# Patient Record
Sex: Female | Born: 1960
Health system: Southern US, Community
[De-identification: ages and names within clinical notes are randomized; demographics above are authoritative.]

## PROBLEM LIST (undated history)

## (undated) DIAGNOSIS — E785 Hyperlipidemia, unspecified: Secondary | ICD-10-CM

## (undated) DIAGNOSIS — Z87440 Personal history of urinary (tract) infections: Secondary | ICD-10-CM

## (undated) DIAGNOSIS — K589 Irritable bowel syndrome without diarrhea: Secondary | ICD-10-CM

## (undated) DIAGNOSIS — T7840XA Allergy, unspecified, initial encounter: Secondary | ICD-10-CM

## (undated) DIAGNOSIS — K2100 Gastro-esophageal reflux disease with esophagitis, without bleeding: Secondary | ICD-10-CM

## (undated) DIAGNOSIS — M8580 Other specified disorders of bone density and structure, unspecified site: Secondary | ICD-10-CM

## (undated) DIAGNOSIS — I10 Essential (primary) hypertension: Secondary | ICD-10-CM

## (undated) DIAGNOSIS — Z9289 Personal history of other medical treatment: Secondary | ICD-10-CM

## (undated) DIAGNOSIS — Z8639 Personal history of other endocrine, nutritional and metabolic disease: Secondary | ICD-10-CM

## (undated) DIAGNOSIS — I2510 Atherosclerotic heart disease of native coronary artery without angina pectoris: Secondary | ICD-10-CM

## (undated) DIAGNOSIS — R011 Cardiac murmur, unspecified: Secondary | ICD-10-CM

## (undated) DIAGNOSIS — K219 Gastro-esophageal reflux disease without esophagitis: Secondary | ICD-10-CM

## (undated) DIAGNOSIS — E10319 Type 1 diabetes mellitus with unspecified diabetic retinopathy without macular edema: Secondary | ICD-10-CM

## (undated) DIAGNOSIS — D649 Anemia, unspecified: Secondary | ICD-10-CM

## (undated) DIAGNOSIS — E119 Type 2 diabetes mellitus without complications: Secondary | ICD-10-CM

## (undated) DIAGNOSIS — N289 Disorder of kidney and ureter, unspecified: Secondary | ICD-10-CM

## (undated) DIAGNOSIS — N200 Calculus of kidney: Secondary | ICD-10-CM

## (undated) HISTORY — DX: Other specified disorders of bone density and structure, unspecified site: M85.80

## (undated) HISTORY — DX: Essential (primary) hypertension: I10

## (undated) HISTORY — DX: Gastro-esophageal reflux disease without esophagitis: K21.9

## (undated) HISTORY — DX: Disorder of kidney and ureter, unspecified: N28.9

## (undated) HISTORY — DX: Personal history of urinary (tract) infections: Z87.440

## (undated) HISTORY — DX: Gastro-esophageal reflux disease with esophagitis, without bleeding: K21.00

## (undated) HISTORY — DX: Irritable bowel syndrome, unspecified: K58.9

## (undated) HISTORY — DX: Allergy, unspecified, initial encounter: T78.40XA

## (undated) HISTORY — DX: Anemia, unspecified: D64.9

## (undated) HISTORY — DX: Hyperlipidemia, unspecified: E78.5

## (undated) HISTORY — PX: APPENDECTOMY: SHX54

## (undated) HISTORY — DX: Calculus of kidney: N20.0

## (undated) HISTORY — DX: Type 1 diabetes mellitus with unspecified diabetic retinopathy without macular edema: E10.319

## (undated) HISTORY — DX: Cardiac murmur, unspecified: R01.1

## (undated) HISTORY — DX: Type 2 diabetes mellitus without complications: E11.9

## (undated) HISTORY — DX: Personal history of other endocrine, nutritional and metabolic disease: Z86.39

---
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
## 1994-11-04 HISTORY — PX: ORIF FEMUR FRACTURE: SHX2119

## 1998-11-04 HISTORY — PX: PARTIAL HYSTERECTOMY: SHX80

## 2003-11-05 DIAGNOSIS — N200 Calculus of kidney: Secondary | ICD-10-CM

## 2003-11-05 HISTORY — DX: Calculus of kidney: N20.0

## 2004-08-13 ENCOUNTER — Emergency Department: Payer: Self-pay | Admitting: Emergency Medicine

## 2004-10-30 ENCOUNTER — Inpatient Hospital Stay: Payer: Self-pay

## 2005-03-17 ENCOUNTER — Inpatient Hospital Stay: Payer: Self-pay | Admitting: Internal Medicine

## 2005-03-17 ENCOUNTER — Other Ambulatory Visit: Payer: Self-pay

## 2008-12-06 ENCOUNTER — Ambulatory Visit: Payer: Self-pay | Admitting: Obstetrics and Gynecology

## 2009-08-18 LAB — HM DIABETES EYE EXAM: HM Diabetic Eye Exam: NORMAL

## 2009-10-18 LAB — CONVERTED CEMR LAB: Pap Smear: NORMAL

## 2009-12-07 ENCOUNTER — Ambulatory Visit: Payer: Self-pay | Admitting: Obstetrics and Gynecology

## 2009-12-19 LAB — HM MAMMOGRAPHY

## 2010-06-25 ENCOUNTER — Ambulatory Visit: Payer: Self-pay | Admitting: Internal Medicine

## 2010-06-25 DIAGNOSIS — E1059 Type 1 diabetes mellitus with other circulatory complications: Secondary | ICD-10-CM | POA: Insufficient documentation

## 2010-06-25 DIAGNOSIS — I10 Essential (primary) hypertension: Secondary | ICD-10-CM | POA: Insufficient documentation

## 2010-06-25 DIAGNOSIS — E103299 Type 1 diabetes mellitus with mild nonproliferative diabetic retinopathy without macular edema, unspecified eye: Secondary | ICD-10-CM

## 2010-06-25 DIAGNOSIS — E1043 Type 1 diabetes mellitus with diabetic autonomic (poly)neuropathy: Secondary | ICD-10-CM | POA: Insufficient documentation

## 2010-06-25 DIAGNOSIS — E1069 Type 1 diabetes mellitus with other specified complication: Secondary | ICD-10-CM | POA: Insufficient documentation

## 2010-06-25 DIAGNOSIS — E785 Hyperlipidemia, unspecified: Secondary | ICD-10-CM

## 2010-06-25 DIAGNOSIS — Z87442 Personal history of urinary calculi: Secondary | ICD-10-CM | POA: Insufficient documentation

## 2010-06-25 DIAGNOSIS — E1065 Type 1 diabetes mellitus with hyperglycemia: Secondary | ICD-10-CM | POA: Insufficient documentation

## 2010-06-25 DIAGNOSIS — K3184 Gastroparesis: Secondary | ICD-10-CM

## 2010-11-04 DIAGNOSIS — M8580 Other specified disorders of bone density and structure, unspecified site: Secondary | ICD-10-CM

## 2010-11-04 HISTORY — DX: Other specified disorders of bone density and structure, unspecified site: M85.80

## 2010-12-04 NOTE — Assessment & Plan Note (Signed)
Summary: NEW PT TO ESTABH/DLO   Vital Signs:  Patient profile:   50 year old female Height:      64 inches Weight:      126 pounds BMI:     21.71 Temp:     98.3 degrees F oral Pulse rate:   74 / minute Pulse rhythm:   regular BP sitting:   112 / 62  (left arm) Cuff size:   regular  Vitals Entered By: Selena Batten Dance CMA Duncan Dull) (June 25, 2010 9:35 AM) CC: New patient to establish care   History of Present Illness: CC: establish  Endocrinologist - Dr. Zenaida Niece in [facility] OB/GYN - Dr. Sharmon Revere OBGYN Podiatrist  T1DM - Lantus 8AM/6PM two times a day, Novolog carb counting scale 30gm carb: 1 unit insulin (takes after meals).  Last A1c 7.1%.  Did have low 42 (2 wks ago), b/c got busy and forgot to eat.  Does have plan for hypoglycemia.    preventative updated and reviewed.  -  Date:  12/19/2009    Mammogram ok per patient  Date:  10/18/2009    PAP normal per patient  Date:  08/18/2009    Diabetes Eye Exam normal per patient  Date:  04/19/2003    Pneumovax done  Current Medications (verified): 1)  Aspirin 81 Mg Tbec (Aspirin) .Marland Kitchen.. 1 By Mouth Once Daily 2)  Crestor 5 Mg Tabs (Rosuvastatin Calcium) .Marland Kitchen.. 1 By Mouth Once Daily 3)  Quinapril Hcl 10 Mg Tabs (Quinapril Hcl) .Marland Kitchen.. 1 By Mouth Once Daily 4)  Novolog 100 Unit/ml Soln (Insulin Aspart) .... Sliding Scale 5)  Lantus Solostar 100 Unit/ml Soln (Insulin Glargine) .... 8 U Am, 6 U Pm 6)  Biotin 1000 Mcg Tabs (Biotin) .Marland Kitchen.. 1 By Mouth Once Daily 7)  Multivitamins  Tabs (Multiple Vitamin) .Marland Kitchen.. 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  Review of Systems       The patient complains of headaches.  The patient denies anorexia, fever, weight loss, weight gain, vision loss, decreased hearing, hoarseness, chest pain, syncope, dyspnea on exertion, peripheral edema, prolonged cough, hemoptysis, abdominal pain, melena, hematochezia, severe indigestion/heartburn, hematuria, incontinence, muscle weakness, suspicious skin lesions, difficulty walking, depression, and breast masses.         + sore legs, only at night when on couch, vit D helped  Physical Exam  General:  Well-developed,well-nourished,in no acute distress; alert,appropriate and cooperative throughout examination.  vitals reviewed and normal. Head:  Normocephalic and atraumatic without obvious abnormalities. No apparent alopecia or balding. Eyes:  No corneal or conjunctival inflammation noted. EOMI. Perrla.  Mouth:  Oral mucosa and oropharynx without lesions or exudates.  Teeth in good repair. Neck:  No deformities, masses, or tenderness noted.  no thyromegaly, no LAD Lungs:  Normal respiratory effort, chest expands symmetrically. Lungs are clear to auscultation, no crackles or wheezes. Heart:  Normal rate and regular rhythm. S1 and S2 normal  without gallop, murmur, click, rub or other extra sounds. Abdomen:  Bowel sounds positive,abdomen soft and non-tender without masses, organomegaly or hernias noted. Msk:  No deformity or scoliosis noted of thoracic or lumbar spine.   Pulses:  2+ periph pulses (rad, DP, PT) Extremities:  No clubbing, cyanosis, edema, or deformity noted with normal full range of motion of all joints.   Neurologic:  No cranial nerve deficits noted. Station and gait are normal. Sensory, motor and coordinative functions appear intact. Skin:   Intact without suspicious lesions or rashes   Impression & Recommendations:  Problem # 1:  DIABETES MELLITUS, TYPE I (ICD-250.01) controlled as evidenced by last A1c 7.1%.  UTD on preventatives.  off pump.  followed by Dr. Vedia Coffer.  due for foot exam next visit.  Her updated medication list for this problem includes:    Aspirin 81 Mg Tbec (Aspirin) .Marland Kitchen... 1 by mouth once daily    Quinapril Hcl 10 Mg Tabs (Quinapril hcl) .Marland Kitchen... 1 by mouth once daily    Novolog 100 Unit/ml Soln (Insulin aspart) ..... Sliding scale    Lantus Solostar 100 Unit/ml Soln (Insulin glargine) .Marland Kitchen... 8 u am, 6 u pm  Last Eye Exam: normal per patient (08/18/2009)  Problem # 2:  GASTROPARESIS (ICD-536.3) stable per patient.  Problem # 3:  HYPERLIPIDEMIA (ICD-272.4) controlled per patient.  to obtain latest blood work. Her updated medication list for this problem includes:    Crestor 5 Mg Tabs (Rosuvastatin calcium) .Marland Kitchen... 1 by mouth once daily  Problem # 4:  ESSENTIAL HYPERTENSION (ICD-401.9) controlled.   Her updated medication list for this problem includes:    Quinapril Hcl 10 Mg Tabs (Quinapril hcl) .Marland Kitchen... 1 by mouth once daily  BP today: 112/62  Problem # 5:  Preventive Health Care (ICD-V70.0) preventative updated.  UTD on pap, mammo.  due for tetanus, says will obtain next visit.  Complete Medication List: 1)  Aspirin 81 Mg Tbec (Aspirin) .Marland Kitchen.. 1 by mouth once daily 2)  Crestor 5 Mg Tabs (Rosuvastatin calcium) .Marland Kitchen.. 1 by mouth once daily 3)  Quinapril Hcl 10 Mg Tabs (Quinapril hcl) .Marland Kitchen.. 1 by mouth once daily 4)  Novolog 100 Unit/ml Soln (Insulin aspart) .... Sliding scale 5)  Lantus Solostar 100 Unit/ml Soln (Insulin glargine) .... 8 u am, 6 u pm 6)  Biotin 1000 Mcg Tabs (Biotin) .Marland Kitchen.. 1 by mouth once daily 7)  Multivitamins Tabs (Multiple vitamin) .Marland Kitchen.. 1 by mouth once daily  Patient Instructions: 1)  Return as needed or in a year. 2)  Tetanus shot next visit. 3)  We will obtain records from Dr. Maia Plan  office for your latest blood work. 4)  Pleasure to meet you today.  Call clinic with quesitons  Current Allergies (reviewed today): No known allergies    Prevention & Chronic Care Immunizations   Influenza vaccine: Not documented    Tetanus booster: Not documented   Td booster deferral: Refused  (06/25/2010)    Pneumococcal vaccine: done  (04/19/2003)   Pneumococcal vaccine due: 04/18/2008  Other Screening   Pap smear: normal per patient  (10/18/2009)   Pap smear due: 10/18/2012    Mammogram: ok per patient  (12/19/2009)   Mammogram due: 12/19/2010   Smoking status: Not documented  Diabetes Mellitus   HgbA1C: Not documented    Eye exam: normal per patient  (08/18/2009)    Foot exam: Not documented   High risk foot: Not documented   Foot care education: Not documented    Urine microalbumin/creatinine  ratio: Not documented    Diabetes flowsheet reviewed?: Yes   Progress toward A1C goal: At goal  Lipids   Total Cholesterol: Not documented   LDL: Not documented   LDL Direct: Not documented   HDL: Not documented   Triglycerides: Not documented    SGOT (AST): Not documented   SGPT (ALT): Not documented   Alkaline phosphatase: Not documented   Total bilirubin: Not documented    Lipid flowsheet reviewed?: Yes   Progress toward LDL goal: Unchanged  Hypertension   Last Blood Pressure: 112 / 62  (06/25/2010)   Serum creatinine: Not documented   Serum potassium Not documented    Hypertension flowsheet reviewed?: Yes   Progress toward BP goal: At goal  Self-Management Support :   Personal Goals (by the next clinic visit) :     Personal A1C goal: 7  (06/25/2010)     Personal blood pressure goal: 130/80  (06/25/2010)     Personal LDL goal: 100  (06/25/2010)    Diabetes self-management support: Not documented    Hypertension self-management support: Not documented    Lipid self-management support: Not documented

## 2010-12-05 HISTORY — PX: TRIGGER FINGER RELEASE: SHX641

## 2010-12-12 ENCOUNTER — Ambulatory Visit: Payer: Self-pay | Admitting: Obstetrics and Gynecology

## 2010-12-24 ENCOUNTER — Ambulatory Visit (HOSPITAL_BASED_OUTPATIENT_CLINIC_OR_DEPARTMENT_OTHER)
Admission: RE | Admit: 2010-12-24 | Discharge: 2010-12-24 | Disposition: A | Discharge status: Home / Self Care | Payer: PRIVATE HEALTH INSURANCE | Attending: Orthopedic Surgery | Admitting: Orthopedic Surgery

## 2010-12-24 DIAGNOSIS — M6530 Trigger finger, unspecified finger: Secondary | ICD-10-CM | POA: Insufficient documentation

## 2010-12-24 DIAGNOSIS — E119 Type 2 diabetes mellitus without complications: Secondary | ICD-10-CM | POA: Insufficient documentation

## 2010-12-24 LAB — POCT I-STAT 4, (NA,K, GLUC, HGB,HCT)
Glucose, Bld: 197 mg/dL — ABNORMAL HIGH (ref 70–99)
HCT: 40 % (ref 36.0–46.0)
Hemoglobin: 13.6 g/dL (ref 12.0–15.0)
Potassium: 4.4 mEq/L (ref 3.5–5.1)
Sodium: 140 mEq/L (ref 135–145)

## 2010-12-24 LAB — GLUCOSE, CAPILLARY: Glucose-Capillary: 143 mg/dL — ABNORMAL HIGH (ref 70–99)

## 2010-12-28 NOTE — Op Note (Signed)
  [name], [name]                ACCOUNT NO.:  [id number]  MEDICAL RECORD NO.:  [id number]          PATIENT TYPE:  AMB  LOCATION:  [facility]                         FACILITY:  [facility]  PHYSICIAN:  Marlowe Kays, M.D.  DATE OF BIRTH:  May 18, 1961  DATE OF PROCEDURE: DATE OF DISCHARGE:                              OPERATIVE REPORT   PREOPERATIVE DIAGNOSIS:  Left trigger thumb.  POSTOPERATIVE DIAGNOSIS:  Left trigger thumb.  OPERATION:  Release A1 pulley, left thumb.  SURGEON:  Marlowe Kays, M.D.  ASSISTANT:  Nurse.  ANESTHESIA:  IV regional.  PATHOLOGICAL INDICATION FOR PROCEDURE:  She is a diabetic, has not responded to steroid injection at the A1 pulley and continues to have pain and catching.  PROCEDURE:  Satisfactory IV regional anesthesia with DuraPrep from midforearm to fingertips was draped in sterile field.  Time-out performed.  I made a horizontal triangle-type incision with the apex on the ulnar side.  The digital nerve was identified on the ulnar side along with the digital vessel and protected.  With a Glorious Peach, I removed soft tissue from over the A1 pulley which I opened slightly with a 15- knife blade and then released the tendon sheath proximal ward.  I then worked distally under direct visualization, incising the A1 pulley and actually removing a small section of it working distally until there was no longer any adhesion to the tendon.  There was a spot on the tendon where it had become stuck down.  At the conclusion of the case, I could place a hemostat, both proximally and distally along the tendon with no visible compression and passively, the tendons glided easily on flexion and extension of the thumb.  The wound was irrigated with sterile saline.  Skin, subcutaneous tissue only closed with interrupted 4-0 nylon mattress sutures. Betadine, Adaptic, dry sterile dressing were applied.  At the time of this dictation, she was on her way to recovery room in  satisfactory condition with no known complications.          ______________________________ Marlowe Kays, M.D.     JA/MEDQ  D:  12/24/2010  T:  12/24/2010  Job:  [id number]  Electronically Signed by Marlowe Kays M.D. on 12/28/2010 04:22:40 PM

## 2011-01-21 ENCOUNTER — Encounter: Payer: Self-pay | Admitting: Family Medicine

## 2011-01-21 ENCOUNTER — Ambulatory Visit (INDEPENDENT_AMBULATORY_CARE_PROVIDER_SITE_OTHER): Payer: PRIVATE HEALTH INSURANCE | Admitting: Family Medicine

## 2011-01-21 DIAGNOSIS — J019 Acute sinusitis, unspecified: Secondary | ICD-10-CM

## 2011-01-31 NOTE — Assessment & Plan Note (Signed)
Summary: ?SINUS INFECTION/CLE  WELLPATH   Vital Signs:  Patient profile:   50 year old female Weight:      121.25 pounds Temp:     98.1 degrees F oral Pulse rate:   84 / minute Pulse rhythm:   regular BP sitting:   132 / 78  (left arm) Cuff size:   regular  Vitals Entered By: Selena Batten Dance CMA Duncan Dull) (January 21, 2011 11:17 AM) CC: ? Sinus infection   History of Present Illness: CC: Sinus infx?  5d h/o pressure in throat, ears, ST, 3d later feeling much worse.  Lots of drainage down throat.  Lots of coughing worse at night.  As long as taking advil, throat tolerable.  When blowing nose, mostly clear, some yellow mucous.  Has tried advil cold/sinus and chloraseptic spray.  Unable to breath out of right side.  No sinus pressure.  No HA.  more congestion with bending head forward.  doesn't tend to get sinus infections.  This has been going around office.    No smokers at home.    h/o T1DM, sugars have been running elevated in high 100s since ill.  Current Medications (verified): 1)  Aspirin 81 Mg Tbec (Aspirin) .Marland Kitchen.. 1 By Mouth Once Daily 2)  Crestor 5 Mg Tabs (Rosuvastatin Calcium) .Marland Kitchen.. 1 By Mouth Once Daily 3)  Quinapril Hcl 10 Mg Tabs (Quinapril Hcl) .Marland Kitchen.. 1 By Mouth Once Daily 4)  Novolog 100 Unit/ml Soln (Insulin Aspart) .... Sliding Scale 5)  Lantus Solostar 100 Unit/ml Soln (Insulin Glargine) .... 8 U Am, 6 U Pm 6)  Biotin 1000 Mcg Tabs (Biotin) .Marland Kitchen.. 1 By Mouth Once Daily 7)  Multivitamins  Tabs (Multiple Vitamin) .Marland Kitchen.. 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  Past  Surgical History: T&A (1975) C/S (1985) L leg fx repaired (1996) total hysterectomy (2000) (paps by [facility]) L thumb trigger finger release (12/2010)  Review of Systems       per HPI  Physical Exam  General:  Well-developed,well-nourished,in no acute distress; alert,appropriate and cooperative throughout examination.  vitals reviewed and normal. Head:  Normocephalic and atraumatic without obvious abnormalities. No apparent alopecia or balding.  mild sinus tenderness maxillary >frontal Eyes:  No corneal or conjunctival inflammation noted. EOMI. Perrla.  Ears:  TMs clear bilaterally Nose:  nares congested bilaterally Mouth:  MMM, no pharyngeal erythema/exudates Neck:  No deformities, masses, or tenderness noted.  no thyromegaly, no LAD Lungs:  Normal respiratory effort, chest expands symmetrically. Lungs are clear to auscultation, no crackles or wheezes. Heart:  Normal rate and regular rhythm. S1 and S2 normal without gallop, murmur, click, rub or other extra sounds. Pulses:  2+ rad pulses    Impression & Recommendations:  Problem # 1:  SINUSITIS, ACUTE (ICD-461.9) likely viral.  supportive care as instructions, flonase for inflammation.  zpack to hold on to in case worsening or not improving as expected given comorbidity of diabetes.  Her updated medication list for this problem includes:    Zithromax Z-pak 250 Mg Tabs (Azithromycin) ..... Use as directed    Flonase 50 Mcg/act  Susp (Fluticasone propionate) .Marland Kitchen... 2 squirts in each nostril daily  Complete Medication List: 1)  Aspirin 81 Mg Tbec (Aspirin) .Marland Kitchen.. 1 by mouth once daily 2)  Crestor 5 Mg Tabs (Rosuvastatin calcium) .Marland Kitchen.. 1 by mouth once daily 3)  Quinapril Hcl 10 Mg Tabs (Quinapril hcl) .Marland Kitchen.. 1 by mouth once daily 4)  Novolog 100 Unit/ml Soln (Insulin aspart) .... Sliding scale 5)  Lantus Solostar 100 Unit/ml Soln (Insulin glargine) .... 8 u am, 6 u pm 6)  Biotin 1000 Mcg Tabs (Biotin) .Marland Kitchen.. 1 by mouth once  daily 7)  Multivitamins Tabs (Multiple vitamin) .Marland Kitchen.. 1 by mouth once daily 8)  Zithromax Z-pak 250 Mg Tabs (Azithromycin) .... Use as directed 9)  Flonase 50 Mcg/act Susp (Fluticasone propionate) .... 2 squirts in each nostril daily  Patient Instructions: 1)  You have a sinus infection, likely viral. 2)  Take medicines as prescribed: azihtormycin to hold on to in case not improving. 3)  Take guaifenesin 400mg  IR 1 1/2 pills in am and at noon with plenty of fluid to help mobilize mucous. 4)  Use nasal saline spray or neti pot to help drainage of sinuses. 5)  nasal steroid for next 1-2 wks.  flonase 2 squrits into each nostril daily.  usually takes about 1 wk to kick in. 6)  If you start having fevers >101.5, trouble swallowing or breathing, or are worsening instead of improving as expected, you may need to be seen again. 7)  Good to see you today, call clinic with questions.  Prescriptions: FLONASE 50 MCG/ACT SUSP (FLUTICASONE PROPIONATE) 2 squirts in each nostril daily  #1 x 3   Entered and Authorized by:   [name]  MD   Signed by:   [name]  MD on 01/21/2011   Method used:   Electronically to        [id number] (retail)       [address] [place], [place]  [address]       Ph: [phone] or [phone]       Fax: [phone]   RxID:   [id number] ZITHROMAX Z-PAK 250 MG TABS (AZITHROMYCIN) use as directed  #1 x 0   Entered and Authorized by:   [name]  MD   Signed by:   [name]  MD on 01/21/2011   Method used:   Print then Give to Patient   RxID:   [id number]    Orders Added: 1)  Est. Patient Level III [87564]    Current Allergies (reviewed today): No known allergies

## 2011-04-18 ENCOUNTER — Ambulatory Visit (INDEPENDENT_AMBULATORY_CARE_PROVIDER_SITE_OTHER): Payer: PRIVATE HEALTH INSURANCE | Admitting: Family Medicine

## 2011-04-18 ENCOUNTER — Encounter: Payer: Self-pay | Admitting: Family Medicine

## 2011-04-18 VITALS — BP 110/70 | HR 76 | Temp 97.7°F | Wt 122.1 lb

## 2011-04-18 DIAGNOSIS — H9201 Otalgia, right ear: Secondary | ICD-10-CM

## 2011-04-18 DIAGNOSIS — H9209 Otalgia, unspecified ear: Secondary | ICD-10-CM

## 2011-04-18 MED ORDER — AMOXICILLIN 500 MG PO TABS: 500.0000 mg | ORAL_TABLET | Freq: Two times a day (BID) | ORAL | Status: AC

## 2011-04-18 NOTE — Progress Notes (Signed)
  Subjective:    Patient ID: Felicia [name], female    DOB: 11/12/1960, 50 y.o.   MRN: [id number]  HPI CC: R ear pain  Pleasant T1DM with 1d h/o R ear pain, worse with touching (inside and out).  Feels swollen.  Tried tylenol which helps dull pain.  Tried warm compresses which helps.  Feels like "cotton stuffed inside ear".  No draining from hear.  No fevers/chills, no hearing loss, no tinnitus.  No nausea, dizziness.    No recent swimming.no recent travel on airplane.  States sugars controlled.  Review of Systems Per HPI    Objective:   Physical Exam  Nursing note and vitals reviewed. Constitutional: She appears well-developed and well-nourished. No distress.  HENT:  Head: Normocephalic and atraumatic.  Right Ear: Hearing and external ear normal. Tympanic membrane is not perforated.  Left Ear: Hearing, tympanic membrane, external ear and ear canal normal.  Nose: Nose normal.  Mouth/Throat: Oropharynx is clear and moist. No oropharyngeal exudate.       R TM - some erythema of vessels, dull reflex, no insufflation, retracted, cloudy yellow fluid behind tm.  External canal with mild edema, no irritation/maceration  Eyes: Conjunctivae and EOM are normal. Pupils are equal, round, and reactive to light. No scleral icterus.  Neck: Normal range of motion. Neck supple. No thyromegaly present.  Lymphadenopathy:       Head (right side): No submental, no submandibular, no tonsillar, no preauricular, no posterior auricular and no occipital adenopathy present.       Head (left side): No submental, no submandibular, no tonsillar, no preauricular, no posterior auricular and no occipital adenopathy present.    She has no cervical adenopathy.          Assessment & Plan:

## 2011-04-18 NOTE — Patient Instructions (Signed)
Your right ear drum looks irritated, possible early infection. Treat with antibiotic twice daily for 7 days Tylenol for discomfort. Push water and rest. Update [place] if not improving as expected. Good to see you today.

## 2011-04-18 NOTE — Assessment & Plan Note (Signed)
Possible early R AOM with effusion. Treat with amox bid x 7 days. Tylenol for pain Update [place] if not improved.

## 2011-07-03 ENCOUNTER — Encounter: Payer: Self-pay | Admitting: Family Medicine

## 2011-07-03 ENCOUNTER — Ambulatory Visit (INDEPENDENT_AMBULATORY_CARE_PROVIDER_SITE_OTHER): Payer: PRIVATE HEALTH INSURANCE | Admitting: Family Medicine

## 2011-07-03 VITALS — BP 124/74 | HR 76 | Temp 97.9°F | Wt 124.5 lb

## 2011-07-03 DIAGNOSIS — M79604 Pain in right leg: Secondary | ICD-10-CM | POA: Insufficient documentation

## 2011-07-03 DIAGNOSIS — R252 Cramp and spasm: Secondary | ICD-10-CM

## 2011-07-03 LAB — BASIC METABOLIC PANEL
BUN: 17 mg/dL (ref 6–23)
CO2: 25 mEq/L (ref 19–32)
Calcium: 9.1 mg/dL (ref 8.4–10.5)
Chloride: 102 mEq/L (ref 96–112)
Creat: 0.9 mg/dL (ref 0.50–1.10)
Glucose, Bld: 165 mg/dL — ABNORMAL HIGH (ref 70–99)
Potassium: 5.1 mEq/L (ref 3.5–5.3)
Sodium: 138 mEq/L (ref 135–145)

## 2011-07-03 LAB — MAGNESIUM: Magnesium: 2 mg/dL (ref 1.5–2.5)

## 2011-07-03 LAB — VITAMIN B12: Vitamin B-12: 633 pg/mL (ref 211–911)

## 2011-07-03 LAB — CK: Total CK: 67 U/L (ref 7–177)

## 2011-07-03 NOTE — Assessment & Plan Note (Signed)
No evidence of DVT on exam today. Does have calf tenderness to palpation. Check K, Mg and CK (on statin).  If normal, check dopplers to help r/o DVT. If normal, rec start tonic water or jogging in a jug. No sxs claudication or skin changes suggestive of CVI.

## 2011-07-03 NOTE — Progress Notes (Signed)
Addended by: Liane Comber C on: 07/03/2011 11:07 AM   Modules accepted: Orders

## 2011-07-03 NOTE — Patient Instructions (Signed)
We will check blood work to day - potassium and muscle function. If normal, we will set you up with ultrasound of the legs. If normal, recommend trial of tonic water at night or "jogging in a jug".

## 2011-07-03 NOTE — Progress Notes (Signed)
  Subjective:    Patient ID: Felicia [name], female    DOB: [birth year], 50 y.o.   MRN: [id number]  HPI CC: leg pain  Going on for years.  Started more when sitting in car for prolonged period.  Feels charlie horse sensation/cramp in calves.  R>L.  Worse in evenings when sitting down to eat dinner.  Legs start cramping, feels like has to keep moving legs.  Very achey first thing in am.  Asper cream improves.  Walking on treadmill also helped some.  Has tried different shoes, better arch support, hasn't helped.  Becoming more constant pain.  No fevers/chills, no leg swelling or redness.  No claudication sxs.  Not on hormonal history.  Mother with h/o blood clot, but no personal hx.  No prolonged period of immobility.  Review of Systems Per HPI    Objective:   Physical Exam  Nursing note and vitals reviewed. Constitutional: She appears well-developed and well-nourished. No distress.  HENT:  Head: Normocephalic and atraumatic.  Cardiovascular:  Pulses:      Dorsalis pedis pulses are 2+ on the right side, and 2+ on the left side.       Posterior tibial pulses are 2+ on the right side, and 2+ on the left side.  Musculoskeletal: Normal range of motion. She exhibits no edema.  Skin: Skin is warm and dry. No rash noted.       R calf tender to palpation but no palp cords. R calf measures 31.5cm circ, L calf measures 31cm circ. Neg homan sign. No erythema, warmth, redness or abnl skin changes of BLE.  Psychiatric: She has a normal mood and affect.         Assessment & Plan:

## 2011-07-04 ENCOUNTER — Telehealth: Payer: Self-pay | Admitting: Family Medicine

## 2011-07-04 DIAGNOSIS — R252 Cramp and spasm: Secondary | ICD-10-CM

## 2011-07-04 LAB — VITAMIN D 25 HYDROXY (VIT D DEFICIENCY, FRACTURES): Vit D, 25-Hydroxy: 24 ng/mL — ABNORMAL LOW (ref 30–89)

## 2011-07-04 MED ORDER — CHOLECALCIFEROL 25 MCG (1000 UT) PO CAPS: 1000.0000 [IU] | ORAL_CAPSULE | Freq: Every day | ORAL | Status: AC

## 2011-07-04 NOTE — Telephone Encounter (Signed)
Patient notified and will begin OTC vitamin D. She will await call from [facility] for doppler appt.

## 2011-07-04 NOTE — Telephone Encounter (Signed)
Please notify vitamin D level somewhat low - recommend 1000 IU vit D daily to improve levels. Muscle function, potassium, vit B12 all normal. Will set patient up with lower extremity dopplers to rule out blood clots.  Will route to [place] as well.

## 2011-07-05 NOTE — Telephone Encounter (Signed)
Spoke to patient to schedule the dopplers. Patient calling her insurance co to find out where she can go.

## 2011-07-15 ENCOUNTER — Other Ambulatory Visit: Payer: Self-pay | Admitting: Cardiology

## 2011-07-15 DIAGNOSIS — M79609 Pain in unspecified limb: Secondary | ICD-10-CM

## 2011-07-16 ENCOUNTER — Encounter (INDEPENDENT_AMBULATORY_CARE_PROVIDER_SITE_OTHER): Payer: PRIVATE HEALTH INSURANCE | Admitting: *Deleted

## 2011-07-16 DIAGNOSIS — M79609 Pain in unspecified limb: Secondary | ICD-10-CM

## 2011-07-16 DIAGNOSIS — M7989 Other specified soft tissue disorders: Secondary | ICD-10-CM

## 2011-07-25 ENCOUNTER — Encounter: Payer: Self-pay | Admitting: Family Medicine

## 2011-07-25 ENCOUNTER — Ambulatory Visit (INDEPENDENT_AMBULATORY_CARE_PROVIDER_SITE_OTHER): Payer: PRIVATE HEALTH INSURANCE | Admitting: Family Medicine

## 2011-07-25 VITALS — BP 118/74 | HR 80 | Temp 97.9°F | Wt 126.5 lb

## 2011-07-25 DIAGNOSIS — R109 Unspecified abdominal pain: Secondary | ICD-10-CM

## 2011-07-25 LAB — COMPREHENSIVE METABOLIC PANEL
ALT: 18 U/L (ref 0–35)
AST: 22 U/L (ref 0–37)
Albumin: 3.8 g/dL (ref 3.5–5.2)
Alkaline Phosphatase: 64 U/L (ref 39–117)
BUN: 13 mg/dL (ref 6–23)
CO2: 27 mEq/L (ref 19–32)
Calcium: 8.8 mg/dL (ref 8.4–10.5)
Chloride: 106 mEq/L (ref 96–112)
Creatinine, Ser: 0.8 mg/dL (ref 0.4–1.2)
GFR: 87.5 mL/min (ref >60.00)
Glucose, Bld: 107 mg/dL — ABNORMAL HIGH (ref 70–99)
Potassium: 4.8 mEq/L (ref 3.5–5.1)
Sodium: 139 mEq/L (ref 135–145)
Total Bilirubin: 0.6 mg/dL (ref 0.3–1.2)
Total Protein: 6.6 g/dL (ref 6.0–8.3)

## 2011-07-25 LAB — CBC WITH DIFFERENTIAL/PLATELET
Basophils Absolute: 0 10*3/uL (ref 0.0–0.1)
Basophils Relative: 0.9 % (ref 0.0–3.0)
Eosinophils Absolute: 0.2 10*3/uL (ref 0.0–0.7)
Eosinophils Relative: 5.4 % — ABNORMAL HIGH (ref 0.0–5.0)
HCT: 36.9 % (ref 36.0–46.0)
Hemoglobin: 12.3 g/dL (ref 12.0–15.0)
Lymphocytes Relative: 33.2 % (ref 12.0–46.0)
Lymphs Abs: 1.4 10*3/uL (ref 0.7–4.0)
MCHC: 33.4 g/dL (ref 30.0–36.0)
MCV: 90.4 fl (ref 78.0–100.0)
Monocytes Absolute: 0.4 10*3/uL (ref 0.1–1.0)
Monocytes Relative: 8.3 % (ref 3.0–12.0)
Neutro Abs: 2.3 10*3/uL (ref 1.4–7.7)
Neutrophils Relative %: 52.2 % (ref 43.0–77.0)
Platelets: 207 10*3/uL (ref 150.0–400.0)
RBC: 4.08 Mil/uL (ref 3.87–5.11)
RDW: 13.4 % (ref 11.5–14.6)
WBC: 4.4 10*3/uL — ABNORMAL LOW (ref 4.5–10.5)

## 2011-07-25 LAB — LIPASE: Lipase: 25 U/L (ref 11.0–59.0)

## 2011-07-25 LAB — POCT URINALYSIS DIPSTICK
Bilirubin, UA: NEGATIVE
Blood, UA: NEGATIVE
Glucose, UA: NEGATIVE
Ketones, UA: NEGATIVE
Leukocytes, UA: NEGATIVE
Nitrite, UA: NEGATIVE
Protein, UA: NEGATIVE
Spec Grav, UA: 1.01
Urobilinogen, UA: 0.2
pH, UA: 7.5

## 2011-07-25 MED ORDER — CYCLOBENZAPRINE HCL 5 MG PO TABS: 5.0000 mg | ORAL_TABLET | Freq: Three times a day (TID) | ORAL | Status: AC | PRN

## 2011-07-25 NOTE — Progress Notes (Signed)
  Subjective:    Patient ID: Felicia [name], female    DOB: 1961-05-26, 50 y.o.   MRN: [id number]  HPI CC: R flank pain  3 d h/o R flank pain.  No inciting factor.  ibuprofen helps some.  May have had fever yesterday at work.  + constipated as well as diarrhea initially.  Pain characterized as "someone punching me in the back".  Has had kidney infections and kidney stones in past.  No radiation of pain.  Has been told has R abdominal kidney.  Sugars have been 300, not coming down with increase of insulin.  T1DM, followed by endo.  Feeling more cranky.  No chills, nausea/vomiting, dysuria, urgency, frequency, hematuria.  Works at [employer].  Last A1c 7.6% 25mo ago.  Has endo appt Tuesday.  Review of Systems Per HPI    Objective:   Physical Exam  Nursing note and vitals reviewed. Constitutional: She appears well-developed and well-nourished. No distress.  HENT:  Head: Normocephalic and atraumatic.  Mouth/Throat: Oropharynx is clear and moist. No oropharyngeal exudate.  Eyes: Conjunctivae and EOM are normal. Pupils are equal, round, and reactive to light. No scleral icterus.  Neck: Normal range of motion. Neck supple.  Cardiovascular: Normal rate, regular rhythm and intact distal pulses.   Murmur (mild SEM) heard. Pulmonary/Chest: Effort normal and breath sounds normal. No respiratory distress. She has no wheezes. She has no rales.  Abdominal: Soft. Bowel sounds are normal. She exhibits no distension and no mass. There is no hepatosplenomegaly. There is no tenderness. There is CVA tenderness. There is no rigidity, no rebound, no guarding and negative Murphy's sign.       R flank pain to palpation. No rash  Musculoskeletal: She exhibits no edema.       Lumbar back: Normal.       No midline spinal pain. Pain right flank region.  Skin: Skin is warm and dry. No rash noted.  Psychiatric: She has a normal mood and affect.          Assessment & Plan:

## 2011-07-25 NOTE — Patient Instructions (Addendum)
Urine looking clear today. Treat as muscle strain with ibuprofen (up to 600mg  at a time, 3 times a day), flexeril for muscle relaxant (can make you sleepy), and alternate ice/heat to area. Blood work today. If not improving with above treatment, please let us know. If worsening pain, please seek urgent care. Call us Monday with update, sooner if worsening.

## 2011-07-25 NOTE — Assessment & Plan Note (Signed)
In h/o kidney infection and stone.  UA normal today, nontoxic, no fever points against both of these etiologies. Check blood work to r/o infection, other abdominal process. Sensitive R flank, seems positional.   Treat as msk strain with NSAIDs and flexeril, ice/heat. Update if any worsening.  If we're closed, to seek urgent care. Advised to call us Monday with update.  If continued, obtain CT to r/o intraabdominal pathology (ie renal abscess, retroperitoneal hematoma, etc)

## 2011-08-01 ENCOUNTER — Telehealth: Payer: Self-pay | Admitting: *Deleted

## 2011-08-01 DIAGNOSIS — R109 Unspecified abdominal pain: Secondary | ICD-10-CM

## 2011-08-01 NOTE — Telephone Encounter (Signed)
Has ibuprofen not helped either?  Any more fever, or any nausea/vomiting or other sxs? I wanted to get CT scan to evaluate right kidney - xray will not evaluate kidney.  Will place order in chart.

## 2011-08-01 NOTE — Telephone Encounter (Signed)
Spoke with patient. She said ibuprofen has been no help either. She was asking about an xray because she said whenever she would apply pressure to her hip/pelvic bone (at waist line), the pain would be reproduced, so she thought it was more of a bone issue. She said she would come back in to talk to you if you wanted her to.

## 2011-08-01 NOTE — Telephone Encounter (Signed)
Have her come back in.  Thanks.

## 2011-08-01 NOTE — Telephone Encounter (Signed)
Patient called and said she isn't feeling any better. She said the flexeril helped a little at first, but now the pain is back and it's not helping anymore. She said it is like a "pounding" above her hip area that goes up the middle of her back and down into her tailbone. She said you had mentioned an MRI, but she was asking if an xray would be a better place to start. She denied any bowel or bladder issues, no numbness/weakness and no swelling. She can be reached at [phone]

## 2011-08-02 NOTE — Telephone Encounter (Signed)
Patient notified and appt scheduled for Monday. 

## 2011-08-05 ENCOUNTER — Ambulatory Visit: Payer: PRIVATE HEALTH INSURANCE | Admitting: Family Medicine

## 2011-08-06 ENCOUNTER — Telehealth: Payer: Self-pay | Admitting: Family Medicine

## 2011-08-06 NOTE — Telephone Encounter (Signed)
Spoke with patient RE; CT Scan. Patient wants you to cancel this at this time does not want to have Cat Scan. Please cancel order in Epic. Thanks, Shirlee Limerick

## 2011-08-07 NOTE — Telephone Encounter (Signed)
Have cancelled. Can we call pt for update on flank pain?  Any more fevers?

## 2011-08-07 NOTE — Telephone Encounter (Signed)
Message left for patient to return my call.  

## 2011-08-09 NOTE — Telephone Encounter (Signed)
Message left for patient to return my call.  

## 2011-08-15 NOTE — Telephone Encounter (Signed)
Message left for patient to return my call and advise on her status. Will await return call.

## 2011-12-18 ENCOUNTER — Ambulatory Visit: Payer: Self-pay | Admitting: Obstetrics and Gynecology

## 2012-09-15 ENCOUNTER — Encounter: Payer: Self-pay | Admitting: Family Medicine

## 2012-09-15 ENCOUNTER — Ambulatory Visit (INDEPENDENT_AMBULATORY_CARE_PROVIDER_SITE_OTHER): Payer: 59 | Admitting: Family Medicine

## 2012-09-15 VITALS — BP 138/78 | HR 84 | Temp 97.6°F | Wt 124.2 lb

## 2012-09-15 DIAGNOSIS — J069 Acute upper respiratory infection, unspecified: Secondary | ICD-10-CM

## 2012-09-15 MED ORDER — LORATADINE-PSEUDOEPHEDRINE ER 10-240 MG PO TB24: 1.0000 | ORAL_TABLET | Freq: Every day | ORAL | Status: DC

## 2012-09-15 NOTE — Assessment & Plan Note (Signed)
T1 diabetic, latest A1c 7.2%. Anticipate viral uri with cough.  Discussed this. Supportive care as per instructions. rec start with claritin or zyrtec, NSAID. Update if worsening or not improving as expected. Discussed red flags for concern for bacterial infection after viral infection. Declines cough syrup.

## 2012-09-15 NOTE — Patient Instructions (Addendum)
Sounds like you have a viral upper respiratory infection. Antibiotics are not needed for this.  Viral infections usually take 7-10 days to resolve.  The cough can last several weeks to go away. May try claritin D over the counter - sent in prescription.  Try regular claritin first given high blood pressure Use nasal saline for congestion Push fluids and plenty of rest. Please return if you are not improving as expected, or if you have high fevers (>101.5) or difficulty swallowing or worsening productive cough. Call clinic with questions.  Good to see you today.

## 2012-09-15 NOTE — Progress Notes (Signed)
  Subjective:    Patient ID: JUNIOR KENEDY, female    DOB: 06-25-1961, 51 y.o.   MRN: [id number]  HPI CC: uri?  1d h/o ST, progressing to ear pressure, PNDrainage and chills.  Lots of sinus congestion.    Has tried home remedy.  Taking chloraseptic spray and tylenol.    No fevers, abd pain, HA, coughing, chest congestion.  No bowel changes.  Granddaughter sick recently.  14 mo old, told viral URI. No smokers at home. No h/o asthma.  Did receive flu shot this year.  Last A1 7.2%.  Past Medical History  Diagnosis Date  . Diabetes mellitus type I     dx at 51 y/o  . History of diabetic gastroparesis   . HLD (hyperlipidemia)     borderline  . HTN (hypertension)     borderline  . Nephrolithiasis 2005  . Hx: UTI (urinary tract infection)   . Disorder of kidney     has abd kidney (right)    Review of Systems Per HPI    Objective:   Physical Exam  Nursing note and vitals reviewed. Constitutional: She appears well-developed and well-nourished. No distress.  HENT:  Head: Normocephalic and atraumatic.  Right Ear: Hearing, tympanic membrane, external ear and ear canal normal.  Left Ear: Hearing, tympanic membrane, external ear and ear canal normal.  Nose: Mucosal edema present. No rhinorrhea. Right sinus exhibits no maxillary sinus tenderness and no frontal sinus tenderness. Left sinus exhibits no maxillary sinus tenderness and no frontal sinus tenderness.  Mouth/Throat: Uvula is midline and mucous membranes are normal. Posterior oropharyngeal edema and posterior oropharyngeal erythema present. No oropharyngeal exudate or tonsillar abscesses.       Mild TM congestion Nasal congestion, mucosal irritation  Eyes: Conjunctivae normal and EOM are normal. Pupils are equal, round, and reactive to light. No scleral icterus.  Neck: Normal range of motion. Neck supple.  Cardiovascular: Normal rate, regular rhythm, normal heart sounds and intact distal pulses.   No murmur  heard. Pulmonary/Chest: Effort normal and breath sounds normal. No respiratory distress. She has no wheezes. She has no rales.  Musculoskeletal: She exhibits no edema.  Lymphadenopathy:    She has no cervical adenopathy.  Skin: Skin is warm and dry. No rash noted.       Assessment & Plan:

## 2012-09-18 ENCOUNTER — Ambulatory Visit: Payer: 59 | Admitting: Family Medicine

## 2012-10-19 ENCOUNTER — Encounter: Payer: Self-pay | Admitting: Family Medicine

## 2012-10-19 ENCOUNTER — Ambulatory Visit (INDEPENDENT_AMBULATORY_CARE_PROVIDER_SITE_OTHER): Payer: 59 | Admitting: Family Medicine

## 2012-10-19 VITALS — BP 110/70 | HR 80 | Temp 97.5°F | Wt 119.5 lb

## 2012-10-19 DIAGNOSIS — R197 Diarrhea, unspecified: Secondary | ICD-10-CM | POA: Insufficient documentation

## 2012-10-19 MED ORDER — PROMETHAZINE HCL 25 MG PO TABS: 25.0000 mg | ORAL_TABLET | Freq: Three times a day (TID) | ORAL | Status: DC | PRN

## 2012-10-19 MED ORDER — DIPHENOXYLATE-ATROPINE 2.5-0.025 MG PO TABS: 1.0000 | ORAL_TABLET | Freq: Four times a day (QID) | ORAL | Status: DC | PRN

## 2012-10-19 MED ORDER — CIPROFLOXACIN HCL 500 MG PO TABS: 500.0000 mg | ORAL_TABLET | Freq: Two times a day (BID) | ORAL | Status: DC

## 2012-10-19 NOTE — Progress Notes (Signed)
  Subjective:    Patient ID: Felicia [name], female    DOB: 1961-06-24, 51 y.o.   MRN: [id number]  HPI CC: diarrhea  51 yo with h/o T1DM and gastroparesis presents with 1 wk h/o diarrhea with cramping, along with gassiness.  Progressively worsening.  Yesterday with chills and nausea.  Diarrhea after every meal.  Appetite down.  Diarrhea started watery/green, progressing to brown loose.  No blood in stool.  Voiding well.  Immodium has not helped.  1yo granddaughter ill recently.  No recent traveling.  Not eating out recently.  Diarrhea 10-11 times daily.  Having night time accidents.   Past Medical History  Diagnosis Date  . Diabetes mellitus type I     dx at 51 y/o  . History of diabetic gastroparesis   . HLD (hyperlipidemia)     borderline  . HTN (hypertension)     borderline  . Nephrolithiasis 2005  . Hx: UTI (urinary tract infection)   . Disorder of kidney     has abd kidney (right)     Review of Systems Per HPI    Objective:   Physical Exam  Nursing note and vitals reviewed. Constitutional: She appears well-developed and well-nourished. No distress.  HENT:  Mouth/Throat: Oropharynx is clear and moist. No oropharyngeal exudate.       Slightly dry MM  Cardiovascular: Normal rate, regular rhythm, normal heart sounds and intact distal pulses.   Pulmonary/Chest: Effort normal and breath sounds normal. No respiratory distress. She has no wheezes. She has no rales.  Abdominal: Soft. She exhibits no distension and no mass. Bowel sounds are increased. There is no hepatosplenomegaly. There is tenderness in the suprapubic area. There is CVA tenderness. There is no rigidity, no rebound, no guarding and negative Murphy's sign.  Skin: Skin is warm and dry. No rash noted.      Assessment & Plan:

## 2012-10-19 NOTE — Patient Instructions (Addendum)
Take phenergan for nausea as needed - may make you sleepy. Use lomotil for diarrhea. If not improving in 1-2 days, may fill cipro antibiotic twice daily for 3 days. Push fluids, plenty of rest. Update [place] if fever >101, worsening diarrhea or worsening abdominal pain.

## 2012-10-19 NOTE — Assessment & Plan Note (Signed)
Initially thought viral gastroenteritis, however prolonged for just viral.  ?bacterial colitis/enteritis. Will treat sxs with phenergan and lomotil. If prolonged sxs, recommend start taking cipro for 3 d course. Red flags to return discussed.

## 2012-12-04 ENCOUNTER — Ambulatory Visit: Payer: 59 | Admitting: Family Medicine

## 2012-12-07 ENCOUNTER — Encounter: Payer: Self-pay | Admitting: Family Medicine

## 2012-12-07 ENCOUNTER — Ambulatory Visit (INDEPENDENT_AMBULATORY_CARE_PROVIDER_SITE_OTHER): Payer: Medicare Other | Admitting: Family Medicine

## 2012-12-07 VITALS — BP 142/84 | HR 84 | Temp 98.3°F

## 2012-12-07 DIAGNOSIS — I10 Essential (primary) hypertension: Secondary | ICD-10-CM

## 2012-12-07 DIAGNOSIS — R011 Cardiac murmur, unspecified: Secondary | ICD-10-CM

## 2012-12-07 NOTE — Patient Instructions (Addendum)
Good to see you today, call us with questions. We will call you for referral for heart ultrasound to further evaluate this murmur. Keep an eye on blood pressure - let me know if consistently >135/85.

## 2012-12-07 NOTE — Progress Notes (Signed)
  Subjective:    Patient ID: Felicia [name], female    DOB: 06-Apr-1961, 52 y.o.   MRN: [id number]  HPI CC: heart murmur  Pleasant 51yo T1DM.  Saw endo for yearly checkup, told had heart murmur so presents today for further evaluation.  States DM running well.  Has been changing around insulin per endo.  BP Readings from Last 3 Encounters:  12/07/12 142/84  10/19/12 110/70  09/15/12 138/78  Elevated BP attributed to stress today (presenting with granddaughter who she has to take to doctor next).  Endorses occasional dyspnea, attributed in past to allergic rhinitis.  Endorses multiple episodes of strep pharyngitis growing up, not rheumatic fever that she knows of.  Denies chest pain, palpitations, dizziness, HA, fevers/chills, weight changes. Wt Readings from Last 3 Encounters:  10/19/12 119 lb 8 oz (54.205 kg)  09/15/12 124 lb 4 oz (56.359 kg)  07/25/11 126 lb 8 oz (57.38 kg)   Past Medical History  Diagnosis Date  . Diabetes mellitus type I     dx at 52 y/o  . History of diabetic gastroparesis   . HLD (hyperlipidemia)     borderline  . HTN (hypertension)     borderline  . Nephrolithiasis 2005  . Hx: UTI (urinary tract infection)   . Disorder of kidney     has abd kidney (right)   Review of Systems Per HPI    Objective:   Physical Exam  Nursing note and vitals reviewed. Constitutional: She appears well-developed and well-nourished. No distress.  HENT:  Head: Normocephalic and atraumatic.  Mouth/Throat: Oropharynx is clear and moist. No oropharyngeal exudate.  Neck: Carotid bruit is not present.  Cardiovascular: Normal rate, regular rhythm and intact distal pulses.   Murmur (3/6 mid systolic murmur best at LUSB) heard. Pulmonary/Chest: Effort normal and breath sounds normal. No respiratory distress. She has no wheezes. She has no rales. She exhibits no tenderness.  Musculoskeletal: She exhibits no edema.       2+ rad pulses.  No splinter hemorrhages        Assessment & Plan:

## 2012-12-07 NOTE — Assessment & Plan Note (Signed)
Advised to monitor bp at work and notify me if consistently staying elevated.

## 2012-12-07 NOTE — Assessment & Plan Note (Signed)
Louder than when last heard 07/2011. Will obtain echo to further evaluate. Pt agrees with plan.

## 2013-01-02 DIAGNOSIS — R011 Cardiac murmur, unspecified: Secondary | ICD-10-CM

## 2013-01-02 HISTORY — DX: Cardiac murmur, unspecified: R01.1

## 2013-01-05 ENCOUNTER — Other Ambulatory Visit: Payer: Medicare Other

## 2013-01-19 ENCOUNTER — Other Ambulatory Visit: Payer: Self-pay

## 2013-01-19 ENCOUNTER — Other Ambulatory Visit (INDEPENDENT_AMBULATORY_CARE_PROVIDER_SITE_OTHER): Payer: Medicare Other

## 2013-01-19 DIAGNOSIS — R011 Cardiac murmur, unspecified: Secondary | ICD-10-CM

## 2013-01-21 ENCOUNTER — Ambulatory Visit: Payer: Self-pay | Admitting: Obstetrics and Gynecology

## 2013-01-28 ENCOUNTER — Other Ambulatory Visit: Payer: Medicare Other

## 2013-04-22 ENCOUNTER — Encounter: Payer: Self-pay | Admitting: Family Medicine

## 2013-04-22 ENCOUNTER — Ambulatory Visit (INDEPENDENT_AMBULATORY_CARE_PROVIDER_SITE_OTHER): Payer: Medicare Other | Admitting: Family Medicine

## 2013-04-22 ENCOUNTER — Ambulatory Visit: Payer: Medicare Other | Admitting: Family Medicine

## 2013-04-22 VITALS — BP 124/70 | HR 74 | Temp 98.1°F | Wt 120.0 lb

## 2013-04-22 DIAGNOSIS — J22 Unspecified acute lower respiratory infection: Secondary | ICD-10-CM | POA: Insufficient documentation

## 2013-04-22 DIAGNOSIS — J069 Acute upper respiratory infection, unspecified: Secondary | ICD-10-CM

## 2013-04-22 MED ORDER — GUAIFENESIN-CODEINE 100-10 MG/5ML PO SYRP: 5.0000 mL | ORAL_SOLUTION | Freq: Two times a day (BID) | ORAL | Status: DC | PRN

## 2013-04-22 MED ORDER — AMOXICILLIN-POT CLAVULANATE 875-125 MG PO TABS: 1.0000 | ORAL_TABLET | Freq: Two times a day (BID) | ORAL | Status: AC

## 2013-04-22 NOTE — Progress Notes (Signed)
Pt seen and examined with PA student Anna Genre  CC: URI  1 wk ago started noticing pressure in ears improved with advil cold/sinus.  5d ago developed RN, ST.  Treated with OTC med recommended by pharmacist.  Cough/chill started 2d ago.  Cough productive of clear phlegm.   Continued nausea intermittently, congestion.  Cough keeping her up at night.  Sinus pressure present as well.  + chills.  + PNDrainage.  Husband, daughter, and grandchild sick recently as well.  Has been using chloraseptic spray and 1/2 tablet of husband's amoxicillin.  Husband smokes outside. No h/o asthma.  Sugars have been running elevated - highest of 270s  Past Medical History  Diagnosis Date  . Diabetes mellitus type I     dx at 52 y/o  . History of diabetic gastroparesis   . HLD (hyperlipidemia)     borderline  . HTN (hypertension)     borderline  . Nephrolithiasis 2005  . Hx: UTI (urinary tract infection)   . Disorder of kidney     has abd kidney (right)    PE:  GEN: WDWN CF, NAD HEENT: canals clear, TMs intact, MMM, no oropharyngeal erythema or exudate, nares clear, no neck LAD CVS: nl S1, S2, no m/r/g Pulm: CTAB, no crackles/wheezing, harsh cough present

## 2013-04-22 NOTE — Assessment & Plan Note (Signed)
Anticipate viral sinusitis - discussed anticipated viral process. Given comorbidities, did provide with WASP for augmentin but advised to hold for next few days to see if improvement on its own. Cheratussin, mucinex for cough.

## 2013-04-22 NOTE — Progress Notes (Signed)
  Subjective:    Patient ID: Felicia [name], female    DOB: 04-15-1961, 52 y.o.   MRN: [id number] CC: congestion, nausea, chills  URI  Associated symptoms include congestion, coughing, nausea, rhinorrhea and a sore throat. Pertinent negatives include no diarrhea, ear pain, vomiting or wheezing.   Patient presents with multiple symptoms that started over the weekend. Reports that she noticed pressure in her ears last weekend.  Took Advil cold and sinus, which provided some relief.  Monday she developed a runny nose and sore throat.  Called the pharmacy and bought a recommended medication that helped with the runny nose but made her dizzy and nauseous.  Cough and chills started on Wednesday.  Cough is productive with a clear phlegm.  The patient still feels nauseous intermittently.  Her runny nose has subsided but she is still congested.  Throat feels irritated from frequent coughing.  Husband, daughter, and granddaughter have all been sick recently.  In addition to the medication recommended by the pharmacy, she has taken 1/2 Amoxicillin tablet (875 mg) the past 2 days, chloraseptic spray, and cough drops.  She has noticed decreased appetite and difficulty sleeping as a result of her cough.  History of seasonal allergies, but denies medication allergies.   Review of Systems  Constitutional: Positive for chills and appetite change. Negative for fever and diaphoresis.  HENT: Positive for congestion, sore throat and rhinorrhea. Negative for hearing loss, ear pain and sinus pressure.   Respiratory: Positive for cough. Negative for choking, shortness of breath and wheezing.   Gastrointestinal: Positive for nausea. Negative for vomiting and diarrhea.       Objective:   Physical Exam  Constitutional: She is oriented to person, place, and time. She appears well-developed and well-nourished. No distress.  HENT:  Right Ear: Tympanic membrane normal. No tenderness.  Left Ear: Tympanic membrane normal. No  tenderness.  Nose: Nose normal.  Mouth/Throat: Uvula is midline, oropharynx is clear and moist and mucous membranes are normal. No oropharyngeal exudate.  Eyes: Pupils are equal, round, and reactive to light.  Cardiovascular: Normal rate.   Murmur heard. Pulmonary/Chest: Effort normal and breath sounds normal. No accessory muscle usage or stridor. No respiratory distress.  Lymphadenopathy:    She has no cervical adenopathy.  Neurological: She is alert and oriented to person, place, and time.  Skin: Skin is warm. No rash noted.          Assessment & Plan:  Viral URI-likely due to symptoms and duration of illness -Mucinex or immediate release guafinesin to relieve congestion -Cheratussin at bedtime for symptom relief -Augmentin for bacterial coverage if not improving in next few days -Instructed to return if condition worsens, does not improve over the weekend, or develops a high fever

## 2013-04-22 NOTE — Patient Instructions (Signed)
Sounds like you have a viral upper respiratory infection. Viral infections usually take 7-10 days to resolve.  The cough can last several weeks to go away. Use medication as prescribed: augmentin to hold on to in case not improving as expected or any worsening. Push fluids and plenty of rest. Plain mucinex or immediate release guaifenesin with plenty of water to mobilize mucous. cheratussin for cough at night as needed. Please return if you are not improving as expected, or if you have high fevers (>101.5) or difficulty swallowing or worsening productive cough. Call clinic with questions.  Good to see you today.

## 2013-05-20 ENCOUNTER — Encounter: Payer: Self-pay | Admitting: Internal Medicine

## 2013-06-11 ENCOUNTER — Encounter: Payer: Self-pay | Admitting: Internal Medicine

## 2013-06-15 ENCOUNTER — Encounter: Payer: Self-pay | Admitting: Internal Medicine

## 2013-06-15 ENCOUNTER — Ambulatory Visit (INDEPENDENT_AMBULATORY_CARE_PROVIDER_SITE_OTHER): Payer: Medicare Other | Admitting: Internal Medicine

## 2013-06-15 ENCOUNTER — Other Ambulatory Visit (INDEPENDENT_AMBULATORY_CARE_PROVIDER_SITE_OTHER): Payer: Medicare Other

## 2013-06-15 ENCOUNTER — Telehealth: Payer: Self-pay | Admitting: Gastroenterology

## 2013-06-15 VITALS — BP 116/60 | HR 84 | Ht 63.5 in | Wt 126.4 lb

## 2013-06-15 DIAGNOSIS — Z1211 Encounter for screening for malignant neoplasm of colon: Secondary | ICD-10-CM

## 2013-06-15 DIAGNOSIS — R112 Nausea with vomiting, unspecified: Secondary | ICD-10-CM

## 2013-06-15 DIAGNOSIS — K3184 Gastroparesis: Secondary | ICD-10-CM

## 2013-06-15 DIAGNOSIS — R197 Diarrhea, unspecified: Secondary | ICD-10-CM

## 2013-06-15 LAB — CBC
HCT: 37.6 % (ref 36.0–46.0)
Hemoglobin: 12.6 g/dL (ref 12.0–15.0)
MCHC: 33.4 g/dL (ref 30.0–36.0)
MCV: 89.5 fl (ref 78.0–100.0)
Platelets: 306 10*3/uL (ref 150.0–400.0)
RBC: 4.21 Mil/uL (ref 3.87–5.11)
RDW: 13.5 % (ref 11.5–14.6)
WBC: 5.8 10*3/uL (ref 4.5–10.5)

## 2013-06-15 LAB — COMPREHENSIVE METABOLIC PANEL
ALT: 21 U/L (ref 0–35)
AST: 24 U/L (ref 0–37)
Albumin: 3.9 g/dL (ref 3.5–5.2)
Alkaline Phosphatase: 51 U/L (ref 39–117)
BUN: 18 mg/dL (ref 6–23)
CO2: 28 mEq/L (ref 19–32)
Calcium: 9.1 mg/dL (ref 8.4–10.5)
Chloride: 101 mEq/L (ref 96–112)
Creatinine, Ser: 0.8 mg/dL (ref 0.4–1.2)
GFR: 78.33 mL/min (ref >60.00)
Glucose, Bld: 40 mg/dL — CL (ref 70–99)
Potassium: 4.2 mEq/L (ref 3.5–5.1)
Sodium: 138 mEq/L (ref 135–145)
Total Bilirubin: 0.5 mg/dL (ref 0.3–1.2)
Total Protein: 7.2 g/dL (ref 6.0–8.3)

## 2013-06-15 LAB — TSH: TSH: 1.26 u[IU]/mL (ref 0.35–5.50)

## 2013-06-15 LAB — IGA: IgA: 189 mg/dL (ref 68–378)

## 2013-06-15 NOTE — Progress Notes (Signed)
Patient ID: Felicia [name], female   DOB: Aug 22, 1961, 52 y.o.   MRN: [id number] HPI: Felicia [name] is a 52 yo female with PMH of DM1 (since age 61), gastroparesis, HLD, HTN, and IBS who is seen in consultation at the request of Dr. [name] for evaluation of diarrhea, early satiety and occasional nausea vomiting.  The patient is alone today. She reports that her bowel regularity dates back 15-20 years ago. She reports initially her intermittent loose stools were felt to be secondary to lactose intolerance. She has totally avoid lactose for a long time. She also reports being diagnosed with gastroparesis after an upper GI series. However over the last few years, even more so the last 3 months she's developed worsening diarrhea. She reports as many as 15 stools per day, including nocturnal stools. This is associated with borborygmi, gas and bloating. He does not seem to relate to stress or specific dietary intake. She seen no blood in her stool or melena. She has used Imodium, but needed to escalate the doses in order to curtail the diarrhea. When she is taking Imodium she will often have no stool for 2-3 days and then start the cycle of diarrhea again. She does have some lower abdominal cramping with this diarrhea. No tenesmus. She also endorses intermittent nausea and early satiety. Occasionally she does vomit and she can vomit food eaten greater than 12 hours before.  No hematemesis. She does have infrequent heartburn and she estimates once per month. No dysphagia or odynophagia. She does not specifically follow a gastroparesis diet but does avoid cruciferous vegetables and other bloating foods including carbonated beverages. No family history of IBD or celiac disease. Her grandfather had colon cancer in his 39s. She has never had endoscopy  Past Medical History  Diagnosis Date  . Diabetes mellitus type I     dx at 52 y/o  . History of diabetic gastroparesis   . HLD (hyperlipidemia)     borderline  . HTN  (hypertension)     borderline  . Nephrolithiasis 2005  . Hx: UTI (urinary tract infection)   . Disorder of kidney     has abd kidney (right)  . Systolic murmur 01/2013    mild mitral insuff  . IBS (irritable bowel syndrome)     Past Surgical History  Procedure Laterality Date  . Tonsillectomy and adenoidectomy  1975  . Cesarean section  1985  . Tibia fracture surgery Left 1996  . Partial hysterectomy  2000    paps by [facility] [place]  . Trigger finger release Left 12/2010    thumb    Current Outpatient Prescriptions  Medication Sig Dispense Refill  . aspirin 81 MG tablet Take 81 mg by mouth daily.        . insulin aspart (NOVOLOG) 100 UNIT/ML injection Inject into the skin as directed. Sliding scale       . insulin glargine (LANTUS) 100 UNIT/ML injection Inject into the skin as directed. 12 units at dinner      . Multiple Vitamin (MULTIVITAMIN) tablet Take 1 tablet by mouth daily.        . quinapril (ACCUPRIL) 10 MG tablet Take 10 mg by mouth at bedtime.        . rosuvastatin (CRESTOR) 5 MG tablet Take 5 mg by mouth daily.         No current facility-administered medications for this visit.    Allergies  Allergen Reactions  . Lactose Intolerance (Gi)     Family  History  Problem Relation Age of Onset  . Diabetes Maternal Grandmother   . Heart failure Maternal Grandmother   . COPD Maternal Grandmother     smoker  . Thyroid disease Maternal Grandmother   . Colon cancer Maternal Grandfather   . Vascular Disease Maternal Grandmother     History  Substance Use Topics  . Smoking status: Never Smoker   . Smokeless tobacco: Never Used  . Alcohol Use: No    ROS: As per history of present illness, otherwise negative  BP 116/60  Pulse 84  Ht 5' 3.5" (1.613 m)  Wt 126 lb 6 oz (57.323 kg)  BMI 22.03 kg/m2 Constitutional: Well-developed and well-nourished. No distress. HEENT: Normocephalic and atraumatic. Oropharynx is clear and moist. No oropharyngeal exudate.  Conjunctivae are normal.  No scleral icterus. Neck: Neck supple. Trachea midline. Cardiovascular: Normal rate, regular rhythm and intact distal pulses. No M/R/G Pulmonary/chest: Effort normal and breath sounds normal. No wheezing, rales or rhonchi. Abdominal: Soft, nontender, nondistended. Bowel sounds active throughout.  Extremities: no clubbing, cyanosis, or edema Lymphadenopathy: No cervical adenopathy noted. Neurological: Alert and oriented to person place and time. Skin: Skin is warm and dry. No rashes noted. Psychiatric: Normal mood and affect. Behavior is normal.  RELEVANT LABS Labs ordered today CMP, CBC, TSH and celiac panel  ASSESSMENT/PLAN: 52 yo female with PMH of DM1 (since age 41), gastroparesis, HLD, HTN, and IBS who is seen in consultation at the request of Dr. [name] for evaluation of diarrhea, early satiety and occasional nausea vomiting.  1.  Diarrhea -- her diarrhea is long-standing, though worsening over the last 3-4 months. This certainly qualifies for chronic diarrhea making an infectious etiology much less likely. I have recommended colonoscopy both for screening and to evaluate diarrhea, specifically to rule out microscopic colitis. I will check a celiac panel and thyroid stimulating hormone today.  Other possibilities include diabetic colopathy, bacterial overgrowth.  Further recommendations after colonoscopy, she can continue Imodium on an as-needed basis for now.  2.  Colorectal cancer screening -- average risk, proceeding to colonoscopy as discussed #1.  Procedure discussed including the risks and benefits and she is agreeable to proceed  3.  N/V/early satiety -- her upper GI symptoms could be consistent with gastroparesis. I recommended a gastroparesis diet and she was given a handout explaining his diet today. I have recommended upper endoscopy at the same time as her colonoscopy to exclude other upper GI pathology. We'll plan biopsies to definitively rule out  celiac disease.

## 2013-06-15 NOTE — Patient Instructions (Addendum)
Dr. Rhea Belton recommends you have a colonoscopy and endoscopy.  Please call our office at your convenience to schedule these appointments.  Your physician has requested that you go to the basement for  lab work before leaving today Gastroparesis  Gastroparesis is also called slowed stomach emptying (delayed gastric emptying). It is a condition in which the stomach takes too long to empty its contents. It often happens in people with diabetes.  CAUSES  Gastroparesis happens when nerves to the stomach are damaged or stop working. When the nerves are damaged, the muscles of the stomach and intestines do not work normally. The movement of food is slowed or stopped. High blood glucose (sugar) causes changes in nerves and can damage the blood vessels that carry oxygen and nutrients to the nerves. RISK FACTORS  Diabetes.  Post-viral syndromes.  Eating disorders (anorexia, bulimia).  Surgery on the stomach or vagus nerve.  Gastroesophageal reflux disease (rarely).  Smooth muscle disorders (amyloidosis, scleroderma).  Metabolic disorders, including hypothyroidism.  Parkinson's disease. SYMPTOMS   Heartburn.  Feeling sick to your stomach (nausea).  Vomiting of undigested food.  An early feeling of fullness when eating.  Weight loss.  Abdominal bloating.  Erratic blood glucose levels.  Lack of appetite.  Gastroesophageal reflux.  Spasms of the stomach wall. Complications can include:  Bacterial overgrowth in stomach. Food stays in the stomach and can ferment and cause bacteria to grow.  Weight loss due to difficulty digesting and absorbing nutrients.  Vomiting.  Obstruction in the stomach. Undigested food can harden and cause nausea and vomiting.  Blood glucose fluctuations caused by inconsistent food absorption. DIAGNOSIS  The diagnosis of gastroparesis is confirmed through one or more of the following tests:  Barium X-rays and scans. These tests look at how long it  takes for food to move through the stomach.  Gastric manometry. This test measures electrical and muscular activity in the stomach. A thin tube is passed down the throat into the stomach. The tube contains a wire that takes measurements of the stomach's electrical and muscular activity as it digests liquids and solid food.  Endoscopy. This procedure is done with a long, thin tube called an endoscope. It is passed through the mouth and gently guides down the esophagus into the stomach. This tube helps the caregiver look at the lining of the stomach to check for any abnormalities.  Ultrasound. This can rule out gallbladder disease or pancreatitis. This test will outline and define the shape of the gallbladder and pancreas. TREATMENT   The primary treatment is to identify the problem and help control blood glucose levels. Treatments include:  Exercise.  Medicines to control nausea and vomiting.  Medicines to stimulate stomach muscles.  Changes in what and when you eat.  Having smaller meals more often.  Eating low-fiber forms of high-fiber foods, such aseating cooked vegetables instead of raw vegetables.  Eating low-fat foods.  Consuming liquids, which are easier to digest.  In severe cases, feeding tubes and intravenous (IV) feeding may be needed. It is important to note that in most cases, treatment does not cure gastroparesis. It is usually a lasting (chronic) condition. Treatment helps you manage the condition so that you can be as healthy and comfortable as possible. NEW TREATMENTS  A gastric neurostimulator has been developed to assist people with gastroparesis. The battery-operated device is surgically implanted. It emits mild electrical pulses to help improve stomach emptying and to control nausea and vomiting.  The use of botulinum toxin has been shown  to improve stomach emptying by decreasing the prolonged contractions of the muscle between the stomach and the small intestine  (pyloric sphincter). The benefits are temporary. SEEK MEDICAL CARE IF:   You are having problems keeping your blood glucose in goal range.  You are having nausea, vomiting, bloating, or early feelings of fullness with eating.  Your symptoms do not change with a change in diet. Document Released: 10/21/2005 Document Revised: 01/13/2012 Document Reviewed: 03/30/2009 [facility] Patient Information 2014 [place], [place].

## 2013-06-15 NOTE — Telephone Encounter (Signed)
Lab called to give critical lab result glucose came back at 40; called pt to let her know and make sure she has had something to eat or drink. Pt stated she felt it on her way to the lab and started drinking juice and has since eaten and is feeling fine.

## 2013-06-16 LAB — TISSUE TRANSGLUTAMINASE, IGA: Tissue Transglutaminase Ab, IgA: 3.3 U/mL (ref <20)

## 2013-10-20 ENCOUNTER — Telehealth: Payer: Self-pay | Admitting: Internal Medicine

## 2013-10-20 NOTE — Telephone Encounter (Signed)
lvm for pt to call me to discuss scheduling an ECL with Dr. Rhea Belton.

## 2013-11-08 ENCOUNTER — Encounter: Payer: Self-pay | Admitting: Internal Medicine

## 2013-11-08 ENCOUNTER — Ambulatory Visit (INDEPENDENT_AMBULATORY_CARE_PROVIDER_SITE_OTHER): Payer: No Typology Code available for payment source | Admitting: Internal Medicine

## 2013-11-08 VITALS — BP 104/64 | HR 74 | Temp 97.7°F | Wt 124.2 lb

## 2013-11-08 DIAGNOSIS — J069 Acute upper respiratory infection, unspecified: Secondary | ICD-10-CM

## 2013-11-08 MED ORDER — AZITHROMYCIN 250 MG PO TABS: ORAL_TABLET | ORAL | Status: DC

## 2013-11-08 NOTE — Patient Instructions (Signed)

## 2013-11-08 NOTE — Progress Notes (Signed)
HPI  Pt presents to the clinic today with c/o cold symptoms x 1 week. The cough is productive of clear thick sputum. She also c/o nasal congestion, ear pressure and headache. She did take some Claritin which made her feel even worse. She then took Tylenol cold and sinus, which has helped. She did get her flu shot. She has no history of allergies or asthma. She has not had sick contacts.  Review of Systems      Past Medical History  Diagnosis Date  . Diabetes mellitus type I     dx at 53 y/o  . History of diabetic gastroparesis   . HLD (hyperlipidemia)     borderline  . HTN (hypertension)     borderline  . Nephrolithiasis 2005  . Hx: UTI (urinary tract infection)   . Disorder of kidney     has abd kidney (right)  . Systolic murmur 01/2013    mild mitral insuff  . IBS (irritable bowel syndrome)     Family History  Problem Relation Age of Onset  . Diabetes Maternal Grandmother   . Heart failure Maternal Grandmother   . COPD Maternal Grandmother     smoker  . Thyroid disease Maternal Grandmother   . Colon cancer Maternal Grandfather   . Vascular Disease Maternal Grandmother     History   Social History  . Marital Status: Married    Spouse Name: N/A    Number of Children: 1  . Years of Education: N/A   Occupational History  . HR director Other    [employer]   Social History Main Topics  . Smoking status: Never Smoker   . Smokeless tobacco: Never Used  . Alcohol Use: No  . Drug Use: No  . Sexual Activity: Not on file   Other Topics Concern  . 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 Lactose Intolerance (Gi)      Constitutional: Positive headache, fatigue and fever. Denies abrupt weight changes.  HEENT:  Positive sore throat. Denies eye redness, eye pain, pressure behind the eyes, facial pain, nasal  congestion, ear pain, ringing in the ears, wax buildup, runny nose or bloody nose. Respiratory: Positive cough. Denies difficulty breathing or shortness of breath.  Cardiovascular: Denies chest pain, chest tightness, palpitations or swelling in the hands or feet.   No other specific complaints in a complete review of systems (except as listed in HPI above).  Objective:   BP 104/64  Pulse 74  Temp(Src) 97.7 F (36.5 C) (Oral)  Wt 124 lb 4 oz (56.359 kg)  SpO2 97% Wt Readings from Last 3 Encounters:  11/08/13 124 lb 4 oz (56.359 kg)  06/15/13 126 lb 6 oz (57.323 kg)  04/22/13 120 lb (54.432 kg)     General: Appears her stated age, well developed, well nourished in NAD. HEENT: Head: normal shape and size; Eyes: sclera white, no icterus, conjunctiva pink, PERRLA and EOMs intact; Ears: Tm's gray and intact, normal light reflex; Nose: mucosa pink and moist, septum midline; Throat/Mouth: + PND. Teeth present, mucosa erythematous and moist, no exudate noted, no lesions or ulcerations noted.  Neck: Mild cervical lymphadenopathy. Neck supple, trachea midline. No massses, lumps or thyromegaly present.  Cardiovascular: Normal rate and rhythm. S1,S2 noted. Murmur noted. No  rubs  or gallops noted. No JVD or BLE edema. No carotid bruits noted. Pulmonary/Chest: Normal effort and positive vesicular breath sounds. No respiratory distress. No wheezes, rales or ronchi noted.      Assessment & Plan:   Upper Respiratory Infection with worsening symptoms  Get some rest and drink plenty of water Do salt water gargles for the sore throat eRx for Azithromax x 5 days   RTC as needed or if symptoms persist.

## 2013-11-08 NOTE — Progress Notes (Signed)
Pre-visit discussion using our clinic review tool. No additional management support is needed unless otherwise documented below in the visit note.  

## 2013-12-01 ENCOUNTER — Encounter: Payer: Self-pay | Admitting: Internal Medicine

## 2013-12-01 ENCOUNTER — Encounter: Payer: Self-pay | Admitting: *Deleted

## 2013-12-01 ENCOUNTER — Ambulatory Visit (INDEPENDENT_AMBULATORY_CARE_PROVIDER_SITE_OTHER): Payer: No Typology Code available for payment source | Admitting: Internal Medicine

## 2013-12-01 VITALS — BP 110/70 | HR 82 | Temp 97.9°F | Wt 123.0 lb

## 2013-12-01 DIAGNOSIS — K529 Noninfective gastroenteritis and colitis, unspecified: Secondary | ICD-10-CM

## 2013-12-01 DIAGNOSIS — K5289 Other specified noninfective gastroenteritis and colitis: Secondary | ICD-10-CM

## 2013-12-01 MED ORDER — PROMETHAZINE HCL 12.5 MG PO TABS: 12.5000 mg | ORAL_TABLET | Freq: Four times a day (QID) | ORAL | Status: DC | PRN

## 2013-12-01 NOTE — Progress Notes (Signed)
Pre-visit discussion using our clinic review tool. No additional management support is needed unless otherwise documented below in the visit note.  

## 2013-12-01 NOTE — Assessment & Plan Note (Signed)
Seems like self limited viral illness May have prolonged it due to imodium use Not dehydrated Discussed limited food and keep up liquids Phenergan prn

## 2013-12-01 NOTE — Progress Notes (Signed)
Subjective:    Patient ID: Felicia [name], female    DOB: 04-Jun-1961, 53 y.o.   MRN: [id number]  HPI Had mild nausea and diarrhea about a week ago--- went away within a day  Then 3 days ago, had recurrence Diarrhea got better yesterday  Nausea really worse ~2AM this AM Appetite is off  Feels rectal urgency and cramping--but no stool today No blood in stool No hematemesis  Entire family has had GI illness---her's is lasting longer Did try some imodium  No major abdominal pain No recent travel No cough or SOB  Current Outpatient Prescriptions on File Prior to Visit  Medication Sig Dispense Refill  . aspirin 81 MG tablet Take 81 mg by mouth daily.        . insulin aspart (NOVOLOG) 100 UNIT/ML injection Inject into the skin as directed. Sliding scale       . insulin glargine (LANTUS) 100 UNIT/ML injection Inject into the skin as directed. 12 units at dinner      . Multiple Vitamin (MULTIVITAMIN) tablet Take 1 tablet by mouth daily.        . quinapril (ACCUPRIL) 10 MG tablet Take 10 mg by mouth at bedtime.        . rosuvastatin (CRESTOR) 5 MG tablet Take 5 mg by mouth daily.         No current facility-administered medications on file prior to visit.    Allergies  Allergen Reactions  . Lactose Intolerance (Gi)     Past Medical History  Diagnosis Date  . Diabetes mellitus type I     dx at 53 y/o  . History of diabetic gastroparesis   . HLD (hyperlipidemia)     borderline  . HTN (hypertension)     borderline  . Nephrolithiasis 2005  . Hx: UTI (urinary tract infection)   . Disorder of kidney     has abd kidney (right)  . Systolic murmur 01/2013    mild mitral insuff  . IBS (irritable bowel syndrome)     Past Surgical History  Procedure Laterality Date  . Tonsillectomy and adenoidectomy  1975  . Cesarean section  1985  . Tibia fracture surgery Left 1996  . Partial hysterectomy  2000    paps by [facility] [place]  . Trigger finger release Left 12/2010   thumb    Family History  Problem Relation Age of Onset  . Diabetes Maternal Grandmother   . Heart failure Maternal Grandmother   . COPD Maternal Grandmother     smoker  . Thyroid disease Maternal Grandmother   . Colon cancer Maternal Grandfather   . Vascular Disease Maternal Grandmother     History   Social History  . Marital Status: Married    Spouse Name: N/A    Number of Children: 1  . Years of Education: N/A   Occupational History  . HR director Other    [employer]   Social History Main Topics  . Smoking status: Never Smoker   . Smokeless tobacco: Never Used  . Alcohol Use: No  . Drug Use: No  . Sexual Activity: Not on file   Other Topics Concern  . 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 No distress.  Washed out but no distress  Neck: Normal range of motion. Neck supple.  Cardiovascular: Normal rate and regular rhythm.   Pulmonary/Chest: Effort normal and breath sounds normal. No respiratory distress. She has no wheezes. She has no rales.  Abdominal: Soft. Bowel sounds are normal. She exhibits no distension and no mass. There is no rebound and no guarding.  Initially some RLQ tenderness--then better. I was able to palpate fairly deeply without pain  Lymphadenopathy:    She has no cervical adenopathy.          Assessment & Plan:

## 2013-12-01 NOTE — Patient Instructions (Signed)
Viral Gastroenteritis Viral gastroenteritis is also known as stomach flu. This condition affects the stomach and intestinal tract. It can cause sudden diarrhea and vomiting. The illness typically lasts 3 to 8 days. Most people develop an immune response that eventually gets rid of the virus. While this natural response develops, the virus can make you quite ill. CAUSES  Many different viruses can cause gastroenteritis, such as rotavirus or noroviruses. You can catch one of these viruses by consuming contaminated food or water. You may also catch a virus by sharing utensils or other personal items with an infected person or by touching a contaminated surface. SYMPTOMS  The most common symptoms are diarrhea and vomiting. These problems can cause a severe loss of body fluids (dehydration) and a body salt (electrolyte) imbalance. Other symptoms may include:  Fever.  Headache.  Fatigue.  Abdominal pain. DIAGNOSIS  Your caregiver can usually diagnose viral gastroenteritis based on your symptoms and a physical exam. A stool sample may also be taken to test for the presence of viruses or other infections. TREATMENT  This illness typically goes away on its own. Treatments are aimed at rehydration. The most serious cases of viral gastroenteritis involve vomiting so severely that you are not able to keep fluids down. In these cases, fluids must be given through an intravenous line (IV). HOME CARE INSTRUCTIONS   Drink enough fluids to keep your urine clear or pale yellow. Drink small amounts of fluids frequently and increase the amounts as tolerated.  Ask your caregiver for specific rehydration instructions.  Avoid:  Foods high in sugar.  Alcohol.  Carbonated drinks.  Tobacco.  Juice.  Caffeine drinks.  Extremely hot or cold fluids.  Fatty, greasy foods.  Too much intake of anything at one time.  Dairy products until 24 to 48 hours after diarrhea stops.  You may consume probiotics.  Probiotics are active cultures of beneficial bacteria. They may lessen the amount and number of diarrheal stools in adults. Probiotics can be found in yogurt with active cultures and in supplements.  Wash your hands well to avoid spreading the virus.  Only take over-the-counter or prescription medicines for pain, discomfort, or fever as directed by your caregiver. Do not give aspirin to children. Antidiarrheal medicines are not recommended.  Ask your caregiver if you should continue to take your regular prescribed and over-the-counter medicines.  Keep all follow-up appointments as directed by your caregiver. SEEK IMMEDIATE MEDICAL CARE IF:   You are unable to keep fluids down.  You do not urinate at least once every 6 to 8 hours.  You develop shortness of breath.  You notice blood in your stool or vomit. This may look like coffee grounds.  You have abdominal pain that increases or is concentrated in one small area (localized).  You have persistent vomiting or diarrhea.  You have a fever.  The patient is a child younger than 3 months, and he or she has a fever.  The patient is a child older than 3 months, and he or she has a fever and persistent symptoms.  The patient is a child older than 3 months, and he or she has a fever and symptoms suddenly get worse.  The patient is a baby, and he or she has no tears when crying. MAKE SURE YOU:   Understand these instructions.  Will watch your condition.  Will get help right away if you are not doing well or get worse. Document Released: 10/21/2005 Document Revised: 01/13/2012 Document Reviewed: 08/07/2011   [employer] Patient Information 2014 [employer]  

## 2013-12-24 ENCOUNTER — Other Ambulatory Visit: Payer: Self-pay | Admitting: Internal Medicine

## 2013-12-28 ENCOUNTER — Encounter: Payer: Medicare Other | Admitting: Internal Medicine

## 2013-12-29 ENCOUNTER — Ambulatory Visit (INDEPENDENT_AMBULATORY_CARE_PROVIDER_SITE_OTHER): Payer: No Typology Code available for payment source | Admitting: Family Medicine

## 2013-12-29 ENCOUNTER — Encounter: Payer: Self-pay | Admitting: Family Medicine

## 2013-12-29 VITALS — BP 134/82 | HR 76 | Temp 98.0°F | Wt 124.8 lb

## 2013-12-29 DIAGNOSIS — M79604 Pain in right leg: Secondary | ICD-10-CM

## 2013-12-29 DIAGNOSIS — M79605 Pain in left leg: Principal | ICD-10-CM

## 2013-12-29 DIAGNOSIS — M79609 Pain in unspecified limb: Secondary | ICD-10-CM

## 2013-12-29 LAB — COMPREHENSIVE METABOLIC PANEL
ALT: 17 U/L (ref 0–35)
AST: 16 U/L (ref 0–37)
Albumin: 3.7 g/dL (ref 3.5–5.2)
Alkaline Phosphatase: 58 U/L (ref 39–117)
BUN: 21 mg/dL (ref 6–23)
CO2: 30 mEq/L (ref 19–32)
Calcium: 9.1 mg/dL (ref 8.4–10.5)
Chloride: 95 mEq/L — ABNORMAL LOW (ref 96–112)
Creatinine, Ser: 1 mg/dL (ref 0.4–1.2)
GFR: 62.16 mL/min (ref >60.00)
Glucose, Bld: 192 mg/dL — ABNORMAL HIGH (ref 70–99)
Potassium: 4.1 mEq/L (ref 3.5–5.1)
Sodium: 135 mEq/L (ref 135–145)
Total Bilirubin: 0.5 mg/dL (ref 0.3–1.2)
Total Protein: 6.8 g/dL (ref 6.0–8.3)

## 2013-12-29 LAB — CK: Total CK: 28 U/L (ref 7–177)

## 2013-12-29 LAB — TSH: TSH: 1.2 u[IU]/mL (ref 0.35–5.50)

## 2013-12-29 LAB — MAGNESIUM: Magnesium: 1.8 mg/dL (ref 1.5–2.5)

## 2013-12-29 NOTE — Progress Notes (Signed)
BP 134/82  Pulse 76  Temp(Src) 98 F (36.7 C) (Oral)  Wt 124 lb 12 oz (56.586 kg)   CC: leg pain  Subjective:    Patient ID: Felicia [name], female    DOB: 1961/07/25, 53 y.o.   MRN: [id number]  HPI: Felicia [name] is a 53 y.o. female presenting on 12/29/2013 with Follow-up   Ongoing intense muscle pains described as charlie horse sensation R>L legs ongoing for the last several years - getting worse.  Calves to upper legs.  When sitting on couch, when going to sleep.  Seems to start at 4pm.  Had evaluation 2012 with normal CK, Mg, K. No back pain.  No restless leg syndrome.  Treated with ibuprofen, rubbing muscles.  To start prescription anti inflammatory (etodolac) as well. Has been on crestor 5mg  for last 5-6 years.  Has been off statin for the last month. To start zetia per endo (Dr. Tedd Sias).  Also having hip pain, has seen ortho for this.  Unclear etiology.  Currently on prednisone which has caused T1DM to get out of control. To start with PT next week.  Notes she's shrunk an inch.  States has h/o osteopenia on latest DEXA ~2013.  Past Medical History  Diagnosis Date  . Diabetes mellitus type I     dx at 53 y/o  . History of diabetic gastroparesis   . HLD (hyperlipidemia)     borderline  . HTN (hypertension)     borderline  . Nephrolithiasis 2005  . Hx: UTI (urinary tract infection)   . Disorder of kidney     has abd kidney (right)  . Systolic murmur 01/2013    mild mitral insuff  . IBS (irritable bowel syndrome)   . Osteopenia 2012    per pt     Relevant past medical, surgical, family and social history reviewed and updated as indicated.  Allergies and medications reviewed and updated. Current Outpatient Prescriptions on File Prior to Visit  Medication Sig  . aspirin 81 MG tablet Take 81 mg by mouth daily.    . insulin aspart (NOVOLOG) 100 UNIT/ML injection Inject into the skin as directed. Sliding scale   . insulin glargine (LANTUS) 100 UNIT/ML injection  Inject 8 Units into the skin daily with breakfast. 12 units at dinner  . Insulin Pen Needle (NOVOFINE) 32G X 6 MM MISC To be used with insulin injections up to 6 times daily  . Multiple Vitamin (MULTIVITAMIN) tablet Take 1 tablet by mouth daily.    . quinapril (ACCUPRIL) 10 MG tablet Take 10 mg by mouth at bedtime.    . promethazine (PHENERGAN) 12.5 MG tablet Take 1-2 tablets (12.5-25 mg total) by mouth every 6 (six) hours as needed for nausea or vomiting.  . rosuvastatin (CRESTOR) 5 MG tablet Take 5 mg by mouth daily.     No current facility-administered medications on file prior to visit.    Review of Systems Per HPI unless specifically indicated above    Objective:    BP 134/82  Pulse 76  Temp(Src) 98 F (36.7 C) (Oral)  Wt 124 lb 12 oz (56.586 kg)  Physical Exam  Nursing note and vitals reviewed. Constitutional: She appears well-developed and well-nourished. No distress.  Cardiovascular: Normal rate, regular rhythm, normal heart sounds and intact distal pulses.   No murmur heard. Pulmonary/Chest: Effort normal and breath sounds normal. No respiratory distress. She has no wheezes. She has no rales.  Musculoskeletal: She exhibits no edema.  2+ DP bilaterally  tender to palpation of R>L calves No pain with palpation of tibia/fibula No deformity or breaks in skin or rash. 5/5 strength BLE       Assessment & Plan:   Problem List Items Addressed This Visit   Leg pain, bilateral - Primary     ?idiopathic leg cramps. Will again check blood work to r/o other cause of myalgias/myositis (CMP, TSH, CK, aldolase). Will also do trial of low pressure compression Pt agrees with plan    Relevant Orders      Comprehensive metabolic panel      TSH      CK      Magnesium      Aldolase       Follow up plan: Return if symptoms worsen or fail to improve.

## 2013-12-29 NOTE — Assessment & Plan Note (Signed)
?  idiopathic leg cramps. Will again check blood work to r/o other cause of myalgias/myositis (CMP, TSH, CK, aldolase). Will also do trial of low pressure compression Pt agrees with plan

## 2013-12-29 NOTE — Patient Instructions (Signed)
Blood work today. Try compression stockings for possible vein insufficiency (prescription provided today) If all unrevealing, may be idiopathic leg cramps.

## 2013-12-29 NOTE — Progress Notes (Signed)
Pre visit review using our clinic review tool, if applicable. No additional management support is needed unless otherwise documented below in the visit note. 

## 2013-12-31 LAB — ALDOLASE: Aldolase: 4.8 U/L (ref <=8.1)

## 2014-01-05 ENCOUNTER — Encounter: Payer: Self-pay | Admitting: *Deleted

## 2014-01-07 ENCOUNTER — Telehealth: Payer: Self-pay

## 2014-01-07 NOTE — Telephone Encounter (Signed)
Pt requesting recent lab results; pt notified as instructed from result note. Pt voiced understanding.

## 2014-02-04 ENCOUNTER — Ambulatory Visit: Payer: Self-pay | Admitting: Obstetrics and Gynecology

## 2014-06-27 ENCOUNTER — Ambulatory Visit: Payer: No Typology Code available for payment source | Admitting: Family Medicine

## 2014-07-04 ENCOUNTER — Ambulatory Visit: Payer: No Typology Code available for payment source | Admitting: Family Medicine

## 2014-07-19 ENCOUNTER — Ambulatory Visit (INDEPENDENT_AMBULATORY_CARE_PROVIDER_SITE_OTHER): Payer: No Typology Code available for payment source | Admitting: Family Medicine

## 2014-07-19 ENCOUNTER — Encounter: Payer: Self-pay | Admitting: Family Medicine

## 2014-07-19 VITALS — BP 118/62 | HR 83 | Temp 97.5°F | Resp 16 | Ht 64.0 in | Wt 128.8 lb

## 2014-07-19 DIAGNOSIS — R21 Rash and other nonspecific skin eruption: Secondary | ICD-10-CM

## 2014-07-19 DIAGNOSIS — R252 Cramp and spasm: Secondary | ICD-10-CM

## 2014-07-19 MED ORDER — MAGNESIUM 400 MG PO CAPS: 1.0000 | ORAL_CAPSULE | Freq: Every day | ORAL | Status: DC

## 2014-07-19 MED ORDER — MAGNESIUM OXIDE -MG SUPPLEMENT 400 (240 MG) MG PO TABS: 1.0000 | ORAL_TABLET | Freq: Every day | ORAL | Status: DC

## 2014-07-19 NOTE — Assessment & Plan Note (Signed)
Anticipate idiopathic leg cramps. Suggested trial of magnesium as well as low dose compression stockings at home. Suggested soap bar under sheets, suggested very small amt tonic water. S/p normal workup including CMP, TSH, CK and aldolase. If persistent despite above, return for further labwork.

## 2014-07-19 NOTE — Progress Notes (Signed)
   BP 118/62  Pulse 83  Temp(Src) 97.5 F (36.4 C) (Oral)  Resp 16  Ht  (1.626 m)  Wt 128 lb 12.8 oz (58.423 kg)  BMI 22.10 kg/m2  SpO2 95%   CC: rash?  Subjective:    Patient ID: Felicia [name], female    DOB: [birth year], 53 y.o.   MRN: [id number]  HPI: Felicia [name] is a 53 y.o. female presenting on 07/19/2014 for Rash   Worsening leg pain recently mainly at night time, now getting leg pain during day, but only happens when laying down or resting, improve if standing up and walking - wonders if med related. Having leg cramping posterior legs that radiates up legs. Saw orthopedist who thought this may be med related. Has done trial off crestor for 1 mo which didn't help. Has been on statin.   Lab Results  Component Value Date   CREATININE 1.0 12/29/2013    Spots on arms - several months ago scratched left posterior forearm on bush, did fully heal but with some hypertrophy of skin.  Relevant past medical, surgical, family and social history reviewed and updated as indicated.  Allergies and medications reviewed and updated. Current Outpatient Prescriptions on File Prior to Visit  Medication Sig  . aspirin 81 MG tablet Take 81 mg by mouth daily.    . insulin aspart (NOVOLOG) 100 UNIT/ML injection Inject into the skin as directed. Sliding scale   . insulin NPH Human (HUMULIN N,NOVOLIN N) 100 UNIT/ML injection Inject 4 Units into the skin at bedtime. 10 u breakfast, 8 u bedtime  . Insulin Pen Needle (NOVOFINE) 32G X 6 MM MISC To be used with insulin injections up to 6 times daily  . Multiple Vitamin (MULTIVITAMIN) tablet Take 1 tablet by mouth daily.    . quinapril (ACCUPRIL) 10 MG tablet Take 10 mg by mouth at bedtime.    Marland Kitchen etodolac (LODINE) 500 MG tablet Take 500 mg by mouth 2 (two) times daily.   No current facility-administered medications on file prior to visit.    Review of Systems Per HPI unless specifically indicated above    Objective:    BP 118/62  Pulse 83   Temp(Src) 97.5 F (36.4 C) (Oral)  Resp 16  Ht  (1.626 m)  Wt 128 lb 12.8 oz (58.423 kg)  BMI 22.10 kg/m2  SpO2 95%  Physical Exam  Nursing note and vitals reviewed. Constitutional: She appears well-developed and well-nourished. No distress.  Musculoskeletal: She exhibits no edema.  Skin: Skin is warm and dry. Rash noted.  Small flesh colored nodules left posterior forearm       Assessment & Plan:   Problem List Items Addressed This Visit   Skin rash - Primary     ?small keloid after abrasion vs granuloma annulare type rash. Suggested try mederma she has at home and monitor for improvement. Update if enlarging rash.    Leg cramping     Anticipate idiopathic leg cramps. Suggested trial of magnesium as well as low dose compression stockings at home. Suggested soap bar under sheets, suggested very small amt tonic water. S/p normal workup including CMP, TSH, CK and aldolase. If persistent despite above, return for further labwork.        Follow up plan: Return if symptoms worsen or fail to improve.

## 2014-07-19 NOTE — Progress Notes (Signed)
Pre visit review using our clinic review tool, if applicable. No additional management support is needed unless otherwise documented below in the visit note. 

## 2014-07-19 NOTE — Patient Instructions (Addendum)
For bumps on skin - try mederma. May be small keloid that formed while healing or may be skin reaction to irritation. For leg cramps - do a trial off zetia for 2 months. Start magnesium  once daily for 1 month. Try small amount of tonic water at bedtime. If none of this helps, let me know and I'd like to check some blood work. Keep me updated with how you're doing.

## 2014-07-19 NOTE — Assessment & Plan Note (Signed)
?  small keloid after abrasion vs granuloma annulare type rash. Suggested try mederma she has at home and monitor for improvement. Update if enlarging rash.

## 2014-10-12 LAB — HM DIABETES EYE EXAM

## 2014-10-22 ENCOUNTER — Encounter: Payer: Self-pay | Admitting: Family Medicine

## 2014-10-22 DIAGNOSIS — E103299 Type 1 diabetes mellitus with mild nonproliferative diabetic retinopathy without macular edema, unspecified eye: Secondary | ICD-10-CM

## 2014-11-08 ENCOUNTER — Ambulatory Visit: Payer: No Typology Code available for payment source | Admitting: Endocrinology

## 2014-11-15 ENCOUNTER — Ambulatory Visit (INDEPENDENT_AMBULATORY_CARE_PROVIDER_SITE_OTHER): Payer: No Typology Code available for payment source | Admitting: Endocrinology

## 2014-11-15 ENCOUNTER — Encounter: Payer: Self-pay | Admitting: Endocrinology

## 2014-11-15 VITALS — BP 128/76 | HR 77 | Ht 64.0 in | Wt 129.5 lb

## 2014-11-15 DIAGNOSIS — E103299 Type 1 diabetes mellitus with mild nonproliferative diabetic retinopathy without macular edema, unspecified eye: Secondary | ICD-10-CM

## 2014-11-15 DIAGNOSIS — I10 Essential (primary) hypertension: Secondary | ICD-10-CM

## 2014-11-15 DIAGNOSIS — E785 Hyperlipidemia, unspecified: Secondary | ICD-10-CM

## 2014-11-15 DIAGNOSIS — E10329 Type 1 diabetes mellitus with mild nonproliferative diabetic retinopathy without macular edema: Secondary | ICD-10-CM

## 2014-11-15 LAB — COMPREHENSIVE METABOLIC PANEL
ALT: 15 U/L (ref 0–35)
AST: 21 U/L (ref 0–37)
Albumin: 3.9 g/dL (ref 3.5–5.2)
Alkaline Phosphatase: 58 U/L (ref 39–117)
BUN: 14 mg/dL (ref 6–23)
CO2: 27 mEq/L (ref 19–32)
Calcium: 9.1 mg/dL (ref 8.4–10.5)
Chloride: 102 mEq/L (ref 96–112)
Creatinine, Ser: 1 mg/dL (ref 0.4–1.2)
GFR: 65.72 mL/min (ref >60.00)
Glucose, Bld: 219 mg/dL — ABNORMAL HIGH (ref 70–99)
Potassium: 4.9 mEq/L (ref 3.5–5.1)
Sodium: 135 mEq/L (ref 135–145)
Total Bilirubin: 0.6 mg/dL (ref 0.2–1.2)
Total Protein: 6.8 g/dL (ref 6.0–8.3)

## 2014-11-15 LAB — VITAMIN D 25 HYDROXY (VIT D DEFICIENCY, FRACTURES): VITD: 27.24 ng/mL — ABNORMAL LOW (ref 30.00–100.00)

## 2014-11-15 LAB — HM DIABETES FOOT EXAM: HM Diabetic Foot Exam: NORMAL

## 2014-11-15 LAB — T4, FREE: Free T4: 0.91 ng/dL (ref 0.60–1.60)

## 2014-11-15 LAB — TSH: TSH: 1.94 u[IU]/mL (ref 0.35–4.50)

## 2014-11-15 LAB — HEMOGLOBIN A1C: Hgb A1c MFr Bld: 8.2 % — ABNORMAL HIGH (ref 4.6–6.5)

## 2014-11-15 MED ORDER — GLUCAGON (RDNA) 1 MG IJ KIT: 1.0000 mg | PACK | Freq: Once | INTRAMUSCULAR | Status: DC | PRN

## 2014-11-15 NOTE — Assessment & Plan Note (Signed)
Has been recently off Zetia at this time. Going to see PCP for evaluation of leg cramping. At next visits, consider restarting statin. Check Vitamin D to rule out deficiency causing myalgias.

## 2014-11-15 NOTE — Progress Notes (Signed)
Patient ID: Felicia [name], female   DOB: [birth year], 54 y.o.   MRN: [id number]   REASON FOR VISIT- Felicia [name] is a 54 y.o.-year-old female, self referral, PCP is Ria Bush, MD, for management of Type 1 Diabetes Mellitus, uncontrolled, with complications ( retinopathy, gastroparesis).  HPI- Patient recalls being diagnosed with diabetes around age 58. Following this, she started on insulin therapy at diagnosis. Over the years, tried various insulin injections including NPH. Was on pump (  Medtronic) for about 5 years till about 2010 but didn't like it at all. Back on basal/bolus since then, but DM and sugars have been difficult to control per her report. She was seeing Dr Ernesta Amble at [facility] for her DM care, but unhappy with her service and hence changing. Used to see her every 3 months, last seen Sept 2015.  Patient is currently on basal/bolus regimen with  - Lantus 7 units BF, 8 units supper  - Humalog units with each meal based on carb calculations I: C BF 30, lunch 20, supper 30 Additional sliding scale  180-200= 1 unit 200-250=2 units 251-300 =3 units and so on Taking 2 units BF for ~48 carbs, 2-3 units L, 3 units supper, 1 units bedtime snack on an average   her most recent  A1cs were recorded at 7.5% Sept 2015  No results found for: HGBA1C     Patient checks her sugars 4-5  times daily with a relion glucometer. Brand meter expensive. By sugar log  her sugars are-  PREMEAL Breakfast Lunch Dinner Bedtime Overall  Glucose range: 78-327 47-391 135-353 52-239   Mean/median:        POST-MEAL PC Breakfast PC Lunch PC Dinner  Glucose range: 72 62   Mean/median:       Hypoglycemia-  Multiple recent  Lows spread throughout the day and 3/5 times post BF, about 25 minutes out. Lowest sugar was 47; she has hypoglycemia awareness at 70, but feels symptoms less these days. No previous hypoglycemia admission. Doesn't have  a glucagon kit at home. Recently called EMS  about 4 days ago for low sugar and passing out. Hyperglycemia- Highest sugar was 473 after missing insulin. No previous DKA admissions.    Dietary Habits- Eats three times daily. Does Carbohydrate counting. Limits Sodas/sweetened beverages/carbs. Eats out 2 x weekly, more difficult for her to estimate carbs then. Lactose intolerant. Has sweet and low.  Exercise- Walks 4-5 times weekly. TD and weight exercises. Now going to get a puppy and plans to add a walk.  Thursday evening does house cleaning and inspite of taking less bolus at supper time, finds herself going low when starts to clean.   Weight-  Wt Readings from Last 3 Encounters:  11/15/14 129 lb 8 oz (58.741 kg)  07/19/14 128 lb 12.8 oz (58.423 kg)  12/29/13 124 lb 12 oz (56.586 kg)    Diabetes Complications-  Nephropathy-- No  CKD, last BUN/creatinine- Lab Results  Component Value Date   BUN 21 12/29/2013   CREATININE 1.0 12/29/2013   Lab Results  Component Value Date   GFR 62.16 12/29/2013    Retinopathy- Yes, Last DEE was recently, seen every year Neuropathy- no numbness and tingling in her feet. Associated history - No history of CAD or prior stroke. Has Hypertension. No hypothyroidism.  her last TSH was  Lab Results  Component Value Date   TSH 1.20 12/29/2013   Hyperlipidemia- her last set of lipids were- No results found for: CHOL,  HDL, LDLCALC, LDLDIRECT, TRIG, CHOLHDL  Was on crestor till Sept 2015, but started to have muscle cramping- hence it was stopped. No difference noted by her in her myalgias- was taking Zetia till about 2 weeks ago- but stopped that as well to see if muscle symptoms improve. Still with persistent symptoms and going to see PCP. No hx Vit D deficiency, takes MVI daily.   I have reviewed the patient's past medical history, family and social history, surgical history, medications and allergies.    Past Medical History  Diagnosis Date  . Type 1 diabetes mellitus with diabetic retinopathy  without macular edema     dx at 54 y/o  . History of diabetic gastroparesis   . HLD (hyperlipidemia)     borderline  . HTN (hypertension)     borderline  . Nephrolithiasis 2005  . Hx: UTI (urinary tract infection)   . Disorder of kidney     has abd kidney (right)  . Systolic murmur 12/7780    mild mitral insuff  . IBS (irritable bowel syndrome)   . Osteopenia 2012    per pt   Past Surgical History  Procedure Laterality Date  . Tonsillectomy and adenoidectomy  1975  . Cesarean section  1985  . Tibia fracture surgery Left 1996  . Partial hysterectomy  2000    paps by [place]  . Trigger finger release Left 12/2010    thumb   Family History  Problem Relation Age of Onset  . Diabetes Maternal Grandmother   . Heart failure Maternal Grandmother   . COPD Maternal Grandmother     smoker  . Thyroid disease Maternal Grandmother   . Colon cancer Maternal Grandfather   . Vascular Disease Maternal Grandmother    History   Social History  . Marital Status: Married    Spouse Name: N/A    Number of Children: 1  . Years of Education: N/A   Occupational History  . HR director Other    [place] History Main Topics  . Smoking status: Never Smoker   . Smokeless tobacco: Never Used  . Alcohol Use: No  . Drug Use: No  . Sexual Activity: Not on file   Other Topics Concern  . 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 aspirin 81 MG tablet Take 81 mg by mouth daily.      . cholecalciferol (VITAMIN D) 1000 UNITS tablet Take 1,000 Units by mouth daily.    Marland Kitchen ezetimibe (ZETIA) 10 MG tablet Take 10 mg by mouth daily.    . insulin aspart (NOVOLOG) 100 UNIT/ML injection Inject into the skin as directed. Sliding scale     . Insulin Pen Needle (NOVOFINE)  32G X 6 MM MISC To be used with insulin injections up to 6 times daily 200 each 3  . Multiple Vitamin (MULTIVITAMIN) tablet Take 1 tablet by mouth daily.      . quinapril (ACCUPRIL) 10 MG tablet Take 10 mg by mouth at bedtime.       No current facility-administered medications on file prior to visit.   Allergies  Allergen Reactions  . Lactose Intolerance (Gi)       REVIEW OF SYSTEMS- Review of Systems: [x]   complains of  [  ] denies General:   [  ] Recent weight change [  ] Fatigue  [  ] Loss of appetite Eyes: [  ]  Vision Difficulty [  ]  Eye pain ENT: [  ]  Hearing difficulty [  ]  Difficulty Swallowing CVS: [  ] Chest pain [  ]  Palpitations/Irregular Heart beat [  ]  Shortness of breath lying flat [  ] Swelling of legs Resp: [  ] Frequent Cough [  ] Shortness of Breath  [  ]  Wheezing GI: [  ] Heartburn  [  ] Nausea or Vomiting  [  ] Diarrhea [  ] Constipation  [  ] Abdominal Pain GU: [  ]  Polyuria  [  ]  nocturia Bones/joints:  [x  ]  Muscle aches  [  ] Joint Pain  [  ] Bone pain Skin/Hair/Nails: [  ]  Rash  [  ] New stretch marks [  ]  Itching [  ] Hair loss [  ]  Excessive hair growth Reproduction: [  ] Low sexual desire , [  ]  Women: Menstrual cycle problems [  ]  Women: Breast Discharge [  ] Men: Difficulty with erections [  ]  Men: Enlarged Breasts CNS: [  ] Frequent Headaches [  ] Blurry vision [  ] Tremors [  ] Seizures [  ] Loss of consciousness [  ] Localized weakness Endocrine: [  ]  Excess thirst [  ]  Feeling excessively hot [  ]  Feeling excessively cold Heme: [  ]  Easy bruising [  ]  Enlarged glands or lumps in neck Allergy: [  ]  Food allergies [  ] Environmental allergies  PHYSICAL EXAM- BP 128/76 mmHg  Pulse 77  Ht 5' 4"  (1.626 m)  Wt 129 lb 8 oz (58.741 kg)  BMI 22.22 kg/m2  SpO2 98% Wt Readings from Last 3 Encounters:  11/15/14 129 lb 8 oz (58.741 kg)  07/19/14 128 lb 12.8 oz (58.423 kg)  12/29/13 124 lb 12 oz (56.586 kg)   GENERAL: No acute  distress HEENT:  Eye exam shows normal external appearance. Oral exam shows normal mucosa .  NECK:   Neck exam shows no lymphadenopathy. No Carotids bruits. Thyroid is not enlarged and no nodules felt.   LUNGS:         Chest is symmetrical. Lungs are clear to auscultation.Marland Kitchen   HEART:         Heart sounds:  S1 and S2 are normal. Systolic murmur+. ABDOMEN:  No Distention present. Liver and spleen are not palpable. No other mass or tenderness present.  EXTREMITIES:     There is no edema. No skin lesions present. 2+ DP.Marland Kitchen  NEUROLOGICAL:     Grossly intact. 2+ reflexes at biceps bilaterally.             Diabetic foot exam done with shoes and socks removed: Normal Monofilament testing bilaterally. No deformity of Toes.  Skin color normal.  MUSCULOSKELETAL:       There is no enlargement or deformity of the joints.  SKIN:       No rash, lesions        ASSESSMENT/PLAN-  Problem List Items Addressed This Visit      Cardiovascular and Mediastinum   Essential hypertension    BP at target today on Quinapril. Update urine MA at this visit.     Relevant  Orders      TSH      T4, free      Comprehensive metabolic panel      Hemoglobin A1c      Microalbumin / creatinine urine ratio      Vit D  25 hydroxy (rtn osteoporosis monitoring)     Endocrine   Type 1 diabetes mellitus with mild nonproliferative diabetic retinopathy and without macular edema - Primary    Update A1c today. Recent sugars with mixed pattern of lows/high through the day. First, it would be important to reduce lows over the next few weeks.  Has been taking slightly more basal insulin wrt bolus, will cut back on Lantus dosing to 12 units at lunch time.  Discussed hypoglycemia recognition and treatment protocol- Glucagon emergency script issued   Discussed eating out and cutting back on foods with tomato sauce and portion sizes.   Discussed taking Humalog at end of meal since Gastroparesis.  Changed ISF to 60 on sliding scale as  follows-  Additional sliding scale based on pre meal sugars- 150-210= 1 unit 210-270=2units 270-330=3 units 330-390=4 units 390-450=5 units  I:C= 30 grams for BF, 20 grams for lunch , 30 grams supper   Check sugars 4 x daily.  Also discussed Toujeo at her request, and future use of insulin pump/CGM if continues to have brittle control of her DM. RTC 1 month with meter/log    Relevant Medications      insulin glargine (LANTUS) 100 UNIT/ML injection      glucagon (GLUCAGON EMERGENCY) 1 MG injection   Other Relevant Orders      TSH      T4, free      Comprehensive metabolic panel      Hemoglobin A1c      Microalbumin / creatinine urine ratio      Vit D  25 hydroxy (rtn osteoporosis monitoring)     Other   Hyperlipidemia    Has been recently off Zetia at this time. Going to see PCP for evaluation of leg cramping. At next visits, consider restarting statin. Check Vitamin D to rule out deficiency causing myalgias.     Relevant Orders      TSH      T4, free      Comprehensive metabolic panel      Hemoglobin A1c      Microalbumin / creatinine urine ratio      Vit D  25 hydroxy (rtn osteoporosis monitoring)        - Return to clinic in 1 month with meter   Ashok Sawaya [facility] 11/15/2014, 12:16 PM

## 2014-11-15 NOTE — Progress Notes (Signed)
Pre visit review using our clinic review tool, if applicable. No additional management support is needed unless otherwise documented below in the visit note. 

## 2014-11-15 NOTE — Assessment & Plan Note (Signed)
BP at target today on Quinapril. Update urine MA at this visit.

## 2014-11-15 NOTE — Assessment & Plan Note (Addendum)
Update A1c today. Recent sugars with mixed pattern of lows/high through the day. First, it would be important to reduce lows over the next few weeks.  Has been taking slightly more basal insulin wrt bolus, will cut back on Lantus dosing to 12 units at lunch time.  Discussed hypoglycemia recognition and treatment protocol- Glucagon emergency script issued   Discussed eating out and cutting back on foods with tomato sauce and portion sizes.   Discussed taking Humalog at end of meal since Gastroparesis.  Changed ISF to 60 on sliding scale as follows-  Additional sliding scale based on pre meal sugars- 150-210= 1 unit 210-270=2units 270-330=3 units 330-390=4 units 390-450=5 units  I:C= 30 grams for BF, 20 grams for lunch , 30 grams supper   Check sugars 4 x daily.  Also discussed Toujeo at her request, and future use of insulin pump/CGM if continues to have brittle control of her DM. RTC 1 month with meter/log

## 2014-11-15 NOTE — Patient Instructions (Signed)
Check sugars 4 x daily ( before each meal and at bedtime).  Record them in a log book and bring that/meter to next appointment.   Cut back to Lantus 12 units [place] at lunch time.  Take 5 units tonight and start at 12 units from tomorrow.    Adjust the Humalog scale as follows- 30 grams for BF, 20 grams for lunch , 30 grams supper for 1 units insulin Additional sliding scale based on pre meal sugars- 150-210= 1 unit 210-270=2units 270-330=3 units 330-390=4 units 390-450=5 units  Also try taking Humalog at end of meal based on premeal sugars to see whether fewer lows.  Please come back for a follow-up appointment in 1 month.

## 2014-11-16 LAB — MICROALBUMIN / CREATININE URINE RATIO
Creatinine,U: 46.2 mg/dL
Microalb Creat Ratio: 0.2 mg/g (ref 0.0–30.0)
Microalb, Ur: 0.1 mg/dL (ref 0.0–1.9)

## 2014-12-02 ENCOUNTER — Ambulatory Visit: Payer: No Typology Code available for payment source | Admitting: Family Medicine

## 2014-12-13 ENCOUNTER — Encounter: Payer: Self-pay | Admitting: Endocrinology

## 2014-12-13 ENCOUNTER — Ambulatory Visit (INDEPENDENT_AMBULATORY_CARE_PROVIDER_SITE_OTHER): Payer: No Typology Code available for payment source | Admitting: Endocrinology

## 2014-12-13 VITALS — BP 122/76 | HR 78 | Resp 14 | Ht 64.0 in | Wt 125.0 lb

## 2014-12-13 DIAGNOSIS — E10329 Type 1 diabetes mellitus with mild nonproliferative diabetic retinopathy without macular edema: Secondary | ICD-10-CM

## 2014-12-13 DIAGNOSIS — E785 Hyperlipidemia, unspecified: Secondary | ICD-10-CM

## 2014-12-13 DIAGNOSIS — E103299 Type 1 diabetes mellitus with mild nonproliferative diabetic retinopathy without macular edema, unspecified eye: Secondary | ICD-10-CM

## 2014-12-13 DIAGNOSIS — I10 Essential (primary) hypertension: Secondary | ICD-10-CM

## 2014-12-13 MED ORDER — EZETIMIBE 10 MG PO TABS: 10.0000 mg | ORAL_TABLET | Freq: Every day | ORAL | Status: DC

## 2014-12-13 NOTE — Patient Instructions (Signed)
Check sugars 4 x daily ( before each meal and at bedtime).  Record them in a log book and bring that/meter to next appointment.   Change Lantus to 6 units morning and 5 units at night time ( bedtime). If morning numbers are still high then increase 6 units at night time.   Change Humalog scale to  Additional sliding scale based on pre meal sugars- 120-150= 1 unit 150-210= 2 unit 210-270=3units 270-330=4 units 330-390=5 units 390-450=6 units  I:C= 15 grams for BF, 20 grams for lunch , 20 grams supper  If start to have lows at lunch and dinner then change I:C back to 25 for the 2 meals. If still low then please let me know.   Please come back for a follow-up appointment in 1 month.  Refill zetia.

## 2014-12-13 NOTE — Progress Notes (Signed)
Pre visit review using our clinic review tool, if applicable. No additional management support is needed unless otherwise documented below in the visit note. 

## 2014-12-13 NOTE — Assessment & Plan Note (Signed)
BP at target today on Quinapril. Urine MA normal recently.      

## 2014-12-13 NOTE — Progress Notes (Signed)
REASON FOR VISIT- Felicia [name] is a 54 y.o.-year-old female, for follow up management of Type 1 Diabetes Mellitus, uncontrolled, with complications ( retinopathy, gastroparesis, ? Developing Stage 2 CKD). Last visit Jan 2016.   HPI- Patient recalls being diagnosed with diabetes around age 51. Following this, she started on insulin therapy at diagnosis. Over the years, tried various insulin injections including NPH. Was on pump (  Medtronic) for about 5 years till about 2010 but didn't like it at all. Back on basal/bolus since then, but DM and sugars have been difficult to control per her report. She was seeing Dr Zenaida Niece at [facility] for her DM care, but unhappy with her service and hence changing.   Patient is currently on basal/bolus regimen with  - Lantus 7 units BF, 8 units supper >12 units lunch  ( that caused very high sugars in the 400-500 range) >> now back to BID regimen at 6 units BF and 4 un its with supper.  - Humalog units with each meal based on carb calculations( end meal) Additional sliding scale based on pre meal sugars- 150-210= 1 unit 210-270=2units 270-330=3 units 330-390=4 units 390-450=5 units  I:C= 30 grams for BF, 20 grams for lunch , 30 grams supper >> now taking I:C of 15 at all meals since last week and hasn't noticed too much difference in her sugars  Generally needing around 5 units with each meal now.    her most recent  A1cs were recorded at 7.5% Sept 2015   Lab Results  Component Value Date   HGBA1C 8.2* 11/15/2014       Patient checks her sugars 4-5  times daily with a relion glucometer. Brand meter expensive. By sugar log  her sugars are-  PREMEAL Breakfast Lunch Dinner Bedtime Overall  Glucose range: 93-287 96-381 75-281 96-199   Mean/median:        POST-MEAL PC Breakfast PC Lunch PC Dinner  Glucose range:     Mean/median:      * numbers are better towards end of day. * lesser hypoglycemia   Hypoglycemia-  decreased  recent  Mild hypoglycemia at dinner and bedtime. Lowest sugar was 59; she has hypoglycemia awareness at 70, but feels symptoms less these days. No previous hypoglycemia admission. hasn't called EMS since last visit. Hyperglycemia-  No previous DKA admissions.    Dietary Habits- Eats three times daily. Does Carbohydrate counting. Limits Sodas/sweetened beverages/carbs. Eats out 2 x weekly, more difficult for her to estimate carbs then. Lactose intolerant. Has sweet and low. Now even better with carb counting.  Exercise- Walks 4-5 times weekly. TD and weight exercises. Now has a puppy and added a walk.  Thursday evening does house cleaning and inspite of taking less bolus at supper time, finds herself going low when starts to clean.   Weight-  Wt Readings from Last 3 Encounters:  12/13/14 125 lb (56.7 kg)  11/15/14 129 lb 8 oz (58.741 kg)  07/19/14 128 lb 12.8 oz (58.423 kg)    Diabetes Complications-  Nephropathy-- ? Developing Stage2  CKD, last BUN/creatinine- Lab Results  Component Value Date   BUN 14 11/15/2014   CREATININE 1.0 11/15/2014   Lab Results  Component Value Date   GFR 65.72 11/15/2014    Retinopathy- Yes, Last DEE was recently Dec 2015, seen every year Neuropathy- no numbness and tingling in her feet. Associated history - No history of CAD or prior stroke. Has Hypertension. No hypothyroidism.  her last TSH was  Lab Results  Component Value Date   TSH 1.94 11/15/2014   Hyperlipidemia- her last set of lipids were- No results found for: CHOL, HDL, LDLCALC, LDLDIRECT, TRIG, CHOLHDL  Was on crestor till Sept 2015, but started to have muscle cramping- hence it was stopped. No difference noted by her in her myalgias- was taking Zetia till prior to last visit in Jan- but stopped that as well to see if muscle symptoms improve. Restarted zetia last week. Now muscle aches better after starting Vitamin D 1200 units daily. RF on Zetia today.  I have reviewed the patient's past  medical history, medications and allergies.     Current Outpatient Prescriptions on File Prior to Visit  Medication Sig Dispense Refill  . aspirin 81 MG tablet Take 81 mg by mouth daily.      . cholecalciferol (VITAMIN D) 1000 UNITS tablet Take 1,000 Units by mouth daily.    Marland Kitchen. glucagon (GLUCAGON EMERGENCY) 1 MG injection Inject 1 mg into the muscle once as needed (in case of severe hypoglycemia). 1 each 12  . insulin glargine (LANTUS) 100 UNIT/ML injection Inject 8 Units into the skin at bedtime. 7 units with breakfast and 8 units at bedtime.    . Insulin Pen Needle (NOVOFINE) 32G X 6 MM MISC To be used with insulin injections up to 6 times daily 200 each 3  . Multiple Vitamin (MULTIVITAMIN) tablet Take 1 tablet by mouth daily.      . quinapril (ACCUPRIL) 10 MG tablet Take 10 mg by mouth at bedtime.       No current facility-administered medications on file prior to visit.   Allergies  Allergen Reactions  . Lactose Intolerance (Gi)     Review of Systems- [ x ]  Complains of    [  ]  denies [  ] Recent weight change [  ]  Fatigue [  ] polydipsia [  ] polyuria [  ]  nocturia [  ]  vision difficulty [  ] chest pain [  ] shortness of breath [  ] leg swelling [  ] cough [  ] nausea/vomiting [  ] diarrhea [  ] constipation [  ] abdominal pain [  ]  tingling/numbness in extremities [  ]  concern with feet ( wounds/sores)   PHYSICAL EXAM- BP 122/76 mmHg  Pulse 78  Resp 14  Ht 5\' 4"  (1.626 m)  Wt 125 lb (56.7 kg)  BMI 21.45 kg/m2  SpO2 99% Wt Readings from Last 3 Encounters:  12/13/14 125 lb (56.7 kg)  11/15/14 129 lb 8 oz (58.741 kg)  07/19/14 128 lb 12.8 oz (58.423 kg)   Exam: deferred     ASSESSMENT/PLAN-  Problem List Items Addressed This Visit      Cardiovascular and Mediastinum   Essential hypertension    BP at target today on Quinapril. Urine MA normal recently.         Relevant Medications   ezetimibe (ZETIA) 10 MG tablet     Endocrine   Type 1 diabetes  mellitus with mild nonproliferative diabetic retinopathy and without macular edema - Primary    Recent A1c elevated, higher sugars during start of day probably due to increased insulin resistance versus higher carb meals.  Check sugars 4 x daily ( before each meal and at bedtime).  Record them in a log book and bring that/meter to next appointment.   Change Lantus to 6 units morning and 5 units at night time ( bedtime). If  morning numbers are still high then increase 6 units at night time.   Change Humalog scale to  Additional sliding scale based on pre meal sugars- 120-150= 1 unit 150-210= 2 unit 210-270=3units 270-330=4 units 330-390=5 units 390-450=6 units  I:C= 15 grams for BF, 20 grams for lunch , 20 grams supper  If start to have lows at lunch and dinner then change I:C back to 25 for the 2 meals. If still low then please let me know.   RTC 1 month with meter/log        Relevant Medications   insulin lispro (HUMALOG) 100 UNIT/ML KiwkPen   ezetimibe (ZETIA) 10 MG tablet     Other   Hyperlipidemia    Restarted Zetia at this time. Improved myalgias with Vitamin D. Refill zetia.  At next visits, consider restarting statin. Recheck lipids 3 months.      Relevant Medications   ezetimibe (ZETIA) 10 MG tablet        - Return to clinic in 1 month with meter   Rickelle Sylvestre [facility] 12/13/2014, 9:06 AM

## 2014-12-13 NOTE — Assessment & Plan Note (Signed)
Recent A1c elevated, higher sugars during start of day probably due to increased insulin resistance versus higher carb meals.  Check sugars 4 x daily ( before each meal and at bedtime).  Record them in a log book and bring that/meter to next appointment.   Change Lantus to 6 units morning and 5 units at night time ( bedtime). If morning numbers are still high then increase 6 units at night time.   Change Humalog scale to  Additional sliding scale based on pre meal sugars- 120-150= 1 unit 150-210= 2 unit 210-270=3units 270-330=4 units 330-390=5 units 390-450=6 units  I:C= 15 grams for BF, 20 grams for lunch , 20 grams supper  If start to have lows at lunch and dinner then change I:C back to 25 for the 2 meals. If still low then please let me know.   RTC 1 month with meter/log

## 2014-12-13 NOTE — Assessment & Plan Note (Addendum)
Restarted Zetia at this time. Improved myalgias with Vitamin D. Refill zetia.  At next visits, consider restarting statin. Recheck lipids 3 months.

## 2015-01-10 ENCOUNTER — Ambulatory Visit: Payer: No Typology Code available for payment source | Admitting: Endocrinology

## 2015-01-18 LAB — HM MAMMOGRAPHY: HM Mammogram: NORMAL

## 2015-01-25 ENCOUNTER — Other Ambulatory Visit: Payer: Self-pay

## 2015-01-25 MED ORDER — QUINAPRIL HCL 10 MG PO TABS: 10.0000 mg | ORAL_TABLET | Freq: Every day | ORAL | Status: DC

## 2015-02-07 ENCOUNTER — Ambulatory Visit
Admit: 2015-02-07 | Disposition: A | Discharge status: Home / Self Care | Payer: Self-pay | Attending: Obstetrics and Gynecology | Admitting: Obstetrics and Gynecology

## 2015-02-16 ENCOUNTER — Ambulatory Visit: Payer: No Typology Code available for payment source | Admitting: Endocrinology

## 2015-03-02 ENCOUNTER — Ambulatory Visit: Payer: No Typology Code available for payment source | Admitting: Endocrinology

## 2015-03-08 ENCOUNTER — Encounter: Payer: Self-pay | Admitting: Endocrinology

## 2015-03-08 ENCOUNTER — Ambulatory Visit (INDEPENDENT_AMBULATORY_CARE_PROVIDER_SITE_OTHER): Payer: No Typology Code available for payment source | Admitting: Endocrinology

## 2015-03-08 VITALS — BP 118/60 | HR 75 | Resp 12 | Ht 64.0 in | Wt 127.4 lb

## 2015-03-08 DIAGNOSIS — E10329 Type 1 diabetes mellitus with mild nonproliferative diabetic retinopathy without macular edema: Secondary | ICD-10-CM | POA: Diagnosis not present

## 2015-03-08 DIAGNOSIS — I10 Essential (primary) hypertension: Secondary | ICD-10-CM

## 2015-03-08 DIAGNOSIS — E785 Hyperlipidemia, unspecified: Secondary | ICD-10-CM

## 2015-03-08 DIAGNOSIS — E103299 Type 1 diabetes mellitus with mild nonproliferative diabetic retinopathy without macular edema, unspecified eye: Secondary | ICD-10-CM

## 2015-03-08 MED ORDER — "INSULIN SYRINGE-NEEDLE U-100 31G X 15/64"" 0.3 ML MISC": Status: DC

## 2015-03-08 MED ORDER — INSULIN REGULAR HUMAN 100 UNIT/ML IJ SOLN: INTRAMUSCULAR | Status: DC

## 2015-03-08 MED ORDER — INSULIN GLARGINE 100 UNIT/ML ~~LOC~~ SOLN: SUBCUTANEOUS | Status: DC

## 2015-03-08 NOTE — Progress Notes (Signed)
Pre visit review using our clinic review tool, if applicable. No additional management support is needed unless otherwise documented below in the visit note. 

## 2015-03-08 NOTE — Patient Instructions (Signed)
Change to lantus 6 units morning and 7 units at night time.  Change to regular insulin on same scale as Humalog.  Switch to vial forms.  Labs fasting next week.  Please come back for a follow-up appointment in 6 weeks

## 2015-03-08 NOTE — Progress Notes (Signed)
REASON FOR VISIT- Felicia DallyDebra L [name] is a 54 y.o.-year-old female, for follow up management of Type 1 Diabetes Mellitus, uncontrolled, with complications ( retinopathy, gastroparesis, ? Developing Stage 2 CKD). Last visit Feb 2016.   HPI- Patient recalls being diagnosed with diabetes around age 54. Following this, she started on insulin therapy at diagnosis. Over the years, tried various insulin injections including NPH. Was on pump (  Medtronic) for about 5 years till about 2010 but didn't like it at all. Back on basal/bolus since then, but DM and sugars have been difficult to control per her report. She was seeing [facility]ena[facility] Nieceracy Black [facility] for her DM care, but unhappy with her service and hence changing.   Patient is currently on basal/bolus regimen with  - Lantus 7 units BF, 8 units supper >12 units lunch  ( that caused very high sugars in the 400-500 range) >> now back to BID regimen at 6 units BF and 6 un its with supper.  - Humalog units with each meal based on carb calculations Additional sliding scale based on pre meal sugars- 120-150= 1 unit 150-210= 2 unit 210-270=3units 270-330=4 units 330-390=5 units 390-450=6 units  I:C= 15 grams for BF, 20 grams for lunch , 20 grams supper      her most recent  A1cs were recorded at 7.5% Sept 2015   Lab Results  Component Value Date   HGBA1C 8.2* 11/15/2014       Patient checks her sugars 4-5  times daily with a relion glucometer. Brand meter expensive. By sugar log  her sugars are-  PREMEAL Breakfast Lunch Dinner Bedtime Overall  Glucose range: 66-365 71-431 70-430 79-495   Mean/median:        POST-MEAL PC Breakfast PC Lunch PC Dinner  Glucose range:     Mean/median:      * numbers are quite variable even after the same meals/carbs * generally Humalog given after having relatively lower sugars prior to meal is not enough  Hypoglycemia-  decreased recent  Mild hypoglycemia . Lowest sugar was 50s; she has  hypoglycemia awareness at 70, but feels symptoms less these days. No previous hypoglycemia admission. hasn't called EMS since last visit. Hyperglycemia-  No previous DKA admissions.    Dietary Habits- Eats three times daily. Does Carbohydrate counting. Limits Sodas/sweetened beverages/carbs. Eats out 2 x weekly, more difficult for her to estimate carbs then. Lactose intolerant. Has sweet and low. Now even better with carb counting.  Exercise- Walks 4-5 times weekly. TD and weight exercises. Now has a puppy and added a walk.  Thursday evening does house cleaning and inspite of taking less bolus at supper time, finds herself going low when starts to clean.   Weight-  Wt Readings from Last 3 Encounters:  03/08/15 127 lb 6.4 oz (57.788 kg)  12/13/14 125 lb (56.7 kg)  11/15/14 129 lb 8 oz (58.741 kg)    Diabetes Complications-  Nephropathy-- ? Developing Stage2  CKD, last BUN/creatinine- Lab Results  Component Value Date   BUN 14 11/15/2014   CREATININE 1.0 11/15/2014   Lab Results  Component Value Date   GFR 65.72 11/15/2014    Retinopathy- Yes, Last DEE was recently Dec 2015, seen every year Neuropathy- no numbness and tingling in her feet. Associated history - No history of CAD or prior stroke. Has Hypertension. No hypothyroidism.  her last TSH was  Lab Results  Component Value Date   TSH 1.94 11/15/2014   Hyperlipidemia- her last set  of lipids were- No results found for: CHOL, HDL, LDLCALC, LDLDIRECT, TRIG, CHOLHDL  Was on crestor till Sept 2015, but started to have muscle cramping- hence it was stopped. No difference noted by her in her myalgias- was taking Zetia till prior to last visit in Jan- but stopped that as well to see if muscle symptoms improve. Restarted zetia feb 2016. Now muscle aches better after starting Vitamin D 1200 units daily.   I have reviewed the patient's past medical history, medications and allergies.     Current Outpatient Prescriptions on File Prior  to Visit  Medication Sig Dispense Refill  . aspirin 81 MG tablet Take 81 mg by mouth daily.      . cholecalciferol (VITAMIN D) 1000 UNITS tablet Take 1,000 Units by mouth daily.    Marland Kitchen ezetimibe (ZETIA) 10 MG tablet Take 1 tablet (10 mg total) by mouth daily. 30 tablet 6  . glucagon (GLUCAGON EMERGENCY) 1 MG injection Inject 1 mg into the muscle once as needed (in case of severe hypoglycemia). 1 each 12  . Insulin Pen Needle (NOVOFINE) 32G X 6 MM MISC To be used with insulin injections up to 6 times daily 200 each 3  . Multiple Vitamin (MULTIVITAMIN) tablet Take 1 tablet by mouth daily.      . quinapril (ACCUPRIL) 10 MG tablet Take 1 tablet (10 mg total) by mouth at bedtime. 90 tablet 1   No current facility-administered medications on file prior to visit.   Allergies  Allergen Reactions  . Lactose Intolerance (Gi)     Review of Systems- [ x ]  Complains of    [  ]  denies [  ] Recent weight change [  ]  Fatigue [  ] polydipsia [  ] polyuria [  ]  nocturia [  ]  vision difficulty [  ] chest pain [  ] shortness of breath [  ] leg swelling [  ] cough [  ] nausea/vomiting [  ] diarrhea [  ] constipation [  ] abdominal pain [  ]  tingling/numbness in extremities [  ]  concern with feet ( wounds/sores)   PHYSICAL EXAM- BP 118/60 mmHg  Pulse 75  Resp 12  Ht  (1.626 m)  Wt 127 lb 6.4 oz (57.788 kg)  BMI 21.86 kg/m2  SpO2 95% Wt Readings from Last 3 Encounters:  03/08/15 127 lb 6.4 oz (57.788 kg)  12/13/14 125 lb (56.7 kg)  11/15/14 129 lb 8 oz (58.741 kg)   Exam: deferred     ASSESSMENT/PLAN-  Problem List Items Addressed This Visit      Cardiovascular and Mediastinum   Essential hypertension    BP at target today on Quinapril. Urine MA normal recently.           Relevant Orders   Comprehensive metabolic panel   Hemoglobin A1c   Lipid panel   Microalbumin / creatinine urine ratio     Endocrine   Type 1 diabetes mellitus with mild nonproliferative diabetic  retinopathy and without macular edema - Primary    Recent A1c elevated, variable sugars inspite of her getting better with carb counting.  Suspect that gastroparesis seems to be influencing her sugar control on certain days versus others.   Check sugars 4 x daily ( before each meal and at bedtime).  Record them in a log book and bring that/meter to next appointment.   Change Lantus to 6 units morning and 7 units at night time (  bedtime).   Change Humalog scale to  Regular scale to see if that works better with her gastroparesis Additional sliding scale based on pre meal sugars- 120-150= 1 unit 150-210= 2 unit 210-270=3units 270-330=4 units 330-390=5 units 390-450=6 units  I:C= 15 grams for BF, 20 grams for lunch , 20 grams supper  If sugars are still staying high- then will either consider a switch to once daily Toujeo. Also, will consider switching scale to start at  Fs100 ( she doesn't wish to change this now)   RTC 1 month with meter/log          Relevant Medications   insulin glargine (LANTUS) 100 UNIT/ML injection   insulin regular (HUMULIN R) 100 units/mL injection   Other Relevant Orders   Comprehensive metabolic panel   Hemoglobin A1c   Lipid panel   Microalbumin / creatinine urine ratio     Other   Hyperlipidemia    Restarted Zetia  Feb 2016-tolerating better if takes it [place]. Improved myalgias with Vitamin D. Recheck labs when fasting next week        Relevant Orders   Comprehensive metabolic panel   Hemoglobin A1c   Lipid panel   Microalbumin / creatinine urine ratio        - Return to clinic in 6 weeks with meter   Kimila Papaleo [facility] 03/09/2015, 4:45 PM

## 2015-03-09 NOTE — Assessment & Plan Note (Signed)
Restarted Zetia  Feb 2016-tolerating better if takes it around [place]. Improved myalgias with Vitamin D. Recheck labs when fasting next week

## 2015-03-09 NOTE — Assessment & Plan Note (Signed)
Recent A1c elevated, variable sugars inspite of her getting better with carb counting.  Suspect that gastroparesis seems to be influencing her sugar control on certain days versus others.   Check sugars 4 x daily ( before each meal and at bedtime).  Record them in a log book and bring that/meter to next appointment.   Change Lantus to 6 units morning and 7 units at night time ( bedtime).   Change Humalog scale to  Regular scale to see if that works better with her gastroparesis Additional sliding scale based on pre meal sugars- 120-150= 1 unit 150-210= 2 unit 210-270=3units 270-330=4 units 330-390=5 units 390-450=6 units  I:C= 15 grams for BF, 20 grams for lunch , 20 grams supper  If sugars are still staying high- then will either consider a switch to once daily Toujeo. Also, will consider switching scale to start at  Fs100 ( she doesn't wish to change this now)   RTC 1 month with meter/log

## 2015-03-09 NOTE — Assessment & Plan Note (Signed)
BP at target today on Quinapril. Urine MA normal recently.

## 2015-03-17 ENCOUNTER — Other Ambulatory Visit: Payer: No Typology Code available for payment source

## 2015-03-23 ENCOUNTER — Other Ambulatory Visit (INDEPENDENT_AMBULATORY_CARE_PROVIDER_SITE_OTHER): Payer: No Typology Code available for payment source

## 2015-03-23 DIAGNOSIS — E785 Hyperlipidemia, unspecified: Secondary | ICD-10-CM

## 2015-03-23 DIAGNOSIS — E10329 Type 1 diabetes mellitus with mild nonproliferative diabetic retinopathy without macular edema: Secondary | ICD-10-CM | POA: Diagnosis not present

## 2015-03-23 DIAGNOSIS — I10 Essential (primary) hypertension: Secondary | ICD-10-CM

## 2015-03-23 DIAGNOSIS — E103299 Type 1 diabetes mellitus with mild nonproliferative diabetic retinopathy without macular edema, unspecified eye: Secondary | ICD-10-CM

## 2015-03-23 LAB — COMPREHENSIVE METABOLIC PANEL
ALT: 12 U/L (ref 0–35)
AST: 20 U/L (ref 0–37)
Albumin: 3.7 g/dL (ref 3.5–5.2)
Alkaline Phosphatase: 49 U/L (ref 39–117)
BUN: 16 mg/dL (ref 6–23)
CO2: 31 mEq/L (ref 19–32)
Calcium: 8.7 mg/dL (ref 8.4–10.5)
Chloride: 102 mEq/L (ref 96–112)
Creatinine, Ser: 0.86 mg/dL (ref 0.40–1.20)
GFR: 73.62 mL/min (ref >60.00)
Glucose, Bld: 106 mg/dL — ABNORMAL HIGH (ref 70–99)
Potassium: 4 mEq/L (ref 3.5–5.1)
Sodium: 136 mEq/L (ref 135–145)
Total Bilirubin: 0.4 mg/dL (ref 0.2–1.2)
Total Protein: 6.4 g/dL (ref 6.0–8.3)

## 2015-03-23 LAB — LIPID PANEL
Cholesterol: 191 mg/dL (ref 0–200)
HDL: 73.1 mg/dL (ref >39.00)
LDL Cholesterol: 105 mg/dL — ABNORMAL HIGH (ref 0–99)
NonHDL: 117.9
Total CHOL/HDL Ratio: 3
Triglycerides: 66 mg/dL (ref 0.0–149.0)
VLDL: 13.2 mg/dL (ref 0.0–40.0)

## 2015-03-23 LAB — MICROALBUMIN / CREATININE URINE RATIO
Creatinine,U: 40.2 mg/dL
Microalb Creat Ratio: 1.7 mg/g (ref 0.0–30.0)
Microalb, Ur: <0.7 mg/dL (ref 0.0–1.9)

## 2015-03-23 LAB — HEMOGLOBIN A1C: Hgb A1c MFr Bld: 7.5 % — ABNORMAL HIGH (ref 4.6–6.5)

## 2015-04-06 ENCOUNTER — Other Ambulatory Visit: Payer: Self-pay | Admitting: *Deleted

## 2015-04-06 MED ORDER — INSULIN GLARGINE 100 UNIT/ML ~~LOC~~ SOLN: SUBCUTANEOUS | Status: DC

## 2015-04-20 ENCOUNTER — Encounter: Payer: Self-pay | Admitting: Endocrinology

## 2015-04-20 ENCOUNTER — Ambulatory Visit (INDEPENDENT_AMBULATORY_CARE_PROVIDER_SITE_OTHER): Payer: No Typology Code available for payment source | Admitting: Endocrinology

## 2015-04-20 VITALS — BP 106/58 | HR 72 | Resp 14 | Ht 64.0 in | Wt 125.5 lb

## 2015-04-20 DIAGNOSIS — I10 Essential (primary) hypertension: Secondary | ICD-10-CM | POA: Diagnosis not present

## 2015-04-20 DIAGNOSIS — E785 Hyperlipidemia, unspecified: Secondary | ICD-10-CM | POA: Diagnosis not present

## 2015-04-20 DIAGNOSIS — E10329 Type 1 diabetes mellitus with mild nonproliferative diabetic retinopathy without macular edema: Secondary | ICD-10-CM

## 2015-04-20 DIAGNOSIS — E103299 Type 1 diabetes mellitus with mild nonproliferative diabetic retinopathy without macular edema, unspecified eye: Secondary | ICD-10-CM

## 2015-04-20 MED ORDER — QUINAPRIL HCL 10 MG PO TABS: 10.0000 mg | ORAL_TABLET | Freq: Every day | ORAL | Status: DC

## 2015-04-20 MED ORDER — INSULIN GLARGINE 300 UNIT/ML ~~LOC~~ SOPN: 10.0000 [IU] | PEN_INJECTOR | Freq: Every morning | SUBCUTANEOUS | Status: DC

## 2015-04-20 NOTE — Progress Notes (Signed)
Pre visit review using our clinic review tool, if applicable. No additional management support is needed unless otherwise documented below in the visit note. 

## 2015-04-20 NOTE — Patient Instructions (Addendum)
Check sugars 4 x daily ( before breakfast and before supper).  Record them in a log book and bring that/meter to next appointment.   Change to Toujeo to 10 units each morning.  For tonight, take lantus 2 units instead of 7 units that you usually take.  From tomorrow, start Toujeo at 9 units and then increase to 10 units daily.   Stay on same regular insulin scale with meals.  For night time, don't correct sugars unless they are above 300 300-360: 1 units 360-420: 2 units and so on  If you get lows after changing to Toujeo, decrease dose to 9 units daily.   Please let me know how you are doing in 1-2 weeks.   Please come back for a follow-up appointment in 6 weeks

## 2015-04-20 NOTE — Progress Notes (Signed)
REASON FOR VISIT- Felicia [name] is a 54 y.o.-year-old female, for follow up management of Type 1 Diabetes Mellitus, uncontrolled, with complications ( retinopathy, gastroparesis, ? Developing Stage 2 CKD). Last visit May 2016.   HPI- Patient recalls being diagnosed with diabetes around age 21. Following this, she started on insulin therapy at diagnosis. Over the years, tried various insulin injections including NPH. Was on pump (  Medtronic) for about 5 years till about 2010 but didn't like it at all. Back on basal/bolus since then, but DM and sugars have been difficult to control per her report. She was seeing Dr Zenaida Niece at [facility] for her DM care, but unhappy with her service and hence changing.   *changed from Humalog to regular insulin May 2016 to see if that works better with gastroparesis>>feels that her numbers are btter  Patient is currently on basal/bolus regimen with  - Lantus 7 units BF, 8 units supper >12 units lunch  ( that caused very high sugars in the 400-500 range) >> now back to BID regimen at 6 units BF and 7 units with supper.  - regualar units with each meal based on carb calculations Additional sliding scale based on pre meal sugars- 120-150= 1 unit 150-210= 2 unit 210-270=3units 270-330=4 units 330-390=5 units 390-450=6 units  I:C= 15 grams for BF>>30 as finding needing less insulin the morning, 20 grams for lunch , 20 grams supper  Also having lower sugars in the qhs and am. Also giving some corrective insulin doses for higher sugars at bedtime Just needing about 6 units of regular during the day    her most recent  A1cs were recorded at    Lab Results  Component Value Date   HGBA1C 7.5* 03/23/2015   HGBA1C 8.2* 11/15/2014   7.5% Sept 2015    Patient checks her sugars 4-5  times daily with a relion glucometer. Brand meter expensive. By sugar log  her sugars are-  PREMEAL Breakfast Lunch Dinner Bedtime Overall  Glucose range: 46-276  50-346 105-270 64-442   Mean/median:        POST-MEAL PC Breakfast PC Lunch PC Dinner  Glucose range:     Mean/median:        Hypoglycemia-  decreased recent  Mild-mod hypoglycemia . Lowest sugar was 40s; she has hypoglycemia awareness at 70, but feels symptoms less these days. No previous hypoglycemia admission. hasn't called EMS since last visit. Hyperglycemia-  No previous DKA admissions.    Dietary Habits- Eats three times daily. Does Carbohydrate counting. Limits Sodas/sweetened beverages/carbs. Eats out 2 x weekly, more difficult for her to estimate carbs then. Lactose intolerant. Has sweet and low. Now even better with carb counting.  Exercise- Walks 4-5 times weekly. TD and weight exercises. Now has a puppy and added a walk.  Thursday evening does house cleaning and inspite of taking less bolus at supper time, finds herself going low when starts to clean.   Weight-  Wt Readings from Last 3 Encounters:  04/20/15 125 lb 8 oz (56.926 kg)  03/08/15 127 lb 6.4 oz (57.788 kg)  12/13/14 125 lb (56.7 kg)    Diabetes Complications-  Nephropathy-- ? Developing Stage2  CKD, last BUN/creatinine- Lab Results  Component Value Date   BUN 19 04/21/2015   CREATININE 1.13* 04/21/2015   Lab Results  Component Value Date   GFR 73.62 03/23/2015    Retinopathy- Yes, Last DEE was recently Dec 2015, seen every year Neuropathy- no numbness and tingling in her  feet. Associated history - No history of CAD or prior stroke. Has Hypertension. No hypothyroidism.  her last TSH was  Lab Results  Component Value Date   TSH 1.94 11/15/2014   Hyperlipidemia- her last set of lipids were-  Lab Results  Component Value Date   CHOL 191 03/23/2015   HDL 73.10 03/23/2015   LDLCALC 105* 03/23/2015   TRIG 66.0 03/23/2015   CHOLHDL 3 03/23/2015    Was on crestor till Sept 2015, but started to have muscle cramping- hence it was stopped. No difference noted by her in her myalgias- was taking Zetia till  prior to last visit in Jan- but stopped that as well to see if muscle symptoms improve. Restarted zetia feb 2016. Now muscle aches better after starting Vitamin D 1200 units daily.   I have reviewed the patient's past medical history, medications and allergies.     Current Outpatient Prescriptions on File Prior to Visit  Medication Sig Dispense Refill  . aspirin 81 MG tablet Take 81 mg by mouth daily.      . cholecalciferol (VITAMIN D) 1000 UNITS tablet Take 1,000 Units by mouth daily.    Marland Kitchen ezetimibe (ZETIA) 10 MG tablet Take 1 tablet (10 mg total) by mouth daily. 30 tablet 6  . glucagon (GLUCAGON EMERGENCY) 1 MG injection Inject 1 mg into the muscle once as needed (in case of severe hypoglycemia). 1 each 12  . Insulin Pen Needle (NOVOFINE) 32G X 6 MM MISC To be used with insulin injections up to 6 times daily 200 each 3  . insulin regular (HUMULIN R) 100 units/mL injection Based on sliding scale and carbs (Patient taking differently: Inject 8 Units into the skin 3 (three) times daily before meals. Based on sliding scale and carbs) 10 mL 3  . Insulin Syringe-Needle U-100 (BD INSULIN SYRINGE ULTRAFINE) 31G X 15/64" 0.3 ML MISC Use for insulin injections 6 times daily 200 each 6  . Multiple Vitamin (MULTIVITAMIN) tablet Take 1 tablet by mouth daily.       No current facility-administered medications on file prior to visit.   Allergies  Allergen Reactions  . Toujeo Solostar [Insulin Glargine] Shortness Of Breath  . Lactose Intolerance (Gi)     Review of Systems- [ x ]  Complains of    [  ]  denies [  ] Recent weight change [  ]  Fatigue [  ] polydipsia [  ] polyuria [  ]  nocturia [  ]  vision difficulty [  ] chest pain [  ] shortness of breath [  ] leg swelling [  ] cough [  ] nausea/vomiting [  ] diarrhea [  ] constipation [  ] abdominal pain [  ]  tingling/numbness in extremities [  ]  concern with feet ( wounds/sores)   PHYSICAL EXAM- BP 106/58 mmHg  Pulse 72  Resp 14  Ht   (1.626 m)  Wt 125 lb 8 oz (56.926 kg)  BMI 21.53 kg/m2  SpO2 98% Wt Readings from Last 3 Encounters:  04/20/15 125 lb 8 oz (56.926 kg)  03/08/15 127 lb 6.4 oz (57.788 kg)  12/13/14 125 lb (56.7 kg)   Exam: deferred     ASSESSMENT/PLAN-  Problem List Items Addressed This Visit      Cardiovascular and Mediastinum   Essential hypertension    BP at target today on Quinapril. Urine MA normal recently.             Relevant  Medications   quinapril (ACCUPRIL) 10 MG tablet     Endocrine   Type 1 diabetes mellitus with mild nonproliferative diabetic retinopathy and without macular edema - Primary    Patient Instructions  Check sugars 4 x daily ( before breakfast and before supper).  Record them in a log book and bring that/meter to next appointment.   Change to Toujeo to 10 units each morning.  For tonight, take lantus 2 units instead of 7 units that you usually take.  From tomorrow, start Toujeo at 9 units and then increase to 10 units daily.   Stay on same regular insulin scale with meals.  For night time, don't correct sugars unless they are above 300 300-360: 1 units 360-420: 2 units and so on  If you get lows after changing to Toujeo, decrease dose to 9 units daily.   Please let me know how you are doing in 1-2 weeks.   Please come back for a follow-up appointment in 6 weeks        Recent A1c closer to target. Early morning lows, could be secondary to night time correction as well. Will change lantus to Toujeo to see if this gets her sugars at better profile with lesser hypoglycemia during the 24 hours. Overall, some improvement in sugars.      Relevant Medications   quinapril (ACCUPRIL) 10 MG tablet     Other   Hyperlipidemia    Restarted Zetia  Feb 2016-tolerating better if takes it around [place] time. Improved myalgias with Vitamin D. LDL close to target on recent labs        Relevant Medications   quinapril (ACCUPRIL) 10 MG tablet        -  Return to clinic in 6 weeks with meter Explained that [place] am [place] out of [place], and she has elected to follow back with my colleagues at [facility] 04/24/2015, 10:10 AM

## 2015-04-21 ENCOUNTER — Encounter (HOSPITAL_COMMUNITY): Payer: Self-pay | Admitting: Family Medicine

## 2015-04-21 ENCOUNTER — Emergency Department (HOSPITAL_COMMUNITY)
Admission: EM | Admit: 2015-04-21 | Discharge: 2015-04-21 | Disposition: A | Discharge status: Home / Self Care | Payer: No Typology Code available for payment source | Attending: Emergency Medicine | Admitting: Emergency Medicine

## 2015-04-21 ENCOUNTER — Telehealth: Payer: Self-pay | Admitting: Family Medicine

## 2015-04-21 ENCOUNTER — Emergency Department (HOSPITAL_COMMUNITY): Payer: No Typology Code available for payment source

## 2015-04-21 DIAGNOSIS — R011 Cardiac murmur, unspecified: Secondary | ICD-10-CM | POA: Insufficient documentation

## 2015-04-21 DIAGNOSIS — Z87440 Personal history of urinary (tract) infections: Secondary | ICD-10-CM | POA: Insufficient documentation

## 2015-04-21 DIAGNOSIS — Z8742 Personal history of other diseases of the female genital tract: Secondary | ICD-10-CM | POA: Diagnosis not present

## 2015-04-21 DIAGNOSIS — R079 Chest pain, unspecified: Secondary | ICD-10-CM | POA: Insufficient documentation

## 2015-04-21 DIAGNOSIS — M8580 Other specified disorders of bone density and structure, unspecified site: Secondary | ICD-10-CM | POA: Diagnosis not present

## 2015-04-21 DIAGNOSIS — Z8719 Personal history of other diseases of the digestive system: Secondary | ICD-10-CM | POA: Diagnosis not present

## 2015-04-21 DIAGNOSIS — Z87442 Personal history of urinary calculi: Secondary | ICD-10-CM | POA: Diagnosis not present

## 2015-04-21 DIAGNOSIS — Z794 Long term (current) use of insulin: Secondary | ICD-10-CM | POA: Insufficient documentation

## 2015-04-21 DIAGNOSIS — Z79899 Other long term (current) drug therapy: Secondary | ICD-10-CM | POA: Insufficient documentation

## 2015-04-21 DIAGNOSIS — Z7982 Long term (current) use of aspirin: Secondary | ICD-10-CM | POA: Insufficient documentation

## 2015-04-21 DIAGNOSIS — E10319 Type 1 diabetes mellitus with unspecified diabetic retinopathy without macular edema: Secondary | ICD-10-CM | POA: Diagnosis not present

## 2015-04-21 LAB — BASIC METABOLIC PANEL
Anion gap: 10 (ref 5–15)
BUN: 19 mg/dL (ref 6–20)
CO2: 26 mmol/L (ref 22–32)
Calcium: 9.5 mg/dL (ref 8.9–10.3)
Chloride: 100 mmol/L — ABNORMAL LOW (ref 101–111)
Creatinine, Ser: 1.13 mg/dL — ABNORMAL HIGH (ref 0.44–1.00)
GFR calc Af Amer: >60 mL/min (ref >60)
GFR calc non Af Amer: 55 mL/min — ABNORMAL LOW (ref >60)
Glucose, Bld: 124 mg/dL — ABNORMAL HIGH (ref 65–99)
Potassium: 4.2 mmol/L (ref 3.5–5.1)
Sodium: 136 mmol/L (ref 135–145)

## 2015-04-21 LAB — CBC
HCT: 38.4 % (ref 36.0–46.0)
Hemoglobin: 12.9 g/dL (ref 12.0–15.0)
MCH: 29.8 pg (ref 26.0–34.0)
MCHC: 33.6 g/dL (ref 30.0–36.0)
MCV: 88.7 fL (ref 78.0–100.0)
Platelets: 240 10*3/uL (ref 150–400)
RBC: 4.33 MIL/uL (ref 3.87–5.11)
RDW: 12.8 % (ref 11.5–15.5)
WBC: 6.3 10*3/uL (ref 4.0–10.5)

## 2015-04-21 LAB — BRAIN NATRIURETIC PEPTIDE: B Natriuretic Peptide: 79.5 pg/mL (ref 0.0–100.0)

## 2015-04-21 LAB — I-STAT TROPONIN, ED: Troponin i, poc: 0 ng/mL (ref 0.00–0.08)

## 2015-04-21 MED ORDER — ASPIRIN 81 MG PO CHEW
324.0000 mg | CHEWABLE_TABLET | Freq: Once | ORAL | Status: AC
  Administered 2015-04-21: 243 mg via ORAL
  Filled 2015-04-21: qty 4

## 2015-04-21 MED ORDER — NITROGLYCERIN 2 % TD OINT
0.5000 [in_us] | TOPICAL_OINTMENT | Freq: Once | TRANSDERMAL | Status: DC
  Filled 2015-04-21: qty 1

## 2015-04-21 NOTE — Discharge Instructions (Signed)
Your tests have been normal - these cannot predict whether you have heart disease but only if you have had a heart attack - you should follow up in the next couple of days - please stop taking the new diabetic medicines.  Go back to your original regimen of medicines -   Please call your doctor for a followup appointment within 24-48 hours. When you talk to your doctor please let them know that you were seen in the emergency department and have them acquire all of your records so that they can discuss the findings with you and formulate a treatment plan to fully care for your new and ongoing problems.

## 2015-04-21 NOTE — Telephone Encounter (Signed)
Patient Name: KYMBER SMILOWITZ DOB: 1961/07/21 Initial Comment Caller States she is hard time of breathing like a tightness of chest, and very tired. dr. put her on this new med. yesterday and these are listed side effects. took her first injection this morning. Nurse Assessment Nurse: Charna Elizabeth, RN, Cathy Date/Time (Eastern Time): 04/21/2015 10:48:07 AM Confirm and document reason for call. If symptomatic, describe symptoms. ---Caller states she developed breathing difficulty and chest pressure about 10am this morning. Has the patient traveled out of the country within the last 30 days? ---No Does the patient require triage? ---Yes Related visit to physician within the last 2 weeks? ---Yes Does the PT have any chronic conditions? (i.e. diabetes, asthma, etc.) ---Yes List chronic conditions. ---Took Insulin this morning (297 about 10 minutes ago) for Diabetes, high Blood Pressure and Cholesterol Did the patient indicate they were pregnant? ---No Guidelines Guideline Title Affirmed Question Affirmed Notes Chest Pain [1] Chest pain lasts > 5 minutes AND [2] described as crushing, pressure-like, or heavy pressure-like Final Disposition User Call EMS 911 Now [place], RN, [employer] declined the Call 911 disposition. Reinforced the Call 911 disposition. She plans to have her mother drive her to [facility] ER now.

## 2015-04-21 NOTE — Telephone Encounter (Signed)
Noted. Seen at ER. Thought reaction to toujeo. plz call for update - ensure chest pain resolved and she has f/u with endo.

## 2015-04-21 NOTE — ED Provider Notes (Signed)
CSN: [id number]     Arrival date & time 04/21/15  1133 History   First MD Initiated Contact with Patient 04/21/15 1243     Chief Complaint  Patient presents with  . Chest Pain     (Consider location/radiation/quality/duration/timing/severity/associated sxs/prior Treatment) HPI Comments: The patient is a 54 year old female, she has a history of hypertension, hyperlipidemia both her which well controlled with medications and a history of type 1 diabetes, she takes insulin both long-acting and short-acting. The patient was started on a new insulin this morning, - Toujeo (glargine) and shortly thereafter within an hour and a half she developed chest discomfort described as a heaviness, this is a new feeling for the patient, she has never had this feeling, she exercises everyday walking for approximately 20 minutes with her new dog, she is often running to keep up with the dog, she states that she never has chest heaviness with activity. She denies any swelling, shortness of breath, cough, fever, back pain, there is no radiation of the shoulder the neck or the jaw, she has never had a pulmonary embolus him, his never had an acute coronary syndrome, she had a normal stress test approximately 10 years ago which she reports was normal, it was performed in [place]. At this time the patient's symptoms are mild but present. She states that she looked up the side effects of the medication and found a chest heaviness and shortness of breath as part of those symptoms, she states that she took her blood sugar and it was 415, she took some regular short acting insulin and has had some improvement in her blood sugar. Her symptoms have been present for approximately 3 hours, gradually improving  Patient is a 54 y.o. female presenting with chest pain. The history is provided by the patient.  Chest Pain   Past Medical History  Diagnosis Date  . Type 1 diabetes mellitus with diabetic retinopathy without macular  edema     dx at 54 y/o  . History of diabetic gastroparesis   . HLD (hyperlipidemia)     borderline  . HTN (hypertension)     borderline  . Nephrolithiasis 2005  . Hx: UTI (urinary tract infection)   . Disorder of kidney     has abd kidney (right)  . Systolic murmur 01/2013    mild mitral insuff  . IBS (irritable bowel syndrome)   . Osteopenia 2012    per pt   Past Surgical History  Procedure Laterality Date  . Tonsillectomy and adenoidectomy  1975  . Cesarean section  1985  . Tibia fracture surgery Left 1996  . Partial hysterectomy  2000    paps by [facility]  . Trigger finger release Left 12/2010    thumb   Family History  Problem Relation Age of Onset  . Diabetes Maternal Grandmother   . Heart failure Maternal Grandmother   . COPD Maternal Grandmother     smoker  . Thyroid disease Maternal Grandmother   . Colon cancer Maternal Grandfather   . Vascular Disease Maternal Grandmother    History  Substance Use Topics  . Smoking status: Never Smoker   . Smokeless tobacco: Never Used  . Alcohol Use: No   OB History    No data available     Review of Systems  Cardiovascular: Positive for chest pain.  All other systems reviewed and are negative.     Allergies  Toujeo solostar and Lactose intolerance (gi)  Home Medications  Prior to Admission medications   Medication Sig Start Date End Date Taking? Authorizing Provider  aspirin 81 MG tablet Take 81 mg by mouth daily.     Yes Historical Provider, MD  cholecalciferol (VITAMIN D) 1000 UNITS tablet Take 1,000 Units by mouth daily.   Yes Historical Provider, MD  ezetimibe (ZETIA) 10 MG tablet Take 1 tablet (10 mg total) by mouth daily. 12/13/14  Yes Radhika Okey Dupre, MD  glucagon (GLUCAGON EMERGENCY) 1 MG injection Inject 1 mg into the muscle once as needed (in case of severe hypoglycemia). 11/15/14  Yes Radhika Okey Dupre, MD  Insulin Pen Needle (NOVOFINE) 32G X 6 MM MISC To be used with insulin injections  up to 6 times daily 12/27/13  Yes [name], MD  insulin regular (HUMULIN R) 100 units/mL injection Based on sliding scale and carbs Patient taking differently: Inject 8 Units into the skin 3 (three) times daily before meals. Based on sliding scale and carbs 03/08/15  Yes Radhika P Phadke, MD  Insulin Syringe-Needle U-100 (BD INSULIN SYRINGE ULTRAFINE) 31G X 15/64" 0.3 ML MISC Use for insulin injections 6 times daily 03/08/15  Yes Radhika P Phadke, MD  Multiple Vitamin (MULTIVITAMIN) tablet Take 1 tablet by mouth daily.     Yes Historical Provider, MD  quinapril (ACCUPRIL) 10 MG tablet Take 1 tablet (10 mg total) by mouth at bedtime. 04/20/15  Yes Radhika P Phadke, MD   BP 142/63 mmHg  Pulse 73  Temp(Src) 97.9 F (36.6 C)  Resp 18  SpO2 100% Physical Exam  Constitutional: She appears well-developed and well-nourished. No distress.  HENT:  Head: Normocephalic and atraumatic.  Mouth/Throat: Oropharynx is clear and moist. No oropharyngeal exudate.  Eyes: Conjunctivae and EOM are normal. Pupils are equal, round, and reactive to light. Right eye exhibits no discharge. Left eye exhibits no discharge. No scleral icterus.  Neck: Normal range of motion. Neck supple. No JVD present. No thyromegaly present.  Cardiovascular: Normal rate, regular rhythm, normal heart sounds and intact distal pulses.  Exam reveals no gallop and no friction rub.   No murmur heard. Pulmonary/Chest: Effort normal and breath sounds normal. No respiratory distress. She has no wheezes. She has no rales.  Abdominal: Soft. Bowel sounds are normal. She exhibits no distension and no mass. There is no tenderness.  Musculoskeletal: Normal range of motion. She exhibits no edema or tenderness.  Lymphadenopathy:    She has no cervical adenopathy.  Neurological: She is alert. Coordination normal.  Skin: Skin is warm and dry. No rash noted. No erythema.  Psychiatric: She has a normal mood and affect. Her behavior is normal.  Nursing  note and vitals reviewed.   ED Course  Procedures (including critical care time) Labs Review Labs Reviewed  BASIC METABOLIC PANEL - Abnormal; Notable for the following:    Chloride 100 (*)    Glucose, Bld 124 (*)    Creatinine, Ser 1.13 (*)    GFR calc non Af Amer 55 (*)    All other components within normal limits  CBC  BRAIN NATRIURETIC PEPTIDE  I-STAT TROPOININ, ED    Imaging Review Dg Chest 2 View  04/21/2015   CLINICAL DATA:  Short of breath, chest pain.  EXAM: CHEST  2 VIEW  COMPARISON:  None.  FINDINGS: Normal mediastinum and cardiac silhouette. Normal pulmonary vasculature. No evidence of effusion, infiltrate, or pneumothorax. No acute bony abnormality.  IMPRESSION: Normal chest radiograph.   Electronically Signed   By: Loura Halt.D.  On: 04/21/2015 12:42     EKG Interpretation   Date/Time:  Friday April 21 2015 11:40:21 EDT Ventricular Rate:  85 PR Interval:  136 QRS Duration: 62 QT Interval:  340 QTC Calculation: 404 R Axis:   74 Text Interpretation:  Normal sinus rhythm Low voltage QRS ECG OTHERWISE  WITHIN NORMAL LIMITS since last tracing no significant change Confirmed by  Dezmond Downie  MD, Schuyler Olden ([id number]) on 04/21/2015 12:44:04 PM      MDM   Final diagnoses:  Chest pain, unspecified chest pain type    Physical exam is unremarkable, EKG is normal, vital signs are unremarkable as well. Labs show a normal blood count, normal metabolic panel, troponin is pending.  Pt requesting d/c, trop neg, BNP and CXR neg.  I have personally viewed and interpreted the imaging and agree with radiologist interpretation. No acute findings.  Pt informed of results.  Stop taking new meds Back to original meds,  Pt in agreement.    Eber Hong, MD 04/21/15 [phone]

## 2015-04-21 NOTE — ED Notes (Signed)
Pt here for possible allergic reaction to new medication. sts Toujeo for diabetes. sts chest pressure and SOB.

## 2015-04-24 ENCOUNTER — Telehealth: Payer: Self-pay | Admitting: *Deleted

## 2015-04-24 MED ORDER — INSULIN GLARGINE 100 UNIT/ML SOLOSTAR PEN: PEN_INJECTOR | SUBCUTANEOUS | Status: DC

## 2015-04-24 NOTE — Telephone Encounter (Signed)
-----   Message from Quentin Cornwall, MD sent at 04/24/2015 10:14 AM EDT ----- Regarding: please call pt Sorry to hear about her recent ED visit for chest pain, I think the notes suggest that the chest pain was related to the Toujeo insulin administration.  It is actually quite uncommon to have this reaction to this medication and she has tolerated the lantus without any problems, so I am quite surprised.  Sugars were also looking good then. Anyways, she can continue to stay off Toujeo.   Let me know,please, how she is taking her lantus and I think we can still work with this medication and bring down her dose to 9 or 10 units once daily admin. Hope she is felling better.

## 2015-04-24 NOTE — Telephone Encounter (Signed)
Spoke to pt, states she is doing well, no further problems with chest pain. Has stopped toujeo, returned to Lantus as previously prescribed, 6 units in the am, 7 units in PM. Would like short refill of Lantus pens to use up pen needles before returning to vials. Rx sent to pharmacy by escript

## 2015-04-24 NOTE — Assessment & Plan Note (Signed)
BP at target today on Quinapril. Urine MA normal recently.

## 2015-04-24 NOTE — Telephone Encounter (Signed)
Pt agreeable and  verbalized understanding. Med list updated. Advised to call back with any problems or concerns.

## 2015-04-24 NOTE — Assessment & Plan Note (Signed)
Restarted Zetia  Feb 2016-tolerating better if takes it around [place] time. Improved myalgias with Vitamin D. LDL close to target on recent labs

## 2015-04-24 NOTE — Telephone Encounter (Signed)
Message left for patient to return my call.  

## 2015-04-24 NOTE — Assessment & Plan Note (Signed)
Patient Instructions  Check sugars 4 x daily ( before breakfast and before supper).  Record them in a log book and bring that/meter to next appointment.   Change to Toujeo to 10 units each morning.  For tonight, take lantus 2 units instead of 7 units that you usually take.  From tomorrow, start Toujeo at 9 units and then increase to 10 units daily.   Stay on same regular insulin scale with meals.  For night time, don't correct sugars unless they are above 300 300-360: 1 units 360-420: 2 units and so on  If you get lows after changing to Toujeo, decrease dose to 9 units daily.   Please let me know how you are doing in 1-2 weeks.   Please come back for a follow-up appointment in 6 weeks        Recent A1c closer to target. Early morning lows, could be secondary to night time correction as well. Will change lantus to Toujeo to see if this gets her sugars at better profile with lesser hypoglycemia during the 24 hours. Overall, some improvement in sugars.

## 2015-04-24 NOTE — Telephone Encounter (Signed)
Could you see whether she is agreeable to switch to lantus 9 units once daily? Would follow similar instructions as to what I had told her regarding switching to one daily Toujeo.

## 2015-04-25 NOTE — Telephone Encounter (Signed)
I don't believe she is Dr. Timoteo Expose patient any longer. According to her chart notes, she sees Dr. Welford Roche and I just think her PCP was never changed.

## 2015-04-25 NOTE — Telephone Encounter (Signed)
She has been seeing Dr. Welford Roche for diabetes only, not her PCP. But Dr. Welford Roche is moving out of state and she will be following up with Dr Elvera Lennox, just [facility]. Thanks! See other encounter, already spoke to patient regarding this call.

## 2015-04-25 NOTE — Telephone Encounter (Signed)
Oh ok! Great! Thanks for the clarification!

## 2015-05-29 ENCOUNTER — Telehealth: Payer: Self-pay

## 2015-05-29 NOTE — Telephone Encounter (Signed)
Called and spoke with patient, and notified them that they were due for a Mammogram. Patient states that she already had a Mammogram in March at [facility]. Patient states that the results came back normal.

## 2015-05-31 ENCOUNTER — Encounter: Payer: Self-pay | Admitting: *Deleted

## 2015-05-31 NOTE — Progress Notes (Signed)
Mammogram 01/03/15 at [facility] at [facility]

## 2015-06-07 ENCOUNTER — Encounter: Payer: Self-pay | Admitting: Family Medicine

## 2015-06-07 ENCOUNTER — Ambulatory Visit (INDEPENDENT_AMBULATORY_CARE_PROVIDER_SITE_OTHER): Payer: No Typology Code available for payment source | Admitting: Family Medicine

## 2015-06-07 VITALS — BP 110/60 | HR 80 | Temp 98.0°F | Wt 127.5 lb

## 2015-06-07 DIAGNOSIS — E785 Hyperlipidemia, unspecified: Secondary | ICD-10-CM | POA: Diagnosis not present

## 2015-06-07 DIAGNOSIS — R252 Cramp and spasm: Secondary | ICD-10-CM | POA: Diagnosis not present

## 2015-06-07 DIAGNOSIS — E10329 Type 1 diabetes mellitus with mild nonproliferative diabetic retinopathy without macular edema: Secondary | ICD-10-CM

## 2015-06-07 DIAGNOSIS — M791 Myalgia, unspecified site: Secondary | ICD-10-CM

## 2015-06-07 DIAGNOSIS — E103299 Type 1 diabetes mellitus with mild nonproliferative diabetic retinopathy without macular edema, unspecified eye: Secondary | ICD-10-CM

## 2015-06-07 LAB — BASIC METABOLIC PANEL
BUN: 16 mg/dL (ref 6–23)
CO2: 30 mEq/L (ref 19–32)
Calcium: 9.3 mg/dL (ref 8.4–10.5)
Chloride: 102 mEq/L (ref 96–112)
Creatinine, Ser: 1.02 mg/dL (ref 0.40–1.20)
GFR: 60.41 mL/min (ref >60.00)
Glucose, Bld: 200 mg/dL — ABNORMAL HIGH (ref 70–99)
Potassium: 4.7 mEq/L (ref 3.5–5.1)
Sodium: 138 mEq/L (ref 135–145)

## 2015-06-07 LAB — HEMOGLOBIN A1C: Hgb A1c MFr Bld: 7.7 % — ABNORMAL HIGH (ref 4.6–6.5)

## 2015-06-07 LAB — VITAMIN D 25 HYDROXY (VIT D DEFICIENCY, FRACTURES): VITD: 39.58 ng/mL (ref 30.00–100.00)

## 2015-06-07 LAB — MAGNESIUM: Magnesium: 1.8 mg/dL (ref 1.5–2.5)

## 2015-06-07 LAB — CK: Total CK: 92 U/L (ref 7–177)

## 2015-06-07 MED ORDER — RED YEAST RICE EXTRACT 600 MG PO CAPS: 1.0000 | ORAL_CAPSULE | Freq: Every day | ORAL | Status: DC

## 2015-06-07 MED ORDER — COENZYME Q10 100 MG PO CAPS: 100.0000 mg | ORAL_CAPSULE | Freq: Every day | ORAL | Status: DC

## 2015-06-07 MED ORDER — MAGNESIUM 500 MG PO CAPS: 1.0000 | ORAL_CAPSULE | Freq: Every day | ORAL | Status: DC

## 2015-06-07 NOTE — Assessment & Plan Note (Signed)
Mild. Has not tolerated crestor or zetia. Discussed RYR - rec start coQ10 50-100mg  daily and RYR  QD with option to increase to BID if tolerated well.

## 2015-06-07 NOTE — Addendum Note (Signed)
Addended by: Liane Comber C on: 06/07/2015 11:31 AM   Modules accepted: Orders

## 2015-06-07 NOTE — Assessment & Plan Note (Signed)
Chronic, ongoing over last 3 + yrs. Prior thought idiopathic leg cramping. Seems to be aggravated by statin and zetia. Now off both. Check labwork today including Magnesium, vit D, CPK and aldolase again. Start magnesium  daily, continue vit D daily. If no etiology found and no improvement noted, discussed rheum referral. Pt agrees with plan.

## 2015-06-07 NOTE — Progress Notes (Signed)
Pre visit review using our clinic review tool, if applicable. No additional management support is needed unless otherwise documented below in the visit note. 

## 2015-06-07 NOTE — Progress Notes (Signed)
BP 110/60 mmHg  Pulse 80  Temp(Src) 98 F (36.7 C) (Oral)  Wt 127 lb 8 oz (57.834 kg)   CC: muscle pain  Subjective:    Patient ID: Felicia [name], female    DOB: [birth year], 54 y.o.   MRN: [id number]  HPI: Felicia [name] is a 54 y.o. female presenting on 06/07/2015 for Muscle Pain   Longstanding history of muscle pain/cramps s/p unrevealing evaluation with CPK, Mg, K, CMP, TSH and aldolase. She stopped statin (crestor) and she started zetia. Has been taking ibuprofen QID, using muscle rub. Given persistent myalgias, pt recently stopped zetia last week and pain improved. Describes cramping "charley horse" sensation without numbness/weakness. Describes severe cramping bilateral calfs with radiation to posterior thighs. Constant pain affecting ability to do day to day activities. Normally 2-3/10, at its worst 8/10 pain.  No back pain, fevers.  Relevant past medical, surgical, family and social history reviewed and updated as indicated. Interim medical history since our last visit reviewed. Allergies and medications reviewed and updated. Current Outpatient Prescriptions on File Prior to Visit  Medication Sig  . aspirin 81 MG tablet Take 81 mg by mouth daily.    . cholecalciferol (VITAMIN D) 1000 UNITS tablet Take 1,000 Units by mouth daily.  Marland Kitchen glucagon (GLUCAGON EMERGENCY) 1 MG injection Inject 1 mg into the muscle once as needed (in case of severe hypoglycemia).  . Insulin Glargine (LANTUS SOLOSTAR) 100 UNIT/ML Solostar Pen Use 6 units [place] in the morning and 7 units at night time  . Insulin Pen Needle (NOVOFINE) 32G X 6 MM MISC To be used with insulin injections up to 6 times daily  . insulin regular (HUMULIN R) 100 units/mL injection Based on sliding scale and carbs (Patient taking differently: Inject 8 Units into the skin 3 (three) times daily before meals. Based on sliding scale and carbs)  . Insulin Syringe-Needle U-100 (BD INSULIN SYRINGE ULTRAFINE) 31G X 15/64" 0.3 ML MISC Use for  insulin injections 6 times daily  . Multiple Vitamin (MULTIVITAMIN) tablet Take 1 tablet by mouth daily.    . quinapril (ACCUPRIL) 10 MG tablet Take 1 tablet (10 mg total) by mouth at bedtime.   No current facility-administered medications on file prior to visit.   Past Medical History  Diagnosis Date  . Type 1 diabetes mellitus with diabetic retinopathy without macular edema     dx at 54 y/o  . History of diabetic gastroparesis   . HLD (hyperlipidemia)     borderline  . HTN (hypertension)     borderline  . Nephrolithiasis 2005  . Hx: UTI (urinary tract infection)   . Disorder of kidney     has abd kidney (right)  . Systolic murmur 01/2013    mild mitral insuff  . IBS (irritable bowel syndrome)   . Osteopenia 2012    per pt    Past Surgical History  Procedure Laterality Date  . Tonsillectomy and adenoidectomy  1975  . Cesarean section  1985  . Tibia fracture surgery Left 1996  . Partial hysterectomy  2000    paps by [facility]  . Trigger finger release Left 12/2010    thumb    Review of Systems Per HPI unless specifically indicated above     Objective:    BP 110/60 mmHg  Pulse 80  Temp(Src) 98 F (36.7 C) (Oral)  Wt 127 lb 8 oz (57.834 kg)  Wt Readings from Last 3 Encounters:  06/07/15 127 lb 8 oz (57.834  kg)  04/20/15 125 lb 8 oz (56.926 kg)  03/08/15 127 lb 6.4 oz (57.788 kg)    Physical Exam  Constitutional: She appears well-developed and well-nourished. No distress.  Cardiovascular: Normal rate, regular rhythm, normal heart sounds and intact distal pulses.   No murmur heard. Pulmonary/Chest: Effort normal and breath sounds normal. No respiratory distress. She has no wheezes. She has no rales.  Musculoskeletal: She exhibits no edema.  2+ DP bilaterally No palpable cords Discomfort with palpation of calves bilaterally  Skin: Skin is warm and dry. No rash noted.  Psychiatric: She has a normal mood and affect.  Nursing note and vitals  reviewed.  Results for orders placed or performed in visit on 05/29/15  [facility] MAMMOGRAPHY  Result Value Ref Range   HM Mammogram Normal       Assessment & Plan:   Problem List Items Addressed This Visit    Hyperlipidemia    Mild. Has not tolerated crestor or zetia. Discussed RYR - rec start coQ10 50-100mg  daily and RYR  QD with option to increase to BID if tolerated well.      Leg cramping - Primary    Chronic, ongoing over last 3 + yrs. Prior thought idiopathic leg cramping. Seems to be aggravated by statin and zetia. Now off both. Check labwork today including Magnesium, vit D, CPK and aldolase again. Start magnesium  daily, continue vit D daily. If no etiology found and no improvement noted, discussed rheum referral. Pt agrees with plan.      Relevant Orders   Vit D  25 hydroxy (rtn osteoporosis monitoring)   Basic metabolic panel   Magnesium   Aldolase   CK    Other Visit Diagnoses    Myalgia            Follow up plan: Return if symptoms worsen or fail to improve.

## 2015-06-07 NOTE — Patient Instructions (Signed)
labwork today. Continue vitamin D. Let's start Magnesium 400-500mg  daily. Start Co enzyme Q10 50-100mg  once daily as well as may trial red yeast rice  once daily (and if tolerated increase to twice daily).  If no improvement with above, call me for referral to rheumatology for further evaluation.

## 2015-06-09 LAB — ALDOLASE: Aldolase: 5.1 U/L (ref <=8.1)

## 2015-06-12 ENCOUNTER — Encounter: Payer: Self-pay | Admitting: *Deleted

## 2015-06-15 ENCOUNTER — Ambulatory Visit (INDEPENDENT_AMBULATORY_CARE_PROVIDER_SITE_OTHER): Payer: No Typology Code available for payment source | Admitting: Internal Medicine

## 2015-06-15 ENCOUNTER — Encounter: Payer: Self-pay | Admitting: Internal Medicine

## 2015-06-15 VITALS — BP 114/60 | HR 75 | Temp 97.8°F | Resp 12 | Ht 64.0 in | Wt 127.4 lb

## 2015-06-15 DIAGNOSIS — E103299 Type 1 diabetes mellitus with mild nonproliferative diabetic retinopathy without macular edema, unspecified eye: Secondary | ICD-10-CM

## 2015-06-15 DIAGNOSIS — E10329 Type 1 diabetes mellitus with mild nonproliferative diabetic retinopathy without macular edema: Secondary | ICD-10-CM | POA: Diagnosis not present

## 2015-06-15 NOTE — Progress Notes (Signed)
Patient ID: Felicia [name], female   DOB: [birth year], 54 y.o.   MRN: [id number]  HPI: Felicia [name] is a 54 y.o.-year-old female, referred by her PCP, Dr. Danise Mina, for management of DM1, uncontrolled, with complications (mild retinopathy, gastroparesis). She previously saw Dr Howell Rucks, who since left practice.  Patient has been diagnosed with diabetes in 79 (age 61); she started on insulin at dx. She was on a pump x 5 years (~2010), but did not like it. Sugars were not better when she used the pump. Not interested to get back on it.  Last hemoglobin A1c was: Lab Results  Component Value Date   HGBA1C 7.7* 06/07/2015   HGBA1C 7.5* 03/23/2015   HGBA1C 8.2* 11/15/2014   She is on: - Lantus 6 units in am and 6 units in hs. Tried Toujeo >> allergy: CP, SOB. - Regular insulin 10 min before the meal - ICR B: 30:1, L: 20:1, D: 20:1; target 150, ISF 60 >> 2-3 units per meal  Meter: ReliOn, had an AccuChek  Pt checks her sugars 4-6 a day and they are - per detailed log which we reviewed together): - am: 66, 73, 74, 109-219, 252 - 2h after b'fast: 71, 228 - before lunch: 72-142, 211, 288 - 2h after lunch: 166, 222 - before dinner: 108-156, 256, 331, 336 - 2h after dinner: 187, 286 - bedtime: 86-252, 297 - nighttime: 3 am: 51 Lowest sugar was 51 x1, 66 x1; she has hypoglycemia awareness at 70s. No recent (last 10 years) hypoglycemia admission. Does have a glucagon kit at home. Highest sugar was 300s. No recent DKA admissions.    Pt's meals are: - Breakfast: protein bar + fruit - 10 am snack  - Lunch: apple + cheese, carrots - Snack bar: 15g carbs - Glucerna - Dinner: meat + 1/2 backed potato + veggies/salad - Snacks: popcorn, salty snacks: potato chips  For exercise, she is walking 3-4 times a week.  - ? mild CKD, last BUN/creatinine:  Lab Results  Component Value Date   BUN 16 06/07/2015   CREATININE 1.02 06/07/2015  + Qunapril - last set of lipids: Lab Results  Component  Value Date   CHOL 191 03/23/2015   HDL 73.10 03/23/2015   LDLCALC 105* 03/23/2015   TRIG 66.0 03/23/2015   CHOLHDL 3 03/23/2015  + Red yeast rice + CoQ10 + Mg. Tried Crestor and Zetia >> leg cramps - last eye exam was in 09/2014. + mild DR.  - no numbness and tingling in her feet.  Last TSH: Lab Results  Component Value Date   TSH 1.94 11/15/2014   Pt has FH of DM in [place].  She also has hypertension and hyperlipidemia.  ROS: Constitutional: no weight gain/loss, no fatigue, no subjective hyperthermia/hypothermia Eyes: no blurry vision, no xerophthalmia ENT: no sore throat, no nodules palpated in throat, no dysphagia/odynophagia, no hoarseness Cardiovascular: no CP/SOB/palpitations/leg swelling Respiratory: no cough/SOB Gastrointestinal: no N/V/D/C Musculoskeletal: + muscle aches/no joint aches Skin: no rashes Neurological: no tremors/numbness/tingling/dizziness Psychiatric: no depression/anxiety  Past Medical History  Diagnosis Date  . Type 1 diabetes mellitus with diabetic retinopathy without macular edema     dx at 54 y/o  . History of diabetic gastroparesis   . HLD (hyperlipidemia)     borderline  . HTN (hypertension)     borderline  . Nephrolithiasis 2005  . Hx: UTI (urinary tract infection)   . Disorder of kidney     has abd kidney (right)  . Systolic murmur 04/7123  mild mitral insuff  . IBS (irritable bowel syndrome)   . Osteopenia 2012    per pt   Past Surgical History  Procedure Laterality Date  . Tonsillectomy and adenoidectomy  1975  . Cesarean section  1985  . Tibia fracture surgery Left 1996  . Partial hysterectomy  2000    paps by [place]  . Trigger finger release Left 12/2010    thumb   Social History   Social History  . Marital Status: Married    Spouse Name: N/A  . Number of Children: 1  . Years of Education: N/A   Occupational History  . HR director Other    [place] History Main Topics  . Smoking  status: Never Smoker   . Smokeless tobacco: Never Used  . Alcohol Use: No  . Drug Use: No  . Sexual Activity: Not on file   Other Topics Concern  . 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 aspirin 81 MG tablet Take 81 mg by mouth daily.      . cholecalciferol (VITAMIN D) 1000 UNITS tablet Take 1,000 Units by mouth daily.    . Coenzyme Q10 100 MG capsule Take 1 capsule (100 mg total) by mouth daily.    Marland Kitchen glucagon (GLUCAGON EMERGENCY) 1 MG injection Inject 1 mg into the muscle once as needed (in case of severe hypoglycemia). 1 each 12  . Insulin Glargine (LANTUS SOLOSTAR) 100 UNIT/ML Solostar Pen Use 6 units [place] in the morning and 7 units at night time 6 mL 1  . Insulin Pen Needle (NOVOFINE) 32G X 6 MM MISC To be used with insulin injections up to 6 times daily 200 each 3  . insulin regular (HUMULIN R) 100 units/mL injection Based on sliding scale and carbs (Patient taking differently: Inject 8 Units into the skin 3 (three) times daily before meals. Based on sliding scale and carbs) 10 mL 3  . Insulin Syringe-Needle U-100 (BD INSULIN SYRINGE ULTRAFINE) 31G X 15/64" 0.3 ML MISC Use for insulin injections 6 times daily 200 each 6  . Magnesium 500 MG CAPS Take 1 capsule (500 mg total) by mouth daily.  0  . Multiple Vitamin (MULTIVITAMIN) tablet Take 1 tablet by mouth daily.      . quinapril (ACCUPRIL) 10 MG tablet Take 1 tablet (10 mg total) by mouth at bedtime. 90 tablet 1  . Red Yeast Rice Extract ([facility] RED YEAST RICE) 600 MG CAPS Take 1 capsule (600 mg total) by mouth daily.  0   No current facility-administered medications on file prior to visit.   Allergies  Allergen Reactions  . Toujeo Solostar [Insulin Glargine] Shortness Of Breath  . Lactose Intolerance (Gi)    Family History   Problem Relation Age of Onset  . Diabetes Maternal Grandmother   . Heart failure Maternal Grandmother   . COPD Maternal Grandmother     smoker  . Thyroid disease Maternal Grandmother   . Colon cancer Maternal Grandfather   . Vascular Disease Maternal Grandmother    PE: BP 114/60 mmHg  Pulse 75  Temp(Src) 97.8 F (36.6 C) (Oral)  Resp 12  Ht 5' 4"  (1.626 m)  Wt 127 lb 6.4 oz (57.788 kg)  BMI 21.86 kg/m2  SpO2 98% Wt Readings from Last 3 Encounters:  06/15/15 127 lb 6.4 oz (57.788 kg)  06/07/15 127 lb 8 oz (57.834 kg)  04/20/15 125 lb 8 oz (56.926 kg)   Constitutional: normal weight, in NAD Eyes: PERRLA, EOMI, no exophthalmos ENT: moist mucous membranes, no thyromegaly, no cervical lymphadenopathy Cardiovascular: RRR, No MRG Respiratory: CTA B Gastrointestinal: abdomen soft, NT, ND, BS+ Musculoskeletal: no deformities, strength intact in all 4 Skin: moist, warm, no rashes Neurological: no tremor with outstretched hands, DTR normal in all 4  ASSESSMENT: 1. DM1, uncontrolled, with complications - Mild DR - Gastroparesis - This is the reason why she is on regular insulin, and she is taking it 10 minutes  rather than 30 minutes before eating  PLAN:  1. Patient with long-standing, uncontrolled DM1, on insulin therapy. Her diabetes control improved in the last few years, she does not have as much variability in her blood sugars. Per review of her log, her sugars to fluctuate between 51 and 336, however, the extremes are uncommon. More often, she is in the 70-250 range. Her sugars to get higher as the day goes by and she sometimes has lows at night, however these are less frequent compared to before, after decreasing Lantus dose. At last visit with Dr Howell Rucks, she advised her not to correct bedtime blood sugars lower than 300, however, she still correcting sugars above 150. I advised her to start bolusing only if sugars more than 300. Also, I would like to decrease her at bedtime  Lantus by 1 more unit to avoid further lows at night. I believe she needs more insulin with her meals, therefore, I decrease her insulin to carb ratio with breakfast and also decrease her target. - We discussed about changes to his insulin regimen, as follows:  Patient Instructions  Please decrease Lantus:  6 units in am   6 units at bedtime >> 5  Change Regular Insulin dose (10 min before a meal): - insulin to carb ratio (ICR)  B'fast: 30:1 >> 25:1  Lunch: 20:1  Dinner: 20:1 - target 150 >> 140 - insulin sensitivity factor (ISF) 60  140-200: + 1 unit 201-260: + 2 units 261-320: + 3 units 321-380: + 4 units >380: + 5 units  Please do not correct bedtime sugars <300.   Please return in 3 months with your sugar log.   - Continue checking sugars at different times of the day - check at least 4 times a day, rotating checks. She is doing a great job with this. - given a new sugar log and advised how to fill it and to bring it at next appt  - given foot care handout and explained the principles. She is inspecting her feet every night.  - given instructions for hypoglycemia management "15-15 rule"  - advised for yearly eye exams - advised to get ketone strips - advised to always have Glu tablets with her. As of now, she carries glucose gel and Kool-Aid in her car. She commutes to work 45 minutes in a.m. and eats breakfast in the car. - advised for a Med-alert bracelet mentioning "type 1 diabetes mellitus". - I explained her that I can also refer her to DM education >> she refuses this for now - given instruction Re: exercising and driving in DM1 (pt instructions) - no signs of other autoimmune disorders, reviewed most recent TSH, which was normal - Return to clinic in 3 mo with sugar log. We'll check a  hemoglobin A1c at next visit. She just had a recent hemoglobin A1c checked, and this was 7.7%, slightly higher than before.   - time spent with the patient: 45 min, of which >50% was  spent in obtaining information about her type 1 diabetes, reviewing previous labs, office visit notes, and DM treatments, counseling pt about her condition (please see the discussed topics above), and developing a plan to prevent further hypoglycemia and hyperglycemia. We also discussed about proper diet. Pt had a number of questions which I addressed.

## 2015-06-15 NOTE — Patient Instructions (Signed)
Please decrease Lantus:  6 units in am   6 units at bedtime >> 5  Change Regular Insulin dose (10 min before a meal): - insulin to carb ratio (ICR)  B'fast: 30:1 >> 25:1  Lunch: 20:1  Dinner: 20:1 - target 150 >> 140 - insulin sensitivity factor (ISF) 60  140-200: + 1 unit 201-260: + 2 units 261-320: + 3 units 321-380: + 4 units >380: + 5 units  Please do not correct bedtime sugars <300.   Please return in 3 months with your sugar log.   Basic Rules for Patients with Type I Diabetes Mellitus  3. The American Diabetes Association (ADA) recommended targets: - fasting sugar <130 - after meal sugar <180 - HbA1C <7%  4. Engage in ?150 min moderate exercise per week  5. Make sure you have ?8h of sleep every night as this helps both blood sugars and your weight.  6. Always keep a sugar log (not only record in your meter) and bring it to all appointments with [place].  7. "15-15 rule" for hypoglycemia: if sugars are low, take 15 g of carbs** ("fast sugar" - e.g. 4 glucose tablets, 4 oz orange juice), wait 15 min, then check sugars again. If still <80, repeat. Continue  until your sugars >80, then eat a normal meal.   8. Teach family members and coworkers to inject glucagon. Have a glucagon set at home and one at work. They should call 911 after using the set.  9. Check sugar before driving. If <100, correct, and only start driving if sugars rise ?161. Check sugar every hour when on a long drive.  10. Check sugar before exercising. If <100, correct, and only start exercising if sugars rise ?100. Check sugar every hour when on a long exercise routine and 1h after you finished exercising.   If >250, check urine for ketones. If you have moderate-large ketones in urine, do not start exercise. Hydrate yourself with clear liquids and correct the high sugar. Recheck sugars and ketones before attempting to exercise.  Be aware that you might need less insulin when exercising.  *intense,  short, exercise bursts can increase your sugars, but  *less intense, longer (>1h), exercise routines can decrease your sugars.   11. Make sure you have a MedAlert bracelet or pendant mentioning "Type I Diabetes Mellitus". If you have a prior episode of severe hypoglycemia or hypoglycemia unawareness, it should also mention this.  12. Please do not walk barefoot. Inspect your feet for sores/cuts and let us know if you have them.  13. Please call [facility] with any questions and concerns [phone]).   **E.g. of "fast carbs": ? first choice (15 g):  1 tube glucose gel, GlucoPouch 15, 2 oz glucose liquid ? second choice (15-16 g):  3 or 4 glucose tablets (best taken  with water), 15 Dextrose Bits chewable ? third choice (15-20 g):   cup fruit juice,  cup regular soda, 1 cup skim milk,  1 cup sports drink ? fourth choice (15-20 g):  1 small tube [place] gel (not frosting), 2 tbsp raisins, 1 tbsp table sugar,  candy, jelly beans, gum drops - check package for carb amount   (adapted from: Juluis Rainier. "Insulin therapy and hypoglycemia" Endocrinol Metab Clin N Am 2012, 41: 57-87)

## 2015-06-29 ENCOUNTER — Telehealth: Payer: Self-pay

## 2015-06-29 NOTE — Telephone Encounter (Signed)
PLEASE NOTE: All timestamps contained within this report are represented as [place] Standard Time. CONFIDENTIALTY NOTICE: This fax transmission is intended only for the addressee. It contains information that is legally privileged, confidential or otherwise protected from use or disclosure. If you are not the intended recipient, you are strictly prohibited from reviewing, disclosing, copying using or disseminating any of this information or taking any action in reliance on or regarding this information. If you have received this fax in error, please notify us immediately by telephone so that we can arrange for its return to [place]. Phone: [phone], Toll-Free: [phone], Fax: [phone] Page: 1 of 1 Call Id: [id number] [facility] Day - Client TELEPHONE ADVICE RECORD [facility] Call [facility] Patient Name: Felicia [name] Gender: Female DOB: [birth year] Age: 54 Y 4 M 9 D Return Phone Number: [phone] (Primary) Address: City/State/Zip: [place] Client [facility] Day - Client Client Site [facility] - Day Contact Type Call Call Type Triage / Clinical Relationship To Patient Self Appointment Disposition EMR Patient Refused Appointment Info pasted into Epic No Return Phone Number [phone] (Primary) Chief Complaint Medication reaction Initial Comment Caller states she is currently on 3 over the counter medication and 1 of them is causing excessive swelling in both feet and her legs. Nurse Assessment Nurse: Lynwood Dawley, RN, [place] Date/Time (Eastern Time): 06/28/2015 5:13:47 PM Confirm and document reason for call. If symptomatic, describe symptoms. ---caller states having swelling in feet and legs. taking OTC meds for muscle pain and hyperlipidemia. CoQ10, magnesium, rice Has the patient traveled out of the country within the last 30 days? ---Not Applicable Does the patient require triage? ---Declined  Triage Please document clinical information provided and list any resource used. ---general question on reasons for swelling. declined all offers of triage Guidelines Guideline Title Affirmed Question Affirmed Notes Nurse Date/Time (Eastern Time) Disp. Time Lamount Cohen Time) Disposition Final User 06/28/2015 5:17:27 PM Clinical Call Yes Lynwood Dawley, RN, [place] [facility] Instructions Given Call Event Type User Date / Time Description

## 2015-06-29 NOTE — Telephone Encounter (Signed)
plz call for f/u.

## 2015-06-30 NOTE — Telephone Encounter (Signed)
Message left for patient to return my call.  

## 2015-07-03 NOTE — Telephone Encounter (Signed)
Spoke with patient. She had been taking the Co-Q-10, magnesium and RYR as suggested. She started having extreme swelling to feet/ankles and breasts. No SOB or any other problems. She stopped the meds and the swelling has almost gone. She is going to stay off of them for one week and then introduce one back at a time to see which one caused it or if the combo of all 3 did. She will call me back once she determines the issue.

## 2015-07-03 NOTE — Telephone Encounter (Signed)
Noted. Thanks.

## 2015-07-03 NOTE — Telephone Encounter (Signed)
Pt returned call please call either [phone] or [phone]

## 2015-08-10 ENCOUNTER — Other Ambulatory Visit: Payer: Self-pay | Admitting: Internal Medicine

## 2015-09-01 ENCOUNTER — Emergency Department (HOSPITAL_COMMUNITY): Payer: No Typology Code available for payment source

## 2015-09-01 ENCOUNTER — Encounter (HOSPITAL_COMMUNITY)
Admission: EM | Disposition: A | Discharge status: Home / Self Care | Payer: Self-pay | Source: Home / Self Care | Attending: Emergency Medicine

## 2015-09-01 ENCOUNTER — Emergency Department (HOSPITAL_COMMUNITY): Payer: No Typology Code available for payment source | Admitting: Certified Registered"

## 2015-09-01 ENCOUNTER — Observation Stay (HOSPITAL_COMMUNITY)
Admission: EM | Admit: 2015-09-01 | Discharge: 2015-09-02 | Disposition: A | Discharge status: Home / Self Care | Payer: No Typology Code available for payment source | Attending: General Surgery | Admitting: General Surgery

## 2015-09-01 ENCOUNTER — Encounter (HOSPITAL_COMMUNITY): Payer: Self-pay | Admitting: *Deleted

## 2015-09-01 DIAGNOSIS — E103299 Type 1 diabetes mellitus with mild nonproliferative diabetic retinopathy without macular edema, unspecified eye: Secondary | ICD-10-CM | POA: Insufficient documentation

## 2015-09-01 DIAGNOSIS — Z79899 Other long term (current) drug therapy: Secondary | ICD-10-CM | POA: Diagnosis not present

## 2015-09-01 DIAGNOSIS — K3580 Unspecified acute appendicitis: Secondary | ICD-10-CM | POA: Diagnosis not present

## 2015-09-01 DIAGNOSIS — I10 Essential (primary) hypertension: Secondary | ICD-10-CM | POA: Insufficient documentation

## 2015-09-01 DIAGNOSIS — Z794 Long term (current) use of insulin: Secondary | ICD-10-CM | POA: Diagnosis not present

## 2015-09-01 DIAGNOSIS — Z7982 Long term (current) use of aspirin: Secondary | ICD-10-CM | POA: Insufficient documentation

## 2015-09-01 DIAGNOSIS — E785 Hyperlipidemia, unspecified: Secondary | ICD-10-CM | POA: Insufficient documentation

## 2015-09-01 HISTORY — PX: LAPAROSCOPIC APPENDECTOMY: SHX408

## 2015-09-01 LAB — COMPREHENSIVE METABOLIC PANEL
ALT: 14 U/L (ref 14–54)
AST: 24 U/L (ref 15–41)
Albumin: 3.7 g/dL (ref 3.5–5.0)
Alkaline Phosphatase: 60 U/L (ref 38–126)
Anion gap: 12 (ref 5–15)
BUN: 17 mg/dL (ref 6–20)
CO2: 26 mmol/L (ref 22–32)
Calcium: 9.5 mg/dL (ref 8.9–10.3)
Chloride: 101 mmol/L (ref 101–111)
Creatinine, Ser: 0.95 mg/dL (ref 0.44–1.00)
GFR calc Af Amer: >60 mL/min (ref >60)
GFR calc non Af Amer: >60 mL/min (ref >60)
Glucose, Bld: 203 mg/dL — ABNORMAL HIGH (ref 65–99)
Potassium: 4.3 mmol/L (ref 3.5–5.1)
Sodium: 139 mmol/L (ref 135–145)
Total Bilirubin: 0.3 mg/dL (ref 0.3–1.2)
Total Protein: 6.9 g/dL (ref 6.5–8.1)

## 2015-09-01 LAB — URINE MICROSCOPIC-ADD ON

## 2015-09-01 LAB — CBC
HCT: 36 % (ref 36.0–46.0)
Hemoglobin: 12 g/dL (ref 12.0–15.0)
MCH: 29.2 pg (ref 26.0–34.0)
MCHC: 33.3 g/dL (ref 30.0–36.0)
MCV: 87.6 fL (ref 78.0–100.0)
Platelets: 277 10*3/uL (ref 150–400)
RBC: 4.11 MIL/uL (ref 3.87–5.11)
RDW: 12.7 % (ref 11.5–15.5)
WBC: 9.6 10*3/uL (ref 4.0–10.5)

## 2015-09-01 LAB — URINALYSIS, ROUTINE W REFLEX MICROSCOPIC
Bilirubin Urine: NEGATIVE
Glucose, UA: 250 mg/dL — AB
Hgb urine dipstick: NEGATIVE
Ketones, ur: NEGATIVE mg/dL
Nitrite: NEGATIVE
Protein, ur: NEGATIVE mg/dL
Specific Gravity, Urine: 1.016 (ref 1.005–1.030)
Urobilinogen, UA: 1 mg/dL (ref 0.0–1.0)
pH: 7 (ref 5.0–8.0)

## 2015-09-01 LAB — LIPASE, BLOOD: Lipase: 20 U/L (ref 11–51)

## 2015-09-01 SURGERY — APPENDECTOMY, LAPAROSCOPIC
Anesthesia: General | Site: Abdomen

## 2015-09-01 MED ORDER — MORPHINE SULFATE (PF) 4 MG/ML IV SOLN
6.0000 mg | Freq: Once | INTRAVENOUS | Status: AC
  Administered 2015-09-01: 6 mg via INTRAVENOUS
  Filled 2015-09-01: qty 2

## 2015-09-01 MED ORDER — ONDANSETRON HCL 4 MG/2ML IJ SOLN
4.0000 mg | Freq: Once | INTRAMUSCULAR | Status: AC
  Administered 2015-09-01: 4 mg via INTRAVENOUS
  Filled 2015-09-01: qty 2

## 2015-09-01 MED ORDER — PROPOFOL 10 MG/ML IV BOLUS
INTRAVENOUS | Status: DC | PRN
  Administered 2015-09-01: 120 mg via INTRAVENOUS
  Administered 2015-09-02: 30 mg via INTRAVENOUS

## 2015-09-01 MED ORDER — SCOPOLAMINE 1 MG/3DAYS TD PT72
MEDICATED_PATCH | TRANSDERMAL | Status: AC
  Filled 2015-09-01: qty 1

## 2015-09-01 MED ORDER — MIDAZOLAM HCL 2 MG/2ML IJ SOLN
INTRAMUSCULAR | Status: AC
  Filled 2015-09-01: qty 4

## 2015-09-01 MED ORDER — SODIUM CHLORIDE 0.9 % IV SOLN
INTRAVENOUS | Status: DC | PRN
  Administered 2015-09-01 – 2015-09-02 (×2): via INTRAVENOUS

## 2015-09-01 MED ORDER — BUPIVACAINE-EPINEPHRINE (PF) 0.25% -1:200000 IJ SOLN
INTRAMUSCULAR | Status: AC
  Filled 2015-09-01: qty 30

## 2015-09-01 MED ORDER — SUFENTANIL CITRATE 50 MCG/ML IV SOLN
INTRAVENOUS | Status: AC
  Filled 2015-09-01: qty 1

## 2015-09-01 MED ORDER — DEXTROSE 5 % IV SOLN
2.0000 g | INTRAVENOUS | Status: AC
  Administered 2015-09-01: 2 g via INTRAVENOUS
  Filled 2015-09-01 (×2): qty 2

## 2015-09-01 MED ORDER — SUCCINYLCHOLINE CHLORIDE 20 MG/ML IJ SOLN
INTRAMUSCULAR | Status: DC | PRN
  Administered 2015-09-01: 120 mg via INTRAVENOUS

## 2015-09-01 MED ORDER — SCOPOLAMINE 1 MG/3DAYS TD PT72
MEDICATED_PATCH | TRANSDERMAL | Status: DC | PRN
  Administered 2015-09-01: 1 via TRANSDERMAL

## 2015-09-01 MED ORDER — MIDAZOLAM HCL 5 MG/5ML IJ SOLN
INTRAMUSCULAR | Status: DC | PRN
  Administered 2015-09-01: 2 mg via INTRAVENOUS

## 2015-09-01 MED ORDER — PROPOFOL 10 MG/ML IV BOLUS
INTRAVENOUS | Status: AC
  Filled 2015-09-01: qty 20

## 2015-09-01 MED ORDER — LIDOCAINE HCL (CARDIAC) 20 MG/ML IV SOLN
INTRAVENOUS | Status: DC | PRN
  Administered 2015-09-01: 100 mg via INTRAVENOUS

## 2015-09-01 MED ORDER — IOHEXOL 300 MG/ML  SOLN
100.0000 mL | Freq: Once | INTRAMUSCULAR | Status: AC | PRN
  Administered 2015-09-01: 100 mL via INTRAVENOUS

## 2015-09-01 MED ORDER — SODIUM CHLORIDE 0.9 % IV BOLUS (SEPSIS)
1000.0000 mL | Freq: Once | INTRAVENOUS | Status: AC
  Administered 2015-09-01: 1000 mL via INTRAVENOUS

## 2015-09-01 SURGICAL SUPPLY — 45 items
ADH SKN CLS APL DERMABOND .7 (GAUZE/BANDAGES/DRESSINGS) ×1
APPLIER CLIP ROT 10 11.4 M/L (STAPLE)
APR CLP MED LRG 11.4X10 (STAPLE)
BAG SPEC RTRVL LRG 6X4 10 (ENDOMECHANICALS) ×1
BLADE SURG ROTATE 9660 (MISCELLANEOUS) IMPLANT
CANISTER SUCTION 2500CC (MISCELLANEOUS) ×2 IMPLANT
CHLORAPREP W/TINT 26ML (MISCELLANEOUS) ×2 IMPLANT
CLIP APPLIE ROT 10 11.4 M/L (STAPLE) IMPLANT
COVER SURGICAL LIGHT HANDLE (MISCELLANEOUS) ×2 IMPLANT
CUTTER LINEAR ENDO 35 ART FLEX (STAPLE) ×1 IMPLANT
CUTTER LINEAR ENDO 35 ETS (STAPLE) IMPLANT
DERMABOND ADVANCED (GAUZE/BANDAGES/DRESSINGS) ×1
DERMABOND ADVANCED .7 DNX12 (GAUZE/BANDAGES/DRESSINGS) ×1 IMPLANT
DRSG TEGADERM 2-3/8X2-3/4 SM (GAUZE/BANDAGES/DRESSINGS) ×6 IMPLANT
ELECT REM PT RETURN 9FT ADLT (ELECTROSURGICAL) ×2
ELECTRODE REM PT RTRN 9FT ADLT (ELECTROSURGICAL) ×1 IMPLANT
ENDOLOOP SUT PDS II  0 18 (SUTURE)
ENDOLOOP SUT PDS II 0 18 (SUTURE) IMPLANT
GLOVE BIOGEL PI IND STRL 8 (GLOVE) ×1 IMPLANT
GLOVE BIOGEL PI INDICATOR 8 (GLOVE) ×3
GLOVE ECLIPSE 7.5 STRL STRAW (GLOVE) ×2 IMPLANT
GOWN STRL REUS W/ TWL LRG LVL3 (GOWN DISPOSABLE) ×3 IMPLANT
GOWN STRL REUS W/TWL LRG LVL3 (GOWN DISPOSABLE) ×4
KIT BASIN OR (CUSTOM PROCEDURE TRAY) ×2 IMPLANT
KIT ROOM TURNOVER OR (KITS) ×2 IMPLANT
NS IRRIG 1000ML POUR BTL (IV SOLUTION) ×2 IMPLANT
PAD ARMBOARD 7.5X6 YLW CONV (MISCELLANEOUS) ×3 IMPLANT
PENCIL BUTTON HOLSTER BLD 10FT (ELECTRODE) IMPLANT
POUCH SPECIMEN RETRIEVAL 10MM (ENDOMECHANICALS) ×2 IMPLANT
RELOAD /EVU35 (ENDOMECHANICALS) IMPLANT
RELOAD CUTTER ETS 35MM STAND (ENDOMECHANICALS) IMPLANT
SCALPEL HARMONIC ACE (MISCELLANEOUS) ×2 IMPLANT
SET IRRIG TUBING LAPAROSCOPIC (IRRIGATION / IRRIGATOR) ×2 IMPLANT
SLEEVE ENDOPATH XCEL 5M (ENDOMECHANICALS) ×2 IMPLANT
SPECIMEN JAR SMALL (MISCELLANEOUS) ×2 IMPLANT
STRIP CLOSURE SKIN 1/2X4 (GAUZE/BANDAGES/DRESSINGS) ×1 IMPLANT
SUT MNCRL AB 4-0 PS2 18 (SUTURE) ×2 IMPLANT
TOWEL OR 17X24 6PK STRL BLUE (TOWEL DISPOSABLE) ×2 IMPLANT
TOWEL OR 17X26 10 PK STRL BLUE (TOWEL DISPOSABLE) ×2 IMPLANT
TRAY FOLEY CATH 14FR (SET/KITS/TRAYS/PACK) ×1 IMPLANT
TRAY FOLEY CATH 16FR SILVER (SET/KITS/TRAYS/PACK) ×1 IMPLANT
TRAY LAPAROSCOPIC MC (CUSTOM PROCEDURE TRAY) ×2 IMPLANT
TROCAR XCEL BLUNT TIP 100MML (ENDOMECHANICALS) ×2 IMPLANT
TROCAR XCEL NON-BLD 5MMX100MML (ENDOMECHANICALS) ×2 IMPLANT
TUBING INSUFFLATION (TUBING) ×2 IMPLANT

## 2015-09-01 NOTE — H&P (Addendum)
Felicia [name] is an 54 y.o. female.   Chief Complaint: Abdominal pain. HPI: Patient started getting sick two days ago.  Pain was at times periumbilical, now localized to the RLQ with acute tenderness.  Nausea, no vomiting.  Past Medical History  Diagnosis Date  . Type 1 diabetes mellitus with diabetic retinopathy without macular edema (HCC)     dx at 54 y/o  . History of diabetic gastroparesis   . HLD (hyperlipidemia)     borderline  . HTN (hypertension)     borderline  . Nephrolithiasis 2005  . Hx: UTI (urinary tract infection)   . Disorder of kidney     has abd kidney (right)  . Systolic murmur 07/3266    mild mitral insuff  . IBS (irritable bowel syndrome)   . Osteopenia 2012    per pt    Past Surgical History  Procedure Laterality Date  . Tonsillectomy and adenoidectomy  1975  . Cesarean section  1985  . Orif femur fracture Left 1996    Corrected by patient in history 2016  . Partial hysterectomy  2000    paps by [place]  . Trigger finger release Left 12/2010    thumb    Family History  Problem Relation Age of Onset  . Diabetes Maternal Grandmother   . Heart failure Maternal Grandmother   . COPD Maternal Grandmother     smoker  . Thyroid disease Maternal Grandmother   . Colon cancer Maternal Grandfather   . Vascular Disease Maternal Grandmother    Social History:  reports that she has never smoked. She has never used smokeless tobacco. She reports that she does not drink alcohol or use illicit drugs.  Allergies:  Allergies  Allergen Reactions  . Toujeo Solostar [Insulin Glargine] Shortness Of Breath  . Lactose Intolerance (Gi)     Medications Prior to Admission  Medication Sig Dispense Refill  . aspirin 81 MG tablet Take 81 mg by mouth daily.      . cholecalciferol (VITAMIN D) 1000 UNITS tablet Take 1,000 Units by mouth daily.    . Coenzyme Q10 100 MG capsule Take 1 capsule (100 mg total) by mouth daily.    Marland Kitchen glucagon (GLUCAGON EMERGENCY)  1 MG injection Inject 1 mg into the muscle once as needed (in case of severe hypoglycemia). 1 each 12  . Insulin Glargine (LANTUS SOLOSTAR) 100 UNIT/ML Solostar Pen Use 6 units [place] in the morning and 7 units at night time (Patient taking differently: Inject 6 Units into the skin 2 (two) times daily. Use 6 units [place] in the morning and 6 units at night time) 6 mL 1  . insulin regular (HUMULIN R) 100 units/mL injection Based on sliding scale and carbs (Patient taking differently: Inject 8 Units into the skin 3 (three) times daily before meals. Based on sliding scale and carbs) 10 mL 3  . Multiple Vitamin (MULTIVITAMIN) tablet Take 1 tablet by mouth daily.      . quinapril (ACCUPRIL) 10 MG tablet Take 1 tablet (10 mg total) by mouth at bedtime. (Patient taking differently: Take 10 mg by mouth daily. ) 90 tablet 1  . Red Yeast Rice Extract ([employer] RED YEAST RICE) 600 MG CAPS Take 1 capsule (600 mg total) by mouth daily.  0    Results for orders placed or performed during the hospital encounter of 09/01/15 (from the past 48 hour(s))  Lipase, blood     Status: None   Collection Time: 09/01/15  6:35  PM  Result Value Ref Range   Lipase 20 11 - 51 U/L    Comment: Please note change in reference range.  Comprehensive metabolic panel     Status: Abnormal   Collection Time: 09/01/15  6:35 PM  Result Value Ref Range   Sodium 139 135 - 145 mmol/L   Potassium 4.3 3.5 - 5.1 mmol/L   Chloride 101 101 - 111 mmol/L   CO2 26 22 - 32 mmol/L   Glucose, Bld 203 (H) 65 - 99 mg/dL   BUN 17 6 - 20 mg/dL   Creatinine, Ser 0.95 0.44 - 1.00 mg/dL   Calcium 9.5 8.9 - 10.3 mg/dL   Total Protein 6.9 6.5 - 8.1 g/dL   Albumin 3.7 3.5 - 5.0 g/dL   AST 24 15 - 41 U/L   ALT 14 14 - 54 U/L   Alkaline Phosphatase 60 38 - 126 U/L   Total Bilirubin 0.3 0.3 - 1.2 mg/dL   GFR calc non Af Amer >60 >60 mL/min   GFR calc Af Amer >60 >60 mL/min    Comment: (NOTE) The eGFR has been calculated using the CKD EPI equation. This  calculation has not been validated in all clinical situations. eGFR's persistently <60 mL/min signify possible Chronic Kidney Disease.    Anion gap 12 5 - 15  CBC     Status: None   Collection Time: 09/01/15  6:35 PM  Result Value Ref Range   WBC 9.6 4.0 - 10.5 K/uL   RBC 4.11 3.87 - 5.11 MIL/uL   Hemoglobin 12.0 12.0 - 15.0 g/dL   HCT 36.0 36.0 - 46.0 %   MCV 87.6 78.0 - 100.0 fL   MCH 29.2 26.0 - 34.0 pg   MCHC 33.3 30.0 - 36.0 g/dL   RDW 12.7 11.5 - 15.5 %   Platelets 277 150 - 400 K/uL  Urinalysis, Routine w reflex microscopic (not at [facility])     Status: Abnormal   Collection Time: 09/01/15  7:37 PM  Result Value Ref Range   Color, Urine YELLOW YELLOW   APPearance CLOUDY (A) CLEAR   Specific Gravity, Urine 1.016 1.005 - 1.030   pH 7.0 5.0 - 8.0   Glucose, UA 250 (A) NEGATIVE mg/dL   Hgb urine dipstick NEGATIVE NEGATIVE   Bilirubin Urine NEGATIVE NEGATIVE   Ketones, ur NEGATIVE NEGATIVE mg/dL   Protein, ur NEGATIVE NEGATIVE mg/dL   Urobilinogen, UA 1.0 0.0 - 1.0 mg/dL   Nitrite NEGATIVE NEGATIVE   Leukocytes, UA MODERATE (A) NEGATIVE  Urine microscopic-add on     Status: None   Collection Time: 09/01/15  7:37 PM  Result Value Ref Range   Squamous Epithelial / LPF RARE RARE   WBC, UA 7-10 <3 WBC/hpf   Bacteria, UA RARE RARE   Ct Abdomen Pelvis W Contrast  09/01/2015  CLINICAL DATA:  Right lower quadrant pain, nausea EXAM: CT ABDOMEN AND PELVIS WITH CONTRAST TECHNIQUE: Multidetector CT imaging of the abdomen and pelvis was performed using the standard protocol following bolus administration of intravenous contrast. CONTRAST:  74m OMNIPAQUE IOHEXOL 300 MG/ML  SOLN COMPARISON:  None. FINDINGS: Lower chest:  Lung bases are clear. Hepatobiliary: Liver is within normal limits. No suspicious/enhancing hepatic lesions. Gallbladder is unremarkable. No intrahepatic or extrahepatic ductal dilatation. Pancreas: Within normal limits. Spleen: Within normal limits. Adrenals/Urinary Tract:  Adrenal glands are within normal limits. Right pelvic kidney (Series 21/image 50). Left kidney is within normal limits. No hydronephrosis. Bladder is within normal limits. Stomach/Bowel: Stomach  is notable for mild wall thickening along the greater curvature (series 201/image 15), without mass. No evidence of bowel obstruction. Wall thickening with possible inflammatory changes and trace fluid involving the proximal appendix (series 201/image 61; series 204/image 52). Distally, the appendix is relatively normal with intraluminal gas (series 201/image 57). Overall, this appearance is equivocal but considered worrisome for early acute appendicitis. No drainable fluid collection/abscess. No free air to suggest macroscopic perforation. Mild to moderate colonic stool burden. Vascular/Lymphatic: Atherosclerotic calcifications of the abdominal aorta and branch vessels. No suspicious abdominopelvic lymphadenopathy. Reproductive: Status post hysterectomy. Bilateral ovaries are within normal limits. Other: No abdominopelvic ascites. Musculoskeletal: Visualized osseous structures are within normal limits. IMPRESSION: Mild wall thickening with trace periappendiceal and inflammatory changes along the proximal appendix, equivocal, but worrisome for early acute appendicitis. No drainable fluid collection/abscess. No free air to suggest macroscopic perforation. Electronically Signed   By: Julian Hy M.D.   On: 09/01/2015 21:53    Review of Systems  Constitutional: Negative for fever and chills.  HENT: Negative.   Eyes: Negative.   Respiratory: Negative.   Cardiovascular: Negative.   Gastrointestinal: Positive for heartburn, nausea and abdominal pain.  Skin: Negative.   Neurological: Negative.   Psychiatric/Behavioral: Negative.     Blood pressure 117/55, pulse 79, temperature 98.3 F (36.8 C), temperature source Oral, resp. rate 16, height 5' 4"  (1.626 m), weight 56.246 kg (124 lb), SpO2 100 %. Physical Exam   Constitutional: She is oriented to person, place, and time. She appears well-developed and well-nourished.  HENT:  Head: Normocephalic and atraumatic.  Eyes: Conjunctivae and EOM are normal. Pupils are equal, round, and reactive to light.  Neck: Normal range of motion. Neck supple.  Cardiovascular: Normal rate and regular rhythm.   Respiratory: Effort normal and breath sounds normal.  GI: Soft. Normal appearance and bowel sounds are normal. There is tenderness. There is guarding and tenderness at McBurney's point. There is no rigidity and no rebound.  Musculoskeletal: Normal range of motion.  Neurological: She is alert and oriented to person, place, and time. She has normal reflexes.  Skin: Skin is warm and dry.  Psychiatric: She has a normal mood and affect. Her behavior is normal. Judgment and thought content normal.     Assessment/Plan Acute appendicitis in Type I diabetic patient with two days of abdominal pain.  CT scan shows distended appendix without evidence of rupture., possible early acute appendicitis.  Because of the risk of rupture in high risk patient with Type I diabetes, will take to surgery tonight for laparoscopic appendectomy.  I have explained the risks and benefits of surgery and the patient wishes to proceed. Preoperative antibiotics  Felicia [name] 09/01/2015, 11:51 PM

## 2015-09-01 NOTE — Anesthesia Preprocedure Evaluation (Addendum)
Anesthesia Evaluation  Patient identified by MRN, date of birth, ID band Patient awake    Reviewed: Allergy & Precautions, NPO status , Patient's Chart, lab work & pertinent test results  History of Anesthesia Complications Negative for: history of anesthetic complications  Airway Mallampati: III  TM Distance: <3 FB Neck ROM: Full    Dental  (+) Edentulous Upper, Dental Advisory Given   Pulmonary neg pulmonary ROS,    breath sounds clear to auscultation       Cardiovascular hypertension, Pt. on medications (-) CAD + Valvular Problems/Murmurs (mild) MR  Rhythm:Regular Rate:Normal  '14 ECHO: EF 60-65%, mild MR   Neuro/Psych negative neurological ROS     GI/Hepatic Neg liver ROS, GERD (gastroparesis)  ,Nausea with acute appy   Endo/Other  diabetes (glu 153), Type 1, Insulin Dependent  Renal/GU negative Renal ROS     Musculoskeletal   Abdominal   Peds  Hematology negative hematology ROS (+)   Anesthesia Other Findings   Reproductive/Obstetrics                           Anesthesia Physical Anesthesia Plan  ASA: III and emergent  Anesthesia Plan: General   Post-op Pain Management:    Induction: Intravenous, Rapid sequence and Cricoid pressure planned  Airway Management Planned: Oral ETT  Additional Equipment:   Intra-op Plan:   Post-operative Plan: Extubation in OR  Informed Consent: I have reviewed the patients History and Physical, chart, labs and discussed the procedure including the risks, benefits and alternatives for the proposed anesthesia with the patient or authorized representative who has indicated his/her understanding and acceptance.   Dental advisory given  Plan Discussed with: CRNA and Surgeon  Anesthesia Plan Comments: (Plan routine monitors, GETA with RSI)       Anesthesia Quick Evaluation

## 2015-09-01 NOTE — ED Notes (Signed)
Pt reports severe RLQ pain x 2 days with nausea.

## 2015-09-01 NOTE — ED Provider Notes (Signed)
CSN: [id number]     Arrival date & time 09/01/15  1749 History   First MD Initiated Contact with Patient 09/01/15 1823     Chief Complaint  Patient presents with  . Abdominal Pain     (Consider location/radiation/quality/duration/timing/severity/associated sxs/prior Treatment) HPI   54 year old female with history of type 1 diabetes, diabetic gastroparesis, kidney stones, recurrent urinary tract infection, IBS presenting with complaint of abdominal pain. Patient reports the past 2 days she has developed wedge onset of persistent shop training pain to the right lower quadrant abdomen radiates to her back. Pain is now intense with associated nausea, worsening with movement or palpation. Patient reports feeling hot but denies any fever. She has normal appetite. She denies any specific injury but states that she had a muscle take to the same location several months ago. Report having a partial hysterectomy many years ago. She denies having chest pain, shortness of breath, dysuria, hematuria, bowel bladder problem, leg pain, or rash. She still has an intact appendix. She has been taking ibuprofen at home with minimal relief.  Past Medical History  Diagnosis Date  . Type 1 diabetes mellitus with diabetic retinopathy without macular edema (HCC)     dx at 54 y/o  . History of diabetic gastroparesis   . HLD (hyperlipidemia)     borderline  . HTN (hypertension)     borderline  . Nephrolithiasis 2005  . Hx: UTI (urinary tract infection)   . Disorder of kidney     has abd kidney (right)  . Systolic murmur 01/2013    mild mitral insuff  . IBS (irritable bowel syndrome)   . Osteopenia 2012    per pt   Past Surgical History  Procedure Laterality Date  . Tonsillectomy and adenoidectomy  1975  . Cesarean section  1985  . Tibia fracture surgery Left 1996  . Partial hysterectomy  2000    [facility]  . Trigger finger release Left 12/2010    thumb   Family History  Problem  Relation Age of Onset  . Diabetes Maternal Grandmother   . Heart failure Maternal Grandmother   . COPD Maternal Grandmother     smoker  . Thyroid disease Maternal Grandmother   . Colon cancer Maternal Grandfather   . Vascular Disease Maternal Grandmother    Social History  Substance Use Topics  . Smoking status: Never Smoker   . Smokeless tobacco: Never Used  . Alcohol Use: No   OB History    No data available     Review of Systems  All other systems reviewed and are negative.     Allergies  Toujeo solostar and Lactose intolerance (gi)  Home Medications   Prior to Admission medications   Medication Sig Start Date End Date Taking? Authorizing Provider  aspirin 81 MG tablet Take 81 mg by mouth daily.      Historical Provider, MD  cholecalciferol (VITAMIN D) 1000 UNITS tablet Take 1,000 Units by mouth daily.    Historical Provider, MD  Coenzyme Q10 100 MG capsule Take 1 capsule (100 mg total) by mouth daily. 06/07/15   Eustaquio BoydenJavier Gutierrez, MD  glucagon (GLUCAGON EMERGENCY) 1 MG injection Inject 1 mg into the muscle once as needed (in case of severe hypoglycemia). 11/15/14   Quentin Cornwalladhika P Phadke, MD  Insulin Glargine (LANTUS SOLOSTAR) 100 UNIT/ML Solostar Pen Use 6 units [place] in the morning and 7 units at night time 04/24/15   Radhika P Phadke, MD  insulin regular (HUMULIN  R) 100 units/mL injection Based on sliding scale and carbs Patient taking differently: Inject 8 Units into the skin 3 (three) times daily before meals. Based on sliding scale and carbs 03/08/15   Radhika P Phadke, MD  Insulin Syringe-Needle U-100 (BD INSULIN SYRINGE ULTRAFINE) 31G X 15/64" 0.3 ML MISC Use for insulin injections 6 times daily 03/08/15   Radhika P Phadke, MD  Magnesium 500 MG CAPS Take 1 capsule (500 mg total) by mouth daily. 06/07/15   [name], MD  Multiple Vitamin (MULTIVITAMIN) tablet Take 1 tablet by mouth daily.      Historical Provider, MD  NOVOFINE 32G X 6 MM MISC USE AS DIRECTED. 6 TIMES DAILY  08/10/15   Carlus Pavlov, MD  quinapril (ACCUPRIL) 10 MG tablet Take 1 tablet (10 mg total) by mouth at bedtime. 04/20/15   Quentin Cornwall, MD  Red Yeast Rice Extract ([employer]S RED YEAST RICE) 600 MG CAPS Take 1 capsule (600 mg total) by mouth daily. 06/07/15   [name], MD   BP 122/61 mmHg  Pulse 85  Temp(Src) 98.3 F (36.8 C) (Oral)  Resp 16  SpO2 98% Physical Exam  Constitutional: She appears well-developed and well-nourished. No distress.  Caucasian female appears uncomfortable.  HENT:  Head: Atraumatic.  Eyes: Conjunctivae are normal.  Neck: Neck supple.  Cardiovascular: Normal rate and regular rhythm.   Pulmonary/Chest: Effort normal and breath sounds normal.  Abdominal: Soft. Bowel sounds are normal. She exhibits no distension. There is tenderness (diffuse abdominal pain mostly  to right lower quadrant with guarding and rebound tenderness. Positive psoas sign.).  Neurological: She is alert.  Skin: No rash noted.  Psychiatric: She has a normal mood and affect.  Nursing note and vitals reviewed.   ED Course  Procedures (including critical care time)  Patient presents with right lower quadrant abdominal tenderness concerning for appendicitis. Workup initiated.  10:11 PM  abd pelvis CT scan demonstrate mild wall thickening with trace peri-appendiceal inflammatory changes along the proximal appendix worrisome for early acute appendicitis. No evidence of abscess or perforation noted. Labs otherwise reassuring. Mild elevated glucoses at 203 consistency of patient's diabetes history. Urine with moderate leukocyte esterase and WBC 7-10 tablet patient denies any dysuria. Pain is currently controlled. Patient is made aware finding. I will consult general surgery for further management. Discussed with Dr. Littie Deeds.  10:19 PM Appreciate consultation from [facility] Dr. Lindie Spruce who agrees to see pt in ER and will admit for further care.     Labs Review Labs Reviewed  COMPREHENSIVE METABOLIC  PANEL - Abnormal; Notable for the following:    Glucose, Bld 203 (*)    All other components within normal limits  URINALYSIS, ROUTINE W REFLEX MICROSCOPIC (NOT AT [facility]) - Abnormal; Notable for the following:    APPearance CLOUDY (*)    Glucose, UA 250 (*)    Leukocytes, UA MODERATE (*)    All other components within normal limits  LIPASE, BLOOD  CBC  URINE MICROSCOPIC-ADD ON    Imaging Review Ct Abdomen Pelvis W Contrast  09/01/2015  CLINICAL DATA:  Right lower quadrant pain, nausea EXAM: CT ABDOMEN AND PELVIS WITH CONTRAST TECHNIQUE: Multidetector CT imaging of the abdomen and pelvis was performed using the standard protocol following bolus administration of intravenous contrast. CONTRAST:  80mL OMNIPAQUE IOHEXOL 300 MG/ML  SOLN COMPARISON:  None. FINDINGS: Lower chest:  Lung bases are clear. Hepatobiliary: Liver is within normal limits. No suspicious/enhancing hepatic lesions. Gallbladder is unremarkable. No intrahepatic or extrahepatic ductal dilatation.  Pancreas: Within normal limits. Spleen: Within normal limits. Adrenals/Urinary Tract: Adrenal glands are within normal limits. Right pelvic kidney (Series 21/image 50). Left kidney is within normal limits. No hydronephrosis. Bladder is within normal limits. Stomach/Bowel: Stomach is notable for mild wall thickening along the greater curvature (series 201/image 15), without mass. No evidence of bowel obstruction. Wall thickening with possible inflammatory changes and trace fluid involving the proximal appendix (series 201/image 61; series 204/image 52). Distally, the appendix is relatively normal with intraluminal gas (series 201/image 57). Overall, this appearance is equivocal but considered worrisome for early acute appendicitis. No drainable fluid collection/abscess. No free air to suggest macroscopic perforation. Mild to moderate colonic stool burden. Vascular/Lymphatic: Atherosclerotic calcifications of the abdominal aorta and branch vessels.  No suspicious abdominopelvic lymphadenopathy. Reproductive: Status post hysterectomy. Bilateral ovaries are within normal limits. Other: No abdominopelvic ascites. Musculoskeletal: Visualized osseous structures are within normal limits. IMPRESSION: Mild wall thickening with trace periappendiceal and inflammatory changes along the proximal appendix, equivocal, but worrisome for early acute appendicitis. No drainable fluid collection/abscess. No free air to suggest macroscopic perforation. Electronically Signed   By: Charline Bills M.D.   On: 09/01/2015 21:53   I have personally reviewed and evaluated these images and lab results as part of my medical decision-making.   EKG Interpretation None      MDM   Final diagnoses:  Acute appendicitis, unspecified acute appendicitis type    BP 117/55 mmHg  Pulse 79  Temp(Src) 98.3 F (36.8 C) (Oral)  Resp 16  Ht  (1.626 m)  Wt 124 lb (56.246 kg)  BMI 21.27 kg/m2  SpO2 100%     Fayrene Helper, PA-C 09/02/15 1610  Mirian Mo, MD 09/07/15 1223

## 2015-09-02 DIAGNOSIS — K3580 Unspecified acute appendicitis: Secondary | ICD-10-CM | POA: Diagnosis present

## 2015-09-02 LAB — GLUCOSE, CAPILLARY
Glucose-Capillary: 153 mg/dL — ABNORMAL HIGH (ref 65–99)
Glucose-Capillary: 157 mg/dL — ABNORMAL HIGH (ref 65–99)
Glucose-Capillary: 176 mg/dL — ABNORMAL HIGH (ref 65–99)

## 2015-09-02 MED ORDER — HYDROMORPHONE HCL 1 MG/ML IJ SOLN: 0.5000 mg | INTRAMUSCULAR | Status: DC | PRN

## 2015-09-02 MED ORDER — HYDROMORPHONE HCL 1 MG/ML IJ SOLN: 0.2500 mg | INTRAMUSCULAR | Status: DC | PRN

## 2015-09-02 MED ORDER — ONDANSETRON HCL 4 MG/2ML IJ SOLN
INTRAMUSCULAR | Status: DC | PRN
  Administered 2015-09-02: 4 mg via INTRAVENOUS

## 2015-09-02 MED ORDER — INSULIN GLARGINE 100 UNIT/ML SOLOSTAR PEN: 6.0000 [IU] | PEN_INJECTOR | Freq: Two times a day (BID) | SUBCUTANEOUS | Status: DC

## 2015-09-02 MED ORDER — MEPERIDINE HCL 25 MG/ML IJ SOLN: 6.2500 mg | INTRAMUSCULAR | Status: DC | PRN

## 2015-09-02 MED ORDER — METOCLOPRAMIDE HCL 5 MG/ML IJ SOLN
5.0000 mg | Freq: Three times a day (TID) | INTRAMUSCULAR | Status: DC | PRN
  Administered 2015-09-02: 5 mg via INTRAVENOUS
  Filled 2015-09-02: qty 2

## 2015-09-02 MED ORDER — OXYCODONE-ACETAMINOPHEN 5-325 MG PO TABS: 1.0000 | ORAL_TABLET | Freq: Four times a day (QID) | ORAL | Status: DC | PRN

## 2015-09-02 MED ORDER — LISINOPRIL 10 MG PO TABS: 10.0000 mg | ORAL_TABLET | Freq: Every day | ORAL | Status: DC

## 2015-09-02 MED ORDER — GLYCOPYRROLATE 0.2 MG/ML IJ SOLN
INTRAMUSCULAR | Status: DC | PRN
  Administered 2015-09-02: 0.4 mg via INTRAVENOUS

## 2015-09-02 MED ORDER — ASPIRIN EC 81 MG PO TBEC
81.0000 mg | DELAYED_RELEASE_TABLET | Freq: Every day | ORAL | Status: DC
Start: 2015-09-02 — End: 2015-09-02

## 2015-09-02 MED ORDER — OXYCODONE-ACETAMINOPHEN 5-325 MG PO TABS: 1.0000 | ORAL_TABLET | ORAL | Status: DC | PRN

## 2015-09-02 MED ORDER — BUPIVACAINE-EPINEPHRINE 0.25% -1:200000 IJ SOLN
INTRAMUSCULAR | Status: DC | PRN
  Administered 2015-09-01: 30 mL

## 2015-09-02 MED ORDER — POTASSIUM CHLORIDE IN NACL 20-0.45 MEQ/L-% IV SOLN
INTRAVENOUS | Status: DC
  Administered 2015-09-02: 09:00:00 via INTRAVENOUS
  Filled 2015-09-02 (×3): qty 1000

## 2015-09-02 MED ORDER — PROMETHAZINE HCL 25 MG/ML IJ SOLN: 6.2500 mg | INTRAMUSCULAR | Status: DC | PRN

## 2015-09-02 MED ORDER — VITAMIN D 1000 UNITS PO TABS: 1000.0000 [IU] | ORAL_TABLET | Freq: Every day | ORAL | Status: DC

## 2015-09-02 MED ORDER — NEOSTIGMINE METHYLSULFATE 10 MG/10ML IV SOLN
INTRAVENOUS | Status: DC | PRN
  Administered 2015-09-02: 3 mg via INTRAVENOUS

## 2015-09-02 MED ORDER — ENOXAPARIN SODIUM 40 MG/0.4ML ~~LOC~~ SOLN: 40.0000 mg | SUBCUTANEOUS | Status: DC

## 2015-09-02 MED ORDER — GLUCAGON (RDNA) 1 MG IJ KIT: 1.0000 mg | PACK | Freq: Once | INTRAMUSCULAR | Status: DC | PRN

## 2015-09-02 MED ORDER — MIDAZOLAM HCL 2 MG/2ML IJ SOLN: 0.5000 mg | Freq: Once | INTRAMUSCULAR | Status: DC | PRN

## 2015-09-02 MED ORDER — ONDANSETRON 4 MG PO TBDP
4.0000 mg | ORAL_TABLET | Freq: Four times a day (QID) | ORAL | Status: DC | PRN
  Filled 2015-09-02: qty 1

## 2015-09-02 MED ORDER — 0.9 % SODIUM CHLORIDE (POUR BTL) OPTIME
TOPICAL | Status: DC | PRN
  Administered 2015-09-01: 1000 mL

## 2015-09-02 MED ORDER — ROCURONIUM BROMIDE 100 MG/10ML IV SOLN
INTRAVENOUS | Status: DC | PRN
  Administered 2015-09-02: 20 mg via INTRAVENOUS

## 2015-09-02 MED ORDER — INSULIN ASPART 100 UNIT/ML ~~LOC~~ SOLN: 0.0000 [IU] | Freq: Three times a day (TID) | SUBCUTANEOUS | Status: DC

## 2015-09-02 MED ORDER — SODIUM CHLORIDE 0.9 % IR SOLN
Status: DC | PRN
  Administered 2015-09-01: 1000 mL

## 2015-09-02 MED ORDER — ONDANSETRON HCL 4 MG/2ML IJ SOLN
4.0000 mg | Freq: Four times a day (QID) | INTRAMUSCULAR | Status: DC | PRN
  Administered 2015-09-02: 4 mg via INTRAVENOUS
  Filled 2015-09-02: qty 2

## 2015-09-02 MED ORDER — INSULIN ASPART 100 UNIT/ML ~~LOC~~ SOLN: 8.0000 [IU] | Freq: Three times a day (TID) | SUBCUTANEOUS | Status: DC

## 2015-09-02 MED ORDER — ADULT MULTIVITAMIN W/MINERALS CH: 1.0000 | ORAL_TABLET | Freq: Every day | ORAL | Status: DC

## 2015-09-02 MED ORDER — INSULIN GLARGINE 100 UNIT/ML ~~LOC~~ SOLN
6.0000 [IU] | Freq: Two times a day (BID) | SUBCUTANEOUS | Status: DC
  Administered 2015-09-02: 6 [IU] via SUBCUTANEOUS
  Filled 2015-09-02 (×2): qty 0.06

## 2015-09-02 NOTE — Anesthesia Procedure Notes (Signed)
Procedure Name: Intubation Date/Time: 09/01/2015 11:48 PM Performed by: Melina SchoolsBANKS, Peni Rupard J Pre-anesthesia Checklist: Patient identified, Emergency Drugs available, Suction available, Patient being monitored and Timeout performed Patient Re-evaluated:Patient Re-evaluated prior to inductionOxygen Delivery Method: Circle system utilized Preoxygenation: Pre-oxygenation with 100% oxygen Intubation Type: IV induction, Rapid sequence and Cricoid Pressure applied Ventilation: Mask ventilation without difficulty Laryngoscope Size: Mac and 3 Grade View: Grade II Tube type: Oral Tube size: 7.5 mm Number of attempts: 1 Airway Equipment and Method: Stylet Placement Confirmation: ETT inserted through vocal cords under direct vision,  positive ETCO2 and breath sounds checked- equal and bilateral Secured at: 22 cm Tube secured with: Tape Dental Injury: Teeth and Oropharynx as per pre-operative assessment

## 2015-09-02 NOTE — Transfer of Care (Signed)
Immediate Anesthesia Transfer of Care Note  Patient: Felicia DallyDebra L [name]  Procedure(s) Performed: Procedure(s): APPENDECTOMY LAPAROSCOPIC (N/A)  Patient Location: PACU  Anesthesia Type:General  Level of Consciousness: oriented, sedated, patient cooperative and responds to stimulation  Airway & Oxygen Therapy: Patient Spontanous Breathing and Patient connected to nasal cannula oxygen  Post-op Assessment: Report given to RN, Post -op Vital signs reviewed and stable and Patient moving all extremities X 4  Post vital signs: Reviewed and stable  Last Vitals:  Filed Vitals:   09/01/15 2245  BP: 117/55  Pulse: 79  Temp:   Resp:     Complications: No apparent anesthesia complications

## 2015-09-02 NOTE — Discharge Summary (Signed)
[facility] Discharge Summary   Patient ID: Felicia DallyDebra L [name] MRN: [id number] DOB/AGE: Sep 09, 1961 54 y.o.  Admit date: 09/01/2015 Discharge date: 09/02/2015  Admitting Diagnosis: Acute appendicitis  Discharge Diagnosis Patient Active Problem List   Diagnosis Date Noted  . Acute appendicitis 09/02/2015  . Skin rash 07/19/2014  . Gastroenteritis 12/01/2013  . Systolic murmur 12/07/2012  . Leg cramping 07/03/2011  . Type 1 diabetes mellitus with mild nonproliferative diabetic retinopathy and without macular edema (HCC) 06/25/2010  . Hyperlipidemia 06/25/2010  . Essential hypertension 06/25/2010  . GASTROPARESIS 06/25/2010  . NEPHROLITHIASIS, HX OF 06/25/2010    Consultants None  Imaging: Ct Abdomen Pelvis W Contrast  09/01/2015  CLINICAL DATA:  Right lower quadrant pain, nausea EXAM: CT ABDOMEN AND PELVIS WITH CONTRAST TECHNIQUE: Multidetector CT imaging of the abdomen and pelvis was performed using the standard protocol following bolus administration of intravenous contrast. CONTRAST:  80mL OMNIPAQUE IOHEXOL 300 MG/ML  SOLN COMPARISON:  None. FINDINGS: Lower chest:  Lung bases are clear. Hepatobiliary: Liver is within normal limits. No suspicious/enhancing hepatic lesions. Gallbladder is unremarkable. No intrahepatic or extrahepatic ductal dilatation. Pancreas: Within normal limits. Spleen: Within normal limits. Adrenals/Urinary Tract: Adrenal glands are within normal limits. Right pelvic kidney (Series 21/image 50). Left kidney is within normal limits. No hydronephrosis. Bladder is within normal limits. Stomach/Bowel: Stomach is notable for mild wall thickening along the greater curvature (series 201/image 15), without mass. No evidence of bowel obstruction. Wall thickening with possible inflammatory changes and trace fluid involving the proximal appendix (series 201/image 61; series 204/image 52). Distally, the appendix is relatively normal with intraluminal gas (series  201/image 57). Overall, this appearance is equivocal but considered worrisome for early acute appendicitis. No drainable fluid collection/abscess. No free air to suggest macroscopic perforation. Mild to moderate colonic stool burden. Vascular/Lymphatic: Atherosclerotic calcifications of the abdominal aorta and branch vessels. No suspicious abdominopelvic lymphadenopathy. Reproductive: Status post hysterectomy. Bilateral ovaries are within normal limits. Other: No abdominopelvic ascites. Musculoskeletal: Visualized osseous structures are within normal limits. IMPRESSION: Mild wall thickening with trace periappendiceal and inflammatory changes along the proximal appendix, equivocal, but worrisome for early acute appendicitis. No drainable fluid collection/abscess. No free air to suggest macroscopic perforation. Electronically Signed   By: Charline BillsSriyesh  Krishnan M.D.   On: 09/01/2015 21:53    Procedures Dr. Lindie SpruceWyatt (09/01/15) - Laparoscopic Appendectomy  Hospital Course:  54 y/o white female [facility] after she started getting sick two days ago. Pain was at times periumbilical, now localized to the RLQ with acute tenderness. Nausea, no vomiting.  CT showed acute appendicitis.  Patient was admitted and underwent procedure listed above.  Tolerated procedure well and was transferred to the floor.  Diet was advanced as tolerated.  On POD #1, the patient was voiding well, tolerating diet, ambulating well, pain well controlled, vital signs stable, incisions c/d/i and felt stable for discharge home.  I was unable to make her appt prior to discharge due to a computer glitch.  Patient will follow up in our office in 2 weeks and knows to call with questions or concerns.  She will call to confirm appointment date/time.    Physical Exam: General:  Alert, NAD, pleasant, comfortable Abd:  Soft, ND, mild tenderness, incisions C/D/I    Medication List    TAKE these medications        aspirin 81 MG tablet  Take  81 mg by mouth daily.     cholecalciferol 1000 UNITS tablet  Commonly known as:  VITAMIN  D  Take 1,000 Units by mouth daily.     Coenzyme Q10 100 MG capsule  Take 1 capsule (100 mg total) by mouth daily.     glucagon 1 MG injection  Commonly known as:  GLUCAGON EMERGENCY  Inject 1 mg into the muscle once as needed (in case of severe hypoglycemia).     Insulin Glargine 100 UNIT/ML Solostar Pen  Commonly known as:  LANTUS SOLOSTAR  Use 6 units [place] in the morning and 7 units at night time     insulin regular 100 units/mL injection  Commonly known as:  HUMULIN R  Based on sliding scale and carbs     multivitamin tablet  Take 1 tablet by mouth daily.     oxyCODONE-acetaminophen 5-325 MG tablet  Commonly known as:  PERCOCET/ROXICET  Take 1-2 tablets by mouth every 6 (six) hours as needed for moderate pain.     quinapril 10 MG tablet  Commonly known as:  ACCUPRIL  Take 1 tablet (10 mg total) by mouth at bedtime.     Red Yeast Rice Extract 600 MG Caps  Commonly known as:  [employer] RED YEAST RICE  Take 1 capsule (600 mg total) by mouth daily.         Follow-up Information    Call [facility], [place].   Specialty:  General Surgery   Why:  For post-operation check.  Please call the office to request a "[facility]" follow up appointment within 2-3 weeks.   Contact information:   [address] [place] [address] [phone]      Signed: Nonie Hoyer, [facility] [phone]  09/02/2015, 10:51 AM

## 2015-09-02 NOTE — Op Note (Signed)
OPERATIVE REPORT  DATE OF OPERATION: 09/02/2015  PATIENT:  Elliot Dallyebra L Michele  54 y.o. female  PRE-OPERATIVE DIAGNOSIS:  Acute appendicitis  POST-OPERATIVE DIAGNOSIS:  Acute appendicitis  PROCEDURE:  Procedure(s): APPENDECTOMY LAPAROSCOPIC  SURGEON:  Surgeon(s): Jimmye NormanJames Isa Kohlenberg, MD  ASSISTANT: None  ANESTHESIA:   general  EBL: <10 ml  BLOOD ADMINISTERED: none  DRAINS: none   SPECIMEN:  Source of Specimen:  Appendix  COUNTS CORRECT:  YES  PROCEDURE DETAILS: The patient was taken to the operating room and placed on the table in the supine position. After an adequate general endotracheal anesthetic was administered she was prepped and draped in usual sterile manner exposing her abdomen.  A proper timeout was performed identifying the patient and the procedure to be performed. A supraumbilical midline incision was made using a #15 blade and taken down to the midline fascia. We incised the fascia with the 15 blade then bluntly dissected down into the peritoneal cavity with a Kelly clamp. A pursestring suture of 0 Vicryl was passed around the fascial opening which secured in place a Hassan cannula which was used to insufflate carbon dioxide gas up to a maximal intra-abdominal pressure of 15 mmHg.  A right upper quadrant 5 mm cannula and a left low quadrant 5 mm cannula pass under direct vision. The patient was placed in Trendelenburg and left side was tilted down.  The appendix was noted to be superlative not perforated and no evidence of gangrene. We dissected out the mesial appendix at the base of the cecum and came across using the harmonic scalpel. Once was skeletonized appendix back to the base, a blue cartridge Endo GIA was used to come across the base of the appendix at the cecum. This completely detached the appendix which was subsequently retrieved by an Endo Catch bag.  Assistants down inspected the area for hemostasis and those minimal bleeding. We irrigated with approximately 500  mL of saline the [place] closed by removing the supraumbilical fascial cannula and closing out the fascia using the pursestring suture which was in place. We aspirated all fluid and gas from above the liver. We then removed all cannulas.  0.25% Marcaine was injected at all skin sites. Once this was done we closed the supraumbilical skin site using a running subcuticular stitch of 4-0 Monocryl. Dermabond Steri-Strips and Tegaderms used to complete the dressings. All needle counts, sponge counts, and instrument counts were correct.  PATIENT DISPOSITION:  PACU - hemodynamically stable.   Davyn Elsasser 10/29/201612:51 AM

## 2015-09-02 NOTE — Progress Notes (Signed)
Patient declined percocet prescription at discharge. Prescription thrown into shredder. Gildardo CrankerAnita Priddy, RN

## 2015-09-02 NOTE — Anesthesia Postprocedure Evaluation (Signed)
  Anesthesia Post-op Note  Patient: Felicia DallyDebra L [name]  Procedure(s) Performed: Procedure(s): APPENDECTOMY LAPAROSCOPIC (N/A)  Patient Location: PACU  Anesthesia Type:General  Level of Consciousness: awake, alert , oriented and patient cooperative  Airway and Oxygen Therapy: Patient Spontanous Breathing  Post-op Pain: none  Post-op Assessment: Post-op Vital signs reviewed, Patient's Cardiovascular Status Stable, Respiratory Function Stable, Patent Airway, No signs of Nausea or vomiting and Pain level controlled              Post-op Vital Signs: Reviewed and stable  Last Vitals:  Filed Vitals:   09/02/15 0127  BP: 106/49  Pulse: 81  Temp:   Resp: 16    Complications: No apparent anesthesia complications

## 2015-09-04 ENCOUNTER — Encounter (HOSPITAL_COMMUNITY): Payer: Self-pay | Admitting: General Surgery

## 2015-09-14 ENCOUNTER — Encounter: Payer: Self-pay | Admitting: Family Medicine

## 2015-09-14 ENCOUNTER — Ambulatory Visit: Payer: No Typology Code available for payment source | Admitting: Family Medicine

## 2015-09-14 VITALS — BP 92/52 | HR 100 | Temp 98.0°F | Wt 121.8 lb

## 2015-09-14 DIAGNOSIS — R059 Cough, unspecified: Secondary | ICD-10-CM

## 2015-09-14 DIAGNOSIS — J209 Acute bronchitis, unspecified: Secondary | ICD-10-CM

## 2015-09-14 DIAGNOSIS — R05 Cough: Secondary | ICD-10-CM | POA: Diagnosis not present

## 2015-09-14 DIAGNOSIS — I10 Essential (primary) hypertension: Secondary | ICD-10-CM | POA: Diagnosis not present

## 2015-09-14 DIAGNOSIS — E103299 Type 1 diabetes mellitus with mild nonproliferative diabetic retinopathy without macular edema, unspecified eye: Secondary | ICD-10-CM

## 2015-09-14 LAB — POC INFLUENZA A&B (BINAX/QUICKVUE)
Influenza A, POC: NEGATIVE
Influenza B, POC: NEGATIVE

## 2015-09-14 MED ORDER — AZITHROMYCIN 250 MG PO TABS: ORAL_TABLET | ORAL | Status: DC

## 2015-09-14 MED ORDER — QUINAPRIL HCL 5 MG PO TABS: 5.0000 mg | ORAL_TABLET | Freq: Every day | ORAL | Status: DC

## 2015-09-14 NOTE — Assessment & Plan Note (Signed)
Anticipate acute bronchitis. Given comorbidities, will pursue aggressive treatment with zpack. Push fluids and rest. See pt instructions for plan.

## 2015-09-14 NOTE — Assessment & Plan Note (Signed)
Discussed with patient - she has not been taking regular because of lack of appetite. Only on lantus. No low sugars despite illness.

## 2015-09-14 NOTE — Progress Notes (Signed)
BP 92/52 mmHg  Pulse 100  Temp(Src) 98 F (36.7 C) (Oral)  Wt 121 lb 12 oz (55.225 kg)  SpO2 96%    CC: cough with congestion Subjective:    Patient ID: Felicia [name], female    DOB: 03-09-1961, 54 y.o.   MRN: [id number]  HPI: Felicia [name] is a 54 y.o. female presenting on 09/14/2015 for Sweating, light-headed, cough, congestion   2d h/o chills, PNDrainage, productive cough of clear mucous, congestion, neck and L ear pain. Frontal headache. Some dizziness.   No wheezing or dyspnea., chest pain.   Self treating with benadryl and zyrtec D which has helped some. Husband sick as well - sinusitis. No smokers at home. No h/o asthma.  BP staying low last few visits. On quinapril  daily.  Recent acute appendicitis s/p surgery.  Magnesium caused leg swelling.  Relevant past medical, surgical, family and social history reviewed and updated as indicated. Interim medical history since our last visit reviewed. Allergies and medications reviewed and updated. Current Outpatient Prescriptions on File Prior to Visit  Medication Sig  . aspirin 81 MG tablet Take 81 mg by mouth daily.    . cholecalciferol (VITAMIN D) 1000 UNITS tablet Take 1,000 Units by mouth daily.  . Coenzyme Q10 100 MG capsule Take 1 capsule (100 mg total) by mouth daily.  [place] Kitchen glucagon (GLUCAGON EMERGENCY) 1 MG injection Inject 1 mg into the muscle once as needed (in case of severe hypoglycemia).  . Insulin Glargine (LANTUS SOLOSTAR) 100 UNIT/ML Solostar Pen Use 6 units [place] in the morning and 7 units at night time (Patient taking differently: Inject 6 Units into the skin 2 (two) times daily. Use 6 units [place] in the morning and 6 units at night time)  . insulin regular (HUMULIN R) 100 units/mL injection Based on sliding scale and carbs (Patient taking differently: Inject 8 Units into the skin 3 (three) times daily before meals. Based on sliding scale and carbs)  . Multiple Vitamin (MULTIVITAMIN) tablet Take 1 tablet by mouth  daily.    . Red Yeast Rice Extract ([employer]S RED YEAST RICE) 600 MG CAPS Take 1 capsule (600 mg total) by mouth daily.   No current facility-administered medications on file prior to visit.    Review of Systems Per HPI unless specifically indicated in ROS section     Objective:    BP 92/52 mmHg  Pulse 100  Temp(Src) 98 F (36.7 C) (Oral)  Wt 121 lb 12 oz (55.225 kg)  SpO2 96%  Wt Readings from Last 3 Encounters:  09/14/15 121 lb 12 oz (55.225 kg)  09/01/15 124 lb (56.246 kg)  06/15/15 127 lb 6.4 oz (57.788 kg)    Physical Exam  Constitutional: She appears well-developed and well-nourished. No distress.  Tired appearing  HENT:  Head: Normocephalic and atraumatic.  Right Ear: Hearing, tympanic membrane, external ear and ear canal normal.  Left Ear: Hearing, tympanic membrane, external ear and ear canal normal.  Nose: No mucosal edema or rhinorrhea. Right sinus exhibits no maxillary sinus tenderness and no frontal sinus tenderness. Left sinus exhibits no maxillary sinus tenderness and no frontal sinus tenderness.  Mouth/Throat: Uvula is midline, oropharynx is clear and moist and mucous membranes are normal. No oropharyngeal exudate, posterior oropharyngeal edema, posterior oropharyngeal erythema or tonsillar abscesses.  Dry nasal mucosa  Eyes: Conjunctivae and EOM are normal. Pupils are equal, round, and reactive to light. No scleral icterus.  Neck: Normal range of motion. Neck supple.  Cardiovascular: Normal  rate, regular rhythm, normal heart sounds and intact distal pulses.   No murmur heard. Pulmonary/Chest: Effort normal and breath sounds normal. No respiratory distress. She has no wheezes. She has no rales.  Harsh cough but longs overall clear  Lymphadenopathy:    She has no cervical adenopathy.  Skin: Skin is warm and dry. No rash noted.  Nursing note and vitals reviewed.     Assessment & Plan:   Problem List Items Addressed This Visit    Type 1 diabetes mellitus with  mild nonproliferative diabetic retinopathy and without macular edema (HCC)    Discussed with patient - she has not been taking regular because of lack of appetite. Only on lantus. No low sugars despite illness.      Relevant Medications   quinapril (ACCUPRIL) 5 MG tablet   Essential hypertension    bp low over last several weeks. Will stop for now, once feeling better restart at 5mg  dose. Pt agrees.      Relevant Medications   quinapril (ACCUPRIL) 5 MG tablet   Acute bronchitis - Primary    Anticipate acute bronchitis. Given comorbidities, will pursue aggressive treatment with zpack. Push fluids and rest. See pt instructions for plan.          Follow up plan: Return if symptoms worsen or fail to improve.

## 2015-09-14 NOTE — Addendum Note (Signed)
Addended by: Annamarie MajorFUQUAY, LUGENE S on: 09/14/2015 06:04 PM   Modules accepted: Orders

## 2015-09-14 NOTE — Assessment & Plan Note (Signed)
bp low over last several weeks. Will stop for now, once feeling better restart at 5mg  dose. Pt agrees.

## 2015-09-14 NOTE — Patient Instructions (Addendum)
Hold quinapril for now. May restart 5mg  once feeling better. Monitor for persistent low blood pressures.  I'm suspicious for bronchitis - treat with plenty of fluids because I think you are somewhat dehydrated. Zpack antibiotic to cover bacterial infection. Ibuprofen 400mg  twice daily with food for inflammation in sinuses and lungs. Ok to continue benadryl and zyrtec.  Let us know if fever >101 or worsening productive cough or not improving as directed.

## 2015-09-27 ENCOUNTER — Ambulatory Visit (INDEPENDENT_AMBULATORY_CARE_PROVIDER_SITE_OTHER): Payer: No Typology Code available for payment source | Admitting: Internal Medicine

## 2015-09-27 ENCOUNTER — Other Ambulatory Visit (INDEPENDENT_AMBULATORY_CARE_PROVIDER_SITE_OTHER): Payer: No Typology Code available for payment source | Admitting: *Deleted

## 2015-09-27 ENCOUNTER — Telehealth: Payer: Self-pay | Admitting: Internal Medicine

## 2015-09-27 ENCOUNTER — Encounter: Payer: Self-pay | Admitting: Internal Medicine

## 2015-09-27 VITALS — BP 108/62 | HR 74 | Temp 97.7°F | Resp 12 | Wt 124.8 lb

## 2015-09-27 DIAGNOSIS — E103299 Type 1 diabetes mellitus with mild nonproliferative diabetic retinopathy without macular edema, unspecified eye: Secondary | ICD-10-CM | POA: Diagnosis not present

## 2015-09-27 LAB — POCT GLYCOSYLATED HEMOGLOBIN (HGB A1C): Hemoglobin A1C: 7.6

## 2015-09-27 MED ORDER — INSULIN LISPRO 100 UNIT/ML CARTRIDGE: 10.0000 [IU] | Freq: Every day | SUBCUTANEOUS | Status: DC

## 2015-09-27 MED ORDER — INSULIN PEN NEEDLE 32G X 4 MM MISC: Status: DC

## 2015-09-27 NOTE — Telephone Encounter (Signed)
Felicia ComptonKarla from [facility] called need clarification on medication, Humalog cartilage was sent , but they need Humalog quick pen, please advise [phone]

## 2015-09-27 NOTE — Progress Notes (Signed)
Patient ID: Felicia [name], female   DOB: 1961/02/02, 54 y.o.   MRN: [id number]  HPI: Felicia [name] is a 54 y.o.-year-old female, returning for follow-up for DM1, uncontrolled, with complications (mild retinopathy, gastroparesis). Last visit 3 months.  Since last visit, she was admitted on 09/01/2015 with acute appendicitis. Since the surgery, her sugars are higher.   She had sinus infection and bronchitis. She still has cough and congestion.  Patient has been diagnosed with diabetes in 54 (age 60); she started on insulin at dx. She was on a pump x 5 years (~2010), but did not like it. Sugars were not better when she used the pump. Not interested to get back on it.  Last hemoglobin A1c was: Lab Results  Component Value Date   HGBA1C 7.7* 06/07/2015   HGBA1C 7.5* 03/23/2015   HGBA1C 8.2* 11/15/2014   She was on: - Lantus 6 units in am and 6 units in hs.  - Regular insulin 10 min before the meal - ICR B: 30:1, L: 20:1, D: 20:1; target 150, ISF 60 >> 2-3 units per meal Tried Toujeo >> allergy: CP, SOB.  At last visit, we changed to: - Lantus:  6 units in am   6 units at bedtime >> 5 >> back to 6 now  - Regular Insulin dose (10 min before a meal): - insulin to carb ratio (ICR)  B'fast: 30:1 >> 25:1 (2 units)  Lunch: 20:1 (2 units)  Dinner: 20:1 (2-3 units) - target 150 >> 140 - insulin sensitivity factor (ISF) 60  140-200: + 1 unit 201-260: + 2 units 261-320: + 3 units 321-380: + 4 units >380: + 5 units Please do not correct bedtime sugars <300.   Meter: ReliOn, had an AccuChek  Pt checks her sugars 4-6 a day and they are - per detailed log which we reviewed together): - am: 66, 73, 74, 109-219, 252 >> 56 (overcorrection at night), 77-213, 270 - 2h after b'fast: 71, 228 >> n/c - before lunch: 72-142, 211, 288 >> 64-227, 248 - 2h after lunch: 166, 222 >> 226 - before dinner: 108-156, 256, 331, 336 >> 74, 76-235, 306 - 2h after dinner: 187, 286 >> 243 - bedtime:  86-252, 297 >> 82, 92-339 - nighttime: 3 am: 51 >> no checks Lowest sugar was 51 x1, 66 x1; she has hypoglycemia awareness at 70s. No recent (last 10 years) hypoglycemia admission. Does have a glucagon kit at home. Highest sugar was 300s. No recent DKA admissions.    Pt's meals are: - Breakfast: protein bar + fruit - 10 am snack  - Lunch: apple + cheese, carrots - Snack bar: 15g carbs - Glucerna - Dinner: meat + 1/2 backed potato + veggies/salad - Snacks: popcorn, salty snacks: potato chips  For exercise, she is walking 3-4 times a week.  - ? mild CKD, last BUN/creatinine:  Lab Results  Component Value Date   BUN 17 09/01/2015   CREATININE 0.95 09/01/2015  + Qunapril 10 >> 5 mg daily. - last set of lipids: Lab Results  Component Value Date   CHOL 191 03/23/2015   HDL 73.10 03/23/2015   LDLCALC 105* 03/23/2015   TRIG 66.0 03/23/2015   CHOLHDL 3 03/23/2015  + Red yeast rice + CoQ10 + Mg. Tried Crestor and Zetia >> leg cramps - last eye exam was in 09/2014. + mild DR.  - no numbness and tingling in her feet.  Last TSH: Lab Results  Component Value Date   TSH  1.94 11/15/2014   She also has hypertension and hyperlipidemia.  ROS: Constitutional: no weight gain/loss, no fatigue, no subjective hyperthermia/hypothermia Eyes: no blurry vision, no xerophthalmia ENT: no sore throat, no nodules palpated in throat, no dysphagia/odynophagia, no hoarseness Cardiovascular: no CP/SOB/palpitations/leg swelling Respiratory: no cough/SOB Gastrointestinal: no N/V/D/C Musculoskeletal: no muscle aches/no joint aches Skin: no rashes Neurological: no tremors/numbness/tingling/dizziness  I reviewed pt's medications, allergies, PMH, social hx, family hx, and changes were documented in the history of present illness. Otherwise, unchanged from my initial visit note.  Past Medical History  Diagnosis Date  . Type 1 diabetes mellitus with diabetic retinopathy without macular edema (HCC)     dx  at 54 y/o  . History of diabetic gastroparesis   . HLD (hyperlipidemia)     borderline  . HTN (hypertension)     borderline  . Nephrolithiasis 2005  . Hx: UTI (urinary tract infection)   . Disorder of kidney     has abd kidney (right)  . Systolic murmur 04/736    mild mitral insuff  . IBS (irritable bowel syndrome)   . Osteopenia 2012    per pt   Past Surgical History  Procedure Laterality Date  . Tonsillectomy and adenoidectomy  1975  . Cesarean section  1985  . Orif femur fracture Left 1996    Corrected by patient in history 2016  . Partial hysterectomy  2000    paps by [facility]  . Trigger finger release Left 12/2010    thumb  . Laparoscopic appendectomy N/A 09/01/2015    Procedure: APPENDECTOMY LAPAROSCOPIC;  Surgeon: Judeth Horn, MD;  Location: [facility];  Service: General;  Laterality: N/A;   Social History   Social History  . Marital Status: Married    Spouse Name: N/A  . Number of Children: 1  . Years of Education: N/A   Occupational History  . HR director Other    [place] History Main Topics  . Smoking status: Never Smoker   . Smokeless tobacco: Never Used  . Alcohol Use: No  . Drug Use: No  . Sexual Activity: Not on file   Other Topics Concern  . 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 aspirin 81 MG tablet Take 81 mg by mouth daily.      Marland Kitchen azithromycin (ZITHROMAX) 250 MG tablet Take two tablets on day one followed by one tablet on days 2-5 6 each 0  . cholecalciferol (VITAMIN D) 1000 UNITS tablet Take 1,000 Units by mouth daily.    . Coenzyme Q10 100 MG capsule Take 1 capsule (100 mg total) by mouth daily.    Marland Kitchen glucagon (GLUCAGON EMERGENCY) 1 MG injection Inject 1 mg into the muscle once as needed (in case of severe  hypoglycemia). 1 each 12  . Insulin Glargine (LANTUS SOLOSTAR) 100 UNIT/ML Solostar Pen Use 6 units Lolita in the morning and 7 units at night time (Patient taking differently: Inject 6 Units into the skin 2 (two) times daily. Use 6 units  in the morning and 6 units at night time) 6 mL 1  . insulin regular (HUMULIN R) 100 units/mL injection Based on sliding scale and carbs (Patient taking differently: Inject 8 Units into the skin  3 (three) times daily before meals. Based on sliding scale and carbs) 10 mL 3  . Multiple Vitamin (MULTIVITAMIN) tablet Take 1 tablet by mouth daily.      . quinapril (ACCUPRIL) 5 MG tablet Take 1 tablet (5 mg total) by mouth daily. 90 tablet 1  . Red Yeast Rice Extract ([facility] RED YEAST RICE) 600 MG CAPS Take 1 capsule (600 mg total) by mouth daily.  0   No current facility-administered medications on file prior to visit.   Allergies  Allergen Reactions  . Toujeo Solostar [Insulin Glargine] Shortness Of Breath  . Lactose Intolerance (Gi)   . Magnesium-Containing Compounds Swelling   Family History  Problem Relation Age of Onset  . Diabetes Maternal Grandmother   . Heart failure Maternal Grandmother   . COPD Maternal Grandmother     smoker  . Thyroid disease Maternal Grandmother   . Colon cancer Maternal Grandfather   . Vascular Disease Maternal Grandmother    PE: BP 108/62 mmHg  Pulse 74  Temp(Src) 97.7 F (36.5 C) (Oral)  Resp 12  Wt 124 lb 12.8 oz (56.609 kg)  SpO2 99% Body mass index is 21.41 kg/(m^2).  Wt Readings from Last 3 Encounters:  09/27/15 124 lb 12.8 oz (56.609 kg)  09/14/15 121 lb 12 oz (55.225 kg)  09/01/15 124 lb (56.246 kg)   Constitutional: normal weight, in NAD Eyes: PERRLA, EOMI, no exophthalmos ENT: moist mucous membranes, no thyromegaly, no cervical lymphadenopathy Cardiovascular: RRR, No MRG Respiratory: CTA B Gastrointestinal: abdomen soft, NT, ND, BS+ Musculoskeletal: no deformities, strength intact in all 4 Skin: moist,  warm, no rashes Neurological: no tremor with outstretched hands, DTR normal in all 4  ASSESSMENT: 1. DM1, uncontrolled, with complications - Mild DR - Gastroparesis - This is the reason why she is on regular insulin, and she is taking it 10 minutes  rather than 30 minutes before eating  PLAN:  1. Patient with long-standing, uncontrolled DM1, on insulin therapy. Her diabetes control improved in the last few years, but has higher sugars after her appendectomy (also snusitis + bronchitis since then). I think she would benefit from higher mealtime insulin doses but we have to be careful b/c: - she may have low sugars before meals - she is very insulin sensitive For these reasons, I suggested that she switches from R insulin (uses vials now) to an insulin analogue and also start using a Luxura pen (as this can give her 0.5 units increments) - Humalog rather than Novolog appears to be covered by her insurance.  - time spent with the patient: 40 min, of which >50% was spent in reviewing her pump and CGM downloads, discussing her hypo- and hyper-glycemic episodes, reviewing previous labs and pump settings and developing a plan to avoid hypo- and hyper-glycemia.  Next year, she switches to [facility] and may need to switch to NovoLog [employer] pens will give 0.5 units increments). - At last visit,we decreased her insulin to carb ratio with breakfast to prevent postprandial hyperglycemia. Her target was also decreased from 150 to140 to reduce hyperglycemia. We will continue these settings for now. Will increase mealtime insulin by 0.5 units. - I suggested to: Patient Instructions  Please continue - Lantus:  6 units in am   6 units at bedtime - Stop Regular Insulin and start Novolog in the Luxura pens: - insulin to carb ratio (ICR) - please add 0.5 units to the calculated dose  B'fast: 25:1  Lunch: 20:1   Dinner: 20:1  -  target 140 - insulin sensitivity factor (ISF) 60  140-200: + 1 unit 201-260:  + 2 units 261-320: + 3 units 321-380: + 4 units >380: + 5 units Please do not correct bedtime sugars <300, and only inject 1 unit then.  Please return in 3 months with your sugar log.   - Continue checking sugars at different times of the day - check at least 4 times a day, rotating checks. She is doing a great job with this. - advised for yearly eye exams >> she needs a new one - scheduled in 10/2015 - advised to always have Glu tablets with her. As of now, she carries glucose gel and Kool-Aid in her car. She commutes to work 45 minutes in a.m. and eats breakfast in the car. - I explained her that I can also refer her to DM education >> she refused this at last visit - Checked hemoglobin A1c today >> 7.6% (slightly better) - no signs of other autoimmune disorders, reviewed most recent TSH, which was normal - Return to clinic in 3 mo with sugar log.   - time spent with the patient: 40 min, of which >50% was spent in reviewing her insulin regimen, discussing her hypo- and hyper-glycemic episodes, reviewing previous labs and events since last visit and developing a plan to avoid hypo- and hyper-glycemia.

## 2015-09-27 NOTE — Telephone Encounter (Signed)
Called pharmacy and advised them per Dr Elvera LennoxGherghe that she did mean to send the rx for cartilages. They voiced understanding and will have to order for pt.

## 2015-09-27 NOTE — Patient Instructions (Addendum)
Please continue - Lantus:  6 units in am   6 units at bedtime - Stop Regular Insulin and start Novolog in the Luxura pens: - insulin to carb ratio (ICR) - please add 0.5 units to the calculated dose  B'fast: 25:1  Lunch: 20:1   Dinner: 20:1  - target 140 - insulin sensitivity factor (ISF) 60  140-200: + 1 unit 201-260: + 2 units 261-320: + 3 units 321-380: + 4 units >380: + 5 units Please do not correct bedtime sugars <300, and only inject 1 unit then.  Please return in 3 months with your sugar log.

## 2015-10-13 ENCOUNTER — Other Ambulatory Visit: Payer: Self-pay | Admitting: Endocrinology

## 2015-10-17 LAB — HM DIABETES EYE EXAM

## 2015-10-20 ENCOUNTER — Other Ambulatory Visit: Payer: Self-pay | Admitting: *Deleted

## 2015-10-20 MED ORDER — INSULIN GLARGINE 100 UNIT/ML SOLOSTAR PEN: 6.0000 [IU] | PEN_INJECTOR | Freq: Two times a day (BID) | SUBCUTANEOUS | Status: DC

## 2015-11-14 ENCOUNTER — Encounter: Payer: Self-pay | Admitting: Family Medicine

## 2015-11-20 ENCOUNTER — Other Ambulatory Visit: Payer: Self-pay | Admitting: Internal Medicine

## 2015-11-30 ENCOUNTER — Encounter: Payer: Self-pay | Admitting: Family Medicine

## 2015-11-30 ENCOUNTER — Ambulatory Visit (INDEPENDENT_AMBULATORY_CARE_PROVIDER_SITE_OTHER): Payer: BLUE CROSS/BLUE SHIELD | Admitting: Family Medicine

## 2015-11-30 VITALS — BP 130/70 | HR 92 | Temp 97.5°F | Wt 128.2 lb

## 2015-11-30 DIAGNOSIS — Z1211 Encounter for screening for malignant neoplasm of colon: Secondary | ICD-10-CM | POA: Diagnosis not present

## 2015-11-30 DIAGNOSIS — E103299 Type 1 diabetes mellitus with mild nonproliferative diabetic retinopathy without macular edema, unspecified eye: Secondary | ICD-10-CM | POA: Diagnosis not present

## 2015-11-30 DIAGNOSIS — R1310 Dysphagia, unspecified: Secondary | ICD-10-CM

## 2015-11-30 DIAGNOSIS — K3184 Gastroparesis: Secondary | ICD-10-CM

## 2015-11-30 DIAGNOSIS — R252 Cramp and spasm: Secondary | ICD-10-CM

## 2015-11-30 DIAGNOSIS — E1043 Type 1 diabetes mellitus with diabetic autonomic (poly)neuropathy: Secondary | ICD-10-CM

## 2015-11-30 NOTE — Progress Notes (Signed)
Pre visit review using our clinic review tool, if applicable. No additional management support is needed unless otherwise documented below in the visit note. 

## 2015-11-30 NOTE — Patient Instructions (Addendum)
Trial [facility] for irritable bowel type symptoms We will refer you to rheumatologist and GI doctors.  Return for CPE at next visit.

## 2015-11-30 NOTE — Progress Notes (Signed)
BP 130/70 mmHg  Pulse 92  Temp(Src) 97.5 F (36.4 C) (Oral)  Wt 128 lb 4 oz (58.174 kg)   CC: discuss referrals  Subjective:    Patient ID: Felicia [name], female    DOB: Dec 28, 1960, 55 y.o.   MRN: [id number]  HPI: CARLIA BOMKAMP is a 55 y.o. female presenting on 11/30/2015 for Referral   See prior note 06/2015 for details. Ongoing pain, not improved with d/c statin/zetia even while off RYR, no improvement on magnesium and vit D replacement. Worsening - during the day as well as at night, R>L leg pain. Requests rheum referral.   Ongoing stomach problems for 15+ years. Endorses worsening early satiety. Grazes throughout the day. Feels food>liquid getting stuck in throat - worse with pasta or rice. Ongoing nausea with intermittent vomiting, NBNB. Has h/o diabetic gastroparesis and irritable bowel syndrome. Already avoids gas producing foods. Has tried culturelle without improvement. She has taken reglan around operations but not regularly. Not interested in regular reglan due to possible neurological side effects.   Due for colonoscopy as well.   Relevant past medical, surgical, family and social history reviewed and updated as indicated. Interim medical history since our last visit reviewed. Allergies and medications reviewed and updated. Current Outpatient Prescriptions on File Prior to Visit  Medication Sig  . aspirin 81 MG tablet Take 81 mg by mouth daily.    . cholecalciferol (VITAMIN D) 1000 UNITS tablet Take 1,000 Units by mouth daily.  . Coenzyme Q10 100 MG capsule Take 1 capsule (100 mg total) by mouth daily.  Marland Kitchen glucagon (GLUCAGON EMERGENCY) 1 MG injection Inject 1 mg into the muscle once as needed (in case of severe hypoglycemia).  . Insulin Pen Needle (FIFTY50 PEN NEEDLES) 32G X 4 MM MISC Use 4x a day  . insulin regular (HUMULIN R) 100 units/mL injection Based on sliding scale and carbs (Patient taking differently: Inject 8 Units into the skin 3 (three) times daily before  meals. Based on sliding scale and carbs)  . LANTUS SOLOSTAR 100 UNIT/ML Solostar Pen INJECT 6 UNITS INTO THE SKIN 2 (TWO) TIMES DAILY.  . Multiple Vitamin (MULTIVITAMIN) tablet Take 1 tablet by mouth daily.    . quinapril (ACCUPRIL) 5 MG tablet Take 1 tablet (5 mg total) by mouth daily.  . Red Yeast Rice Extract ([employer]S RED YEAST RICE) 600 MG CAPS Take 1 capsule (600 mg total) by mouth daily.   No current facility-administered medications on file prior to visit.   Past Medical History  Diagnosis Date  . Type 1 diabetes mellitus with diabetic retinopathy without macular edema (HCC)     dx at 55 y/o, background retinopathy  . History of diabetic gastroparesis   . HLD (hyperlipidemia)     borderline  . HTN (hypertension)     borderline  . Nephrolithiasis 2005  . Hx: UTI (urinary tract infection)   . Disorder of kidney     has abd kidney (right)  . Systolic murmur 01/2013    mild mitral insuff  . IBS (irritable bowel syndrome)   . Osteopenia 2012    per pt    Past Surgical History  Procedure Laterality Date  . Tonsillectomy and adenoidectomy  1975  . Cesarean section  1985  . Orif femur fracture Left 1996    Corrected by patient in history 2016  . Partial hysterectomy  2000    paps by [facility]  . Trigger finger release Left 12/2010    thumb  .  Laparoscopic appendectomy N/A 09/01/2015    Procedure: APPENDECTOMY LAPAROSCOPIC;  Surgeon: Jimmye Norman, MD;  Location: [facility] [place] [facility];  Service: General;  Laterality: N/A;    Family History  Problem Relation Age of Onset  . Diabetes Maternal Grandmother   . Heart failure Maternal Grandmother   . COPD Maternal Grandmother     smoker  . Thyroid disease Maternal Grandmother   . Colon cancer Maternal Grandfather   . Vascular Disease Maternal Grandmother     Social History  Substance Use Topics  . Smoking status: Never Smoker   . Smokeless tobacco: Never Used  . Alcohol Use: No    Review of Systems Per HPI unless specifically  indicated in ROS section     Objective:    BP 130/70 mmHg  Pulse 92  Temp(Src) 97.5 F (36.4 C) (Oral)  Wt 128 lb 4 oz (58.174 kg)  Wt Readings from Last 3 Encounters:  11/30/15 128 lb 4 oz (58.174 kg)  09/27/15 124 lb 12.8 oz (56.609 kg)  09/14/15 121 lb 12 oz (55.225 kg)   Body mass index is 22 kg/(m^2).  Physical Exam  Constitutional: She appears well-developed and well-nourished. No distress.  Musculoskeletal: She exhibits no edema.  Nursing note and vitals reviewed.  Results for orders placed or performed in visit on 11/14/15  HM DIABETES EYE EXAM  Result Value Ref Range   HM Diabetic Eye Exam Retinopathy (A) No Retinopathy   Lab Results  Component Value Date   HGBA1C 7.6 09/27/2015    Lab Results  Component Value Date   TSH 1.94 11/15/2014       Assessment & Plan:   Problem List Items Addressed This Visit    Type 1 diabetes mellitus with background diabetic retinopathy (HCC)   Leg cramping    See prior note for details. Will refer to rheum to help r/o rheumatological cause, further eval for FM, CFS, MFPS. Pt requests.      Relevant Orders   Ambulatory referral to Rheumatology   Dysphagia - Primary    Chronic issue. Will refer to GI for further evaluation. Also due for colonoscopy.       Relevant Orders   Ambulatory referral to Gastroenterology   Diabetic gastroparesis associated with type 1 diabetes mellitus (HCC)    H/o this per patient but worsening. Discussed medication therapy for this, pt hesitant for side effects associated with these. Will start with GI referral for further evaluation.       Relevant Orders   Ambulatory referral to Gastroenterology    Other Visit Diagnoses    Special screening for malignant neoplasms, colon        Relevant Orders    Ambulatory referral to Gastroenterology        Follow up plan: Return if symptoms worsen or fail to improve, for annual exam, prior fasting for blood work.

## 2015-12-01 DIAGNOSIS — R1310 Dysphagia, unspecified: Secondary | ICD-10-CM | POA: Insufficient documentation

## 2015-12-01 NOTE — Assessment & Plan Note (Signed)
H/o this per patient but worsening. Discussed medication therapy for this, pt hesitant for side effects associated with these. Will start with GI referral for further evaluation.

## 2015-12-01 NOTE — Assessment & Plan Note (Signed)
Chronic issue. Will refer to GI for further evaluation. Also due for colonoscopy.

## 2015-12-01 NOTE — Assessment & Plan Note (Addendum)
See prior note for details. Will refer to rheum to help r/o rheumatological cause, further eval for FM, CFS, MFPS. Pt requests.

## 2015-12-14 ENCOUNTER — Telehealth: Payer: Self-pay | Admitting: Family Medicine

## 2015-12-14 DIAGNOSIS — R252 Cramp and spasm: Secondary | ICD-10-CM

## 2015-12-14 NOTE — Telephone Encounter (Signed)
plz notify I received office visit from rheum - no autoimmune cause found for cramping. They suggested OTC quinine (small amt tonic water nightly), vit E, or magnesium. Would have her try this and if no better we could try mild muscle relaxant - update me in next few weeks.

## 2015-12-15 NOTE — Telephone Encounter (Signed)
.  left message to have patient return my call.  

## 2015-12-16 ENCOUNTER — Other Ambulatory Visit: Payer: Self-pay | Admitting: Internal Medicine

## 2015-12-18 NOTE — Telephone Encounter (Signed)
Patient returned Dee's call.  I let patient know Dr.Gutierrez' comments and she voiced understanding.

## 2015-12-28 ENCOUNTER — Other Ambulatory Visit (INDEPENDENT_AMBULATORY_CARE_PROVIDER_SITE_OTHER): Payer: BLUE CROSS/BLUE SHIELD | Admitting: *Deleted

## 2015-12-28 ENCOUNTER — Encounter: Payer: Self-pay | Admitting: Internal Medicine

## 2015-12-28 ENCOUNTER — Ambulatory Visit (INDEPENDENT_AMBULATORY_CARE_PROVIDER_SITE_OTHER): Payer: BLUE CROSS/BLUE SHIELD | Admitting: Internal Medicine

## 2015-12-28 VITALS — BP 102/74 | HR 81 | Temp 97.9°F | Resp 12 | Wt 128.8 lb

## 2015-12-28 DIAGNOSIS — E103299 Type 1 diabetes mellitus with mild nonproliferative diabetic retinopathy without macular edema, unspecified eye: Secondary | ICD-10-CM | POA: Diagnosis not present

## 2015-12-28 LAB — POCT GLYCOSYLATED HEMOGLOBIN (HGB A1C): Hemoglobin A1C: 7.3

## 2015-12-28 MED ORDER — INSULIN REGULAR HUMAN 100 UNIT/ML IJ SOLN: 6.0000 [IU] | Freq: Three times a day (TID) | INTRAMUSCULAR | Status: DC

## 2015-12-28 MED ORDER — INSULIN GLARGINE 100 UNIT/ML SOLOSTAR PEN: PEN_INJECTOR | SUBCUTANEOUS | Status: DC

## 2015-12-28 NOTE — Progress Notes (Signed)
Patient ID: CORETHA CRESWELL, female   DOB: 19-Sep-1961, 55 y.o.   MRN: [id number]  HPI: NEEKA URISTA is a 55 y.o.-year-old female, returning for follow-up for DM1, uncontrolled, with complications (mild retinopathy, gastroparesis). Last visit 3 months ago.  Patient has been diagnosed with diabetes in 61 (age 63); she started on insulin at dx. She was on a pump x 5 years (~2010), but did not like it. Sugars were not better when she used the pump. Not interested to get back on it.  Last hemoglobin A1c was: Lab Results  Component Value Date   HGBA1C 7.6 09/27/2015   HGBA1C 7.7* 06/07/2015   HGBA1C 7.5* 03/23/2015   She was on: - Lantus 6 units in am and 6 units in hs.  - Regular insulin 10 min before the meal - ICR B: 30:1, L: 20:1, D: 20:1; target 150, ISF 60 >> 2-3 units per meal Tried Toujeo >> allergy: CP, SOB.  At last visit, we changed to: - Lantus:  6 units in am   6 units at bedtime - R insulin - at the start of the meal (b/c gastroparesis) - insulin to carb ratio (ICR)   B'fast: 25:1  Lunch: 20:1   Dinner: 20:1  - target 140 - insulin sensitivity factor (ISF) 60  140-200: + 1 unit 201-260: + 2 units 261-320: + 3 units 321-380: + 4 units >380: + 5 units Do not correct bedtime sugars <300, and only inject 1 unit then.   Meter: ReliOn, had an AccuChek  Pt checks her sugars 4-5 a day and they are - per detailed log which we reviewed together): - am: 66, 73, 74, 109-219, 252 >> 56 (overcorrection at night), 77-213, 270 >> 66-148, 258, 310 - 2h after b'fast: 71, 228 >> n/c >> 182, 267 - before lunch: 72-142, 211, 288 >> 64-227, 248 >> 84, 104, [id number] - 2h after lunch: 166, 222 >> 226 - before dinner: 108-156, 256, 331, 336 >> 74, 76-235, 306 >> 70-140, 214, 328 - 2h after dinner: 187, 286 >> 243 >> n/c - bedtime: 86-252, 297 >> 82, 92-339 >> 56, 72-307, 325 - nighttime: 3 am: 51 >> no checks >> 52 Lowest sugar was 51 x1, 66 x1 >> 52x1; she has hypoglycemia  awareness at 70s. No recent (last 10 years) hypoglycemia admission. Does have a glucagon kit at home. Highest sugar was 300s. No recent DKA admissions.    Pt's meals are: - Breakfast: protein bar + fruit - 10 am snack  - Lunch: apple + cheese, carrots - Snack bar: 15g carbs - Glucerna - Dinner: meat + 1/2 backed potato + veggies/salad - Snacks: popcorn, salty snacks: potato chips  For exercise, she is walking 3-4 times a week.  - ? mild CKD, last BUN/creatinine:  Lab Results  Component Value Date   BUN 17 09/01/2015   CREATININE 0.95 09/01/2015  + Qunapril 10 >> 5 mg daily. - last set of lipids: Lab Results  Component Value Date   CHOL 191 03/23/2015   HDL 73.10 03/23/2015   LDLCALC 105* 03/23/2015   TRIG 66.0 03/23/2015   CHOLHDL 3 03/23/2015  + Red yeast rice + CoQ10 + Mg. Tried Crestor and Zetia >> leg cramps - last eye exam was in 10/2015. + mild DR.  - no numbness and tingling in her feet.  Last TSH: Lab Results  Component Value Date   TSH 1.94 11/15/2014   She also has hypertension and hyperlipidemia.  ROS: Constitutional: no  weight gain/loss, no fatigue, no subjective hyperthermia/hypothermia Eyes: no blurry vision, no xerophthalmia ENT: no sore throat, no nodules palpated in throat, no dysphagia/odynophagia, no hoarseness Cardiovascular: no CP/SOB/palpitations/leg swelling Respiratory: no cough/SOB Gastrointestinal: no N/V/D/C Musculoskeletal: no muscle aches/no joint aches Skin: no rashes Neurological: no tremors/numbness/tingling/dizziness  I reviewed pt's medications, allergies, PMH, social hx, family hx, and changes were documented in the history of present illness. Otherwise, unchanged from my initial visit note.  Past Medical History  Diagnosis Date  . Type 1 diabetes mellitus with diabetic retinopathy without macular edema (HCC)     dx at 55 y/o, background retinopathy  . History of diabetic gastroparesis   . HLD (hyperlipidemia)     borderline   . HTN (hypertension)     borderline  . Nephrolithiasis 2005  . Hx: UTI (urinary tract infection)   . Disorder of kidney     has abd kidney (right)  . Systolic murmur 02/6658    mild mitral insuff  . IBS (irritable bowel syndrome)   . Osteopenia 2012    per pt   Past Surgical History  Procedure Laterality Date  . Tonsillectomy and adenoidectomy  1975  . Cesarean section  1985  . Orif femur fracture Left 1996    Corrected by patient in history 2016  . Partial hysterectomy  2000    paps by [place]  . Trigger finger release Left 12/2010    thumb  . Laparoscopic appendectomy N/A 09/01/2015    Procedure: APPENDECTOMY LAPAROSCOPIC;  Surgeon: Judeth Horn, MD;  Location: [facility];  Service: General;  Laterality: N/A;   Social History   Social History  . Marital Status: Married    Spouse Name: N/A  . Number of Children: 1  . Years of Education: N/A   Occupational History  . HR director Other    [place] History Main Topics  . Smoking status: Never Smoker   . Smokeless tobacco: Never Used  . Alcohol Use: No  . Drug Use: No  . Sexual Activity: Not on file   Other Topics Concern  . 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 aspirin 81 MG tablet Take 81 mg by mouth daily.      . cholecalciferol (VITAMIN D) 1000 UNITS tablet Take 1,000 Units by mouth daily.    . Coenzyme Q10 100 MG capsule Take 1 capsule (100 mg total) by mouth daily.    Marland Kitchen glucagon (GLUCAGON EMERGENCY) 1 MG injection Inject 1 mg into the muscle once as needed (in case of severe hypoglycemia). 1 each 12  . Insulin Pen Needle (FIFTY50 PEN NEEDLES) 32G X 4 MM MISC Use 4x a day 200 each 11  . insulin regular (HUMULIN R) 100 units/mL injection Based on sliding scale and carbs (Patient  taking differently: Inject 8 Units into the skin 3 (three) times daily before meals. Based on sliding scale and carbs) 10 mL 3  . LANTUS SOLOSTAR 100 UNIT/ML Solostar Pen INJECT 6 UNITS INTO THE SKIN 2 (TWO) TIMES DAILY. 15 mL 0  . LANTUS SOLOSTAR 100 UNIT/ML Solostar Pen INJECT 6 UNITS INTO THE SKIN 2 (TWO) TIMES DAILY. 15 mL 0  . Multiple Vitamin (MULTIVITAMIN) tablet Take 1 tablet by  mouth daily.      . Probiotic Product (PHILLIPS COLON HEALTH PO) Take 1 capsule by mouth daily.    . quinapril (ACCUPRIL) 5 MG tablet Take 1 tablet (5 mg total) by mouth daily. 90 tablet 1  . Red Yeast Rice Extract ([facility] RED YEAST RICE) 600 MG CAPS Take 1 capsule (600 mg total) by mouth daily.  0   No current facility-administered medications on file prior to visit.   Allergies  Allergen Reactions  . Toujeo Solostar [Insulin Glargine] Shortness Of Breath  . Lactose Intolerance (Gi)   . Magnesium-Containing Compounds Swelling   Family History  Problem Relation Age of Onset  . Diabetes Maternal Grandmother   . Heart failure Maternal Grandmother   . COPD Maternal Grandmother     smoker  . Thyroid disease Maternal Grandmother   . Colon cancer Maternal Grandfather   . Vascular Disease Maternal Grandmother    PE: There were no vitals taken for this visit. There is no weight on file to calculate BMI.  Wt Readings from Last 3 Encounters:  11/30/15 128 lb 4 oz (58.174 kg)  09/27/15 124 lb 12.8 oz (56.609 kg)  09/14/15 121 lb 12 oz (55.225 kg)   Constitutional: normal weight, in NAD Eyes: PERRLA, EOMI, no exophthalmos ENT: moist mucous membranes, no thyromegaly, no cervical lymphadenopathy Cardiovascular: RRR, No MRG Respiratory: CTA B Gastrointestinal: abdomen soft, NT, ND, BS+ Musculoskeletal: no deformities, strength intact in all 4 Skin: moist, warm, no rashes Neurological: no tremor with outstretched hands, DTR normal in all 4  ASSESSMENT: 1. DM1, uncontrolled, with complications - Mild DR -  Gastroparesis - This is the reason why she is on regular insulin, and she is taking it 10 minutes  rather than 30 minutes before eating  PLAN:  1. Patient with long-standing, uncontrolled DM1, on insulin therapy. Her diabetes control improved -  we have to be careful b/c: - she may have low sugars before meals - she is very insulin sensitive - We tried to switch to Novolog but she remembered she had low CBGs with Novolog >> did not start. - since sugars before lunch are high >> will decrease the ICR with b'fast - I suggested to:  Patient Instructions  Please change: - Lantus:  6 units in am   6 units at bedtime - R insulin: - insulin to carb ratio (ICR)   B'fast: 25:1 >> 20:1 (if sugars too low before lunch, increase to 22:1)  Lunch: 20:1   Dinner: 20:1  - target 140 - insulin sensitivity factor (ISF) 60  140-200: + 1 unit 201-260: + 2 units 261-320: + 3 units 321-380: + 4 units >380: + 5 units Do not correct bedtime sugars <300, and only inject 1 unit then.    Please return in 3 months with your sugar log.   - Continue checking sugars at different times of the day - check at least 4 times a day, rotating checks. She is doing a great job with this. - advised for yearly eye exams >> had one in 10/2015 - I explained her that I can also refer her to DM education >> she refused this at last visit - Checked hemoglobin A1c today >> 7.3% (better) - no signs of other autoimmune disorders, reviewed most recent TSH, which was normal - Return to clinic in 3 mo with sugar log.   - time spent with the patient: 40 min, of which >50% was spent in reviewing her insulin regimen, discussing her hypo-  and hyper-glycemic episodes, reviewing previous labs and events since last visit and developing a plan to avoid hypo- and hyper-glycemia.

## 2015-12-28 NOTE — Patient Instructions (Signed)
Please change: - Lantus:  6 units in am   6 units at bedtime - R insulin: - insulin to carb ratio (ICR)   B'fast: 25:1 >> 20:1 (if sugars too low before lunch, increase to 22:1)  Lunch: 20:1   Dinner: 20:1  - target 140 - insulin sensitivity factor (ISF) 60  140-200: + 1 unit 201-260: + 2 units 261-320: + 3 units 321-380: + 4 units >380: + 5 units Do not correct bedtime sugars <300, and only inject 1 unit then.    Please return in 3 months with your sugar log.

## 2016-01-02 ENCOUNTER — Telehealth: Payer: Self-pay | Admitting: Internal Medicine

## 2016-01-02 MED ORDER — INSULIN REGULAR HUMAN 100 UNIT/ML IJ SOLN: INTRAMUSCULAR | Status: DC

## 2016-01-02 NOTE — Telephone Encounter (Signed)
Humulin r is needing a PA [facility] to [facility] [place]

## 2016-01-02 NOTE — Telephone Encounter (Signed)
Ins will no longer cover Humulin R. Switching to Novolin R per Dr Elvera Lennox.

## 2016-01-22 ENCOUNTER — Ambulatory Visit: Payer: BLUE CROSS/BLUE SHIELD | Admitting: Internal Medicine

## 2016-02-21 ENCOUNTER — Telehealth: Payer: Self-pay | Admitting: *Deleted

## 2016-02-21 ENCOUNTER — Ambulatory Visit (INDEPENDENT_AMBULATORY_CARE_PROVIDER_SITE_OTHER): Payer: BLUE CROSS/BLUE SHIELD | Admitting: Family Medicine

## 2016-02-21 ENCOUNTER — Encounter: Payer: Self-pay | Admitting: Gastroenterology

## 2016-02-21 ENCOUNTER — Encounter: Payer: Self-pay | Admitting: Family Medicine

## 2016-02-21 ENCOUNTER — Ambulatory Visit (HOSPITAL_COMMUNITY)
Admission: RE | Admit: 2016-02-21 | Discharge: 2016-02-21 | Disposition: A | Discharge status: Home / Self Care | Payer: BLUE CROSS/BLUE SHIELD | Source: Ambulatory Visit | Attending: Internal Medicine | Admitting: Internal Medicine

## 2016-02-21 ENCOUNTER — Ambulatory Visit (INDEPENDENT_AMBULATORY_CARE_PROVIDER_SITE_OTHER): Payer: BLUE CROSS/BLUE SHIELD | Admitting: Gastroenterology

## 2016-02-21 VITALS — BP 124/68 | HR 76 | Ht 64.0 in | Wt 129.4 lb

## 2016-02-21 VITALS — BP 118/62 | HR 75 | Temp 97.7°F | Ht 64.0 in | Wt 128.4 lb

## 2016-02-21 DIAGNOSIS — E785 Hyperlipidemia, unspecified: Secondary | ICD-10-CM | POA: Diagnosis not present

## 2016-02-21 DIAGNOSIS — E1043 Type 1 diabetes mellitus with diabetic autonomic (poly)neuropathy: Secondary | ICD-10-CM

## 2016-02-21 DIAGNOSIS — Z1211 Encounter for screening for malignant neoplasm of colon: Secondary | ICD-10-CM

## 2016-02-21 DIAGNOSIS — M7989 Other specified soft tissue disorders: Secondary | ICD-10-CM | POA: Diagnosis not present

## 2016-02-21 DIAGNOSIS — I10 Essential (primary) hypertension: Secondary | ICD-10-CM | POA: Diagnosis not present

## 2016-02-21 DIAGNOSIS — R252 Cramp and spasm: Secondary | ICD-10-CM

## 2016-02-21 DIAGNOSIS — E11319 Type 2 diabetes mellitus with unspecified diabetic retinopathy without macular edema: Secondary | ICD-10-CM | POA: Diagnosis not present

## 2016-02-21 DIAGNOSIS — R1310 Dysphagia, unspecified: Secondary | ICD-10-CM

## 2016-02-21 DIAGNOSIS — K3184 Gastroparesis: Secondary | ICD-10-CM

## 2016-02-21 DIAGNOSIS — E103299 Type 1 diabetes mellitus with mild nonproliferative diabetic retinopathy without macular edema, unspecified eye: Secondary | ICD-10-CM

## 2016-02-21 MED ORDER — OMEPRAZOLE 20 MG PO CPDR: 20.0000 mg | DELAYED_RELEASE_CAPSULE | Freq: Every day | ORAL | Status: DC

## 2016-02-21 MED ORDER — METOCLOPRAMIDE HCL 5 MG PO TABS: 5.0000 mg | ORAL_TABLET | Freq: Two times a day (BID) | ORAL | Status: DC

## 2016-02-21 MED ORDER — NA SULFATE-K SULFATE-MG SULF 17.5-3.13-1.6 GM/177ML PO SOLN: 1.0000 | Freq: Once | ORAL | Status: AC

## 2016-02-21 NOTE — Patient Instructions (Signed)

## 2016-02-21 NOTE — Addendum Note (Signed)
Addended by: Eustaquio BoydenGUTIERREZ, Gari Hartsell on: 02/21/2016 08:33 AM   Modules accepted: Orders

## 2016-02-21 NOTE — Telephone Encounter (Signed)
plz notify -US negative for DVT.  Will await GI evaluation this afternoon.

## 2016-02-21 NOTE — Assessment & Plan Note (Signed)
Chronic issue, has not been able to see GI yet - will ask to expedite referral, see if we can get her in with PA this week. Likely needs rpt EGD. ?stricture vs worsening gastroparesis. Start reglan, start omeprazole 20mg  daily while she returns to GI.

## 2016-02-21 NOTE — Progress Notes (Addendum)
     02/21/2016 Felicia DallyDebra L Perrault [id number] 28-Nov-1960   History of Present Illness:  A 55 year old female who is previously known to Dr. Rhea BeltonPyrtle in 2014 at which time she was seen for complaints of diarrhea. She was actually scheduled for an EGD and a colonoscopy, which she did not proceed with.  She is here urgently for same-day visit at the request of her PCP, Dr. Sharen HonesGutierrez, for complaints of dysphagia. She tells me that over the past 2 months she has had issues with solid food such as hamburgers, sandwiches, rice getting hung up when she eats. It goes down eventually, but sometimes when she is trying to drink liquids to push it down the liquid comes back up before the food finally moves. She denies any problems with liquid dysphagia. She was given omeprazole 20 mg daily by her PCP today to begin taking. She also apparently has a diagnosis of diabetic gastroparesis from when she was in [place], but denies any complaints of vomiting or severe nausea. She is suspected to have irritable bowel with alternating bowel habits and bloating as well.    Current Medications, Allergies, Past Medical History, Past Surgical History, Family History and Social History were reviewed in [facility] electronic medical record.   Physical Exam: BP 124/68 mmHg  Pulse 76  Ht 5\' 4"  (1.626 m)  Wt 129 lb 6.4 oz (58.695 kg)  BMI 22.20 kg/m2 General: Well developed white female in no acute distress Head: Normocephalic and atraumatic Eyes:  Sclerae anicteric, conjunctiva pink  Ears: Normal auditory acuity Lungs: Clear throughout to auscultation Heart: Regular rate and rhythm Abdomen: Soft, non-distended.  Normal bowel sounds.  Non-tender. Rectal:  Will be done at the time of colonoscopy. Musculoskeletal: Symmetrical with no gross deformities  Extremities: No edema  Neurological: Alert oriented x 4, grossly non-focal Psychological:  Alert and cooperative. Normal mood and affect  Assessment and  Recommendations: -Dysphagia:  To solid foods only.  Worsening over the past 2 months.  ? Stricture, etc.  Will schedule for EGD with possible dilation.  She was given omeprazole 20 mg daily by her PCP today, which she will begin taking. -CRC screening:  Will schedule for colonoscopy as well.  *The risks, benefits, and alternatives to EGD with dilation and colonoscopy were discussed with the patient and she consents to proceed.    Addendum: Reviewed and agree with initial management. Beverley FiedlerJay M Pyrtle, MD

## 2016-02-21 NOTE — Progress Notes (Signed)
BP 118/62 mmHg  Pulse 75  Temp(Src) 97.7 F (36.5 C)  Ht 5\' 4"  (1.626 m)  Wt 128 lb 6.4 oz (58.242 kg)  BMI 22.03 kg/m2  SpO2 98%   CC: dysphagia  Subjective:    Patient ID: Felicia [name], female    DOB: 11-20-1960, 55 y.o.   MRN: [id number]  HPI: Felicia [name] is a 55 y.o. female presenting on 02/21/2016 for Dysphagia and Leg Swelling   [place]  See prior note for details - ongoing GI problems for 15+ yrs with dysphagia and early satiety. From prior visit: Feels food>liquid getting stuck in throat - worse with pasta or rice. Ongoing nausea with intermittent vomiting, NBNB. Has h/o diabetic gastroparesis and irritable bowel syndrome. Already avoids gas producing foods. Has tried culturelle without improvement. She has taken reglan around operations but not regularly. Not interested in regular reglan due to possible neurological side effects. Last visit 11/2015 we referred her to GI. First appointment available was May - pending next month.   Notices worsening GERD 3-4 times a week over last 2 months. Also notices any food/liquid gets stuck in throat - happens all meals. Has led to liquid emesis (whatever she just drank). These episodes are scary and painful. Endorses choking frequently. Does not use straws.   Rheum eval unrevealing for leg cramping always R>L. Advised OTC quinine vit E, magnesium. She did not tolerate magnesium due to nausea. Noticing R leg staying swollen. Denies HRT, denies recent prolonged travel.   Relevant past medical, surgical, family and social history reviewed and updated as indicated. Interim medical history since our last visit reviewed. Allergies and medications reviewed and updated. Current Outpatient Prescriptions on File Prior to Visit  Medication Sig  . aspirin 81 MG tablet Take 81 mg by mouth daily.    . Coenzyme Q10 100 MG capsule Take 1 capsule (100 mg total) by mouth daily.  Marland Kitchen. glucagon (GLUCAGON  EMERGENCY) 1 MG injection Inject 1 mg into the muscle once as needed (in case of severe hypoglycemia).  . Insulin Glargine (LANTUS SOLOSTAR) 100 UNIT/ML Solostar Pen INJECT 6 UNITS INTO THE SKIN 2 (TWO) TIMES DAILY.  Marland Kitchen. Insulin Pen Needle (FIFTY50 PEN NEEDLES) 32G X 4 MM MISC Use 4x a day  . insulin regular (NOVOLIN R) 100 units/mL injection Inject 0.06 mLs (6 Units total) into the skin 3 (three) times daily before meals. Based on sliding scale and carbs.  . Multiple Vitamin (MULTIVITAMIN) tablet Take 1 tablet by mouth daily.    . Probiotic Product (PHILLIPS COLON HEALTH PO) Take 1 capsule by mouth daily.  . quinapril (ACCUPRIL) 5 MG tablet Take 1 tablet (5 mg total) by mouth daily.  . Red Yeast Rice Extract ([facility] RED YEAST RICE) 600 MG CAPS Take 1 capsule (600 mg total) by mouth daily.   No current facility-administered medications on file prior to visit.    Review of Systems Per HPI unless specifically indicated in ROS section     Objective:    BP 118/62 mmHg  Pulse 75  Temp(Src) 97.7 F (36.5 C)  Ht 5\' 4"  (1.626 m)  Wt 128 lb 6.4 oz (58.242 kg)  BMI 22.03 kg/m2  SpO2 98%  Wt Readings from Last 3 Encounters:  02/21/16 128 lb 6.4 oz (58.242 kg)  12/28/15 128 lb 12.8 oz (58.423 kg)  11/30/15 128 lb 4 oz (58.174 kg)    Physical Exam  Constitutional: She appears well-developed and well-nourished. No  distress.  Abdominal: Soft. Normal appearance and bowel sounds are normal. She exhibits no distension and no mass. There is no hepatosplenomegaly. There is no tenderness. There is no rigidity, no rebound, no guarding, no CVA tenderness and negative Murphy's sign.  Musculoskeletal: She exhibits edema (1+ on right).  Tender to palpation of calf and posterior thigh without palpable cords  Nursing note and vitals reviewed.  Results for orders placed or performed in visit on 12/28/15  POCT HgB A1C  Result Value Ref Range   Hemoglobin A1C 7.3       Assessment & Plan:   Problem List  Items Addressed This Visit    Diabetic gastroparesis associated with type 1 diabetes mellitus (HCC)    Trial reglan given worsening symptoms despite pt hesitance for neurological side effects.      Relevant Orders   Ambulatory referral to Gastroenterology   Leg cramping    Increased water intake has helped cramping. Now with leg swelling - will check B LE ultrasounds to r/o DVT as cause of sxs. Pt agrees with plan.      Dysphagia - Primary    Chronic issue, has not been able to see GI yet - will ask to expedite referral, see if we can get her in with PA this week. Likely needs rpt EGD. ?stricture vs worsening gastroparesis. Start reglan, start omeprazole  daily while she returns to GI.       Relevant Orders   Ambulatory referral to Gastroenterology   Right leg swelling    R>L evident today - new. Check LE dopplers to r/o DVT as cause of pain/cramping and swelling.      Relevant Orders   US Venous Img Lower Bilateral       Follow up plan: Return if symptoms worsen or fail to improve.  [name], MD

## 2016-02-21 NOTE — Telephone Encounter (Signed)
Patient notified and verbalized understanding. She asks what the next step is since her leg still swells and hurts.

## 2016-02-21 NOTE — Assessment & Plan Note (Signed)
Increased water intake has helped cramping. Now with leg swelling - will check B LE ultrasounds to r/o DVT as cause of sxs. Pt agrees with plan.

## 2016-02-21 NOTE — Assessment & Plan Note (Signed)
Trial reglan given worsening symptoms despite pt hesitance for neurological side effects.

## 2016-02-21 NOTE — Assessment & Plan Note (Addendum)
R>L evident today - new. Check LE dopplers to r/o DVT as cause of pain/cramping and swelling.

## 2016-02-21 NOTE — Telephone Encounter (Signed)
Left message to return call 

## 2016-02-21 NOTE — Telephone Encounter (Signed)
Felicia HiddenGary from vascular called- Right leg US was negative for DVT. He sent her home and said we would call her with further.

## 2016-02-21 NOTE — Patient Instructions (Addendum)
Pass by Marion's office to expedite GI referral ASAP.  Pass by Marion's office for venous ultrasound referral.  Start omeprazole 20mg  daily as well as reglan 5mg  with meals - watch for any involuntary movements with this medicine.

## 2016-02-21 NOTE — Progress Notes (Signed)
Pre visit review using our clinic review tool, if applicable. No additional management support is needed unless otherwise documented below in the visit note. 

## 2016-02-23 ENCOUNTER — Other Ambulatory Visit: Payer: Self-pay | Admitting: Obstetrics and Gynecology

## 2016-02-23 DIAGNOSIS — Z1231 Encounter for screening mammogram for malignant neoplasm of breast: Secondary | ICD-10-CM

## 2016-02-23 NOTE — Telephone Encounter (Signed)
Pt called back wanting to know what is the next step  For her leg.  She stated her leg is more swollen and is very painful now around ankle area Best number [phone]

## 2016-02-27 NOTE — Telephone Encounter (Signed)
I apologize - I must have accidentally reviewed and it went away from my in basket. However I never received 02/23/2016 message either.  Spoke with patient.  Ongoing R sided leg swelling with pain at ankle, ongoing cramping (although somewhat improved with increased water intake) Suggested hold RYR and CoQ 10 for next 2 weeks to see if any improvement. Will also refer to [facility] for further evaluation of unexplained unilateral leg swelling and pain. She did have unilateral pitting edema on my evaluation last week. Reviewed abd/pelvic CT with contrast from 08/2015 with patient - reassuringly no evidence of anything intra abdominal that could cause vascular compression

## 2016-02-27 NOTE — Telephone Encounter (Signed)
Calling about her leg. Wants call back today. Has been waiting since last week.

## 2016-02-27 NOTE — Telephone Encounter (Signed)
Pt called back wanting kim to call her  Best number to cll [phone]

## 2016-02-28 ENCOUNTER — Other Ambulatory Visit: Payer: Self-pay | Admitting: Family Medicine

## 2016-02-28 DIAGNOSIS — M7989 Other specified soft tissue disorders: Secondary | ICD-10-CM

## 2016-02-29 ENCOUNTER — Ambulatory Visit
Admission: RE | Admit: 2016-02-29 | Discharge: 2016-02-29 | Disposition: A | Discharge status: Home / Self Care | Payer: BLUE CROSS/BLUE SHIELD | Source: Ambulatory Visit | Attending: Obstetrics and Gynecology | Admitting: Obstetrics and Gynecology

## 2016-02-29 DIAGNOSIS — Z1231 Encounter for screening mammogram for malignant neoplasm of breast: Secondary | ICD-10-CM | POA: Diagnosis not present

## 2016-03-01 ENCOUNTER — Other Ambulatory Visit: Payer: Self-pay | Admitting: *Deleted

## 2016-03-01 DIAGNOSIS — I739 Peripheral vascular disease, unspecified: Secondary | ICD-10-CM

## 2016-03-04 ENCOUNTER — Other Ambulatory Visit (INDEPENDENT_AMBULATORY_CARE_PROVIDER_SITE_OTHER): Payer: BLUE CROSS/BLUE SHIELD

## 2016-03-04 ENCOUNTER — Ambulatory Visit (INDEPENDENT_AMBULATORY_CARE_PROVIDER_SITE_OTHER): Payer: BLUE CROSS/BLUE SHIELD | Admitting: Internal Medicine

## 2016-03-04 ENCOUNTER — Encounter: Payer: Self-pay | Admitting: Internal Medicine

## 2016-03-04 VITALS — BP 120/68 | HR 72 | Temp 97.6°F | Wt 131.8 lb

## 2016-03-04 DIAGNOSIS — S50361A Insect bite (nonvenomous) of right elbow, initial encounter: Secondary | ICD-10-CM

## 2016-03-04 DIAGNOSIS — W57XXXA Bitten or stung by nonvenomous insect and other nonvenomous arthropods, initial encounter: Secondary | ICD-10-CM

## 2016-03-04 DIAGNOSIS — M7989 Other specified soft tissue disorders: Secondary | ICD-10-CM

## 2016-03-04 LAB — BASIC METABOLIC PANEL
BUN: 19 mg/dL (ref 6–23)
CO2: 29 mEq/L (ref 19–32)
Calcium: 8.9 mg/dL (ref 8.4–10.5)
Chloride: 101 mEq/L (ref 96–112)
Creatinine, Ser: 0.91 mg/dL (ref 0.40–1.20)
GFR: 68.72 mL/min (ref >60.00)
Glucose, Bld: 302 mg/dL — ABNORMAL HIGH (ref 70–99)
Potassium: 4.6 mEq/L (ref 3.5–5.1)
Sodium: 135 mEq/L (ref 135–145)

## 2016-03-04 LAB — BRAIN NATRIURETIC PEPTIDE: Pro B Natriuretic peptide (BNP): 108 pg/mL — ABNORMAL HIGH (ref 0.0–100.0)

## 2016-03-04 LAB — TSH: TSH: 2.06 u[IU]/mL (ref 0.35–4.50)

## 2016-03-04 NOTE — Progress Notes (Signed)
Pre visit review using our clinic review tool, if applicable. No additional management support is needed unless otherwise documented below in the visit note. 

## 2016-03-04 NOTE — Progress Notes (Signed)
Subjective:    Patient ID: Felicia [name], female    DOB: 1961/03/12, 55 y.o.   MRN: [id number]  HPI  Pt presents to the clinic today with c/o a bite to her left elbow. She noticed it this morning. She reports she knows it is a bite. She was working out in the yard yesterday. It does not itch, burn or hurt. She has noticed some redness and warmth which has improved throughout the day. She has not tried anything OTC.  Review of Systems  Past Medical History  Diagnosis Date  . Type 1 diabetes mellitus with diabetic retinopathy without macular edema (HCC)     dx at 55 y/o, background retinopathy  . History of diabetic gastroparesis   . HLD (hyperlipidemia)     borderline  . HTN (hypertension)     borderline  . Nephrolithiasis 2005  . Hx: UTI (urinary tract infection)   . Disorder of kidney     has abd kidney (right)  . Systolic murmur 0/3212    mild mitral insuff  . IBS (irritable bowel syndrome)   . Osteopenia 2012    per pt    Current Outpatient Prescriptions  Medication Sig Dispense Refill  . aspirin 81 MG tablet Take 81 mg by mouth daily.      . Coenzyme Q10 100 MG capsule Take 1 capsule (100 mg total) by mouth daily.    Marland Kitchen glucagon (GLUCAGON EMERGENCY) 1 MG injection Inject 1 mg into the muscle once as needed (in case of severe hypoglycemia). 1 each 12  . Insulin Glargine (LANTUS SOLOSTAR) 100 UNIT/ML Solostar Pen INJECT 6 UNITS INTO THE SKIN 2 (TWO) TIMES DAILY. 15 mL 11  . Insulin Pen Needle (FIFTY50 PEN NEEDLES) 32G X 4 MM MISC Use 4x a day 200 each 11  . insulin regular (NOVOLIN R) 100 units/mL injection Inject 0.06 mLs (6 Units total) into the skin 3 (three) times daily before meals. Based on sliding scale and carbs. 10 mL 2  . metoCLOPramide (REGLAN) 5 MG tablet Take 1 tablet (5 mg total) by mouth 2 (two) times daily before a meal. 40 tablet 0  . Multiple Vitamin (MULTIVITAMIN) tablet Take 1 tablet by mouth daily.      . Na Sulfate-K Sulfate-Mg Sulf SOLN Take 1 kit by  mouth once. 354 mL 0  . omeprazole (PRILOSEC) 20 MG capsule Take 1 capsule (20 mg total) by mouth daily. 30 capsule 3  . PENNSAID 2 % SOLN Apply 1(one) Pump on skin twice daily to affected area  0  . Probiotic Product (PHILLIPS COLON HEALTH PO) Take 1 capsule by mouth daily.    . quinapril (ACCUPRIL) 5 MG tablet Take 1 tablet (5 mg total) by mouth daily. 90 tablet 1  . Red Yeast Rice Extract ([employer] RED YEAST RICE) 600 MG CAPS Take 1 capsule (600 mg total) by mouth daily.  0   No current facility-administered medications for this visit.    Allergies  Allergen Reactions  . Toujeo Solostar [Insulin Glargine] Shortness Of Breath  . Lactose Intolerance (Gi)   . Magnesium-Containing Compounds Swelling    Family History  Problem Relation Age of Onset  . Diabetes Maternal Grandmother   . Heart failure Maternal Grandmother   . COPD Maternal Grandmother     smoker  . Thyroid disease Maternal Grandmother   . Colon cancer Maternal Grandfather   . Vascular Disease Maternal Grandmother     Social History   Social History  . Marital  Status: Married    Spouse Name: N/A  . Number of Children: 1  . Years of Education: N/A   Occupational History  . HR director Other    [place] History Main Topics  . Smoking status: Never Smoker   . Smokeless tobacco: Never Used  . Alcohol Use: No  . Drug Use: No  . Sexual Activity: Not on file   Other Topics Concern  . 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  Respiratory: Denies difficulty breathing, shortness of breath, cough or sputum production.   Cardiovascular: Denies chest pain, chest tightness, palpitations or swelling in the hands or feet.  Skin: Pt reports bite. Denies redness, rashes,   or ulcercations.    No other  specific complaints in a complete review of systems (except as listed in HPI above).     Objective:   Physical Exam  There were no vitals taken for this visit. Wt Readings from Last 3 Encounters:  02/21/16 129 lb 6.4 oz (58.695 kg)  02/21/16 128 lb 6.4 oz (58.242 kg)  12/28/15 128 lb 12.8 oz (58.423 kg)    General: Appears her stated age, well developed, well nourished in NAD. Skin: 2 pinpoint puncture wounds noted over right medial olecranon. Area surrounded by small are of ecchymosis. No redness, warmth or drainage noted.  BMET    Component Value Date/Time   NA 135 03/04/2016 0816   K 4.6 03/04/2016 0816   CL 101 03/04/2016 0816   CO2 29 03/04/2016 0816   GLUCOSE 302* 03/04/2016 0816   BUN 19 03/04/2016 0816   CREATININE 0.91 03/04/2016 0816   CREATININE 0.90 07/03/2011 1107   CALCIUM 8.9 03/04/2016 0816   GFRNONAA >60 09/01/2015 1835   GFRAA >60 09/01/2015 1835    Lipid Panel     Component Value Date/Time   CHOL 191 03/23/2015 0800   TRIG 66.0 03/23/2015 0800   HDL 73.10 03/23/2015 0800   CHOLHDL 3 03/23/2015 0800   VLDL 13.2 03/23/2015 0800   LDLCALC 105* 03/23/2015 0800    CBC    Component Value Date/Time   WBC 9.6 09/01/2015 1835   RBC 4.11 09/01/2015 1835   HGB 12.0 09/01/2015 1835   HCT 36.0 09/01/2015 1835   PLT 277 09/01/2015 1835   MCV 87.6 09/01/2015 1835   MCH 29.2 09/01/2015 1835   MCHC 33.3 09/01/2015 1835   RDW 12.7 09/01/2015 1835   LYMPHSABS 1.4 07/25/2011 0901   MONOABS 0.4 07/25/2011 0901   EOSABS 0.2 07/25/2011 0901   BASOSABS 0.0 07/25/2011 0901    Hgb A1C Lab Results  Component Value Date   HGBA1C 7.3 12/28/2015         Assessment & Plan:   Insect bite, right elbow:  No concern for necrosis or infection Wash with soap and water Put Neosporin over the affected area Return precautions given  RTC as needed or if symptoms persist or worsen

## 2016-03-04 NOTE — Patient Instructions (Signed)
Insect Bite Mosquitoes, flies, fleas, bedbugs, and many other insects can bite. Insect bites are different from insect stings. A sting is when poison (venom) is injected into the skin. Insect bites can cause pain or itching for a few days, but they are usually not serious. Some insects can spread diseases to people through a bite. SYMPTOMS  Symptoms of an insect bite include:  Itching or pain in the bite area.  Redness and swelling in the bite area.  An open wound (skin ulcer). In many cases, symptoms last for 2-4 days.  DIAGNOSIS  This condition is usually diagnosed based on symptoms and a physical exam. TREATMENT  Treatment is usually not needed for an insect bite. Symptoms often go away on their own. Your health care provider may recommend creams or lotions to help reduce itching. Antibiotic medicines may be prescribed if the bite becomes infected. A tetanus shot may be given in some cases. If you develop an allergic reaction to an insect bite, your health care provider will prescribe medicines to treat the reaction (antihistamines). This is rare. HOME CARE INSTRUCTIONS  Do not scratch the bite area.  Keep the bite area clean and dry. Wash the bite area daily with soap and water as told by your health care provider.  If directed, applyice to the bite area.  Put ice in a plastic bag.  Place a towel between your skin and the bag.  Leave the ice on for 20 minutes, 2-3 times per day.  To help reduce itching and swelling, try applying a baking soda paste, cortisone cream, or calamine lotion to the bite area as told by your health care provider.  Apply or take over-the-counter and prescription medicines only as told by your health care provider.  If you were prescribed an antibiotic medicine, use it as told by your health care provider. Do not stop using the antibiotic even if your condition improves.  Keep all follow-up visits as told by your health care provider. This is  important. PREVENTION   Use insect repellent. The best insect repellents contain:  DEET, picaridin, oil of lemon eucalyptus (OLE), or IR3535.  Higher amounts of an active ingredient.  When you are outdoors, wear clothing that covers your arms and legs.  Avoid opening windows that do not have window screens. SEEK MEDICAL CARE IF:  You have increased redness, swelling, or pain in the bite area.  You have a fever. SEEK IMMEDIATE MEDICAL CARE IF:   You have joint pain.   You have fluid, blood, or pus coming from the bite area.  You have a headache or neck pain.  You have unusual weakness.  You have a rash.  You have chest pain or shortness of breath.  You have abdominal pain, nausea, or vomiting.  You feel unusually tired or sleepy.   This information is not intended to replace advice given to you by your health care provider. Make sure you discuss any questions you have with your health care provider.   Document Released: 11/28/2004 Document Revised: 07/12/2015 Document Reviewed: 03/08/2015 Elsevier Interactive Patient Education 2016 Elsevier Inc.  

## 2016-03-05 ENCOUNTER — Telehealth: Payer: Self-pay | Admitting: Family Medicine

## 2016-03-05 NOTE — Telephone Encounter (Signed)
Patient returned Kim's call. Please call her back at [phone].

## 2016-03-05 NOTE — Telephone Encounter (Signed)
Pt returned your call - please call [phone] Thank you

## 2016-03-05 NOTE — Telephone Encounter (Signed)
Pt returned your call - please call at [phone] Thank you

## 2016-03-05 NOTE — Telephone Encounter (Signed)
Spoke with patient.

## 2016-03-06 ENCOUNTER — Encounter: Payer: BLUE CROSS/BLUE SHIELD | Admitting: Vascular Surgery

## 2016-03-06 ENCOUNTER — Encounter (HOSPITAL_COMMUNITY): Payer: BLUE CROSS/BLUE SHIELD

## 2016-03-08 ENCOUNTER — Encounter: Payer: BLUE CROSS/BLUE SHIELD | Admitting: Vascular Surgery

## 2016-03-08 ENCOUNTER — Encounter (HOSPITAL_COMMUNITY): Payer: BLUE CROSS/BLUE SHIELD

## 2016-03-14 ENCOUNTER — Encounter: Payer: Self-pay | Admitting: Internal Medicine

## 2016-03-14 ENCOUNTER — Encounter: Payer: Self-pay | Admitting: Vascular Surgery

## 2016-03-18 ENCOUNTER — Other Ambulatory Visit: Payer: Self-pay | Admitting: *Deleted

## 2016-03-18 MED ORDER — "INSULIN SYRINGE-NEEDLE U-100 31G X 15/64"" 0.3 ML MISC": Status: DC

## 2016-03-19 ENCOUNTER — Encounter: Payer: Self-pay | Admitting: Vascular Surgery

## 2016-03-19 ENCOUNTER — Ambulatory Visit (HOSPITAL_COMMUNITY)
Admission: RE | Admit: 2016-03-19 | Discharge: 2016-03-19 | Disposition: A | Discharge status: Home / Self Care | Payer: BLUE CROSS/BLUE SHIELD | Source: Ambulatory Visit | Attending: Vascular Surgery | Admitting: Vascular Surgery

## 2016-03-19 ENCOUNTER — Ambulatory Visit (INDEPENDENT_AMBULATORY_CARE_PROVIDER_SITE_OTHER): Payer: BLUE CROSS/BLUE SHIELD | Admitting: Vascular Surgery

## 2016-03-19 ENCOUNTER — Telehealth: Payer: Self-pay | Admitting: Family Medicine

## 2016-03-19 VITALS — BP 133/80 | HR 72 | Temp 97.0°F | Resp 16 | Ht 64.0 in | Wt 129.0 lb

## 2016-03-19 DIAGNOSIS — M7989 Other specified soft tissue disorders: Secondary | ICD-10-CM

## 2016-03-19 DIAGNOSIS — R252 Cramp and spasm: Secondary | ICD-10-CM

## 2016-03-19 DIAGNOSIS — E10319 Type 1 diabetes mellitus with unspecified diabetic retinopathy without macular edema: Secondary | ICD-10-CM | POA: Insufficient documentation

## 2016-03-19 DIAGNOSIS — I739 Peripheral vascular disease, unspecified: Secondary | ICD-10-CM | POA: Diagnosis not present

## 2016-03-19 DIAGNOSIS — I10 Essential (primary) hypertension: Secondary | ICD-10-CM | POA: Insufficient documentation

## 2016-03-19 DIAGNOSIS — E785 Hyperlipidemia, unspecified: Secondary | ICD-10-CM | POA: Diagnosis not present

## 2016-03-19 DIAGNOSIS — R0989 Other specified symptoms and signs involving the circulatory and respiratory systems: Secondary | ICD-10-CM | POA: Diagnosis present

## 2016-03-19 NOTE — Progress Notes (Signed)
HISTORY AND PHYSICAL     CC:  Swelling and pain in right leg Referring Provider:  Gutierrez, Javier, MD  HPI: This is a 55 y.o. female who was referred today for cramping and leg swelling in the right leg.  She states that she has had the cramping for many years, but the swelling started all of a sudden about 1.5 months ago.  She states that the cramping is worse at night and she will have to get up or change her position.  She also finds that she is rubbing her left foot against the back of her calf to try to ease the cramping.  She states sitting makes it worse depending on the type of chair she is in.  Sometimes, ibuprofen will help, but not always.  She states that she had a laparoscopic appendectomy last November and hx of C-section more than 30 years ago.  She states that the back of her calf is sore to touch and this cramping goes up to her mid thigh on the back of her leg.   She did have a venous duplex at an outside facility 02/21/16, which revealed that her valves are competent and she did not have a DVT.  She states that she did try compression stockings, but they were too tight and did not work.  She does get some relief with the swelling in the morning after her legs have been elevated all night.   She did have a CT scan that did reveal she does not have anything compressing the venous system in the pelvis.   She takes insulin for DM.  She does take a daily aspirin.  She is on an ACEI for hypertension.  She does not smoke.  Past Medical History  Diagnosis Date  . Type 1 diabetes mellitus with diabetic retinopathy without macular edema (HCC)     dx at 55 y/o, background retinopathy  . History of diabetic gastroparesis   . HLD (hyperlipidemia)     borderline  . HTN (hypertension)     borderline  . Nephrolithiasis 2005  . Hx: UTI (urinary tract infection)   . Disorder of kidney     has abd kidney (right)  . Systolic murmur 01/2013    mild mitral insuff  . IBS (irritable bowel  syndrome)   . Osteopenia 2012    per pt    Past Surgical History  Procedure Laterality Date  . Tonsillectomy and adenoidectomy  1975  . Cesarean section  1985  . Orif femur fracture Left 1996    Corrected by patient in history 2016  . Partial hysterectomy  2000    paps by [facility]  . Trigger finger release Left 12/2010    thumb  . Laparoscopic appendectomy N/A 09/01/2015    Procedure: APPENDECTOMY LAPAROSCOPIC;  Surgeon: James Wyatt, MD;  Location: [facility] OR;  Service: General;  Laterality: N/A;    Allergies  Allergen Reactions  . Toujeo Solostar [Insulin Glargine] Shortness Of Breath  . Lactose Intolerance (Gi)   . Magnesium-Containing Compounds Swelling    Current Outpatient Prescriptions  Medication Sig Dispense Refill  . aspirin 81 MG tablet Take 81 mg by mouth daily.      . Coenzyme Q10 100 MG capsule Take 1 capsule (100 mg total) by mouth daily.    . glucagon (GLUCAGON EMERGENCY) 1 MG injection Inject 1 mg into the muscle once as needed (in case of severe hypoglycemia). 1 each 12  . Insulin Glargine (LANTUS SOLOSTAR) 100   UNIT/ML Solostar Pen INJECT 6 UNITS INTO THE SKIN 2 (TWO) TIMES DAILY. 15 mL 11  . Insulin Pen Needle (FIFTY50 PEN NEEDLES) 32G X 4 MM MISC Use 4x a day 200 each 11  . insulin regular (NOVOLIN R) 100 units/mL injection Inject 0.06 mLs (6 Units total) into the skin 3 (three) times daily before meals. Based on sliding scale and carbs. 10 mL 2  . Insulin Syringe-Needle U-100 (BD INSULIN SYRINGE ULTRAFINE) 31G X 15/64" 0.3 ML MISC Use to inject insulin 6 times per day 200 each 1  . metoCLOPramide (REGLAN) 5 MG tablet Take 1 tablet (5 mg total) by mouth 2 (two) times daily before a meal. 40 tablet 0  . Multiple Vitamin (MULTIVITAMIN) tablet Take 1 tablet by mouth daily.      . Na Sulfate-K Sulfate-Mg Sulf SOLN Take 1 kit by mouth once. 354 mL 0  . omeprazole (PRILOSEC) 20 MG capsule Take 1 capsule (20 mg total) by mouth daily. 30 capsule 3  . PENNSAID  2 % SOLN Apply 1(one) Pump on skin twice daily to affected area  0  . Probiotic Product (PHILLIPS COLON HEALTH PO) Take 1 capsule by mouth daily.    . quinapril (ACCUPRIL) 5 MG tablet Take 1 tablet (5 mg total) by mouth daily. 90 tablet 1  . Red Yeast Rice Extract (CVS RED YEAST RICE) 600 MG CAPS Take 1 capsule (600 mg total) by mouth daily.  0   No current facility-administered medications for this visit.    Family History  Problem Relation Age of Onset  . Diabetes Maternal Grandmother   . Heart failure Maternal Grandmother   . COPD Maternal Grandmother     smoker  . Thyroid disease Maternal Grandmother   . Colon cancer Maternal Grandfather   . Vascular Disease Maternal Grandmother     Social History   Social History  . Marital Status: Married    Spouse Name: N/A  . Number of Children: 1  . Years of Education: N/A   Occupational History  . HR director Other    [place] History Main Topics  . Smoking status: Never Smoker   . Smokeless tobacco: Never Used  . Alcohol Use: No  . Drug Use: No  . Sexual Activity: Not on file   Other Topics Concern  . 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, SNF)      Lives with husband; daughter nearby married      1 dog     REVIEW OF SYSTEMS:   [X] denotes positive finding, [ ] denotes negative finding Cardiac  Comments:  Chest pain or chest pressure:    Shortness of breath upon exertion:    Short of breath when lying flat:    Irregular heart rhythm:        Vascular    Pain in calf, thigh, or hip brought on by ambulation:    Pain in feet at night that wakes you up from your sleep:     Blood clot in your veins:    Leg swelling:  x No from walking       Pulmonary    Oxygen at home:    Productive cough:     Wheezing:         Neurologic    Sudden weakness in arms or legs:     Sudden numbness in arms or legs:     Sudden onset  of difficulty speaking or slurred  speech:    Temporary loss of vision in one eye:     Problems with dizziness:         Gastrointestinal    Blood in stool:     Vomited blood:         Genitourinary    Burning when urinating:     Blood in urine:        Psychiatric    Major depression:         Hematologic    Bleeding problems:    Problems with blood clotting too easily:        Skin    Rashes or ulcers:        Constitutional    Fever or chills:      PHYSICAL EXAMINATION:  Filed Vitals:   03/19/16 1443  BP: 133/80  Pulse: 72  Temp: 97 F (36.1 C)  Resp: 16   Body mass index is 22.13 kg/(m^2).  General:  WDWN in NAD; vital signs documented above Gait: Not observed HENT: WNL, normocephalic Pulmonary: normal non-labored breathing , without Rales, rhonchi,  wheezing Cardiac: regular HR, without  Murmurs, rubs or gallops; without carotid bruits Abdomen: soft, NT, no masses Skin: without rashes Vascular Exam/Pulses:  Right Left  Radial 2+ (normal) 2+ (normal)  DP 2+ (normal) 2+ (normal)  PT 2+ (normal) 2+ (normal)    Extremities: without ischemic changes, without Gangrene , without cellulitis; without open wounds; +pitting edema LLE; soreness to palpation right calf Musculoskeletal: no muscle wasting or atrophy  Neurologic: A&O X 3;  No focal weakness or paresthesias are detected Psychiatric:  The pt has Normal affect.   Non-Invasive Vascular Imaging:   ABI's 03/19/16: Right:  1.27 T Left:  1.22 T  Venous duplex 02/21/16 at [facility]: No evidence of DVT or superficial veous thrombus or incompetence.  Pt meds includes: Statin:  No. Beta Blocker:  No. Aspirin:  Yes.   ACEI:  Yes.   ARB:  No. Other Antiplatelet/Anticoagulant:  No.    ASSESSMENT/PLAN:: 55 y.o. female with 1.5 months of RLE swelling and cramping   -pt had a negative venous duplex 02/21/16. -her arterial blood flow is normal with palpable pedal pulses and normal ABI's -CT scan does revealed no evidence of anything inttra  abodminal that could cause vascular compression -recommend 20-46mHg compression stockings and elevating the foot of the bed as well as elevating her leg during the day if possible   SLeontine Locket PA-C [facility] [phone] Clinic MD:  Pt seen and examined in conjunction with Dr. LKellie Simmering o

## 2016-03-19 NOTE — Telephone Encounter (Signed)
[facility].  Dr.Lawson told patient everything looks fine.  He did an Ultrasound.  He said he doesn't know why her leg is swelling.  Patient wants to know what her next step is because it's swollen and painful.

## 2016-03-20 NOTE — Telephone Encounter (Signed)
Called, left message on answering machine. Will try again later.

## 2016-03-21 ENCOUNTER — Encounter: Payer: BLUE CROSS/BLUE SHIELD | Admitting: Internal Medicine

## 2016-03-21 NOTE — Telephone Encounter (Signed)
Spoke with patient - ongoing pain/swelling. Not rheumatological issue per rheum, not vascular issue per [facility]. Has had extensive evaluation - getting frustrated with ongoing pain/swelling of RLE without answers. Discussed options - holistic approach with Dr Benjamine MolaVann or referral to integrative therapies, sports med evaluation with Dr Katrinka BlazingSmith or [facility] who pt has seen before.  Pt would like second opinion by sports medicine - will refer to Dr Penni BombardKendall with [facility] per pt request. May send my notes and rheum/[facility] notes attn [facility] sports medicine.

## 2016-03-22 NOTE — Telephone Encounter (Signed)
Appt made with Dr Penni BombardKendall. MK

## 2016-03-27 ENCOUNTER — Encounter: Payer: BLUE CROSS/BLUE SHIELD | Admitting: Internal Medicine

## 2016-03-27 ENCOUNTER — Ambulatory Visit: Payer: BLUE CROSS/BLUE SHIELD | Admitting: Internal Medicine

## 2016-03-29 ENCOUNTER — Ambulatory Visit: Payer: BLUE CROSS/BLUE SHIELD | Admitting: Internal Medicine

## 2016-04-04 DIAGNOSIS — M79661 Pain in right lower leg: Secondary | ICD-10-CM | POA: Diagnosis not present

## 2016-04-04 DIAGNOSIS — R600 Localized edema: Secondary | ICD-10-CM | POA: Diagnosis not present

## 2016-04-13 ENCOUNTER — Other Ambulatory Visit: Payer: Self-pay | Admitting: Family Medicine

## 2016-05-06 ENCOUNTER — Encounter: Payer: Self-pay | Admitting: Internal Medicine

## 2016-05-06 ENCOUNTER — Ambulatory Visit (INDEPENDENT_AMBULATORY_CARE_PROVIDER_SITE_OTHER): Payer: BLUE CROSS/BLUE SHIELD | Admitting: Internal Medicine

## 2016-05-06 ENCOUNTER — Telehealth: Payer: Self-pay

## 2016-05-06 ENCOUNTER — Telehealth: Payer: Self-pay | Admitting: Internal Medicine

## 2016-05-06 VITALS — BP 110/60 | HR 70 | Ht 65.0 in | Wt 128.0 lb

## 2016-05-06 DIAGNOSIS — E785 Hyperlipidemia, unspecified: Secondary | ICD-10-CM | POA: Diagnosis not present

## 2016-05-06 DIAGNOSIS — E103299 Type 1 diabetes mellitus with mild nonproliferative diabetic retinopathy without macular edema, unspecified eye: Secondary | ICD-10-CM

## 2016-05-06 LAB — LIPID PANEL
Cholesterol: 251 mg/dL — ABNORMAL HIGH (ref 0–200)
HDL: 77.1 mg/dL (ref >39.00)
LDL Cholesterol: 156 mg/dL — ABNORMAL HIGH (ref 0–99)
NonHDL: 174.18
Total CHOL/HDL Ratio: 3
Triglycerides: 93 mg/dL (ref 0.0–149.0)
VLDL: 18.6 mg/dL (ref 0.0–40.0)

## 2016-05-06 LAB — POCT GLYCOSYLATED HEMOGLOBIN (HGB A1C): Hemoglobin A1C: 7.9

## 2016-05-06 MED ORDER — INSULIN DEGLUDEC 100 UNIT/ML ~~LOC~~ SOPN: 12.0000 [IU] | PEN_INJECTOR | Freq: Every day | SUBCUTANEOUS | Status: DC

## 2016-05-06 NOTE — Telephone Encounter (Signed)
Called patient, left message with lab results and directions, per patient approval to do so in chart. Asked patient to call back if any questions or concerns.

## 2016-05-06 NOTE — Telephone Encounter (Signed)
Patient called, had questions about her labs. I explained labs to patient, she states that she is already eating all the fruits and vegetables that she can eat right now and it does not seem to be doing her any good or bringing it down. Patient requesting some medication if possible to help bring down her labs. Please advise. Thank you!

## 2016-05-06 NOTE — Progress Notes (Addendum)
Patient ID: Felicia [name], female   DOB: 1961-09-29, 55 y.o.   MRN: [id number]  HPI: Felicia [name] is a 55 y.o.-year-old female, returning for follow-up for DM1, dx in 72 (age 75), uncontrolled, with complications (mild retinopathy, gastroparesis). Last visit 4 months ago.  She had a rough 4 mo: husband sick, she has leg cramps.   She was on a pump x 5 years (~2010), but did not like it. Sugars were not better when she used the pump. Not interested to get back on it.  Last hemoglobin A1c was: Lab Results  Component Value Date   HGBA1C 7.3 12/28/2015   HGBA1C 7.6 09/27/2015   HGBA1C 7.7* 06/07/2015   She was on: - Lantus 6 units in am and 6 units in hs.  - Regular insulin 10 min before the meal - ICR B: 30:1, L: 20:1, D: 20:1; target 150, ISF 60 >> 2-3 units per meal Tried Toujeo >> allergy: CP, SOB.  Now on: - Lantus:  6 units in am   6 units at bedtime - R insulin: - insulin to carb ratio (ICR)   B'fast: 25:1 >> 20:1 (if sugars too low before lunch, increase to 22:1)  Lunch: 20:1   Dinner: 20:1  - target 140 - insulin sensitivity factor (ISF) 60  140-200: + 1 unit 201-260: + 2 units 261-320: + 3 units 321-380: + 4 units >380: + 5 units Do not correct bedtime sugars <300, and only inject 1 unit then.   Meter: ReliOn, had an AccuChek  Pt checks her sugars 4-5 a day and they are - per detailed log which we reviewed together): - am: 66, 73, 74, 109-219, 252 >> 56 (overcorrection at night), 77-213, 270 >> 66-148, 258, 310 >> 49, 81-180, 240, 265 - 2h after b'fast: 71, 228 >> n/c >> 182, 267 >> 235 (corrected a low) - before lunch: 72-142, 211, 288 >> 64-227, 248 >> 84, 104, 197-284,335 >> 61-255, 294 - 2h after lunch: 166, 222 >> 226 >> n/c - before dinner: 108-156, 256, 331, 336 >> 74, 76-235, 306 >> 70-140, 214, 328 >> 68-290, 316 x1 - 2h after dinner: 187, 286 >> 243 >> n/c >> 173 - bedtime: 86-252, 297 >> 82, 92-339 >> 56, 72-307, 325 >> 135-298 - nighttime: 3  am: 51 >> no checks >> 52 Lowest sugar was 51 x1, 66 x1 >> 52x1 >> 49x1; she has hypoglycemia awareness at 70s. No recent (last 10 years) hypoglycemia admission. Does have a glucagon kit at home. Highest sugar was 300s. No recent DKA admissions.    Pt's meals are: - Breakfast: protein bar + fruit - 10 am snack  - Lunch: apple + cheese, carrots - Snack bar: 15g carbs - Glucerna - Dinner: meat + 1/2 backed potato + veggies/salad - Snacks: popcorn, salty snacks: potato chips  - ? mild CKD, last BUN/creatinine:  Lab Results  Component Value Date   BUN 19 03/04/2016   CREATININE 0.91 03/04/2016  + Quinapril 10 >> 5 mg daily. - last set of lipids: Lab Results  Component Value Date   CHOL 191 03/23/2015   HDL 73.10 03/23/2015   LDLCALC 105* 03/23/2015   TRIG 66.0 03/23/2015   CHOLHDL 3 03/23/2015  + Red yeast rice + CoQ10. Had nausea from Mg. Tried Crestor and Zetia >> leg cramps - last eye exam was in 10/2015. + mild DR.  - no numbness and tingling in her feet.  Last TSH: Lab Results  Component Value  Date   TSH 2.06 03/04/2016   She also has hypertension and hyperlipidemia.  ROS: Constitutional: no weight gain/loss, no fatigue, no subjective hyperthermia/hypothermia Eyes: no blurry vision, no xerophthalmia ENT: no sore throat, no nodules palpated in throat, no dysphagia/odynophagia, no hoarseness Cardiovascular: no CP/SOB/palpitations/leg swelling Respiratory: no cough/SOB Gastrointestinal: no N/V/D/C Musculoskeletal: no muscle aches/no joint aches Skin: no rashes Neurological: no tremors/numbness/tingling/dizziness  I reviewed pt's medications, allergies, PMH, social hx, family hx, and changes were documented in the history of present illness. Otherwise, unchanged from my initial visit note.  Past Medical History  Diagnosis Date  . Type 1 diabetes mellitus with diabetic retinopathy without macular edema (HCC)     dx at 55 y/o, background retinopathy  . History of  diabetic gastroparesis   . HLD (hyperlipidemia)     borderline  . HTN (hypertension)     borderline  . Nephrolithiasis 2005  . Hx: UTI (urinary tract infection)   . Disorder of kidney     has abd kidney (right)  . Systolic murmur 05/176    mild mitral insuff  . IBS (irritable bowel syndrome)   . Osteopenia 2012    per pt   Past Surgical History  Procedure Laterality Date  . Tonsillectomy and adenoidectomy  1975  . Cesarean section  1985  . Orif femur fracture Left 1996    Corrected by patient in history 2016  . Partial hysterectomy  2000    paps by [place]  . Trigger finger release Left 12/2010    thumb  . Laparoscopic appendectomy N/A 09/01/2015    Procedure: APPENDECTOMY LAPAROSCOPIC;  Surgeon: Judeth Horn, MD;  Location: [facility];  Service: General;  Laterality: N/A;   Social History   Social History  . Marital Status: Married    Spouse Name: N/A  . Number of Children: 1  . Years of Education: N/A   Occupational History  . HR director Other    [place] History Main Topics  . Smoking status: Never Smoker   . Smokeless tobacco: Never Used  . Alcohol Use: No  . Drug Use: No  . Sexual Activity: Not on file   Other Topics Concern  . 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 For [facility] [employer] [facility] [employer] [employer] [facility] [employer] (ALF, SNF)      Lives with husband; daughter nearby married      1 dog   Current Outpatient Prescriptions on File Prior to Visit  Medication Sig Dispense Refill  . aspirin 81 MG tablet Take 81 mg by mouth daily.      . Coenzyme Q10 100 MG capsule Take 1 capsule (100 mg total) by mouth daily.    Marland Kitchen glucagon (GLUCAGON EMERGENCY) 1 MG injection Inject 1 mg into the muscle once as needed (in case of severe hypoglycemia). 1 each 12  . Insulin Glargine (LANTUS SOLOSTAR) 100 UNIT/ML Solostar Pen INJECT 6 UNITS INTO THE SKIN 2 (TWO) TIMES DAILY. 15 mL 11  . Insulin Pen Needle (FIFTY50 PEN NEEDLES) 32G X 4 MM MISC Use 4x a day 200  each 11  . insulin regular (NOVOLIN R) 100 units/mL injection Inject 0.06 mLs (6 Units total) into the skin 3 (three) times daily before meals. Based on sliding scale and carbs. 10 mL 2  . Insulin Syringe-Needle U-100 (BD INSULIN SYRINGE ULTRAFINE) 31G X 15/64" 0.3 ML MISC Use to inject insulin 6 times per day 200 each 1  . Multiple Vitamin (MULTIVITAMIN) tablet Take  1 tablet by mouth daily.      Marland Kitchen omeprazole (PRILOSEC) 20 MG capsule Take 1 capsule (20 mg total) by mouth daily. 30 capsule 3  . PENNSAID 2 % SOLN Apply 1(one) Pump on skin twice daily to affected area  0  . Probiotic Product (PHILLIPS COLON HEALTH PO) Take 1 capsule by mouth daily.    . quinapril (ACCUPRIL) 5 MG tablet TAKE 1 TABLET (5 MG TOTAL) BY MOUTH DAILY. 90 tablet 3  . Red Yeast Rice Extract ([facility] RED YEAST RICE) 600 MG CAPS Take 1 capsule (600 mg total) by mouth daily.  0   No current facility-administered medications on file prior to visit.   Allergies  Allergen Reactions  . Toujeo Solostar [Insulin Glargine] Shortness Of Breath  . Lactose Intolerance (Gi)   . Magnesium-Containing Compounds Swelling   Family History  Problem Relation Age of Onset  . Diabetes Maternal Grandmother   . Heart failure Maternal Grandmother   . COPD Maternal Grandmother     smoker  . Thyroid disease Maternal Grandmother   . Colon cancer Maternal Grandfather   . Vascular Disease Maternal Grandmother    PE: BP 110/60 mmHg  Pulse 70  Ht 5' 5"  (1.651 m)  Wt 128 lb (58.06 kg)  BMI 21.30 kg/m2  SpO2 98% Body mass index is 21.3 kg/(m^2).  Wt Readings from Last 3 Encounters:  05/06/16 128 lb (58.06 kg)  03/19/16 129 lb (58.514 kg)  03/04/16 131 lb 12 oz (59.761 kg)   Constitutional: normal weight, in NAD Eyes: PERRLA, EOMI, no exophthalmos ENT: moist mucous membranes, no thyromegaly, no cervical lymphadenopathy Cardiovascular: RRR, No MRG Respiratory: CTA B Gastrointestinal: abdomen soft, NT, ND, BS+ Musculoskeletal: no  deformities, strength intact in all 4 Skin: moist, warm, no rashes Neurological: no tremor with outstretched hands, DTR normal in all 4  ASSESSMENT: 1. DM1, uncontrolled, with complications - Mild DR - Gastroparesis - This is the reason why she is on regular insulin, and she is taking it 10 minutes  rather than 30 minutes before eating  She had low CBGs with Novolog!  2. HL  PLAN:  1. Patient with long-standing, uncontrolled DM1, on insulin therapy. Her sugars are worse at this visit, as she had a lot of stress at home with her husband being sick and needing surgery and her having leg cramps. She has unexplained low CBG alternating with high CBGs. This limits what we can do with her insulin regimen, but I suggested to switch from Lantus to [place] as this has lower propensity for lows. - I suggested to:  Patient Instructions  Please change from Lantus to [place]iba 12 units in am.  Please continue: - R insulin: - insulin to carb ratio (ICR)   B'fast: 20:1  Lunch: 20:1   Dinner: 20:1  - target 140  - insulin sensitivity factor (ISF) 60: 140-200: + 1 unit 201-260: + 2 units 261-320: + 3 units 321-380: + 4 units >380: + 5 units Do not correct bedtime sugars <300, and only inject 1 unit then.   Please return in 3 months with your sugar log.   Please stop at the lab.  - Continue checking sugars at different times of the day - check at least 4 times a day, rotating checks. She is doing a great job with this. - advised for yearly eye exams >> had one in 10/2015 - I explained her that I can also refer her to DM education >> she refused this at last  visit - Checked hemoglobin A1c today >> 7.9% (worse) - no signs of other autoimmune disorders, reviewed most recent TSH, which was normal - Return to clinic in 3 mo with sugar log.   2. HL - On red yeast Rice + CoQ10 - will recheck lipids today  - time spent with the patient: 40 min, of which >50% was spent in reviewing her insulin  regimen, discussing her hypo- and hyper-glycemic episodes, reviewing previous labs and events since last visit and developing a plan to avoid hypo- and hyper-glycemia.    Office Visit on 05/06/2016  Component Date Value Ref Range Status  . Hemoglobin A1C 05/06/2016 7.9   Final  . Cholesterol 05/06/2016 251* 0 - 200 mg/dL Final   ATP III Classification       Desirable:  < 200 mg/dL               Borderline High:  200 - 239 mg/dL          High:  > = 240 mg/dL  . Triglycerides 05/06/2016 93.0  0.0 - 149.0 mg/dL Final   Normal:  <150 mg/dLBorderline High:  150 - 199 mg/dL  . HDL 05/06/2016 77.10  >39.00 mg/dL Final  . VLDL 05/06/2016 18.6  0.0 - 40.0 mg/dL Final  . LDL Cholesterol 05/06/2016 156* 0 - 99 mg/dL Final  . Total CHOL/HDL Ratio 05/06/2016 3   Final                  Men          Women1/2 Average Risk     3.4          3.3Average Risk          5.0          4.42X Average Risk          9.6          7.13X Average Risk          15.0          11.0                      . NonHDL 05/06/2016 174.18   Final   NOTE:  Non-HDL goal should be 30 mg/dL higher than patient's LDL goal (i.e. LDL goal of < 70 mg/dL, would have non-HDL goal of < 100 mg/dL)  Lipid levels are worse (possibly after her vacation - just returned from [place]). Continue Red Yeast Rice for now and focus on fruit and vegetables this summer.  Philemon Kingdom, MD PhD [facility] Endocrinology

## 2016-05-06 NOTE — Telephone Encounter (Signed)
Any of the medicines will increase her leg cramps. I advised her about a few remedies for these and please ask her to let us know if her cramps become better. At that time, we can maybe switch to a more potent statin, but in the light of what we discussed today at the time of the visit, regarding the severity of her leg cramps, I am reticent to try another statin at the moment.

## 2016-05-06 NOTE — Telephone Encounter (Signed)
PT calling back about lab results, she does have some questions

## 2016-05-06 NOTE — Patient Instructions (Addendum)
Please change from Lantus to [place] 12 units in am.  Please continue: - R insulin: - insulin to carb ratio (ICR)   B'fast: 20:1  Lunch: 20:1   Dinner: 20:1  - target 140  - insulin sensitivity factor (ISF) 60: 140-200: + 1 unit 201-260: + 2 units 261-320: + 3 units 321-380: + 4 units >380: + 5 units Do not correct bedtime sugars <300, and only inject 1 unit then.   Please return in 3 months with your sugar log.   Please stop at the lab.

## 2016-05-08 ENCOUNTER — Telehealth: Payer: Self-pay | Admitting: Internal Medicine

## 2016-05-08 ENCOUNTER — Telehealth: Payer: Self-pay

## 2016-05-08 NOTE — Telephone Encounter (Signed)
PT returning your phone call 

## 2016-05-08 NOTE — Telephone Encounter (Signed)
Called patient back and left voicemail to inform her of Dr.Gherghe's decision not to prescribe any other medication in light of what they discussed at their last office visit, she would like her to try the remedies that she told her about to see if it helps with the leg cramps. I advised patient if she had any questions or concerns to call back and we would discuss further but as of right now Dr.Gherghe wants her to try those remedies for the time being.

## 2016-05-09 NOTE — Telephone Encounter (Signed)
Patient would like to talk to Dr Elvera LennoxGherghe about putting her on Cholesterol medication.  please advise

## 2016-05-09 NOTE — Telephone Encounter (Signed)
PT returning your call again

## 2016-05-10 NOTE — Telephone Encounter (Signed)
Patient has called multiple times, she would like to speak with you about putting her on Cholesterol medication. I advised her the last phone call of your response from the previous conversations. Please advise. Will return patient phone call and advise that I sent message to you and will hope to hear response when you return. Thank you!   Just got off the phone with patient. Patient states she has been on cholesterol medication before, she has always had the leg pain. She is afraid if she does not take something for the Cholesterol she will have issues with her heart. She states she would rather have the leg pain over having heart issues. Please advise. Thanks!

## 2016-05-13 ENCOUNTER — Other Ambulatory Visit: Payer: Self-pay

## 2016-05-13 MED ORDER — PRAVASTATIN SODIUM 40 MG PO TABS: 40.0000 mg | ORAL_TABLET | Freq: Every day | ORAL | Status: DC

## 2016-05-13 NOTE — Telephone Encounter (Signed)
Patient requesting medication for cholesterol. Per Dr.Gherghe, I ordered pravastatin 40mg  daily. Called and left message for patient to notify that medication was sent into pharmacy.

## 2016-05-13 NOTE — Telephone Encounter (Signed)
OK, no problem - let's send a 90 day supply with 3 refills of Pravastatin 40 mg daily.

## 2016-05-15 ENCOUNTER — Telehealth: Payer: Self-pay | Admitting: Internal Medicine

## 2016-05-15 NOTE — Telephone Encounter (Signed)
Pt stated Humulin R is needing a PA, she was provided with a [facility] [phone]

## 2016-05-16 ENCOUNTER — Telehealth: Payer: Self-pay

## 2016-05-16 NOTE — Telephone Encounter (Signed)
There is no significant difference between Novolin R and Humulin R. We usually alternate between these based on insurance preference. Does she want to at least try Novolin R? I cannot find anywhere in the chart where she tried and had a reaction to it. Please let me know if she did.

## 2016-05-16 NOTE — Telephone Encounter (Signed)
Called and spoke with patient. States she has always been on Humalin R, and not Novolin R but RX for the Novolin has been in since 12/2015. Asked MD, she said she would look into it. Will call patient back when I get an answer of what medication to send to pharmacy.

## 2016-05-17 ENCOUNTER — Telehealth: Payer: Self-pay

## 2016-05-17 MED ORDER — INSULIN REGULAR HUMAN 100 UNIT/ML IJ SOLN: INTRAMUSCULAR | Status: DC

## 2016-05-17 NOTE — Telephone Encounter (Signed)
Called and left patient a message regarding the Novolin R and the Humalin R medication per Dr.Gherghe. Advised patient I had [facility] for her to [facility] up and try this medication with her insurance. Advised patient to call back if any issues or concerns.

## 2016-06-06 DIAGNOSIS — M79661 Pain in right lower leg: Secondary | ICD-10-CM | POA: Diagnosis not present

## 2016-06-13 ENCOUNTER — Ambulatory Visit (AMBULATORY_SURGERY_CENTER): Payer: Self-pay | Admitting: *Deleted

## 2016-06-13 VITALS — Ht 64.0 in | Wt 132.0 lb

## 2016-06-13 DIAGNOSIS — R1310 Dysphagia, unspecified: Secondary | ICD-10-CM

## 2016-06-13 DIAGNOSIS — Z1211 Encounter for screening for malignant neoplasm of colon: Secondary | ICD-10-CM

## 2016-06-13 NOTE — Progress Notes (Signed)
No egg or soy allergy known to patient   issues with past sedation with any surgeries  or procedures, no intubation problems - has gastroparesis and gets N/V post op  Takes  diet pills per patient OTC- she does not think they contain phentermine - will ck- instructed off 10 days if so  No home 02 use per patient  No blood thinners per patient  Pt states  issues with constipation -usus MOm prn only  Pt states she has a suprep at home from last scheduled, another not ordered. Pt stated she was going to have her mom drop her off and then return at the end, instructed pt on our policy for care partner, she was not happy, stated maybe if someone complained this would change, we discussed multiple times while going over prep instructions.  She states she will call and RS if she cant work this out

## 2016-06-14 ENCOUNTER — Encounter: Payer: Self-pay | Admitting: Internal Medicine

## 2016-06-18 DIAGNOSIS — R600 Localized edema: Secondary | ICD-10-CM | POA: Diagnosis not present

## 2016-06-18 DIAGNOSIS — M79661 Pain in right lower leg: Secondary | ICD-10-CM | POA: Diagnosis not present

## 2016-06-27 ENCOUNTER — Encounter: Payer: BLUE CROSS/BLUE SHIELD | Admitting: Internal Medicine

## 2016-07-05 HISTORY — PX: COLONOSCOPY: SHX174

## 2016-07-05 HISTORY — PX: ESOPHAGOGASTRODUODENOSCOPY: SHX1529

## 2016-07-11 ENCOUNTER — Ambulatory Visit (AMBULATORY_SURGERY_CENTER): Payer: BLUE CROSS/BLUE SHIELD | Admitting: Internal Medicine

## 2016-07-11 ENCOUNTER — Encounter: Payer: Self-pay | Admitting: Internal Medicine

## 2016-07-11 VITALS — BP 100/45 | HR 66 | Temp 97.5°F | Resp 12 | Ht 64.0 in | Wt 132.0 lb

## 2016-07-11 DIAGNOSIS — K209 Esophagitis, unspecified: Secondary | ICD-10-CM

## 2016-07-11 DIAGNOSIS — Z1211 Encounter for screening for malignant neoplasm of colon: Secondary | ICD-10-CM | POA: Diagnosis present

## 2016-07-11 DIAGNOSIS — R1310 Dysphagia, unspecified: Secondary | ICD-10-CM | POA: Diagnosis not present

## 2016-07-11 LAB — GLUCOSE, CAPILLARY
Glucose-Capillary: 213 mg/dL — ABNORMAL HIGH (ref 65–99)
Glucose-Capillary: 181 mg/dL — ABNORMAL HIGH (ref 65–99)

## 2016-07-11 MED ORDER — PANTOPRAZOLE SODIUM 40 MG PO TBEC: DELAYED_RELEASE_TABLET | ORAL | 1 refills | Status: DC

## 2016-07-11 MED ORDER — SODIUM CHLORIDE 0.9 % IV SOLN: 500.0000 mL | INTRAVENOUS | Status: DC

## 2016-07-11 NOTE — Patient Instructions (Signed)
Discharge instructions given. Normal exams. Prescription sent to pharmacy. Resume previous medications. YOU HAD AN ENDOSCOPIC PROCEDURE TODAY AT THE [facility]:   Refer to the procedure report that was given to you for any specific questions about what was found during the examination.  If the procedure report does not answer your questions, please call your gastroenterologist to clarify.  If you requested that your care partner not be given the details of your procedure findings, then the procedure report has been included in a sealed envelope for you to review at your convenience later.  YOU SHOULD EXPECT: Some feelings of bloating in the abdomen. Passage of more gas than usual.  Walking can help get rid of the air that was put into your GI tract during the procedure and reduce the bloating. If you had a lower endoscopy (such as a colonoscopy or flexible sigmoidoscopy) you may notice spotting of blood in your stool or on the toilet paper. If you underwent a bowel prep for your procedure, you may not have a normal bowel movement for a few days.  Please Note:  You might notice some irritation and congestion in your nose or some drainage.  This is from the oxygen used during your procedure.  There is no need for concern and it should clear up in a day or so.  SYMPTOMS TO REPORT IMMEDIATELY:   Following lower endoscopy (colonoscopy or flexible sigmoidoscopy):  Excessive amounts of blood in the stool  Significant tenderness or worsening of abdominal pains  Swelling of the abdomen that is new, acute  Fever of 100F or higher   Following upper endoscopy (EGD)  Vomiting of blood or coffee ground material  New chest pain or pain under the shoulder blades  Painful or persistently difficult swallowing  New shortness of breath  Fever of 100F or higher  Black, tarry-looking stools  For urgent or emergent issues, a gastroenterologist can be reached at any hour by calling [phone]  [phone].   DIET:  We do recommend a small meal at first, but then you may proceed to your regular diet.  Drink plenty of fluids but you should avoid alcoholic beverages for 24 hours.  ACTIVITY:  You should plan to take it easy for the rest of today and you should NOT DRIVE or use heavy machinery until tomorrow (because of the sedation medicines used during the test).    FOLLOW UP: Our staff will call the number listed on your records the next business day following your procedure to check on you and address any questions or concerns that you may have regarding the information given to you following your procedure. If we do not reach you, we will leave a message.  However, if you are feeling well and you are not experiencing any problems, there is no need to return our call.  We will assume that you have returned to your regular daily activities without incident.  If any biopsies were taken you will be contacted by phone or by letter within the next 1-3 weeks.  Please call us at [phone] if you have not heard about the biopsies in 3 weeks.    SIGNATURES/CONFIDENTIALITY: You and/or your care partner have signed paperwork which will be entered into your electronic medical record.  These signatures attest to the fact that that the information above on your After Visit Summary has been reviewed and is understood.  Full responsibility of the confidentiality of this discharge information lies with you and/or your care-partner.

## 2016-07-11 NOTE — Op Note (Signed)
[facility] Patient Name: Felicia [name] Procedure Date: 07/11/2016 1:50 PM MRN: [id number] Endoscopist: Beverley Fiedler , MD Age: 55 Referring MD:  Date of Birth: June 16, 1961 Gender: Female Account #: [id number] Procedure:                Upper GI endoscopy Indications:              Dysphagia Medicines:                Monitored Anesthesia Care Procedure:                Pre-Anesthesia Assessment:                           - Prior to the procedure, a History and Physical                            was performed, and patient medications and                            allergies were reviewed. The patient's tolerance of                            previous anesthesia was also reviewed. The risks                            and benefits of the procedure and the sedation                            options and risks were discussed with the patient.                            All questions were answered, and informed consent                            was obtained. Prior Anticoagulants: The patient has                            taken no previous anticoagulant or antiplatelet                            agents. ASA Grade Assessment: III - A patient with                            severe systemic disease. After reviewing the risks                            and benefits, the patient was deemed in                            satisfactory condition to undergo the procedure.                           After obtaining informed consent, the endoscope was  passed under direct vision. Throughout the                            procedure, the patient's blood pressure, pulse, and                            oxygen saturations were monitored continuously. The                            Model GIF-HQ190 [id number]) scope was introduced                            through the mouth, and advanced to the second part                            of duodenum. The upper GI endoscopy was                         accomplished without difficulty. The patient                            tolerated the procedure well. Scope In: Scope Out: Findings:                 LA Grade C (one or more mucosal breaks continuous                            between tops of 2 or more mucosal folds, less than                            75% circumference) esophagitis with no bleeding was                            found 36 to 40 cm from the incisors. There was                            fluid in the esophageal body suggestive of stasis                            and dysmotility. Z-line was unremarkable at 40 cm.                           The entire examined stomach was normal.                           The cardia and gastric fundus were normal on                            retroflexion.                           The examined duodenum was normal. Complications:            No immediate complications. Estimated Blood Loss:     Estimated blood loss: none. Impression:               -  LA Grade C reflux esophagitis.                           - Normal stomach.                           - Normal examined duodenum.                           - No specimens collected. Recommendation:           - Patient has a contact number available for                            emergencies. The signs and symptoms of potential                            delayed complications were discussed with the                            patient. Return to normal activities tomorrow.                            Written discharge instructions were provided to the                            patient.                           - Resume previous diet.                           - Continue present medications.                           - Use Protonix (pantoprazole) 40 mg PO BID x 4                            weeks, then once daily thereafter                           - Office follow-up to ensure improvement in                             dysphagia. Beverley FiedlerJay M Benney [place], MD 07/11/2016 2:29:02 PM This report has been signed electronically.

## 2016-07-11 NOTE — Op Note (Signed)
[facility] Patient Name: Felicia [name] Procedure Date: 07/11/2016 2:03 PM MRN: [id number] Endoscopist: Beverley Fiedler , MD Age: 55 Referring MD:  Date of Birth: 1961/06/27 Gender: Female Account #: [id number] Procedure:                Colonoscopy Indications:              Screening for colorectal malignant neoplasm, This                            is the patient's first colonoscopy Medicines:                Monitored Anesthesia Care Procedure:                Pre-Anesthesia Assessment:                           - Prior to the procedure, a History and Physical                            was performed, and patient medications and                            allergies were reviewed. The patient's tolerance of                            previous anesthesia was also reviewed. The risks                            and benefits of the procedure and the sedation                            options and risks were discussed with the patient.                            All questions were answered, and informed consent                            was obtained. Prior Anticoagulants: The patient has                            taken no previous anticoagulant or antiplatelet                            agents. ASA Grade Assessment: III - A patient with                            severe systemic disease. After reviewing the risks                            and benefits, the patient was deemed in                            satisfactory condition to undergo the procedure.                           -  Prior to the procedure, a History and Physical                            was performed, and patient medications and                            allergies were reviewed. The patient's tolerance of                            previous anesthesia was also reviewed. The risks                            and benefits of the procedure and the sedation                            options and risks were discussed with  the patient.                            All questions were answered, and informed consent                            was obtained. Prior Anticoagulants: The patient has                            taken no previous anticoagulant or antiplatelet                            agents. ASA Grade Assessment: III - A patient with                            severe systemic disease. After reviewing the risks                            and benefits, the patient was deemed in                            satisfactory condition to undergo the procedure.                           After obtaining informed consent, the colonoscope                            was passed under direct vision. Throughout the                            procedure, the patient's blood pressure, pulse, and                            oxygen saturations were monitored continuously. The                            Model PCF-H190L [id number]) scope was introduced  through the anus and advanced to the the cecum,                            identified by appendiceal orifice and ileocecal                            valve. The colonoscopy was performed without                            difficulty. The patient tolerated the procedure                            well. The quality of the bowel preparation was                            good. The ileocecal valve, appendiceal orifice, and                            rectum were photographed. Scope In: 2:06:51 PM Scope Out: 2:22:12 PM Scope Withdrawal Time: 0 hours 11 minutes 22 seconds  Total Procedure Duration: 0 hours 15 minutes 21 seconds  Findings:                 The digital rectal exam was normal.                           The entire examined colon appeared normal on direct                            and retroflexion views. Complications:            No immediate complications. Estimated Blood Loss:     Estimated blood loss: none. Impression:               - The  entire examined colon is normal on direct and                            retroflexion views.                           - No specimens collected. Recommendation:           - Patient has a contact number available for                            emergencies. The signs and symptoms of potential                            delayed complications were discussed with the                            patient. Return to normal activities tomorrow.                            Written discharge instructions were provided to the  patient.                           - Resume previous diet.                           - Continue present medications.                           - Repeat colonoscopy in 10 years for screening                            purposes. Beverley FiedlerJay M Keaston Pile, MD 07/11/2016 2:30:46 PM This report has been signed electronically.

## 2016-07-12 ENCOUNTER — Telehealth: Payer: Self-pay | Admitting: *Deleted

## 2016-07-12 NOTE — Telephone Encounter (Signed)
No answer, message left for the patient. 

## 2016-07-19 ENCOUNTER — Other Ambulatory Visit: Payer: Self-pay | Admitting: Internal Medicine

## 2016-07-24 DIAGNOSIS — E103293 Type 1 diabetes mellitus with mild nonproliferative diabetic retinopathy without macular edema, bilateral: Secondary | ICD-10-CM | POA: Diagnosis not present

## 2016-07-30 ENCOUNTER — Ambulatory Visit (INDEPENDENT_AMBULATORY_CARE_PROVIDER_SITE_OTHER): Payer: BLUE CROSS/BLUE SHIELD | Admitting: Internal Medicine

## 2016-07-30 ENCOUNTER — Encounter: Payer: Self-pay | Admitting: Internal Medicine

## 2016-07-30 VITALS — BP 126/84 | HR 74 | Ht 63.5 in | Wt 132.0 lb

## 2016-07-30 DIAGNOSIS — E785 Hyperlipidemia, unspecified: Secondary | ICD-10-CM | POA: Diagnosis not present

## 2016-07-30 DIAGNOSIS — E103299 Type 1 diabetes mellitus with mild nonproliferative diabetic retinopathy without macular edema, unspecified eye: Secondary | ICD-10-CM

## 2016-07-30 LAB — LIPID PANEL
Cholesterol: 159 mg/dL (ref 0–200)
HDL: 63 mg/dL (ref >39.00)
LDL Cholesterol: 82 mg/dL (ref 0–99)
NonHDL: 96.32
Total CHOL/HDL Ratio: 3
Triglycerides: 73 mg/dL (ref 0.0–149.0)
VLDL: 14.6 mg/dL (ref 0.0–40.0)

## 2016-07-30 LAB — POCT GLYCOSYLATED HEMOGLOBIN (HGB A1C): Hemoglobin A1C: 7.1

## 2016-07-30 NOTE — Patient Instructions (Addendum)
Please continue Tresiba 12 units in am.  Please continue: - R insulin: - insulin to carb ratio (ICR)   B'fast: 20:1  Lunch: 20:1   Dinner: 20:1  - target 140  - insulin sensitivity factor (ISF) 60: 140-200: + 1 unit 201-260: + 2 units 261-320: + 3 units 321-380: + 4 units >380: + 5 units  Do not correct bedtime sugars <300, and only inject 1 unit then.   Drink only 4 oz of apple juice if sugars lower at bedtime.  Please return in 3 months with your sugar log.   Please stop at the lab.

## 2016-07-30 NOTE — Progress Notes (Signed)
Patient ID: Felicia [name], female   DOB: [birth year], 55 y.o.   MRN: [id number]  HPI: Felicia [name] is a 55 y.o.-year-old female, returning for follow-up for DM1, dx in 37 (age 55), uncontrolled, with complications (mild retinopathy, gastroparesis). Last visit 3 months ago.  She was on a pump x 5 years (~2010), but did not like it. Sugars were not better when she used the pump. Not interested to get back on it.  Last hemoglobin A1c was: Lab Results  Component Value Date   HGBA1C 7.9 05/06/2016   HGBA1C 7.3 12/28/2015   HGBA1C 7.6 09/27/2015   She was on: - Lantus 6 units in am and 6 units in hs >> Tresiba 12 units - Regular insulin 10 min before the meal - ICR B: 30:1, L: 20:1, D: 20:1; target 150, ISF 60 >> 2-3 units per meal Tried Toujeo >> allergy: CP, SOB.  Now on: - Tresiba 12 units in am - R insulin (Novolin): - insulin to carb ratio (ICR)   B'fast: 20:1   Lunch: 20:1   Dinner: 20:1  - target 140 - insulin sensitivity factor (ISF) 60  140-200: + 1 unit 201-260: + 2 units 261-320: + 3 units 321-380: + 4 units >380: + 5 units Do not correct bedtime sugars <300, and only inject 1 unit then.   Meter: ReliOn, had an AccuChek  Pt checks her sugars 4-5 a day and they are - per detailed log which we reviewed together): - am: 56 (overcorrection at night), 77-213, 270 >> 66-148, 258, 310 >> 49, 81-180, 240, 265 >> 119-228, 337 - 2h after b'fast: 71, 228 >> n/c >> 182, 267 >> 235 (corrected a low) >> 77 - before lunch: 72-142, 211, 288 >> 64-227, 248 >> 84, 104, 197-284,335 >> 61-255, 294 >> 97-174 - 2h after lunch: 166, 222 >> 226 >> n/c - before dinner: 108-156, 256, 331, 336 >> 74, 76-235, 306 >> 70-140, 214, 328 >> 68-290, 316 x1 >> 96-208, 289 - 2h after dinner: 187, 286 >> 243 >> n/c >> 173 >> n/c - bedtime: 86-252, 297 >> 82, 92-339 >> 56, 72-307, 325 >> 135-298 >> 59, 67-184, 240 - nighttime: 3 am: 51 >> no checks >> 52 >> 50 Lowest sugar was 51 x1, 66 x1 >>  52x1 >> 49x1 >> 50's - not as many ; she has hypoglycemia awareness at 70s. No recent (last 10 years) hypoglycemia admission. Does have a glucagon kit at home. Highest sugar was 300s. No recent DKA admissions.    Pt's meals are: - Breakfast: protein bar + fruit - 10 am snack  - Lunch: apple + cheese, carrots - Snack bar: 15g carbs - Glucerna - Dinner: meat + 1/2 backed potato + veggies/salad - Snacks: popcorn, salty snacks: potato chips  - ? mild CKD, last BUN/creatinine:  Lab Results  Component Value Date   BUN 19 03/04/2016   CREATININE 0.91 03/04/2016  + Quinapril 10 >> 5 mg daily. - last set of lipids: Lab Results  Component Value Date   CHOL 251 (H) 05/06/2016   HDL 77.10 05/06/2016   LDLCALC 156 (H) 05/06/2016   TRIG 93.0 05/06/2016   CHOLHDL 3 05/06/2016  She was on Red yeast rice + CoQ10. In 04/2016, we started Pravastatin. Had nausea from Mg. Tried Crestor and Zetia >> leg cramps. The Pravastatin also causes leg cramps.  - last eye exam was in 10/2015. + mild DR.  - no numbness and tingling in her feet.  Last TSH: Lab Results  Component Value Date   TSH 2.06 03/04/2016   She also has hypertension and hyperlipidemia.  ROS: Constitutional: no weight gain/loss, no fatigue, no subjective hyperthermia/hypothermia Eyes: no blurry vision, no xerophthalmia ENT: no sore throat, no nodules palpated in throat, no dysphagia/odynophagia, no hoarseness Cardiovascular: no CP/SOB/palpitations/leg swelling Respiratory: no cough/SOB Gastrointestinal: no N/V/D/C Musculoskeletal: no muscle aches/no joint aches Skin: no rashes Neurological: no tremors/numbness/tingling/dizziness  I reviewed pt's medications, allergies, PMH, social hx, family hx, and changes were documented in the history of present illness. Otherwise, unchanged from my initial visit note.  Past Medical History:  Diagnosis Date  . Allergy   . Anemia    past history long ago  . Disorder of kidney    has abd  kidney (right)  . GERD (gastroesophageal reflux disease)   . History of diabetic gastroparesis   . HLD (hyperlipidemia)    borderline  . HTN (hypertension)    borderline  . Hx: UTI (urinary tract infection)   . Hypertension   . IBS (irritable bowel syndrome)   . Nephrolithiasis 2005  . Osteopenia 2012   per pt  . Systolic murmur 12/5954   mild mitral insuff  . Type 1 diabetes mellitus with diabetic retinopathy without macular edema (HCC)    dx at 55 y/o, background retinopathy   Past Surgical History:  Procedure Laterality Date  . CESAREAN SECTION  1985  . COLONOSCOPY  07/2016   WNL ([place])  . ESOPHAGOGASTRODUODENOSCOPY  07/2016   LA Grade C reflux esophagitis, concern for stasis/dysmotility ([place])  . LAPAROSCOPIC APPENDECTOMY N/A 09/01/2015   Procedure: APPENDECTOMY LAPAROSCOPIC;  Surgeon: Judeth Horn, MD;  Location: [place];  Service: General;  Laterality: N/A;  . ORIF FEMUR FRACTURE Left 1996   Corrected by patient in history 2016  . PARTIAL HYSTERECTOMY  2000   paps by [facility]  . TONSILLECTOMY AND ADENOIDECTOMY  1975  . TRIGGER FINGER RELEASE Left 12/2010   thumb   Social History   Social History  . Marital status: Married    Spouse name: N/A  . Number of children: 1  . Years of education: N/A   Occupational History  . HR director Other    [place] History Main Topics  . Smoking status: Never Smoker  . Smokeless tobacco: Never Used  . Alcohol use No  . Drug use: No  . Sexual activity: Not on file   Other Topics Concern  . 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 aspirin 81 MG tablet Take 81 mg by mouth daily.      . BD INSULIN SYRINGE ULTRAFINE 31G X 15/64" 0.3 ML MISC USE TO INJECT INSULIN 6 TIMES PER DAY 200 each 1   . Coenzyme Q10 100 MG capsule Take 1 capsule (100 mg total) by mouth daily.    Marland Kitchen glucagon (GLUCAGON EMERGENCY) 1 MG injection Inject 1 mg into the muscle once as needed (in case of severe hypoglycemia). 1 each 12  . Insulin Degludec (TRESIBA FLEXTOUCH) 100 UNIT/ML SOPN Inject 12 Units into the skin daily before breakfast. 5 pen 3  . Insulin Pen Needle (FIFTY50 PEN NEEDLES) 32G X 4 MM MISC Use 4x a day 200 each  11  . insulin regular (NOVOLIN R) 100 units/mL injection Inject 0.06 mLs (6 Units total) into the skin 3 (three) times daily before meals. Based on sliding scale and carbs. 10 mL 2  . Multiple Vitamin (MULTIVITAMIN) tablet Take 1 tablet by mouth daily.      . pantoprazole (PROTONIX) 40 MG tablet Take 40 mg twice daily, (1 tablet 30 minutes before breakfast and 1 tablet 30 minutes before dinner) for 4 weeks, then take 1 tablet daily after that (1 tablet 30 minutes before breakfast) 90 tablet 1  . pravastatin (PRAVACHOL) 40 MG tablet Take 1 tablet (40 mg total) by mouth daily. 90 tablet 3  . Probiotic Product (PHILLIPS COLON HEALTH PO) Take 1 capsule by mouth daily.    . quinapril (ACCUPRIL) 5 MG tablet TAKE 1 TABLET (5 MG TOTAL) BY MOUTH DAILY. 90 tablet 3   Current Facility-Administered Medications on File Prior to Visit  Medication Dose Route Frequency Provider Last Rate Last Dose  . 0.9 %  sodium chloride infusion  500 mL Intravenous Continuous Jerene Bears, MD       Allergies  Allergen Reactions  . Toujeo Solostar [Insulin Glargine] Shortness Of Breath  . Lactose Intolerance (Gi)   . Magnesium-Containing Compounds Swelling   Family History  Problem Relation Age of Onset  . Diabetes Maternal Grandmother   . Heart failure Maternal Grandmother   . COPD Maternal Grandmother     smoker  . Thyroid disease Maternal Grandmother   . Vascular Disease Maternal Grandmother   . Colon cancer Maternal Grandfather   . Colon polyps Mother   . Esophageal cancer Neg Hx   . Rectal cancer Neg  Hx   . Stomach cancer Neg Hx    PE: BP 126/84   Pulse 74   Ht 5' 3.5" (1.613 m)   Wt 132 lb (59.9 kg)   BMI 23.02 kg/m  Body mass index is 23.02 kg/m.  Wt Readings from Last 3 Encounters:  07/30/16 132 lb (59.9 kg)  07/11/16 132 lb (59.9 kg)  06/13/16 132 lb (59.9 kg)   Constitutional: normal weight, in NAD Eyes: PERRLA, EOMI, no exophthalmos ENT: moist mucous membranes, no thyromegaly, no cervical lymphadenopathy Cardiovascular: RRR, No MRG Respiratory: CTA B Gastrointestinal: abdomen soft, NT, ND, BS+ Musculoskeletal: no deformities, strength intact in all 4 Skin: moist, warm, no rashes Neurological: no tremor with outstretched hands, DTR normal in all 4  ASSESSMENT: 1. DM1, uncontrolled, with complications - Mild DR - Gastroparesis - This is the reason why she is on regular insulin, and she is taking it 10 minutes  rather than 30 minutes before eating  She had low CBGs with Novolog!  2. HL  PLAN:  1. Patient with long-standing, uncontrolled DM1, on insulin therapy. Her sugars were worse at last visit as she was in vacation before, but are better now. At last visit, we switched from Lantus to [place] as this has lower propensity for lows. She feels this helped. - Sugars can be higher in am, I believe as she overtreats lower sugars at bedtime >> advised to reduce apple juice then. Also, sugars can drop at night if she corrects sugars <300 at night >> again advised against this. - I suggested to:  Patient Instructions  Please continue Tresiba 12 units in am.  Please continue: - R insulin: - insulin to carb ratio (ICR)   B'fast: 20:1  Lunch: 20:1   Dinner: 20:1  - target 140  - insulin sensitivity factor (ISF)  60: 140-200: + 1 unit 201-260: + 2 units 261-320: + 3 units 321-380: + 4 units >380: + 5 units  Do not correct bedtime sugars <300, and only inject 1 unit then.   Drink only 4 oz of apple juice if sugars lower at bedtime.  Please return in 3 months  with your sugar log.   Please stop at the lab.  - Continue checking sugars at different times of the day - check at least 4 times a day, rotating checks. She is doing a great job with this. - advised for yearly eye exams >> had one in 10/2015 - Checked hemoglobin A1c today >> 7.1% (better!) - no signs of other autoimmune disorders, reviewed most recent TSH, which was normal - Return to clinic in 3 mo with sugar log.   2. HL - On Pravastatin 40 (started at last visit, still has leg cramps) + CoQ10 - will recheck lipids today  Office Visit on 07/30/2016  Component Date Value Ref Range Status  . Cholesterol 07/30/2016 159  0 - 200 mg/dL Final  . Triglycerides 07/30/2016 73.0  0.0 - 149.0 mg/dL Final  . HDL 07/30/2016 63.00  >39.00 mg/dL Final  . VLDL 07/30/2016 14.6  0.0 - 40.0 mg/dL Final  . LDL Cholesterol 07/30/2016 82  0 - 99 mg/dL Final  . Total CHOL/HDL Ratio 07/30/2016 3   Final  . NonHDL 07/30/2016 96.32   Final  . Hemoglobin A1C 07/30/2016 7.1   Final   Labs are all [facility] better!  Philemon Kingdom, MD PhD [facility]

## 2016-08-03 DIAGNOSIS — Z23 Encounter for immunization: Secondary | ICD-10-CM | POA: Diagnosis not present

## 2016-08-06 ENCOUNTER — Ambulatory Visit: Payer: BLUE CROSS/BLUE SHIELD | Admitting: Internal Medicine

## 2016-08-19 ENCOUNTER — Encounter: Payer: BLUE CROSS/BLUE SHIELD | Admitting: Internal Medicine

## 2016-09-05 DIAGNOSIS — N760 Acute vaginitis: Secondary | ICD-10-CM | POA: Diagnosis not present

## 2016-09-05 DIAGNOSIS — L298 Other pruritus: Secondary | ICD-10-CM | POA: Diagnosis not present

## 2016-09-17 ENCOUNTER — Other Ambulatory Visit: Payer: Self-pay

## 2016-09-17 MED ORDER — INSULIN REGULAR HUMAN 100 UNIT/ML IJ SOLN: INTRAMUSCULAR | 1 refills | Status: DC

## 2016-10-02 DIAGNOSIS — B373 Candidiasis of vulva and vagina: Secondary | ICD-10-CM | POA: Diagnosis not present

## 2016-10-08 ENCOUNTER — Ambulatory Visit: Payer: BLUE CROSS/BLUE SHIELD | Admitting: Internal Medicine

## 2016-10-29 ENCOUNTER — Encounter: Payer: Self-pay | Admitting: Internal Medicine

## 2016-10-29 ENCOUNTER — Ambulatory Visit (INDEPENDENT_AMBULATORY_CARE_PROVIDER_SITE_OTHER): Payer: BLUE CROSS/BLUE SHIELD | Admitting: Internal Medicine

## 2016-10-29 VITALS — BP 122/76 | HR 69 | Resp 18 | Wt 132.0 lb

## 2016-10-29 DIAGNOSIS — E103299 Type 1 diabetes mellitus with mild nonproliferative diabetic retinopathy without macular edema, unspecified eye: Secondary | ICD-10-CM

## 2016-10-29 LAB — POCT GLYCOSYLATED HEMOGLOBIN (HGB A1C): Hemoglobin A1C: 7.5

## 2016-10-29 MED ORDER — INSULIN DEGLUDEC 100 UNIT/ML ~~LOC~~ SOPN: 9.0000 [IU] | PEN_INJECTOR | Freq: Every day | SUBCUTANEOUS | 3 refills | Status: DC

## 2016-10-29 NOTE — Progress Notes (Signed)
Patient ID: HYE TRAWICK, female   DOB: 1960-11-27, 55 y.o.   MRN: [id number]  HPI: Felicia [name] is a 55 y.o.-year-old female, returning for follow-up for DM1, dx in 35 (age 20), uncontrolled, with complications (mild retinopathy, gastroparesis). Last visit 3 months ago.  She was on a pump x 5 years (~2010), but did not like it. Sugars were not better when she used the pump. Not interested to get back on it.   Last hemoglobin A1c was: Lab Results  Component Value Date   HGBA1C 7.1 07/30/2016   HGBA1C 7.9 05/06/2016   HGBA1C 7.3 12/28/2015   She was on: - Lantus 6 units in am and 6 units in hs >> Tresiba 12 units - Regular insulin 10 min before the meal - ICR B: 30:1, L: 20:1, D: 20:1; target 150, ISF 60 >> 2-3 units per meal Tried Toujeo >> allergy: CP, SOB.  Now on: - Tresiba 10 units in am (was having lows on 12 units) - R insulin (Novolin): - insulin to carb ratio (ICR)   B'fast: 20:1   Lunch: 20:1   Dinner: 20:1  - target 140 - insulin sensitivity factor (ISF) 60  140-200: + 1 unit 201-260: + 2 units 261-320: + 3 units 321-380: + 4 units >380: + 5 units Do not correct bedtime sugars <300, and only inject 1 unit then.   Meter: ReliOn, had an AccuChek  Pt checks her sugars 4-5 a day and they are - per log: - am: 66-148, 258, 310 >> 49, 81-180, 240, 265 >> 119-228, 337 >> 75, 85-168, 276 - 2h after b'fast: 71, 228 >> n/c >> 182, 267 >> 235 (corrected a low) >> 77 >> 153 - before lunch: 64-227, 248 >> 84, 104, 197-284,335 >> 61-255, 294 >> 97-174 >> 92-177, 202 - 2h after lunch: 166, 222 >> 226 >> n/c - before dinner: 74, 76-235, 306 >> 70-140, 214, 328 >> 68-290, 316 x1 >> 96-208, 289 >> 78-251, 290, 343 (no insulin with lunch) - 2h after dinner: 187, 286 >> 243 >> n/c >> 173 >> n/c >> 183 - bedtime: 86-252, 297 >> 82, 92-339 >> 56, 72-307, 325 >> 135-298 >> 59, 67-184, 240 >> 87-162 - nighttime: 3 am: 51 >> no checks >> 52 >> 50 >> 41-67 Lowest sugar was 51  x1, 66 x1 >> 52x1 >> 49x1 >> 50's - not as many ; she has hypoglycemia awareness at 70s. No recent (last 10 years) hypoglycemia admission. Does have a glucagon kit at home. Highest sugar was 300s. No recent DKA admissions.    Pt's meals are: - Breakfast: protein bar + fruit - 10 am snack  - Lunch: apple + cheese, carrots - Snack bar: 15g carbs - Glucerna - Dinner: meat + 1/2 backed potato + veggies/salad - Snacks: popcorn, salty snacks: potato chips  - ? mild CKD, last BUN/creatinine:  Lab Results  Component Value Date   BUN 19 03/04/2016   CREATININE 0.91 03/04/2016  + Quinapril 10 >> 5 mg daily. - last set of lipids: Lab Results  Component Value Date   CHOL 159 07/30/2016   HDL 63.00 07/30/2016   LDLCALC 82 07/30/2016   TRIG 73.0 07/30/2016   CHOLHDL 3 07/30/2016  She was on Red yeast rice + CoQ10. In 04/2016, we started Pravastatin. Had nausea from Mg. Tried Crestor and Zetia >> leg cramps. The Pravastatin also causes leg cramps, but she is now using a foam (? Ingredients) >> cramps more bearable. -  last eye exam was in 09/2016. + mild DR.  - no numbness and tingling in her feet.  Last TSH: Lab Results  Component Value Date   TSH 2.06 03/04/2016   She also has hypertension and hyperlipidemia.  ROS: Constitutional: no weight gain/loss, no fatigue, no subjective hyperthermia/hypothermia Eyes: no blurry vision, no xerophthalmia ENT: no sore throat, no nodules palpated in throat, no dysphagia/odynophagia, no hoarseness Cardiovascular: no CP/SOB/palpitations/leg swelling Respiratory: no cough/SOB Gastrointestinal: no N/V/D/C Musculoskeletal: no muscle aches/no joint aches, + mm cramps Skin: no rashes Neurological: no tremors/numbness/tingling/dizziness  I reviewed pt's medications, allergies, PMH, social hx, family hx, and changes were documented in the history of present illness. Otherwise, unchanged from my initial visit note, except she started Protonix.  Past  Medical History:  Diagnosis Date  . Allergy   . Anemia    past history long ago  . Disorder of kidney    has abd kidney (right)  . GERD (gastroesophageal reflux disease)   . History of diabetic gastroparesis   . HLD (hyperlipidemia)    borderline  . HTN (hypertension)    borderline  . Hx: UTI (urinary tract infection)   . Hypertension   . IBS (irritable bowel syndrome)   . Nephrolithiasis 2005  . Osteopenia 2012   per pt  . Systolic murmur 03/532   mild mitral insuff  . Type 1 diabetes mellitus with diabetic retinopathy without macular edema (HCC)    dx at 55 y/o, background retinopathy   Past Surgical History:  Procedure Laterality Date  . CESAREAN SECTION  1985  . COLONOSCOPY  07/2016   WNL (Pyrtle)  . ESOPHAGOGASTRODUODENOSCOPY  07/2016   LA Grade C reflux esophagitis, concern for stasis/dysmotility (Pyrtle)  . LAPAROSCOPIC APPENDECTOMY N/A 09/01/2015   Procedure: APPENDECTOMY LAPAROSCOPIC;  Surgeon: Judeth Horn, MD;  Location: [facility];  Service: General;  Laterality: N/A;  . ORIF FEMUR FRACTURE Left 1996   Corrected by patient in history 2016  . PARTIAL HYSTERECTOMY  2000   paps by [facility]  . TONSILLECTOMY AND ADENOIDECTOMY  1975  . TRIGGER FINGER RELEASE Left 12/2010   thumb   Social History   Social History  . Marital status: Married    Spouse name: N/A  . Number of children: 1  . Years of education: N/A   Occupational History  . HR director Other    [place] History Main Topics  . Smoking status: Never Smoker  . Smokeless tobacco: Never Used  . Alcohol use No  . Drug use: No  . Sexual activity: Not on file   Other Topics Concern  . 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 aspirin 81 MG tablet Take 81 mg by mouth  daily.      . BD INSULIN SYRINGE ULTRAFINE 31G X 15/64" 0.3 ML MISC USE TO INJECT INSULIN 6 TIMES PER DAY 200 each 1  . Coenzyme Q10 100 MG capsule Take 1 capsule (100 mg total) by mouth daily.    Marland Kitchen glucagon (GLUCAGON EMERGENCY) 1 MG injection Inject 1 mg into the muscle once as needed (in case of severe hypoglycemia). 1 each 12  . Insulin Degludec (TRESIBA FLEXTOUCH) 100 UNIT/ML SOPN Inject 12 Units into the  skin daily before breakfast. 5 pen 3  . Insulin Pen Needle (FIFTY50 PEN NEEDLES) 32G X 4 MM MISC Use 4x a day 200 each 11  . insulin regular (NOVOLIN R) 250 units/2.68m (100 units/mL) injection Inject 0.06 mLs (6 Units total) into the skin 3 (three) times daily before meals. Based on sliding scale and carbs. 20 mL 1  . Multiple Vitamin (MULTIVITAMIN) tablet Take 1 tablet by mouth daily.      . pantoprazole (PROTONIX) 40 MG tablet Take 40 mg twice daily, (1 tablet 30 minutes before breakfast and 1 tablet 30 minutes before dinner) for 4 weeks, then take 1 tablet daily after that (1 tablet 30 minutes before breakfast) 90 tablet 1  . pravastatin (PRAVACHOL) 40 MG tablet Take 1 tablet (40 mg total) by mouth daily. 90 tablet 3  . Probiotic Product (PHILLIPS COLON HEALTH PO) Take 1 capsule by mouth daily.    . quinapril (ACCUPRIL) 5 MG tablet TAKE 1 TABLET (5 MG TOTAL) BY MOUTH DAILY. 90 tablet 3   Current Facility-Administered Medications on File Prior to Visit  Medication Dose Route Frequency Provider Last Rate Last Dose  . 0.9 %  sodium chloride infusion  500 mL Intravenous Continuous JJerene Bears MD       Allergies  Allergen Reactions  . Toujeo Solostar [Insulin Glargine] Shortness Of Breath  . Lactose Intolerance (Gi)   . Magnesium-Containing Compounds Swelling   Family History  Problem Relation Age of Onset  . Diabetes Maternal Grandmother   . Heart failure Maternal Grandmother   . COPD Maternal Grandmother     smoker  . Thyroid disease Maternal Grandmother   . Vascular Disease  Maternal Grandmother   . Colon cancer Maternal Grandfather   . Colon polyps Mother   . Esophageal cancer Neg Hx   . Rectal cancer Neg Hx   . Stomach cancer Neg Hx    PE: BP 122/76   Pulse 69   Resp 18   Wt 132 lb (59.9 kg)   SpO2 96%   BMI 23.02 kg/m  Body mass index is 23.02 kg/m.  Wt Readings from Last 3 Encounters:  10/29/16 132 lb (59.9 kg)  07/30/16 132 lb (59.9 kg)  07/11/16 132 lb (59.9 kg)   Constitutional: normal weight, in NAD Eyes: PERRLA, EOMI, no exophthalmos ENT: moist mucous membranes, no thyromegaly, no cervical lymphadenopathy Cardiovascular: RRR, No MRG Respiratory: CTA B Gastrointestinal: abdomen soft, NT, ND, BS+ Musculoskeletal: no deformities, strength intact in all 4 Skin: moist, warm, no rashes Neurological: no tremor with outstretched hands, DTR normal in all 4  ASSESSMENT: 1. DM1, uncontrolled, with complications - Mild DR - Gastroparesis - This is the reason why she is on regular insulin, and she is taking it 10 minutes  rather than 30 minutes before eating  She had low CBGs with Novolog!  2. HL  PLAN:  1. Patient with long-standing, uncontrolled DM1, on insulin therapy. Her sugars are a little better at this visit. We switched from Lantus to [place] and Barbudathis summer as this has lower propensity for lows. She also backed off the [place] and Barbudadose as she was having lows >> this helped but she started again to have lows 2 weeks ago (at night). She already takes T[place] and Barbudain am >> will decrease dose a little. 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 - I suggested to:  Patient Instructions  Please decrease Tresiba to 9 units in  am (if lows at night persist, you may need 8 units).  Please continue: - R insulin: - insulin to carb ratio (ICR)   B'fast: 20:1  Lunch: 20:1   Dinner: 20:1  - target 140  - insulin sensitivity factor (ISF) 60: 140-200: + 1 unit 201-260: + 2 units 261-320:  + 3 units 321-380: + 4 units >380: + 5 units  Do not correct bedtime sugars <300, and only inject 1 unit then.   Please return in 3 months with your sugar log.   - Continue checking sugars at different times of the day - check at least 4 times a day, rotating checks.  - advised for yearly eye exams >> had one in 09/2016  - Checked hemoglobin A1c today >> 7.5% (a little higher) - no signs of other autoimmune disorders, reviewed most recent TSH, which was normal - Return to clinic in 3 mo with sugar log.   2. HL - On Pravastatin 40 (started at last visit, still has leg cramps) + CoQ10 - last lipid panel was great   Philemon Kingdom, MD PhD [facility]

## 2016-10-29 NOTE — Patient Instructions (Signed)
Please decrease Tresiba to 9 units in am (if lows at night persist, you may need 8 units).  Please continue: - R insulin: - insulin to carb ratio (ICR)   B'fast: 20:1  Lunch: 20:1   Dinner: 20:1  - target 140  - insulin sensitivity factor (ISF) 60: 140-200: + 1 unit 201-260: + 2 units 261-320: + 3 units 321-380: + 4 units >380: + 5 units  Do not correct bedtime sugars <300, and only inject 1 unit then.   Please return in 3 months with your sugar log.

## 2016-11-26 DIAGNOSIS — M1611 Unilateral primary osteoarthritis, right hip: Secondary | ICD-10-CM | POA: Diagnosis not present

## 2016-12-08 DIAGNOSIS — J069 Acute upper respiratory infection, unspecified: Secondary | ICD-10-CM | POA: Diagnosis not present

## 2016-12-27 DIAGNOSIS — M7061 Trochanteric bursitis, right hip: Secondary | ICD-10-CM | POA: Diagnosis not present

## 2016-12-27 DIAGNOSIS — M5431 Sciatica, right side: Secondary | ICD-10-CM | POA: Diagnosis not present

## 2016-12-27 DIAGNOSIS — M1611 Unilateral primary osteoarthritis, right hip: Secondary | ICD-10-CM | POA: Diagnosis not present

## 2017-01-04 DIAGNOSIS — M1611 Unilateral primary osteoarthritis, right hip: Secondary | ICD-10-CM | POA: Diagnosis not present

## 2017-01-14 DIAGNOSIS — S76311A Strain of muscle, fascia and tendon of the posterior muscle group at thigh level, right thigh, initial encounter: Secondary | ICD-10-CM | POA: Diagnosis not present

## 2017-01-14 DIAGNOSIS — M7631 Iliotibial band syndrome, right leg: Secondary | ICD-10-CM | POA: Diagnosis not present

## 2017-01-24 DIAGNOSIS — S76311D Strain of muscle, fascia and tendon of the posterior muscle group at thigh level, right thigh, subsequent encounter: Secondary | ICD-10-CM | POA: Diagnosis not present

## 2017-01-29 ENCOUNTER — Ambulatory Visit: Payer: BLUE CROSS/BLUE SHIELD | Admitting: Internal Medicine

## 2017-02-10 DIAGNOSIS — S76311D Strain of muscle, fascia and tendon of the posterior muscle group at thigh level, right thigh, subsequent encounter: Secondary | ICD-10-CM | POA: Diagnosis not present

## 2017-02-25 IMAGING — MG MM DIGITAL SCREENING BILAT W/ CAD
4 series · 4 of 4 positions shown · non-contrast
Comparison: Previous exam(s).

CLINICAL DATA: Screening.

EXAM:
DIGITAL SCREENING BILATERAL MAMMOGRAM WITH CAD

[L MLO]
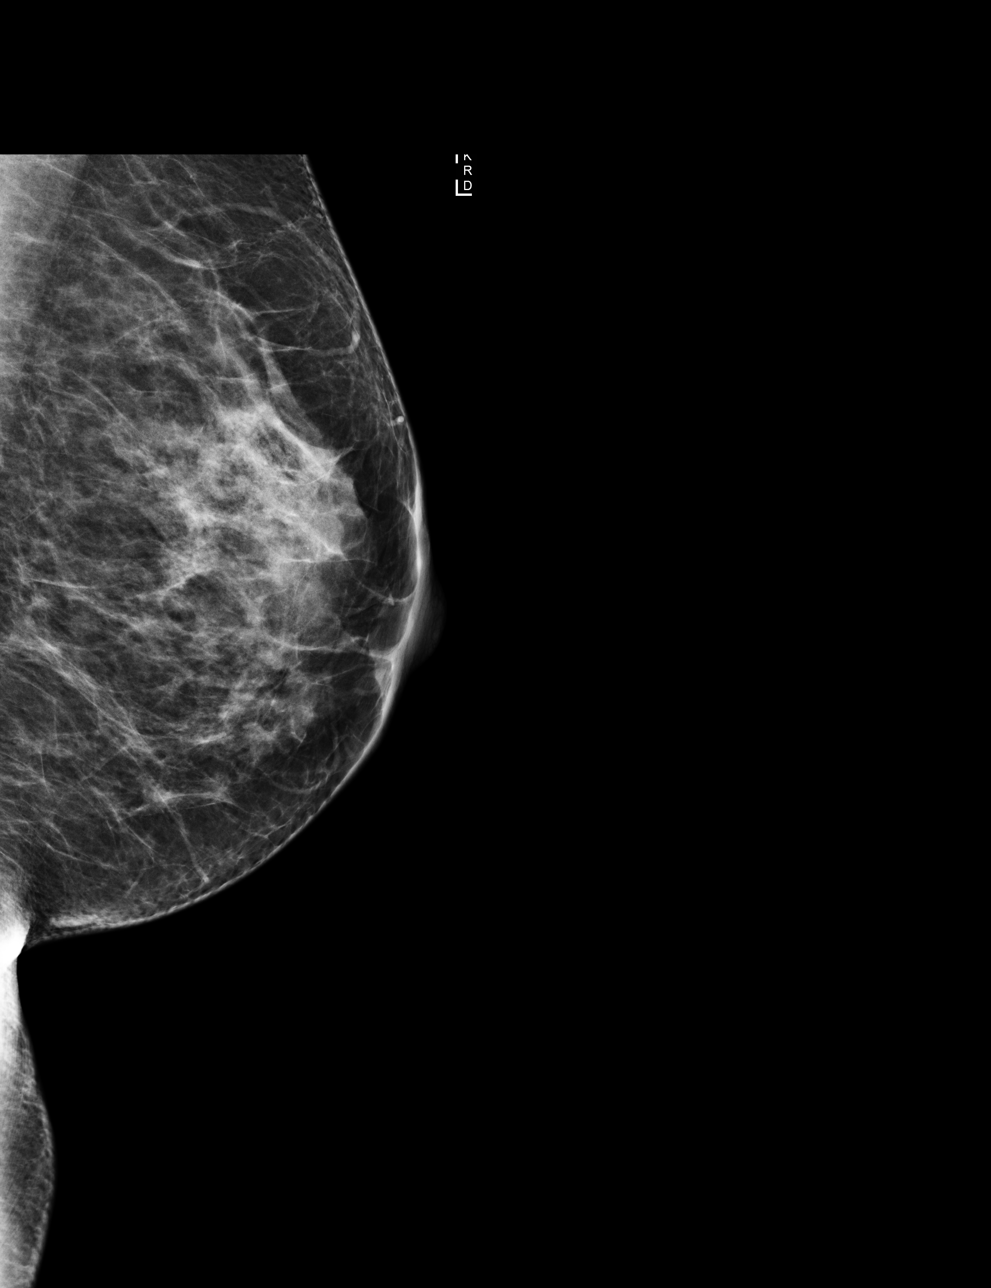

[L CC]
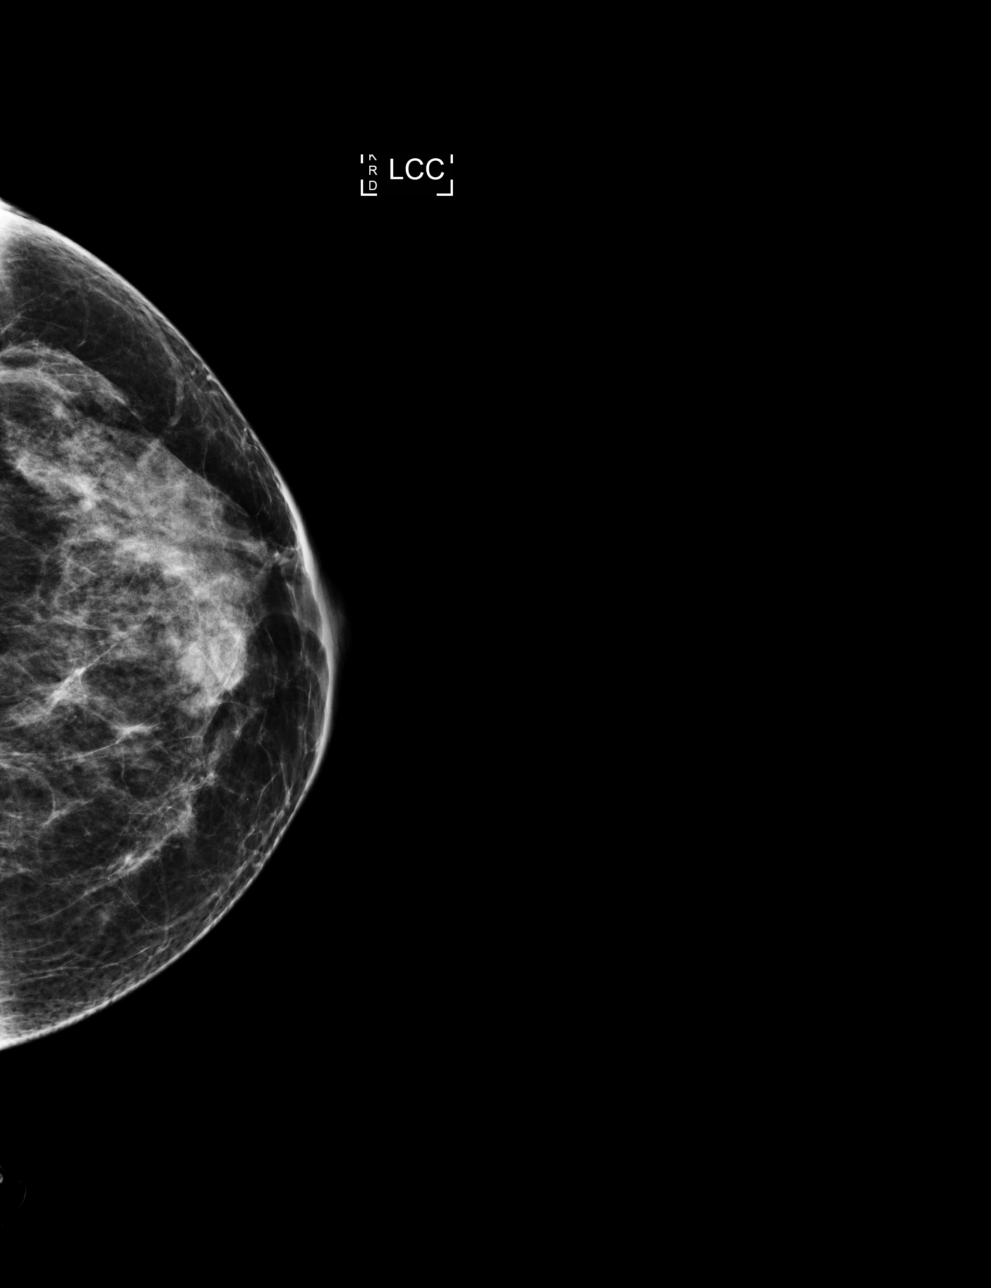

[R MLO]
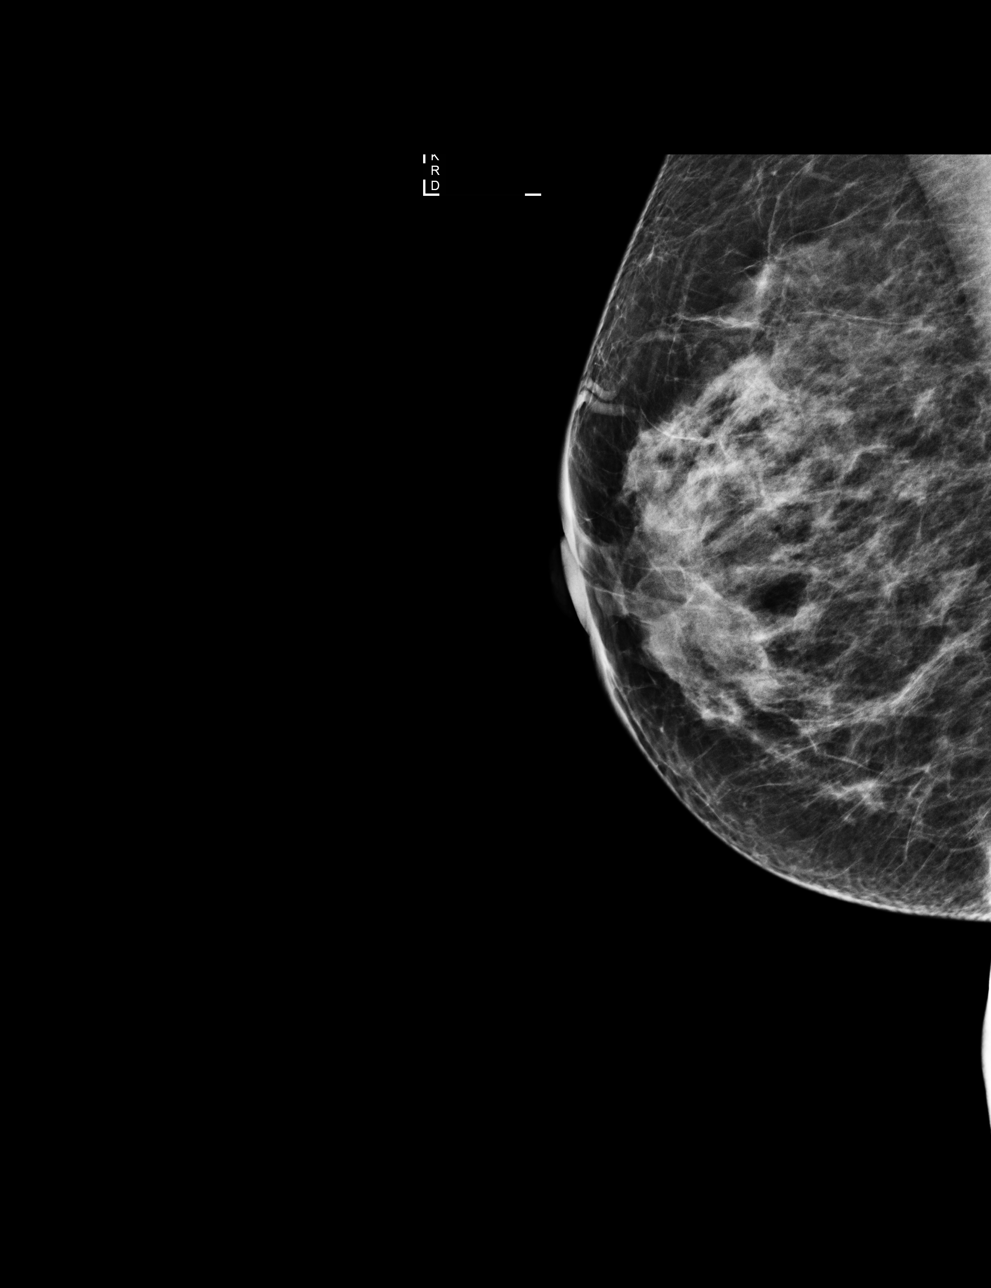

[R CC]
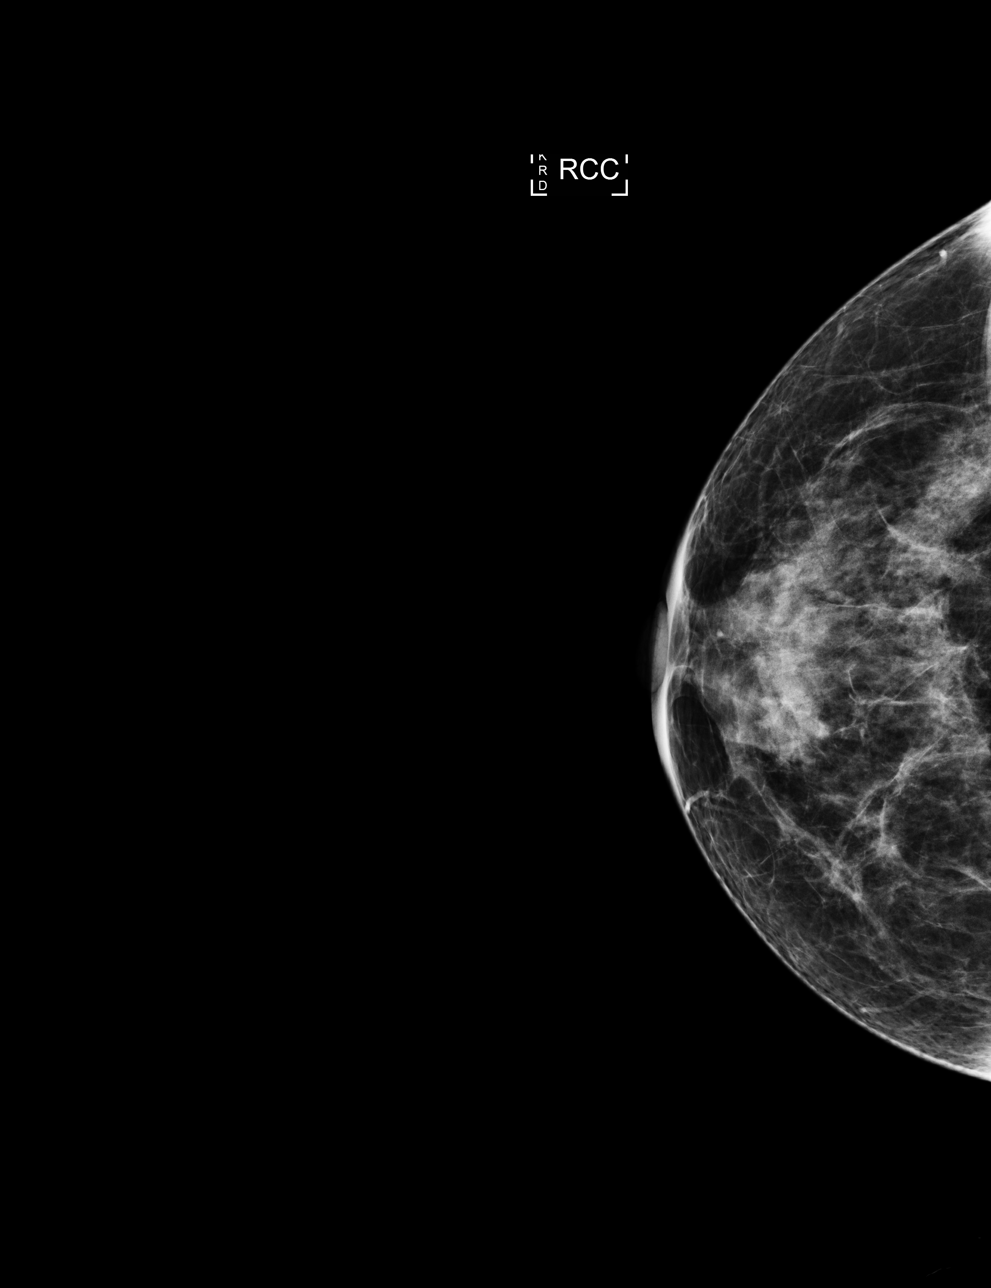

[4 of 4 positions shown; findings below may reference images not displayed]

ACR Breast Density Category c: The breast tissue is heterogeneously
dense, which may obscure small masses.
FINDINGS: There are no findings suspicious for malignancy. Images were
processed with CAD.
IMPRESSION: No mammographic evidence of malignancy. A result letter of this
screening mammogram will be mailed directly to the patient.

RECOMMENDATION:
Screening mammogram in one year. (Code:[id number]-[id number])

BI-RADS CATEGORY  1: Negative.

## 2017-03-01 DIAGNOSIS — R197 Diarrhea, unspecified: Secondary | ICD-10-CM | POA: Diagnosis not present

## 2017-03-01 DIAGNOSIS — R110 Nausea: Secondary | ICD-10-CM | POA: Diagnosis not present

## 2017-03-01 DIAGNOSIS — R1083 Colic: Secondary | ICD-10-CM | POA: Diagnosis not present

## 2017-03-19 ENCOUNTER — Encounter: Payer: Self-pay | Admitting: Family Medicine

## 2017-03-19 ENCOUNTER — Ambulatory Visit (INDEPENDENT_AMBULATORY_CARE_PROVIDER_SITE_OTHER): Payer: BLUE CROSS/BLUE SHIELD | Admitting: Family Medicine

## 2017-03-19 VITALS — BP 118/68 | HR 78 | Temp 97.7°F | Wt 128.5 lb

## 2017-03-19 DIAGNOSIS — R197 Diarrhea, unspecified: Secondary | ICD-10-CM

## 2017-03-19 NOTE — Progress Notes (Signed)
Was having diarrhea for days, frequently, with a lot of cramping.  No blood in stool. She notes a bad odor with burping.  She went to UC.  Diarrhea got some better for a few days after taking bentyl and lomotil.  She would have a few days w/o BM but then the sx would return a few days later.    No fevers.  Still no blood in stool.  Sugar has been ~170 recently.  She has had DM1 for 42 years.    No atypical travel or foods.  No mucous in stools.  Variable amounts of abd cramping.  Still with "gurgling" in the abdomen.  Bland diet.  No vomiting.  No nausea.  Stools are not greasy.  Fecal urgency with BMs/diarrhea.  No dysuria.    She is lactose intolerant but she avoids lactose.    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    Meds, vitals, and allergies reviewed.   ROS: Per HPI unless specifically indicated in ROS section   GEN: nad, alert and oriented HEENT: mucous membranes moist NECK: supple w/o LA CV: rrr.  no murmur PULM: ctab, no inc wob ABD: soft, +bs, lower abd ttp but no rebound.  EXT: no edema SKIN: no acute rash

## 2017-03-19 NOTE — Patient Instructions (Signed)
Go to the lab on the way out.  We'll contact you with your lab report. Don't change your meds for now.  Take care.  Glad to see you.  

## 2017-03-20 ENCOUNTER — Other Ambulatory Visit: Payer: Self-pay | Admitting: Internal Medicine

## 2017-03-20 ENCOUNTER — Other Ambulatory Visit: Payer: Self-pay | Admitting: Family Medicine

## 2017-03-20 DIAGNOSIS — R197 Diarrhea, unspecified: Secondary | ICD-10-CM | POA: Diagnosis not present

## 2017-03-20 LAB — COMPREHENSIVE METABOLIC PANEL
ALT: 16 U/L (ref 0–35)
AST: 18 U/L (ref 0–37)
Albumin: 4.2 g/dL (ref 3.5–5.2)
Alkaline Phosphatase: 64 U/L (ref 39–117)
BUN: 21 mg/dL (ref 6–23)
CO2: 32 mEq/L (ref 19–32)
Calcium: 9.5 mg/dL (ref 8.4–10.5)
Chloride: 101 mEq/L (ref 96–112)
Creatinine, Ser: 0.99 mg/dL (ref 0.40–1.20)
GFR: 62.11 mL/min (ref >60.00)
Glucose, Bld: 353 mg/dL — ABNORMAL HIGH (ref 70–99)
Potassium: 5 mEq/L (ref 3.5–5.1)
Sodium: 136 mEq/L (ref 135–145)
Total Bilirubin: 0.3 mg/dL (ref 0.2–1.2)
Total Protein: 7.1 g/dL (ref 6.0–8.3)

## 2017-03-20 LAB — CBC WITH DIFFERENTIAL/PLATELET
Basophils Absolute: 0.1 10*3/uL (ref 0.0–0.1)
Basophils Relative: 1.3 % (ref 0.0–3.0)
Eosinophils Absolute: 0.3 10*3/uL (ref 0.0–0.7)
Eosinophils Relative: 6.2 % — ABNORMAL HIGH (ref 0.0–5.0)
HCT: 36 % (ref 36.0–46.0)
Hemoglobin: 12.1 g/dL (ref 12.0–15.0)
Lymphocytes Relative: 31.6 % (ref 12.0–46.0)
Lymphs Abs: 1.6 10*3/uL (ref 0.7–4.0)
MCHC: 33.6 g/dL (ref 30.0–36.0)
MCV: 88.6 fl (ref 78.0–100.0)
Monocytes Absolute: 0.5 10*3/uL (ref 0.1–1.0)
Monocytes Relative: 9.8 % (ref 3.0–12.0)
Neutro Abs: 2.7 10*3/uL (ref 1.4–7.7)
Neutrophils Relative %: 51.1 % (ref 43.0–77.0)
Platelets: 261 10*3/uL (ref 150.0–400.0)
RBC: 4.06 Mil/uL (ref 3.87–5.11)
RDW: 13.4 % (ref 11.5–15.5)
WBC: 5.2 10*3/uL (ref 4.0–10.5)

## 2017-03-20 LAB — LIPASE: Lipase: 25 U/L (ref 11.0–59.0)

## 2017-03-20 NOTE — Assessment & Plan Note (Signed)
Unclear source. She does not have typical diabetic gastroparesis symptoms. She's not vomiting. Reasonable to check routine labs today. Nontoxic. Still okay for outpatient follow-up. Still okay to use when necessary Lomotil and Bentyl.  She agrees with plan.

## 2017-03-21 LAB — HELICOBACTER PYLORI  SPECIAL ANTIGEN: H. PYLORI Antigen: NOT DETECTED

## 2017-03-21 LAB — C. DIFFICILE GDH AND TOXIN A/B
C. difficile GDH: NOT DETECTED
C. difficile Toxin A/B: NOT DETECTED

## 2017-03-24 LAB — STOOL CULTURE

## 2017-03-26 ENCOUNTER — Telehealth: Payer: Self-pay | Admitting: Family Medicine

## 2017-03-26 DIAGNOSIS — R197 Diarrhea, unspecified: Secondary | ICD-10-CM

## 2017-03-26 NOTE — Telephone Encounter (Signed)
Patient notified as instructed by telephone and verbalized understanding. Patient stated that she sees Dr. Rhea BeltonPyrtle as her GI doctor already. Patient stated that she had an appointment scheduled and had to cancel it because of a new job. Patient stated that she is trying to get the appointment rescheduled, but has been told to call back the end of May and GI schedules will be available for June and July.

## 2017-03-26 NOTE — Telephone Encounter (Signed)
I think we should get her over to see GI and continue her current meds prn in the meantime.  I put in the referral.  Thanks.

## 2017-03-26 NOTE — Telephone Encounter (Signed)
Patient notified as instructed by telephone and verbalized understanding. Patient stated that she appreciated the referral, but because of her new job needs to work out the appointment herself around her vacation time. Patient stated that she plans on calling GI the end of May and work a time out.

## 2017-03-26 NOTE — Telephone Encounter (Signed)
Patient returned Felicia [name]'s call about her lab results.  Patient said she still has diarrhea.  It stops for a couple of days and comes back and cramping. Patient has been taking the medication to stop the diarrhea and then after a couple of days it comes back.

## 2017-03-26 NOTE — Telephone Encounter (Signed)
Noted, thanks.  I will ask Shirlee LimerickMarion to cancel the referral.

## 2017-03-26 NOTE — Telephone Encounter (Signed)
Thank you for the input. I was aware that she had seen GI previously, I was hoping that the referral might be able to get her in quicker.

## 2017-04-08 ENCOUNTER — Ambulatory Visit (INDEPENDENT_AMBULATORY_CARE_PROVIDER_SITE_OTHER): Payer: BLUE CROSS/BLUE SHIELD | Admitting: Internal Medicine

## 2017-04-08 ENCOUNTER — Encounter: Payer: Self-pay | Admitting: Internal Medicine

## 2017-04-08 VITALS — BP 128/72 | HR 83 | Ht 64.0 in | Wt 128.0 lb

## 2017-04-08 DIAGNOSIS — E103299 Type 1 diabetes mellitus with mild nonproliferative diabetic retinopathy without macular edema, unspecified eye: Secondary | ICD-10-CM

## 2017-04-08 DIAGNOSIS — E785 Hyperlipidemia, unspecified: Secondary | ICD-10-CM

## 2017-04-08 DIAGNOSIS — Z794 Long term (current) use of insulin: Secondary | ICD-10-CM | POA: Diagnosis not present

## 2017-04-08 LAB — POCT GLYCOSYLATED HEMOGLOBIN (HGB A1C): Hemoglobin A1C: 7.5

## 2017-04-08 NOTE — Patient Instructions (Addendum)
Please try to split Tresiba to 6 units in am and 4 units at bedtime.  Please continue: - R insulin: - insulin to carb ratio (ICR)   B'fast: 20:1  Lunch: 20:1   Dinner: 20:1  - target 140  - insulin sensitivity factor (ISF) 60: 140-200: + 1 unit 201-260: + 2 units 261-320: + 3 units 321-380: + 4 units >380: + 5 units  Do not correct bedtime sugars <300, and only inject 1 unit then.   Please return in 4 months with your sugar log.

## 2017-04-08 NOTE — Progress Notes (Signed)
Patient ID: Felicia [name], female   DOB: 12-13-1960, 56 y.o.   MRN: [id number]  HPI: Felicia [name] is a 56 y.o.-year-old female, returning for follow-up for DM1, dx in 9 (age 56), uncontrolled, with complications (mild retinopathy, gastroparesis). Last visit 3 mo ago.  She was on a pump x 5 years (~2010), but did not like it. Sugars were not better when she used the pump. Not interested to get back on the pump.   Last hemoglobin A1c was: Lab Results  Component Value Date   HGBA1C 7.5 10/29/2016   HGBA1C 7.1 07/30/2016   HGBA1C 7.9 05/06/2016   She was on: - Lantus 6 units in am and 6 units in hs >> Tresiba 12 units - Regular insulin 10 min before the meal - ICR B: 30:1, L: 20:1, D: 20:1; target 150, ISF 60 >> 2-3 units per meal Tried Toujeo >> allergy: CP, SOB.  Now on: - Tresiba 10 units in am (was having lows on 12 units). At last visit, I advised her to decrease further, but she kept the dose at 10 units. - R insulin (Novolin): - insulin to carb ratio (ICR)   B'fast: 20:1   Lunch: 20:1   Dinner: 20:1  - target 140 - insulin sensitivity factor (ISF) 60  140-200: + 1 unit 201-260: + 2 units 261-320: + 3 units 321-380: + 4 units >380: + 5 units Do not correct bedtime sugars <300, and only inject 1 unit then.   Meter: ReliOn, had an AccuChek  Pt checks her sugars 4-5x a day: - am: 49, 81-180, 240, 265 >> 119-228, 337 >> 75, 85-168, 276 >> 57, 115-221 - 2h after b'fast:  235 (corrected a low) >> 77 >> 153 >> n/c - before lunch:61-255, 294 >> 97-174 >> 92-177, 202 >>  67, 72, 102-285, 318 - 2h after lunch: 166, 222 >> 226 >> n/c - before dinner:  96-208, 289 >> 78-251, 290, 343 (no insulin with lunch) >> 62-256, 309 - 2h after dinner: 187, 286 >> 243 >> n/c >> 173 >> n/c >> 183 >> n/c - bedtime:56, 72-307, 325 >> 135-298 >> 59, 67-184, 240 >> 87-162 >> 94-195, 332 - nighttime: 3 am: 51 >> no checks >> 52 >> 50 >> 41-67 >> n/c Lowest sugar was 51 x1, 66 x1 >> 52x1 >>  49x1 >> 50's - not as many >> 57 ; she has hypoglycemia awareness at 70s. No recent (last 10 years) hypoglycemia admission. Does have a glucagon kit at home. Highest sugar was 300s >> 300s. No recent DKA admissions.    Pt's meals are: - Breakfast: protein bar + fruit - 10 am snack  - Lunch: apple + cheese, carrots - Snack bar: 15g carbs - Glucerna - Dinner: meat + 1/2 backed potato + veggies/salad - Snacks: popcorn, salty snacks: potato chips  - No CKD, last BUN/creatinine:  Lab Results  Component Value Date   BUN 21 03/19/2017   CREATININE 0.99 03/19/2017  On Quinapril 5 mg daily. - last set of lipids: Lab Results  Component Value Date   CHOL 159 07/30/2016   HDL 63.00 07/30/2016   LDLCALC 82 07/30/2016   TRIG 73.0 07/30/2016   CHOLHDL 3 07/30/2016  She was on Red yeast rice + CoQ10. In 04/2016, we started Pravastatin >> cramps more bearable after she started Theraworx + Muscle calm. - last eye exam was in 09/2016. + mild DR.  - No numbness and tingling in her feet.  Last TSH:  Lab Results  Component Value Date   TSH 2.06 03/04/2016   She also has hypertension and hyperlipidemia.  ROS: Constitutional: no weight gain/no weight loss, no fatigue, no subjective hyperthermia, no subjective hypothermia Eyes: no blurry vision, no xerophthalmia ENT: no sore throat, no nodules palpated in throat, no dysphagia, no odynophagia, no hoarseness Cardiovascular: no CP/no SOB/no palpitations/no leg swelling Respiratory: no cough/no SOB/no wheezing Gastrointestinal: no N/no V/+ D/no C/no acid reflux Musculoskeletal: no muscle aches/no joint aches Skin: no rashes, no hair loss Neurological: no tremors/no numbness/no tingling/no dizziness  I reviewed pt's medications, allergies, PMH, social hx, family hx, and changes were documented in the history of present illness. Otherwise, unchanged from my initial visit note.  Past Medical History:  Diagnosis Date  . Allergy   . Anemia    past  history long ago  . Disorder of kidney    has abd kidney (right)  . GERD (gastroesophageal reflux disease)   . History of diabetic gastroparesis   . HLD (hyperlipidemia)    borderline  . HTN (hypertension)    borderline  . Hx: UTI (urinary tract infection)   . Hypertension   . IBS (irritable bowel syndrome)   . Nephrolithiasis 2005  . Osteopenia 2012   per pt  . Systolic murmur 04/7671   mild mitral insuff  . Type 1 diabetes mellitus with diabetic retinopathy without macular edema (HCC)    dx at 56 y/o, background retinopathy   Past Surgical History:  Procedure Laterality Date  . CESAREAN SECTION  1985  . COLONOSCOPY  07/2016   WNL (Pyrtle)  . ESOPHAGOGASTRODUODENOSCOPY  07/2016   LA Grade C reflux esophagitis, concern for stasis/dysmotility (Pyrtle)  . LAPAROSCOPIC APPENDECTOMY N/A 09/01/2015   Procedure: APPENDECTOMY LAPAROSCOPIC;  Surgeon: Judeth Horn, MD;  Location: [facility];  Service: General;  Laterality: N/A;  . ORIF FEMUR FRACTURE Left 1996   Corrected by patient in history 2016  . PARTIAL HYSTERECTOMY  2000   paps by [facility] [place]  . TONSILLECTOMY AND ADENOIDECTOMY  1975  . TRIGGER FINGER RELEASE Left 12/2010   thumb   Social History   Social History  . Marital status: Married    Spouse name: N/A  . Number of children: 1  . Years of education: N/A   Occupational History  . HR director Other    [place] History Main Topics  . Smoking status: Never Smoker  . Smokeless tobacco: Never Used  . Alcohol use No  . Drug use: No  . Sexual activity: Not on file   Other Topics Concern  . 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 aspirin 81 MG tablet Take 81 mg by mouth daily.      . BD INSULIN SYRINGE ULTRAFINE 31G X 15/64" 0.3 ML  MISC USE TO INJECT INSULIN 6 TIMES PER DAY 200 each 1  . Coenzyme Q10 100 MG capsule Take 1 capsule (100 mg total) by mouth daily.    Marland Kitchen dicyclomine (BENTYL) 10 MG capsule Take 10 mg by mouth. Take one tablet every 6-8 hours as needed    . diphenoxylate-atropine (LOMOTIL) 2.5-0.025 MG tablet Take by mouth 4 (four) times daily as needed for diarrhea or loose stools.    Marland Kitchen  insulin degludec (TRESIBA FLEXTOUCH) 100 UNIT/ML SOPN FlexTouch Pen Inject 0.09 mLs (9 Units total) into the skin daily before breakfast. 5 pen 3  . Insulin Pen Needle (FIFTY50 PEN NEEDLES) 32G X 4 MM MISC Use 4x a day 200 each 11  . insulin regular (NOVOLIN R) 250 units/2.53m (100 units/mL) injection Inject 0.06 mLs (6 Units total) into the skin 3 (three) times daily before meals. Based on sliding scale and carbs. 20 mL 1  . Multiple Vitamin (MULTIVITAMIN) tablet Take 1 tablet by mouth daily.      .Marland KitchenNOVOLIN R 100 UNIT/ML injection INJECT 6 UNITS INTO THE SKIN 3 TIMES A DAY BEFORE MEALS BASED ON SLIDING SCALE AND CARBS 10 mL 2  . pravastatin (PRAVACHOL) 40 MG tablet Take 1 tablet (40 mg total) by mouth daily. 90 tablet 3  . quinapril (ACCUPRIL) 5 MG tablet TAKE 1 TABLET (5 MG TOTAL) BY MOUTH DAILY. 30 tablet 0  . glucagon (GLUCAGON EMERGENCY) 1 MG injection Inject 1 mg into the muscle once as needed (in case of severe hypoglycemia). (Patient not taking: Reported on 04/08/2017) 1 each 12   Current Facility-Administered Medications on File Prior to Visit  Medication Dose Route Frequency Provider Last Rate Last Dose  . 0.9 %  sodium chloride infusion  500 mL Intravenous Continuous Pyrtle, JLajuan Lines MD       Allergies  Allergen Reactions  . Toujeo Solostar [Insulin Glargine] Shortness Of Breath  . Lactose Intolerance (Gi)   . Magnesium-Containing Compounds Swelling   Family History  Problem Relation Age of Onset  . Diabetes Maternal Grandmother   . Heart failure Maternal Grandmother   . COPD Maternal Grandmother        smoker  .  Thyroid disease Maternal Grandmother   . Vascular Disease Maternal Grandmother   . Colon cancer Maternal Grandfather   . Colon polyps Mother   . Esophageal cancer Neg Hx   . Rectal cancer Neg Hx   . Stomach cancer Neg Hx    PE: BP 128/72 (BP Location: Left Arm, Patient Position: Sitting)   Pulse 83   Ht 5' 4"  (1.626 m)   Wt 128 lb (58.1 kg)   SpO2 97%   BMI 21.97 kg/m  Body mass index is 21.97 kg/m.  Wt Readings from Last 3 Encounters:  04/08/17 128 lb (58.1 kg)  03/19/17 128 lb 8 oz (58.3 kg)  10/29/16 132 lb (59.9 kg)   Constitutional: normal weight, in NAD Eyes: PERRLA, EOMI, no exophthalmos ENT: moist mucous membranes, no thyromegaly, no cervical lymphadenopathy Cardiovascular: RRR, No MRG Respiratory: CTA B Gastrointestinal: abdomen soft, NT, ND, BS+ Musculoskeletal: no deformities, strength intact in all 4 Skin: moist, warm, no rashes Neurological: no tremor with outstretched hands, DTR normal in all 4  ASSESSMENT: 1. DM1, uncontrolled, with complications - Mild DR - Gastroparesis - This is the reason why she is on regular insulin, and she is taking it 10 minutes  rather than 30 minutes before eating  She had low CBGs with Novolog!  2. HL  PLAN:  1. Patient with long-standing, uncontrolled DM1, on basal-bolus insulin tx, with similar control is at last visit. Her sugars are still fluctuating, without identified triggers. We switched from Lantus to [place] and [place] summer of 2017 and she does have less lows now. She had one in the 50s, again, without clear trigger. - We discussed about the fact that we are fairly limited in the weekend with her insulin regimen since there is no CBG  pattern  - she is wondering whether this bleeding the Tyler Aas may help.  - I explained to Tyler Aas is longer acting than Lantus  and may not make a difference, but we can still try. I advised her to use a lower dose at night since she is very sensitive to insulin overnight  - I suggested to:   Patient Instructions  Please try to split Tresiba to 6 units in am and 4 units at bedtime.  Please continue: - R insulin: - insulin to carb ratio (ICR)   B'fast: 20:1  Lunch: 20:1   Dinner: 20:1  - target 140  - insulin sensitivity factor (ISF) 60: 140-200: + 1 unit 201-260: + 2 units 261-320: + 3 units 321-380: + 4 units >380: + 5 units  Do not correct bedtime sugars <300, and only inject 1 unit then.   Please return in 4 months with your sugar log.   - today, HbA1c is 7.5% (stable) - continue checking sugars at different times of the day - check 3-4x a day, rotating checks - advised for yearly eye exams >> she is UTD - Return to clinic in 3 mo with sugar log   2. HL - On Pravastatin 40 >> her leg cramps are much better after she started to use the creams described in the history of present illness  - last lipid panel was great, on pravastatin   Philemon Kingdom, MD PhD [facility] Endocrinology

## 2017-04-10 ENCOUNTER — Encounter: Payer: Self-pay | Admitting: Physician Assistant

## 2017-04-10 ENCOUNTER — Ambulatory Visit (INDEPENDENT_AMBULATORY_CARE_PROVIDER_SITE_OTHER): Payer: BLUE CROSS/BLUE SHIELD | Admitting: Physician Assistant

## 2017-04-10 ENCOUNTER — Other Ambulatory Visit: Payer: BLUE CROSS/BLUE SHIELD

## 2017-04-10 VITALS — BP 94/60 | HR 80 | Ht 63.5 in | Wt 127.1 lb

## 2017-04-10 DIAGNOSIS — R197 Diarrhea, unspecified: Secondary | ICD-10-CM

## 2017-04-10 DIAGNOSIS — R1314 Dysphagia, pharyngoesophageal phase: Secondary | ICD-10-CM

## 2017-04-10 MED ORDER — PANTOPRAZOLE SODIUM 40 MG PO TBEC: 40.0000 mg | DELAYED_RELEASE_TABLET | Freq: Two times a day (BID) | ORAL | 1 refills | Status: DC

## 2017-04-10 NOTE — Progress Notes (Addendum)
Chief Complaint: Dysphagia and diarrhea  HPI:  Mrs. Mask is a 56 year old female with a past medical history of anemia, date GERD, hyperlipidemia, hypertension, IBS, systolic murmur and others listed below,  who was referred to me by Felicia Boyden, [name] for a complaint of dysphagia and diarrhea .      This patient is previously known to Felicia [name] and was last seen in clinic on 02/21/16 by Felicia Sou, PA-C and at that time was there for dysphagia as well. She described that over the previous 2 months she had issues of solid food such as hamburgers, salad and rice getting hung when she eats. It would go down eventually. She was apparently given Omeprazole 20 mg daily by her PCP that day to start. A presumed diagnosis of diabetic gastroparesis from previous GI doctors was discussed but the patient denied complaints of vomiting or severe nausea. At that time patient was scheduled for an EGD with possible dilation. She was also due for a colorectal cancer screening and scheduled for colonoscopy.    Patient had EGD and colonoscopy performed 07/11/16. EGD showed LA grade C reflux esophagitis, normal stomach and normal duodenum, she was told to use Pantoprazole 40 mg twice a day 4 weeks and then daily after. Colonoscopy was completely normal and patient was told to repeat in 10 years for screening.   Today, the patient tells me that after time of her endoscopy in September of last year she did feel somewhat better as far as her dysphagia was concerned, but after 4 weeks on Pantoprazole she stopped this medication and tells me that her symptoms gradually returned. Currently, she tells me she cannot sit down to a meal without having something feel like it "sticks in my throat". It will stick there and she tells me that she will produce a lot of air and gas and have burping and typically if she even takes a sip of water after something gets stuck it will come right back up. Patient denies any overt heartburn or  reflux symptoms.   Patient also tells me today that she has had urgent diarrhea since April. She has at least 5-6 stools a day if she does not use her Lomotil. Initially, she presented to the urgent care for this and has had a workup including stool culture, C. difficile and H. pylori as well as lipase, CBC and CMP which were all normal on 03/20/17. Patient describes that they gave her Lomotil then and she can take this typically just once a day after having her first diarrheal stool, but sometimes she has to take it 3 times a day. She has also developed some lower abdominal cramping with bowel movements and will use Dicyclomine for this. The patient is tired of having to use the medicine to stop her bowels. This does sometimes wake her from her sleep at night.   Patient denies fever, chills, blood in her stool, melena, weight loss, anorexia, nausea or vomiting.  Past Medical History:  Diagnosis Date  . Allergy   . Anemia    past history long ago  . Disorder of kidney    has abd kidney (right)  . GERD (gastroesophageal reflux disease)   . History of diabetic gastroparesis   . HLD (hyperlipidemia)    borderline  . HTN (hypertension)    borderline  . Hx: UTI (urinary tract infection)   . Hypertension   . IBS (irritable bowel syndrome)   . Nephrolithiasis 2005  . Osteopenia 2012  per pt  . Systolic murmur 01/2013   mild mitral insuff  . Type 1 diabetes mellitus with diabetic retinopathy without macular edema (HCC)    dx at 56 y/o, background retinopathy    Past Surgical History:  Procedure Laterality Date  . CESAREAN SECTION  1985  . COLONOSCOPY  07/2016   WNL (Felicia [name])  . ESOPHAGOGASTRODUODENOSCOPY  07/2016   LA Grade C reflux esophagitis, concern for stasis/dysmotility (Felicia [name])  . LAPAROSCOPIC APPENDECTOMY N/A 09/01/2015   Procedure: APPENDECTOMY LAPAROSCOPIC;  Surgeon: Felicia [name];  Location: [facility] OR;  Service: General;  Laterality: N/A;  . ORIF FEMUR FRACTURE Left 1996    Corrected by patient in history 2016  . PARTIAL HYSTERECTOMY  2000   paps by [facility] Felicia [name]  . TONSILLECTOMY AND ADENOIDECTOMY  1975  . TRIGGER FINGER RELEASE Left 12/2010   thumb    Current Outpatient Prescriptions  Medication Sig Dispense Refill  . aspirin 81 MG tablet Take 81 mg by mouth daily.      . BD INSULIN SYRINGE ULTRAFINE 31G X 15/64" 0.3 ML MISC USE TO INJECT INSULIN 6 TIMES PER DAY 200 each 1  . Coenzyme Q10 100 MG capsule Take 1 capsule (100 mg total) by mouth daily.    Marland Kitchen dicyclomine (BENTYL) 10 MG capsule Take 10 mg by mouth. Take one tablet every 6-8 hours as needed    . diphenoxylate-atropine (LOMOTIL) 2.5-0.025 MG tablet Take by mouth 4 (four) times daily as needed for diarrhea or loose stools.    Marland Kitchen glucagon (GLUCAGON EMERGENCY) 1 MG injection Inject 1 mg into the muscle once as needed (in case of severe hypoglycemia). (Patient not taking: Reported on 04/08/2017) 1 each 12  . insulin degludec (TRESIBA FLEXTOUCH) 100 UNIT/ML SOPN FlexTouch Pen Inject 0.09 mLs (9 Units total) into the skin daily before breakfast. 5 pen 3  . Insulin Pen Needle (FIFTY50 PEN NEEDLES) 32G X 4 MM MISC Use 4x a day 200 each 11  . insulin regular (NOVOLIN R) 250 units/2.57mL (100 units/mL) injection Inject 0.06 mLs (6 Units total) into the skin 3 (three) times daily before meals. Based on sliding scale and carbs. 20 mL 1  . Multiple Vitamin (MULTIVITAMIN) tablet Take 1 tablet by mouth daily.      Marland Kitchen NOVOLIN R 100 UNIT/ML injection INJECT 6 UNITS INTO THE SKIN 3 TIMES A DAY BEFORE MEALS BASED ON SLIDING SCALE AND CARBS 10 mL 2  . pravastatin (PRAVACHOL) 40 MG tablet Take 1 tablet (40 mg total) by mouth daily. 90 tablet 3  . quinapril (ACCUPRIL) 5 MG tablet TAKE 1 TABLET (5 MG TOTAL) BY MOUTH DAILY. 30 tablet 0   Current Facility-Administered Medications  Medication Dose Route Frequency Provider Last Rate Last Dose  . 0.9 %  sodium chloride infusion  500 mL Intravenous Continuous Felicia [name], Carie Caddy, [name]         Allergies as of 04/10/2017 - Review Complete 04/08/2017  Allergen Reaction Noted  . Toujeo solostar [insulin glargine] Shortness Of Breath 04/21/2015  . Lactose intolerance (gi)  06/15/2013  . Magnesium-containing compounds Swelling 09/14/2015    Family History  Problem Relation Age of Onset  . Diabetes Maternal Grandmother   . Heart failure Maternal Grandmother   . COPD Maternal Grandmother        smoker  . Thyroid disease Maternal Grandmother   . Vascular Disease Maternal Grandmother   . Colon cancer Maternal Grandfather   . Colon polyps Mother   . Esophageal cancer Neg Hx   .  Rectal cancer Neg Hx   . Stomach cancer Neg Hx     Social History   Social History  . Marital status: Married    Spouse name: N/A  . Number of children: 1  . Years of education: N/A   Occupational History  . HR director Other    [employer]   Social History Main Topics  . Smoking status: Never Smoker  . Smokeless tobacco: Never Used  . Alcohol use No  . Drug use: No  . Sexual activity: Not on file   Other Topics Concern  . 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 Eyes:   PEERL, EOMI. No icterus. Conjunctiva pink. Ears:  Normal auditory acuity. Neck:  Supple Throat: Oral cavity and pharynx without inflammation, swelling or lesion.    Respiratory: Respirations even and unlabored. Lungs clear to auscultation bilaterally.   No wheezes, crackles, or rhonchi.  Cardiovascular: Normal S1, S2. No MRG. Regular rate and rhythm. No peripheral edema, cyanosis or pallor.  Gastrointestinal:  Soft, nondistended, mild b/l lower abdominal ttp. No rebound or guarding. Normal bowel sounds. No appreciable masses or hepatomegaly. Rectal:  Not performed.  Msk:  Symmetrical without gross deformities. Without edema, no deformity or joint abnormality.  Neurologic:  Alert and  oriented x4;  grossly normal neurologically.  Skin:   Dry and intact without significant lesions or rashes. Psychiatric: Demonstrates good judgement and reason without abnormal affect or behaviors.  MOST RECENT LABS AND IMAGING: CBC    Component Value Date/Time   WBC 5.2 03/19/2017 1818   RBC 4.06 03/19/2017 1818   HGB 12.1 03/19/2017 1818   HCT 36.0 03/19/2017 1818   PLT 261.0 03/19/2017 1818   MCV 88.6 03/19/2017 1818   MCH 29.2 09/01/2015 1835   MCHC 33.6 03/19/2017 1818   RDW 13.4 03/19/2017 1818   LYMPHSABS 1.6 03/19/2017 1818   MONOABS 0.5 03/19/2017 1818   EOSABS 0.3 03/19/2017 1818   BASOSABS 0.1 03/19/2017 1818    CMP     Component Value Date/Time   NA 136 03/19/2017 1818   K 5.0 03/19/2017 1818   CL 101 03/19/2017 1818   CO2 32 03/19/2017 1818   GLUCOSE 353 (H) 03/19/2017 1818   BUN 21 03/19/2017 1818   CREATININE 0.99 03/19/2017 1818   CREATININE 0.90 07/03/2011 1107   CALCIUM 9.5 03/19/2017 1818   PROT 7.1 03/19/2017 1818   ALBUMIN 4.2 03/19/2017 1818   AST 18 03/19/2017 1818   ALT 16 03/19/2017 1818   ALKPHOS 64 03/19/2017 1818   BILITOT 0.3 03/19/2017 1818   GFRNONAA >60 09/01/2015 1835   GFRAA >60 09/01/2015 1835    Assessment: 1. Dysphagia: Recurring, patient recently had EGD 07/11/16 for the same reason with findings of  LA grade C esophagitis, patient was put on Pantoprazole 40mg   twice a day for 4 weeks and stopped this medication  and since then has had a slow return of her symptoms to the point where now she cannot eat anything without feeling like it gets stuck in her throat; consider esophagitis vs stricture versus ring versus web versus mass versus other 2. Diarrhea: Looking back this appears somewhat chronic for the patient, has been diagnosed with IBS in the past and recent colonoscopy was completely normal in September, stool studies including C. difficile, stool culture and H. pylori fecal antigen were negative, this does sometimes wake her from her sleep; consider inflammation versus infection versus IBS versus diabetic colopathy versus bacterial overgrowth versus other  Plan: 1. At this time since patient has had a recent EGD will proceed with a barium esophagogram with tablet for further evaluation of her dysphagia. If this is abnormal we can proceed with EGD at that time. 2. Patient was restarted on Pantoprazole 40 mg twice a day for the next 4 weeks. Prescribed #60 with one refill 3. Reviewed anti-dysphagia measures. 4. Ordered further stool studies to include fecal pancreatic elastase, GI pathogen panel and lactoferrin. If these are normal could consider trial of Viberzi or Xifaxan 5. At the moment instructed patient to schedule her Lomotil twice a day instead of using when necessary 6. Patient to follow in clinic in 3-4 weeks Felicia [name] or myself  Felicia Meeker, PA-C [facility] 04/10/2017, 8:40 AM  Cc: Felicia Boyden, [name]   Addendum: Reviewed and agree with initial management. Felicia [name], Carie Caddy, [name]

## 2017-04-10 NOTE — Patient Instructions (Addendum)
Your physician has requested that you go to the basement for lab work before leaving today.  Schedule Lomotil (Diphenoxylate-Atropine) twice a day.   You have been scheduled for a Barium Esophogram [facility] (1st floor of the hospital) on 05-06-17 at 10:30 am. Please arrive 15 minutes prior to your appointment for registration. Make certain not to have anything to eat or drink 6 hours prior to your test. If you need to reschedule for any reason, please contact radiology at [phone] to do so. ________________________________________________________________ A barium swallow is an examination that concentrates on views of the esophagus. This tends to be a double contrast exam (barium and two liquids which, when combined, create a gas to distend the wall of the oesophagus) or single contrast (non-ionic iodine based). The study is usually tailored to your symptoms so a good history is essential. Attention is paid during the study to the form, structure and configuration of the esophagus, looking for functional disorders (such as aspiration, dysphagia, achalasia, motility and reflux) EXAMINATION You may be asked to change into a gown, depending on the type of swallow being performed. A radiologist and radiographer will perform the procedure. The radiologist will advise you of the type of contrast selected for your procedure and direct you during the exam. You will be asked to stand, sit or lie in several different positions and to hold a small amount of fluid in your mouth before being asked to swallow while the imaging is performed .In some instances you may be asked to swallow barium coated marshmallows to assess the motility of a solid food bolus. The exam can be recorded as a digital or video fluoroscopy procedure. POST PROCEDURE It will take 1-2 days for the barium to pass through your system. To facilitate this, it is important, unless otherwise directed, to increase your fluids for  the next 24-48hrs and to resume your normal diet.  This test typically takes about 30 minutes to perform. __________________________________________________________________________________

## 2017-04-11 LAB — FECAL LACTOFERRIN, QUANT: Lactoferrin: NEGATIVE

## 2017-04-14 ENCOUNTER — Telehealth: Payer: Self-pay | Admitting: Physician Assistant

## 2017-04-14 LAB — GASTROINTESTINAL PATHOGEN PANEL PCR
C. difficile Tox A/B, PCR: NOT DETECTED
Campylobacter, PCR: NOT DETECTED
Cryptosporidium, PCR: NOT DETECTED
E coli (ETEC) LT/ST PCR: NOT DETECTED
E coli (STEC) stx1/stx2, PCR: NOT DETECTED
E coli 0157, PCR: NOT DETECTED
Giardia lamblia, PCR: NOT DETECTED
Norovirus, PCR: NOT DETECTED
Rotavirus A, PCR: NOT DETECTED
Salmonella, PCR: NOT DETECTED
Shigella, PCR: NOT DETECTED

## 2017-04-14 MED ORDER — DIPHENOXYLATE-ATROPINE 2.5-0.025 MG PO TABS: 1.0000 | ORAL_TABLET | Freq: Two times a day (BID) | ORAL | 0 refills | Status: DC

## 2017-04-14 NOTE — Telephone Encounter (Signed)
Left message on machine to call back  Prescription has been faxed to the pharmacy

## 2017-04-16 ENCOUNTER — Ambulatory Visit: Payer: BLUE CROSS/BLUE SHIELD | Admitting: Internal Medicine

## 2017-04-16 MED ORDER — ESOMEPRAZOLE MAGNESIUM 40 MG PO CPDR: 40.0000 mg | DELAYED_RELEASE_CAPSULE | Freq: Two times a day (BID) | ORAL | 6 refills | Status: DC

## 2017-04-16 NOTE — Telephone Encounter (Signed)
The pt states she took protonix and her face began to swell during the day.  When she got home she took benadryl and stopped the protonix and the swelling resolved.  She wants to know if she needs to try another PPI or something else?

## 2017-04-16 NOTE — Telephone Encounter (Signed)
Would try esomeprazole 40 mg BIDAC in place of protonix Add pantoprazole to allergy list -- facial swelling

## 2017-04-16 NOTE — Telephone Encounter (Signed)
Script sent to pharmacy, allergy added to chart.

## 2017-04-16 NOTE — Telephone Encounter (Signed)
Vonna KotykJay, this was sent to me by accident.    DSamson Frederic

## 2017-04-21 LAB — PANCREATIC ELASTASE, FECAL: Pancreatic Elastase-1, Stool: 211 mcg/g

## 2017-04-28 ENCOUNTER — Ambulatory Visit: Payer: BLUE CROSS/BLUE SHIELD | Admitting: Physician Assistant

## 2017-05-02 ENCOUNTER — Telehealth: Payer: Self-pay | Admitting: *Deleted

## 2017-05-02 NOTE — Telephone Encounter (Signed)
Xifaxan approved from [facility]. Reference Number [id number]. Auth good from 05/01/17-04/30/18.

## 2017-05-05 ENCOUNTER — Telehealth: Payer: Self-pay | Admitting: Physician Assistant

## 2017-05-05 NOTE — Telephone Encounter (Signed)
The pt states she cx the procedure and spoke with [facility] regarding the medication.  She will call back with any further questions or concerns

## 2017-05-05 NOTE — Telephone Encounter (Signed)
Left message on machine to call back  

## 2017-05-06 ENCOUNTER — Ambulatory Visit (HOSPITAL_COMMUNITY): Payer: BLUE CROSS/BLUE SHIELD

## 2017-05-12 ENCOUNTER — Other Ambulatory Visit: Payer: Self-pay | Admitting: Internal Medicine

## 2017-05-12 ENCOUNTER — Other Ambulatory Visit: Payer: Self-pay

## 2017-05-12 ENCOUNTER — Other Ambulatory Visit: Payer: Self-pay | Admitting: Physician Assistant

## 2017-05-12 MED ORDER — DIPHENOXYLATE-ATROPINE 2.5-0.025 MG PO TABS: 1.0000 | ORAL_TABLET | Freq: Two times a day (BID) | ORAL | 0 refills | Status: DC

## 2017-05-12 NOTE — Telephone Encounter (Signed)
Please execute rx medications due to today being the last day for insurance. Refill all medications sent over by  [id number]- [place], [place] - [address] [phone])   Call patient to advise.

## 2017-05-12 NOTE — Telephone Encounter (Signed)
Lomotil rx faxed to pharmacy, approved by Hyacinth MeekerJennifer Lemmon PA-C

## 2017-05-12 NOTE — Telephone Encounter (Signed)
Okay to refill Felicia DikeJennifer?

## 2017-05-12 NOTE — Telephone Encounter (Signed)
Lomotil approved by Hyacinth MeekerJennifer Lemmon PA-C and faxed to pharmacy.

## 2017-05-30 ENCOUNTER — Ambulatory Visit: Payer: BLUE CROSS/BLUE SHIELD | Admitting: Physician Assistant

## 2017-06-01 ENCOUNTER — Other Ambulatory Visit: Payer: Self-pay | Admitting: Family Medicine

## 2017-06-02 NOTE — Telephone Encounter (Signed)
Pt has been seen only for acute. Last Rx indicates OV required. pls advise

## 2017-06-16 ENCOUNTER — Other Ambulatory Visit: Payer: Self-pay | Admitting: Obstetrics and Gynecology

## 2017-06-16 DIAGNOSIS — Z1231 Encounter for screening mammogram for malignant neoplasm of breast: Secondary | ICD-10-CM

## 2017-06-30 ENCOUNTER — Ambulatory Visit (INDEPENDENT_AMBULATORY_CARE_PROVIDER_SITE_OTHER): Payer: BLUE CROSS/BLUE SHIELD | Admitting: Family Medicine

## 2017-06-30 ENCOUNTER — Encounter: Payer: Self-pay | Admitting: Family Medicine

## 2017-06-30 VITALS — BP 122/80 | HR 88 | Temp 98.5°F | Ht 63.5 in | Wt 128.8 lb

## 2017-06-30 DIAGNOSIS — J069 Acute upper respiratory infection, unspecified: Secondary | ICD-10-CM

## 2017-06-30 MED ORDER — BENZONATATE 100 MG PO CAPS: 100.0000 mg | ORAL_CAPSULE | Freq: Two times a day (BID) | ORAL | 0 refills | Status: DC | PRN

## 2017-06-30 NOTE — Progress Notes (Signed)
HPI:  Acute visit for cough and congestion -started: about 5-7 days ago -symptoms:nasal congestion, PND, sore throat, cough -denies:fever, SOB, NVD, tooth pain, ear pain -has tried: antihistamine and flonase rxd by [facility], tooks some amoxicillin she had at home as well -sick contacts/travel/risks: no reported flu, strep or tick exposure - granddaughter with the same and now improving -Hx of: allergies  ROS: See pertinent positives and negatives per HPI.  Past Medical History:  Diagnosis Date  . Allergy   . Anemia    past history long ago  . Disorder of kidney    has abd kidney (right)  . GERD (gastroesophageal reflux disease)   . History of diabetic gastroparesis   . HLD (hyperlipidemia)    borderline  . HTN (hypertension)    borderline  . Hx: UTI (urinary tract infection)   . Hypertension   . IBS (irritable bowel syndrome)   . Nephrolithiasis 2005  . Osteopenia 2012   per pt  . Systolic murmur 01/2013   mild mitral insuff  . Type 1 diabetes mellitus with diabetic retinopathy without macular edema (HCC)    dx at 56 y/o, background retinopathy    Past Surgical History:  Procedure Laterality Date  . CESAREAN SECTION  1985  . COLONOSCOPY  07/2016   WNL (Pyrtle)  . ESOPHAGOGASTRODUODENOSCOPY  07/2016   LA Grade C reflux esophagitis, concern for stasis/dysmotility (Pyrtle)  . LAPAROSCOPIC APPENDECTOMY N/A 09/01/2015   Procedure: APPENDECTOMY LAPAROSCOPIC;  Surgeon: Jimmye Norman, MD;  Location: [facility] OR;  Service: General;  Laterality: N/A;  . ORIF FEMUR FRACTURE Left 1996   Corrected by patient in history 2016  . PARTIAL HYSTERECTOMY  2000   paps by [facility] [place]  . TONSILLECTOMY AND ADENOIDECTOMY  1975  . TRIGGER FINGER RELEASE Left 12/2010   thumb    Family History  Problem Relation Age of Onset  . Diabetes Maternal Grandmother   . Heart failure Maternal Grandmother   . COPD Maternal Grandmother        smoker  . Thyroid disease Maternal Grandmother   .  Vascular Disease Maternal Grandmother   . Colon cancer Maternal Grandfather   . Colon polyps Mother   . Esophageal cancer Neg Hx   . Rectal cancer Neg Hx   . Stomach cancer Neg Hx     Social History   Social History  . Marital status: Married    Spouse name: N/A  . Number of children: 1  . Years of education: N/A   Occupational History  . HR director Other    [employer]   Social History Main Topics  . Smoking status: Never Smoker  . Smokeless tobacco: Never Used  . Alcohol use No  . Drug use: No  . Sexual activity: Not Asked   Other Topics Concern  . 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  aspirin 81 MG tablet, Take 81 mg by mouth daily.  , Disp: , Rfl:  .  BD INSULIN SYRINGE ULTRAFINE 31G X 15/64" 0.3 ML MISC, USE TO INJECT INSULIN 6 TIMES PER DAY, Disp: 200 each, Rfl: 1 .  Coenzyme Q10 100 MG capsule, Take 1 capsule (100 mg total) by mouth daily., Disp: , Rfl:  .  dicyclomine (BENTYL) 10 MG capsule, Take 10 mg by  mouth. Take one tablet every 6-8 hours as needed, Disp: , Rfl:  .  diphenoxylate-atropine (LOMOTIL) 2.5-0.025 MG tablet, Take 1 tablet by mouth 2 (two) times daily., Disp: 60 tablet, Rfl: 0 .  glucagon (GLUCAGON EMERGENCY) 1 MG injection, Inject 1 mg into the muscle once as needed (in case of severe hypoglycemia)., Disp: 1 each, Rfl: 12 .  insulin degludec (TRESIBA FLEXTOUCH) 100 UNIT/ML SOPN FlexTouch Pen, Inject 0.09 mLs (9 Units total) into the skin daily before breakfast., Disp: 5 pen, Rfl: 3 .  Insulin Pen Needle (FIFTY50 PEN NEEDLES) 32G X 4 MM MISC, Use 4x a day, Disp: 200 each, Rfl: 11 .  Multiple Vitamin (MULTIVITAMIN) tablet, Take 1 tablet by mouth daily.  , Disp: , Rfl:  .  NOVOLIN R 100 UNIT/ML injection, INJECT 6 UNITS INTO THE SKIN 3 TIMES A DAY BEFORE MEALS BASED ON SLIDING SCALE AND CARBS,  Disp: 10 mL, Rfl: 2 .  pravastatin (PRAVACHOL) 40 MG tablet, TAKE 1 TABLET (40 MG TOTAL) BY MOUTH DAILY., Disp: 90 tablet, Rfl: 3 .  quinapril (ACCUPRIL) 5 MG tablet, Take 1 tablet (5 mg total) by mouth daily., Disp: 30 tablet, Rfl: 3 .  TRESIBA FLEXTOUCH 100 UNIT/ML SOPN FlexTouch Pen, INJECT 12 UNITS INTO THE SKIN DAILY BEFORE BREAKFAST., Disp: 15 pen, Rfl: 3 .  benzonatate (TESSALON) 100 MG capsule, Take 1 capsule (100 mg total) by mouth 2 (two) times daily as needed for cough., Disp: 20 capsule, Rfl: 0  Current Facility-Administered Medications:  .  0.9 %  sodium chloride infusion, 500 mL, Intravenous, Continuous, Pyrtle, Carie Caddy, MD  EXAM:  Vitals:   06/30/17 1306  BP: 122/80  Pulse: 88  Temp: 98.5 F (36.9 C)  SpO2: 99%    Body mass index is 22.46 kg/m.  GENERAL: vitals reviewed and listed above, alert, oriented, appears well hydrated and in no acute distress  HEENT: atraumatic, conjunttiva clear, no obvious abnormalities on inspection of external nose and ears, normal appearance of ear canals and TMs except for clear effusion L, clear nasal congestion, mild post oropharyngeal erythema with PND, no tonsillar edema or exudate, no sinus TTP  NECK: no obvious masses on inspection  LUNGS: clear to auscultation bilaterally, no wheezes, rales or rhonchi, good air movement  CV: HRRR, no peripheral edema  MS: moves all extremities without noticeable abnormality  PSYCH: pleasant and cooperative, no obvious depression or anxiety  ASSESSMENT AND PLAN:  Discussed the following assessment and plan:  Upper respiratory tract infection, unspecified type  -given HPI and exam findings today, a serious infection or illness is unlikely. We discussed potential etiologies, with VURI being most likely, and advised supportive care and close monitoring. Tessalon for cough. We discussed treatment side effects, likely course, antibiotic misuse, transmission, and signs of developing a serious  illness. -of course, we advised to return or notify a doctor immediately if symptoms worsen or persist or new concerns arise.    Patient Instructions  INSTRUCTIONS FOR UPPER RESPIRATORY INFECTION:  -plenty of rest and fluids  -nasal saline wash 2-3 times daily (use prepackaged nasal saline or bottled/distilled water if making your own)   -can use AFRIN nasal spray for drainage and nasal congestion - but do NOT use longer then 3-4 days  -can use tylenol (in no history of liver disease) or ibuprofen (if no history of kidney disease, bowel bleeding or significant heart disease) as directed for aches and sorethroat  -in the winter time, using a humidifier at night is helpful (please  follow cleaning instructions)  -if you are taking a cough medication - use only as directed, may also try a teaspoon of honey to coat the throat and throat lozenges.  I sent the tessalon to the pharmacy for you.  -for sore throat, salt water gargles can help  -follow up if you have fevers, facial pain, tooth pain, difficulty breathing or are worsening or symptoms persist longer then expected  Upper Respiratory Infection, Adult An upper respiratory infection (URI) is also known as the common cold. It is often caused by a type of germ (virus). Colds are easily spread (contagious). You can pass it to others by kissing, coughing, sneezing, or drinking out of the same glass. Usually, you get better in 1 to 3  weeks.  However, the cough can last for even longer. HOME CARE   Only take medicine as told by your doctor. Follow instructions provided above.  Drink enough water and fluids to keep your pee (urine) clear or pale yellow.  Get plenty of rest.  Return to work when your temperature is < 100 for 24 hours or as told by your doctor. You may use a face mask and wash your hands to stop your cold from spreading. GET HELP RIGHT AWAY IF:   After the first few days, you feel you are getting worse.  You have  questions about your medicine.  You have chills, shortness of breath, or red spit (mucus).  You have pain in the face for more then 1-2 days, especially when you bend forward.  You have a fever, puffy (swollen) neck, pain when you swallow, or white spots in the back of your throat.  You have a bad headache, ear pain, sinus pain, or chest pain.  You have a high-pitched whistling sound when you breathe in and out (wheezing).  You cough up blood.  You have sore muscles or a stiff neck. MAKE SURE YOU:   Understand these instructions.  Will watch your condition.  Will get help right away if you are not doing well or get worse. Document Released: 04/08/2008 Document Revised: 01/13/2012 Document Reviewed: 01/26/2014 [facility] Patient Information 2015 [place], [place]. This information is not intended to replace advice given to you by your health care provider. Make sure you discuss any questions you have with your health care provider.    Kriste Basque R., DO

## 2017-06-30 NOTE — Patient Instructions (Signed)
INSTRUCTIONS FOR UPPER RESPIRATORY INFECTION:  -plenty of rest and fluids  -nasal saline wash 2-3 times daily (use prepackaged nasal saline or bottled/distilled water if making your own)   -can use AFRIN nasal spray for drainage and nasal congestion - but do NOT use longer then 3-4 days  -can use tylenol (in no history of liver disease) or ibuprofen (if no history of kidney disease, bowel bleeding or significant heart disease) as directed for aches and sorethroat  -in the winter time, using a humidifier at night is helpful (please follow cleaning instructions)  -if you are taking a cough medication - use only as directed, may also try a teaspoon of honey to coat the throat and throat lozenges.  I sent the tessalon to the pharmacy for you.  -for sore throat, salt water gargles can help  -follow up if you have fevers, facial pain, tooth pain, difficulty breathing or are worsening or symptoms persist longer then expected  Upper Respiratory Infection, Adult An upper respiratory infection (URI) is also known as the common cold. It is often caused by a type of germ (virus). Colds are easily spread (contagious). You can pass it to others by kissing, coughing, sneezing, or drinking out of the same glass. Usually, you get better in 1 to 3  weeks.  However, the cough can last for even longer. HOME CARE   Only take medicine as told by your doctor. Follow instructions provided above.  Drink enough water and fluids to keep your pee (urine) clear or pale yellow.  Get plenty of rest.  Return to work when your temperature is < 100 for 24 hours or as told by your doctor. You may use a face mask and wash your hands to stop your cold from spreading. GET HELP RIGHT AWAY IF:   After the first few days, you feel you are getting worse.  You have questions about your medicine.  You have chills, shortness of breath, or red spit (mucus).  You have pain in the face for more then 1-2 days, especially when  you bend forward.  You have a fever, puffy (swollen) neck, pain when you swallow, or white spots in the back of your throat.  You have a bad headache, ear pain, sinus pain, or chest pain.  You have a high-pitched whistling sound when you breathe in and out (wheezing).  You cough up blood.  You have sore muscles or a stiff neck. MAKE SURE YOU:   Understand these instructions.  Will watch your condition.  Will get help right away if you are not doing well or get worse. Document Released: 04/08/2008 Document Revised: 01/13/2012 Document Reviewed: 01/26/2014 [facility] Patient Information 2015 [place], [place]. This information is not intended to replace advice given to you by your health care provider. Make sure you discuss any questions you have with your health care provider.

## 2017-07-02 ENCOUNTER — Encounter: Payer: Self-pay | Admitting: Family Medicine

## 2017-07-02 ENCOUNTER — Ambulatory Visit (INDEPENDENT_AMBULATORY_CARE_PROVIDER_SITE_OTHER): Payer: BLUE CROSS/BLUE SHIELD | Admitting: Family Medicine

## 2017-07-02 VITALS — BP 118/72 | HR 80 | Temp 98.1°F | Ht 63.5 in | Wt 127.8 lb

## 2017-07-02 DIAGNOSIS — M546 Pain in thoracic spine: Secondary | ICD-10-CM | POA: Diagnosis not present

## 2017-07-02 DIAGNOSIS — J069 Acute upper respiratory infection, unspecified: Secondary | ICD-10-CM

## 2017-07-02 DIAGNOSIS — E103299 Type 1 diabetes mellitus with mild nonproliferative diabetic retinopathy without macular edema, unspecified eye: Secondary | ICD-10-CM | POA: Diagnosis not present

## 2017-07-02 MED ORDER — AZITHROMYCIN 250 MG PO TABS: ORAL_TABLET | ORAL | 0 refills | Status: DC

## 2017-07-02 NOTE — Patient Instructions (Signed)
I think you have strained thoracic spine - treat with heating pad, tylenol as needed. Continue fast mucous relief (guaifenesin + dextromethorphan).  Treat with zpack antibiotic.  If ongoing or worsening productive cough, fever, or thoracic back pain despite this, let us know.

## 2017-07-02 NOTE — Assessment & Plan Note (Signed)
Consistent with thoracic strain from coughing. No tenderness midline spine so not consistent with vertebral fracture.

## 2017-07-02 NOTE — Progress Notes (Addendum)
BP 118/72   Pulse 80   Temp 98.1 F (36.7 C) (Oral)   Ht 5' 3.5" (1.613 m)   Wt 127 lb 12.8 oz (58 kg)   SpO2 98%   BMI 22.28 kg/m    CC: cough Subjective:    Patient ID: Felicia [name], female    DOB: 09/04/1961, 56 y.o.   MRN: [id number]  HPI: Felicia [name] is a 56 y.o. female presenting on 07/02/2017 for Cough (over 2 weeks--no fever--with chest congestion); Nasal Congestion; and Back Pain (mid-back)   2 wk h/o cough, nasal congestion, chest congestion. Started with ear pressure and progressed into sinuses.   Seen initially at [facility] thought allergies, then by Dr Selena Batten at [place] on Monday, note reviewed - thought viral URI, rec nasal saline, afrin, tylenol, and tessalon for cough. She had also tried some leftover amoxicillin at home as well. She felt amoxicillin helped. Tessalon perls weren't helpful.  Yesterday noticed chest pain with coughing and ongoing mid back pain, constant.  She has been taking mucous relief which has helped cough.   Denies fevers/chills, no further ear or sinus pressure, ST, PNdrainage has improved.   Grand daughter sick - recently started on claritin with improvement.  T1DM No h/o asthma. Non smoker.   New job at [employer]. Has had to postpone starting date due to illness.   Relevant past medical, surgical, family and social history reviewed and updated as indicated. Interim medical history since our last visit reviewed. Allergies and medications reviewed and updated. Outpatient Medications Prior to Visit  Medication Sig Dispense Refill  . aspirin 81 MG tablet Take 81 mg by mouth daily.      . BD INSULIN SYRINGE ULTRAFINE 31G X 15/64" 0.3 ML MISC USE TO INJECT INSULIN 6 TIMES PER DAY 200 each 1  . glucagon (GLUCAGON EMERGENCY) 1 MG injection Inject 1 mg into the muscle once as needed (in case of severe hypoglycemia). 1 each 12  . Insulin Pen Needle (FIFTY50 PEN NEEDLES) 32G X 4 MM MISC Use 4x a day 200 each 11  . Multiple Vitamin  (MULTIVITAMIN) tablet Take 1 tablet by mouth daily.      Marland Kitchen NOVOLIN R 100 UNIT/ML injection INJECT 6 UNITS INTO THE SKIN 3 TIMES A DAY BEFORE MEALS BASED ON SLIDING SCALE AND CARBS 10 mL 2  . pravastatin (PRAVACHOL) 40 MG tablet TAKE 1 TABLET (40 MG TOTAL) BY MOUTH DAILY. 90 tablet 3  . quinapril (ACCUPRIL) 5 MG tablet Take 1 tablet (5 mg total) by mouth daily. 30 tablet 3  . TRESIBA FLEXTOUCH 100 UNIT/ML SOPN FlexTouch Pen INJECT 12 UNITS INTO THE SKIN DAILY BEFORE BREAKFAST. (Patient taking differently: Inject 10 Units into the skin daily before breakfast. ) 15 pen 3  . insulin degludec (TRESIBA FLEXTOUCH) 100 UNIT/ML SOPN FlexTouch Pen Inject 0.09 mLs (9 Units total) into the skin daily before breakfast. (Patient taking differently: Inject 10 Units into the skin daily before breakfast. ) 5 pen 3  . benzonatate (TESSALON) 100 MG capsule Take 1 capsule (100 mg total) by mouth 2 (two) times daily as needed for cough. 20 capsule 0  . Coenzyme Q10 100 MG capsule Take 1 capsule (100 mg total) by mouth daily.    Marland Kitchen dicyclomine (BENTYL) 10 MG capsule Take 10 mg by mouth. Take one tablet every 6-8 hours as needed    . diphenoxylate-atropine (LOMOTIL) 2.5-0.025 MG tablet Take 1 tablet by mouth 2 (two) times daily. 60 tablet 0  Facility-Administered Medications Prior to Visit  Medication Dose Route Frequency Provider Last Rate Last Dose  . 0.9 %  sodium chloride infusion  500 mL Intravenous Continuous Pyrtle, Carie Caddy, MD         Per HPI unless specifically indicated in ROS section below Review of Systems     Objective:    BP 118/72   Pulse 80   Temp 98.1 F (36.7 C) (Oral)   Ht 5' 3.5" (1.613 m)   Wt 127 lb 12.8 oz (58 kg)   SpO2 98%   BMI 22.28 kg/m   Wt Readings from Last 3 Encounters:  07/02/17 127 lb 12.8 oz (58 kg)  06/30/17 128 lb 12.8 oz (58.4 kg)  04/10/17 127 lb 2 oz (57.7 kg)    Physical Exam  Constitutional: She appears well-developed and well-nourished. No distress.  HENT:    Head: Normocephalic and atraumatic.  Right Ear: Hearing, tympanic membrane, external ear and ear canal normal.  Left Ear: Hearing, tympanic membrane, external ear and ear canal normal.  Nose: No mucosal edema or rhinorrhea. Right sinus exhibits no maxillary sinus tenderness and no frontal sinus tenderness. Left sinus exhibits no maxillary sinus tenderness and no frontal sinus tenderness.  Mouth/Throat: Uvula is midline, oropharynx is clear and moist and mucous membranes are normal. No oropharyngeal exudate, posterior oropharyngeal edema, posterior oropharyngeal erythema or tonsillar abscesses.  Mild congestion present  Eyes: Pupils are equal, round, and reactive to light. Conjunctivae and EOM are normal. No scleral icterus.  Neck: Normal range of motion. Neck supple.  Cardiovascular: Normal rate, regular rhythm, normal heart sounds and intact distal pulses.   No murmur heard. Pulmonary/Chest: Effort normal and breath sounds normal. No respiratory distress. She has no wheezes. She has no rales.  Musculoskeletal:  No midline thoracic spine tenderness ++ thoracic paraspinous mm tenderness to palpation  Lymphadenopathy:    She has no cervical adenopathy.  Skin: Skin is warm and dry. No rash noted.  Nursing note and vitals reviewed.  Lab Results  Component Value Date   HGBA1C 7.5 04/08/2017       Assessment & Plan:   Problem List Items Addressed This Visit    Thoracic back pain    Consistent with thoracic strain from coughing. No tenderness midline spine so not consistent with vertebral fracture.       Type 1 diabetes mellitus with background diabetic retinopathy (HCC)   Upper respiratory infection with cough and congestion - Primary    Ongoing over 2 wks, some progression of symptoms. Given diabetic, will cover with azithromycin abx. Update if not improving with treatment.      Relevant Medications   azithromycin (ZITHROMAX) 250 MG tablet       Follow up plan: Return if  symptoms worsen or fail to improve.  [name], MD

## 2017-07-02 NOTE — Assessment & Plan Note (Signed)
Ongoing over 2 wks, some progression of symptoms. Given diabetic, will cover with azithromycin abx. Update if not improving with treatment.

## 2017-07-03 ENCOUNTER — Ambulatory Visit
Admission: RE | Admit: 2017-07-03 | Discharge: 2017-07-03 | Disposition: A | Discharge status: Home / Self Care | Payer: BLUE CROSS/BLUE SHIELD | Source: Ambulatory Visit | Attending: Obstetrics and Gynecology | Admitting: Obstetrics and Gynecology

## 2017-07-03 DIAGNOSIS — Z1231 Encounter for screening mammogram for malignant neoplasm of breast: Secondary | ICD-10-CM

## 2017-08-11 ENCOUNTER — Ambulatory Visit: Payer: BLUE CROSS/BLUE SHIELD | Admitting: Internal Medicine

## 2017-08-13 ENCOUNTER — Ambulatory Visit: Payer: BLUE CROSS/BLUE SHIELD | Admitting: Internal Medicine

## 2017-08-14 ENCOUNTER — Ambulatory Visit (INDEPENDENT_AMBULATORY_CARE_PROVIDER_SITE_OTHER): Payer: BLUE CROSS/BLUE SHIELD | Admitting: Internal Medicine

## 2017-08-14 ENCOUNTER — Encounter: Payer: Self-pay | Admitting: Internal Medicine

## 2017-08-14 VITALS — BP 118/60 | HR 83 | Ht 64.0 in | Wt 130.0 lb

## 2017-08-14 DIAGNOSIS — E785 Hyperlipidemia, unspecified: Secondary | ICD-10-CM | POA: Diagnosis not present

## 2017-08-14 DIAGNOSIS — E103299 Type 1 diabetes mellitus with mild nonproliferative diabetic retinopathy without macular edema, unspecified eye: Secondary | ICD-10-CM

## 2017-08-14 LAB — POCT GLYCOSYLATED HEMOGLOBIN (HGB A1C): Hemoglobin A1C: 7.8

## 2017-08-14 NOTE — Addendum Note (Signed)
Addended by: Darene Lamer T on: 08/14/2017 11:21 AM   Modules accepted: Orders

## 2017-08-14 NOTE — Progress Notes (Signed)
Patient ID: Felicia [name], female   DOB: 1961-02-24, 56 y.o.   MRN: [id number]  HPI: Felicia [name] is a 56 y.o.-year-old female, returning for follow-up for DM1, dx in 27 (age 63), uncontrolled, with complications (mild retinopathy, gastroparesis). Last visit 4 mo ago.  She was on a pump x 5 years (~2010), but did not like it. Sugars were NOT better when she used the pump. She is not interested to get back on the pump.   Last hemoglobin A1c was: Lab Results  Component Value Date   HGBA1C 7.5 04/08/2017   HGBA1C 7.5 10/29/2016   HGBA1C 7.1 07/30/2016   She is on: - Tresiba 10 units in am (was having lows on 12 units). At last visit, I advised her to decrease further, but she kept the dose at 10 units >> 6 units in am and 4 units at bedtime >> 10 units at bedtime. - R insulin (Novolin): - insulin to carb ratio (ICR)   B'fast: 20:1 >> 15:1  Lunch: 20:1 >> 15:1  Dinner: 20:1 >> 15:1 - target 140 - insulin sensitivity factor (ISF) 60  140-200: + 1 unit 201-260: + 2 units 261-320: + 3 units 321-380: + 4 units >380: + 5 units Do not correct bedtime sugars <300, and only inject 1 unit then.  Tried Toujeo >> allergy: CP, SOB.  Meter: ReliOn  Pt checks her sugars 4-5x a day: - am:  75, 85-168, 276 >> 57, 115-221 >> 88, 109-264, 320 - 2h after b'fast:   77 >> 153 >> n/c >> 82 - before lunch:92-177, 202 >>  67, 72, 102-285, 318 >> 73-258, 391 - 2h after lunch: 166, 222 >> 226 >> n/c - before dinner: 78-251, 290, 343  >> 62-256, 309 >> 72-253 - 2h after dinner:  173 >> n/c >> 183 >> n/c  - bedtime: 87-162 >> 94-195, 332 >> 63-178, 355, 371 - nighttime: 3 am: 52 >> 50 >> 41-67 >> n/c Lowest sugar was 49x1 >> 50's >> 57 >> 42 x1 at night; she has hypoglycemia awareness at 70s. No recent hypoglycemia admission. She does have a glucagon kit at home. Highest sugar was 300s >> 300s. No recent DKA admissions.    Pt's meals are: - Breakfast: protein bar + fruit - 10 am snack  -  Lunch: apple + cheese, carrots - Snack bar: 15g carbs - Glucerna - Dinner: meat + 1/2 backed potato + veggies/salad - Snacks: popcorn, salty snacks: potato chips  - No CKD, last BUN/creatinine:  Lab Results  Component Value Date   BUN 21 03/19/2017   CREATININE 0.99 03/19/2017  On Quinapril 5. - last set of lipids: Lab Results  Component Value Date   CHOL 159 07/30/2016   HDL 63.00 07/30/2016   LDLCALC 82 07/30/2016   TRIG 73.0 07/30/2016   CHOLHDL 3 07/30/2016  She was on: Red yeast rice + CoQ10.  In 04/2016, we started Pravastatin >> muscle cramps, but, before last visit, she also started to creams: Theraworx + Muscle calm. The cramps are much better. - last eye exam was in 09/2016 >> mild DR - denies numbness and tingling in her feet.  Last TSH: Lab Results  Component Value Date   TSH 2.06 03/04/2016   She also has HTN and HL.  ROS: Constitutional: no weight gain/no weight loss, no fatigue, no subjective hyperthermia, no subjective hypothermia Eyes: no blurry vision, no xerophthalmia ENT: no sore throat, no nodules palpated in throat, no dysphagia, no odynophagia,  no hoarseness Cardiovascular: no CP/no SOB/no palpitations/no leg swelling Respiratory: no cough/no SOB/no wheezing Gastrointestinal: no N/no V/no D/no C/no acid reflux Musculoskeletal: no muscle aches/no joint aches Skin: no rashes, no hair loss Neurological: no tremors/no numbness/no tingling/no dizziness  I reviewed pt's medications, allergies, PMH, social hx, family hx, and changes were documented in the history of present illness. Otherwise, unchanged from my initial visit note.  Past Medical History:  Diagnosis Date  . Allergy   . Anemia    past history long ago  . Disorder of kidney    has abd kidney (right)  . GERD (gastroesophageal reflux disease)   . History of diabetic gastroparesis   . HLD (hyperlipidemia)    borderline  . HTN (hypertension)    borderline  . Hx: UTI (urinary tract  infection)   . Hypertension   . IBS (irritable bowel syndrome)   . Nephrolithiasis 2005  . Osteopenia 2012   per pt  . Systolic murmur 05/3531   mild mitral insuff  . Type 1 diabetes mellitus with diabetic retinopathy without macular edema (HCC)    dx at 56 y/o, background retinopathy   Past Surgical History:  Procedure Laterality Date  . CESAREAN SECTION  1985  . COLONOSCOPY  07/2016   WNL (Pyrtle)  . ESOPHAGOGASTRODUODENOSCOPY  07/2016   LA Grade C reflux esophagitis, concern for stasis/dysmotility (Pyrtle)  . LAPAROSCOPIC APPENDECTOMY N/A 09/01/2015   Procedure: APPENDECTOMY LAPAROSCOPIC;  Surgeon: Judeth Horn, MD;  Location: [facility];  Service: General;  Laterality: N/A;  . ORIF FEMUR FRACTURE Left 1996   Corrected by patient in history 2016  . PARTIAL HYSTERECTOMY  2000   paps by [facility] [place]  . TONSILLECTOMY AND ADENOIDECTOMY  1975  . TRIGGER FINGER RELEASE Left 12/2010   thumb   Social History   Social History  . Marital status: Married    Spouse name: N/A  . Number of children: 1  . Years of education: N/A   Occupational History  . HR director Other    [place] History Main Topics  . Smoking status: Never Smoker  . Smokeless tobacco: Never Used  . Alcohol use No  . Drug use: No  . Sexual activity: Not on file   Other Topics Concern  . 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 aspirin 81 MG tablet Take 81 mg by mouth daily.      Marland Kitchen azithromycin (ZITHROMAX) 250 MG tablet Take two tablets on day one followed by one tablet on days 2-5 6 each 0  . BD INSULIN SYRINGE ULTRAFINE 31G X 15/64" 0.3 ML MISC USE TO INJECT INSULIN 6 TIMES PER DAY 200 each 1  . glucagon (GLUCAGON EMERGENCY) 1 MG injection Inject 1 mg into the muscle once as  needed (in case of severe hypoglycemia). 1 each 12  . Insulin Pen Needle (FIFTY50 PEN NEEDLES) 32G X 4 MM MISC Use 4x a day 200 each 11  . Multiple Vitamin (MULTIVITAMIN) tablet Take 1 tablet by mouth daily.      Marland Kitchen NOVOLIN R 100 UNIT/ML injection INJECT 6 UNITS INTO THE SKIN 3 TIMES A DAY BEFORE MEALS BASED ON SLIDING SCALE AND CARBS 10 mL 2  . pravastatin (PRAVACHOL)  40 MG tablet TAKE 1 TABLET (40 MG TOTAL) BY MOUTH DAILY. 90 tablet 3  . quinapril (ACCUPRIL) 5 MG tablet Take 1 tablet (5 mg total) by mouth daily. 30 tablet 3  . TRESIBA FLEXTOUCH 100 UNIT/ML SOPN FlexTouch Pen INJECT 12 UNITS INTO THE SKIN DAILY BEFORE BREAKFAST. (Patient taking differently: Inject 10 Units into the skin daily before breakfast. ) 15 pen 3   Current Facility-Administered Medications on File Prior to Visit  Medication Dose Route Frequency Provider Last Rate Last Dose  . 0.9 %  sodium chloride infusion  500 mL Intravenous Continuous Pyrtle, Lajuan Lines, MD       Allergies  Allergen Reactions  . Protonix [Pantoprazole] Swelling    Facial Swelling  . Toujeo Solostar [Insulin Glargine] Shortness Of Breath  . Lactose Intolerance (Gi)   . Magnesium-Containing Compounds Swelling   Family History  Problem Relation Age of Onset  . Diabetes Maternal Grandmother   . Heart failure Maternal Grandmother   . COPD Maternal Grandmother        smoker  . Thyroid disease Maternal Grandmother   . Vascular Disease Maternal Grandmother   . Colon cancer Maternal Grandfather   . Colon polyps Mother   . Breast cancer Other   . Esophageal cancer Neg Hx   . Rectal cancer Neg Hx   . Stomach cancer Neg Hx    PE: BP 118/60 (BP Location: Left Arm, Patient Position: Sitting)   Pulse 83   Ht '5\' 4"'$  (1.626 m)   Wt 130 lb (59 kg)   SpO2 98%   BMI 22.31 kg/m  Body mass index is 22.31 kg/m.  Wt Readings from Last 3 Encounters:  08/14/17 130 lb (59 kg)  07/02/17 127 lb 12.8 oz (58 kg)  06/30/17 128 lb 12.8 oz (58.4 kg)    Constitutional: normal weight, in NAD Eyes: PERRLA, EOMI, no exophthalmos ENT: moist mucous membranes, no thyromegaly, no cervical lymphadenopathy Cardiovascular: RRR, No MRG Respiratory: CTA B Gastrointestinal: abdomen soft, NT, ND, BS+ Musculoskeletal: no deformities, strength intact in all 4 Skin: moist, warm, no rashes Neurological: no tremor with outstretched hands, DTR normal in all 4  ASSESSMENT: 1. DM1, uncontrolled, with complications - Mild DR - Gastroparesis - This is the reason why she is on regular insulin, and she is taking it 10 minutes  rather than 30 minutes before eating  She had low CBGs with Novolog!  2. HL  PLAN:  1. Patient with long-standing, uncontrolled type 1 diabetes on basal- bolus insulin therapy, with still fluctuating CBGs throughout the day. Sugars are higher in am as she has to take the [place] in am 2/2 lows at night. We tried to split the dose >> did not help, as expected. Will increase Tresiba dose at this visit to avoid hyperglycemia in am, but will decrease the insulin with dinner to avoid lows at night. - discussed about the Freestyle Libre CGM >> refuses one - I suggested to:  Patient Instructions  Please change: - Tresiba to 12 units in am - R insulin: - insulin to carb ratio (ICR)   B'fast: 20:1  Lunch: 20:1   Dinner: 20:1 >> 15:1 - target 140  - insulin sensitivity factor (ISF) 60: 140-200: + 1 unit 201-260: + 2 units 261-320: + 3 units 321-380: + 4 units >380: + 5 units Do not correct bedtime sugars <300, and only inject 1 unit then.   Please return in 3-4 months with your sugar log.   - today, HbA1c  is 7.8% (higher) - continue checking sugars at different times of the day - check 3-4x a day, rotating checks - advised for yearly eye exams >> she is UTD - Return to clinic in 3 mo with sugar log    2. HL - On Pravastatin 40 mg daily. Had leg cramps >> improved with the creams described in HPI - latest lipid panel  reviewed >> improved on statin - will need to check a new lipid panel >> she would like to postpone until next visit  Philemon Kingdom, MD PhD [facility]

## 2017-08-14 NOTE — Patient Instructions (Addendum)
Please change: - Tresiba to 12 units in am - R insulin: - insulin to carb ratio (ICR)   B'fast: 20:1  Lunch: 20:1   Dinner: 20:1 >> 15:1 - target 140  - insulin sensitivity factor (ISF) 60: 140-200: + 1 unit 201-260: + 2 units 261-320: + 3 units 321-380: + 4 units >380: + 5 units Do not correct bedtime sugars <300, and only inject 1 unit then.   Please return in 3-4 months with your sugar log.

## 2017-09-03 ENCOUNTER — Telehealth: Payer: Self-pay | Admitting: Internal Medicine

## 2017-09-03 ENCOUNTER — Other Ambulatory Visit: Payer: Self-pay | Admitting: Internal Medicine

## 2017-09-03 DIAGNOSIS — E785 Hyperlipidemia, unspecified: Secondary | ICD-10-CM

## 2017-09-03 DIAGNOSIS — E103299 Type 1 diabetes mellitus with mild nonproliferative diabetic retinopathy without macular edema, unspecified eye: Secondary | ICD-10-CM

## 2017-09-03 NOTE — Telephone Encounter (Signed)
Called and scheduled lab appointment with patient at the end of November.

## 2017-09-03 NOTE — Telephone Encounter (Signed)
OK, I ordered them.

## 2017-09-03 NOTE — Telephone Encounter (Signed)
Please advise. Thank you

## 2017-09-03 NOTE — Telephone Encounter (Signed)
Patient would like to get her labs done by end of November due to insurance issues. She had previously told Dr. Elvera LennoxGherghe to wait on Lab orders but is getting new insurance in December and would like to get her labs done before that

## 2017-09-04 ENCOUNTER — Encounter: Payer: Self-pay | Admitting: Internal Medicine

## 2017-09-04 ENCOUNTER — Ambulatory Visit (INDEPENDENT_AMBULATORY_CARE_PROVIDER_SITE_OTHER): Payer: BLUE CROSS/BLUE SHIELD | Admitting: Internal Medicine

## 2017-09-04 VITALS — BP 114/78 | HR 79 | Temp 97.8°F | Wt 131.5 lb

## 2017-09-04 DIAGNOSIS — L299 Pruritus, unspecified: Secondary | ICD-10-CM

## 2017-09-04 DIAGNOSIS — K580 Irritable bowel syndrome with diarrhea: Secondary | ICD-10-CM | POA: Diagnosis not present

## 2017-09-04 NOTE — Patient Instructions (Signed)
Pruritus  Pruritus is an itching feeling. There are many different conditions and factors that can make your skin itchy. Dry skin is one of the most common causes of itching. Most cases of itching do not require medical attention. Itchy skin can turn into a rash.  Follow these instructions at home:  Watch your pruritus for any changes. Take these steps to help with your condition:  Skin Care  · Moisturize your skin as needed. A moisturizer that contains petroleum jelly is best for keeping moisture in your skin.  · Take or apply medicines only as directed by your health care provider. This may include:  ? Corticosteroid cream.  ? Anti-itch lotions.  ? Oral anti-histamines.  · Apply cool compresses to the affected areas.  · Try taking a bath with:  ? Epsom salts. Follow the instructions on the packaging. You can get these at your local pharmacy or grocery store.  ? Baking soda. Pour a small amount into the bath as directed by your health care provider.  ? Colloidal oatmeal. Follow the instructions on the packaging. You can get this at your local pharmacy or grocery store.  · Try applying baking soda paste to your skin. Stir water into baking soda until it reaches a paste-like consistency.  · Do not scratch your skin.  · Avoid hot showers or baths, which can make itching worse. A cold shower may help with itching as long as you use a moisturizer after.  · Avoid scented soaps, detergents, and perfumes. Use gentle soaps, detergents, perfumes, and other cosmetic products.  General instructions  · Avoid wearing tight clothes.  · Keep a journal to help track what causes your itch. Write down:  ? What you eat.  ? What cosmetic products you use.  ? What you drink.  ? What you wear. This includes jewelry.  · Use a humidifier. This keeps the air moist, which helps to prevent dry skin.  Contact a health care provider if:  · The itching does not go away after several days.  · You sweat at night.  · You have weight loss.  · You  are unusually thirsty.  · You urinate more than normal.  · You are more tired than normal.  · You have abdominal pain.  · Your skin tingles.  · You feel weak.  · Your skin or the whites of your eyes look yellow (jaundice).  · Your skin feels numb.  This information is not intended to replace advice given to you by your health care provider. Make sure you discuss any questions you have with your health care provider.  Document Released: 07/03/2011 Document Revised: 03/28/2016 Document Reviewed: 10/17/2014  Elsevier Interactive Patient Education © 2018 Elsevier Inc.

## 2017-09-04 NOTE — Progress Notes (Signed)
Subjective:    Patient ID: Felicia [name], female    DOB: [birth year], 56 y.o.   MRN: [id number]  HPI  Pt presents to the clinic today with c/o loose stools. She reports this started 1 week ago. She denies blood in her stool. It has improvement Imodium She has a history of IBS. She has a normal colonoscopy 07/2016.  She also reports abdominal itching. This also started 1 week ago. She has not noticed any rashes. She denies changes in soaps, lotions or detergents. She denies changes in her diet or medications.  She has tried Benadryl and Hydrocortisone cream without any relief.  Review of Systems      Past Medical History:  Diagnosis Date  . Allergy   . Anemia    past history long ago  . Disorder of kidney    has abd kidney (right)  . GERD (gastroesophageal reflux disease)   . History of diabetic gastroparesis   . HLD (hyperlipidemia)    borderline  . HTN (hypertension)    borderline  . Hx: UTI (urinary tract infection)   . Hypertension   . IBS (irritable bowel syndrome)   . Nephrolithiasis 2005  . Osteopenia 2012   per pt  . Systolic murmur 01/2013   mild mitral insuff  . Type 1 diabetes mellitus with diabetic retinopathy without macular edema (HCC)    dx at 56 y/o, background retinopathy    Current Outpatient Prescriptions  Medication Sig Dispense Refill  . aspirin 81 MG tablet Take 81 mg by mouth daily.      . BD INSULIN SYRINGE ULTRAFINE 31G X 15/64" 0.3 ML MISC USE TO INJECT INSULIN 6 TIMES PER DAY 200 each 1  . glucagon (GLUCAGON EMERGENCY) 1 MG injection Inject 1 mg into the muscle once as needed (in case of severe hypoglycemia). 1 each 12  . Insulin Pen Needle (FIFTY50 PEN NEEDLES) 32G X 4 MM MISC Use 4x a day 200 each 11  . Multiple Vitamin (MULTIVITAMIN) tablet Take 1 tablet by mouth daily.      Marland Kitchen NOVOLIN R 100 UNIT/ML injection INJECT 6 UNITS INTO THE SKIN 3 TIMES A DAY BEFORE MEALS BASED ON SLIDING SCALE AND CARBS 10 mL 2  . pravastatin (PRAVACHOL) 40 MG  tablet TAKE 1 TABLET (40 MG TOTAL) BY MOUTH DAILY. 90 tablet 3  . quinapril (ACCUPRIL) 5 MG tablet Take 1 tablet (5 mg total) by mouth daily. 30 tablet 3  . TRESIBA FLEXTOUCH 100 UNIT/ML SOPN FlexTouch Pen INJECT 12 UNITS INTO THE SKIN DAILY BEFORE BREAKFAST. (Patient taking differently: Inject 10 Units into the skin daily before breakfast. ) 15 pen 3   Current Facility-Administered Medications  Medication Dose Route Frequency Provider Last Rate Last Dose  . 0.9 %  sodium chloride infusion  500 mL Intravenous Continuous Pyrtle, Carie Caddy, MD        Allergies  Allergen Reactions  . Protonix [Pantoprazole] Swelling    Facial Swelling  . Toujeo Solostar [Insulin Glargine] Shortness Of Breath  . Lactose Intolerance (Gi)   . Magnesium-Containing Compounds Swelling    Family History  Problem Relation Age of Onset  . Diabetes Maternal Grandmother   . Heart failure Maternal Grandmother   . COPD Maternal Grandmother        smoker  . Thyroid disease Maternal Grandmother   . Vascular Disease Maternal Grandmother   . Colon cancer Maternal Grandfather   . Colon polyps Mother   . Breast cancer Other   .  Esophageal cancer Neg Hx   . Rectal cancer Neg Hx   . Stomach cancer Neg Hx     Social History   Social History  . Marital status: Married    Spouse name: N/A  . Number of children: 1  . Years of education: N/A   Occupational History  . HR director Other    [employer]   Social History Main Topics  . Smoking status: Never Smoker  . Smokeless tobacco: Never Used  . Alcohol use No  . Drug use: No  . Sexual activity: Not on file   Other Topics Concern  . 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  Gastrointestinal: Pt reports loose stools. Denies abdominal pain, bloating,  constipation, diarrhea or blood in the stool.  GU: Denies urgency, frequency, pain with urination, burning sensation, blood in urine, odor or discharge. Skin: Pt reports itching of abdomen. Denies redness, rashes, lesions or ulcercations.    No other specific complaints in a complete review of systems (except as listed in HPI above).  Objective:   Physical Exam    BP 114/78   Pulse 79   Temp 97.8 F (36.6 C) (Oral)   Wt 131 lb 8 oz (59.6 kg)   SpO2 99%   BMI 22.57 kg/m  Wt Readings from Last 3 Encounters:  09/04/17 131 lb 8 oz (59.6 kg)  08/14/17 130 lb (59 kg)  07/02/17 127 lb 12.8 oz (58 kg)    General: Appears her stated age, well developed, well nourished in NAD. Skin: Warm, dry and intact. No rashes noted. Abdomen: Soft and nontender. Normal bowel sounds. No distention or masses noted.   BMET    Component Value Date/Time   NA 136 03/19/2017 1818   K 5.0 03/19/2017 1818   CL 101 03/19/2017 1818   CO2 32 03/19/2017 1818   GLUCOSE 353 (H) 03/19/2017 1818   BUN 21 03/19/2017 1818   CREATININE 0.99 03/19/2017 1818   CREATININE 0.90 07/03/2011 1107   CALCIUM 9.5 03/19/2017 1818   GFRNONAA >60 09/01/2015 1835   GFRAA >60 09/01/2015 1835    Lipid Panel     Component Value Date/Time   CHOL 159 07/30/2016 1631   TRIG 73.0 07/30/2016 1631   HDL 63.00 07/30/2016 1631   CHOLHDL 3 07/30/2016 1631   VLDL 14.6 07/30/2016 1631   LDLCALC 82 07/30/2016 1631    CBC    Component Value Date/Time   WBC 5.2 03/19/2017 1818   RBC 4.06 03/19/2017 1818   HGB 12.1 03/19/2017 1818   HCT 36.0 03/19/2017 1818   PLT 261.0 03/19/2017 1818   MCV 88.6 03/19/2017 1818   MCH 29.2 09/01/2015 1835   MCHC 33.6 03/19/2017 1818   RDW 13.4 03/19/2017 1818   LYMPHSABS 1.6 03/19/2017 1818   MONOABS 0.5 03/19/2017 1818   EOSABS 0.3 03/19/2017 1818   BASOSABS 0.1 03/19/2017 1818    Hgb A1C Lab Results  Component Value Date   HGBA1C 7.8 08/14/2017          Assessment & Plan:    Itching of Abdomen:  She is requesting oral steroids, I declined but will give 80 mg of Depo Medrol today- monitor sugars Advised her to try Zyrtec and Zantac OTC for  itching If no improvement, can try Hydroxyzine  IBS, Diarrhea:  Improved No further treatment needed at this time  Return precautions discussed Felicia [name], Felicia Ambriz, NP

## 2017-09-17 ENCOUNTER — Encounter: Payer: Self-pay | Admitting: Family Medicine

## 2017-09-17 ENCOUNTER — Ambulatory Visit: Payer: BLUE CROSS/BLUE SHIELD | Admitting: Family Medicine

## 2017-09-17 VITALS — BP 118/70 | HR 81 | Temp 97.8°F | Wt 131.0 lb

## 2017-09-17 DIAGNOSIS — Z1159 Encounter for screening for other viral diseases: Secondary | ICD-10-CM | POA: Diagnosis not present

## 2017-09-17 DIAGNOSIS — E103299 Type 1 diabetes mellitus with mild nonproliferative diabetic retinopathy without macular edema, unspecified eye: Secondary | ICD-10-CM

## 2017-09-17 DIAGNOSIS — I10 Essential (primary) hypertension: Secondary | ICD-10-CM

## 2017-09-17 DIAGNOSIS — E785 Hyperlipidemia, unspecified: Secondary | ICD-10-CM | POA: Diagnosis not present

## 2017-09-17 LAB — LIPID PANEL
Cholesterol: 169 mg/dL (ref 0–200)
HDL: 78 mg/dL (ref >39.00)
LDL Cholesterol: 76 mg/dL (ref 0–99)
NonHDL: 90.7
Total CHOL/HDL Ratio: 2
Triglycerides: 74 mg/dL (ref 0.0–149.0)
VLDL: 14.8 mg/dL (ref 0.0–40.0)

## 2017-09-17 LAB — MICROALBUMIN / CREATININE URINE RATIO
Creatinine,U: 38.5 mg/dL
Microalb Creat Ratio: 1.8 mg/g (ref 0.0–30.0)
Microalb, Ur: <0.7 mg/dL (ref 0.0–1.9)

## 2017-09-17 LAB — TSH: TSH: 2.21 u[IU]/mL (ref 0.35–4.50)

## 2017-09-17 MED ORDER — QUINAPRIL HCL 5 MG PO TABS: 5.0000 mg | ORAL_TABLET | Freq: Every day | ORAL | 3 refills | Status: DC

## 2017-09-17 NOTE — Progress Notes (Signed)
BP 118/70 (BP Location: Left Arm, Patient Position: Sitting, Cuff Size: Normal)   Pulse 81   Temp 97.8 F (36.6 C) (Oral)   Wt 131 lb (59.4 kg)   SpO2 100%   BMI 22.49 kg/m   BP Readings from Last 3 Encounters:  09/17/17 118/70  09/04/17 114/78  08/14/17 118/60    CC: med refill visit Subjective:    Patient ID: Felicia [name], female    DOB: Apr 12, 1961, 56 y.o.   MRN: [id number]  HPI: Felicia [name] is a 56 y.o. female presenting on 09/17/2017 for Medication Refill (BP meds.)   DM - followed by endo. Requests labs that Dr Elvera LennoxGherghe has ordered drawn today. She is fasting today.  Lab Results  Component Value Date   HGBA1C 7.8 08/14/2017     HTN - Compliant with current antihypertensive regimen of quinapril 5mg  daily.  Does not check blood pressures at home. No low blood pressure readings or symptoms of dizziness/syncope. Denies HA, vision changes, CP/tightness, SOB, leg swelling.    Sees OBGYN for well woman exam. Will return here for physical.   Relevant past medical, surgical, family and social history reviewed and updated as indicated. Interim medical history since our last visit reviewed. Allergies and medications reviewed and updated. Outpatient Medications Prior to Visit  Medication Sig Dispense Refill  . aspirin 81 MG tablet Take 81 mg by mouth daily.      . BD INSULIN SYRINGE ULTRAFINE 31G X 15/64" 0.3 ML MISC USE TO INJECT INSULIN 6 TIMES PER DAY 200 each 1  . glucagon (GLUCAGON EMERGENCY) 1 MG injection Inject 1 mg into the muscle once as needed (in case of severe hypoglycemia). 1 each 12  . Insulin Pen Needle (FIFTY50 PEN NEEDLES) 32G X 4 MM MISC Use 4x a day 200 each 11  . Multiple Vitamin (MULTIVITAMIN) tablet Take 1 tablet by mouth daily.      Marland Kitchen. NOVOLIN R 100 UNIT/ML injection INJECT 6 UNITS INTO THE SKIN 3 TIMES A DAY BEFORE MEALS BASED ON SLIDING SCALE AND CARBS 10 mL 2  . pravastatin (PRAVACHOL) 40 MG tablet TAKE 1 TABLET (40 MG TOTAL) BY MOUTH DAILY. 90  tablet 3  . TRESIBA FLEXTOUCH 100 UNIT/ML SOPN FlexTouch Pen INJECT 12 UNITS INTO THE SKIN DAILY BEFORE BREAKFAST. (Patient taking differently: Inject 10 Units into the skin daily before breakfast. ) 15 pen 3  . quinapril (ACCUPRIL) 5 MG tablet Take 1 tablet (5 mg total) by mouth daily. 30 tablet 3   Facility-Administered Medications Prior to Visit  Medication Dose Route Frequency Provider Last Rate Last Dose  . 0.9 %  sodium chloride infusion  500 mL Intravenous Continuous Pyrtle, Carie CaddyJay M, MD         Per HPI unless specifically indicated in ROS section below Review of Systems     Objective:    BP 118/70 (BP Location: Left Arm, Patient Position: Sitting, Cuff Size: Normal)   Pulse 81   Temp 97.8 F (36.6 C) (Oral)   Wt 131 lb (59.4 kg)   SpO2 100%   BMI 22.49 kg/m   Wt Readings from Last 3 Encounters:  09/17/17 131 lb (59.4 kg)  09/04/17 131 lb 8 oz (59.6 kg)  08/14/17 130 lb (59 kg)    Physical Exam  Constitutional: She appears well-developed and well-nourished. No distress.  HENT:  Mouth/Throat: Oropharynx is clear and moist. No oropharyngeal exudate.  Cardiovascular: Normal rate, regular rhythm, normal heart sounds and intact distal pulses.  No murmur heard. Pulmonary/Chest: Effort normal and breath sounds normal. No respiratory distress. She has no wheezes. She has no rales.  Musculoskeletal: She exhibits no edema.  Skin: Skin is warm and dry. No rash noted.  Psychiatric: She has a normal mood and affect.  Nursing note and vitals reviewed.  Results for orders placed or performed in visit on 08/14/17  POCT HgB A1C  Result Value Ref Range   Hemoglobin A1C 7.8    Lab Results  Component Value Date   CHOL 159 07/30/2016   HDL 63.00 07/30/2016   LDLCALC 82 07/30/2016   TRIG 73.0 07/30/2016   CHOLHDL 3 07/30/2016       Assessment & Plan:   Problem List Items Addressed This Visit    Essential hypertension - Primary    Chronic, stable. Continue quinapril.        Relevant Medications   quinapril (ACCUPRIL) 5 MG tablet   Hyperlipidemia    Pending FLP - on pravastatin 40mg  daily.       Relevant Medications   quinapril (ACCUPRIL) 5 MG tablet   Type 1 diabetes mellitus with background diabetic retinopathy (HCC)    Appreciate endo care. Labs drawn today.       Relevant Medications   quinapril (ACCUPRIL) 5 MG tablet    Other Visit Diagnoses    Need for hepatitis C screening test       Relevant Orders   Hepatitis C antibody       Follow up plan: Return in about 3 months (around 12/18/2017) for annual exam, prior fasting for blood work.  Eustaquio BoydenJavier Antonisha Waskey, MD

## 2017-09-17 NOTE — Assessment & Plan Note (Signed)
Appreciate endo care. Labs drawn today.

## 2017-09-17 NOTE — Patient Instructions (Signed)
Good to see you today! You are doing well  Labs today.  Return as needed or in 3 months for physical.

## 2017-09-17 NOTE — Assessment & Plan Note (Signed)
Pending FLP - on pravastatin 40mg  daily.

## 2017-09-17 NOTE — Assessment & Plan Note (Signed)
Chronic, stable. Continue quinapril.

## 2017-09-18 LAB — COMPLETE METABOLIC PANEL WITH GFR
AG Ratio: 1.5 (calc) (ref 1.0–2.5)
ALT: 17 U/L (ref 6–29)
AST: 17 U/L (ref 10–35)
Albumin: 3.8 g/dL (ref 3.6–5.1)
Alkaline phosphatase (APISO): 70 U/L (ref 33–130)
BUN: 23 mg/dL (ref 7–25)
CO2: 26 mmol/L (ref 20–32)
Calcium: 9.2 mg/dL (ref 8.6–10.4)
Chloride: 100 mmol/L (ref 98–110)
Creat: 0.92 mg/dL (ref 0.50–1.05)
GFR, Est African American: 82 mL/min/{1.73_m2} (ref >=60)
GFR, Est Non African American: 71 mL/min/{1.73_m2} (ref >=60)
Globulin: 2.6 g/dL (calc) (ref 1.9–3.7)
Glucose, Bld: 395 mg/dL — ABNORMAL HIGH (ref 65–99)
Potassium: 6.1 mmol/L — ABNORMAL HIGH (ref 3.5–5.3)
Sodium: 135 mmol/L (ref 135–146)
Total Bilirubin: 0.4 mg/dL (ref 0.2–1.2)
Total Protein: 6.4 g/dL (ref 6.1–8.1)

## 2017-09-18 LAB — HEPATITIS C ANTIBODY
Hepatitis C Ab: NONREACTIVE
SIGNAL TO CUT-OFF: 0.01 (ref <1.00)

## 2017-09-19 ENCOUNTER — Other Ambulatory Visit: Payer: Self-pay | Admitting: Family Medicine

## 2017-09-19 DIAGNOSIS — E875 Hyperkalemia: Secondary | ICD-10-CM

## 2017-09-24 ENCOUNTER — Other Ambulatory Visit: Payer: BLUE CROSS/BLUE SHIELD

## 2017-09-28 ENCOUNTER — Other Ambulatory Visit: Payer: Self-pay | Admitting: Internal Medicine

## 2017-10-19 DIAGNOSIS — J0140 Acute pansinusitis, unspecified: Secondary | ICD-10-CM | POA: Diagnosis not present

## 2017-11-06 DIAGNOSIS — E103293 Type 1 diabetes mellitus with mild nonproliferative diabetic retinopathy without macular edema, bilateral: Secondary | ICD-10-CM | POA: Diagnosis not present

## 2017-11-19 ENCOUNTER — Ambulatory Visit: Payer: BLUE CROSS/BLUE SHIELD | Admitting: Family Medicine

## 2017-11-19 ENCOUNTER — Encounter: Payer: Self-pay | Admitting: Family Medicine

## 2017-11-19 VITALS — BP 120/66 | HR 76 | Temp 97.9°F | Wt 131.5 lb

## 2017-11-19 DIAGNOSIS — M79661 Pain in right lower leg: Secondary | ICD-10-CM | POA: Diagnosis not present

## 2017-11-19 DIAGNOSIS — R252 Cramp and spasm: Secondary | ICD-10-CM

## 2017-11-19 DIAGNOSIS — E103299 Type 1 diabetes mellitus with mild nonproliferative diabetic retinopathy without macular edema, unspecified eye: Secondary | ICD-10-CM | POA: Diagnosis not present

## 2017-11-19 DIAGNOSIS — M79662 Pain in left lower leg: Secondary | ICD-10-CM | POA: Diagnosis not present

## 2017-11-19 DIAGNOSIS — I10 Essential (primary) hypertension: Secondary | ICD-10-CM

## 2017-11-19 DIAGNOSIS — E785 Hyperlipidemia, unspecified: Secondary | ICD-10-CM

## 2017-11-19 LAB — CBC WITH DIFFERENTIAL/PLATELET
Basophils Absolute: 0.1 10*3/uL (ref 0.0–0.1)
Basophils Relative: 1 % (ref 0.0–3.0)
Eosinophils Absolute: 0.3 10*3/uL (ref 0.0–0.7)
Eosinophils Relative: 4.7 % (ref 0.0–5.0)
HCT: 38.7 % (ref 36.0–46.0)
Hemoglobin: 12.8 g/dL (ref 12.0–15.0)
Lymphocytes Relative: 35 % (ref 12.0–46.0)
Lymphs Abs: 2.2 10*3/uL (ref 0.7–4.0)
MCHC: 33.1 g/dL (ref 30.0–36.0)
MCV: 90.5 fl (ref 78.0–100.0)
Monocytes Absolute: 0.4 10*3/uL (ref 0.1–1.0)
Monocytes Relative: 6.7 % (ref 3.0–12.0)
Neutro Abs: 3.2 10*3/uL (ref 1.4–7.7)
Neutrophils Relative %: 52.6 % (ref 43.0–77.0)
Platelets: 317 10*3/uL (ref 150.0–400.0)
RBC: 4.27 Mil/uL (ref 3.87–5.11)
RDW: 14.7 % (ref 11.5–15.5)
WBC: 6.1 10*3/uL (ref 4.0–10.5)

## 2017-11-19 LAB — IBC PANEL
Iron: 54 ug/dL (ref 42–145)
Saturation Ratios: 16.3 % — ABNORMAL LOW (ref 20.0–50.0)
Transferrin: 237 mg/dL (ref 212.0–360.0)

## 2017-11-19 LAB — BASIC METABOLIC PANEL
BUN: 21 mg/dL (ref 6–23)
CO2: 33 mEq/L — ABNORMAL HIGH (ref 19–32)
Calcium: 9.8 mg/dL (ref 8.4–10.5)
Chloride: 100 mEq/L (ref 96–112)
Creatinine, Ser: 0.93 mg/dL (ref 0.40–1.20)
GFR: 66.59 mL/min (ref >60.00)
Glucose, Bld: 197 mg/dL — ABNORMAL HIGH (ref 70–99)
Potassium: 4.9 mEq/L (ref 3.5–5.1)
Sodium: 140 mEq/L (ref 135–145)

## 2017-11-19 LAB — CK: Total CK: 65 U/L (ref 7–177)

## 2017-11-19 LAB — SEDIMENTATION RATE: Sed Rate: 17 mm/hr (ref 0–30)

## 2017-11-19 LAB — FERRITIN: Ferritin: 30.7 ng/mL (ref 10.0–291.0)

## 2017-11-19 LAB — MAGNESIUM: Magnesium: 1.9 mg/dL (ref 1.5–2.5)

## 2017-11-19 MED ORDER — CYCLOBENZAPRINE HCL 5 MG PO TABS: 5.0000 mg | ORAL_TABLET | Freq: Two times a day (BID) | ORAL | 1 refills | Status: DC | PRN

## 2017-11-19 NOTE — Assessment & Plan Note (Addendum)
Calf soreness when active, cramping with rest. Longstanding and frustrating to patient.  Has had extensive rheumatologic, orthopedic, and vascular evaluation. ABIs normal 2017. Reviewed work up to date. Will recommend several other empiric treatment trials as outlined below. Will check further labwork today to include ESR, CPK, lytes, iron panel, ANA. Will trial flexeril PRN leg pain.  1. ?med related - will trial off ACEI, off statin x 1 month and update with effect. If no better, will restart meds and  2. Will empirically treat for RLS. If no better, 3. Will consider neurology referral for EMG.  4. Consider effexor trial.

## 2017-11-19 NOTE — Patient Instructions (Addendum)
Labs today.  Stop pravastatin and quinapril for 1 month. Monitor blood pressures off this medicine.  If not better we will treat for possible restless leg syndrome.  Trial low dose flexeril muscle relaxant as needed.  If no better with this we may refer you to neurologist to discuss possible muscle function evaluation (electromyelogram).

## 2017-11-19 NOTE — Progress Notes (Signed)
BP 120/66 (BP Location: Left Arm, Patient Position: Sitting, Cuff Size: Normal)   Pulse 76   Temp 97.9 F (36.6 C) (Oral)   Wt 131 lb 8 oz (59.6 kg)   SpO2 100%   BMI 22.57 kg/m    CC: bilat leg pain  Subjective:    Patient ID: Felicia [name], female    DOB: 1960-12-24, 57 y.o.   MRN: [id number]  HPI: Felicia [name] is a 57 y.o. female presenting on 11/19/2017 for Leg Pain (Bilateral. Radiates into feet, which started about 4 days ago. Cramping is worse in frequency and intensity. )   See prior notes for details. Longstanding problem with leg pain (>10 yrs) described as soreness with activity,cramping charlie horse sensation at rest, R>L. Prior swelling has resolved. Has seen vascular surgery - rec compression stockings and leg and foot of bed elevation. Compression stockings worsened pain. Holding RYR did not help. CT abd/pelvis stable 08/2015. [place] negative for DVT 2017. Rheum eval unrevealing for leg cramping. Advised OTC quinine (did not try), vit E (didn't help), magnesium (did not tolerate due to nausea). She has seen orthopedist - PT didn't help.   Having hip problems as well - sees ortho for this. H/o R frozen shoulder as well. Some R leg sciatica symptoms separate from above.    Has never tried off quinapril - longstanding medication.  Here today frustrated with ongoing chronic leg soreness and cramping pain, this has progressed to bilateral legs - now cramps moving into feet (specifically L middle toe). Still worse at night but significant symptoms also during the day. Happening every day. Wakes her up at 3-4am every morning. "Charlie horse" cramping pain. Predominant pain at bilateral calves, with radiation down legs. L middle toe has also cramped. No joint inolvement. Currently sore. Staying active and in motion helps. More pronounce when she stops and sits down and rests. Better when she lays down, prone, with legs hanging off the bed. Has been to point of going to ER due to pain  several times.    Denies fevers/chills, no recent travel, no nausea, vomiting, abd pain, diarrhea, no leg redness or swelling or warmth, no arthralgias. No numbness or weakness of legs. No upper extremity pains. Sometimes R hand goes numb. No mental fogginess. No inciting trauma/falls/injury. Not activity limiting - able to do everything she needs to do. Severe pain does occur however and she is frustrated with duration and lack of answers.   Relevant past medical, surgical, family and social history reviewed and updated as indicated. Interim medical history since our last visit reviewed. Allergies and medications reviewed and updated. Outpatient Medications Prior to Visit  Medication Sig Dispense Refill  . aspirin 81 MG tablet Take 81 mg by mouth daily.      . BD INSULIN SYRINGE ULTRAFINE 31G X 15/64" 0.3 ML MISC USE TO INJECT INSULIN 6 TIMES PER DAY 200 each 1  . glucagon (GLUCAGON EMERGENCY) 1 MG injection Inject 1 mg into the muscle once as needed (in case of severe hypoglycemia). 1 each 12  . Insulin Pen Needle (NOVOFINE) 32G X 6 MM MISC USE TO INJECT INSULIN 4 TIMES A DAY 200 each 0  . Multiple Vitamin (MULTIVITAMIN) tablet Take 1 tablet by mouth daily.      Marland Kitchen NOVOLIN R 100 UNIT/ML injection INJECT 6 UNITS INTO THE SKIN 3 TIMES A DAY BEFORE MEALS BASED ON SLIDING SCALE AND CARBS 10 mL 2  . pravastatin (PRAVACHOL) 40 MG tablet TAKE 1  TABLET (40 MG TOTAL) BY MOUTH DAILY. 90 tablet 3  . quinapril (ACCUPRIL) 5 MG tablet Take 1 tablet (5 mg total) daily by mouth. 90 tablet 3  . TRESIBA FLEXTOUCH 100 UNIT/ML SOPN FlexTouch Pen INJECT 12 UNITS INTO THE SKIN DAILY BEFORE BREAKFAST. (Patient taking differently: Inject 10 Units into the skin daily before breakfast. ) 15 pen 3  . esomeprazole (NEXIUM) 40 MG capsule Take 1 capsule by mouth daily.  6   Facility-Administered Medications Prior to Visit  Medication Dose Route Frequency Provider Last Rate Last Dose  . 0.9 %  sodium chloride infusion  500  mL Intravenous Continuous Pyrtle, Lajuan Lines, MD         Per HPI unless specifically indicated in ROS section below Review of Systems     Objective:    BP 120/66 (BP Location: Left Arm, Patient Position: Sitting, Cuff Size: Normal)   Pulse 76   Temp 97.9 F (36.6 C) (Oral)   Wt 131 lb 8 oz (59.6 kg)   SpO2 100%   BMI 22.57 kg/m   Wt Readings from Last 3 Encounters:  11/19/17 131 lb 8 oz (59.6 kg)  09/17/17 131 lb (59.4 kg)  09/04/17 131 lb 8 oz (59.6 kg)    Physical Exam  Constitutional: She is oriented to person, place, and time. She appears well-developed and well-nourished. No distress.  Musculoskeletal: Normal range of motion. She exhibits no edema.       Right hip: Normal.       Left hip: Normal.       Right knee: Normal.       Left knee: Normal.       Right ankle: Normal.       Left ankle: Normal.       Right lower leg: She exhibits tenderness. She exhibits no swelling and no edema.       Left lower leg: She exhibits tenderness. She exhibits no swelling and no edema.  Bilateral calf tenderness to palpation, worse with leg extension, without erythema or warmth or swelling noted today. No popliteal fullness No pain at head of fibula No bony pain to palpation No ankle ligament laxity, no pain with calcaneal squeeze  Neurological: She is alert and oriented to person, place, and time. She has normal strength. No sensory deficit.  5/5 strength BLE  Skin: Skin is warm. No rash noted. No erythema.  Psychiatric: She has a normal mood and affect.  Nursing note and vitals reviewed.  Results for orders placed or performed in visit on 09/17/17  TSH  Result Value Ref Range   TSH 2.21 0.35 - 4.50 uIU/mL  Lipid panel  Result Value Ref Range   Cholesterol 169 0 - 200 mg/dL   Triglycerides 74.0 0.0 - 149.0 mg/dL   HDL 78.00 >39.00 mg/dL   VLDL 14.8 0.0 - 40.0 mg/dL   LDL Cholesterol 76 0 - 99 mg/dL   Total CHOL/HDL Ratio 2    NonHDL 90.70   COMPLETE METABOLIC PANEL WITH GFR    Result Value Ref Range   Glucose, Bld 395 (H) 65 - 99 mg/dL   BUN 23 7 - 25 mg/dL   Creat 0.92 0.50 - 1.05 mg/dL   GFR, Est Non African American 71 > OR = 60 mL/min/1.89m   GFR, Est African American 82 > OR = 60 mL/min/1.757m  BUN/Creatinine Ratio NOT APPLICABLE 6 - 22 (calc)   Sodium 135 135 - 146 mmol/L   Potassium 6.1 (H) 3.5 -  5.3 mmol/L   Chloride 100 98 - 110 mmol/L   CO2 26 20 - 32 mmol/L   Calcium 9.2 8.6 - 10.4 mg/dL   Total Protein 6.4 6.1 - 8.1 g/dL   Albumin 3.8 3.6 - 5.1 g/dL   Globulin 2.6 1.9 - 3.7 g/dL (calc)   AG Ratio 1.5 1.0 - 2.5 (calc)   Total Bilirubin 0.4 0.2 - 1.2 mg/dL   Alkaline phosphatase (APISO) 70 33 - 130 U/L   AST 17 10 - 35 U/L   ALT 17 6 - 29 U/L  Microalbumin / creatinine urine ratio  Result Value Ref Range   Microalb, Ur <0.7 0.0 - 1.9 mg/dL   Creatinine,U 38.5 mg/dL   Microalb Creat Ratio 1.8 0.0 - 30.0 mg/g  Hepatitis C antibody  Result Value Ref Range   Hepatitis C Ab NON-REACTIVE NON-REACTI   SIGNAL TO CUT-OFF 0.01 <1.00      Assessment & Plan:  Did not return for rpt potassium - check today.  Pt aware to monitor blood pressure off quinapril.  Problem List Items Addressed This Visit    Bilateral calf pain - Primary    Calf soreness when active, cramping with rest. Longstanding and frustrating to patient.  Has had extensive rheumatologic, orthopedic, and vascular evaluation. ABIs normal 2017. Reviewed work up to date. Will recommend several other empiric treatment trials as outlined below. Will check further labwork today to include ESR, CPK, lytes, iron panel, ANA. Will trial flexeril PRN leg pain.  1. ?med related - will trial off ACEI, off statin x 1 month and update with effect. If no better, will restart meds and  2. Will empirically treat for RLS. If no better, 3. Will consider neurology referral for EMG.  4. Consider effexor trial.       Relevant Orders   CBC with Differential/Platelet   Basic metabolic panel   CK    Sedimentation rate   Ferritin   IBC panel   ANA   Magnesium   Essential hypertension   Hyperlipidemia   Type 1 diabetes mellitus with background diabetic retinopathy ([place])       Follow up plan: Return if symptoms worsen or fail to improve.  Ria Bush, MD

## 2017-11-20 LAB — ANA: Anti Nuclear Antibody(ANA): POSITIVE — AB

## 2017-11-20 LAB — ANTI-NUCLEAR AB-TITER (ANA TITER): ANA Titer 1: 1:160 {titer} — ABNORMAL HIGH

## 2017-11-26 ENCOUNTER — Encounter: Payer: Self-pay | Admitting: Family Medicine

## 2017-11-26 ENCOUNTER — Ambulatory Visit: Payer: Self-pay | Admitting: Family Medicine

## 2017-12-01 ENCOUNTER — Other Ambulatory Visit: Payer: Self-pay | Admitting: Obstetrics and Gynecology

## 2017-12-01 DIAGNOSIS — M8580 Other specified disorders of bone density and structure, unspecified site: Secondary | ICD-10-CM

## 2017-12-01 DIAGNOSIS — Z6822 Body mass index (BMI) 22.0-22.9, adult: Secondary | ICD-10-CM | POA: Diagnosis not present

## 2017-12-01 DIAGNOSIS — Z01419 Encounter for gynecological examination (general) (routine) without abnormal findings: Secondary | ICD-10-CM | POA: Diagnosis not present

## 2017-12-16 ENCOUNTER — Encounter: Payer: Self-pay | Admitting: Internal Medicine

## 2017-12-16 ENCOUNTER — Ambulatory Visit (INDEPENDENT_AMBULATORY_CARE_PROVIDER_SITE_OTHER): Payer: BLUE CROSS/BLUE SHIELD | Admitting: Internal Medicine

## 2017-12-16 VITALS — BP 126/78 | HR 80 | Ht 64.0 in | Wt 134.2 lb

## 2017-12-16 DIAGNOSIS — E785 Hyperlipidemia, unspecified: Secondary | ICD-10-CM

## 2017-12-16 DIAGNOSIS — E103299 Type 1 diabetes mellitus with mild nonproliferative diabetic retinopathy without macular edema, unspecified eye: Secondary | ICD-10-CM

## 2017-12-16 LAB — POCT GLYCOSYLATED HEMOGLOBIN (HGB A1C): Hemoglobin A1C: 7.5

## 2017-12-16 NOTE — Progress Notes (Signed)
Patient ID: NALLELI LARGENT, female   DOB: 12/26/1960, 57 y.o.   MRN: [id number]  HPI: CHISTINE DEMATTEO is a 57 y.o.-year-old female, returning for follow-up for DM1, dx in 53 (age 5), uncontrolled, with complications (mild retinopathy, gastroparesis). Last visit 4 months ago.  She was on an insulin pump for 5 years (approximately 2010), but she did not like it.  Sugars were not better when she was using the pump.  She is not interested to get back on the pump.  Last hemoglobin A1c was: Lab Results  Component Value Date   HGBA1C 7.8 08/14/2017   HGBA1C 7.5 04/08/2017   HGBA1C 7.5 10/29/2016   She is on: - Tresiba 12 units in am - R insulin: - insulin to carb ratio (ICR)   B'fast: 20:1  Lunch: 20:1   Dinner: 15:1 - target 140 - insulin sensitivity factor (ISF) 60: 140-200: + 1 unit 201-260: + 2 units 261-320: + 3 units 321-380: + 4 units >380: + 5 units Do not correct bedtime sugars <300, and only inject 1 unit then.   Tried Toujeo >> allergy: CP, SOB.  Meter: ReliOn  Pt checks her sugars 4-5 times a day: - am:  57, 115-221 >> 88, 109-264, 320 >> 86-285 - 2h after b'fast:   77 >> 153 >> n/c >> 82 >> n/c - before lunch:  67, 72, 102-285, 318 >> 73-258, 391 >> 101-257, 303, 502 - 2h after lunch: 166, 222 >> 226 >> n/c - before dinner:  62-256, 309 >> 72-253 >> 78-237, 264, 331 - 2h after dinner:  1787-162 >>3 >> n/c >> 183 >> n/c  - bedtime:  94-195, 332 >> 63-178, 355, 371 > 38 x1, 66-155, 282 - nighttime: 3 am: 52 >> 50 >> 41-67 >> n/c >> 147 Lowest sugar was 42 x1 at night >> 38; she has hypoglycemia awareness in the 70s.  No recent hypoglycemia admission ion.  She has a non-expired glucagon kit at home Highest sugar was 300s >> 502 (dried fruit).  No recent DKA admissions  Pt's meals are: - Breakfast: protein bar + fruit - 10 am snack  - Lunch: apple + cheese, carrots - Snack bar: 15g carbs - Glucerna - Dinner: meat + 1/2 backed potato + veggies/salad - Snacks:  popcorn, salty snacks: potato chips  -No CKD, last BUN/creatinine:  Lab Results  Component Value Date   BUN 21 11/19/2017   CREATININE 0.93 11/19/2017  Off quinapril 5. -+ HL; last set of lipids: Lab Results  Component Value Date   CHOL 169 09/17/2017   HDL 78.00 09/17/2017   LDLCALC 76 09/17/2017   TRIG 74.0 09/17/2017   CHOLHDL 2 09/17/2017  On CoQ10.  In 04/2016, we started pravastatin which still caused muscle cramps - she was using 2 creams that help: Theraworx + Muscle calm.  Now off statin 2/2 increased cramps. - last eye exam was in 09/2016: Mild DR -No numbness and tingling in her feet.  Last TSH normal:: Lab Results  Component Value Date   TSH 2.21 09/17/2017   She also has HTN.  ROS: Constitutional: no weight gain/no weight loss, no fatigue, no subjective hyperthermia, no subjective hypothermia Eyes: no blurry vision, no xerophthalmia ENT: no sore throat, no nodules palpated in throat, no dysphagia, no odynophagia, no hoarseness Cardiovascular: no CP/no SOB/no palpitations/no leg swelling Respiratory: no cough/no SOB/no wheezing Gastrointestinal: no N/no V/no D/no C/no acid reflux Musculoskeletal: no muscle aches/no joint aches Skin: no rashes, no hair loss  Neurological: no tremors/no numbness/no tingling/no dizziness  I reviewed pt's medications, allergies, PMH, social hx, family hx, and changes were documented in the history of present illness. Otherwise, unchanged from my initial visit note.  Past Medical History:  Diagnosis Date  . Allergy   . Anemia    past history long ago  . Disorder of kidney    has abd kidney (right)  . GERD (gastroesophageal reflux disease)   . History of diabetic gastroparesis   . HLD (hyperlipidemia)    borderline  . HTN (hypertension)    borderline  . Hx: UTI (urinary tract infection)   . Hypertension   . IBS (irritable bowel syndrome)   . Nephrolithiasis 2005  . Osteopenia 2012   per pt  . Systolic murmur 11/6382    mild mitral insuff  . Type 1 diabetes mellitus with diabetic retinopathy without macular edema (H[facility])    dx at 57 y/o, background retinopathy   Past Surgical History:  Procedure Laterality Date  . CESAREAN SECTION  1985  . COLONOSCOPY  07/2016   WNL (Pyrtle)  . ESOPHAGOGASTRODUODENOSCOPY  07/2016   LA Grade C reflux esophagitis, concern for stasis/dysmotility (Pyrtle)  . LAPAROSCOPIC APPENDECTOMY N/A 09/01/2015   Procedure: APPENDECTOMY LAPAROSCOPIC;  Surgeon: Judeth Horn, MD;  Location: [facility];  Service: General;  Laterality: N/A;  . ORIF FEMUR FRACTURE Left 1996   Corrected by patient in history 2016  . PARTIAL HYSTERECTOMY  2000   paps by [facility] [place]  . TONSILLECTOMY AND ADENOIDECTOMY  1975  . TRIGGER FINGER RELEASE Left 12/2010   thumb   Social History   Socioeconomic History  . Marital status: Married    Spouse name: Not on file  . Number of children: 1  . Years of education: Not on file  . Highest education level: Not on file  Social Needs  . Financial resource strain: Not on file  . Food insecurity - worry: Not on file  . Food insecurity - inability: Not on file  . Transportation needs - medical: Not on file  . Transportation needs - non-medical: Not on file  Occupational History  . 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 Smoking status: Never Smoker  . Smokeless tobacco: Never Used  Substance and Sexual Activity  . Alcohol use: No    Alcohol/week: 0.0 oz  . Drug use: No  . Sexual activity: Not on file  Other Topics Concern  . 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 aspirin 81 MG tablet Take 81 mg by mouth daily.      . BD INSULIN SYRINGE ULTRAFINE 31G X 15/64" 0.3 ML MISC USE TO INJECT INSULIN 6  TIMES PER DAY 200 each 1  . cyclobenzaprine (FLEXERIL) 5 MG tablet Take 1-2 tablets (5-10 mg total) by mouth 2 (two) times daily as needed for muscle spasms. 20 tablet 1  . esomeprazole (NEXIUM) 40 MG capsule Take 1 capsule by mouth daily.  6  . glucagon (GLUCAGON EMERGENCY) 1 MG injection Inject 1 mg into the muscle once as needed (in case of severe hypoglycemia). 1 each 12  . Insulin Pen Needle (NOVOFINE) 32G X 6 MM MISC USE TO  INJECT INSULIN 4 TIMES A DAY 200 each 0  . Multiple Vitamin (MULTIVITAMIN) tablet Take 1 tablet by mouth daily.      Marland Kitchen NOVOLIN R 100 UNIT/ML injection INJECT 6 UNITS INTO THE SKIN 3 TIMES A DAY BEFORE MEALS BASED ON SLIDING SCALE AND CARBS 10 mL 2  . pravastatin (PRAVACHOL) 40 MG tablet TAKE 1 TABLET (40 MG TOTAL) BY MOUTH DAILY. 90 tablet 3  . quinapril (ACCUPRIL) 5 MG tablet Take 1 tablet (5 mg total) daily by mouth. 90 tablet 3  . TRESIBA FLEXTOUCH 100 UNIT/ML SOPN FlexTouch Pen INJECT 12 UNITS INTO THE SKIN DAILY BEFORE BREAKFAST. (Patient taking differently: Inject 10 Units into the skin daily before breakfast. ) 15 pen 3   Current Facility-Administered Medications on File Prior to Visit  Medication Dose Route Frequency Provider Last Rate Last Dose  . 0.9 %  sodium chloride infusion  500 mL Intravenous Continuous Pyrtle, Lajuan Lines, MD       Allergies  Allergen Reactions  . Protonix [Pantoprazole] Swelling    Facial Swelling  . Toujeo Solostar [Insulin Glargine] Shortness Of Breath  . Lactose Intolerance (Gi)   . Magnesium-Containing Compounds Swelling   Family History  Problem Relation Age of Onset  . Diabetes Maternal Grandmother   . Heart failure Maternal Grandmother   . COPD Maternal Grandmother        smoker  . Thyroid disease Maternal Grandmother   . Vascular Disease Maternal Grandmother   . Colon cancer Maternal Grandfather   . Colon polyps Mother   . Breast cancer Other   . Esophageal cancer Neg Hx   . Rectal cancer Neg Hx   . Stomach cancer Neg  Hx    PE: BP 126/78 (BP Location: Left Arm, Patient Position: Sitting, Cuff Size: Normal)   Pulse 80   Ht 5' 4"  (1.626 m)   Wt 134 lb 3.2 oz (60.9 kg)   SpO2 100%   BMI 23.04 kg/m  Body mass index is 23.04 kg/m.  Wt Readings from Last 3 Encounters:  12/16/17 134 lb 3.2 oz (60.9 kg)  11/19/17 131 lb 8 oz (59.6 kg)  09/17/17 131 lb (59.4 kg)   Constitutional: normal weight, in NAD Eyes: PERRLA, EOMI, no exophthalmos ENT: moist mucous membranes, no thyromegaly, no cervical lymphadenopathy Cardiovascular: RRR, No MRG Respiratory: CTA B Gastrointestinal: abdomen soft, NT, ND, BS+ Musculoskeletal: no deformities, strength intact in all 4 Skin: moist, warm, no rashes Neurological: no tremor with outstretched hands, DTR normal in all 4  ASSESSMENT: 1. DM1, uncontrolled, with complications - Mild DR - Gastroparesis - This is the reason why she is on regular insulin, and she is taking it 10 minutes  rather than 30 minutes before eating  She had low CBGs with Novolog!  2. HL  PLAN:  1. Patient with long-standing, uncontrolled, type 1 diabetes, on basal/bolus insulin therapy, with still fluctuating CBG throughout the day.  We try to split her Tresiba dose but this did not help.  She is now taking [place] in the morning due to previous lows at night when she was taking it at bedtime.  At last visit, we increased Tresiba slightly.  However, we made the opposite change in the R insulin with dinner (decreased dose) to avoid lows at night. - sugars now high in am and low at bedtime >> will move Tresiba at night, give her more insulin with b'fast and less with dinner. Will also lower her target. - At last visit, we discussed  about the freestyle libre CGM, but she refused it.  - I suggested to:  Patient Instructions  Please continue: - Tresiba 12 units at night (may need 10 units if you develop lows at night) - R insulin: - insulin to carb ratio (ICR)   B'fast: 15:1  Lunch: 20:1    Dinner: 20:1 - target 130 - insulin sensitivity factor (ISF) 60: 140-200: + 1 unit 201-260: + 2 units 261-320: + 3 units 321-380: + 4 units >380: + 5 units Do not correct bedtime sugars <300, and only inject 1 unit then.   Please return in 4 months with your sugar log.     - today, HbA1c is 7.5% (better) - continue checking sugars at different times of the day - check 3x a day, rotating checks - advised for yearly eye exams >> she is UTD - Return to clinic in 4 mo with sugar log    2. HL - was on pravastatin 40 mg daily.  She had leg cramps, which improved with the creams described in the HPI. Her cramps worsened >> had to stop statin 1 mo ago >> cramps better. On CoQ10. CK normal. She will have mm testing. - discussed that she may need a PCSK9 inh. - Latest lipid panel from 09/2017 reviewed, improved  Philemon Kingdom, MD PhD [facility]

## 2017-12-16 NOTE — Patient Instructions (Addendum)
Please continue: - Tresiba 12 units at night (may need 10 units if you develop lows at night) - R insulin: - insulin to carb ratio (ICR)   B'fast: 15:1  Lunch: 20:1   Dinner: 20:1 - target 130 - insulin sensitivity factor (ISF) 60: 140-200: + 1 unit 201-260: + 2 units 261-320: + 3 units 321-380: + 4 units >380: + 5 units Do not correct bedtime sugars <300, and only inject 1 unit then.   Please return in 4 months with your sugar log.

## 2018-01-15 ENCOUNTER — Encounter: Payer: Self-pay | Admitting: Family Medicine

## 2018-01-15 DIAGNOSIS — M79662 Pain in left lower leg: Principal | ICD-10-CM

## 2018-01-15 DIAGNOSIS — M791 Myalgia, unspecified site: Secondary | ICD-10-CM

## 2018-01-15 DIAGNOSIS — M79661 Pain in right lower leg: Secondary | ICD-10-CM

## 2018-01-18 ENCOUNTER — Other Ambulatory Visit: Payer: Self-pay | Admitting: Internal Medicine

## 2018-01-22 ENCOUNTER — Encounter: Payer: Self-pay | Admitting: Neurology

## 2018-01-27 ENCOUNTER — Other Ambulatory Visit: Payer: Self-pay

## 2018-01-27 ENCOUNTER — Ambulatory Visit: Payer: BLUE CROSS/BLUE SHIELD | Admitting: Family Medicine

## 2018-01-27 ENCOUNTER — Encounter: Payer: Self-pay | Admitting: Family Medicine

## 2018-01-27 DIAGNOSIS — J309 Allergic rhinitis, unspecified: Secondary | ICD-10-CM | POA: Insufficient documentation

## 2018-01-27 MED ORDER — BENZONATATE 200 MG PO CAPS: 200.0000 mg | ORAL_CAPSULE | Freq: Two times a day (BID) | ORAL | 0 refills | Status: DC | PRN

## 2018-01-27 NOTE — Assessment & Plan Note (Signed)
Nasal saline, nasal steroid, cough suppressant. No sign of bacterial infection.

## 2018-01-27 NOTE — Progress Notes (Signed)
   Subjective:    Patient ID: Felicia DallyDebra L Molesky, female    DOB: [birth year], 57 y.o.   MRN: [id number]  Cough  This is a new problem. The current episode started in the past 7 days. The problem has been gradually worsening. The problem occurs constantly. The cough is productive of sputum. Associated symptoms include chest pain, chills, ear congestion, ear pain, headaches, nasal congestion and postnasal drip. Pertinent negatives include no fever, myalgias, sore throat, shortness of breath or wheezing. Associated symptoms comments:  Ear pressure, facial pressure. Risk factors: nonsmoker. Treatments tried: mucinex, allegra. The treatment provided no relief. Her past medical history is significant for environmental allergies. There is no history of asthma or COPD.    Feels like was triggered after dusting in office.  Review of Systems  Constitutional: Positive for chills. Negative for fever.  HENT: Positive for ear pain and postnasal drip. Negative for sore throat.   Respiratory: Positive for cough. Negative for shortness of breath and wheezing.   Cardiovascular: Positive for chest pain.  Musculoskeletal: Negative for myalgias.  Allergic/Immunologic: Positive for environmental allergies.  Neurological: Positive for headaches.       Objective:   Physical Exam  Constitutional: Vital signs are normal. She appears well-developed and well-nourished. She is cooperative.  Non-toxic appearance. She does not appear ill. No distress.  HENT:  Head: Normocephalic.  Right Ear: Hearing, tympanic membrane, external ear and ear canal normal. Tympanic membrane is not erythematous, not retracted and not bulging.  Left Ear: Hearing, tympanic membrane, external ear and ear canal normal. Tympanic membrane is not erythematous, not retracted and not bulging.  Nose: Mucosal edema and rhinorrhea present. Right sinus exhibits no maxillary sinus tenderness and no frontal sinus tenderness. Left sinus exhibits no maxillary  sinus tenderness and no frontal sinus tenderness.  Mouth/Throat: Uvula is midline, oropharynx is clear and moist and mucous membranes are normal.  Pale nasal turbinates  Eyes: Pupils are equal, round, and reactive to light. Conjunctivae, EOM and lids are normal. Lids are everted and swept, no foreign bodies found.  Neck: Trachea normal and normal range of motion. Neck supple. Carotid bruit is not present. No thyroid mass and no thyromegaly present.  Cardiovascular: Normal rate, regular rhythm, S1 normal, S2 normal, normal heart sounds, intact distal pulses and normal pulses. Exam reveals no gallop and no friction rub.  No murmur heard. Pulmonary/Chest: Effort normal and breath sounds normal. No tachypnea. No respiratory distress. She has no decreased breath sounds. She has no wheezes. She has no rhonchi. She has no rales.  Neurological: She is alert.  Skin: Skin is warm, dry and intact. No rash noted.  Psychiatric: Her speech is normal and behavior is normal. Judgment normal. Her mood appears not anxious. Cognition and memory are normal. She does not exhibit a depressed mood.          Assessment & Plan:

## 2018-01-27 NOTE — Patient Instructions (Addendum)
Mucinex  Plain or DM during the day.  Prescription cough suppressant twice daily as needed.  Continue nasal saline and allegra.  Add flonase 2 sprays per nostril daily.  Call if new fever or increase in unilateral face pain.

## 2018-01-29 ENCOUNTER — Telehealth: Payer: Self-pay | Admitting: Family Medicine

## 2018-01-29 NOTE — Telephone Encounter (Signed)
Copied from CRM [phone]. Topic: Quick Communication - See Telephone Encounter >> Jan 29, 2018  8:57 AM Arlyss Gandyichardson, Ramon Zanders N, NT wrote: CRM for notification. See Telephone encounter for: 01/29/18. Pt states she seen Dr. Ermalene SearingBedsole a few weeks ago and she is still not feeling better. She would like to see if something can be called [facility] to [facility] [place] for the post nasal drip.

## 2018-01-29 NOTE — Telephone Encounter (Signed)
I spoke with pt; pt has post nasal drip for several weeks with no improvement. OTC med is not helping and request stronger med. No fever, or S/T but has prod cough that is yellow or white. [facility] [place].

## 2018-01-30 MED ORDER — AMOXICILLIN 500 MG PO CAPS
1000.0000 mg | ORAL_CAPSULE | Freq: Two times a day (BID) | ORAL | 0 refills | Status: DC
Start: 2018-01-30 — End: 2018-04-16

## 2018-01-30 NOTE — Telephone Encounter (Signed)
Given not improving after > 10 days with symptom care.. May have bacterial infection. Sent in rx for amox x 10 days. Le pt know.

## 2018-03-04 ENCOUNTER — Other Ambulatory Visit: Payer: Self-pay | Admitting: Internal Medicine

## 2018-03-16 ENCOUNTER — Other Ambulatory Visit: Payer: Self-pay | Admitting: Internal Medicine

## 2018-03-24 ENCOUNTER — Telehealth: Payer: Self-pay | Admitting: Internal Medicine

## 2018-03-24 MED ORDER — ESOMEPRAZOLE MAGNESIUM 40 MG PO CPDR: 40.0000 mg | DELAYED_RELEASE_CAPSULE | Freq: Every day | ORAL | 0 refills | Status: DC

## 2018-03-24 NOTE — Telephone Encounter (Signed)
1 month rx sent until appointment with Hyacinth Meeker. Must keep appointment for refills.

## 2018-04-16 ENCOUNTER — Ambulatory Visit (INDEPENDENT_AMBULATORY_CARE_PROVIDER_SITE_OTHER): Payer: BLUE CROSS/BLUE SHIELD | Admitting: Physician Assistant

## 2018-04-16 ENCOUNTER — Encounter: Payer: Self-pay | Admitting: Physician Assistant

## 2018-04-16 VITALS — BP 128/64 | HR 80 | Ht 64.0 in | Wt 133.0 lb

## 2018-04-16 DIAGNOSIS — K582 Mixed irritable bowel syndrome: Secondary | ICD-10-CM

## 2018-04-16 DIAGNOSIS — K219 Gastro-esophageal reflux disease without esophagitis: Secondary | ICD-10-CM

## 2018-04-16 DIAGNOSIS — R1314 Dysphagia, pharyngoesophageal phase: Secondary | ICD-10-CM | POA: Diagnosis not present

## 2018-04-16 MED ORDER — ESOMEPRAZOLE MAGNESIUM 40 MG PO CPDR: 40.0000 mg | DELAYED_RELEASE_CAPSULE | Freq: Every day | ORAL | 3 refills | Status: DC

## 2018-04-16 NOTE — Progress Notes (Addendum)
Chief Complaint: Follow-up dysphagia and GERD  HPI:    Felicia [name] is a 57 year old female who follows with Dr. Rhea Belton and returns clinic today for follow-up of her dysphagia and reflux.    04/10/2017 office visit with me.  Last EGD and colonoscopy performed 07/11/2016.  EGD showed LA grade C reflux esophagitis, normal stomach normal duodenum.  Colonoscopy was completely normal patient was told to repeat in 10 years.  At that time patient described symptoms of dysphagia as well as some diarrhea.  At that time barium esophagram was ordered also restarted Pantoprazole 40 mg twice daily.(barium esophagram was never completed)  Stool studies were ordered to include fecal pancreatic elastase, GI pathogen panel and lactoferrin.  Stool studies returned negative.  She was sent a prescription for Xifaxan 550 mg 3 times a day x14 days for IVSD.    Today, patient explains that her dysphagia and reflux are well controlled on Esomeprazole 40 mg daily.  Does tell me if she skips a day or 2 of this medicine she will have "atrocious" heartburn and will experiencing some dysphagia.  Explains that the dysphagia seems more like "air bubble" trapped in her throat.  She will have to swallow some water and burp a lot and then eventually the food will go down.  Patient would like to stay on this medicine as it is working well for her as long she takes it.    Also describes variance in her bowel movements at this time between diarrhea to constipation. If she does not have a bowel movement in 3 days she will typically just take some Ex-Lax and have a movement.  It is either "feast or famine".  She has increased fiber with eating cashew nuts and this does help some.  Has never tried anything else.    Denies fever, chills, blood in her stool, melena, weight loss, anorexia, nausea or vomiting.  Past Medical History:  Diagnosis Date  . Allergy   . Anemia    past history long ago  . Disorder of kidney    has abd kidney (right)  .  GERD (gastroesophageal reflux disease)   . History of diabetic gastroparesis   . HLD (hyperlipidemia)    borderline  . HTN (hypertension)    borderline  . Hx: UTI (urinary tract infection)   . Hypertension   . IBS (irritable bowel syndrome)   . Nephrolithiasis 2005  . Osteopenia 2012   per pt  . Systolic murmur 01/2013   mild mitral insuff  . Type 1 diabetes mellitus with diabetic retinopathy without macular edema (HCC)    dx at 57 y/o, background retinopathy    Past Surgical History:  Procedure Laterality Date  . CESAREAN SECTION  1985  . COLONOSCOPY  07/2016   WNL (Pyrtle)  . ESOPHAGOGASTRODUODENOSCOPY  07/2016   LA Grade C reflux esophagitis, concern for stasis/dysmotility (Pyrtle)  . LAPAROSCOPIC APPENDECTOMY N/A 09/01/2015   Procedure: APPENDECTOMY LAPAROSCOPIC;  Surgeon: Jimmye Norman, MD;  Location: [facility] OR;  Service: General;  Laterality: N/A;  . ORIF FEMUR FRACTURE Left 1996   Corrected by patient in history 2016  . PARTIAL HYSTERECTOMY  2000   paps by [facility]  . TONSILLECTOMY AND ADENOIDECTOMY  1975  . TRIGGER FINGER RELEASE Left 12/2010   thumb    Current Outpatient Medications  Medication Sig Dispense Refill  . BD VEO INSULIN SYRINGE U/F 31G X 15/64" 0.3 ML MISC USE TO INJECT INSULIN 6 TIMES PER DAY 200 each 1  .  benzonatate (TESSALON) 200 MG capsule Take 1 capsule (200 mg total) by mouth 2 (two) times daily as needed for cough. 20 capsule 0  . esomeprazole (NEXIUM) 40 MG capsule Take 1 capsule (40 mg total) by mouth daily. 30 capsule 0  . Fexofenadine HCl (ALLEGRA PO) Take 1 tablet by mouth 2 (two) times daily.    Marland Kitchen glucagon (GLUCAGON EMERGENCY) 1 MG injection Inject 1 mg into the muscle once as needed (in case of severe hypoglycemia). 1 each 12  . Insulin Pen Needle (NOVOFINE) 32G X 6 MM MISC USE TO INJECT INSULIN 4 TIMES A DAY 200 each 0  . Multiple Vitamin (MULTIVITAMIN) tablet Take 1 tablet by mouth daily.      Marland Kitchen NOVOLIN R 100 UNIT/ML injection  INJECT 6 UNITS INTO THE SKIN 3 TIMES A DAY BEFORE MEALS BASED ON SLIDING SCALE AND CARBS 10 mL 2  . pravastatin (PRAVACHOL) 40 MG tablet TAKE 1 TABLET (40 MG TOTAL) BY MOUTH DAILY. 90 tablet 3  . quinapril (ACCUPRIL) 5 MG tablet Take 1 tablet (5 mg total) daily by mouth. 90 tablet 3  . TRESIBA FLEXTOUCH 100 UNIT/ML SOPN FlexTouch Pen INJECT 12 UNITS INTO THE SKIN DAILY BEFORE BREAKFAST. (Patient taking differently: Inject 10 Units into the skin daily before breakfast. ) 15 pen 3   Current Facility-Administered Medications  Medication Dose Route Frequency Provider Last Rate Last Dose  . 0.9 %  sodium chloride infusion  500 mL Intravenous Continuous Pyrtle, Carie Caddy, MD        Allergies as of 04/16/2018 - Review Complete 01/27/2018  Allergen Reaction Noted  . Protonix [pantoprazole] Swelling 04/16/2017  . Toujeo solostar [insulin glargine] Shortness Of Breath 04/21/2015  . Lactose intolerance (gi)  06/15/2013  . Magnesium-containing compounds Swelling 09/14/2015    Family History  Problem Relation Age of Onset  . Diabetes Maternal Grandmother   . Heart failure Maternal Grandmother   . COPD Maternal Grandmother        smoker  . Thyroid disease Maternal Grandmother   . Vascular Disease Maternal Grandmother   . Colon cancer Maternal Grandfather   . Colon polyps Mother   . Breast cancer Other   . Esophageal cancer Neg Hx   . Rectal cancer Neg Hx   . Stomach cancer Neg Hx     Social History   Socioeconomic History  . Marital status: Married    Spouse name: Not on file  . Number of children: 1  . Years of education: Not on file  . Highest education level: Not on file  Occupational History  . Occupation: HR Chemical engineer: OTHER    Comment: [employer]  Social Needs  . Financial resource strain: Not on file  . Food insecurity:    Worry: Not on file    Inability: Not on file  . Transportation needs:    Medical: Not on file    Non-medical: Not on file  Tobacco Use    . Smoking status: Never Smoker  . Smokeless tobacco: Never Used  Substance and Sexual Activity  . Alcohol use: No    Alcohol/week: 0.0 oz  . Drug use: No  . Sexual activity: Not on file  Lifestyle  . Physical activity:    Days per week: Not on file    Minutes per session: Not on file  . Stress: Not on file  Relationships  . Social connections:    Talks on phone: Not on file    Gets together:  Not on file    Attends religious service: Not on file    Active member of club or organization: Not on file    Attends meetings of clubs or organizations: Not on file    Relationship status: Not on file  . Intimate partner violence:    Fear of current or ex partner: Not on file    Emotionally abused: Not on file    Physically abused: Not on file    Forced sexual activity: Not on file  Other Topics Concern  . 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 Lungs clear to auscultation bilaterally.   No wheezes, crackles, or rhonchi.  Cardiovascular: Normal S1, S2. No MRG. Regular rate and rhythm. No peripheral edema, cyanosis or pallor.  Gastrointestinal:  Soft, nondistended, nontender. No rebound or guarding. Normal bowel sounds. No appreciable masses or hepatomegaly. Psychiatric: Demonstrates good judgement and reason without abnormal affect or behaviors.  MOST RECENT LABS AND IMAGING: CBC    Component Value Date/Time   WBC  6.1 11/19/2017 1308   RBC 4.27 11/19/2017 1308   HGB 12.8 11/19/2017 1308   HCT 38.7 11/19/2017 1308   PLT 317.0 11/19/2017 1308   MCV 90.5 11/19/2017 1308   MCH 29.2 09/01/2015 1835   MCHC 33.1 11/19/2017 1308   RDW 14.7 11/19/2017 1308   LYMPHSABS 2.2 11/19/2017 1308   MONOABS 0.4 11/19/2017 1308   EOSABS 0.3 11/19/2017 1308   BASOSABS 0.1 11/19/2017 1308    CMP     Component Value Date/Time   NA 140 11/19/2017 1308   K 4.9 11/19/2017 1308   CL 100 11/19/2017 1308   CO2 33 (H) 11/19/2017 1308   GLUCOSE 197 (H) 11/19/2017 1308   BUN 21 11/19/2017 1308   CREATININE 0.93 11/19/2017 1308   CREATININE 0.92 09/17/2017 0835   CALCIUM 9.8 11/19/2017 1308   PROT 6.4 09/17/2017 0835   ALBUMIN 4.2 03/19/2017 1818   AST 17 09/17/2017 0835   ALT 17 09/17/2017 0835   ALKPHOS 64 03/19/2017 1818   BILITOT 0.4 09/17/2017 0835   GFRNONAA 71 09/17/2017 0835   GFRAA 82 09/17/2017 0835    Assessment: 1.  Dysphagia: Controlled with Esomeprazole 40 mg daily; likely related to reflux 2.  GERD: Controlled on Esomeprazole 40 mg daily 3.  IBS: With both constipation and diarrhea, patient relieves this with Ex-Lax and fiber.  Plan: 1.  Refilled Esomeprazole 40 mg daily, 30-60 minutes before breakfast #90 with 3 refills. 2.  Reviewed antireflux diet and lifestyle modifications. 3.  Discussed with the patient that she should increase fiber in her diet to at least 25-35 g/day with use of fiber supplement and/or through her diet.  Did provide her with a handout. 4.  Also recommend the patient increase water to at least 6-8 8 ounce glasses of water per day.  5.  Discussed patient should use MiraLAX daily to avoid days of constipation.  Discussed titration of this up or down for soft solid bowel movements each day.  Discouraged her use of Ex-Lax as this is a stimulant laxative. 6.  Patient to follow in clinic in a year or sooner if necessary with myself or Dr. Rhea Belton.  Felicia Meeker, PA-C [facility] 04/16/2018, 8:47 AM  Cc: [name], MD   Addendum: Reviewed and agree with  management. Pyrtle, Carie Caddy, MD

## 2018-04-16 NOTE — Patient Instructions (Signed)
We have sent the following medications to your pharmacy for you to pick up at your convenience:  Esomeprazole 40 mg daily  We have given you a high fiber diet handout. Please strive to have 25-30 grams of fiber daily.   Your provider suggest that you drink more water. Try to have at least 6-8 8 oz glasses of water daily.   Use Miralax daily instead ExLax every 3 days.

## 2018-04-17 ENCOUNTER — Encounter: Payer: Self-pay | Admitting: Internal Medicine

## 2018-04-17 ENCOUNTER — Ambulatory Visit (INDEPENDENT_AMBULATORY_CARE_PROVIDER_SITE_OTHER): Payer: BLUE CROSS/BLUE SHIELD | Admitting: Internal Medicine

## 2018-04-17 VITALS — BP 126/70 | HR 78 | Ht 64.0 in | Wt 132.0 lb

## 2018-04-17 DIAGNOSIS — E785 Hyperlipidemia, unspecified: Secondary | ICD-10-CM | POA: Diagnosis not present

## 2018-04-17 DIAGNOSIS — E103299 Type 1 diabetes mellitus with mild nonproliferative diabetic retinopathy without macular edema, unspecified eye: Secondary | ICD-10-CM

## 2018-04-17 LAB — POCT GLYCOSYLATED HEMOGLOBIN (HGB A1C): Hemoglobin A1C: 7.7 % — AB (ref 4.0–5.6)

## 2018-04-17 MED ORDER — METFORMIN HCL ER 500 MG PO TB24: 1000.0000 mg | ORAL_TABLET | Freq: Every day | ORAL | 5 refills | Status: DC

## 2018-04-17 NOTE — Progress Notes (Signed)
Patient ID: Felicia [name], female   DOB: [birth year], 57 y.o.   MRN: [id number]  HPI: Felicia [name] is a 57 y.o.-year-old female, returning for follow-up for DM1, dx in 65 (age 67), uncontrolled, with complications (mild retinopathy, gastroparesis). Last visit 4 months ago.  She was on insulin pump for 5 years  (~2010), but she did not like it.  Sugars were not better when she was using the pump. She is not interested in getting back on the pump; she also refused a freestyle libre CGM.  Last hemoglobin A1c was: Lab Results  Component Value Date   HGBA1C 7.5 12/16/2017   HGBA1C 7.8 08/14/2017   HGBA1C 7.5 04/08/2017   She is on: - Tresiba 12 units at night - R insulin: - insulin to carb ratio (ICR)   B'fast: 15:1  Lunch: 20:1   Dinner: 20:1 - target 130 - insulin sensitivity factor (ISF) 60: 140-200: + 1 unit 201-260: + 2 units 261-320: + 3 units 321-380: + 4 units >380: + 5 units Do not correct bedtime sugars <300, and only inject 1 unit then.  Tried Toujeo >> allergy: CP, SOB.  Meter: ReliOn  Pt checks her sugars 4--5X a day: - am:  57, 115-221 >> 88, 109-264, 320 >> 86-285 >> 71, 104-289, 312 - 2h after b'fast:   77 >> 153 >> n/c >> 82 >> n/c >> 116-186, 471 - before lunch: 73-258, 391 >> 101-257, 303, 502 >> 73-221, 295, 383 - 2h after lunch: 166, 222 >> 226 >> n/c >> 87 - before dinner:  72-253 >> 78-237, 264, 331 >> 76-185 - 2h after dinner:   n/c >> 183 >> n/c  - bedtime:  63-178, 355, 371 > 38 x1, 66-155, 282 >> 63, 70-134, 288 (before bedtime snack) - nighttime: 3 am: 52 >> 50 >> 41-67 >> n/c >> 147 >> 46, 277 Lowest sugar was 42 x1 at night >> 38 >> 46 at 2 am, x1; she has hypoglycemia awareness in the 70s.  No recent hypo-or hyperglycemia admission.  She has a non-expired glucagon kit at home.   Highest sugar was 300s >> 502 (dried fruit) >> 471 (?).   Pt's meals are: - Breakfast: protein bar + fruit - 10 am snack  - Lunch: apple + cheese, carrots -  Snack bar: 15g carbs - Glucerna - Dinner: meat + 1/2 backed potato + veggies/salad - Snacks: popcorn, salty snacks: Potato chips  -No CKD, last BUN/creatinine:  Lab Results  Component Value Date   BUN 21 11/19/2017   CREATININE 0.93 11/19/2017  Off quinapril 5 -+ HL; last set of lipids: Lab Results  Component Value Date   CHOL 169 09/17/2017   HDL 78.00 09/17/2017   LDLCALC 76 09/17/2017   TRIG 74.0 09/17/2017   CHOLHDL 2 09/17/2017  On co-Q10.  In 04/2016, we started pravastatin, but she still developed muscle cramps.  These were initially controlled with 2 creams: Theraworx + Muscle calm, but then she had to stop the statin before last visit due to increased muscle cramps. Will have mm testing in 05/2018. - last eye exam was in 09/2016: Mild DR - no numbness and tingling in her feet.  Last TSH normal: Lab Results  Component Value Date   TSH 2.21 09/17/2017   She also has HTN.  ROS: Constitutional: no weight gain/no weight loss, no fatigue, no subjective hyperthermia, no subjective hypothermia Eyes: no blurry vision, no xerophthalmia ENT: no sore throat, no nodules palpated in throat,  no dysphagia, no odynophagia, no hoarseness Cardiovascular: no CP/no SOB/no palpitations/no leg swelling Respiratory: no cough/no SOB/no wheezing Gastrointestinal: no N/no V/no D/no C/no acid reflux Musculoskeletal: + muscle aches/no joint aches Skin: no rashes, no hair loss Neurological: no tremors/no numbness/no tingling/no dizziness  I reviewed pt's medications, allergies, PMH, social hx, family hx, and changes were documented in the history of present illness. Otherwise, unchanged from my initial visit note.  Past Medical History:  Diagnosis Date  . Allergy   . Anemia    past history long ago  . Disorder of kidney    has abd kidney (right)  . GERD (gastroesophageal reflux disease)   . History of diabetic gastroparesis   . HLD (hyperlipidemia)    borderline  . HTN (hypertension)     borderline  . Hx: UTI (urinary tract infection)   . Hypertension   . IBS (irritable bowel syndrome)   . Nephrolithiasis 2005  . Osteopenia 2012   per pt  . Systolic murmur 06/3357   mild mitral insuff  . Type 1 diabetes mellitus with diabetic retinopathy without macular edema (HCC)    dx at 57 y/o, background retinopathy   Past Surgical History:  Procedure Laterality Date  . CESAREAN SECTION  1985  . COLONOSCOPY  07/2016   WNL (Pyrtle)  . ESOPHAGOGASTRODUODENOSCOPY  07/2016   LA Grade C reflux esophagitis, concern for stasis/dysmotility (Pyrtle)  . LAPAROSCOPIC APPENDECTOMY N/A 09/01/2015   Procedure: APPENDECTOMY LAPAROSCOPIC;  Surgeon: Judeth Horn, MD;  Location: [facility];  Service: General;  Laterality: N/A;  . ORIF FEMUR FRACTURE Left 1996   Corrected by patient in history 2016  . PARTIAL HYSTERECTOMY  2000   paps by [facility]  . TONSILLECTOMY AND ADENOIDECTOMY  1975  . TRIGGER FINGER RELEASE Left 12/2010   thumb   Social History   Socioeconomic History  . Marital status: Married    Spouse name: Not on file  . Number of children: 1  . Years of education: Not on file  . Highest education level: Not on file  Occupational History  . Occupation: HR Marketing executive: OTHER    Comment: [employer]  . Financial resource strain: Not on file  . Food insecurity:    Worry: Not on file    Inability: Not on file  . Transportation needs:    Medical: Not on file    Non-medical: Not on file  Tobacco Use  . Smoking status: Never Smoker  . Smokeless tobacco: Never Used  Substance and Sexual Activity  . Alcohol use: No    Alcohol/week: 0.0 oz  . Drug use: No  . Sexual activity: Not on file  Lifestyle  . Physical activity:    Days per week: Not on file    Minutes per session: Not on file  . Stress: Not on file  Relationships  . Social connections:    Talks on phone: Not on file    Gets together: Not on file    Attends religious  service: Not on file    Active member of club or organization: Not on file    Attends meetings of clubs or organizations: Not on file    Relationship status: Not on file  . Intimate partner violence:    Fear of current or ex partner: Not on file    Emotionally abused: Not on file    Physically abused: Not on file    Forced sexual activity: Not on file  Other Topics Concern  . 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 BD VEO INSULIN SYRINGE U/F 31G X 15/64" 0.3 ML MISC USE TO INJECT INSULIN 6 TIMES PER DAY 200 each 1  . benzonatate (TESSALON) 200 MG capsule Take 1 capsule (200 mg total) by mouth 2 (two) times daily as needed for cough. 20 capsule 0  . esomeprazole (NEXIUM) 40 MG capsule Take 1 capsule (40 mg total) by mouth daily. 90 capsule 3  . glucagon (GLUCAGON EMERGENCY) 1 MG injection Inject 1 mg into the muscle once as needed (in case of severe hypoglycemia). 1 each 12  . Insulin Pen Needle (NOVOFINE) 32G X 6 MM MISC USE TO INJECT INSULIN 4 TIMES A DAY 200 each 0  . Multiple Vitamin (MULTIVITAMIN) tablet Take 1 tablet by mouth daily.      Marland Kitchen NOVOLIN R 100 UNIT/ML injection INJECT 6 UNITS INTO THE SKIN 3 TIMES A DAY BEFORE MEALS BASED ON SLIDING SCALE AND CARBS 10 mL 2  . pravastatin (PRAVACHOL) 40 MG tablet TAKE 1 TABLET (40 MG TOTAL) BY MOUTH DAILY. 90 tablet 3  . quinapril (ACCUPRIL) 5 MG tablet Take 1 tablet (5 mg total) daily by mouth. 90 tablet 3  . TRESIBA FLEXTOUCH 100 UNIT/ML SOPN FlexTouch Pen INJECT 12 UNITS INTO THE SKIN DAILY BEFORE BREAKFAST. (Patient taking differently: Inject 10 Units into the skin daily before breakfast. ) 15 pen 3   Current Facility-Administered Medications on File Prior to Visit  Medication Dose Route Frequency Provider Last Rate Last Dose  . 0.9 %   sodium chloride infusion  500 mL Intravenous Continuous Pyrtle, Lajuan Lines, MD       Allergies  Allergen Reactions  . Protonix [Pantoprazole] Swelling    Facial Swelling  . Toujeo Solostar [Insulin Glargine] Shortness Of Breath  . Lactose Intolerance (Gi)   . Magnesium-Containing Compounds Swelling   Family History  Problem Relation Age of Onset  . Diabetes Maternal Grandmother   . Heart failure Maternal Grandmother   . COPD Maternal Grandmother        smoker  . Thyroid disease Maternal Grandmother   . Vascular Disease Maternal Grandmother   . Colon cancer Maternal Grandfather   . Colon polyps Mother   . Breast cancer Other   . Esophageal cancer Neg Hx   . Rectal cancer Neg Hx   . Stomach cancer Neg Hx    PE: There were no vitals taken for this visit. There is no height or weight on file to calculate BMI.  Wt Readings from Last 3 Encounters:  04/16/18 133 lb (60.3 kg)  01/27/18 130 lb 4 oz (59.1 kg)  12/16/17 134 lb 3.2 oz (60.9 kg)   Constitutional: Normal weight, in NAD Eyes: PERRLA, EOMI, no exophthalmos ENT: moist mucous membranes, no thyromegaly, no cervical lymphadenopathy Cardiovascular: RRR, No MRG Respiratory: CTA B Gastrointestinal: abdomen soft, NT, ND, BS+ Musculoskeletal: no deformities, strength intact in all 4 Skin: moist, warm, no rashes Neurological: no tremor with outstretched hands, DTR normal in all 4  ASSESSMENT: 1. DM1, uncontrolled, with complications - Mild DR - Gastroparesis - This is the reason why she is on regular insulin, and she is taking it 10 minutes  rather than 30 minutes before  eating  She refused to try a freestyle libre CGM.  She had low CBGs with Novolog!  2. HL  PLAN:  1. Patient with Long-standing, uncontrolled, type 1 diabetes, on basal-bolus insulin regimen.  She continues to have fluctuating CBGs throughout the day.  In the past, we tried to split her Tresiba dose but this did not help.  She had low blood sugars overnight  when she was taking [place] at night, so at last visit she was taking it in the morning.  Sugars started to increase in the morning and they were too low at bedtime so at last visit, we moved back to receive by at night but I advised her to decrease the dose if she experiences low blood sugars overnight.  I also changed her insulin to carb ratio with breakfast and dinner to give her more insulin with breakfast and less with dinner.  We also lowered her target CBG.   - At this visit, she tells me that she did not move to see by in a.m., and as a consequence, her sugars in a.m. and before lunch are still high.  Since her sugars at bedtime are low normal and she may have low blood sugars overnight occasionally, we decided not to increase her Tyler Aas, but I suggested to add a low-dose metformin ER to see if this would improve her sugars in the morning.  She tells me that if she wakes up around 4 AM, sugars are much better than if she wakes up after 6 AM.  This is consistent with dawn phenomenon.  I am hoping that metformin will help with this. - I advised her that if metformin is not significantly helping or if she cannot tolerate it very well, she can decrease the insulin to carb ratio with a snack after dinner - I suggested to:  Patient Instructions  Please start: - Metformin ER 500 mg with dinner. You can increase to 1000 mg if tolerated.  Please continue: - Tresiba 12 units in am - R insulin: - insulin to carb ratio (ICR)   B'fast: 15:1  Lunch: 20:1   Dinner: 20:1   For the snack after dinner, try 15:1 if Metformin not helping - target 130 - insulin sensitivity factor (ISF) 60: 140-200: + 1 unit 201-260: + 2 units 261-320: + 3 units 321-380: + 4 units >380: + 5 units Do not correct bedtime sugars <300, and only inject 1 unit then.   - today, HbA1c is 7.7% (slightly higher) - continue checking sugars at different times of the day - check 3x a day, rotating checks - Return to clinic in 4  mo with sugar log   2. HL - Reviewed latest lipid panel from 09/2017: Improved - She had significant side effects from statins in the past, although she really wanted to stay on these.  She last tried pravastatin 40 mg daily, but she continued to have leg cramps.  She used the creams described in the HPI, however, her cramps worsened and before last visit she had to stop the statin for 1 months.  The cramps improved after stopping statins.  She continues on co-Q10.  CK was normal.  She will have muscle testing in July - She may need PCSK9 inhibitors, discussed about the possible referral to the lipid clinic for this  Philemon Kingdom, MD PhD [facility]

## 2018-04-17 NOTE — Addendum Note (Signed)
Addended by: Yolande JollyLAWSON, Ayn Domangue on: 04/17/2018 01:06 PM   Modules accepted: Orders

## 2018-04-17 NOTE — Patient Instructions (Signed)
Please start: - Metformin ER 500 mg with dinner. You can increase to 1000 mg if tolerated.  Please continue: - Tresiba 12 units at night - R insulin: - insulin to carb ratio (ICR)   B'fast: 15:1  Lunch: 20:1   Dinner: 20:1   For the snack after dinner, try 15:1 if Metformin not helping - target 130 - insulin sensitivity factor (ISF) 60: 140-200: + 1 unit 201-260: + 2 units 261-320: + 3 units 321-380: + 4 units >380: + 5 units Do not correct bedtime sugars <300, and only inject 1 unit then.

## 2018-04-21 ENCOUNTER — Encounter: Payer: Self-pay | Admitting: Internal Medicine

## 2018-04-23 DIAGNOSIS — M79601 Pain in right arm: Secondary | ICD-10-CM | POA: Diagnosis not present

## 2018-04-23 DIAGNOSIS — G5600 Carpal tunnel syndrome, unspecified upper limb: Secondary | ICD-10-CM | POA: Insufficient documentation

## 2018-05-25 NOTE — Progress Notes (Signed)
[facility] Clinic Note - Initial Visit   Date: 05/27/18  [name] MRN: [id number] DOB: August 17, 1961   Dear Dr. Leo Grosser:  Thank you for your kind referral of Felicia [name] for consultation of muscle cramps. Although her history is well known to you, please allow Felicia [name] to reiterate it for the purpose of our medical record. The patient was accompanied to the clinic by self.   History of Present Illness: Felicia [name] is a 57 y.o. right-handed Caucasian female with type 1 diabetes, GERD, hypertension, and hyperlipidemia presenting for evaluation of leg cramps.    Starting around 2010, she began having episodic cramps of the lower legs.  Over the past several years, the intensity and frequency of leg cramps have steadily worsened and occurring earlier into the day and sometimes wakes her up from sleeping.  Cramps are worse in the evening and after activity and with prolonged inactivity. She is still able to continue with her usual activities, but finds that it is harder to do. She also has muscle soreness throughout the day.  She does not have cramp in her thigh and arms.  She has some relief with laying down and applying rubbing essential oil which helps temporarily.  Sometimes, she has tingling that involves her back and radiates down her legs.  She has previously been evaluated by orthopaedics and rheumatology for these symptoms.  MRI lumbar spine shows mild degenerative changes, per patient's report.  Labs including CK, ESR, electrolytes, and ferritin has been normal.  She has tried magnesium, but did not tolerate this.  She discontinued statin therapy for 1 month which relieved her symptoms by 50% but did not completely resolve it, so has restarted it.   No family history of neurological disease, muscle disease or cramps.    Out-side paper records, electronic medical record, and images have been reviewed where available and summarized as:  Labs 11/19/2017:  CK  65, ESR 17, ferritin 30, ANA positive 1:160, Mg 1.9 Labs 04/17/2018:  HbA1c 7.7  Past Medical History:  Diagnosis Date  . Allergy   . Anemia    past history long ago  . Disorder of kidney    has abd kidney (right)  . GERD (gastroesophageal reflux disease)   . History of diabetic gastroparesis   . HLD (hyperlipidemia)    borderline  . HTN (hypertension)    borderline  . Hx: UTI (urinary tract infection)   . Hypertension   . IBS (irritable bowel syndrome)   . Nephrolithiasis 2005  . Osteopenia 2012   per pt  . Systolic murmur 01/3294   mild mitral insuff  . Type 1 diabetes mellitus with diabetic retinopathy without macular edema (HCC)    dx at 57 y/o, background retinopathy    Past Surgical History:  Procedure Laterality Date  . CESAREAN SECTION  1985  . COLONOSCOPY  07/2016   WNL (Pyrtle)  . ESOPHAGOGASTRODUODENOSCOPY  07/2016   LA Grade C reflux esophagitis, concern for stasis/dysmotility (Pyrtle)  . LAPAROSCOPIC APPENDECTOMY N/A 09/01/2015   Procedure: APPENDECTOMY LAPAROSCOPIC;  Surgeon: Judeth Horn, MD;  Location: [facility];  Service: General;  Laterality: N/A;  . ORIF FEMUR FRACTURE Left 1996   Corrected by patient in history 2016  . PARTIAL HYSTERECTOMY  2000   paps by [facility] [place]  . TONSILLECTOMY AND ADENOIDECTOMY  1975  . TRIGGER FINGER RELEASE Left 12/2010   thumb     Medications:  Outpatient Encounter Medications as of 05/27/2018  Medication  Sig  . BD VEO INSULIN SYRINGE U/F 31G X 15/64" 0.3 ML MISC USE TO INJECT INSULIN 6 TIMES PER DAY  . esomeprazole (NEXIUM) 40 MG capsule Take 1 capsule (40 mg total) by mouth daily.  Marland Kitchen glucagon (GLUCAGON EMERGENCY) 1 MG injection Inject 1 mg into the muscle once as needed (in case of severe hypoglycemia).  . Insulin Pen Needle (NOVOFINE) 32G X 6 MM MISC USE TO INJECT INSULIN 4 TIMES A DAY  . Multiple Vitamin (MULTIVITAMIN) tablet Take 1 tablet by mouth daily.    Marland Kitchen NOVOLIN R 100 UNIT/ML injection INJECT 6 UNITS  INTO THE SKIN 3 TIMES A DAY BEFORE MEALS BASED ON SLIDING SCALE AND CARBS  . pravastatin (PRAVACHOL) 40 MG tablet TAKE 1 TABLET (40 MG TOTAL) BY MOUTH DAILY.  Marland Kitchen quinapril (ACCUPRIL) 5 MG tablet Take 1 tablet (5 mg total) daily by mouth.  . TRESIBA FLEXTOUCH 100 UNIT/ML SOPN FlexTouch Pen INJECT 12 UNITS INTO THE SKIN DAILY BEFORE BREAKFAST. (Patient taking differently: Inject 10 Units into the skin daily before breakfast. )  . metFORMIN (GLUCOPHAGE-XR) 500 MG 24 hr tablet Take 2 tablets (1,000 mg total) by mouth daily with supper. (Patient not taking: Reported on 05/27/2018)   Facility-Administered Encounter Medications as of 05/27/2018  Medication  . 0.9 %  sodium chloride infusion     Allergies:  Allergies  Allergen Reactions  . Protonix [Pantoprazole] Swelling    Facial Swelling  . Toujeo Solostar [Insulin Glargine] Shortness Of Breath  . Lactose Intolerance (Gi)   . Magnesium-Containing Compounds Swelling    Family History: Family History  Problem Relation Age of Onset  . Diabetes Maternal Grandmother   . Heart failure Maternal Grandmother   . COPD Maternal Grandmother        smoker  . Thyroid disease Maternal Grandmother   . Vascular Disease Maternal Grandmother   . Colon cancer Maternal Grandfather   . Colon polyps Mother   . Breast cancer Other   . Esophageal cancer Neg Hx   . Rectal cancer Neg Hx   . Stomach cancer Neg Hx     Social History: Social History   Tobacco Use  . Smoking status: Never Smoker  . Smokeless tobacco: Never Used  Substance Use Topics  . Alcohol use: No    Alcohol/week: 0.0 oz  . 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  Has one daughter.  Education: college.      1 dog    Review of Systems:  CONSTITUTIONAL: No fevers, chills, night sweats, or weight loss.   EYES: No visual changes or eye pain ENT: No hearing changes.   No history of nose bleeds.   RESPIRATORY: No cough, wheezing and shortness of breath.   CARDIOVASCULAR: Negative for chest pain, and palpitations.   GI: Negative for abdominal discomfort, blood in stools or black stools.  No recent change in bowel habits.   GU:  No history of incontinence.   MUSCLOSKELETAL: No history of joint pain or swelling.  +myalgias.   SKIN: Negative for lesions, rash, and itching.   HEMATOLOGY/ONCOLOGY: Negative for prolonged bleeding, bruising easily, and swollen nodes.  No history of cancer.   ENDOCRINE: Negative for cold or heat intolerance, polydipsia or goiter.   PSYCH:  No depression or anxiety symptoms.   NEURO: As Above.   Vital Signs:  BP 120/80   Pulse 77   Ht 5' 4"  (1.626 m)   Wt 137 lb 2 oz (62.2 kg)   SpO2 99%   BMI 23.54 kg/m    General Medical Exam:   General:  Well appearing, comfortable.   Eyes/ENT: see cranial nerve examination.   Neck: No masses appreciated.  Full range of motion without tenderness.  No carotid bruits. Respiratory:  Clear to auscultation, good air entry bilaterally.   Cardiac:  Regular rate and rhythm, no murmur.   Extremities:  No deformities, edema, or skin discoloration.  Tight Achilles tendon bilaterally. Skin:  No rashes or lesions.  Neurological Exam: MENTAL STATUS including orientation to time, place, person, recent and remote memory, attention span and concentration, language, and fund of knowledge is normal.  Speech is not dysarthric.  CRANIAL NERVES: II:  No visual field defects.  Unremarkable fundi.   III-IV-VI: Pupils equal round and reactive to light.  Normal conjugate, extra-ocular eye movements in all directions of gaze.  No nystagmus.  No ptosis.   V:  Normal facial sensation. Absent Jaw jerk.   VII:  Normal facial symmetry and movements.  No pathologic facial reflexes.  VIII:  Normal hearing and vestibular function.   IX-X:  Normal palatal movement.   XI:  Normal shoulder shrug and head rotation.     XII:  Normal tongue strength and range of motion, no deviation or fasciculation.  MOTOR:  No atrophy, fasciculations or abnormal movements.  No pronator drift.  Tone is increased in the legs.    Right Upper Extremity:    Left Upper Extremity:    Deltoid  5/5   Deltoid  5/5   Biceps  5/5   Biceps  5/5   Triceps  5/5   Triceps  5/5   Wrist extensors  5/5   Wrist extensors  5/5   Wrist flexors  5/5   Wrist flexors  5/5   Finger extensors  5/5   Finger extensors  5/5   Finger flexors  5/5   Finger flexors  5/5   Dorsal interossei  5/5   Dorsal interossei  5/5   Abductor pollicis  5/5   Abductor pollicis  5/5   Tone (Ashworth scale)  0  Tone (Ashworth scale)  0   Right Lower Extremity:    Left Lower Extremity:    Hip flexors  5/5   Hip flexors  5/5   Hip extensors  5/5   Hip extensors  5/5   Knee flexors  5/5   Knee flexors  5/5   Knee extensors  5/5   Knee extensors  5/5   Dorsiflexors  5/5   Dorsiflexors  5/5   Plantarflexors  5/5   Plantarflexors  5/5   Toe extensors  5/5   Toe extensors  5/5   Toe flexors  5/5   Toe flexors  5/5   Tone (Ashworth scale)  1  Tone (Ashworth scale)  1   MSRs:  Right                                                                 Left brachioradialis 2+  brachioradialis 2+  biceps 2+  biceps 2+  triceps 2+  triceps 2+  patellar 3+  patellar 3+  ankle jerk 2+  ankle jerk 2+  Hoffman no  Hoffman no  plantar response up  plantar response up   SENSORY:  Normal and symmetric perception of light touch, pinprick, vibration, and proprioception.     COORDINATION/GAIT: Normal finger-to- nose-finger.  Intact rapid alternating movements bilaterally.  Able to rise from a chair without using arms.  Gait narrow based and stable. Tandem and stressed gait intact.    IMPRESSION: Mrs. Burnett is a pleasant 57 year-old female referred for evaluation of bilateral lower leg muscle cramps. Her exam does not show muscle weakness or atrophy, but there is increased tone  in the legs as well as brisk reflexes and extensor plantar responses.  These findings are suggestive of myelopathy, so will proceed with imaging the lumbar and thoracic spine.  Myelopathy labs will also be checked.  If this is normal, she will need further imaging of the cervical spine and brain.    PLAN/RECOMMENDATIONS:  Check vitamin E, vitamin B12, copper, zinc MRI lumbar wo spine MRI thoracic spine  wwo contrast Request imaging report from [facility] Recommend performing leg stretches at least 2-3 times daily Muscle relaxants declined  Further recommendations pending results   Thank you for allowing me to participate in patient's care.  If I can answer any additional questions, I would be pleased to do so.    Sincerely,    Althia Egolf K. Posey Pronto, DO

## 2018-05-27 ENCOUNTER — Ambulatory Visit (INDEPENDENT_AMBULATORY_CARE_PROVIDER_SITE_OTHER): Payer: BLUE CROSS/BLUE SHIELD | Admitting: Neurology

## 2018-05-27 ENCOUNTER — Other Ambulatory Visit (INDEPENDENT_AMBULATORY_CARE_PROVIDER_SITE_OTHER): Payer: BLUE CROSS/BLUE SHIELD

## 2018-05-27 ENCOUNTER — Encounter

## 2018-05-27 ENCOUNTER — Encounter: Payer: Self-pay | Admitting: Neurology

## 2018-05-27 VITALS — BP 120/80 | HR 77 | Ht 64.0 in | Wt 137.1 lb

## 2018-05-27 DIAGNOSIS — G959 Disease of spinal cord, unspecified: Secondary | ICD-10-CM

## 2018-05-27 DIAGNOSIS — R252 Cramp and spasm: Secondary | ICD-10-CM

## 2018-05-27 LAB — VITAMIN B12: Vitamin B-12: 875 pg/mL (ref 211–911)

## 2018-05-27 NOTE — Patient Instructions (Signed)
Check labs  MRI lumbar spine and thoracic spine  Start leg stretches at least 2-3 times daily

## 2018-05-29 ENCOUNTER — Other Ambulatory Visit: Payer: Self-pay | Admitting: Obstetrics and Gynecology

## 2018-05-29 DIAGNOSIS — Z1231 Encounter for screening mammogram for malignant neoplasm of breast: Secondary | ICD-10-CM

## 2018-05-29 LAB — ZINC: Zinc: 60 ug/dL (ref 60–130)

## 2018-05-31 LAB — VITAMIN E
Gamma-Tocopherol (Vit E): 1.2 mg/L (ref <=4.3)
Vitamin E (Alpha Tocopherol): 15.5 mg/L (ref 5.7–19.9)

## 2018-05-31 LAB — COPPER, SERUM: Copper: 140 ug/dL (ref 70–175)

## 2018-06-15 ENCOUNTER — Telehealth: Payer: Self-pay | Admitting: Neurology

## 2018-06-15 NOTE — Telephone Encounter (Signed)
Arina called [facility] and left a voicemail stating that she had some questions about this patient and the testing ordered. She needed to speak with Morrie SheldonAshley or Dr.Patel. Please call her back at [phone]. Thanks.

## 2018-06-16 NOTE — Telephone Encounter (Signed)
Left message for Felicia [name] to call me back.

## 2018-06-20 ENCOUNTER — Other Ambulatory Visit: Payer: BLUE CROSS/BLUE SHIELD

## 2018-07-09 ENCOUNTER — Ambulatory Visit
Admission: RE | Admit: 2018-07-09 | Discharge: 2018-07-09 | Disposition: A | Discharge status: Home / Self Care | Payer: BLUE CROSS/BLUE SHIELD | Source: Ambulatory Visit | Attending: Obstetrics and Gynecology | Admitting: Obstetrics and Gynecology

## 2018-07-09 DIAGNOSIS — Z1231 Encounter for screening mammogram for malignant neoplasm of breast: Secondary | ICD-10-CM | POA: Insufficient documentation

## 2018-07-10 ENCOUNTER — Other Ambulatory Visit: Payer: Self-pay | Admitting: Internal Medicine

## 2018-07-13 ENCOUNTER — Other Ambulatory Visit: Payer: Self-pay | Admitting: Internal Medicine

## 2018-07-13 NOTE — Telephone Encounter (Signed)
Yes, ok - 90 day supply with 3 refills

## 2018-07-13 NOTE — Telephone Encounter (Signed)
Is this okay to refill? 

## 2018-08-02 ENCOUNTER — Other Ambulatory Visit: Payer: Self-pay | Admitting: Internal Medicine

## 2018-08-03 ENCOUNTER — Other Ambulatory Visit: Payer: Self-pay

## 2018-08-03 MED ORDER — INSULIN REGULAR HUMAN 100 UNIT/ML IJ SOLN: 6.0000 [IU] | Freq: Three times a day (TID) | INTRAMUSCULAR | 0 refills | Status: DC

## 2018-08-06 ENCOUNTER — Ambulatory Visit (INDEPENDENT_AMBULATORY_CARE_PROVIDER_SITE_OTHER)
Admission: RE | Admit: 2018-08-06 | Discharge: 2018-08-06 | Disposition: A | Discharge status: Home / Self Care | Payer: BLUE CROSS/BLUE SHIELD | Source: Ambulatory Visit | Attending: Family Medicine | Admitting: Family Medicine

## 2018-08-06 ENCOUNTER — Ambulatory Visit: Payer: BLUE CROSS/BLUE SHIELD | Admitting: Family Medicine

## 2018-08-06 ENCOUNTER — Encounter: Payer: Self-pay | Admitting: Family Medicine

## 2018-08-06 VITALS — BP 134/76 | HR 78 | Temp 97.8°F | Ht 64.0 in | Wt 131.5 lb

## 2018-08-06 DIAGNOSIS — M79672 Pain in left foot: Secondary | ICD-10-CM

## 2018-08-06 DIAGNOSIS — M79605 Pain in left leg: Secondary | ICD-10-CM

## 2018-08-06 DIAGNOSIS — M79604 Pain in right leg: Secondary | ICD-10-CM

## 2018-08-06 MED ORDER — FERROUS SULFATE ER 142 (45 FE) MG PO TBCR: 1.0000 | EXTENDED_RELEASE_TABLET | ORAL | Status: DC

## 2018-08-06 NOTE — Progress Notes (Signed)
BP 134/76 (BP Location: Left Arm, Patient Position: Sitting, Cuff Size: Normal)   Pulse 78   Temp 97.8 F (36.6 C) (Oral)   Ht 5\' 4"  (1.626 m)   Wt 131 lb 8 oz (59.6 kg)   SpO2 98%   BMI 22.57 kg/m    CC: L leg pain/swelling Subjective:    Patient ID: Felicia [name], female    DOB: 03/26/1961, 57 y.o.   MRN: [id number]  HPI: Felicia [name] is a 57 y.o. female presenting on 08/06/2018 for Ankle Pain (C/o left ankle pain radiating up anterior leg and down into the foot. Pain is worsening. Also, c/o swelling. Started about 2 wks ago. )   Chronic longstanding leg cramping s/p extensive evaluation. Stopping statins improved symptoms by 50%. Most recently saw neurology 05/2018, note reviewed - planned lumbar and thoracic MRI to eval for myelopathy (these are still pending due to finances). Vit b12, copper, zinc, vit E were normal.   Several week h/o L ankle discomfort. Compression wrap has helped. Increased swelling as day progresses. Progressive lateral ankle swelling. No know inciting trauma/injury. No knee pain. No redness or warmth.   Has significantly increased walking recently.   Upcoming trip to [place].   Relevant past medical, surgical, family and social history reviewed and updated as indicated. Interim medical history since our last visit reviewed. Allergies and medications reviewed and updated. Outpatient Medications Prior to Visit  Medication Sig Dispense Refill  . BD VEO INSULIN SYRINGE U/F 31G X 15/64" 0.3 ML MISC USE TO INJECT INSULIN 6 TIMES PER DAY 200 each 1  . esomeprazole (NEXIUM) 40 MG capsule Take 1 capsule (40 mg total) by mouth daily. 90 capsule 3  . glucagon (GLUCAGON EMERGENCY) 1 MG injection Inject 1 mg into the muscle once as needed (in case of severe hypoglycemia). 1 each 12  . Insulin Pen Needle (NOVOFINE) 32G X 6 MM MISC USE TO INJECT INSULIN 4 TIMES A DAY 200 each 0  . insulin regular (NOVOLIN R) 100 units/mL injection Inject 0.06 mLs (6 Units total)  into the skin 3 (three) times daily before meals. 30 mL 0  . Multiple Vitamin (MULTIVITAMIN) tablet Take 1 tablet by mouth daily.      . pravastatin (PRAVACHOL) 40 MG tablet TAKE 1 TABLET (40 MG TOTAL) BY MOUTH DAILY. 90 tablet 3  . quinapril (ACCUPRIL) 5 MG tablet Take 1 tablet (5 mg total) daily by mouth. 90 tablet 3  . TRESIBA FLEXTOUCH 100 UNIT/ML SOPN FlexTouch Pen INJECT 12 UNITS INTO THE SKIN DAILY BEFORE BREAKFAST. 15 pen 11  . metFORMIN (GLUCOPHAGE-XR) 500 MG 24 hr tablet Take 2 tablets (1,000 mg total) by mouth daily with supper. (Patient not taking: Reported on 05/27/2018) 60 tablet 5   Facility-Administered Medications Prior to Visit  Medication Dose Route Frequency Provider Last Rate Last Dose  . 0.9 %  sodium chloride infusion  500 mL Intravenous Continuous Pyrtle, Carie Caddy, MD         Per HPI unless specifically indicated in ROS section below Review of Systems     Objective:    BP 134/76 (BP Location: Left Arm, Patient Position: Sitting, Cuff Size: Normal)   Pulse 78   Temp 97.8 F (36.6 C) (Oral)   Ht 5\' 4"  (1.626 m)   Wt 131 lb 8 oz (59.6 kg)   SpO2 98%   BMI 22.57 kg/m   Wt Readings from Last 3 Encounters:  08/06/18 131 lb 8 oz (59.6 kg)  05/27/18 137 lb 2 oz (62.2 kg)  04/17/18 132 lb (59.9 kg)    Physical Exam  Constitutional: She appears well-developed and well-nourished. No distress.  Musculoskeletal: Normal range of motion. She exhibits edema.  2+ DP bilaterally R foot WNL Mild L dorsal foot swelling Point tender 2nd/3rd mid metatarsals dorsally and ventrally No tenderness at achilles or heel No palpable cords  Neurological: She is alert.  Skin: Skin is warm and dry. No rash noted. No erythema.  Nursing note and vitals reviewed.  DG Foot Complete Left CLINICAL DATA:  Dorsal foot pain  EXAM: LEFT FOOT - COMPLETE 3+ VIEW  COMPARISON:  None.  FINDINGS: There is no evidence of fracture or dislocation. There is no evidence of arthropathy or other  focal bone abnormality. Soft tissues are unremarkable.  IMPRESSION: No acute abnormality noted.  Electronically Signed   By: Alcide Clever M.D.   On: 08/06/2018 14:28      Assessment & Plan:   Problem List Items Addressed This Visit    Leg pain, bilateral    Appreciate neuro care. Planned MRI - pt will contact neuro when ready to proceed with these.       Left foot pain - Primary    Point tenderness along left 2/3 metatarsal shafts in setting of significant recent increased walking - suspicious for metatarsal stress fracture. Will check xray today, will treat as stress fracture and place patient in post op shoe x2 wks, f/u in clinic. Discussed tylenol for pain, avoid NSAIDs - pt declines stronger medication for pain.       Relevant Orders   DG Foot Complete Left (Completed)       Meds ordered this encounter  Medications  . Ferrous Sulfate (SLOW FE) 142 (45 Fe) MG TBCR    Sig: Take 1 tablet by mouth every 3 (three) days.    Dispense:  30 tablet   Orders Placed This Encounter  Procedures  . DG Foot Complete Left    Standing Status:   Future    Number of Occurrences:   1    Standing Expiration Date:   10/07/2019    Order Specific Question:   Reason for Exam (SYMPTOM  OR DIAGNOSIS REQUIRED)    Answer:   L dorsal foot pain eval 2nd/3rd MT stress fracture    Order Specific Question:   Is patient pregnant?    Answer:   No    Order Specific Question:   Preferred imaging location?    Answer:   [facility]    Order Specific Question:   Radiology Contrast Protocol - do NOT remove file path    Answer:   [email or web address]    Follow up plan: Return in about 2 weeks (around 08/20/2018), or if symptoms worsen or fail to improve, for follow up visit.  [name], MD

## 2018-08-06 NOTE — Patient Instructions (Addendum)
Xray of left foot today to evaluate for metatarsal stress fracture Use post op shoe for next 2 weeks, then return for follow up in 2 weeks. Let us know sooner if worsening pain.  Use tylenol 500mg  as needed for pain.   Stress Fracture Stress fracture is a small break or crack in a bone. A stress fracture can be fully broken (complete) or partially broken (incomplete). The most common sites for stress fractures are the bones in the front of your feet (metatarsals), your heels (calcaneus), and the long bone of your lower leg (tibia). What are the causes? A stress fracture is caused by overuse or repetitive exercise, such as running. It happens when a bone cannot absorb any more shock because the muscles around it are weak. Stress fractures happen most commonly when:  You rapidly increase or start a new physical activity.  You use shoes that are worn out or do not fit you properly.  You exercise on a new surface.  What increases the risk? You may be at higher risk for this type of fracture if:  You have a condition that causes weak bones (osteoporosis).  You are female. Stress fractures are more likely to occur in women.  What are the signs or symptoms? The most common symptom of a stress fracture is feeling pain when you are using the affected part of your body. The pain usually goes away when you are resting. Other symptoms may include:  Swelling of the affected area.  Pain in the area when it is touched.  Decreased pain while resting.  Stress fracture pain usually develops over time. How is this diagnosed? Diagnosis may include:  Medical history and physical exam.  X-rays.  Bone scan.  MRI.  How is this treated? Treatment depends on the severity of your stress fracture. Treatment usually involves resting, icing, compression, and elevation (RICE) of the affected part of your body. Treatment may also include:  Medicines to reduce inflammation.  A cast or a walking  shoe.  Crutches.  Surgery.  Follow these instructions at home: If you have a cast:  Do not stick anything inside the cast to scratch your skin. Doing that increases your risk of infection.  Check the skin around the cast every day. Report any concerns to your health care provider. You may put lotion on dry skin around the edges of the cast. Do not apply lotion to the skin underneath the cast.  Keep the cast clean and dry.  Cover the cast with a watertight plastic bag to protect it from water while you take a bath or a shower. Do not let the cast get wet.  Do not put pressure on any part of the cast until it is fully hardened. This may take several hours. If You Have a Walking Shoe:   Wear it as directed by your health care provider. Managing pain, stiffness, and swelling  If directed, apply ice to the injured area: ? Put ice in a plastic bag. ? Place a towel between your skin and the bag. ? Leave the ice on for 20 minutes, 2-3 times per day.  Move your fingers or toes often to avoid stiffness and to lessen swelling.  Raise the injured area above the level of your heart while you are sitting or lying down. Activity  Rest as directed by your health care provider. Ask your health care provider if you may do alternative exercises, such as swimming or biking, while you are healing.  Return  to your normal activities as directed by your health care provider. Ask your health care provider what activities are safe for you.  Perform range-of-motion exercises only as directed by your health care provider. Safety  Do not use the injured limb to support yourbody weight until your health care provider says that you can. Use crutches if your health care provider tells you to do so. General instructions  Do not use any tobacco products, including cigarettes, chewing tobacco, or electronic cigarettes. Tobacco can delay bone healing. If you need help quitting, ask your health care  provider.  Take medicines only as directed by your health care provider.  Keep all follow-up visits as directed by your health care provider. This is important. How is this prevented?  Only wear shoes that: ? Fit well. ? Are not worn out.  Eat a healthy diet that contains vitamin D and calcium. This helps keeps your bones strong.  Be careful when you start a new physical activity. Give your body time to adjust.  Avoid doing only one kind of activity. Do different exercises, such as swimming and running, so that no single part of your body gets overused.  Do strength-training exercises. Contact a health care provider if:  Your pain gets worse.  You have new symptoms.  You have increased swelling. Get help right away if:  You lose feeling in the affected area. This information is not intended to replace advice given to you by your health care provider. Make sure you discuss any questions you have with your health care provider. Document Released: 01/11/2003 Document Revised: 06/19/2016 Document Reviewed: 05/26/2014 Elsevier Interactive Patient Education  [employer].

## 2018-08-07 DIAGNOSIS — M79672 Pain in left foot: Secondary | ICD-10-CM | POA: Insufficient documentation

## 2018-08-07 NOTE — Assessment & Plan Note (Signed)
Point tenderness along left 2/3 metatarsal shafts in setting of significant recent increased walking - suspicious for metatarsal stress fracture. Will check xray today, will treat as stress fracture and place patient in post op shoe x2 wks, f/u in clinic. Discussed tylenol for pain, avoid NSAIDs - pt declines stronger medication for pain.

## 2018-08-07 NOTE — Assessment & Plan Note (Signed)
Appreciate neuro care. Planned MRI - pt will contact neuro when ready to proceed with these.

## 2018-09-04 ENCOUNTER — Ambulatory Visit (INDEPENDENT_AMBULATORY_CARE_PROVIDER_SITE_OTHER): Payer: BLUE CROSS/BLUE SHIELD | Admitting: Internal Medicine

## 2018-09-04 ENCOUNTER — Encounter: Payer: Self-pay | Admitting: Internal Medicine

## 2018-09-04 VITALS — BP 140/70 | HR 82 | Ht 64.0 in | Wt 135.0 lb

## 2018-09-04 DIAGNOSIS — E103299 Type 1 diabetes mellitus with mild nonproliferative diabetic retinopathy without macular edema, unspecified eye: Secondary | ICD-10-CM | POA: Diagnosis not present

## 2018-09-04 DIAGNOSIS — E785 Hyperlipidemia, unspecified: Secondary | ICD-10-CM

## 2018-09-04 LAB — POCT GLYCOSYLATED HEMOGLOBIN (HGB A1C): Hemoglobin A1C: 8 % — AB (ref 4.0–5.6)

## 2018-09-04 MED ORDER — METFORMIN HCL ER 500 MG PO TB24: 1000.0000 mg | ORAL_TABLET | Freq: Every day | ORAL | 11 refills | Status: DC

## 2018-09-04 MED ORDER — GLUCAGON (RDNA) 1 MG IJ KIT: 1.0000 mg | PACK | Freq: Once | INTRAMUSCULAR | 12 refills | Status: DC | PRN

## 2018-09-04 NOTE — Progress Notes (Signed)
Patient ID: Felicia [name], female   DOB: [birth year], 57 y.o.   MRN: [id number]  HPI: Felicia [name] is a 57 y.o.-year-old female, returning for follow-up for DM1, dx in 80 (age 57), uncontrolled, with complications (mild retinopathy, gastroparesis). Last visit 5 months ago.  At last visit, I suggested metformin ER low-dose, but she did not start this yet.  She was previously on an insulin pump for 5 years (approximately 2010) but she did not like it.  Sugars were not better when she was on the pump.  She is not interested in getting back on the pump and also refused a freestyle libre CGM.  Last hemoglobin A1c was: Lab Results  Component Value Date   HGBA1C 7.7 (A) 04/17/2018   HGBA1C 7.5 12/16/2017   HGBA1C 7.8 08/14/2017   She is on: - Tresiba 12 units in am - R insulin: - insulin to carb ratio (ICR)   B'fast: 15:1  Lunch: 20:1   Dinner: 20:1  - target 140 - insulin sensitivity factor (ISF) 60: 140-200: + 1 unit 201-260: + 2 units 261-320: + 3 units 321-380: + 4 units >380: + 5 units Do not correct bedtime sugars <300, and only inject 1 unit then.  Tried Toujeo >> allergy: CP, SOB.  Meter: ReliOn  Pt checks her sugars 4-5 times a day per review of her log: - am:  86-285 >> 71, 104-289, 312 >> 52, 59, 83-224, 342 - 2h after b'fast:  82 >> n/c >> 116-186, 471 >> 247 - before lunch: 73-221, 295, 383 >> 93-315, 422 - 2h after lunch:  226 >> n/c >> 87 >> 134 - before dinner: 78-237, 264, 331 >> 76-185 >> 71-235, 363, 396 - 2h after dinner:   n/c >> 183 >> n/c  - bedtime:  63, 70-134, 288 >> 45, 67-138, 192, 340 - nighttime:n/c >> 147 >> 46, 277 >> 56 Lowest sugar was 42 x1 at night >> 38 >> 46 at 2 am, x1 >> 45; + hypoglycemia awareness in the 70s.  No recent hypo-or hyperglycemia admission.  She does NOT have a non-expired glucagon pen at home. Highest sugar was 502 (dried fruit) >> 471 >> 422.   Pt's meals are: - Breakfast: protein bar + fruit - 10 am snack  -  Lunch: apple + cheese, carrots - Snack bar: 15g carbs - Glucerna - Dinner: meat + 1/2 backed potato + veggies/salad - Snacks: popcorn, salty snacks: Potato chips  -No CKD, last BUN/creatinine:  Lab Results  Component Value Date   BUN 21 11/19/2017   CREATININE 0.93 11/19/2017  On quinapril 5. -+ HL; last set of lipids: Lab Results  Component Value Date   CHOL 169 09/17/2017   HDL 78.00 09/17/2017   LDLCALC 76 09/17/2017   TRIG 74.0 09/17/2017   CHOLHDL 2 09/17/2017  She continues on co-Q10.  In 04/2016, we started pravastatin, but she still developed muscle cramps.  These were initially controlled with 2 creams: Theraworx + Muscle calm, but then she had to stop the statin because of increased muscle cramps.  She will have an MRI for this. - last eye exam was in 2019: Mild DR  - improved, reportedly - No numbness and tingling in her feet.  Last TSH normal: Lab Results  Component Value Date   TSH 2.21 09/17/2017   She also has HTN.  ROS: Constitutional: no weight gain/no weight loss, no fatigue, no subjective hyperthermia, no subjective hypothermia Eyes: no blurry vision, no xerophthalmia ENT:  no sore throat, no nodules palpated in neck, no dysphagia, no odynophagia, no hoarseness Cardiovascular: no CP/no SOB/no palpitations/no leg swelling Respiratory: no cough/no SOB/no wheezing Gastrointestinal: no N/no V/no D/no C/no acid reflux Musculoskeletal: + muscle aches/no joint aches Skin: no rashes, no hair loss Neurological: no tremors/no numbness/no tingling/no dizziness  I reviewed pt's medications, allergies, PMH, social hx, family hx, and changes were documented in the history of present illness. Otherwise, unchanged from my initial visit note.  Past Medical History:  Diagnosis Date  . Allergy   . Anemia    past history long ago  . Disorder of kidney    has abd kidney (right)  . GERD (gastroesophageal reflux disease)   . History of diabetic gastroparesis   . HLD  (hyperlipidemia)    borderline  . HTN (hypertension)    borderline  . Hx: UTI (urinary tract infection)   . Hypertension   . IBS (irritable bowel syndrome)   . Nephrolithiasis 2005  . Osteopenia 2012   per pt  . Systolic murmur 01/2013   mild mitral insuff  . Type 1 diabetes mellitus with diabetic retinopathy without macular edema (HCC)    dx at 57 y/o, background retinopathy   Past Surgical History:  Procedure Laterality Date  . CESAREAN SECTION  1985  . COLONOSCOPY  07/2016   WNL (Pyrtle)  . ESOPHAGOGASTRODUODENOSCOPY  07/2016   LA Grade C reflux esophagitis, concern for stasis/dysmotility (Pyrtle)  . LAPAROSCOPIC APPENDECTOMY N/A 09/01/2015   Procedure: APPENDECTOMY LAPAROSCOPIC;  Surgeon: Jimmye Norman, MD;  Location: [facility] OR;  Service: General;  Laterality: N/A;  . ORIF FEMUR FRACTURE Left 1996   Corrected by patient in history 2016  . PARTIAL HYSTERECTOMY  2000   paps by [facility]  . TONSILLECTOMY AND ADENOIDECTOMY  1975  . TRIGGER FINGER RELEASE Left 12/2010   thumb   Social History   Socioeconomic History  . Marital status: Married    Spouse name: Not on file  . Number of children: 1  . Years of education: 45  . Highest education level: Bachelor's degree (e.g., BA, AB, BS)  Occupational History  . Occupation: HR Chemical engineer: OTHER    Comment: [employer]  Social Needs  . Financial resource strain: Not on file  . Food insecurity:    Worry: Not on file    Inability: Not on file  . Transportation needs:    Medical: Not on file    Non-medical: Not on file  Tobacco Use  . Smoking status: Never Smoker  . Smokeless tobacco: Never Used  Substance and Sexual Activity  . Alcohol use: No    Alcohol/week: 0.0 standard drinks  . Drug use: No  . Sexual activity: Not on file  Lifestyle  . Physical activity:    Days per week: Not on file    Minutes per session: Not on file  . Stress: Not on file  Relationships  . Social connections:     Talks on phone: Not on file    Gets together: Not on file    Attends religious service: Not on file    Active member of club or organization: Not on file    Attends meetings of clubs or organizations: Not on file    Relationship status: Not on file  . Intimate partner violence:    Fear of current or ex partner: Not on file    Emotionally abused: Not on file    Physically abused: Not on file  Forced sexual activity: Not on file  Other Topics Concern  . 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  Has one daughter.  Education: college.      1 dog   Current Outpatient Medications on File Prior to Visit  Medication Sig Dispense Refill  . BD VEO INSULIN SYRINGE U/F 31G X 15/64" 0.3 ML MISC USE TO INJECT INSULIN 6 TIMES PER DAY 200 each 1  . esomeprazole (NEXIUM) 40 MG capsule Take 1 capsule (40 mg total) by mouth daily. 90 capsule 3  . Ferrous Sulfate (SLOW FE) 142 (45 Fe) MG TBCR Take 1 tablet by mouth every 3 (three) days. 30 tablet   . glucagon (GLUCAGON EMERGENCY) 1 MG injection Inject 1 mg into the muscle once as needed (in case of severe hypoglycemia). 1 each 12  . Insulin Pen Needle (NOVOFINE) 32G X 6 MM MISC USE TO INJECT INSULIN 4 TIMES A DAY 200 each 0  . insulin regular (NOVOLIN R) 100 units/mL injection Inject 0.06 mLs (6 Units total) into the skin 3 (three) times daily before meals. 30 mL 0  . Multiple Vitamin (MULTIVITAMIN) tablet Take 1 tablet by mouth daily.      . pravastatin (PRAVACHOL) 40 MG tablet TAKE 1 TABLET (40 MG TOTAL) BY MOUTH DAILY. 90 tablet 3  . quinapril (ACCUPRIL) 5 MG tablet Take 1 tablet (5 mg total) daily by mouth. 90 tablet 3  . TRESIBA FLEXTOUCH 100 UNIT/ML SOPN FlexTouch Pen INJECT 12 UNITS INTO THE SKIN DAILY BEFORE BREAKFAST. 15 pen 11   Current Facility-Administered Medications on File Prior to Visit  Medication Dose Route Frequency Provider Last Rate Last  Dose  . 0.9 %  sodium chloride infusion  500 mL Intravenous Continuous Pyrtle, Carie Caddy, MD       Allergies  Allergen Reactions  . Protonix [Pantoprazole] Swelling    Facial Swelling  . Toujeo Solostar [Insulin Glargine] Shortness Of Breath  . Lactose Intolerance (Gi)   . Magnesium-Containing Compounds Swelling   Family History  Problem Relation Age of Onset  . Diabetes Maternal Grandmother   . Heart failure Maternal Grandmother   . COPD Maternal Grandmother        smoker  . Thyroid disease Maternal Grandmother   . Vascular Disease Maternal Grandmother   . Colon cancer Maternal Grandfather   . Colon polyps Mother   . Breast cancer Other   . Esophageal cancer Neg Hx   . Rectal cancer Neg Hx   . Stomach cancer Neg Hx    PE: BP 140/70   Pulse 82   Ht 5\' 4"  (1.626 m)   Wt 135 lb (61.2 kg)   SpO2 98%   BMI 23.17 kg/m  Body mass index is 23.17 kg/m.  Wt Readings from Last 3 Encounters:  09/04/18 135 lb (61.2 kg)  08/06/18 131 lb 8 oz (59.6 kg)  05/27/18 137 lb 2 oz (62.2 kg)   Constitutional: Normal weight, in NAD Eyes: PERRLA, EOMI, no exophthalmos ENT: moist mucous membranes, no thyromegaly, no cervical lymphadenopathy Cardiovascular: RRR, No MRG Respiratory: CTA B Gastrointestinal: abdomen soft, NT, ND, BS+ Musculoskeletal: no deformities, strength intact in all 4 Skin: moist, warm, no rashes Neurological: no tremor with outstretched hands, DTR normal in all 4  ASSESSMENT: 1. DM1, uncontrolled, with complications - Mild DR - Gastroparesis - This is the reason why she is  on regular insulin, and she is taking it 10 minutes  rather than 30 minutes before eating  She refused to try a freestyle libre CGM.  She had low CBGs with Novolog!  2. HL  PLAN:  1. Patient with long-standing, uncontrolled, type 1 diabetes, on basal-bolus insulin regimen.  At last visit, I suggested to add metformin to hopefully limit the fluctuating CBGs throughout the day.  She did not start  this yet wanting to work more on optimizing her regimen.  However, sugars did not improve and she continues to have higher blood sugars in the morning and before lunch with significant improvement of CBGs before dinner and bedtime.  She had one low blood sugar at 45 in the last 2 weeks but it is not clear what happened at that time. -We tried in the past to split Tresiba in 2 doses but this did not help.  She had low blood sugars when she was taking [place] at night so we moved this in the morning. -At this visit, we again discussed about adding metformin and she agrees to start this.  We will use a low dose and we discussed about benefits, mechanism of action, and possible side effects.  She will start with 1 metformin ER tablet a day and will increase to 2 tablets with dinner.  We also discussed about other options, adding a GLP-1 receptor agonist, but I do not feel this is a good fit for her due to her history of gastroparesis and also due to the fact that her sugars improved later in the day. - I suggested to:  Patient Instructions  Please continue: - Tresiba 12 units in am - R insulin: - insulin to carb ratio (ICR)   B'fast: 15:1  Lunch: 20:1   Dinner: 20:1  - target 140 - insulin sensitivity factor (ISF) 60: 140-200: + 1 unit 201-260: + 2 units 261-320: + 3 units 321-380: + 4 units >380: + 5 units Do not correct bedtime sugars <300, and only inject 1 unit then.   Please start: - Metformin ER 500 mg with dinner. You can increase to 1000 mg if tolerated.  Please return in 4 months with your sugar log.   - today, HbA1c is 8% (higher) - continue checking sugars at different times of the day - check 4x a day, rotating checks - advised for yearly eye exams >> she is UTD - UTD with flu shot - Return to clinic in 4 mo with sugar log    2. HL - Reviewed latest lipid panel from a year ago: Improved. Lab Results  Component Value Date   CHOL 169 09/17/2017   HDL 78.00 09/17/2017    LDLCALC 76 09/17/2017   TRIG 74.0 09/17/2017   CHOLHDL 2 09/17/2017  -We tried multiple times to use statins but she developed severe muscle pains every time we tried, despite use of CoQ10, rotation of statins, and different muscle creams.  After last visit, she was planning to have muscle testing in 05/2018. -She is due for lipid panel but would like to defer this for now -We discussed about PCSK9 inhibitors-may need a referral to lipid clinic for this after the new lipid panel is back.   Carlus Pavlov, MD PhD [facility]

## 2018-09-04 NOTE — Patient Instructions (Addendum)
Please continue: - Tresiba 12 units in am - R insulin: - insulin to carb ratio (ICR)   B'fast: 15:1  Lunch: 20:1   Dinner: 20:1  - target 140 - insulin sensitivity factor (ISF) 60: 140-200: + 1 unit 201-260: + 2 units 261-320: + 3 units 321-380: + 4 units >380: + 5 units Do not correct bedtime sugars <300, and only inject 1 unit then.   Please start: - Metformin ER 500 mg with dinner. You can increase to 1000 mg if tolerated.  Please return in 4 months with your sugar log.

## 2018-09-09 ENCOUNTER — Other Ambulatory Visit: Payer: BLUE CROSS/BLUE SHIELD

## 2018-09-19 ENCOUNTER — Other Ambulatory Visit: Payer: Self-pay | Admitting: Internal Medicine

## 2018-10-13 ENCOUNTER — Other Ambulatory Visit: Payer: Self-pay | Admitting: Family Medicine

## 2018-10-13 NOTE — Telephone Encounter (Signed)
[facility] [place] request refill quinapril 5 mg. Refill has already been refilled. Nothing further needed,

## 2018-10-21 ENCOUNTER — Ambulatory Visit: Payer: BLUE CROSS/BLUE SHIELD | Admitting: Family Medicine

## 2019-01-07 ENCOUNTER — Ambulatory Visit: Payer: BLUE CROSS/BLUE SHIELD | Admitting: Internal Medicine

## 2019-01-08 ENCOUNTER — Telehealth: Payer: Self-pay | Admitting: Family Medicine

## 2019-01-08 NOTE — Telephone Encounter (Signed)
Noted.  Last HTN f/u was 11/19/17.  Do not see pt that pt has had a CPE.

## 2019-01-08 NOTE — Telephone Encounter (Addendum)
Noted. If won't schedule physical, at least recommend she schedule med refill visit.

## 2019-01-08 NOTE — Telephone Encounter (Signed)
E-scribed refill.  Pls schedule annual CPE. 

## 2019-01-08 NOTE — Telephone Encounter (Signed)
Pt stated she didn't have time to get a CPE and it shouldn't be need for her refill. Pt wouldn't schedule and stated she will schedule a 10 min visit

## 2019-01-08 NOTE — Telephone Encounter (Signed)
Left message asking pt to call office  °

## 2019-01-11 ENCOUNTER — Telehealth: Payer: Self-pay | Admitting: Internal Medicine

## 2019-01-11 NOTE — Telephone Encounter (Signed)
MEDICATION:   insulin regular (NOVOLIN R) 100 units/mL injection    PHARMACY:   [id number] - [place], [place] - [address]  IS THIS A 90 DAY SUPPLY :  yes  IS PATIENT OUT OF MEDICATION:  yes  IF NOT; HOW MUCH IS LEFT:   LAST APPOINTMENT DATE: @11 /16/2019  NEXT APPOINTMENT DATE:@5 /03/2019  DO WE HAVE YOUR PERMISSION TO LEAVE A DETAILED MESSAGE:  OTHER COMMENTS:    **Let patient know to contact pharmacy at the end of the day to make sure medication is ready. **  ** Please notify patient to allow 48-72 hours to process**  **Encourage patient to contact the pharmacy for refills or they can request refills through [facility]**

## 2019-01-12 MED ORDER — INSULIN REGULAR HUMAN 100 UNIT/ML IJ SOLN: 6.0000 [IU] | Freq: Three times a day (TID) | INTRAMUSCULAR | 0 refills | Status: DC

## 2019-01-12 NOTE — Telephone Encounter (Signed)
Patient was VERY upset that I called to schedule cpx.  She stated she had already talked to someone about this and she didn't understand why she needed a cpx.   She did finally schedule med refill appointment for 7/31

## 2019-01-12 NOTE — Telephone Encounter (Signed)
RX sent

## 2019-01-18 ENCOUNTER — Other Ambulatory Visit: Payer: Self-pay | Admitting: Internal Medicine

## 2019-03-08 ENCOUNTER — Encounter: Payer: Self-pay | Admitting: Internal Medicine

## 2019-03-09 ENCOUNTER — Encounter: Payer: Self-pay | Admitting: Internal Medicine

## 2019-03-09 ENCOUNTER — Ambulatory Visit (INDEPENDENT_AMBULATORY_CARE_PROVIDER_SITE_OTHER): Payer: BLUE CROSS/BLUE SHIELD | Admitting: Internal Medicine

## 2019-03-09 DIAGNOSIS — E103299 Type 1 diabetes mellitus with mild nonproliferative diabetic retinopathy without macular edema, unspecified eye: Secondary | ICD-10-CM

## 2019-03-09 DIAGNOSIS — E785 Hyperlipidemia, unspecified: Secondary | ICD-10-CM

## 2019-03-09 NOTE — Patient Instructions (Addendum)
Please continue: - Tresiba 10 units in am   Please change: - R insulin: - insulin to carb ratio (ICR)   B'fast: 15:1  Lunch: 20:1   Dinner: 20:1 >> 25:1 - target 140 - insulin sensitivity factor (ISF) 60: 140-200: + 1 unit 201-260: + 2 units 261-320: + 3 units 321-380: + 4 units >380: + 5 units Do not correct bedtime sugars <300, and only inject 1 unit then.   Please return in 4 months with your sugar log.

## 2019-03-09 NOTE — Progress Notes (Signed)
Patient ID: Felicia [name], female   DOB: 03/28/1961, 58 y.o.   MRN: [id number]  Patient location: Work My location: Office  Referring Provider: [name], MD  I connected with the patient on 03/09/19 at  1:35 PM EDT by a video enabled telemedicine application and verified that I am speaking with the correct person.   I discussed the limitations of evaluation and management by telemedicine and the availability of in person appointments. The patient expressed understanding and agreed to proceed.   Details of the encounter are shown below.  HPI: Felicia [name] is a 58 y.o.-year-old female, presenting for follow-up for DM1, dx in 13 (age 54), uncontrolled, with complications (mild retinopathy, gastroparesis). Last visit 6 months ago.  Last hemoglobin A1c was: Lab Results  Component Value Date   HGBA1C 8.0 (A) 09/04/2018   HGBA1C 7.7 (A) 04/17/2018   HGBA1C 7.5 12/16/2017   She was previously on an insulin pump for 5 years (approximately 2010) but she did not like it.  Sugars were not improved on this.  She is not interested in getting back on a pump and also refused a CGM.  She is on: - Tresiba 12 units in am >> 6-8 units in am and 2-3 at bedtime  - R insulin: - insulin to carb ratio (ICR)   B'fast: 15:1  Lunch: 20:1   Dinner: 20:1  - target 140 - insulin sensitivity factor (ISF) 60: 140-200: + 1 unit 201-260: + 2 units 261-320: + 3 units 321-380: + 4 units >380: + 5 units Do not correct bedtime sugars <300, and only inject 1 unit then.   Stopped: - Metformin ER 1000 mg with dinner - lack of effect  Tried Toujeo >> allergy: CP, SOB.  Meter: ReliOn  Pt checks her sugars 4-5 times a day per review of her log: - am:   71, 104-289, 312 >> 52, 59, 83-224, 342 >> 50, 66, 78-295, 339 - 2h after b'fast:  82 >> n/c >> 116-186, 471 >> 247 >> 68, 72 - before lunch: 73-221, 295, 383 >> 93-315, 422 >> 129--243, 320 - 2h after lunch:  226 >> n/c >> 87 >> 134 >> 209 -  before dinner: 76-185 >> 71-235, 363, 396 >> 113-284, 308 - 2h after dinner:   n/c >> 183 >> n/c >> 80-92 - bedtime: 63, 70-134, 288 >> 45, 67-138, 192, 340 >> 61-216, 320, 343 - nighttime:n/c >> 147 >> 46, 277 >> 56 >> 44 (overcorrection), 68, 94 Lowest sugar was 38 >> 46 at 2 am, x1 >> 45 >> 44; she has hypoglycemia awareness in the 70s.  No recent hypo-or hyperglycemia admission.  She does have a non-expired glucagon pen at home. Highest sugar was 502 (dried fruit) >> 471 >> 422 >> 343.   Pt's meals are: - Breakfast: protein bar + fruit - 10 am snack  - Lunch: apple + cheese, carrots - Snack bar: 15g carbs - Glucerna - Dinner: meat + 1/2 backed potato + veggies/salad - Snacks: popcorn, salty snacks: Potato chips  -No CKD, last BUN/creatinine:  Lab Results  Component Value Date   BUN 21 11/19/2017   CREATININE 0.93 11/19/2017  On quinapril 5. -+ HL; last set of lipids: Lab Results  Component Value Date   CHOL 169 09/17/2017   HDL 78.00 09/17/2017   LDLCALC 76 09/17/2017   TRIG 74.0 09/17/2017   CHOLHDL 2 09/17/2017  She continues on co-Q10.  In 04/2016, we started pravastatin, but she still developed  muscle cramps.  These were initially controlled with 2 creams: Theraworx + Muscle calm, but then she had to stop the statin because of increased muscle cramps.  However, before last visit, she restarted pravastatin 40 mg and she still has muscle cramps but they are tolerable. - last eye exam was in 2019: Mild ER-improved reportedly -No numbness and tingling in her feet.  Last TSH was normal Lab Results  Component Value Date   TSH 2.21 09/17/2017   She also has HTN.  ROS: Constitutional: no weight gain/no weight loss, no fatigue, no subjective hyperthermia, no subjective hypothermia Eyes: no blurry vision, no xerophthalmia ENT: no sore throat, no nodules palpated in neck, no dysphagia, no odynophagia, no hoarseness Cardiovascular: no CP/no SOB/no palpitations/no leg  swelling Respiratory: no cough/no SOB/no wheezing Gastrointestinal: no N/no V/no D/no C/no acid reflux Musculoskeletal: no muscle aches/no joint aches Skin: no rashes, no hair loss Neurological: no tremors/no numbness/no tingling/no dizziness  I reviewed pt's medications, allergies, PMH, social hx, family hx, and changes were documented in the history of present illness. Otherwise, unchanged from my initial visit note.  Past Medical History:  Diagnosis Date  . Allergy   . Anemia    past history long ago  . Disorder of kidney    has abd kidney (right)  . GERD (gastroesophageal reflux disease)   . History of diabetic gastroparesis   . HLD (hyperlipidemia)    borderline  . HTN (hypertension)    borderline  . Hx: UTI (urinary tract infection)   . Hypertension   . IBS (irritable bowel syndrome)   . Nephrolithiasis 2005  . Osteopenia 2012   per pt  . Systolic murmur 01/2013   mild mitral insuff  . Type 1 diabetes mellitus with diabetic retinopathy without macular edema (HCC)    dx at 58 y/o, background retinopathy   Past Surgical History:  Procedure Laterality Date  . CESAREAN SECTION  1985  . COLONOSCOPY  07/2016   WNL (Pyrtle)  . ESOPHAGOGASTRODUODENOSCOPY  07/2016   LA Grade C reflux esophagitis, concern for stasis/dysmotility (Pyrtle)  . LAPAROSCOPIC APPENDECTOMY N/A 09/01/2015   Procedure: APPENDECTOMY LAPAROSCOPIC;  Surgeon: Jimmye Norman, MD;  Location: [facility] OR;  Service: General;  Laterality: N/A;  . ORIF FEMUR FRACTURE Left 1996   Corrected by patient in history 2016  . PARTIAL HYSTERECTOMY  2000   paps by [facility] [place]  . TONSILLECTOMY AND ADENOIDECTOMY  1975  . TRIGGER FINGER RELEASE Left 12/2010   thumb   Social History   Socioeconomic History  . Marital status: Married    Spouse name: Not on file  . Number of children: 1  . Years of education: 75  . Highest education level: Bachelor's degree (e.g., BA, AB, BS)  Occupational History  . Occupation:  HR Chemical engineer: OTHER    Comment: [employer]  Social Needs  . Financial resource strain: Not on file  . Food insecurity:    Worry: Not on file    Inability: Not on file  . Transportation needs:    Medical: Not on file    Non-medical: Not on file  Tobacco Use  . Smoking status: Never Smoker  . Smokeless tobacco: Never Used  Substance and Sexual Activity  . Alcohol use: No    Alcohol/week: 0.0 standard drinks  . Drug use: No  . Sexual activity: Not on file  Lifestyle  . Physical activity:    Days per week: Not on file  Minutes per session: Not on file  . Stress: Not on file  Relationships  . Social connections:    Talks on phone: Not on file    Gets together: Not on file    Attends religious service: Not on file    Active member of club or organization: Not on file    Attends meetings of clubs or organizations: Not on file    Relationship status: Not on file  . Intimate partner violence:    Fear of current or ex partner: Not on file    Emotionally abused: Not on file    Physically abused: Not on file    Forced sexual activity: Not on file  Other Topics Concern  . 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  Has one daughter.  Education: college.      1 dog   Current Outpatient Medications on File Prior to Visit  Medication Sig Dispense Refill  . BD VEO INSULIN SYRINGE U/F 31G X 15/64" 0.3 ML MISC USE TO INJECT INSULIN 6 TIMES PER DAY 200 each 1  . esomeprazole (NEXIUM) 40 MG capsule Take 1 capsule (40 mg total) by mouth daily. 90 capsule 3  . Ferrous Sulfate (SLOW FE) 142 (45 Fe) MG TBCR Take 1 tablet by mouth every 3 (three) days. 30 tablet   . glucagon (GLUCAGON EMERGENCY) 1 MG injection Inject 1 mg into the muscle once as needed (in case of severe hypoglycemia). 1 each 12  . Insulin Pen Needle 32G X 6 MM MISC USE TO INJECT INSULIN 4 TIMES A DAY 200 each 0  .  insulin regular (NOVOLIN R) 100 units/mL injection Inject 0.06 mLs (6 Units total) into the skin 3 (three) times daily before meals. 30 mL 0  . metFORMIN (GLUCOPHAGE-XR) 500 MG 24 hr tablet Take 2 tablets (1,000 mg total) by mouth daily with breakfast. 60 tablet 11  . Multiple Vitamin (MULTIVITAMIN) tablet Take 1 tablet by mouth daily.      . pravastatin (PRAVACHOL) 40 MG tablet TAKE 1 TABLET (40 MG TOTAL) BY MOUTH DAILY. 90 tablet 3  . quinapril (ACCUPRIL) 5 MG tablet TAKE 1 TABLET BY MOUTH EVERY DAY 90 tablet 0  . TRESIBA FLEXTOUCH 100 UNIT/ML SOPN FlexTouch Pen INJECT 12 UNITS INTO THE SKIN DAILY BEFORE BREAKFAST. 15 pen 11   Current Facility-Administered Medications on File Prior to Visit  Medication Dose Route Frequency Provider Last Rate Last Dose  . 0.9 %  sodium chloride infusion  500 mL Intravenous Continuous Pyrtle, Carie Caddy, MD       Allergies  Allergen Reactions  . Protonix [Pantoprazole] Swelling    Facial Swelling  . Toujeo Solostar [Insulin Glargine] Shortness Of Breath  . Lactose Intolerance (Gi)   . Magnesium-Containing Compounds Swelling   Family History  Problem Relation Age of Onset  . Diabetes Maternal Grandmother   . Heart failure Maternal Grandmother   . COPD Maternal Grandmother        smoker  . Thyroid disease Maternal Grandmother   . Vascular Disease Maternal Grandmother   . Colon cancer Maternal Grandfather   . Colon polyps Mother   . Breast cancer Other   . Esophageal cancer Neg Hx   . Rectal cancer Neg Hx   . Stomach cancer Neg Hx    PE: There were no vitals taken for this visit. There is no  height or weight on file to calculate BMI.  Wt Readings from Last 3 Encounters:  09/04/18 135 lb (61.2 kg)  08/06/18 131 lb 8 oz (59.6 kg)  05/27/18 137 lb 2 oz (62.2 kg)   Constitutional:  in NAD  The physical exam was not performed (virtual visit).  ASSESSMENT: 1. DM1, uncontrolled, with complications - Mild DR - Gastroparesis - This is the reason why  she is on regular insulin, and she is taking it 10 minutes  rather than 30 minutes before eating  She refused to try a freestyle libre CGM.  She had low CBGs with Novolog!  2. HL  PLAN:  1. Patient with longstanding, uncontrolled, type 1 diabetes, on basal-bolus insulin regimen.  She tried a pump in the past but did not like it.  She would not like to retry another insulin pump or try a CGM. -At last visit, she continued to have higher blood sugars in the morning and before lunch with significant improvement of CBGs before dinner at that time.  We tried in the past different regimens of long-acting insulin including splitting it into doses, but this did not help.  She had low blood sugars overnight when she was taking [place] in the second half of the day so we moved this in the morning.  At last visit, we also discussed again about adding metformin.  I discussed about the mechanism of action, possible side effects, and also benefits.  We did not start a GLP-1 receptor agonist due to her history of gastroparesis and the fact that her sugars were improving towards the second half of the day. -At this visit we reviewed together her sugars from the last 2 weeks (sent to me through my chart in preparation for this visit): They are very fluctuating, but she has been adjusting her Tresiba doses during this whole time.  She tried splitting the dose again into a higher dose in the morning (6-10 units) and a low dose (2-4 units) at night.  In the last week, she has been taking only 10 units of Tresiba in the morning and none at night.  She usually woke up with high blood sugars except for 2 days in which her sugars were 50 and 78.  The sugars do improve during the day and they are lowest after dinner and during the night.  We discussed that this may be due to too much insulin with dinner and I advised her to decrease the insulin with this meal by increasing the ICR.  We discussed about the low blood sugars that  she obtained in the morning and this appeared after she corrected her sugars at night.  She forgot that we discussed about not correcting this sugars unless they are higher than 300 and then only use 1 unit.  We will go back to this practice.  It is also possible that her sugars later in the day are lower due to the fact that she has gastroparesis.  This is also the reason why I am reticent to start a GLP-1 receptor agonist. -I advised her to let me know about the sugars after she makes the above changes - I suggested to:  Patient Instructions  Please continue: - Tresiba 10 units in am   Please change: - R insulin: - insulin to carb ratio (ICR)   B'fast: 15:1  Lunch: 20:1   Dinner: 20:1 >> 25:1 - target 140 - insulin sensitivity factor (ISF) 60: 140-200: + 1 unit 201-260: + 2  units 261-320: + 3 units 321-380: + 4 units >380: + 5 units Do not correct bedtime sugars <300, and only inject 1 unit then.   Please return in 4 months with your sugar log.     2. HL - Reviewed latest lipid panel from 2018: Improved Lab Results  Component Value Date   CHOL 169 09/17/2017   HDL 78.00 09/17/2017   LDLCALC 76 09/17/2017   TRIG 74.0 09/17/2017   CHOLHDL 2 09/17/2017  -We tried multiple times to use statins but she developed severe muscle pains every time we tried, despite use of CoQ10, rotation of statins, and different muscle creams.  However, she was started on pravastatin by PCP and she is now on 40 mg daily.  She still has muscle cramps but tolerable. -At last visit, she wanted to defer lipid testing -we will need to check this at next visit  - time spent with the patient: 25 min, of which >50% was spent in reviewing her sugars and insulin doses, discussing her hypo- and hyper-glycemic episodes, reviewing previous labs and developing a plan to avoid hypo- and hyper-glycemia.   Carlus Pavlov, MD PhD [facility] Endocrinology

## 2019-03-17 DIAGNOSIS — B373 Candidiasis of vulva and vagina: Secondary | ICD-10-CM | POA: Diagnosis not present

## 2019-03-17 DIAGNOSIS — E119 Type 2 diabetes mellitus without complications: Secondary | ICD-10-CM | POA: Diagnosis not present

## 2019-04-02 ENCOUNTER — Other Ambulatory Visit: Payer: Self-pay | Admitting: Family Medicine

## 2019-04-07 ENCOUNTER — Other Ambulatory Visit: Payer: Self-pay | Admitting: Physician Assistant

## 2019-04-13 DIAGNOSIS — M65332 Trigger finger, left middle finger: Secondary | ICD-10-CM | POA: Diagnosis not present

## 2019-04-13 DIAGNOSIS — M65321 Trigger finger, right index finger: Secondary | ICD-10-CM | POA: Diagnosis not present

## 2019-04-13 DIAGNOSIS — M65322 Trigger finger, left index finger: Secondary | ICD-10-CM | POA: Diagnosis not present

## 2019-04-24 ENCOUNTER — Other Ambulatory Visit: Payer: Self-pay | Admitting: Physician Assistant

## 2019-04-26 DIAGNOSIS — M65321 Trigger finger, right index finger: Secondary | ICD-10-CM | POA: Diagnosis not present

## 2019-04-26 DIAGNOSIS — M65332 Trigger finger, left middle finger: Secondary | ICD-10-CM | POA: Diagnosis not present

## 2019-04-26 DIAGNOSIS — M65322 Trigger finger, left index finger: Secondary | ICD-10-CM | POA: Diagnosis not present

## 2019-05-15 ENCOUNTER — Other Ambulatory Visit: Payer: Self-pay | Admitting: Internal Medicine

## 2019-05-23 ENCOUNTER — Other Ambulatory Visit: Payer: Self-pay | Admitting: Internal Medicine

## 2019-06-03 ENCOUNTER — Telehealth: Payer: Self-pay

## 2019-06-03 NOTE — Telephone Encounter (Signed)
Spoke with pt attempting to review/collect info for tomorrow's 7:15 virtual visit.  Pt is states she does not understand why she needs a f/u and or CPE to get refills on meds.  Says she sees endo regularly and has vitals done with them, "which I'm sure Dr. Darnell Level can see."  Pt wants to know if she monitors her BP (ie., at pharmacy or [facility], etc.) and report readings to Dr. Darnell Level, does she still need to have appts.  Pls advise.  Pt wants to c/x appt until response from Dr. Lenon Curt to Dr. Darnell Level.   Appt c/x.

## 2019-06-04 ENCOUNTER — Ambulatory Visit: Payer: BC Managed Care – PPO | Admitting: Family Medicine

## 2019-06-04 NOTE — Telephone Encounter (Signed)
Spoke with pt scheduling fasting labs on 06/17/19 at 7:40 and virtual visit on 06/28/19 at 8:00.

## 2019-06-04 NOTE — Telephone Encounter (Signed)
Spoke with patient, reviewed reasons for med refill visit - she agrees to this. She desires to postpone physical until after covid.  Felicia [name] plz call pt to schedule fasting lab visit and then virtual visit for med refill visit in the next few weeks.  Thank you.

## 2019-06-16 ENCOUNTER — Other Ambulatory Visit: Payer: Self-pay | Admitting: Family Medicine

## 2019-06-16 DIAGNOSIS — E103299 Type 1 diabetes mellitus with mild nonproliferative diabetic retinopathy without macular edema, unspecified eye: Secondary | ICD-10-CM

## 2019-06-16 DIAGNOSIS — E785 Hyperlipidemia, unspecified: Secondary | ICD-10-CM

## 2019-06-17 ENCOUNTER — Other Ambulatory Visit: Payer: Self-pay

## 2019-06-17 ENCOUNTER — Other Ambulatory Visit (INDEPENDENT_AMBULATORY_CARE_PROVIDER_SITE_OTHER): Payer: BC Managed Care – PPO

## 2019-06-17 DIAGNOSIS — E103299 Type 1 diabetes mellitus with mild nonproliferative diabetic retinopathy without macular edema, unspecified eye: Secondary | ICD-10-CM | POA: Diagnosis not present

## 2019-06-17 DIAGNOSIS — E785 Hyperlipidemia, unspecified: Secondary | ICD-10-CM | POA: Diagnosis not present

## 2019-06-17 LAB — HEMOGLOBIN A1C: Hgb A1c MFr Bld: 9 % — ABNORMAL HIGH (ref 4.6–6.5)

## 2019-06-17 LAB — LIPID PANEL
Cholesterol: 218 mg/dL — ABNORMAL HIGH (ref 0–200)
HDL: 62.7 mg/dL (ref >39.00)
NonHDL: 155.32
Total CHOL/HDL Ratio: 3
Triglycerides: 211 mg/dL — ABNORMAL HIGH (ref 0.0–149.0)
VLDL: 42.2 mg/dL — ABNORMAL HIGH (ref 0.0–40.0)

## 2019-06-17 LAB — COMPREHENSIVE METABOLIC PANEL
ALT: 15 U/L (ref 0–35)
AST: 21 U/L (ref 0–37)
Albumin: 4.1 g/dL (ref 3.5–5.2)
Alkaline Phosphatase: 68 U/L (ref 39–117)
BUN: 15 mg/dL (ref 6–23)
CO2: 30 mEq/L (ref 19–32)
Calcium: 9.5 mg/dL (ref 8.4–10.5)
Chloride: 100 mEq/L (ref 96–112)
Creatinine, Ser: 0.92 mg/dL (ref 0.40–1.20)
GFR: 63.07 mL/min (ref >60.00)
Glucose, Bld: 226 mg/dL — ABNORMAL HIGH (ref 70–99)
Potassium: 4.4 mEq/L (ref 3.5–5.1)
Sodium: 136 mEq/L (ref 135–145)
Total Bilirubin: 0.4 mg/dL (ref 0.2–1.2)
Total Protein: 7.1 g/dL (ref 6.0–8.3)

## 2019-06-17 LAB — LDL CHOLESTEROL, DIRECT: Direct LDL: 120 mg/dL

## 2019-06-22 ENCOUNTER — Encounter: Payer: Self-pay | Admitting: Internal Medicine

## 2019-06-28 ENCOUNTER — Telehealth: Payer: Self-pay

## 2019-06-28 ENCOUNTER — Other Ambulatory Visit: Payer: Self-pay

## 2019-06-28 ENCOUNTER — Encounter: Payer: Self-pay | Admitting: Family Medicine

## 2019-06-28 ENCOUNTER — Telehealth (INDEPENDENT_AMBULATORY_CARE_PROVIDER_SITE_OTHER): Payer: BC Managed Care – PPO | Admitting: Family Medicine

## 2019-06-28 VITALS — BP 127/72 | HR 82 | Ht 64.0 in | Wt 129.0 lb

## 2019-06-28 DIAGNOSIS — R1314 Dysphagia, pharyngoesophageal phase: Secondary | ICD-10-CM

## 2019-06-28 DIAGNOSIS — E785 Hyperlipidemia, unspecified: Secondary | ICD-10-CM | POA: Diagnosis not present

## 2019-06-28 DIAGNOSIS — E1043 Type 1 diabetes mellitus with diabetic autonomic (poly)neuropathy: Secondary | ICD-10-CM

## 2019-06-28 DIAGNOSIS — K2100 Gastro-esophageal reflux disease with esophagitis, without bleeding: Secondary | ICD-10-CM | POA: Insufficient documentation

## 2019-06-28 DIAGNOSIS — I10 Essential (primary) hypertension: Secondary | ICD-10-CM

## 2019-06-28 DIAGNOSIS — K3184 Gastroparesis: Secondary | ICD-10-CM

## 2019-06-28 DIAGNOSIS — E103299 Type 1 diabetes mellitus with mild nonproliferative diabetic retinopathy without macular edema, unspecified eye: Secondary | ICD-10-CM | POA: Diagnosis not present

## 2019-06-28 MED ORDER — PRAVASTATIN SODIUM 40 MG PO TABS: 60.0000 mg | ORAL_TABLET | Freq: Every day | ORAL | 1 refills | Status: DC

## 2019-06-28 MED ORDER — QUINAPRIL HCL 5 MG PO TABS: 5.0000 mg | ORAL_TABLET | Freq: Every day | ORAL | 3 refills | Status: DC

## 2019-06-28 MED ORDER — INSULIN REGULAR HUMAN 100 UNIT/ML IJ SOLN: INTRAMUSCULAR | 2 refills | Status: DC

## 2019-06-28 MED ORDER — PRAVASTATIN SODIUM 40 MG PO TABS: 40.0000 mg | ORAL_TABLET | Freq: Every day | ORAL | 3 refills | Status: DC

## 2019-06-28 MED ORDER — TRESIBA FLEXTOUCH 100 UNIT/ML ~~LOC~~ SOPN: 12.0000 [IU] | PEN_INJECTOR | Freq: Every day | SUBCUTANEOUS | 11 refills | Status: DC

## 2019-06-28 NOTE — Assessment & Plan Note (Signed)
By EGD. Doesn't feel nexium is effective. May switch to pepcid OTC.

## 2019-06-28 NOTE — Telephone Encounter (Signed)
Both have been sent. 

## 2019-06-28 NOTE — Assessment & Plan Note (Addendum)
Chronic, stable on quinapril.

## 2019-06-28 NOTE — Assessment & Plan Note (Signed)
Ongoing trouble - to pastas and bread. EGD showing reflux esophagitis with evidence of esophageal dysmotility. Pt doesn't feel nexium has really helped. Discussed pepcid and return to GI when pandemic stabilizing.

## 2019-06-28 NOTE — Telephone Encounter (Signed)
-----   Message from Philemon Kingdom, MD sent at 06/28/2019  8:44 AM EDT ----- Lenna Sciara, [place] to refill these. Thank you, C ----- Message ----- From: Ria Bush, MD Sent: 06/28/2019   8:24 AM EDT To: Philemon Kingdom, MD  Maryanna Shape, I saw above patient today for virtual visit and she requested insulin refills (tresiba and novolin R).  I told her I'd forward you her request.  Hope you're well, -Garlon Hatchet

## 2019-06-28 NOTE — Progress Notes (Signed)
Virtual visit completed through [employer]. Due to national recommendations of social distancing due to COVID-19, a virtual visit is felt to be most appropriate for this patient at this [employer]. Reviewed limitations of a virtual visit. Interactive audio and video telecommunications were attempted between myself and Felicia DallyDebra L Rodriques, however failed due to patient not having access to video capability. We continued and completed visit with audio only.   Patient location: home Provider location: [facility], office If any vitals were documented, they were collected by patient at home unless specified below.    BP 127/72   Pulse 82   Ht 5\' 4"  (1.626 m)   Wt 129 lb (58.5 kg)   BMI 22.14 kg/m    CC: med refill visit Subjective:    Patient ID: Felicia DallyDebra L Starner, female    DOB: [birth year], 58 y.o.   MRN: [id number]  HPI: Felicia DallyDebra L Spoto is a 58 y.o. female presenting on 06/28/2019 for Medication Refill (Requests refills for BP and DM meds. )   T1DM followed by endocrinology. Requests insulin refilled. Will send note to Dr Elvera LennoxGherghe. On tresiba and novolin R.   HLD - taking pravastatin 40mg  daily in the morning. Reports ongoing myalgias, not improved with coQ10. Stopping statin never really resolved muscle aches. LDL above goal. Agrees to trial higher dose pravastatin. Also had trouble with crestor in the past.   Dysphagia - EGD 2017 with evidence of reflux esophagitis and dysmotility ?DM related. Takes nexium 40mg  daily, doesn't feel it is beneficial. Wonders about stopping PPI.   Preventative: Colonoscopy 2017  [facility] - regularly sees Dr Billy Coastaavon.      Relevant past medical, surgical, family and social history reviewed and updated as indicated. Interim medical history since our last visit reviewed. Allergies and medications reviewed and updated. Outpatient Medications Prior to Visit  Medication Sig Dispense Refill  . BD VEO INSULIN SYRINGE U/F 31G X 15/64" 0.3 ML  MISC USE TO INJECT INSULIN 6 TIMES PER DAY 200 each 1  . esomeprazole (NEXIUM) 40 MG capsule Take 1 capsule (40 mg total) by mouth daily. Patient needs office visit for further refills 90 capsule 0  . glucagon (GLUCAGON EMERGENCY) 1 MG injection Inject 1 mg into the muscle once as needed (in case of severe hypoglycemia). 1 each 12  . Insulin Pen Needle (NOVOFINE) 32G X 6 MM MISC USE TO INJECT INSULIN 4 TIMES A DAY 200 each 1  . Multiple Vitamin (MULTIVITAMIN) tablet Take 1 tablet by mouth daily.      Marland Kitchen. NOVOLIN R 100 UNIT/ML injection INJECT 6 UNITS TOTAL INTO THE SKIN 3 (THREE) TIMES DAILY BEFORE MEALS. 10 mL 2  . TRESIBA FLEXTOUCH 100 UNIT/ML SOPN FlexTouch Pen INJECT 12 UNITS INTO THE SKIN DAILY BEFORE BREAKFAST. 15 pen 11  . pravastatin (PRAVACHOL) 40 MG tablet TAKE 1 TABLET (40 MG TOTAL) BY MOUTH DAILY. 90 tablet 3  . quinapril (ACCUPRIL) 5 MG tablet TAKE 1 TABLET BY MOUTH EVERY DAY 90 tablet 0  . Ferrous Sulfate (SLOW FE) 142 (45 Fe) MG TBCR Take 1 tablet by mouth every 3 (three) days. 30 tablet   . metFORMIN (GLUCOPHAGE-XR) 500 MG 24 hr tablet Take 2 tablets (1,000 mg total) by mouth daily with breakfast. 60 tablet 11   Facility-Administered Medications Prior to Visit  Medication Dose Route Frequency Provider Last Rate Last Dose  . 0.9 %  sodium chloride infusion  500 mL Intravenous Continuous Pyrtle, Carie CaddyJay M, MD  Per HPI unless specifically indicated in ROS section below Review of Systems Objective:    BP 127/72   Pulse 82   Ht 5\' 4"  (1.626 m)   Wt 129 lb (58.5 kg)   BMI 22.14 kg/m   Wt Readings from Last 3 Encounters:  06/28/19 129 lb (58.5 kg)  09/04/18 135 lb (61.2 kg)  08/06/18 131 lb 8 oz (59.6 kg)     Physical exam: Gen: alert, NAD, not ill appearing Pulm: speaks in complete sentences without increased work of breathing Psych: normal mood, normal thought content      Results for orders placed or performed in visit on 06/17/19  Hemoglobin A1c  Result Value  Ref Range   Hgb A1c MFr Bld 9.0 (H) 4.6 - 6.5 %  Comprehensive metabolic panel  Result Value Ref Range   Sodium 136 135 - 145 mEq/L   Potassium 4.4 3.5 - 5.1 mEq/L   Chloride 100 96 - 112 mEq/L   CO2 30 19 - 32 mEq/L   Glucose, Bld 226 (H) 70 - 99 mg/dL   BUN 15 6 - 23 mg/dL   Creatinine, Ser 2.840.92 0.40 - 1.20 mg/dL   Total Bilirubin 0.4 0.2 - 1.2 mg/dL   Alkaline Phosphatase 68 39 - 117 U/L   AST 21 0 - 37 U/L   ALT 15 0 - 35 U/L   Total Protein 7.1 6.0 - 8.3 g/dL   Albumin 4.1 3.5 - 5.2 g/dL   Calcium 9.5 8.4 - 13.210.5 mg/dL   GFR 44.0163.07 >02.72>60.00 mL/min  Lipid panel  Result Value Ref Range   Cholesterol 218 (H) 0 - 200 mg/dL   Triglycerides 536.6211.0 (H) 0.0 - 149.0 mg/dL   HDL 44.0362.70 >47.42>39.00 mg/dL   VLDL 59.542.2 (H) 0.0 - 63.840.0 mg/dL   Total CHOL/HDL Ratio 3    NonHDL 155.32   LDL cholesterol, direct  Result Value Ref Range   Direct LDL 120.0 mg/dL   Assessment & Plan:   Problem List Items Addressed This Visit    Type 1 diabetes mellitus with background diabetic retinopathy (HCC)   Relevant Medications   quinapril (ACCUPRIL) 5 MG tablet   pravastatin (PRAVACHOL) 40 MG tablet   Reflux esophagitis    By EGD. Doesn't feel nexium is effective. May switch to pepcid OTC.       Hyperlipidemia - Primary    Chronic, LDL above goal. Intolerance to several prior statins tried. Agrees to incrementally increase pravastatin - will start at 60mg  daily x3 months then recheck levels. Encouraged she take statin at night time.       Relevant Medications   quinapril (ACCUPRIL) 5 MG tablet   pravastatin (PRAVACHOL) 40 MG tablet   Essential hypertension    Chronic, stable on quinapril.       Relevant Medications   quinapril (ACCUPRIL) 5 MG tablet   pravastatin (PRAVACHOL) 40 MG tablet   Dysphagia    Ongoing trouble - to pastas and bread. EGD showing reflux esophagitis with evidence of esophageal dysmotility. Pt doesn't feel nexium has really helped. Discussed pepcid and return to GI when  pandemic stabilizing.       Diabetic gastroparesis associated with type 1 diabetes mellitus (HCC)   Relevant Medications   quinapril (ACCUPRIL) 5 MG tablet   pravastatin (PRAVACHOL) 40 MG tablet       Meds ordered this encounter  Medications  . DISCONTD: pravastatin (PRAVACHOL) 40 MG tablet    Sig: Take 1 tablet (40 mg total) by mouth  daily.    Dispense:  90 tablet    Refill:  3  . quinapril (ACCUPRIL) 5 MG tablet    Sig: Take 1 tablet (5 mg total) by mouth daily.    Dispense:  90 tablet    Refill:  3  . pravastatin (PRAVACHOL) 40 MG tablet    Sig: Take 1.5 tablets (60 mg total) by mouth daily.    Dispense:  135 tablet    Refill:  1   No orders of the defined types were placed in this encounter.   I discussed the assessment and treatment plan with the patient. The patient was provided an opportunity to ask questions and all were answered. The patient agreed with the plan and demonstrated an understanding of the instructions. The patient was advised to call back or seek an in-person evaluation if the symptoms worsen or if the condition fails to improve as anticipated.  Follow up plan: No follow-ups on file.  Ria Bush, MD

## 2019-06-28 NOTE — Assessment & Plan Note (Addendum)
Chronic, LDL above goal. Intolerance to several prior statins tried. Agrees to incrementally increase pravastatin - will start at 60mg  daily x3 months then recheck levels. Encouraged she take statin at night time.

## 2019-07-14 ENCOUNTER — Other Ambulatory Visit: Payer: Self-pay

## 2019-07-14 ENCOUNTER — Encounter: Payer: Self-pay | Admitting: Internal Medicine

## 2019-07-15 ENCOUNTER — Encounter: Payer: Self-pay | Admitting: Internal Medicine

## 2019-07-15 ENCOUNTER — Ambulatory Visit (INDEPENDENT_AMBULATORY_CARE_PROVIDER_SITE_OTHER): Payer: BC Managed Care – PPO | Admitting: Internal Medicine

## 2019-07-15 DIAGNOSIS — E103299 Type 1 diabetes mellitus with mild nonproliferative diabetic retinopathy without macular edema, unspecified eye: Secondary | ICD-10-CM

## 2019-07-15 DIAGNOSIS — E785 Hyperlipidemia, unspecified: Secondary | ICD-10-CM | POA: Diagnosis not present

## 2019-07-15 NOTE — Patient Instructions (Signed)
Please change: - Tresiba 6 units in am and 6 units at bedtime  Please change: - R insulin: - insulin to carb ratio (ICR)   B'fast: 15:1  Lunch: 20:1   Dinner: 25:1 (no more than 2-3 units) - target 140 - insulin sensitivity factor (ISF) 60: 140-200: + 1 unit 201-260: + 2 units 261-320: + 3 units 321-380: + 4 units >380: + 5 units Do not correct bedtime sugars <300, and only inject 1 unit then.   Please schedule an appt with Leonia Reader for diabetes education.  Please return in 3-4 months with your sugar log.

## 2019-07-15 NOTE — Progress Notes (Signed)
Patient ID: Felicia [name], female   DOB: 11-19-1960, 58 y.o.   MRN: [id number]  Patient location: Home My location: Office  Referring Provider: Ria Bush, MD  I connected with the patient on 07/15/19 at  8:00 AM EDT by a video enabled telemedicine application and verified that I am speaking with the correct person.   I discussed the limitations of evaluation and management by telemedicine and the availability of in person appointments. The patient expressed understanding and agreed to proceed.   Details of the encounter are shown below.  HPI: Felicia [name] is a 58 y.o.-year-old female, presenting for follow-up for DM1, dx in 34 (age 74), uncontrolled, with complications (mild retinopathy, gastroparesis). Last visit 4 months ago (virtual).  Patient sugars are more fluctuating now causing her a lot of frustration.  A coworker had his leg amputated recently and she is very scared about diabetes complications for herself.  Last hemoglobin A1c was higher: Lab Results  Component Value Date   HGBA1C 9.0 (H) 06/17/2019   HGBA1C 8.0 (A) 09/04/2018   HGBA1C 7.7 (A) 04/17/2018   She was previously on an insulin pump for 5 years (approximately 2010) but she did not like it.  Sugars were not improved on this.  She is not interested in getting back on the pump and also refused the CGM  She is on: - Tresiba 12 units in am >> 6-8 units in am >> 10 units in a.m. >> 10 in am and 2 at night - R insulin: - insulin to carb ratio (ICR)   B'fast: 15:1  Lunch: 20:1   Dinner: 20:1 >> 25:1 - target 140 - insulin sensitivity factor (ISF) 60: 140-200: + 1 unit 201-260: + 2 units 261-320: + 3 units 321-380: + 4 units >380: + 5 units Do not correct bedtime sugars <300, and only inject 1 unit then.   She stopped: - Metformin ER 1000 mg with dinner -due to lack of effect  Tried Toujeo >> allergy: CP, SOB.  Meter: ReliOn  Pt checks her sugars 4-5 times a day per review of her log  (when taking Tresiba in am vs am + hs - in blue): - am:   52, 59, 83-224, 342 >> 50, 66, 78-295, 339 >> 129, 166-291/143-290, 300 - 2h after b'fast:  116-186, 471 >> 247 >> 68, 72 >> n/c - before lunch:  93-315, 422 >> 129--243, 320 >> 71, 87-222, 316, 397/76, 146-202, 256, 372 - 2h after lunch:  226 >> n/c >> 87 >> 134 >> 209 >> 70 - before dinner: 71-235, 363, 396 >> 113-284, 308 >> 83, 109- 273, 360, 401/101-265, 328 - 2h after dinner:   n/c >> 183 >> n/c >> 80-92 >> 83, 89, 96 >> n/c - bedtime:  45, 67-138, 192, 340 >> 61-216, 320, 343 >> 66-183, 276/ 53, 74-165 - nighttime: 147 >> 46, 277 >> 56 >> 44 (overcorrection), 68, 94 >> 70- 274 >> 44, 50, 311 Lowest sugar was 38 >>... 44 >> 44; she has hypoglycemia awareness in the  70s.  No recent hypo-or hyperglycemia admissions.  She has a non-expired glucagon kit at home. Highest sugar was 502 (dried fruit) .Marland Kitchen.343 >> 401.   Pt's meals are: - Breakfast: protein bar + fruit - 10 am snack  - Lunch: apple + cheese, carrots - Snack bar: 15g carbs - Glucerna - Dinner: meat + 1/2 backed potato + veggies/salad - Snacks: popcorn, salty snacks: Potato chips  -No CKD, last BUN/creatinine:  Lab Results  Component Value Date   BUN 15 06/17/2019   CREATININE 0.92 06/17/2019  On quinapril 5. -+ HL; last set of lipids: Lab Results  Component Value Date   CHOL 218 (H) 06/17/2019   HDL 62.70 06/17/2019   LDLCALC 76 09/17/2017   LDLDIRECT 120.0 06/17/2019   TRIG 211.0 (H) 06/17/2019   CHOLHDL 3 06/17/2019  She continues on co-Q10.  In 04/2016, we started pravastatin, but she still developed muscle cramps.  These were initially controlled with 2 creams: Theraworx + Muscle calm, but then she had to stop the statin because of increased muscle cramps.  However, she is now pravastatin 40 mg daily and she still has muscle cramps but these are tolerable - last eye exam was in 2019: Mild DR -improved reportedly -No numbness and tingling in her  feet.  Latest TSH was reviewed and it was normal: Lab Results  Component Value Date   TSH 2.21 09/17/2017   She also has a history of hypertension.  ROS: Constitutional: no weight gain/no weight loss, no fatigue, no subjective hyperthermia, no subjective hypothermia Eyes: no blurry vision, no xerophthalmia ENT: no sore throat, no nodules palpated in neck, no dysphagia, no odynophagia, no hoarseness Cardiovascular: no CP/no SOB/no palpitations/no leg swelling Respiratory: no cough/no SOB/no wheezing Gastrointestinal: no N/no V/no D/no C/no acid reflux Musculoskeletal: no muscle aches/no joint aches Skin: no rashes, no hair loss Neurological: no tremors/no numbness/no tingling/no dizziness  I reviewed pt's medications, allergies, PMH, social hx, family hx, and changes were documented in the history of present illness. Otherwise, unchanged from my initial visit note.  Past Medical History:  Diagnosis Date  . Allergy   . Anemia    past history long ago  . Disorder of kidney    has abd kidney (right)  . GERD (gastroesophageal reflux disease)   . History of diabetic gastroparesis   . HLD (hyperlipidemia)    borderline  . HTN (hypertension)    borderline  . Hx: UTI (urinary tract infection)   . Hypertension   . IBS (irritable bowel syndrome)   . Nephrolithiasis 2005  . Osteopenia 2012   per pt  . Systolic murmur 05/4258   mild mitral insuff  . Type 1 diabetes mellitus with diabetic retinopathy without macular edema (HCC)    dx at 59 y/o, background retinopathy   Past Surgical History:  Procedure Laterality Date  . CESAREAN SECTION  1985  . COLONOSCOPY  07/2016   WNL (Pyrtle)  . ESOPHAGOGASTRODUODENOSCOPY  07/2016   LA Grade C reflux esophagitis, concern for stasis/dysmotility (Pyrtle)  . LAPAROSCOPIC APPENDECTOMY N/A 09/01/2015   Procedure: APPENDECTOMY LAPAROSCOPIC;  Surgeon: Judeth Horn, MD;  Location: [facility];  Service: General;  Laterality: N/A;  . ORIF FEMUR FRACTURE  Left 1996   Corrected by patient in history 2016  . PARTIAL HYSTERECTOMY  2000   paps by [facility] [place]  . TONSILLECTOMY AND ADENOIDECTOMY  1975  . TRIGGER FINGER RELEASE Left 12/2010   thumb   Social History   Socioeconomic History  . Marital status: Married    Spouse name: Not on file  . Number of children: 1  . Years of education: 69  . Highest education level: Bachelor's degree (e.g., BA, AB, BS)  Occupational History  . Occupation: HR Marketing executive: OTHER    Comment: [employer]  . Financial resource strain: Not on file  . Food insecurity    Worry: Not on file  Inability: Not on file  . Transportation needs    Medical: Not on file    Non-medical: Not on file  Tobacco Use  . Smoking status: Never Smoker  . Smokeless tobacco: Never Used  Substance and Sexual Activity  . Alcohol use: No    Alcohol/week: 0.0 standard drinks  . Drug use: No  . Sexual activity: Not on file  Lifestyle  . Physical activity    Days per week: Not on file    Minutes per session: Not on file  . Stress: Not on file  Relationships  . Social Herbalist on phone: Not on file    Gets together: Not on file    Attends religious service: Not on file    Active member of club or organization: Not on file    Attends meetings of clubs or organizations: Not on file    Relationship status: Not on file  . Intimate partner violence    Fear of current or ex partner: Not on file    Emotionally abused: Not on file    Physically abused: Not on file    Forced sexual activity: Not on file  Other Topics Concern  . 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  Has one daughter.  Education: college.      1 dog   Current Outpatient Medications on File Prior to Visit  Medication Sig Dispense Refill  . BD VEO INSULIN SYRINGE U/F 31G X 15/64" 0.3 ML MISC USE TO INJECT  INSULIN 6 TIMES PER DAY 200 each 1  . esomeprazole (NEXIUM) 40 MG capsule Take 1 capsule (40 mg total) by mouth daily. Patient needs office visit for further refills 90 capsule 0  . glucagon (GLUCAGON EMERGENCY) 1 MG injection Inject 1 mg into the muscle once as needed (in case of severe hypoglycemia). 1 each 12  . insulin degludec (TRESIBA FLEXTOUCH) 100 UNIT/ML SOPN FlexTouch Pen Inject 0.12 mLs (12 Units total) into the skin daily before breakfast. 15 pen 11  . Insulin Pen Needle (NOVOFINE) 32G X 6 MM MISC USE TO INJECT INSULIN 4 TIMES A DAY 200 each 1  . insulin regular (NOVOLIN R) 100 units/mL injection INJECT 6 UNITS TOTAL INTO THE SKIN 3 (THREE) TIMES DAILY BEFORE MEALS. 10 mL 2  . Multiple Vitamin (MULTIVITAMIN) tablet Take 1 tablet by mouth daily.      . pravastatin (PRAVACHOL) 40 MG tablet Take 1.5 tablets (60 mg total) by mouth daily. 135 tablet 1  . quinapril (ACCUPRIL) 5 MG tablet Take 1 tablet (5 mg total) by mouth daily. 90 tablet 3   Current Facility-Administered Medications on File Prior to Visit  Medication Dose Route Frequency Provider Last Rate Last Dose  . 0.9 %  sodium chloride infusion  500 mL Intravenous Continuous Pyrtle, Lajuan Lines, MD       Allergies  Allergen Reactions  . Protonix [Pantoprazole] Swelling    Facial Swelling  . Toujeo Solostar [Insulin Glargine] Shortness Of Breath  . Lactose Intolerance (Gi)   . Magnesium-Containing Compounds Swelling   Family History  Problem Relation Age of Onset  . Diabetes Maternal Grandmother   . Heart failure Maternal Grandmother   . COPD Maternal Grandmother        smoker  . Thyroid disease Maternal Grandmother   . Vascular Disease Maternal Grandmother   . Colon  cancer Maternal Grandfather   . Colon polyps Mother   . Breast cancer Other   . Esophageal cancer Neg Hx   . Rectal cancer Neg Hx   . Stomach cancer Neg Hx    PE: There were no vitals taken for this visit. There is no height or weight on file to calculate  BMI.  Wt Readings from Last 3 Encounters:  06/28/19 129 lb (58.5 kg)  09/04/18 135 lb (61.2 kg)  08/06/18 131 lb 8 oz (59.6 kg)   Constitutional:  in NAD  The physical exam was not performed (virtual visit).  ASSESSMENT: 1. DM1, uncontrolled, with complications - Mild DR - Gastroparesis - This is the reason why she is on regular insulin, and she is taking it 10 minutes  rather than 30 minutes before eating  She refused to try a freestyle libre CGM.  She had low CBGs with Novolog!  2. HL  PLAN:  1. Patient with longstanding, uncontrolled, type 1 diabetes, on basal-bolus insulin regimen.  She tried an insulin pump before but did not like it.  She would not want to retry this or try a CGM.  We also started metformin in the past but she stopped due to lack of effect.  She has gastroparesis so I did not suggest a GLP-1 receptor agonist. -Her sugars are very fluctuating and we are limited by the fact that she can have either hyperglycemia or hypoglycemia without apparent culprit.  She is currently taking a low dose of Tresiba in the morning as in the past she was dropping her sugars during the night.  Even now, she occasionally has low blood sugars in the 60s and 70s at bedtime or at night.  Before last visit, she can try to split Tresiba to take a higher dose in the morning (6 to 10 units) a low dose at night (2 to 4 units), however, she still has lows during the night so she started to take only 10 units of Tresiba in the morning.  She continues on this regimen now.  The sugars do improve towards the end of the day and they are almost all at goal at bedtime.  We discussed in the past about not correcting high blood sugars at bedtime to avoid hypoglycemia during the night.  She knows to correct only if the sugars are higher than 300 and then only use 1 unit.  At last visit we also changed her ICR to a less strict one with dinner. -At this visit, sugars are almost always high in the morning and  have no particular check later in the day, except being almost all at or close to goal at bedtime.  They did improve a little after adding a low dose of Tresiba at night, but not significantly so.  She continues to have occasional low blood sugars at night and we discussed about reducing insulin with dinner.  I advised her to take no more than 2 and maximum 3 units with this meal.  I reiterated the fact that she is slowly correct blood sugars that are higher than 300s at bedtime and only take 1 unit of insulin then.  In the meantime, I advised her to split the [place] into even doses of 60 units twice a day to help with morning sugars. -We discussed that if this does not improve her sugars, I would definitely suggest an insulin pump.  At this point, she is open to the suggestion and I advised her to call the  insurance and check which pumps are covered.  She will do so.  I will also refer her to diabetes education to discuss different times and options for CGM's, in which she is also interested. -We also discussed that she needs to send me the blood sugar in 2 weeks to see if they have improve on the new regimen. - I suggested to:  Patient Instructions  Please change: - Tresiba 6 units in am and 6 units at bedtime  Please change: - R insulin: - insulin to carb ratio (ICR)   B'fast: 15:1  Lunch: 20:1   Dinner: 25:1 (no more than 2-3 units) - target 140 - insulin sensitivity factor (ISF) 60: 140-200: + 1 unit 201-260: + 2 units 261-320: + 3 units 321-380: + 4 units >380: + 5 units Do not correct bedtime sugars <300, and only inject 1 unit then.   Please schedule an appt with Leonia Reader for diabetes education.  Please return in 3-4 months with your sugar log.     2. HL -Reviewed her most recent lipid panel and this has worsened: LDL higher.  Triglycerides high, HDL at goal: Lab Results  Component Value Date   CHOL 218 (H) 06/17/2019   HDL 62.70 06/17/2019   LDLCALC 76 09/17/2017    LDLDIRECT 120.0 06/17/2019   TRIG 211.0 (H) 06/17/2019   CHOLHDL 3 06/17/2019  -We tried multiple times to use statins but she developed severe muscle pains every time we tried, despite use of CoQ10, rotation of statins, and different muscle creams.  However, she started pravastatin per PCP and she is now on 40 mg daily.  She still has muscle cramps currently tolerable.  - time spent with the patient: 25 min, of which >50% was spent in obtaining information about her symptoms, reviewing her previous labs, evaluations, and treatments, counseling her about her conditions (please see the discussed topics above), and developing a plan to further investigate and treat them; she had a number of questions which I addressed.   Philemon Kingdom, MD PhD [facility]

## 2019-07-23 ENCOUNTER — Other Ambulatory Visit: Payer: Self-pay | Admitting: Internal Medicine

## 2019-08-04 ENCOUNTER — Other Ambulatory Visit: Payer: Self-pay | Admitting: Internal Medicine

## 2019-08-09 ENCOUNTER — Encounter: Payer: Self-pay | Admitting: Family Medicine

## 2019-08-10 DIAGNOSIS — M65321 Trigger finger, right index finger: Secondary | ICD-10-CM | POA: Diagnosis not present

## 2019-08-10 DIAGNOSIS — M65332 Trigger finger, left middle finger: Secondary | ICD-10-CM | POA: Diagnosis not present

## 2019-08-10 DIAGNOSIS — M65322 Trigger finger, left index finger: Secondary | ICD-10-CM | POA: Diagnosis not present

## 2019-08-31 ENCOUNTER — Ambulatory Visit: Payer: BC Managed Care – PPO | Admitting: Family Medicine

## 2019-08-31 ENCOUNTER — Encounter: Payer: Self-pay | Admitting: Family Medicine

## 2019-08-31 ENCOUNTER — Other Ambulatory Visit: Payer: Self-pay

## 2019-08-31 DIAGNOSIS — H61891 Other specified disorders of right external ear: Secondary | ICD-10-CM

## 2019-08-31 DIAGNOSIS — H6091 Unspecified otitis externa, right ear: Secondary | ICD-10-CM | POA: Insufficient documentation

## 2019-08-31 MED ORDER — NEOMYCIN-POLYMYXIN-HC 3.5-10000-1 OT SOLN: 3.0000 [drp] | Freq: Three times a day (TID) | OTIC | 0 refills | Status: DC

## 2019-08-31 NOTE — Patient Instructions (Signed)
Dry ear wax removed from R ear. Use a few drops of mineral oil a few times a week to help ear canal heal.  Antibiotic drops printed out today - fill if ongoing ear pain.

## 2019-08-31 NOTE — Assessment & Plan Note (Signed)
Cerumen removed with alligator forceps. Tender erythematous patch anterior ear canal - rec treat with mineral oil and if not improving, may fill WASP for cortisporin drops. Pt agrees with plan.

## 2019-08-31 NOTE — Progress Notes (Signed)
This visit was conducted in person.  BP 134/76 (BP Location: Left Arm, Patient Position: Sitting, Cuff Size: Normal)   Pulse 83   Temp 98.2 F (36.8 C) (Temporal)   Ht 5\' 4"  (1.626 m)   Wt 133 lb 9 oz (60.6 kg)   SpO2 99%   BMI 22.93 kg/m    CC: R earache Subjective:    Patient ID: Felicia [name], female    DOB: 06-08-1961, 58 y.o.   MRN: [id number]  HPI: Felicia [name] is a 58 y.o. female presenting on 08/31/2019 for Ear Pain (C/o sharp right ear pain.  Started this morning. Tried Advil Cold/Sinus, not helpful. )   1d h/o sharp R ear pain associated with pressure.  Tried advil cold/sinus without significant benefit.  No significant allergy symptoms.  No fevers/chills, drainage from ear, hearing loss, ST.  No recent swimming.      Relevant past medical, surgical, family and social history reviewed and updated as indicated. Interim medical history since our last visit reviewed. Allergies and medications reviewed and updated. Outpatient Medications Prior to Visit  Medication Sig Dispense Refill  . BD VEO INSULIN SYRINGE U/F 31G X 15/64" 0.3 ML MISC USE TO INJECT INSULIN 6 TIMES PER DAY 200 each 1  . esomeprazole (NEXIUM) 40 MG capsule Take 1 capsule (40 mg total) by mouth daily. Patient needs office visit for further refills 90 capsule 0  . glucagon (GLUCAGON EMERGENCY) 1 MG injection Inject 1 mg into the muscle once as needed (in case of severe hypoglycemia). 1 each 12  . insulin degludec (TRESIBA FLEXTOUCH) 100 UNIT/ML SOPN FlexTouch Pen Inject 0.12 mLs (12 Units total) into the skin daily before breakfast. 15 pen 11  . Insulin Pen Needle (NOVOFINE) 32G X 6 MM MISC USE TO INJECT INSULIN 4 TIMES A DAY 200 each 1  . insulin regular (NOVOLIN R) 100 units/mL injection INJECT 6 UNITS TOTAL INTO THE SKIN 3 (THREE) TIMES DAILY BEFORE MEALS. 60 mL 1  . Multiple Vitamin (MULTIVITAMIN) tablet Take 1 tablet by mouth daily.      . pravastatin (PRAVACHOL) 40 MG tablet Take 1 tablet (40 mg  total) by mouth daily.    . quinapril (ACCUPRIL) 5 MG tablet Take 1 tablet (5 mg total) by mouth daily. 90 tablet 3   Facility-Administered Medications Prior to Visit  Medication Dose Route Frequency Provider Last Rate Last Dose  . 0.9 %  sodium chloride infusion  500 mL Intravenous Continuous Pyrtle, Lajuan Lines, MD         Per HPI unless specifically indicated in ROS section below Review of Systems Objective:    BP 134/76 (BP Location: Left Arm, Patient Position: Sitting, Cuff Size: Normal)   Pulse 83   Temp 98.2 F (36.8 C) (Temporal)   Ht 5\' 4"  (1.626 m)   Wt 133 lb 9 oz (60.6 kg)   SpO2 99%   BMI 22.93 kg/m   Wt Readings from Last 3 Encounters:  08/31/19 133 lb 9 oz (60.6 kg)  06/28/19 129 lb (58.5 kg)  09/04/18 135 lb (61.2 kg)    Physical Exam Vitals signs and nursing note reviewed.  Constitutional:      General: She is not in acute distress.    Appearance: Normal appearance. She is not ill-appearing.  HENT:     Head: Normocephalic and atraumatic.     Right Ear: Hearing, tympanic membrane and external ear normal. Tenderness present.     Left Ear: Hearing, tympanic membrane, ear  canal and external ear normal.     Ears:     Comments: Superficial dry cerumen removed with alligator forceps, pt tolerated well. After removal, area of erythematous anterior canal wall visualized - this is where pain localizes to  Neurological:     Mental Status: She is alert.       Assessment & Plan:   Problem List Items Addressed This Visit    Irritation of external ear canal, right    Cerumen removed with alligator forceps. Tender erythematous patch anterior ear canal - rec treat with mineral oil and if not improving, may fill WASP for cortisporin drops. Pt agrees with plan.           Meds ordered this encounter  Medications  . neomycin-polymyxin-hydrocortisone (CORTISPORIN) OTIC solution    Sig: Place 3 drops into the right ear 3 (three) times daily.    Dispense:  10 mL    Refill:   0   No orders of the defined types were placed in this encounter.   Patient instructions: Dry ear wax removed from R ear. Use a few drops of mineral oil a few times a week to help ear canal heal.  Antibiotic drops printed out today - fill if ongoing ear pain.   Follow up plan: Return if symptoms worsen or fail to improve.  [name], MD

## 2019-09-01 ENCOUNTER — Ambulatory Visit: Payer: BC Managed Care – PPO | Admitting: Gastroenterology

## 2019-09-01 ENCOUNTER — Encounter: Payer: Self-pay | Admitting: Family Medicine

## 2019-09-01 MED ORDER — ACETAMINOPHEN-CODEINE #3 300-30 MG PO TABS: 1.0000 | ORAL_TABLET | Freq: Three times a day (TID) | ORAL | 0 refills | Status: DC | PRN

## 2019-09-21 ENCOUNTER — Ambulatory Visit: Payer: BC Managed Care – PPO | Admitting: Gastroenterology

## 2019-09-22 ENCOUNTER — Other Ambulatory Visit: Payer: Self-pay | Admitting: Family Medicine

## 2019-09-22 DIAGNOSIS — E785 Hyperlipidemia, unspecified: Secondary | ICD-10-CM

## 2019-09-22 DIAGNOSIS — E103299 Type 1 diabetes mellitus with mild nonproliferative diabetic retinopathy without macular edema, unspecified eye: Secondary | ICD-10-CM

## 2019-09-22 DIAGNOSIS — E1069 Type 1 diabetes mellitus with other specified complication: Secondary | ICD-10-CM

## 2019-09-23 ENCOUNTER — Other Ambulatory Visit: Payer: Self-pay

## 2019-09-23 ENCOUNTER — Other Ambulatory Visit (INDEPENDENT_AMBULATORY_CARE_PROVIDER_SITE_OTHER): Payer: BC Managed Care – PPO

## 2019-09-23 DIAGNOSIS — E1069 Type 1 diabetes mellitus with other specified complication: Secondary | ICD-10-CM

## 2019-09-23 DIAGNOSIS — E785 Hyperlipidemia, unspecified: Secondary | ICD-10-CM

## 2019-09-23 DIAGNOSIS — E103299 Type 1 diabetes mellitus with mild nonproliferative diabetic retinopathy without macular edema, unspecified eye: Secondary | ICD-10-CM | POA: Diagnosis not present

## 2019-09-23 LAB — LIPID PANEL
Cholesterol: 196 mg/dL (ref 0–200)
HDL: 58.1 mg/dL (ref >39.00)
LDL Cholesterol: 106 mg/dL — ABNORMAL HIGH (ref 0–99)
NonHDL: 137.9
Total CHOL/HDL Ratio: 3
Triglycerides: 160 mg/dL — ABNORMAL HIGH (ref 0.0–149.0)
VLDL: 32 mg/dL (ref 0.0–40.0)

## 2019-09-23 LAB — HEPATIC FUNCTION PANEL
ALT: 14 U/L (ref 0–35)
AST: 19 U/L (ref 0–37)
Albumin: 4.1 g/dL (ref 3.5–5.2)
Alkaline Phosphatase: 64 U/L (ref 39–117)
Bilirubin, Direct: 0.1 mg/dL (ref 0.0–0.3)
Total Bilirubin: 0.4 mg/dL (ref 0.2–1.2)
Total Protein: 6.9 g/dL (ref 6.0–8.3)

## 2019-09-23 LAB — HEMOGLOBIN A1C: Hgb A1c MFr Bld: 8.5 % — ABNORMAL HIGH (ref 4.6–6.5)

## 2019-10-15 ENCOUNTER — Encounter: Payer: Self-pay | Admitting: Internal Medicine

## 2019-10-18 ENCOUNTER — Encounter: Payer: Self-pay | Admitting: Internal Medicine

## 2019-10-18 ENCOUNTER — Other Ambulatory Visit: Payer: Self-pay

## 2019-10-18 ENCOUNTER — Ambulatory Visit (INDEPENDENT_AMBULATORY_CARE_PROVIDER_SITE_OTHER): Payer: BC Managed Care – PPO | Admitting: Internal Medicine

## 2019-10-18 DIAGNOSIS — E103299 Type 1 diabetes mellitus with mild nonproliferative diabetic retinopathy without macular edema, unspecified eye: Secondary | ICD-10-CM

## 2019-10-18 DIAGNOSIS — E785 Hyperlipidemia, unspecified: Secondary | ICD-10-CM | POA: Diagnosis not present

## 2019-10-18 NOTE — Progress Notes (Signed)
Patient ID: Felicia [name], female   DOB: 1961-01-27, 58 y.o.   MRN: [id number]  Patient location: work My location: Office Persons participating in the virtual visit: patient, provider  Referring Provider: Ria Bush, MD  I connected with the patient on 10/18/19 at  8:02 AM EST by a video enabled telemedicine application and verified that I am speaking with the correct person.   I discussed the limitations of evaluation and management by telemedicine and the availability of in person appointments. The patient expressed understanding and agreed to proceed.   Details of the encounter are shown below. HPI: Felicia [name] is a 58 y.o.-year-old female, presenting for follow-up for DM1, dx in 14 (age 52), uncontrolled, with complications (mild retinopathy, gastroparesis). Last visit 4 months ago (virtual).  Since last OV, cut down starches.  Last hemoglobin A1c was higher: Lab Results  Component Value Date   HGBA1C 8.5 (H) 09/23/2019   HGBA1C 9.0 (H) 06/17/2019   HGBA1C 8.0 (A) 09/04/2018   She was previously on an insulin pump for 5 years (approximately 2010) but she did not like it.  Sugars were not improved on this.  She is not interested in getting back on the pump but at last visit she was open to a CGM.  I referred her to diabetes education but she did not have the appointment yet. However, she called her insurance >> Dexcom not affordable.  She is on: - Tresiba 12 units in am >> 6-8 units in am >> 10 units in a.m. >> 10 in am and 2 at night >> 6 units 2x a day -abdomen - R insulin: - arms and legs now - insulin to carb ratio (ICR)   B'fast: 15:1 >> 10:1  Lunch: 20:1 >> 10:1  Dinner: 20:1 >> 25:1 >> 20:1 - target 150 - insulin sensitivity factor (ISF) 60: 150-200: + 1 unit 201-260: + 2 units 261-320: + 3 units 321-380: + 4 units >380: + 5 units Do not correct bedtime sugars <300, and only inject 1 unit then.   We tried Metformin ER 1000 mg with dinner but  stopped due to lack of effect Tried Toujeo >> allergy: CP, SOB.  Meter: ReliOn  Pt checks her sugars 4-5 times a day per review of her log:  Previously - am:   52, 59, 83-224, 342 >> 50, 66, 78-295, 339 >> 129, 166-291/143-290, 300 - 2h after b'fast:  116-186, 471 >> 247 >> 68, 72 >> n/c - before lunch:  93-315, 422 >> 129--243, 320 >> 71, 87-222, 316, 397/76, 146-202, 256, 372 - 2h after lunch:  226 >> n/c >> 87 >> 134 >> 209 >> 70 - before dinner: 71-235, 363, 396 >> 113-284, 308 >> 83, 109- 273, 360, 401/101-265, 328 - 2h after dinner:   n/c >> 183 >> n/c >> 80-92 >> 83, 89, 96 >> n/c - bedtime:  45, 67-138, 192, 340 >> 61-216, 320, 343 >> 66-183, 276/ 53, 74-165 - nighttime: 147 >> 46, 277 >> 56 >> 44 (overcorrection), 68, 94 >> 70- 274 >> 44, 50, 311 Lowest sugar was 38 >>... 44 >> 44; she has hypoglycemia awareness in the  70s.  No recent hypo-or hyperglycemia admissions.  She has a non-expired glucagon kit at home. Highest sugar was 502 (dried fruit) .Marland Kitchen.343 >> 401 >> 500s (bagel).   Pt's meals are: - Breakfast: protein bar + fruit >> granola bar (25 g carbs) - 10 am snack  - Lunch: apple + cheese, carrots -  Snack bar: 15g carbs - Glucerna - Dinner: meat + 1/2 backed potato + veggies/salad - Snacks: popcorn, salty snacks: Potato chips  -No CAD, last BUN/creatinine:  Lab Results  Component Value Date   BUN 15 06/17/2019   CREATININE 0.92 06/17/2019  On quinapril 5. -+ HL last set of lipids: Lab Results  Component Value Date   CHOL 196 09/23/2019   HDL 58.10 09/23/2019   LDLCALC 106 (H) 09/23/2019   LDLDIRECT 120.0 06/17/2019   TRIG 160.0 (H) 09/23/2019   CHOLHDL 3 09/23/2019   In 04/2016, we started pravastatin, but she still developed muscle cramps.  Co-Q10 did not help much.  These were initially controlled with 2 creams: Theraworx + Muscle calm, but then she had to stop the statin because of increased muscle cramps.  However, she is now on pravastatin 40 mg daily and  she still has muscle cramps but these are tolerable. - last eye exam was in 2019: Mild DR-improved reportedly -She denies numbness and tingling in her feet.  Latest TSH was repeated and this was normal: Lab Results  Component Value Date   TSH 2.21 09/17/2017   She also has a history of HTN.  ROS: Constitutional: no weight gain/no weight loss, no fatigue, no subjective hyperthermia, no subjective hypothermia Eyes: no blurry vision, no xerophthalmia ENT: no sore throat, no nodules palpated in neck, no dysphagia, no odynophagia, no hoarseness Cardiovascular: no CP/no SOB/no palpitations/no leg swelling Respiratory: no cough/no SOB/no wheezing Gastrointestinal: no N/no V/no D/no C/no acid reflux Musculoskeletal: no muscle aches/no joint aches Skin: no rashes, no hair loss Neurological: no tremors/no numbness/no tingling/no dizziness  I reviewed pt's medications, allergies, PMH, social hx, family hx, and changes were documented in the history of present illness. Otherwise, unchanged from my initial visit note.  Past Medical History:  Diagnosis Date  . Allergy   . Anemia    past history long ago  . Disorder of kidney    has abd kidney (right)  . GERD (gastroesophageal reflux disease)   . History of diabetic gastroparesis   . HLD (hyperlipidemia)    borderline  . HTN (hypertension)    borderline  . Hx: UTI (urinary tract infection)   . Hypertension   . IBS (irritable bowel syndrome)   . Nephrolithiasis 2005  . Osteopenia 2012   per pt  . Systolic murmur 01/5669   mild mitral insuff  . Type 1 diabetes mellitus with diabetic retinopathy without macular edema (HCC)    dx at 58 y/o, background retinopathy   Past Surgical History:  Procedure Laterality Date  . CESAREAN SECTION  1985  . COLONOSCOPY  07/2016   WNL (Pyrtle)  . ESOPHAGOGASTRODUODENOSCOPY  07/2016   LA Grade C reflux esophagitis, concern for stasis/dysmotility (Pyrtle)  . LAPAROSCOPIC APPENDECTOMY N/A 09/01/2015    Procedure: APPENDECTOMY LAPAROSCOPIC;  Surgeon: Judeth Horn, MD;  Location: [facility];  Service: General;  Laterality: N/A;  . ORIF FEMUR FRACTURE Left 1996   Corrected by patient in history 2016  . PARTIAL HYSTERECTOMY  2000   paps by [facility] [place]  . TONSILLECTOMY AND ADENOIDECTOMY  1975  . TRIGGER FINGER RELEASE Left 12/2010   thumb   Social History   Socioeconomic History  . Marital status: Married    Spouse name: Not on file  . Number of children: 1  . Years of education: 25  . Highest education level: Bachelor's degree (e.g., BA, AB, BS)  Occupational History  . Occupation: HR Mudlogger  Employer: OTHER    Comment: [employer]  Tobacco Use  . Smoking status: Never Smoker  . Smokeless tobacco: Never Used  Substance and Sexual Activity  . Alcohol use: No    Alcohol/week: 0.0 standard drinks  . Drug use: No  . Sexual activity: Not on file  Other Topics Concern  . 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  Has one daughter.  Education: college.      1 dog   Social Determinants of Health   Financial Resource Strain:   . Difficulty of Paying Living Expenses: Not on file  Food Insecurity:   . Worried About Charity fundraiser in the Last Year: Not on file  . Ran Out of Food in the Last Year: Not on file  Transportation Needs:   . Lack of Transportation (Medical): Not on file  . Lack of Transportation (Non-Medical): Not on file  Physical Activity:   . Days of Exercise per Week: Not on file  . Minutes of Exercise per Session: Not on file  Stress:   . Feeling of Stress : Not on file  Social Connections:   . Frequency of Communication with Friends and Family: Not on file  . Frequency of Social Gatherings with Friends and Family: Not on file  . Attends Religious Services: Not on file  . Active Member of Clubs or Organizations: Not on file  . Attends Theatre manager Meetings: Not on file  . Marital Status: Not on file  Intimate Partner Violence:   . Fear of Current or Ex-Partner: Not on file  . Emotionally Abused: Not on file  . Physically Abused: Not on file  . Sexually Abused: Not on file   Current Outpatient Medications on File Prior to Visit  Medication Sig Dispense Refill  . acetaminophen-codeine (TYLENOL #3) 300-30 MG tablet Take 1 tablet by mouth 3 (three) times daily as needed for moderate pain. 15 tablet 0  . BD VEO INSULIN SYRINGE U/F 31G X 15/64" 0.3 ML MISC USE TO INJECT INSULIN 6 TIMES PER DAY 200 each 1  . esomeprazole (NEXIUM) 40 MG capsule Take 1 capsule (40 mg total) by mouth daily. Patient needs office visit for further refills 90 capsule 0  . glucagon (GLUCAGON EMERGENCY) 1 MG injection Inject 1 mg into the muscle once as needed (in case of severe hypoglycemia). 1 each 12  . insulin degludec (TRESIBA FLEXTOUCH) 100 UNIT/ML SOPN FlexTouch Pen Inject 0.12 mLs (12 Units total) into the skin daily before breakfast. 15 pen 11  . Insulin Pen Needle (NOVOFINE) 32G X 6 MM MISC USE TO INJECT INSULIN 4 TIMES A DAY 200 each 1  . insulin regular (NOVOLIN R) 100 units/mL injection INJECT 6 UNITS TOTAL INTO THE SKIN 3 (THREE) TIMES DAILY BEFORE MEALS. 60 mL 1  . Multiple Vitamin (MULTIVITAMIN) tablet Take 1 tablet by mouth daily.      Marland Kitchen neomycin-polymyxin-hydrocortisone (CORTISPORIN) OTIC solution Place 3 drops into the right ear 3 (three) times daily. 10 mL 0  . pravastatin (PRAVACHOL) 40 MG tablet Take 1 tablet (40 mg total) by mouth daily.    . quinapril (ACCUPRIL) 5 MG tablet Take 1 tablet (5 mg total) by mouth daily. 90 tablet 3   Current Facility-Administered Medications on File Prior to Visit  Medication Dose Route Frequency Provider Last Rate Last Admin  . 0.9 %  sodium chloride infusion  500 mL Intravenous Continuous Pyrtle, Lajuan Lines, MD       Allergies  Allergen Reactions  . Protonix [Pantoprazole] Swelling    Facial  Swelling  . Toujeo Solostar [Insulin Glargine] Shortness Of Breath  . Lactose Intolerance (Gi)   . Magnesium-Containing Compounds Swelling   Family History  Problem Relation Age of Onset  . Diabetes Maternal Grandmother   . Heart failure Maternal Grandmother   . COPD Maternal Grandmother        smoker  . Thyroid disease Maternal Grandmother   . Vascular Disease Maternal Grandmother   . Colon cancer Maternal Grandfather   . Colon polyps Mother   . Breast cancer Other   . Esophageal cancer Neg Hx   . Rectal cancer Neg Hx   . Stomach cancer Neg Hx    PE: There were no vitals taken for this visit. There is no height or weight on file to calculate BMI.  Wt Readings from Last 3 Encounters:  08/31/19 133 lb 9 oz (60.6 kg)  06/28/19 129 lb (58.5 kg)  09/04/18 135 lb (61.2 kg)   Constitutional:  in NAD  The physical exam was not performed (virtual visit).  ASSESSMENT: 1. DM1, uncontrolled, with complications - Mild DR - Gastroparesis - This is the reason why she is on regular insulin, and she is taking it 10 minutes  rather than 30 minutes before eating  She refused to try a freestyle libre CGM.  She had low CBGs with Novolog!  2. HL  PLAN:  1. Patient with longstanding, uncontrolled, type 1 diabetes, on basal/bolus insulin regimen.  She tried an insulin pump before but did not like it.  At last visit, we again discussed about this possibility, since we reached the limit of what we can do with her basal-bolus insulin regimen.  She finally agreed to a CGM and maybe even an insulin pump.  I referred her to diabetes education but she did not have this appointment yet.  She tells me that the Dexcom CGM was is very expensive and she could not afford it.  We discussed about other options: Freestyle libre 2 (with alarms) and Eversense (she would not want an implantable device).  She will call her insurance and let me know if freestyle grade 2 is covered. -Another option with up into at a  GLP-1 receptor agonist but she has gastroparesis I did not suggest this. -At last visit sugars were very fluctuating with both hypo and hyperglycemia episodes without apparent culprit.  We made small changes in her insulin regimen, however, since last visit, she readjusted her regular insulin doses.  As of now, she tells me that she also changed her injection sites: Tyler Aas is injected in the abdomen but she moved to regular insulin and upper arms and thighs.  She is concerned about scar tissue. -At this visit, we reviewed together her blood sugars at home and they remain mostly at goal or slightly low at bedtime, fluctuating during the day and high in the morning.  However, this is not a consistent pattern, she occasionally has high blood sugars at bedtime and low-dose in a.m. (lowest 51).  Because of the lower blood sugars at bedtime, I advised her in the past not to take more than 2 or 3 units of insulin with dinner.  Also, to avoid low blood sugars during the night, I advised her to split the Tresiba dose, which is an unusual practice due to the long half-time of  this insulin, however, she is doing slightly better on a twice daily regimen. -Some of the lows that she may be having are due to correcting a high blood sugar, so we discussed about correction practices, for both high and low CBGs.  She is not correcting high blood sugars at bedtime with her sliding scale and we again discussed not to use more than 1 unit if sugars are higher than 300 and not to correct sugars lower than 300 at bedtime.  Also, she does not count the number of carbs that she takes to correct the low blood sugars and I advised her to use 50 g (half a cup or half a can of soda) -She tells me that she has a high-protein dinner and I believe that this could be the reason for her high blood sugars in the morning.  We discussed about changing her meet to a starch and continuing to eat vegetables.  This way, her sugars at that time will be  higher, eliminating the need for a snack, which she is getting now.  Also, a lower protein dinner will allow her sugars in the morning to improve. -At this visit, we discussed that if she does not obtain the CGM, her to wake up at night 3 nights in a row and see if her sugars are low.  However, I would do this only after changing dinner to see if her sugars in the morning improved. - I suggested to:  Patient Instructions  Please continue: - Tresiba 6 units in am and 6 units at bedtime - R insulin: - insulin to carb ratio (ICR)   B'fast: 10:1  Lunch: 10:1   Dinner: 20:1 (no more than 2-3 units) - target 150 - insulin sensitivity factor (ISF) 60: 150-200: + 1 unit 201-260: + 2 units 261-320: + 3 units 321-380: + 4 units >380: + 5 units Do not correct bedtime sugars <300, and only inject 1 unit then.   Please schedule an appt with Leonia Reader for diabetes education.  Please return in 3-4 months with your sugar log.     2. HL - Reviewed latest lipid panel from 09/2018: LDL improved, now only slightly above target.  Triglycerides slightly high.  HDL at goal. Lab Results  Component Value Date   CHOL 196 09/23/2019   HDL 58.10 09/23/2019   LDLCALC 106 (H) 09/23/2019   LDLDIRECT 120.0 06/17/2019   TRIG 160.0 (H) 09/23/2019   CHOLHDL 3 09/23/2019  -On pravastatin 40 - she has tolerable muscle cramps.  - time spent with the patient: 25 min, of which >50% was spent in reviewing her blood sugars and insulin doses, discussing her hypo- and hyper-glycemic episodes, reviewing previous labs and pump settings and developing a plan to avoid hypo- and hyper-glycemia.    Philemon Kingdom, MD PhD [facility]

## 2019-10-18 NOTE — Patient Instructions (Addendum)
Please continue: - Tresiba 6 units in am and 6 units at bedtime - R insulin: - insulin to carb ratio (ICR)   B'fast: 10:1  Lunch: 10:1   Dinner: 20:1 (no more than 2-3 units) - target 150 - insulin sensitivity factor (ISF) 60: 150-200: + 1 unit 201-260: + 2 units 261-320: + 3 units 321-380: + 4 units >380: + 5 units Do not correct bedtime sugars <300, and only inject 1 unit then.   Please check sugars at night 3 nights in a row.  Try to change dinner >> instead of meat, try starches.   Call your insurance about the [facility] 2 CGM.  Please return in 3-4 months with your sugar log.

## 2019-10-25 ENCOUNTER — Encounter: Payer: Self-pay | Admitting: Internal Medicine

## 2019-10-25 MED ORDER — LANTUS SOLOSTAR 100 UNIT/ML ~~LOC~~ SOPN: 12.0000 [IU] | PEN_INJECTOR | Freq: Every day | SUBCUTANEOUS | 11 refills | Status: DC

## 2019-11-08 ENCOUNTER — Encounter: Payer: Self-pay | Admitting: Internal Medicine

## 2019-11-18 DIAGNOSIS — E103293 Type 1 diabetes mellitus with mild nonproliferative diabetic retinopathy without macular edema, bilateral: Secondary | ICD-10-CM | POA: Diagnosis not present

## 2019-11-18 LAB — HM DIABETES EYE EXAM

## 2019-12-01 ENCOUNTER — Encounter: Payer: Self-pay | Admitting: Family Medicine

## 2019-12-03 ENCOUNTER — Encounter: Payer: Self-pay | Admitting: Family Medicine

## 2019-12-08 NOTE — Addendum Note (Signed)
Addended by: [name] on: 12/08/2019 03:10 PM   Modules accepted: Orders

## 2019-12-21 ENCOUNTER — Telehealth: Payer: Self-pay | Admitting: Internal Medicine

## 2019-12-21 MED ORDER — "BD VEO INSULIN SYRINGE U/F 31G X 15/64"" 0.3 ML MISC": 11 refills | Status: DC

## 2019-12-21 NOTE — Telephone Encounter (Signed)
RX sent

## 2019-12-21 NOTE — Telephone Encounter (Signed)
MEDICATION: BD VEO Insulin Syringe  PHARMACY:  [facility] [address] [place]  IS THIS A 90 DAY SUPPLY : YES  IS PATIENT OUT OF MEDICATION: NO  IF NOT; HOW MUCH IS LEFT: 10 Needles  LAST APPOINTMENT DATE: @12 /14/2020  NEXT APPOINTMENT DATE:@4 /29/2021  DO WE HAVE YOUR PERMISSION TO LEAVE A DETAILED MESSAGE: yes  OTHER COMMENTS:    **Let patient know to contact pharmacy at the end of the day to make sure medication is ready. **  ** Please notify patient to allow 48-72 hours to process**  **Encourage patient to contact the pharmacy for refills or they can request refills through [facility]**

## 2019-12-22 ENCOUNTER — Other Ambulatory Visit: Payer: Self-pay

## 2019-12-22 MED ORDER — "BD VEO INSULIN SYRINGE U/F 31G X 15/64"" 0.3 ML MISC": 11 refills | Status: DC

## 2019-12-27 ENCOUNTER — Encounter: Payer: Self-pay | Admitting: Family Medicine

## 2019-12-27 DIAGNOSIS — E1069 Type 1 diabetes mellitus with other specified complication: Secondary | ICD-10-CM

## 2019-12-27 DIAGNOSIS — M79604 Pain in right leg: Secondary | ICD-10-CM

## 2019-12-27 DIAGNOSIS — M79605 Pain in left leg: Secondary | ICD-10-CM

## 2019-12-27 DIAGNOSIS — E103293 Type 1 diabetes mellitus with mild nonproliferative diabetic retinopathy without macular edema, bilateral: Secondary | ICD-10-CM

## 2019-12-27 DIAGNOSIS — E785 Hyperlipidemia, unspecified: Secondary | ICD-10-CM

## 2020-01-07 ENCOUNTER — Telehealth: Payer: Self-pay | Admitting: Internal Medicine

## 2020-01-07 MED ORDER — "INSULIN SYRINGE-NEEDLE U-100 30G X 5/16"" 0.3 ML MISC": 11 refills | Status: DC

## 2020-01-07 NOTE — Telephone Encounter (Signed)
Patient requests the RX for Insulin Syringe-Needle U-100 (BD VEO INSULIN SYRINGE U/F) 31G X 15/64" 0.3 ML MISC be changed to U-30 (smaller syringe) be sent to:  [id number] [place], [place] - [address] Phone:  [phone]  Fax:  [phone]

## 2020-01-07 NOTE — Telephone Encounter (Signed)
RX sent

## 2020-01-13 ENCOUNTER — Other Ambulatory Visit: Payer: Self-pay

## 2020-01-13 ENCOUNTER — Encounter: Payer: Self-pay | Admitting: Internal Medicine

## 2020-01-13 ENCOUNTER — Encounter: Payer: Self-pay | Admitting: Family Medicine

## 2020-01-13 ENCOUNTER — Ambulatory Visit (INDEPENDENT_AMBULATORY_CARE_PROVIDER_SITE_OTHER): Payer: BC Managed Care – PPO | Admitting: Internal Medicine

## 2020-01-13 ENCOUNTER — Ambulatory Visit: Payer: BC Managed Care – PPO | Admitting: Family Medicine

## 2020-01-13 VITALS — BP 136/70 | HR 83 | Temp 97.6°F | Ht 64.0 in | Wt 137.3 lb

## 2020-01-13 DIAGNOSIS — E1069 Type 1 diabetes mellitus with other specified complication: Secondary | ICD-10-CM

## 2020-01-13 DIAGNOSIS — E103293 Type 1 diabetes mellitus with mild nonproliferative diabetic retinopathy without macular edema, bilateral: Secondary | ICD-10-CM

## 2020-01-13 DIAGNOSIS — E785 Hyperlipidemia, unspecified: Secondary | ICD-10-CM | POA: Diagnosis not present

## 2020-01-13 DIAGNOSIS — H60331 Swimmer's ear, right ear: Secondary | ICD-10-CM | POA: Diagnosis not present

## 2020-01-13 MED ORDER — CORTISPORIN-TC 3.3-3-10-0.5 MG/ML OT SUSP: 4.0000 [drp] | Freq: Four times a day (QID) | OTIC | 0 refills | Status: DC

## 2020-01-13 NOTE — Patient Instructions (Signed)
You have recurrent external ear infection  Treat with cortisporin drops, ibuprofen. If ongoing trouble, let me know for referral to ENT.   Otitis Externa  Otitis externa is an infection of the outer ear canal. The outer ear canal is the area between the outside of the ear and the eardrum. Otitis externa is sometimes called swimmer's ear. What are the causes? Common causes of this condition include:  Swimming in dirty water.  Moisture in the ear.  An injury to the inside of the ear.  An object stuck in the ear.  A cut or scrape on the outside of the ear. What increases the risk? You are more likely to develop this condition if you go swimming often. What are the signs or symptoms? The first symptom of this condition is often itching in the ear. Later symptoms of the condition include:  Swelling of the ear.  Redness in the ear.  Ear pain. The pain may get worse when you pull on your ear.  Pus coming from the ear. How is this diagnosed? This condition may be diagnosed by examining the ear and testing fluid from the ear for bacteria and funguses. How is this treated? This condition may be treated with:  Antibiotic ear drops. These are often given for 10-14 days.  Medicines to reduce itching and swelling. Follow these instructions at home:  If you were prescribed antibiotic ear drops, use them as told by your health care provider. Do not stop using the antibiotic even if your condition improves.  Take over-the-counter and prescription medicines only as told by your health care provider.  Avoid getting water in your ears as told by your health care provider. This may include avoiding swimming or water sports for a few days.  Keep all follow-up visits as told by your health care provider. This is important. How is this prevented?  Keep your ears dry. Use the corner of a towel to dry your ears after you swim or bathe.  Avoid scratching or putting things in your ear. Doing  these things can damage the ear canal or remove the protective wax that lines it, which makes it easier for bacteria and funguses to grow.  Avoid swimming in lakes, polluted water, or pools that may not have enough chlorine. Contact a health care provider if:  You have a fever.  Your ear is still red, swollen, painful, or draining pus after 3 days.  Your redness, swelling, or pain gets worse.  You have a severe headache.  You have redness, swelling, pain, or tenderness in the area behind your ear. Summary  Otitis externa is an infection of the outer ear canal.  Common causes include swimming in dirty water, moisture in the ear, or a cut or scrape in the ear.  Symptoms include pain, redness, and swelling of the ear.  If you were prescribed antibiotic ear drops, use them as told by your health care provider. Do not stop using the antibiotic even if your condition improves. This information is not intended to replace advice given to you by your health care provider. Make sure you discuss any questions you have with your health care provider. Document Revised: 03/27/2018 Document Reviewed: 03/27/2018 Elsevier Patient Education  2020 [employer].

## 2020-01-13 NOTE — Progress Notes (Signed)
This visit was conducted in person.  BP 136/70 (BP Location: Right Arm, Patient Position: Sitting, Cuff Size: Normal)   Pulse 83   Temp 97.6 F (36.4 C) (Temporal)   Ht 5\' 4"  (1.626 m)   Wt 137 lb 5 oz (62.3 kg)   SpO2 99%   BMI 23.57 kg/m    CC: R ear pain Subjective:    Patient ID: , female    DOB: [birth year], 59 y.o.   MRN: [id number]  HPI: Felicia [name] is a 59 y.o. female presenting on 01/13/2020 for Ear Pain (C/o right ear pain.  Feels like stabbing pain.  Started 2 days ago.  H/o same sxs in past and treated neomycin-polymyxin, which helped. )   2d h/o R ear pain describes sharp stabbing pain to ear associated with some frontal sinus pressure.  No significant nasal congestion, ST, tooth pain.   Similar episode 08/2019 with exam showing erythematous anterior ear canal where dry cerumen had accumulated, treated with mineral oil and cortisporin drops with benefit.   She started using left over cortisporin + ibuprofen with benefit.   T1DM - noted worsening sugar control. Planning to see endo soon for f/u.      Relevant past medical, surgical, family and social history reviewed and updated as indicated. Interim medical history since our last visit reviewed. Allergies and medications reviewed and updated. Outpatient Medications Prior to Visit  Medication Sig Dispense Refill  . esomeprazole (NEXIUM) 40 MG capsule Take 1 capsule (40 mg total) by mouth daily. Patient needs office visit for further refills 90 capsule 0  . glucagon (GLUCAGON EMERGENCY) 1 MG injection Inject 1 mg into the muscle once as needed (in case of severe hypoglycemia). 1 each 12  . Insulin Glargine (LANTUS SOLOSTAR) 100 UNIT/ML Solostar Pen Inject 12 Units into the skin daily. 5 pen 11  . Insulin Pen Needle (NOVOFINE) 32G X 6 MM MISC USE TO INJECT INSULIN 4 TIMES A DAY 200 each 1  . insulin regular (NOVOLIN R) 100 units/mL injection INJECT 6 UNITS TOTAL INTO THE SKIN 3 (THREE) TIMES DAILY  BEFORE MEALS. 60 mL 1  . Insulin Syringe-Needle U-100 30G X 5/16" 0.3 ML MISC Use to inject insulin 6 times a day. 600 each 11  . Multiple Vitamin (MULTIVITAMIN) tablet Take 1 tablet by mouth daily.      . pravastatin (PRAVACHOL) 40 MG tablet Take 1 tablet (40 mg total) by mouth every other day.    . quinapril (ACCUPRIL) 5 MG tablet Take 1 tablet (5 mg total) by mouth daily. 90 tablet 3   Facility-Administered Medications Prior to Visit  Medication Dose Route Frequency Provider Last Rate Last Admin  . 0.9 %  sodium chloride infusion  500 mL Intravenous Continuous Pyrtle, 6/16, MD         Per HPI unless specifically indicated in ROS section below Review of Systems Objective:    BP 136/70 (BP Location: Right Arm, Patient Position: Sitting, Cuff Size: Normal)   Pulse 83   Temp 97.6 F (36.4 C) (Temporal)   Ht 5\' 4"  (1.626 m)   Wt 137 lb 5 oz (62.3 kg)   SpO2 99%   BMI 23.57 kg/m   Wt Readings from Last 3 Encounters:  01/13/20 137 lb 5 oz (62.3 kg)  08/31/19 133 lb 9 oz (60.6 kg)  06/28/19 129 lb (58.5 kg)    Physical Exam Vitals and nursing note reviewed.  Constitutional:      Appearance:  Normal appearance. She is not ill-appearing.  HENT:     Head: Normocephalic and atraumatic.     Right Ear: Tympanic membrane and external ear normal. Drainage and tenderness present. There is no impacted cerumen.     Left Ear: Tympanic membrane, ear canal and external ear normal. No tenderness. There is no impacted cerumen.     Ears:     Comments: Macerated tender R anterior ear canal with buildup of dry wax which was fully removed with alligator forceps Lymphadenopathy:     Head:     Right side of head: No preauricular or posterior auricular adenopathy.     Left side of head: No preauricular or posterior auricular adenopathy.  Neurological:     Mental Status: She is alert.       Results for orders placed or performed in visit on 12/03/19  HM DIABETES EYE EXAM  Result Value Ref Range     HM Diabetic Eye Exam Retinopathy (A) No Retinopathy   Assessment & Plan:  This visit occurred during the SARS-CoV-2 public health emergency.  Safety protocols were in place, including screening questions prior to the visit, additional usage of staff PPE, and extensive cleaning of exam room while observing appropriate contact time as indicated for disinfecting solutions.   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       External otitis of right ear - Primary    Anticipate external bacterial otitis given pain and abnormal exam. Dry cerumen removed from ear canal, will refill cortisporin drops which were previously effective. Doubt fungal external otitis as no itching, more pain. If recurs, will refer to ENT. Pt agrees with plan.           Meds ordered this encounter  Medications  . neomycin-colistin-hydrocortisone-thonzonium (CORTISPORIN-TC) 3.01-04-09-0.5 MG/ML OTIC suspension    Sig: Place 4 drops into the right ear 4 (four) times daily.    Dispense:  10 mL    Refill:  0   No orders of the defined types were placed in this encounter.   Patient instructions: You have recurrent external ear infection  Treat with cortisporin drops, ibuprofen. If ongoing trouble, let me know for referral to ENT.   Follow up plan: Return if symptoms worsen or fail to improve.  Ria Bush, MD

## 2020-01-13 NOTE — Progress Notes (Signed)
Patient ID: Felicia [name], female   DOB: 1961/09/25, 59 y.o.   MRN: [id number]  Patient location: Home My location: Office Persons participating in the virtual visit: patient, provider  Referring Provider: Ria Bush, MD  I connected with the patient on 01/13/20 at  4:06 PM EST by a video enabled telemedicine application and verified that I am speaking with the correct person.   I discussed the limitations of evaluation and management by telemedicine and the availability of in person appointments. The patient expressed understanding and agreed to proceed.   Details of the encounter are shown below.  HPI: Felicia [name] is a 59 y.o.-year-old female, presenting for follow-up for DM1, dx in 55 (age 49), uncontrolled, with complications (mild retinopathy, gastroparesis). Last visit 3 months ago (virtual).  Reviewed HbA1c levels: Lab Results  Component Value Date   HGBA1C 8.5 (H) 09/23/2019   HGBA1C 9.0 (H) 06/17/2019   HGBA1C 8.0 (A) 09/04/2018   She was previously on an insulin pump for 5 years (approximately 2010) but she did not like it.  Sugars were not improved on this.  She is not interested in getting back on the pump but at last visit she was open to a CGM.  I referred her to diabetes education but she did not have the appointment yet.  She did check with her insurance >> Dexcom was not affordable.  She is on: - Tresiba 12 units in am >> 6-8 units in am >> 10 units in a.m. >> 10 in am and 2 at night >> 6 units 2x a day -abdomen >> now back on Lantus 8 units in am and 4 units at bedtime - R insulin: -Arms and legs - insulin to carb ratio (ICR)   B'fast: 15:1 >> 10:1  Lunch: 20:1 >> 10:1  Dinner: 20:1 >> 25:1 >> 20:1 (no more than 2-3 units) - target; 150 - insulin sensitivity factor (ISF) 60 >> 40: 150-190: +1 unit 191-230: +2 units 231-270: +3 units 271-310: +4 units 311-350: +5 units >350: +6 units Do not correct bedtime sugars <300, and only inject 1 unit  then.   We tried Metformin ER 1000 mg with dinner but stopped due to lack of effect. Tried [place] >> she did not feel that this worked as well as Lantus. Tried Toujeo >> allergy: CP, SOB.  Meter: ReliOn  She checks her sugars 4-5 times a day per review of her log: - am: 175-343 - 2h after b'fast: n/c - lunch: 146, 236-342 - 2h after lunch: n/c - dinner: 196-303, 436  - 2h after dinner: n/c - bedtime: 83-203 - night: 55, 150  Prev:   Prev: - am:   52, 59, 83-224, 342 >> 50, 66, 78-295, 339 >> 129, 166-291/143-290, 300 - 2h after b'fast:  116-186, 471 >> 247 >> 68, 72 >> n/c - before lunch:  93-315, 422 >> 129--243, 320 >> 71, 87-222, 316, 397/76, 146-202, 256, 372 - 2h after lunch:  226 >> n/c >> 87 >> 134 >> 209 >> 70 - before dinner: 71-235, 363, 396 >> 113-284, 308 >> 83, 109- 273, 360, 401/101-265, 328 - 2h after dinner:   n/c >> 183 >> n/c >> 80-92 >> 83, 89, 96 >> n/c - bedtime:  45, 67-138, 192, 340 >> 61-216, 320, 343 >> 66-183, 276/ 53, 74-165 - nighttime: 147 >> 46, 277 >> 56 >> 44 (overcorrection), 68, 94 >> 70- 274 >> 44, 50, 311 Lowest sugar was 38 >>... 44 >> 55; she  has hypoglycemia awareness in the 70s.  No recent hypo-or hyperglycemia admissions.  She has an unexpired glucagon kit at home. Highest sugar was 500s (bagel) >> 436.   Pt's meals are: - Breakfast: protein bar + fruit >> granola bar (25 g carbs) - 10 am snack  - Lunch: apple + cheese, carrots - Snack bar: 15g carbs - Glucerna - Dinner: meat + 1/2 backed potato + veggies/salad - Snacks: popcorn, salty snacks: Potato chips  -No CKD, last BUN/creatinine:  Lab Results  Component Value Date   BUN 15 06/17/2019   CREATININE 0.92 06/17/2019  On quinapquinapril L last set of lipids: Lab Results  Component Value Date   CHOL 196 09/23/2019   HDL 58.10 09/23/2019   LDLCALC 106 (H) 09/23/2019   LDLDIRECT 120.0 06/17/2019   TRIG 160.0 (H) 09/23/2019   CHOLHDL 3 09/23/2019  In 04/2016, we started  pravastatin, but she had to come off due to muscle cramps.  CoQ10 did not help much. She is back on pravastatin and her dose is changed by PCP due to persistent muscle cramps. She uses 2 creams: Theraworx + Muscle calm.   - last eye exam was in 2019: Mild DR-reportedly improved -no numbness and tingling in her feet.  TSH was normal at last check: Lab Results  Component Value Date   TSH 2.21 09/17/2017   She also has a history of HTN.  ROS: Constitutional: no weight gain/no weight loss, no fatigue, no subjective hyperthermia, no subjective hypothermia Eyes: no blurry vision, no xerophthalmia ENT: no sore throat, no nodules palpated in neck, no dysphagia, no odynophagia, no hoarseness Cardiovascular: no CP/no SOB/no palpitations/no leg swelling Respiratory: no cough/no SOB/no wheezing Gastrointestinal: no N/no V/no D/no C/no acid reflux Musculoskeletal: + muscle aches/no joint aches Skin: no rashes, no hair loss Neurological: no tremors/no numbness/no tingling/no dizziness  I reviewed pt's medications, allergies, PMH, social hx, family hx, and changes were documented in the history of present illness. Otherwise, unchanged from my initial visit note.  Past Medical History:  Diagnosis Date  . Allergy   . Anemia    past history long ago  . Disorder of kidney    has abd kidney (right)  . GERD (gastroesophageal reflux disease)   . History of diabetic gastroparesis   . HLD (hyperlipidemia)    borderline  . HTN (hypertension)    borderline  . Hx: UTI (urinary tract infection)   . Hypertension   . IBS (irritable bowel syndrome)   . Nephrolithiasis 2005  . Osteopenia 2012   per pt  . Systolic murmur 07/1477   mild mitral insuff  . Type 1 diabetes mellitus with diabetic retinopathy without macular edema (HCC)    dx at 59 y/o, background retinopathy   Past Surgical History:  Procedure Laterality Date  . CESAREAN SECTION  1985  . COLONOSCOPY  07/2016   WNL (Pyrtle)  .  ESOPHAGOGASTRODUODENOSCOPY  07/2016   LA Grade C reflux esophagitis, concern for stasis/dysmotility (Pyrtle)  . LAPAROSCOPIC APPENDECTOMY N/A 09/01/2015   Procedure: APPENDECTOMY LAPAROSCOPIC;  Surgeon: Judeth Horn, MD;  Location: [place];  Service: General;  Laterality: N/A;  . ORIF FEMUR FRACTURE Left 1996   Corrected by patient in history 2016  . PARTIAL HYSTERECTOMY  2000   paps by [facility] [place]  . TONSILLECTOMY AND ADENOIDECTOMY  1975  . TRIGGER FINGER RELEASE Left 12/2010   thumb   Social History   Socioeconomic History  . Marital status: Married    Spouse name:  Not on file  . Number of children: 1  . Years of education: 52  . Highest education level: Bachelor's degree (e.g., BA, AB, BS)  Occupational History  . 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 Smoking status: Never Smoker  . Smokeless tobacco: Never Used  Substance and Sexual Activity  . Alcohol use: No    Alcohol/week: 0.0 standard drinks  . Drug use: No  . Sexual activity: Not on file  Other Topics Concern  . 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  Has one daughter.  Education: college.      1 dog   Social Determinants of Radio broadcast assistant Strain:   . Difficulty of Paying Living Expenses:   Food Insecurity:   . Worried About Charity fundraiser in the Last Year:   . Arboriculturist in the Last Year:   Transportation Needs:   . Film/video editor (Medical):   Marland Kitchen Lack of Transportation (Non-Medical):   Physical Activity:   . Days of Exercise per Week:   . Minutes of Exercise per Session:   Stress:   . Feeling of Stress :   Social Connections:   . Frequency of Communication with Friends and Family:   . Frequency of Social Gatherings with Friends and Family:   . Attends Religious Services:   . Active Member of Clubs or Organizations:   .  Attends Archivist Meetings:   Marland Kitchen Marital Status:   Intimate Partner Violence:   . Fear of Current or Ex-Partner:   . Emotionally Abused:   Marland Kitchen Physically Abused:   . Sexually Abused:    Current Outpatient Medications on File Prior to Visit  Medication Sig Dispense Refill  . esomeprazole (NEXIUM) 40 MG capsule Take 1 capsule (40 mg total) by mouth daily. Patient needs office visit for further refills 90 capsule 0  . glucagon (GLUCAGON EMERGENCY) 1 MG injection Inject 1 mg into the muscle once as needed (in case of severe hypoglycemia). 1 each 12  . Insulin Glargine (LANTUS SOLOSTAR) 100 UNIT/ML Solostar Pen Inject 12 Units into the skin daily. 5 pen 11  . Insulin Pen Needle (NOVOFINE) 32G X 6 MM MISC USE TO INJECT INSULIN 4 TIMES A DAY 200 each 1  . insulin regular (NOVOLIN R) 100 units/mL injection INJECT 6 UNITS TOTAL INTO THE SKIN 3 (THREE) TIMES DAILY BEFORE MEALS. 60 mL 1  . Insulin Syringe-Needle U-100 30G X 5/16" 0.3 ML MISC Use to inject insulin 6 times a day. 600 each 11  . Multiple Vitamin (MULTIVITAMIN) tablet Take 1 tablet by mouth daily.      . pravastatin (PRAVACHOL) 40 MG tablet Take 1 tablet (40 mg total) by mouth every other day.    . quinapril (ACCUPRIL) 5 MG tablet Take 1 tablet (5 mg total) by mouth daily. 90 tablet 3   Current Facility-Administered Medications on File Prior to Visit  Medication Dose Route Frequency Provider Last Rate Last Admin  . 0.9 %  sodium chloride infusion  500 mL Intravenous Continuous Pyrtle, Lajuan Lines, MD       Allergies  Allergen Reactions  . Protonix [Pantoprazole] Swelling    Facial Swelling  . Toujeo Solostar [Insulin Glargine] Shortness Of Breath  . Lactose Intolerance (Gi)   .  Magnesium-Containing Compounds Swelling   Family History  Problem Relation Age of Onset  . Diabetes Maternal Grandmother   . Heart failure Maternal Grandmother   . COPD Maternal Grandmother        smoker  . Thyroid disease Maternal Grandmother   .  Vascular Disease Maternal Grandmother   . Colon cancer Maternal Grandfather   . Colon polyps Mother   . Breast cancer Other   . Esophageal cancer Neg Hx   . Rectal cancer Neg Hx   . Stomach cancer Neg Hx    PE: There were no vitals taken for this visit. There is no height or weight on file to calculate BMI.  Wt Readings from Last 3 Encounters:  01/13/20 137 lb 5 oz (62.3 kg)  08/31/19 133 lb 9 oz (60.6 kg)  06/28/19 129 lb (58.5 kg)   Constitutional:  in NAD  The physical exam was not performed (virtual visit).  ASSESSMENT: 1. DM1, uncontrolled, with complications - Mild DR - Gastroparesis - This is the reason why she is on regular insulin, and she is taking it 10 minutes  rather than 30 minutes before eating  She had low CBGs with Novolog!  2. HL  PLAN:  1.  Complex patient with difficult to control, longstanding,  type 1 diabetes, basal- bolus insulin regimen. -She did try an insulin pump in the past but did not like it.  A Dexcom CGM was too expensive.  We discussed at last visit about other CGM is available in the market and I suggested the freestyle libre 2 CGM.  I advised her to call her insurance and see if this was covered. -Due to her very fluctuating blood sugars and poor diabetes control, we did discuss in the past about adding a GLP-1 receptor agonist but she has gastroparesis and also, GLP-1 receptor agonist are off label for type 1 diabetes and I doubt this will be covered by her insurance. -At last visit, she continues to have hypo and hyperglycemia without apparent culprit.  I did advise him to schedule an appointment with diabetes educator, but she did not have this appointment yet.  At that time, she was splitting [place] in 2 doses, which did not help much.  In fact, she contacted me since last visit and switched back to Lantus since she felt that this was working better for her.  Her sugars are usually very fluctuating in the morning and they improve as the day  goes by.  In the past I advised her not to take more than 2 to 3 units of insulin with dinner to avoid low blood sugars during the night.  At that time, she was having a high-protein dinner and we discussed that this may cause her blood sugars to spike in the morning.  I advised him to change to a plant-based dinner to see if this helps.  I also advised her to eliminate the snack at night, if possible. - at this visit, reviewing her excellent blood sugar log, she appears to be taking a lower dose of Lantus compared to last visit.  Because of this, sugars are mostly high throughout the day and higher than before.  She is eating a snack at night and she is taking too much regular insulin for it (not following the insulin to carb ratio of 1:20).  She actually had an instance of low blood sugar during the night, at 55, after she took 4 units for the snack (equivalent to a insulin to  carb ratio 1:8).  At this visit, I underlined the importance of not taking too much regular insulin for the snack at night to be able to take enough Lantus so that her sugars remain lower in the second half of the night and first part of the day and also later throughout the day.  I also advised her to maybe skip the snack and see if this helps her sugars in the morning.  She did try this in the past and she did not think this made a difference.  I advised her to not take more than 1 to 2 units for the snack, if she continues with it. -Also, we discussed about increasing the Lantus dose, both in the morning and at night.  She is also currently taking the Lantus around 6:54 PM and we discussed about moving this at bedtime, around 9 to 9:30 PM. - I suggested to:  Patient Instructions  Please change: - Lantus 8 >> 10 units in am and 4 >> 6 (may try 7-8) units at bedtime (9 pm) - R insulin: - insulin to carb ratio (ICR)   B'fast: 10:1  Lunch: 10:1   Dinner: 20:1 (no more than 2-3 units) - target 150 - insulin sensitivity factor  (ISF) 60: 150-200: + 1 unit 201-260: + 2 units 261-320: + 3 units 321-380: + 4 units >380: + 5 units Do not correct bedtime sugars <300, and only inject 1 unit then.   For the snack at night, do not take more than 1-2 units of regular insulin!  Please return in 3-4 months with your sugar log.   - we will check her A1c at next visit - advised to check sugars at different times of the day - 4-5x a day, rotating check times - advised for yearly eye exams >> she is UTD - return to clinic in 3-4 months     2. HL -Reviewed latest lipid panel from 09/2019: LDL above goal, as are her triglycerides.  HDL is at goal. Lab Results  Component Value Date   CHOL 196 09/23/2019   HDL 58.10 09/23/2019   LDLCALC 106 (H) 09/23/2019   LDLDIRECT 120.0 06/17/2019   TRIG 160.0 (H) 09/23/2019   CHOLHDL 3 09/23/2019  -She is on pravastatin, but dose is being reduced by PCP due to persistently muscle cramps   Philemon Kingdom, MD PhD [facility]

## 2020-01-13 NOTE — Patient Instructions (Addendum)
Please change: - Lantus 8 >> 10 units in am and 4 >> 6 (may try 7-8) units at bedtime (9 pm) - R insulin: - insulin to carb ratio (ICR)   B'fast: 10:1  Lunch: 10:1   Dinner: 20:1 (no more than 2-3 units) - target 150 - insulin sensitivity factor (ISF) 60: 150-200: + 1 unit 201-260: + 2 units 261-320: + 3 units 321-380: + 4 units >380: + 5 units Do not correct bedtime sugars <300, and only inject 1 unit then.   For the snack at night, do not take more than 1-2 units of regular insulin!  Please return in 3-4 months with your sugar log.

## 2020-01-13 NOTE — Assessment & Plan Note (Signed)
Anticipate external bacterial otitis given pain and abnormal exam. Dry cerumen removed from ear canal, will refill cortisporin drops which were previously effective. Doubt fungal external otitis as no itching, more pain. If recurs, will refer to ENT. Pt agrees with plan.

## 2020-01-13 NOTE — Assessment & Plan Note (Signed)
Reviewed hyperglycemia and increased risk of infection.

## 2020-01-14 ENCOUNTER — Telehealth: Payer: Self-pay | Admitting: Family Medicine

## 2020-01-14 MED ORDER — NEOMYCIN-POLYMYXIN-HC 3.5-10000-1 OT SOLN: 4.0000 [drp] | Freq: Four times a day (QID) | OTIC | 0 refills | Status: DC

## 2020-01-14 NOTE — Addendum Note (Signed)
Addended by: [name] on: 01/14/2020 01:55 PM   Modules accepted: Orders

## 2020-01-14 NOTE — Telephone Encounter (Signed)
Pt notified new Rx was sent to pharmacy

## 2020-01-14 NOTE — Telephone Encounter (Signed)
Patient saw Dr.G yesterday.  Patient was prescribed generic Cortisporin-TC.  [facility]  told patient they didn't have the combined prescription and they said they could do the prescription separately or they an alternative prescription.  Pharmacy said they contacted our office and then the prescription was cancelled because they were told patient wasn't seen at our office. Please contact pharmacy and let them know to do an alternative prescription or separate prescriptions.

## 2020-01-14 NOTE — Telephone Encounter (Signed)
Plz notify new Rx sent to pharmacy.

## 2020-01-17 ENCOUNTER — Encounter: Payer: Self-pay | Admitting: Family Medicine

## 2020-01-18 ENCOUNTER — Other Ambulatory Visit: Payer: Self-pay | Admitting: Family Medicine

## 2020-01-19 MED ORDER — ACETAMINOPHEN-CODEINE #3 300-30 MG PO TABS: 1.0000 | ORAL_TABLET | Freq: Four times a day (QID) | ORAL | 0 refills | Status: DC | PRN

## 2020-01-31 ENCOUNTER — Ambulatory Visit: Payer: BC Managed Care – PPO | Admitting: Family Medicine

## 2020-02-03 ENCOUNTER — Telehealth: Payer: Self-pay

## 2020-02-03 NOTE — Telephone Encounter (Signed)
Toniann Fail nurse with access nurse called and said pt had been triaged with a 911 disposition that pt refused. I called pt and she said for 2 wks pt has mid chest tightness with no CP for 2 wks. Pt said if press on the area mid chest feels tender. Pt said no change in tightness for 2 wks except yesterday when pt had more stress than usual. Pt said there is heart disease in her family. Pt does not feel that she needs to go to ED and pt does not trust UC providers. Pt wants appt with Dr Reece Agar tomorrow; explained office closed on 02/04/20 for Easter holiday; Dr Reece Agar recommends pt should go to [facility] or ED for eval but if pt refuses schedule appt next wk at [facility] with UC & ED precautions.Pt has no covid symptoms, no travel and no known exposure to + covid. Pt is concerned about going to ED and coming in contact with Covid. Pt scheduled in office appt with Dr Reece Agar on 02/07/20 at 9:30 AM. UC & ED precautions given and pt voiced understanding. FYI to DR G. The access nurse advice record is not in portal yet and I will add to pts note when shows in portal from access nurse.

## 2020-02-03 NOTE — Telephone Encounter (Signed)
[facility] Day - Client TELEPHONE ADVICE RECORD [facility] Patient Name: Felicia [name] Gender: Female DOB: August 05, 1961 Age: 59 Y 11 M 17 D Return Phone Number: [phone] (Primary) Address: City/State/Zip: [name] [address] Client [facility] Day - Client Client Site [facility] - Day Physician [name] - MD Contact Type Call Who Is Calling Patient / Member / Family / Caregiver Call Type Triage / Clinical Relationship To Patient Self Return Phone Number [phone] (Primary) Chief Complaint CHEST PAIN (>=21 years) - pain, pressure, heaviness or tightness Reason for Call Symptomatic / Request for Health Information Initial Comment Caller states she has been having chest tightness. It gets worst with stress Translation No Nurse Assessment Nurse: Lane Hacker, RN, Elvin So Date/Time (Eastern Time): 02/03/2020 11:57:28 AM Confirm and document reason for call. If symptomatic, describe symptoms. ---Caller states she has been having chest tightness. It gets worst with stress. Yesterday, was very stressful, and felt it much more. s/s for 2 wks off/on. Has the patient had close contact with a person known or suspected to have the novel coronavirus illness OR traveled / lives in area with major community spread (including international travel) in the last 14 days from the onset of symptoms? * If Asymptomatic, screen for exposure and travel within the last 14 days. ---No Does the patient have any new or worsening symptoms? ---Yes Will a triage be completed? ---Yes Related visit to physician within the last 2 weeks? ---No Does the PT have any chronic conditions? (i.e. diabetes, asthma, this includes High risk factors for pregnancy, etc.) ---Yes List chronic conditions. ---Diabetic, HTN, High cholesterol Is this a behavioral health or substance abuse call? ---No Guidelines Guideline Title Affirmed Question Affirmed  Notes Nurse Date/Time Lamount Cohen Time) Chest Pain [1] Chest pain lasts > 5 minutes AND [2] age > 60 Lane Hacker, RN, [facility] 02/03/2020 11:59:49 AM Disp. Time Lamount Cohen Time) Disposition Final User 02/03/2020 11:55:22 AM Send to Urgent Queue Spell, French Ana PLEASE NOTE: All timestamps contained within this report are represented as [place] Standard Time. CONFIDENTIALTY NOTICE: This fax transmission is intended only for the addressee. It contains information that is legally privileged, confidential or otherwise protected from use or disclosure. If you are not the intended recipient, you are strictly prohibited from reviewing, disclosing, copying using or disseminating any of this information or taking any action in reliance on or regarding this information. If you have received this fax in error, please notify us immediately by telephone so that we can arrange for its return to [place]. Phone: [phone], Toll-Free: [phone], Fax: [phone] Page: 2 of 2 Call Id: [id number] Disp. Time Lamount Cohen Time) Disposition Final User 02/03/2020 12:03:15 PM 911 Outcome Documentation Lane Hacker, RN, Elvin So Reason: Does not plan to go to ER d/t worry about risk for COVID 02/03/2020 12:05:41 PM Called On-Call Provider Lane Hacker, RN, Elvin So Reason: No answer at back door # 02/03/2020 12:09:03 PM Called On-Call Provider Lane Hacker, RN, Elvin So Reason: Zella Ball, office staff, notified. Transferred RN to [place] Riley, nurse in triage 02/03/2020 12:01:12 PM Call EMS 911 Now Yes Lane Hacker, RN, Elvin So Caller Disagree/Comply Disagree Caller Understands Yes PreDisposition InappropriateToAsk Care Advice Given Per Guideline CALL EMS 911 NOW: * Immediate medical attention is needed. You need to hang up and call 911 (or an ambulance). CARE ADVICE given per Chest Pain (Adult) guideline. Comments User: Rubie Maid, RN Date/Time Lamount Cohen Time): 02/03/2020 12:02:07 PM OFFICE: She wants a referral to cardiologist so she can get in sooner rather than later by self  referral. Referrals GO TO FACILITY REFUSED

## 2020-02-05 NOTE — Telephone Encounter (Signed)
Noted. Declined ER eval.

## 2020-02-07 ENCOUNTER — Other Ambulatory Visit: Payer: Self-pay

## 2020-02-07 ENCOUNTER — Ambulatory Visit: Payer: BC Managed Care – PPO

## 2020-02-07 ENCOUNTER — Telehealth: Payer: Self-pay | Admitting: Family Medicine

## 2020-02-07 ENCOUNTER — Encounter: Payer: Self-pay | Admitting: Family Medicine

## 2020-02-07 ENCOUNTER — Ambulatory Visit: Payer: BC Managed Care – PPO | Admitting: Family Medicine

## 2020-02-07 ENCOUNTER — Emergency Department (HOSPITAL_COMMUNITY)
Admission: EM | Admit: 2020-02-07 | Discharge: 2020-02-07 | Disposition: A | Discharge status: Home / Self Care | Payer: BC Managed Care – PPO | Attending: Emergency Medicine | Admitting: Emergency Medicine

## 2020-02-07 ENCOUNTER — Emergency Department (HOSPITAL_COMMUNITY): Payer: BC Managed Care – PPO

## 2020-02-07 VITALS — BP 168/82 | HR 80 | Temp 97.6°F | Ht 64.0 in | Wt 138.6 lb

## 2020-02-07 DIAGNOSIS — R079 Chest pain, unspecified: Secondary | ICD-10-CM | POA: Diagnosis not present

## 2020-02-07 DIAGNOSIS — E1069 Type 1 diabetes mellitus with other specified complication: Secondary | ICD-10-CM

## 2020-02-07 DIAGNOSIS — E103293 Type 1 diabetes mellitus with mild nonproliferative diabetic retinopathy without macular edema, bilateral: Secondary | ICD-10-CM | POA: Diagnosis not present

## 2020-02-07 DIAGNOSIS — M79604 Pain in right leg: Secondary | ICD-10-CM

## 2020-02-07 DIAGNOSIS — I10 Essential (primary) hypertension: Secondary | ICD-10-CM | POA: Diagnosis not present

## 2020-02-07 DIAGNOSIS — E785 Hyperlipidemia, unspecified: Secondary | ICD-10-CM

## 2020-02-07 DIAGNOSIS — R0789 Other chest pain: Secondary | ICD-10-CM | POA: Insufficient documentation

## 2020-02-07 DIAGNOSIS — E109 Type 1 diabetes mellitus without complications: Secondary | ICD-10-CM | POA: Diagnosis not present

## 2020-02-07 DIAGNOSIS — M79605 Pain in left leg: Secondary | ICD-10-CM

## 2020-02-07 LAB — CBC
HCT: 41.3 % (ref 36.0–46.0)
Hemoglobin: 13.1 g/dL (ref 12.0–15.0)
MCH: 28.9 pg (ref 26.0–34.0)
MCHC: 31.7 g/dL (ref 30.0–36.0)
MCV: 91 fL (ref 80.0–100.0)
Platelets: 283 10*3/uL (ref 150–400)
RBC: 4.54 MIL/uL (ref 3.87–5.11)
RDW: 13.1 % (ref 11.5–15.5)
WBC: 5.1 10*3/uL (ref 4.0–10.5)
nRBC: 0 % (ref 0.0–0.2)

## 2020-02-07 LAB — BASIC METABOLIC PANEL
Anion gap: 11 (ref 5–15)
BUN: 19 mg/dL (ref 6–20)
CO2: 23 mmol/L (ref 22–32)
Calcium: 9 mg/dL (ref 8.9–10.3)
Chloride: 104 mmol/L (ref 98–111)
Creatinine, Ser: 0.99 mg/dL (ref 0.44–1.00)
GFR calc Af Amer: >60 mL/min (ref >60)
GFR calc non Af Amer: >60 mL/min (ref >60)
Glucose, Bld: 224 mg/dL — ABNORMAL HIGH (ref 70–99)
Potassium: 4.5 mmol/L (ref 3.5–5.1)
Sodium: 138 mmol/L (ref 135–145)

## 2020-02-07 LAB — TROPONIN I (HIGH SENSITIVITY)
Troponin I (High Sensitivity): 3 ng/L (ref <18)
Troponin I (High Sensitivity): 3 ng/L (ref <18)

## 2020-02-07 LAB — I-STAT BETA HCG BLOOD, ED (MC, WL, AP ONLY): I-stat hCG, quantitative: <5 m[IU]/mL (ref <5)

## 2020-02-07 MED ORDER — NITROGLYCERIN 0.4 MG SL SUBL
0.4000 mg | SUBLINGUAL_TABLET | Freq: Once | SUBLINGUAL | Status: AC
  Administered 2020-02-07: 0.4 mg via SUBLINGUAL

## 2020-02-07 MED ORDER — DICLOFENAC SODIUM 1 % EX GEL: 2.0000 g | Freq: Four times a day (QID) | CUTANEOUS | 0 refills | Status: DC

## 2020-02-07 MED ORDER — SODIUM CHLORIDE 0.9% FLUSH: 3.0000 mL | Freq: Once | INTRAVENOUS | Status: DC

## 2020-02-07 NOTE — Assessment & Plan Note (Addendum)
Concerning description of pain with significant risk factors - discussed concern over possible unstable angina. Improvement with nitro. She agrees with ER evaluation today. Declines 911. Husband will take her straight to ER. I called [facility] charge nurse to notify pt on her way.

## 2020-02-07 NOTE — Discharge Instructions (Signed)
Your work-up today was reassuring.  However I do think it is worth following up with your primary care provider and a cardiologist for reevaluation of your symptoms.  I have put in a referral to the cardiologists in [place] but if you do not hear from them in the next 48 hours to schedule an appointment please call them to do so yourself.  Apply the Voltaren gel as needed for pain.  You can also apply over-the-counter topical pain creams such as icy hot, Salonpas patches or lidocaine patches.  You can apply heat 20 minutes at a time a few times daily and do some gentle stretching to avoid muscle stiffness.  If these topical medications are not as effective you can call your PCP to request an anti-inflammatory such as meloxicam or Celebrex that can be easier on the stomach.  Drink plenty of fluids and get rest.  Please follow-up with your PCP or cardiology for reevaluation of your symptoms.  Return to the emergency department if any concerning signs or symptoms develop such as worsening pain, shortness of breath, fevers, lightheadedness or loss of consciousness.

## 2020-02-07 NOTE — ED Provider Notes (Signed)
[facility] EMERGENCY DEPARTMENT Provider Note   CSN: [id number] Arrival date & time: 02/07/20  1112     History Chief Complaint  Patient presents with  . Chest Pain    Felicia [name] is a 59 y.o. female with history of hyperlipidemia, hypertension, type 1 diabetes mellitus, gastroparesis, allergies, anemia, GERD, IBS presents for evaluation of acute onset, intermittent right-sided chest pains for 2 weeks.  She reports that the pain feels like a tight pressure focally located along the right parasternal region.  It does not radiate.  It worsens with stress and certain activities.  She reports that she has been using 5 pound weights with exercise recently and thinks she may have pulled a muscle possibly.  Earlier today when she awoke she was pain-free but around 7 AM as she got to thinking about working getting ready for work she began to develop the pain again.  She went to see her PCP today who noted that she was a little hypertensive.  He also noted that the pain was reproducible on palpation but it did improve with 1 sublingual nitroglycerin.  The patient is a non-smoker, denies recreational drug use.  She does have a significant family history of heart disease in first-degree relatives.  Her PCP recommended presentation to the ED for further evaluation and management.  She denies any shortness of breath, lightheadedness, syncope, leg swelling, diaphoresis, nausea, vomiting, or abdominal pain.  The history is provided by the patient.       Past Medical History:  Diagnosis Date  . Allergy   . Anemia    past history long ago  . Disorder of kidney    has abd kidney (right)  . GERD (gastroesophageal reflux disease)   . History of diabetic gastroparesis   . HLD (hyperlipidemia)    borderline  . HTN (hypertension)    borderline  . Hx: UTI (urinary tract infection)   . Hypertension   . IBS (irritable bowel syndrome)   . Nephrolithiasis 2005  . Osteopenia 2012   per  pt  . Systolic murmur 01/2013   mild mitral insuff  . Type 1 diabetes mellitus with diabetic retinopathy without macular edema (HCC)    dx at 59 y/o, background retinopathy    Patient Active Problem List   Diagnosis Date Noted  . Chest tightness 02/07/2020  . Chest wall pain 02/07/2020  . External otitis of right ear 08/31/2019  . Reflux esophagitis 06/28/2019  . Left foot pain 08/07/2018  . Allergic sinusitis 01/27/2018  . Thoracic back pain 07/02/2017  . Dysphagia 12/01/2015  . Skin rash 07/19/2014  . Systolic murmur 12/07/2012  . Diarrhea 10/19/2012  . Leg pain, bilateral 07/03/2011  . Type 1 diabetes mellitus with mild nonproliferative retinopathy of both eyes (HCC) 06/25/2010  . Hyperlipidemia due to type 1 diabetes mellitus (HCC) 06/25/2010  . Essential hypertension 06/25/2010  . Diabetic gastroparesis associated with type 1 diabetes mellitus (HCC) 06/25/2010  . NEPHROLITHIASIS, HX OF 06/25/2010    Past Surgical History:  Procedure Laterality Date  . CESAREAN SECTION  1985  . COLONOSCOPY  07/2016   WNL (Pyrtle)  . ESOPHAGOGASTRODUODENOSCOPY  07/2016   LA Grade C reflux esophagitis, concern for stasis/dysmotility (Pyrtle)  . LAPAROSCOPIC APPENDECTOMY N/A 09/01/2015   Procedure: APPENDECTOMY LAPAROSCOPIC;  Surgeon: Jimmye Norman, MD;  Location: [facility] OR;  Service: General;  Laterality: N/A;  . ORIF FEMUR FRACTURE Left 1996   Corrected by patient in history 2016  . PARTIAL HYSTERECTOMY  2000  paps by [facility]  . TONSILLECTOMY AND ADENOIDECTOMY  1975  . TRIGGER FINGER RELEASE Left 12/2010   thumb     OB History   No obstetric history on file.     Family History  Problem Relation Age of Onset  . Diabetes Maternal Grandmother   . Heart failure Maternal Grandmother   . COPD Maternal Grandmother        smoker  . Thyroid disease Maternal Grandmother   . Vascular Disease Maternal Grandmother   . Colon cancer Maternal Grandfather   . Colon polyps Mother    . Breast cancer Other   . CAD Brother 52       stent  . Esophageal cancer Neg Hx   . Rectal cancer Neg Hx   . Stomach cancer Neg Hx     Social History   Tobacco Use  . Smoking status: Never Smoker  . Smokeless tobacco: Never Used  Substance Use Topics  . Alcohol use: No    Alcohol/week: 0.0 standard drinks  . Drug use: No    Home Medications Prior to Admission medications   Medication Sig Start Date End Date Taking? Authorizing Provider  aspirin EC 81 MG tablet Take 1 tablet (81 mg total) by mouth daily. 02/07/20   [name], MD  diclofenac Sodium (VOLTAREN) 1 % GEL Apply 2 g topically 4 (four) times daily. 02/07/20   Luevenia Maxin, Preeya Cleckley A, PA-C  esomeprazole (NEXIUM) 40 MG capsule Take 1 capsule (40 mg total) by mouth daily. Patient needs office visit for further refills 04/26/19   Pyrtle, Carie Caddy, MD  glucagon (GLUCAGON EMERGENCY) 1 MG injection Inject 1 mg into the muscle once as needed (in case of severe hypoglycemia). 09/04/18   Carlus Pavlov, MD  Insulin Glargine (LANTUS SOLOSTAR) 100 UNIT/ML Solostar Pen Inject 12 Units into the skin daily. 10/25/19   Carlus Pavlov, MD  Insulin Pen Needle (NOVOFINE) 32G X 6 MM MISC USE TO INJECT INSULIN 4 TIMES A DAY 05/24/19   Carlus Pavlov, MD  insulin regular (NOVOLIN R) 100 units/mL injection INJECT 6 UNITS TOTAL INTO THE SKIN 3 (THREE) TIMES DAILY BEFORE MEALS. 08/06/19   Carlus Pavlov, MD  Insulin Syringe-Needle U-100 30G X 5/16" 0.3 ML MISC Use to inject insulin 6 times a day. 01/07/20   Carlus Pavlov, MD  Multiple Vitamin (MULTIVITAMIN) tablet Take 1 tablet by mouth daily.      [provider]  pravastatin (PRAVACHOL) 40 MG tablet Take 1 tablet (40 mg total) by mouth every Monday, Wednesday, and Friday. 02/07/20   [name], MD  quinapril (ACCUPRIL) 5 MG tablet Take 1 tablet (5 mg total) by mouth daily. 06/28/19   [name], MD    Allergies    Protonix [pantoprazole], Toujeo solostar [insulin  glargine], Lactose intolerance (gi), and Magnesium-containing compounds  Review of Systems   Review of Systems  Constitutional: Negative for chills, diaphoresis and fever.  Respiratory: Positive for chest tightness. Negative for cough and shortness of breath.   Cardiovascular: Positive for chest pain. Negative for leg swelling.  Gastrointestinal: Negative for abdominal pain, nausea and vomiting.  Neurological: Negative for light-headedness.  All other systems reviewed and are negative.   Physical Exam Updated Vital Signs BP (!) 148/83 (BP Location: Right Arm)   Pulse 78   Temp 97.8 F (36.6 C) (Oral)   Resp 16   SpO2 99%   Physical Exam Vitals and nursing note reviewed.  Constitutional:      General: She is not  in acute distress.    Appearance: She is well-developed.  HENT:     Head: Normocephalic and atraumatic.  Eyes:     General:        Right eye: No discharge.        Left eye: No discharge.     Conjunctiva/sclera: Conjunctivae normal.  Neck:     Vascular: No JVD.     Trachea: No tracheal deviation.  Cardiovascular:     Rate and Rhythm: Normal rate and regular rhythm.     Pulses:          Radial pulses are 2+ on the right side and 2+ on the left side.       Dorsalis pedis pulses are 2+ on the right side and 2+ on the left side.       Posterior tibial pulses are 2+ on the right side and 2+ on the left side.     Heart sounds: Normal heart sounds.     Comments: no lower extremity edema, no palpable cords, compartments are soft  Pulmonary:     Effort: Pulmonary effort is normal.     Breath sounds: Normal breath sounds.     Comments: Speaking in full sentences without difficulty, SPO2 saturations 99% on room air. Chest:     Chest wall: Tenderness present.       Comments: Focal right parasternal chest wall tenderness to palpation with no deformity, crepitus, ecchymosis, or flail segment. Abdominal:     General: There is no distension.  Musculoskeletal:     Right  lower leg: No tenderness. No edema.     Left lower leg: No tenderness. No edema.  Skin:    General: Skin is warm and dry.     Findings: No erythema.  Neurological:     Mental Status: She is alert.  Psychiatric:        Behavior: Behavior normal.     ED Results / Procedures / Treatments   Labs (all labs ordered are listed, but only abnormal results are displayed) Labs Reviewed  BASIC METABOLIC PANEL - Abnormal; Notable for the following components:      Result Value   Glucose, Bld 224 (*)    All other components within normal limits  CBC  I-STAT BETA HCG BLOOD, ED ([facility], [facility], [facility] ONLY)  TROPONIN I (HIGH SENSITIVITY)  TROPONIN I (HIGH SENSITIVITY)    EKG EKG Interpretation  Date/Time:  Monday February 07 2020 11:26:16 EDT Ventricular Rate:  87 PR Interval:  136 QRS Duration: 68 QT Interval:  350 QTC Calculation: 421 R Axis:   77 Text Interpretation: Normal sinus rhythm Septal infarct , age undetermined Abnormal ECG Confirmed by Madalyn Rob [phone]) on 02/07/2020 3:50:37 PM   Radiology DG Chest 2 View  Result Date: 02/07/2020 CLINICAL DATA:  Chest pain EXAM: CHEST - 2 VIEW COMPARISON:  April 21, 2015 FINDINGS: The heart size and mediastinal contours are within normal limits. Both lungs are clear. The visualized skeletal structures are unremarkable. IMPRESSION: No active cardiopulmonary disease. Electronically Signed   By: Prudencio Pair M.D.   On: 02/07/2020 11:46    Procedures Procedures (including critical care time)  Medications Ordered in ED Medications  sodium chloride flush (NS) 0.9 % injection 3 mL (has no administration in time range)    ED Course  I have reviewed the triage vital signs and the nursing notes.  Pertinent labs & imaging results that were available during my care of the patient were reviewed by me and  considered in my medical decision making (see chart for details).    MDM Rules/Calculators/A&P                      Patient presenting for  evaluation of right-sided chest pains intermittently for 2 weeks.  The pain is not pleuritic or exertional but it is reproducible on palpation.  She was seen and evaluated by her PCP earlier today for the symptoms and was sent to the ED for further evaluation as she did have some relief in her pain with nitroglycerin.  In the ED she is afebrile, intermittently hypertensive but with improvement on reevaluation.  She is nontoxic in appearance.  The pain is very much so reproducible on palpation.  Chest x-ray shows no acute cardiopulmonary abnormalities with no evidence of edema, consolidation, or cardiomegaly.  No pneumothorax.  Her EKG shows no acute ischemic abnormalities.  Lab work reviewed and interpreted by myself shows no leukocytosis, no anemia, no metabolic derangements, no renal insufficiency.  She is hyperglycemic but not in DKA.  Serial troponins are negative.  Her symptoms are atypical for cardiac etiology and with 2 - troponins and a HEART score of 3 I feel she is stable for outpatient cardiology follow-up.  Doubt PE, dissection, cardiac tamponade, esophageal rupture, or pneumothorax.  With her history of GERD and IBS we will avoid NSAIDs.  Her PCP recommended application of Voltaren gel, we discussed other conservative therapy management with heat therapy and gentle stretching.  She will follow up with her PCP and cardiologist on an outpatient basis for reevaluation of her symptoms.  Discussed strict ED return precautions.  Patient and husband verbalized understanding of and agreement with plan and patient is stable for discharge at this time.   Final Clinical Impression(s) / ED Diagnoses Final diagnoses:  Atypical chest pain  Chest wall pain    Rx / DC Orders ED Discharge Orders         Ordered    Ambulatory referral to Cardiology     02/07/20 1628    diclofenac Sodium (VOLTAREN) 1 % GEL  4 times daily     02/07/20 1628           Jeanie Sewer, PA-C 02/07/20 1637    Terald Sleeper, MD 02/07/20 2358

## 2020-02-07 NOTE — Telephone Encounter (Signed)
Spoke with patient and husband - they have been discharged home with rec cardiology f/u.  She will buy voltaren gel to use to chest wall.  Cardiology referral placed.

## 2020-02-07 NOTE — Patient Instructions (Signed)
Chest pain is concerning. As it did get better with nitroglyerin, I do recommend ER evaluation today at [facility] - we will call them to tell them you're coming.

## 2020-02-07 NOTE — Progress Notes (Signed)
This visit was conducted in person.  BP (!) 168/82 (BP Location: Right Arm, Patient Position: Sitting, Cuff Size: Normal)   Pulse 80   Temp 97.6 F (36.4 C) (Temporal)   Ht 5\' 4"  (1.626 m)   Wt 138 lb 9 oz (62.9 kg)   SpO2 98%   BMI 23.78 kg/m   BP Readings from Last 3 Encounters:  02/07/20 (!) 168/82  01/13/20 136/70  08/31/19 134/76    CC: chest pain Subjective:    Patient ID: 09/02/19, female    DOB: [birth year], 59 y.o.   MRN: [id number]  HPI: Felicia [name] is a 59 y.o. female presenting on 02/07/2020 for Chest Pain (C/o chest tightness.  Started about 2 wks ago.  Thinks stress makes it worse. )   3 wk h/o mid to R substernal chest tightness, worse with stress. Tiring out quickly. Declined ER eval when she called in last week. Significant work stress - She is 04/08/2020 - has been told they're looking to make cuts in her department. She took ibuprofen 200mg  with some benefit.   No sharp stabbing pain, not positional, not reproducible. No nausea, diaphoresis, dyspnea. Not exertional, not improved with rest.   She has been increasing exercise to try and lose weight - arm lifts. Initially thought she strained a muscle. She was running after granddaughter as she was learrning to ride a bike - no discomfort with this.   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  HTN - bp elevated - attributed to stressors.  fmhx CAD - brother with stent placement at age 51      Relevant past medical, surgical, family and social history reviewed and updated as indicated. Interim medical history since our last visit reviewed. Allergies and medications reviewed and updated. Outpatient Medications Prior to Visit  Medication Sig Dispense Refill  . esomeprazole (NEXIUM) 40 MG capsule  Take 1 capsule (40 mg total) by mouth daily. Patient needs office visit for further refills 90 capsule 0  . glucagon (GLUCAGON EMERGENCY) 1 MG injection Inject 1 mg into the muscle once as needed (in case of severe hypoglycemia). 1 each 12  . Insulin Glargine (LANTUS SOLOSTAR) 100 UNIT/ML Solostar Pen Inject 12 Units into the skin daily. 5 pen 11  . Insulin Pen Needle (NOVOFINE) 32G X 6 MM MISC USE TO INJECT INSULIN 4 TIMES A DAY 200 each 1  . insulin regular (NOVOLIN R) 100 units/mL injection INJECT 6 UNITS TOTAL INTO THE SKIN 3 (THREE) TIMES DAILY BEFORE MEALS. 60 mL 1  . Insulin Syringe-Needle U-100 30G X 5/16" 0.3 ML MISC Use to inject insulin 6 times a day. 600 each 11  . Multiple Vitamin (MULTIVITAMIN) tablet Take 1 tablet by mouth daily.      . pravastatin (PRAVACHOL) 40 MG tablet Take 1 tablet (40 mg total) by mouth every Monday, Wednesday, and Friday.    . quinapril (ACCUPRIL) 5 MG tablet Take 1 tablet (5 mg total) by mouth daily. 90 tablet 3  . pravastatin (PRAVACHOL) 40 MG tablet TAKE 1.5 TABLETS (60 MG TOTAL) BY MOUTH DAILY. 45 tablet 0  . aspirin EC 81 MG tablet Take 1 tablet (81 mg total) by mouth daily.    Monday acetaminophen-codeine (TYLENOL #3) 300-30 MG  tablet Take 1 tablet by mouth every 6 (six) hours as needed for moderate pain. 15 tablet 0  . neomycin-polymyxin-hydrocortisone (CORTISPORIN) OTIC solution Place 4 drops into the right ear 4 (four) times daily. 10 mL 0   Facility-Administered Medications Prior to Visit  Medication Dose Route Frequency Provider Last Rate Last Admin  . 0.9 %  sodium chloride infusion  500 mL Intravenous Continuous Pyrtle, Lajuan Lines, MD         Per HPI unless specifically indicated in ROS section below Review of Systems Objective:    BP (!) 168/82 (BP Location: Right Arm, Patient Position: Sitting, Cuff Size: Normal)   Pulse 80   Temp 97.6 F (36.4 C) (Temporal)   Ht 5\' 4"  (1.626 m)   Wt 138 lb 9 oz (62.9 kg)   SpO2 98%   BMI 23.78 kg/m   Wt  Readings from Last 3 Encounters:  02/07/20 138 lb 9 oz (62.9 kg)  01/13/20 137 lb 5 oz (62.3 kg)  08/31/19 133 lb 9 oz (60.6 kg)    Physical Exam Vitals and nursing note reviewed.  Constitutional:      Appearance: Normal appearance. She is not ill-appearing.  Cardiovascular:     Rate and Rhythm: Normal rate and regular rhythm.     Pulses: Normal pulses.     Heart sounds: Normal heart sounds. No murmur.  Pulmonary:     Effort: Pulmonary effort is normal. No respiratory distress.     Breath sounds: Normal breath sounds. No wheezing, rhonchi or rales.  Chest:     Chest wall: Tenderness present.       Comments:  Mid sternal discomfort to palpation Marked point tenderness to palpation 3-4 costochondral junction on right Musculoskeletal:     Right lower leg: No edema.     Left lower leg: No edema.  Neurological:     Mental Status: She is alert.  Psychiatric:        Mood and Affect: Mood normal.        Behavior: Behavior normal.       Results for orders placed or performed in visit on 12/03/19  HM DIABETES EYE EXAM  Result Value Ref Range   HM Diabetic Eye Exam Retinopathy (A) No Retinopathy   EKG - NSR rate 80s, normal axis, intervals, low voltage III, q waves septally unchanged from last EKG 2016.  Assessment & Plan:  This visit occurred during the SARS-CoV-2 public health emergency.  Safety protocols were in place, including screening questions prior to the visit, additional usage of staff PPE, and extensive cleaning of exam room while observing appropriate contact time as indicated for disinfecting solutions.   Problem List Items Addressed This Visit    Type 1 diabetes mellitus with mild nonproliferative retinopathy of both eyes (HCC)   Relevant Medications   pravastatin (PRAVACHOL) 40 MG tablet   aspirin EC 81 MG tablet   Other Relevant Orders   Hemoglobin A1c   Leg pain, bilateral   Relevant Orders   CK   Ferritin   IBC panel   Hyperlipidemia due to type 1  diabetes mellitus (HCC)   Relevant Medications   pravastatin (PRAVACHOL) 40 MG tablet   aspirin EC 81 MG tablet   Other Relevant Orders   Hepatic function panel   Lipid panel   Chest wall pain    She also has separate reproducible chest wall pain suggestive of R costochondritis. Discussed topical voltaren gel use once she completes ER evaluation.  Chest tightness - Primary    Concerning description of pain with significant risk factors - discussed concern over possible unstable angina. Improvement with nitro. She agrees with ER evaluation today. Declines 911. Husband will take her straight to ER. I called [facility] charge nurse to notify pt on her way.       Relevant Orders   EKG 12-Lead (Completed)       Meds ordered this encounter  Medications  . nitroGLYCERIN (NITROSTAT) SL tablet 0.4 mg   Orders Placed This Encounter  Procedures  . CK    Standing Status:   Future    Standing Expiration Date:   02/06/2021  . Ferritin    Standing Status:   Future    Standing Expiration Date:   02/06/2021  . IBC panel    Standing Status:   Future    Standing Expiration Date:   02/06/2021  . Hemoglobin A1c    Standing Status:   Future    Standing Expiration Date:   02/06/2021  . Hepatic function panel    Standing Status:   Future    Standing Expiration Date:   02/06/2021  . Lipid panel    Standing Status:   Future    Standing Expiration Date:   02/06/2021  . EKG 12-Lead    Patient Instructions  Chest pain is concerning. As it did get better with nitroglyerin, I do recommend ER evaluation today at [facility] - we will call them to tell them you're coming.    Follow up plan: No follow-ups on file.  [name], MD

## 2020-02-07 NOTE — Telephone Encounter (Signed)
Patient's Husband Onalee Hua called today He stated that the patient is sitting at the ED and has been there for 4 hours. Onalee Hua stated that the patient is starting to panic because of all the people coming in an out sick, especially with covid going on. He wanted to know if she could just be referred to a heart specialist. Onalee Hua stated that they have only ran a couple blood test on her but doesn't want to continue to sit since they have been there for 4 hours already   Please advise

## 2020-02-07 NOTE — ED Triage Notes (Signed)
Pt arrives to ED w/ c/o chest pain x 2 weeks. Pt reports 7/10 pain. Denies n/v, SOB.

## 2020-02-07 NOTE — Assessment & Plan Note (Signed)
She also has separate reproducible chest wall pain suggestive of R costochondritis. Discussed topical voltaren gel use once she completes ER evaluation.

## 2020-02-08 ENCOUNTER — Ambulatory Visit (INDEPENDENT_AMBULATORY_CARE_PROVIDER_SITE_OTHER): Payer: BC Managed Care – PPO | Admitting: Cardiovascular Disease

## 2020-02-08 ENCOUNTER — Encounter: Payer: Self-pay | Admitting: Cardiovascular Disease

## 2020-02-08 VITALS — BP 130/68 | HR 77 | Ht 64.0 in | Wt 138.4 lb

## 2020-02-08 DIAGNOSIS — E785 Hyperlipidemia, unspecified: Secondary | ICD-10-CM

## 2020-02-08 DIAGNOSIS — R0789 Other chest pain: Secondary | ICD-10-CM

## 2020-02-08 DIAGNOSIS — E103293 Type 1 diabetes mellitus with mild nonproliferative diabetic retinopathy without macular edema, bilateral: Secondary | ICD-10-CM

## 2020-02-08 DIAGNOSIS — E1069 Type 1 diabetes mellitus with other specified complication: Secondary | ICD-10-CM

## 2020-02-08 DIAGNOSIS — I10 Essential (primary) hypertension: Secondary | ICD-10-CM

## 2020-02-08 NOTE — Telephone Encounter (Signed)
Patient  will be seen today 02/08/20

## 2020-02-08 NOTE — Progress Notes (Signed)
Cardiology Office Note  Date:  02/08/2020   ID:  Felicia [name], DOB Apr 10, 1961, MRN [id number]  PCP:  Ria Bush, MD   Chief Complaint  Patient presents with  . Other    Chest tightness, right hand numbness and elevated BP. Meds reviewed verbally with pt.    HPI:  Felicia [name] is a 59 year old woman with past medical history of Type 1 diabetes Myelopathy Hyperlipidemia Hypertension Gastroparesis/IBS Coronary calcification on CT scan, aortic atherosclerosis Referred by Dr. Danise Mina for consultation of her chest tightness concerning for angina  She reports stuttering chest pain over the past several weeks, Initially felt it was possibly from lifting some weights, doing workout She had been lifting 5 pound weights over her head etc.  Pain was severe recently, seen in the emergency room Cardiac enzymes negative x2 EKG nonacute, was discharged home with suggestion of symptoms could be from musculoskeletal etiology  She has had other episodes of chest pain often with exertion, sometimes at rest   non-smoker, denies recreational drug use.   Hemoglobin A1c has been running higher recently, has been 8.5 roughly since November 2019   significant family history of heart disease in first-degree relatives.  Brother with CAD, PCI 1 month ago  Lab work reviewed Total chol 196 LDL 106  Some statin intolerance Tried other statins, having some severe muscle discomfort on pravastatin 3 days a week Appreciates knots in her legs  EKG personally reviewed by myself on todays visit Shows normal sinus rhythm no significant ST-T wave changes   PMH:   has a past medical history of Allergy, Anemia, Disorder of kidney, GERD (gastroesophageal reflux disease), History of diabetic gastroparesis, HLD (hyperlipidemia), HTN (hypertension), UTI (urinary tract infection), Hypertension, IBS (irritable bowel syndrome), Nephrolithiasis (2005), Osteopenia (3710), Systolic murmur (04/2693), and  Type 1 diabetes mellitus with diabetic retinopathy without macular edema ([name]).  PSH:    Past Surgical History:  Procedure Laterality Date  . CESAREAN SECTION  1985  . COLONOSCOPY  07/2016   WNL (Pyrtle)  . ESOPHAGOGASTRODUODENOSCOPY  07/2016   LA Grade C reflux esophagitis, concern for stasis/dysmotility (Pyrtle)  . LAPAROSCOPIC APPENDECTOMY N/A 09/01/2015   Procedure: APPENDECTOMY LAPAROSCOPIC;  Surgeon: Judeth Horn, MD;  Location: [place];  Service: General;  Laterality: N/A;  . ORIF FEMUR FRACTURE Left 1996   Corrected by patient in history 2016  . PARTIAL HYSTERECTOMY  2000   paps by [facility] [place]  . TONSILLECTOMY AND ADENOIDECTOMY  1975  . TRIGGER FINGER RELEASE Left 12/2010   thumb    Current Outpatient Medications  Medication Sig Dispense Refill  . aspirin EC 81 MG tablet Take 1 tablet (81 mg total) by mouth daily.    Marland Kitchen esomeprazole (NEXIUM) 40 MG capsule Take 1 capsule (40 mg total) by mouth daily. Patient needs office visit for further refills 90 capsule 0  . glucagon (GLUCAGON EMERGENCY) 1 MG injection Inject 1 mg into the muscle once as needed (in case of severe hypoglycemia). 1 each 12  . Insulin Glargine (LANTUS SOLOSTAR) 100 UNIT/ML Solostar Pen Inject 12 Units into the skin daily. (Patient taking differently: Inject 18 Units into the skin daily. ) 5 pen 11  . Insulin Pen Needle (NOVOFINE) 32G X 6 MM MISC USE TO INJECT INSULIN 4 TIMES A DAY 200 each 1  . insulin regular (NOVOLIN R) 100 units/mL injection INJECT 6 UNITS TOTAL INTO THE SKIN 3 (THREE) TIMES DAILY BEFORE MEALS. 60 mL 1  . Insulin Syringe-Needle U-100 30G X 5/16"  0.3 ML MISC Use to inject insulin 6 times a day. 600 each 11  . Multiple Vitamin (MULTIVITAMIN) tablet Take 1 tablet by mouth daily.      . NON FORMULARY Focus Factor Takes 1 tablet twice daily.    . pravastatin (PRAVACHOL) 40 MG tablet Take 1 tablet (40 mg total) by mouth every Monday, Wednesday, and Friday.    . quinapril (ACCUPRIL) 5 MG  tablet Take 1 tablet (5 mg total) by mouth daily. 90 tablet 3  . diclofenac Sodium (VOLTAREN) 1 % GEL Apply 2 g topically 4 (four) times daily. (Patient not taking: Reported on 02/08/2020) 50 g 0   Current Facility-Administered Medications  Medication Dose Route Frequency Provider Last Rate Last Admin  . 0.9 %  sodium chloride infusion  500 mL Intravenous Continuous Pyrtle, Carie Caddy, MD         Allergies:   Protonix [pantoprazole], Toujeo solostar [insulin glargine], Lactose intolerance (gi), and Magnesium-containing compounds   Social History:  The patient  reports that she has never smoked. She has never used smokeless tobacco. She reports that she does not drink alcohol or use drugs.   Family History:   family history includes Breast cancer in an other family member; CAD (age of onset: 27) in her brother; COPD in her maternal grandmother; Colon cancer in her maternal grandfather; Colon polyps in her mother; Diabetes in her maternal grandmother; Heart failure in her maternal grandmother; Thyroid disease in her maternal grandmother; Vascular Disease in her maternal grandmother.    Review of Systems: Review of Systems  Constitutional: Negative.   HENT: Negative.   Respiratory: Negative.   Cardiovascular: Positive for chest pain.  Gastrointestinal: Negative.   Musculoskeletal: Negative.   Neurological: Negative.   Psychiatric/Behavioral: Negative.   All other systems reviewed and are negative.   PHYSICAL EXAM: VS:  BP 130/68 (BP Location: Right Arm, Patient Position: Sitting, Cuff Size: Normal)   Pulse 77   Ht 5\' 4"  (1.626 m)   Wt 138 lb 6 oz (62.8 kg)   SpO2 99%   BMI 23.75 kg/m  , BMI Body mass index is 23.75 kg/m. GEN: Well nourished, well developed, in no acute distress HEENT: normal Neck: no JVD, carotid bruits, or masses Cardiac: RRR; no murmurs, rubs, or gallops,no edema  Respiratory:  clear to auscultation bilaterally, normal work of breathing GI: soft, nontender,  nondistended, + BS MS: no deformity or atrophy Skin: warm and dry, no rash Neuro:  Strength and sensation are intact Psych: euthymic mood, full affect   Recent Labs: 09/23/2019: ALT 14 02/07/2020: BUN 19; Creatinine, Ser 0.99; Hemoglobin 13.1; Platelets 283; Potassium 4.5; Sodium 138    Lipid Panel Lab Results  Component Value Date   CHOL 196 09/23/2019   HDL 58.10 09/23/2019   LDLCALC 106 (H) 09/23/2019   TRIG 160.0 (H) 09/23/2019      Wt Readings from Last 3 Encounters:  02/08/20 138 lb 6 oz (62.8 kg)  02/07/20 138 lb 9 oz (62.9 kg)  01/13/20 137 lb 5 oz (62.3 kg)       ASSESSMENT AND PLAN:  Problem List Items Addressed This Visit      Cardiology Problems   Hyperlipidemia due to type 1 diabetes mellitus (HCC)   Essential hypertension     Other   Chest tightness - Primary   Type 1 diabetes mellitus with mild nonproliferative retinopathy of both eyes (HCC)     Unstable angina Several weeks of stuttering chest pain symptoms often with exertion  CT scan pulled up from 2016 showing significant coronary calcification of the RCA, the LAD and circumflex were not well-visualized -There is also aortic atherosclerosis of the descending aorta extending into the ostial/proximal iliac vessels -Cholesterol is above goal, long history of type 1 diabetes recent A1c 8-8.5 She has been battling with diabetes type 1 since age 14 Several risk factors for underlying coronary disease and ischemia -Long discussion with her concerning various treatment options for her chest pain symptoms concerning for angina -Stress testing discussed, cardiac CTA discussed, also discussed cardiac catheterization After further discussion, she prefers cardiac catheterization given the percent accuracy, ability to fix the blockage I have reviewed the risks, indications, and alternatives to cardiac catheterization, possible angioplasty, and stenting with the patient. Risks include but are not limited to  bleeding, infection, vascular injury, stroke, myocardial infection, arrhythmia, kidney injury, radiation-related injury in the case of prolonged fluoroscopy use, emergency cardiac surgery, and death. The patient understands the risks of serious complication is 1-2 in 1000 with diagnostic cardiac cath and 1-2% or less with angioplasty/stenting.  We will schedule catheterization for next week Recommend she present to the emergency room for worsening chest pain No medication changes made, nitro offered  Disposition:   F/U  1 month   Total encounter time more than 60 minutes  Greater than 50% was spent in counseling and coordination of care with the patient  Patient was seen in consultation for Dr. [name] will be referred back to his office for ongoing care of the issues detailed above  Signed, Dossie Arbour, M.D., Ph.D. [facility] [place], [place] [phone]

## 2020-02-08 NOTE — Patient Instructions (Addendum)
Medication Instructions:  No changes  If you need a refill on your cardiac medications before your next appointment, please call your pharmacy.    Lab work: No new labs needed   If you have labs (blood work) drawn today and your tests are completely normal, you will receive your results only by: Marland Kitchen MyChart Message (if you have MyChart) OR . A paper copy in the mail If you have any lab test that is abnormal or we need to change your treatment, we will call you to review the results.   Testing/Procedures: [facility] Cardiac Cath Instructions   You are scheduled for a Cardiac Cath on:_________________________  Please arrive at _______am on the day of your procedure  Please expect a call from our [facility] to pre-register you  Do not eat/drink anything after midnight  Someone will need to drive you home  It is recommended someone be with you for the first 24 hours after your procedure  Wear clothes that are easy to get on/off and wear slip on shoes if possible   Medications bring a current list of all medications with you  _XX__ You may take all of your medications the morning of your procedure with enough water to swallow safely  _XX__ Only take Lantus 4 units the night before and none the morning of. Hold all other diabetic medications.    Day of your procedure: Arrive at the [facility] entrance.  Free valet service is available.  After entering the [facility] please check-in at the registration desk (1st desk on your right) to receive your armband. After receiving your armband someone will escort you to the cardiac cath/special procedures waiting area.  The usual length of stay after your procedure is about 2 to 3 hours.  This can vary.  If you have any questions, please call our office at [phone], or you may call the cardiac cath lab at [facility] directly at [phone]    Follow-Up: At [facility], you and your health needs are our priority.   As part of our continuing mission to provide you with exceptional heart care, we have created designated Provider Care Teams.  These Care Teams include your primary Cardiologist (physician) and Advanced Practice Providers (APPs -  Physician Assistants and Nurse Practitioners) who all work together to provide you with the care you need, when you need it.  . You will need a follow up appointment in 1 month .   Marland Kitchen Providers on your designated Care Team:   . Nicolasa Ducking, NP . Eula Listen, PA-C . Marisue Ivan, PA-C  Any Other Special Instructions Will Be Listed Below (If Applicable).  For educational health videos Log in to : [email or web address] Or : [email or web address], password : [email or web address]

## 2020-02-09 ENCOUNTER — Telehealth: Payer: Self-pay | Admitting: Cardiovascular Disease

## 2020-02-09 NOTE — Telephone Encounter (Signed)
My Chart message sent with information for her testing and procedure. Requested that if she has any questions to let me know and I can call later.

## 2020-02-09 NOTE — Telephone Encounter (Signed)
Spoke with patient and reviewed if she had received message. She had several concerns about her upcoming procedure to include sedation, anxiety, IV placement, staying overnight, and if her husband could in fact stay while she is having this done. I answered each question and tried to provide emotional support and reassurance that she will be in good hands. She really wanted to know if intervention is needed could she still go home later that same day. She expressed concerns about her blood sugar management and the ability for them to assist if it becomes abnormal. She has had some previous experiences which really makes her concerned about this. I reassured her and she requested that I please send Dr. Mariah Milling this message to know of her wishes. Confirmed that I would most certainly update him and that she is in good hands.

## 2020-02-09 NOTE — Addendum Note (Signed)
Addended by: Thayer Headings, Kelin Nixon L on: 02/09/2020 01:37 PM   Modules accepted: Orders

## 2020-02-12 ENCOUNTER — Ambulatory Visit: Payer: BC Managed Care – PPO | Attending: Internal Medicine

## 2020-02-12 DIAGNOSIS — Z23 Encounter for immunization: Secondary | ICD-10-CM

## 2020-02-12 NOTE — Progress Notes (Signed)
   Covid-19 Vaccination Clinic  Name:  Felicia [name]    MRN: [id number] DOB: 14-Jun-1961  02/12/2020  [name] was observed post Covid-19 immunization for 15 minutes without incident. She was provided with Vaccine Information Sheet and instruction to access the V-Safe system.   [name] was instructed to call 911 with any severe reactions post vaccine: Marland Kitchen Difficulty breathing  . Swelling of face and throat  . A fast heartbeat  . A bad rash all over body  . Dizziness and weakness   Immunizations Administered    Name Date Dose VIS Date Route   Pfizer COVID-19 Vaccine 02/12/2020  8:54 AM 0.3 mL 10/15/2019 Intramuscular   Manufacturer: [employer], [employer]   Lot: [id number]   NDC: [address]-9

## 2020-02-15 ENCOUNTER — Other Ambulatory Visit
Admission: RE | Admit: 2020-02-15 | Discharge: 2020-02-15 | Disposition: A | Discharge status: Home / Self Care | Payer: BC Managed Care – PPO | Source: Ambulatory Visit | Attending: Cardiovascular Disease | Admitting: Cardiovascular Disease

## 2020-02-15 ENCOUNTER — Other Ambulatory Visit: Payer: Self-pay | Admitting: Family Medicine

## 2020-02-15 DIAGNOSIS — Z20822 Contact with and (suspected) exposure to covid-19: Secondary | ICD-10-CM | POA: Insufficient documentation

## 2020-02-15 DIAGNOSIS — Z01812 Encounter for preprocedural laboratory examination: Secondary | ICD-10-CM | POA: Diagnosis not present

## 2020-02-15 LAB — SARS CORONAVIRUS 2 (TAT 6-24 HRS): SARS Coronavirus 2: NEGATIVE

## 2020-02-16 ENCOUNTER — Other Ambulatory Visit: Payer: BC Managed Care – PPO

## 2020-02-16 ENCOUNTER — Other Ambulatory Visit: Payer: Self-pay | Admitting: Cardiovascular Disease

## 2020-02-16 DIAGNOSIS — I200 Unstable angina: Secondary | ICD-10-CM

## 2020-02-17 ENCOUNTER — Encounter: Payer: Self-pay | Admitting: Cardiovascular Disease

## 2020-02-17 ENCOUNTER — Telehealth: Payer: Self-pay

## 2020-02-17 ENCOUNTER — Inpatient Hospital Stay (HOSPITAL_COMMUNITY): Payer: BC Managed Care – PPO

## 2020-02-17 ENCOUNTER — Inpatient Hospital Stay (HOSPITAL_COMMUNITY)
Admission: AD | Admit: 2020-02-17 | Discharge: 2020-02-24 | DRG: 234 | Disposition: A | Discharge status: Home / Self Care | Payer: BC Managed Care – PPO | Source: Other Acute Inpatient Hospital | Attending: Thoracic Surgery (Cardiothoracic Vascular Surgery) | Admitting: Thoracic Surgery (Cardiothoracic Vascular Surgery)

## 2020-02-17 ENCOUNTER — Encounter (HOSPITAL_COMMUNITY): Payer: Self-pay | Admitting: Cardiovascular Disease

## 2020-02-17 ENCOUNTER — Encounter
Admission: RE | Disposition: A | Discharge status: Other Acute Inpatient Hospital | Payer: Self-pay | Source: Home / Self Care | Attending: Cardiovascular Disease

## 2020-02-17 ENCOUNTER — Ambulatory Visit
Admission: RE | Admit: 2020-02-17 | Discharge: 2020-02-17 | Disposition: A | Discharge status: Other Acute Inpatient Hospital | Payer: BC Managed Care – PPO | Attending: Cardiovascular Disease | Admitting: Cardiovascular Disease

## 2020-02-17 ENCOUNTER — Other Ambulatory Visit: Payer: Self-pay

## 2020-02-17 DIAGNOSIS — Z90711 Acquired absence of uterus with remaining cervical stump: Secondary | ICD-10-CM

## 2020-02-17 DIAGNOSIS — R55 Syncope and collapse: Secondary | ICD-10-CM | POA: Diagnosis not present

## 2020-02-17 DIAGNOSIS — I10 Essential (primary) hypertension: Secondary | ICD-10-CM | POA: Insufficient documentation

## 2020-02-17 DIAGNOSIS — R7989 Other specified abnormal findings of blood chemistry: Secondary | ICD-10-CM | POA: Diagnosis not present

## 2020-02-17 DIAGNOSIS — I25110 Atherosclerotic heart disease of native coronary artery with unstable angina pectoris: Secondary | ICD-10-CM | POA: Diagnosis not present

## 2020-02-17 DIAGNOSIS — Z01810 Encounter for preprocedural cardiovascular examination: Secondary | ICD-10-CM | POA: Diagnosis not present

## 2020-02-17 DIAGNOSIS — E1043 Type 1 diabetes mellitus with diabetic autonomic (poly)neuropathy: Secondary | ICD-10-CM | POA: Diagnosis not present

## 2020-02-17 DIAGNOSIS — Z951 Presence of aortocoronary bypass graft: Secondary | ICD-10-CM | POA: Diagnosis not present

## 2020-02-17 DIAGNOSIS — K589 Irritable bowel syndrome without diarrhea: Secondary | ICD-10-CM | POA: Diagnosis present

## 2020-02-17 DIAGNOSIS — I200 Unstable angina: Secondary | ICD-10-CM | POA: Diagnosis present

## 2020-02-17 DIAGNOSIS — Z79899 Other long term (current) drug therapy: Secondary | ICD-10-CM

## 2020-02-17 DIAGNOSIS — Z8249 Family history of ischemic heart disease and other diseases of the circulatory system: Secondary | ICD-10-CM

## 2020-02-17 DIAGNOSIS — Z20822 Contact with and (suspected) exposure to covid-19: Secondary | ICD-10-CM | POA: Diagnosis present

## 2020-02-17 DIAGNOSIS — R42 Dizziness and giddiness: Secondary | ICD-10-CM | POA: Diagnosis not present

## 2020-02-17 DIAGNOSIS — K3184 Gastroparesis: Secondary | ICD-10-CM | POA: Diagnosis not present

## 2020-02-17 DIAGNOSIS — J9811 Atelectasis: Secondary | ICD-10-CM | POA: Diagnosis not present

## 2020-02-17 DIAGNOSIS — Z888 Allergy status to other drugs, medicaments and biological substances status: Secondary | ICD-10-CM

## 2020-02-17 DIAGNOSIS — E10319 Type 1 diabetes mellitus with unspecified diabetic retinopathy without macular edema: Secondary | ICD-10-CM | POA: Diagnosis present

## 2020-02-17 DIAGNOSIS — S0285XA Fracture of orbit, unspecified, initial encounter for closed fracture: Secondary | ICD-10-CM | POA: Diagnosis not present

## 2020-02-17 DIAGNOSIS — Z833 Family history of diabetes mellitus: Secondary | ICD-10-CM

## 2020-02-17 DIAGNOSIS — E1069 Type 1 diabetes mellitus with other specified complication: Secondary | ICD-10-CM | POA: Diagnosis not present

## 2020-02-17 DIAGNOSIS — S299XXA Unspecified injury of thorax, initial encounter: Secondary | ICD-10-CM | POA: Diagnosis not present

## 2020-02-17 DIAGNOSIS — R0902 Hypoxemia: Secondary | ICD-10-CM | POA: Diagnosis not present

## 2020-02-17 DIAGNOSIS — I2510 Atherosclerotic heart disease of native coronary artery without angina pectoris: Secondary | ICD-10-CM | POA: Diagnosis not present

## 2020-02-17 DIAGNOSIS — R001 Bradycardia, unspecified: Secondary | ICD-10-CM | POA: Diagnosis not present

## 2020-02-17 DIAGNOSIS — E785 Hyperlipidemia, unspecified: Secondary | ICD-10-CM | POA: Insufficient documentation

## 2020-02-17 DIAGNOSIS — E1165 Type 2 diabetes mellitus with hyperglycemia: Secondary | ICD-10-CM | POA: Diagnosis not present

## 2020-02-17 DIAGNOSIS — K2100 Gastro-esophageal reflux disease with esophagitis, without bleeding: Secondary | ICD-10-CM | POA: Diagnosis present

## 2020-02-17 DIAGNOSIS — S52502G Unspecified fracture of the lower end of left radius, subsequent encounter for closed fracture with delayed healing: Secondary | ICD-10-CM | POA: Diagnosis not present

## 2020-02-17 DIAGNOSIS — S52612A Displaced fracture of left ulna styloid process, initial encounter for closed fracture: Secondary | ICD-10-CM | POA: Diagnosis not present

## 2020-02-17 DIAGNOSIS — K219 Gastro-esophageal reflux disease without esophagitis: Secondary | ICD-10-CM | POA: Diagnosis not present

## 2020-02-17 DIAGNOSIS — Z794 Long term (current) use of insulin: Secondary | ICD-10-CM

## 2020-02-17 DIAGNOSIS — S52602A Unspecified fracture of lower end of left ulna, initial encounter for closed fracture: Secondary | ICD-10-CM | POA: Diagnosis not present

## 2020-02-17 DIAGNOSIS — Z825 Family history of asthma and other chronic lower respiratory diseases: Secondary | ICD-10-CM | POA: Diagnosis not present

## 2020-02-17 DIAGNOSIS — G44309 Post-traumatic headache, unspecified, not intractable: Secondary | ICD-10-CM | POA: Diagnosis not present

## 2020-02-17 DIAGNOSIS — I214 Non-ST elevation (NSTEMI) myocardial infarction: Secondary | ICD-10-CM | POA: Diagnosis not present

## 2020-02-17 DIAGNOSIS — D62 Acute posthemorrhagic anemia: Secondary | ICD-10-CM | POA: Diagnosis not present

## 2020-02-17 DIAGNOSIS — Z7982 Long term (current) use of aspirin: Secondary | ICD-10-CM | POA: Diagnosis not present

## 2020-02-17 DIAGNOSIS — S52502A Unspecified fracture of the lower end of left radius, initial encounter for closed fracture: Secondary | ICD-10-CM | POA: Diagnosis not present

## 2020-02-17 DIAGNOSIS — I700 Atherosclerosis of aorta: Secondary | ICD-10-CM | POA: Diagnosis not present

## 2020-02-17 DIAGNOSIS — I340 Nonrheumatic mitral (valve) insufficiency: Secondary | ICD-10-CM

## 2020-02-17 DIAGNOSIS — Z800 Family history of malignant neoplasm of digestive organs: Secondary | ICD-10-CM

## 2020-02-17 DIAGNOSIS — Z01818 Encounter for other preprocedural examination: Secondary | ICD-10-CM

## 2020-02-17 DIAGNOSIS — J90 Pleural effusion, not elsewhere classified: Secondary | ICD-10-CM | POA: Diagnosis not present

## 2020-02-17 DIAGNOSIS — E108 Type 1 diabetes mellitus with unspecified complications: Secondary | ICD-10-CM

## 2020-02-17 DIAGNOSIS — Z09 Encounter for follow-up examination after completed treatment for conditions other than malignant neoplasm: Secondary | ICD-10-CM

## 2020-02-17 DIAGNOSIS — I25810 Atherosclerosis of coronary artery bypass graft(s) without angina pectoris: Secondary | ICD-10-CM | POA: Diagnosis not present

## 2020-02-17 DIAGNOSIS — E8770 Fluid overload, unspecified: Secondary | ICD-10-CM | POA: Diagnosis not present

## 2020-02-17 DIAGNOSIS — S0285XG Fracture of orbit, unspecified, subsequent encounter for fracture with delayed healing: Secondary | ICD-10-CM | POA: Diagnosis not present

## 2020-02-17 DIAGNOSIS — Z803 Family history of malignant neoplasm of breast: Secondary | ICD-10-CM | POA: Diagnosis not present

## 2020-02-17 DIAGNOSIS — E109 Type 1 diabetes mellitus without complications: Secondary | ICD-10-CM | POA: Diagnosis not present

## 2020-02-17 DIAGNOSIS — S52602G Unspecified fracture of lower end of left ulna, subsequent encounter for closed fracture with delayed healing: Secondary | ICD-10-CM | POA: Diagnosis not present

## 2020-02-17 HISTORY — DX: Atherosclerotic heart disease of native coronary artery without angina pectoris: I25.10

## 2020-02-17 HISTORY — DX: Personal history of other medical treatment: Z92.89

## 2020-02-17 HISTORY — PX: LEFT HEART CATH AND CORONARY ANGIOGRAPHY: CATH118249

## 2020-02-17 LAB — GLUCOSE, CAPILLARY
Glucose-Capillary: 218 mg/dL — ABNORMAL HIGH (ref 70–99)
Glucose-Capillary: 228 mg/dL — ABNORMAL HIGH (ref 70–99)
Glucose-Capillary: 175 mg/dL — ABNORMAL HIGH (ref 70–99)
Glucose-Capillary: 38 mg/dL — CL (ref 70–99)
Glucose-Capillary: 92 mg/dL (ref 70–99)

## 2020-02-17 LAB — CREATININE, SERUM
Creatinine, Ser: 0.98 mg/dL (ref 0.44–1.00)
GFR calc Af Amer: >60 mL/min (ref >60)
GFR calc non Af Amer: >60 mL/min (ref >60)

## 2020-02-17 LAB — HEMOGLOBIN A1C
Hgb A1c MFr Bld: 8.9 % — ABNORMAL HIGH (ref 4.8–5.6)
Mean Plasma Glucose: 208.73 mg/dL

## 2020-02-17 LAB — CBC
HCT: 38 % (ref 36.0–46.0)
Hemoglobin: 12.2 g/dL (ref 12.0–15.0)
MCH: 28.5 pg (ref 26.0–34.0)
MCHC: 32.1 g/dL (ref 30.0–36.0)
MCV: 88.8 fL (ref 80.0–100.0)
Platelets: 275 10*3/uL (ref 150–400)
RBC: 4.28 MIL/uL (ref 3.87–5.11)
RDW: 13.2 % (ref 11.5–15.5)
WBC: 5.9 10*3/uL (ref 4.0–10.5)
nRBC: 0 % (ref 0.0–0.2)

## 2020-02-17 LAB — ECHOCARDIOGRAM COMPLETE
Height: 64 in
Weight: 2176 oz

## 2020-02-17 SURGERY — LEFT HEART CATH AND CORONARY ANGIOGRAPHY
Anesthesia: Moderate Sedation

## 2020-02-17 MED ORDER — ONDANSETRON HCL 4 MG/2ML IJ SOLN
INTRAMUSCULAR | Status: AC
  Filled 2020-02-17: qty 2

## 2020-02-17 MED ORDER — ASPIRIN EC 81 MG PO TBEC: 81.0000 mg | DELAYED_RELEASE_TABLET | Freq: Every day | ORAL | Status: DC

## 2020-02-17 MED ORDER — HEPARIN (PORCINE) IN NACL 1000-0.9 UT/500ML-% IV SOLN
INTRAVENOUS | Status: AC
  Filled 2020-02-17: qty 1000

## 2020-02-17 MED ORDER — FENTANYL CITRATE (PF) 100 MCG/2ML IJ SOLN
INTRAMUSCULAR | Status: AC
  Filled 2020-02-17: qty 2

## 2020-02-17 MED ORDER — SODIUM CHLORIDE 0.9 % WEIGHT BASED INFUSION
3.0000 mL/kg/h | INTRAVENOUS | Status: AC
  Administered 2020-02-17: 3 mL/kg/h via INTRAVENOUS

## 2020-02-17 MED ORDER — SODIUM CHLORIDE 0.9 % IV SOLN: 250.0000 mL | INTRAVENOUS | Status: DC | PRN

## 2020-02-17 MED ORDER — HEPARIN SODIUM (PORCINE) 5000 UNIT/ML IJ SOLN
5000.0000 [IU] | Freq: Three times a day (TID) | INTRAMUSCULAR | Status: DC
  Administered 2020-02-17 – 2020-02-18 (×2): 5000 [IU] via SUBCUTANEOUS
  Filled 2020-02-17 (×2): qty 1

## 2020-02-17 MED ORDER — SODIUM CHLORIDE 0.9% FLUSH
10.0000 mL | Freq: Two times a day (BID) | INTRAVENOUS | Status: DC
  Administered 2020-02-17 – 2020-02-20 (×6): 10 mL

## 2020-02-17 MED ORDER — NON FORMULARY: 40.0000 mg | Freq: Every day | Status: DC

## 2020-02-17 MED ORDER — MIDAZOLAM HCL 2 MG/2ML IJ SOLN
INTRAMUSCULAR | Status: AC
  Filled 2020-02-17: qty 2

## 2020-02-17 MED ORDER — INSULIN GLARGINE 100 UNIT/ML SOLOSTAR PEN: 8.0000 [IU] | PEN_INJECTOR | SUBCUTANEOUS | Status: DC

## 2020-02-17 MED ORDER — NITROGLYCERIN 0.4 MG SL SUBL
0.4000 mg | SUBLINGUAL_TABLET | SUBLINGUAL | Status: DC | PRN
  Administered 2020-02-20 (×2): 0.4 mg via SUBLINGUAL
  Filled 2020-02-17: qty 1

## 2020-02-17 MED ORDER — HEPARIN (PORCINE) IN NACL 1000-0.9 UT/500ML-% IV SOLN
INTRAVENOUS | Status: DC | PRN
  Administered 2020-02-17: 500 mL

## 2020-02-17 MED ORDER — ASPIRIN 81 MG PO CHEW: 81.0000 mg | CHEWABLE_TABLET | ORAL | Status: DC

## 2020-02-17 MED ORDER — SODIUM CHLORIDE 0.9% FLUSH: 10.0000 mL | INTRAVENOUS | Status: DC | PRN

## 2020-02-17 MED ORDER — ONDANSETRON HCL 4 MG/2ML IJ SOLN
4.0000 mg | Freq: Four times a day (QID) | INTRAMUSCULAR | Status: DC | PRN
  Administered 2020-02-21: 04:00:00 4 mg via INTRAVENOUS
  Filled 2020-02-17: qty 2

## 2020-02-17 MED ORDER — INSULIN ASPART 100 UNIT/ML ~~LOC~~ SOLN
0.0000 [IU] | Freq: Three times a day (TID) | SUBCUTANEOUS | Status: DC
  Administered 2020-02-17 – 2020-02-18 (×3): 2 [IU] via SUBCUTANEOUS
  Administered 2020-02-18: 4 [IU] via SUBCUTANEOUS

## 2020-02-17 MED ORDER — INSULIN GLARGINE 100 UNIT/ML ~~LOC~~ SOLN
8.0000 [IU] | Freq: Every day | SUBCUTANEOUS | Status: DC
  Administered 2020-02-17: 22:00:00 4 [IU] via SUBCUTANEOUS
  Administered 2020-02-18: 22:00:00 8 [IU] via SUBCUTANEOUS
  Administered 2020-02-19: 22:00:00 6 [IU] via SUBCUTANEOUS
  Administered 2020-02-20: 23:00:00 4 [IU] via SUBCUTANEOUS
  Filled 2020-02-17 (×5): qty 0.08

## 2020-02-17 MED ORDER — FENTANYL CITRATE (PF) 100 MCG/2ML IJ SOLN
INTRAMUSCULAR | Status: DC | PRN
  Administered 2020-02-17: 25 ug via INTRAVENOUS
  Administered 2020-02-17: 50 ug via INTRAVENOUS

## 2020-02-17 MED ORDER — SODIUM CHLORIDE 0.9% FLUSH
3.0000 mL | Freq: Two times a day (BID) | INTRAVENOUS | Status: DC
  Administered 2020-02-17 – 2020-02-19 (×3): 3 mL via INTRAVENOUS

## 2020-02-17 MED ORDER — SODIUM CHLORIDE 0.9% FLUSH: 3.0000 mL | INTRAVENOUS | Status: DC | PRN

## 2020-02-17 MED ORDER — ADULT MULTIVITAMIN W/MINERALS CH
1.0000 | ORAL_TABLET | Freq: Every day | ORAL | Status: DC
  Administered 2020-02-18 – 2020-02-20 (×3): 1 via ORAL
  Filled 2020-02-17 (×4): qty 1

## 2020-02-17 MED ORDER — HYDRALAZINE HCL 20 MG/ML IJ SOLN
INTRAMUSCULAR | Status: AC
  Filled 2020-02-17: qty 1

## 2020-02-17 MED ORDER — PRAVASTATIN SODIUM 40 MG PO TABS
40.0000 mg | ORAL_TABLET | Freq: Every day | ORAL | Status: DC
  Administered 2020-02-19 – 2020-02-20 (×2): 40 mg via ORAL
  Filled 2020-02-17 (×2): qty 1

## 2020-02-17 MED ORDER — ACETAMINOPHEN 325 MG PO TABS
650.0000 mg | ORAL_TABLET | ORAL | Status: DC | PRN
  Administered 2020-02-20: 650 mg via ORAL
  Filled 2020-02-17: qty 2

## 2020-02-17 MED ORDER — SODIUM CHLORIDE 0.9 % WEIGHT BASED INFUSION
1.0000 mL/kg/h | INTRAVENOUS | Status: DC
  Administered 2020-02-17: 1 mL/kg/h via INTRAVENOUS

## 2020-02-17 MED ORDER — ONDANSETRON HCL 4 MG/2ML IJ SOLN
4.0000 mg | Freq: Once | INTRAMUSCULAR | Status: AC | PRN
  Administered 2020-02-17: 4 mg via INTRAVENOUS

## 2020-02-17 MED ORDER — MIDAZOLAM HCL 2 MG/2ML IJ SOLN
INTRAMUSCULAR | Status: DC | PRN
  Administered 2020-02-17 (×2): 1 mg via INTRAVENOUS

## 2020-02-17 MED ORDER — IOHEXOL 300 MG/ML  SOLN
INTRAMUSCULAR | Status: DC | PRN
  Administered 2020-02-17: 120 mL

## 2020-02-17 MED ORDER — EZETIMIBE 10 MG PO TABS
10.0000 mg | ORAL_TABLET | Freq: Every day | ORAL | Status: DC
  Administered 2020-02-18 – 2020-02-20 (×3): 10 mg via ORAL
  Filled 2020-02-17 (×4): qty 1

## 2020-02-17 MED ORDER — ASPIRIN EC 81 MG PO TBEC
81.0000 mg | DELAYED_RELEASE_TABLET | Freq: Every day | ORAL | Status: DC
  Administered 2020-02-18 – 2020-02-20 (×3): 81 mg via ORAL
  Filled 2020-02-17 (×3): qty 1

## 2020-02-17 MED ORDER — ESOMEPRAZOLE MAGNESIUM 20 MG PO CPDR
40.0000 mg | DELAYED_RELEASE_CAPSULE | Freq: Every day | ORAL | Status: DC
  Administered 2020-02-18 – 2020-02-20 (×3): 40 mg via ORAL
  Filled 2020-02-17 (×4): qty 2
  Filled 2020-02-17: qty 1

## 2020-02-17 MED ORDER — SODIUM CHLORIDE 0.9 % IV BOLUS
250.0000 mL | Freq: Once | INTRAVENOUS | Status: AC
  Administered 2020-02-17: 13:00:00 250 mL via INTRAVENOUS

## 2020-02-17 MED ORDER — HYDRALAZINE HCL 20 MG/ML IJ SOLN
INTRAMUSCULAR | Status: DC | PRN
  Administered 2020-02-17: 10 mg via INTRAVENOUS

## 2020-02-17 MED ORDER — INSULIN GLARGINE 100 UNIT/ML ~~LOC~~ SOLN
10.0000 [IU] | Freq: Every day | SUBCUTANEOUS | Status: DC
  Administered 2020-02-18: 6 [IU] via SUBCUTANEOUS
  Administered 2020-02-19 – 2020-02-20 (×2): 10 [IU] via SUBCUTANEOUS
  Filled 2020-02-17 (×4): qty 0.1

## 2020-02-17 MED ORDER — SODIUM CHLORIDE 0.9 % IV BOLUS
250.0000 mL | Freq: Once | INTRAVENOUS | Status: AC
  Administered 2020-02-17: 12:00:00 250 mL via INTRAVENOUS

## 2020-02-17 SURGICAL SUPPLY — 10 items
CATH INFINITI 5FR ANG PIGTAIL (CATHETERS) ×1 IMPLANT
CATH INFINITI 5FR JL4 (CATHETERS) ×1 IMPLANT
CATH INFINITI JR4 5F (CATHETERS) ×1 IMPLANT
DEVICE CLOSURE MYNXGRIP 5F (Vascular Products) ×1 IMPLANT
KIT MANI 3VAL PERCEP (MISCELLANEOUS) ×2 IMPLANT
NDL PERC 18GX7CM (NEEDLE) IMPLANT
NEEDLE PERC 18GX7CM (NEEDLE) ×2 IMPLANT
PACK CARDIAC CATH (CUSTOM PROCEDURE TRAY) ×2 IMPLANT
SHEATH AVANTI 5FR X 11CM (SHEATH) ×1 IMPLANT
WIRE GUIDERIGHT .035X150 (WIRE) ×1 IMPLANT

## 2020-02-17 NOTE — Progress Notes (Signed)
  Echocardiogram 2D Echocardiogram has been performed.  Delcie Roch 02/17/2020, 5:48 PM

## 2020-02-17 NOTE — Telephone Encounter (Signed)
Patients husband called to report that Pamlea is in the hospital in [facility] and being transferred to [facility] today-patient may be having stent or bypass surgery due to blockage and her husband is afraid they will mess up her insulin dosages as has happened in past stays at the hospital-he would like to make Dr. Elvera Lennox to be aware that this is going on so she can advise on any change in medication-I did advise her husband to contact us if he is having any issues with her medication management at the hospital and he has agreed to this

## 2020-02-17 NOTE — H&P (Signed)
History & Physical    Patient ID: Felicia [name] MRN: [id number], DOB/AGE: 57-Dec-1962   Admit date: 02/17/2020  Primary Physician: [name], MD Primary Cardiologist: Julien Nordmann, MD  Patient Profile    59 year old female with a history of type 1 diabetes, hypertension, hyperlipidemia, gastroparesis/irritable bowel syndrome, coronary and aortic atherosclerosis previously seen on CT, and myelopathy, who was recently evaluated in outpatient secondary unstable angina underwent diagnostic catheterization April 15 revealing moderate to severe multivessel disease prompting transfer to [facility] for further CT surgical and interventional evaluation.  Past Medical History    Past Medical History:  Diagnosis Date  . Allergy   . Anemia    past history long ago  . Borderline HTN (hypertension)   . CAD (coronary artery disease)    a. 02/2020 Cath: LM nl, LAD large, 90p, 94m, D1 90p, LCX nl, OM 60-70p, RCA dominant w/ dampening upon engagement of ostium. 70p/m, RPDA 50. EF 55-65%.  . Disorder of kidney    has abd kidney (right)  . GERD (gastroesophageal reflux disease)   . History of diabetic gastroparesis   . History of echocardiogram    a. 01/2013 Echo: EF 55-65%, no rwma. Mild MR. Nl RV fxn.  Marland Kitchen HLD (hyperlipidemia)   . Hx: UTI (urinary tract infection)   . IBS (irritable bowel syndrome)   . Nephrolithiasis 2005  . Osteopenia 2012   per pt  . Systolic murmur 01/2013   mild mitral insuff  . Type 1 diabetes mellitus with diabetic retinopathy without macular edema (HCC)    dx at 59 y/o, background retinopathy    Past Surgical History:  Procedure Laterality Date  . CESAREAN SECTION  1985  . COLONOSCOPY  07/2016   WNL (Pyrtle)  . ESOPHAGOGASTRODUODENOSCOPY  07/2016   LA Grade C reflux esophagitis, concern for stasis/dysmotility (Pyrtle)  . LAPAROSCOPIC APPENDECTOMY N/A 09/01/2015   Procedure: APPENDECTOMY LAPAROSCOPIC;  Surgeon: Jimmye Norman, MD;  Location: [facility] OR;  Service:  General;  Laterality: N/A;  . ORIF FEMUR FRACTURE Left 1996   Corrected by patient in history 2016  . PARTIAL HYSTERECTOMY  2000   paps by [facility]  . TONSILLECTOMY AND ADENOIDECTOMY  1975  . TRIGGER FINGER RELEASE Left 12/2010   thumb     Allergies  Allergies  Allergen Reactions  . Protonix [Pantoprazole] Swelling    Facial Swelling  . Toujeo Solostar [Insulin Glargine] Shortness Of Breath  . Lactose Intolerance (Gi)   . Magnesium-Containing Compounds Swelling    History of Present Illness    59 year old female with the above past medical history including type 1 diabetes mellitus, hypertension, hyperlipidemia, gastroparesis, irritable bowel syndrome, myelopathy, and coronary and aortic atherosclerosis previously seen on CT. she was recently evaluated on April 5 in the emergency department secondary to a several week history of stuttering chest pain.  ECG was nonacute and cardiac markers were negative.  She was treated discharged home and follow-up with Dr. Mariah Milling on April 6, where she described fairly typical anginal symptoms with exertional onset and resolution with rest.  Decision was made to pursue diagnostic catheterization which was performed earlier this morning, April 15, revealing severe proximal LAD disease as well as severe diagonal, and moderate obtuse marginal and RCA disease.  EF is 55 to 65% by ventriculography.  Films were reviewed by interventional cardiology and recommendation was was made for CT surgical evaluation versus possible atherectomy of the LAD.  As result, she is being transferred to [facility] today.  Home  Medications    Prior to Admission medications   Medication Sig Start Date End Date Taking? Authorizing Provider  aspirin EC 81 MG tablet Take 1 tablet (81 mg total) by mouth daily. 02/07/20   [name], MD  esomeprazole (NEXIUM) 40 MG capsule Take 1 capsule (40 mg total) by mouth daily. Patient needs office visit for further refills  04/26/19   Pyrtle, Carie Caddy, MD  glucagon (GLUCAGON EMERGENCY) 1 MG injection Inject 1 mg into the muscle once as needed (in case of severe hypoglycemia). 09/04/18   Carlus Pavlov, MD  Insulin Glargine (LANTUS SOLOSTAR) 100 UNIT/ML Solostar Pen Inject 12 Units into the skin daily. Patient taking differently: Inject 8-10 Units into the skin See admin instructions. 10 units in the morning, 8 units in the evening 10/25/19   Carlus Pavlov, MD  Insulin Pen Needle (NOVOFINE) 32G X 6 MM MISC USE TO INJECT INSULIN 4 TIMES A DAY 05/24/19   Carlus Pavlov, MD  insulin regular (NOVOLIN R) 100 units/mL injection INJECT 6 UNITS TOTAL INTO THE SKIN 3 (THREE) TIMES DAILY BEFORE MEALS. Patient taking differently: Inject 1-10 Units into the skin See admin instructions. INJECT 1-10 UNITS TOTAL INTO THE SKIN 3 (THREE) TIMES DAILY BEFORE MEALS. PER SLIDING SCALE 08/06/19   Carlus Pavlov, MD  Insulin Syringe-Needle U-100 30G X 5/16" 0.3 ML MISC Use to inject insulin 6 times a day. 01/07/20   Carlus Pavlov, MD  Multiple Vitamin (MULTIVITAMIN) tablet Take 1 tablet by mouth daily.      [provider]  NON FORMULARY Focus Factor Takes 1 tablet twice daily.    [provider]  pravastatin (PRAVACHOL) 40 MG tablet TAKE 1.5 TABLETS (60 MG TOTAL) BY MOUTH DAILY. 02/15/20   [name], MD  quinapril (ACCUPRIL) 5 MG tablet Take 1 tablet (5 mg total) by mouth daily. 06/28/19   [name], MD    Family History    Family History  Problem Relation Age of Onset  . Diabetes Maternal Grandmother   . Heart failure Maternal Grandmother   . COPD Maternal Grandmother        smoker  . Thyroid disease Maternal Grandmother   . Vascular Disease Maternal Grandmother   . Colon cancer Maternal Grandfather   . Colon polyps Mother   . Breast cancer Other   . CAD Brother 52       stent  . Esophageal cancer Neg Hx   . Rectal cancer Neg Hx   . Stomach cancer Neg Hx    She indicated that her  mother is alive. She indicated that her father is deceased. She indicated that her brother is alive. She indicated that her maternal grandmother is deceased. She indicated that the status of her maternal grandfather is unknown. She indicated that the status of her neg hx is unknown. She indicated that the status of her other is unknown.   Social History    Social History   Socioeconomic History  . Marital status: Married    Spouse name: Not on file  . Number of children: 1  . Years of education: 47  . Highest education level: Bachelor's degree (e.g., BA, AB, BS)  Occupational History  . Occupation: HR Chemical engineer: OTHER    Comment: Scientist, research (medical)  Tobacco Use  . Smoking status: Never Smoker  . Smokeless tobacco: Never Used  Substance and Sexual Activity  . Alcohol use: No    Alcohol/week: 0.0 standard drinks  . Drug use: No  .  Sexual activity: Not on file  Other Topics Concern  . 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  Has one daughter.  Education: college.      1 dog   Social Determinants of Radio broadcast assistant Strain:   . Difficulty of Paying Living Expenses:   Food Insecurity:   . Worried About Charity fundraiser in the Last Year:   . Arboriculturist in the Last Year:   Transportation Needs:   . Film/video editor (Medical):   Marland Kitchen Lack of Transportation (Non-Medical):   Physical Activity:   . Days of Exercise per Week:   . Minutes of Exercise per Session:   Stress:   . Feeling of Stress :   Social Connections:   . Frequency of Communication with Friends and Family:   . Frequency of Social Gatherings with Friends and Family:   . Attends Religious Services:   . Active Member of Clubs or Organizations:   . Attends Archivist Meetings:   Marland Kitchen Marital Status:   Intimate Partner Violence:   . Fear of Current or Ex-Partner:   . Emotionally  Abused:   Marland Kitchen Physically Abused:   . Sexually Abused:      Review of Systems    General:  No chills, fever, night sweats or weight changes.  Cardiovascular:  +++ ex chest pain, +++ dyspnea on exertion, no edema, orthopnea, palpitations, paroxysmal nocturnal dyspnea. Dermatological: No rash, lesions/masses Respiratory: No cough, +++ exertional dyspnea Urologic: No hematuria, dysuria Abdominal:   No nausea, vomiting, diarrhea, bright red blood per rectum, melena, or hematemesis Neurologic:  No visual changes, wkns, changes in mental status. All other systems reviewed and are otherwise negative except as noted above.  Physical Exam    T 98.2, HR 74, Resp 13, 90/50, 99% on room air General: Pleasant, NAD Psych: Normal affect. Neuro: Alert and oriented X 3. Moves all extremities spontaneously. HEENT: Normal  Neck: Supple without bruits or JVD. Lungs:  Resp regular and unlabored, CTA. Heart: RRR no s3, s4, or murmurs. Abdomen: Soft, non-tender, non-distended, BS + x 4.  Extremities: No clubbing, cyanosis or edema. DP/PT/Radials 2+ and equal bilaterally. R groin cath site w/o bleeding/bruit/hematoma.  Labs    Cardiac Enzymes Recent Labs  Lab 02/07/20 1134 02/07/20 1405  TROPONINIHS 3 3      Lab Results  Component Value Date   WBC 5.1 02/07/2020   HGB 13.1 02/07/2020   HCT 41.3 02/07/2020   MCV 91.0 02/07/2020   PLT 283 02/07/2020    Lab Results  Component Value Date   CHOL 196 09/23/2019   HDL 58.10 09/23/2019   LDLCALC 106 (H) 09/23/2019   TRIG 160.0 (H) 09/23/2019    Radiology Studies    DG Chest 2 View  Result Date: 02/07/2020 CLINICAL DATA:  Chest pain EXAM: CHEST - 2 VIEW COMPARISON:  April 21, 2015 FINDINGS: The heart size and mediastinal contours are within normal limits. Both lungs are clear. The visualized skeletal structures are unremarkable. IMPRESSION: No active cardiopulmonary disease. Electronically Signed   By: Prudencio Pair M.D.   On: 02/07/2020 11:46    ECG & Cardiac Imaging    Regular sinus rhythm, 80, no acute ST or T changes- personally reviewed.  Assessment & Plan    1.  Unstable angina/coronary artery disease: Patient was recently evaluated  secondary to a several week history of exertional angina relieved with rest.  She underwent diagnostic catheterization this morning, April 15, revealing severe LAD and diagonal disease with moderate to severe obtuse marginal and RCA.  Films reviewed by interventional cardiology with recommendation for CT surgical evaluation versus LAD atherectomy.  Patient will be transferred to [facility] for further evaluation.  Continue aspirin and statin therapy.  Of note, she had prior intolerances to higher potency statins but does seem to tolerate Pravachol.  2.  Essential hypertension: Blood pressure has been soft post catheterization.  We will hold home dose of lisinopril at this time.  3.  Hyperlipidemia: Has tried multiple statins but thus far, has only tolerated Pravachol.  Continue Pravachol and will add Zetia.  4.  Type 1 diabetes mellitus: Continue home insulin therapy with sliding scale.  Signed, Nicolasa Ducking, NP 02/17/2020, 12:32 PM

## 2020-02-17 NOTE — Telephone Encounter (Signed)
I am sorry to hear that!  Unfortunately, I would not be able to make changes in her regimen but she is in the hospital..Marland Kitchen

## 2020-02-17 NOTE — Consult Note (Addendum)
[address]       [place] [address]             [phone]        MADALYNN [name] [facility] Medical Record #[id number] Date of Birth: 1961/01/17  Referring: Dr. Mariah Milling, MD Primary Care: [name], MD Primary Cardiologist:[name], MD  Chief Complaint:  Unstable angina Reason for consultation: Multivessel coronary artery disease   History of Present Illness:     This is a 59 year old female with a past medical history of hypertension, hyperlipidemia, diabetes mellitus (type I), diabetic gastroparesis, IBS, GERD, and family history of heart disease in first degree relatives who states she started exercising and lifting weights in order to purposefully lose weight. She would have intermittent episodes, both at rest and with exertion, of a tightness on the left side of her right breast that does not radiate and worsens with stress. This has been occurring for over 2 weeks. She denies nausea, shortness of breath, diaphoresis, palpitations, or syncope. Initially, she thought the chest tightness was related to lifting weights. After Easter, she was still having episodes of chest tightness so she saw her medical doctor, Dr. [name]. EKG show NSR and Q waves unchanged from EKG in 2016. She was hypertensive, given Nitro with improvement, and Dr. [name] was concerned about possible unstable angina. It was arranged for patient to go to [facility] ED on 02/07/2020 for further evaluation. EKG  Showed SR, septal infarct (age undetermined) and Troponin I (high sensitivity) was 3. She was discharged home and a follow up appointment was made quickly with Dr. Mariah Milling. Patient reports that Dr. Mariah Milling said on CT scan that was done in 2016 there were multiple areas of calcium and a cardiac catheterization was recommended. She had a cardiac catheterization done via femoral artery at [facility]. Results showed LVEF 65%, proximal to mid LAD 90% stenosed, Diagonal 1 95%  stenosed, Diagonal 2 70% stenosed, ostial to proximal and mid RCA 70% stenosed. A cardiothoracic consultation has been requested with Dr. [name]. At the time of my examination, patient was in sinus rhythm and vital signs were stable. She denied chest tightness.  Current Activity/ Functional Status: Patient is independent with mobility/ambulation, transfers, ADL's, IADL's.   Zubrod Score: At the time of surgery this patient's most appropriate activity status/level should be described as: []     0    Normal activity, no symptoms [x]     1    Restricted in physical strenuous activity but ambulatory, able to do out light work []     2    Ambulatory and capable of self care, unable to do work activities, up and about more than 50%  Of the time                            []     3    Only limited self care, in bed greater than 50% of waking hours []     4    Completely disabled, no self care, confined to bed or chair []     5    Moribund  Past Medical History:  Diagnosis Date  . Allergy   . Anemia    past history long ago  . Borderline HTN (hypertension)   . CAD (coronary artery disease)    a. 02/2020 Cath: LM nl, LAD large, 90p, 63m, D1 90p, LCX nl, OM 60-70p, RCA dominant w/ dampening  upon engagement of ostium. 70p/m, RPDA 50. EF 55-65%.  . Disorder of kidney    has abd kidney (right) (functions normally)  . GERD (gastroesophageal reflux disease)   . History of diabetic gastroparesis   . History of echocardiogram    a. 01/2013 Echo: EF 55-65%, no rwma. Mild MR. Nl RV fxn.  Marland Kitchen HLD (hyperlipidemia)   . Hx: UTI (urinary tract infection)   . IBS (irritable bowel syndrome)   . Nephrolithiasis 2005  . Osteopenia 2012   per pt  . Systolic murmur 01/2013   mild mitral insuff  . Type 1 diabetes mellitus with diabetic retinopathy without macular edema (HCC)    dx at 59 y/o, background retinopathy    Past Surgical History:  Procedure Laterality Date  . CESAREAN SECTION  1985  . COLONOSCOPY   07/2016   WNL (Pyrtle)  . ESOPHAGOGASTRODUODENOSCOPY  07/2016   LA Grade C reflux esophagitis, concern for stasis/dysmotility (Pyrtle)  . LAPAROSCOPIC APPENDECTOMY N/A 09/01/2015   Procedure: APPENDECTOMY LAPAROSCOPIC;  Surgeon: Jimmye Norman, MD;  Location: [facility] OR;  Service: General;  Laterality: N/A;  . LEFT HEART CATH AND CORONARY ANGIOGRAPHY N/A 02/17/2020   Procedure: LEFT HEART CATH AND CORONARY ANGIOGRAPHY;  Surgeon: Antonieta Iba, MD;  Location: [facility] INVASIVE CV LAB;  Service: Cardiovascular;  Laterality: N/A;  . ORIF FEMUR FRACTURE Left 1996   Corrected by patient in history 2016  . PARTIAL HYSTERECTOMY  2000   paps by [facility] [place]  . TONSILLECTOMY AND ADENOIDECTOMY  1975  . TRIGGER FINGER RELEASE Left 12/2010   thumb    Social History   Tobacco Use  Smoking Status Never Smoker  Smokeless Tobacco Never Used    Social History   Substance and Sexual Activity  Alcohol Use No  . Alcohol/week: 0.0 standard drinks     Allergies  Allergen Reactions  . Protonix [Pantoprazole] Swelling    Facial Swelling  . Toujeo Solostar [Insulin Glargine] Shortness Of Breath  . Lactose Intolerance (Gi)   . Magnesium-Containing Compounds Swelling    No current facility-administered medications for this encounter.    Facility-Administered Medications Prior to Admission  Medication Dose Route Frequency Provider Last Rate Last Admin  . 0.9 %  sodium chloride infusion  500 mL Intravenous Continuous Pyrtle, Carie Caddy, MD       Medications Prior to Admission  Medication Sig Dispense Refill Last Dose  . aspirin EC 81 MG tablet Take 1 tablet (81 mg total) by mouth daily.     Marland Kitchen esomeprazole (NEXIUM) 40 MG capsule Take 1 capsule (40 mg total) by mouth daily. Patient needs office visit for further refills 90 capsule 0   . glucagon (GLUCAGON EMERGENCY) 1 MG injection Inject 1 mg into the muscle once as needed (in case of severe hypoglycemia). 1 each 12   . Insulin Glargine (LANTUS  SOLOSTAR) 100 UNIT/ML Solostar Pen Inject 12 Units into the skin daily. (Patient taking differently: Inject 8-10 Units into the skin See admin instructions. 10 units in the morning, 8 units in the evening) 5 pen 11   . Insulin Pen Needle (NOVOFINE) 32G X 6 MM MISC USE TO INJECT INSULIN 4 TIMES A DAY 200 each 1   . insulin regular (NOVOLIN R) 100 units/mL injection INJECT 6 UNITS TOTAL INTO THE SKIN 3 (THREE) TIMES DAILY BEFORE MEALS. (Patient taking differently: Inject 1-10 Units into the skin See admin instructions. INJECT 1-10 UNITS TOTAL INTO THE SKIN 3 (THREE) TIMES DAILY BEFORE MEALS. PER SLIDING SCALE)  60 mL 1   . Insulin Syringe-Needle U-100 30G X 5/16" 0.3 ML MISC Use to inject insulin 6 times a day. 600 each 11   . Multiple Vitamin (MULTIVITAMIN) tablet Take 1 tablet by mouth daily.       . NON FORMULARY Focus Factor Takes 1 tablet twice daily.     . pravastatin (PRAVACHOL) 40 MG tablet TAKE 1.5 TABLETS (60 MG TOTAL) BY MOUTH DAILY. 45 tablet 0   . quinapril (ACCUPRIL) 5 MG tablet Take 1 tablet (5 mg total) by mouth daily. 90 tablet 3     Family History  Problem Relation Age of Onset  . Diabetes Maternal Grandmother   . Heart failure Maternal Grandmother   . COPD Maternal Grandmother        smoker  . Thyroid disease Maternal Grandmother   . Vascular Disease Maternal Grandmother   . Colon cancer Maternal Grandfather   . Colon polyps Mother   . Breast cancer Other   . CAD Brother 52       stent  . Esophageal cancer Neg Hx   . Rectal cancer Neg Hx   . Stomach cancer Neg Hx    Review of Systems:    Cardiac Review of Systems: Y or  [ N   ]= no  Chest Pain [  N  ]  Resting SOB Klaus.Mock   ] Exertional SOB  Klaus.Mock  ]  Orthopnea [ N ]   Pedal Edema [ N  ]    Palpitations Klaus.Mock  ] Syncope  Klaus.Mock  ]   Presyncope [ N  ]  General Review of Systems: [Y] = yes [ N ]=no Constitional: recent weight change [ Y ];   nausea [ N ]; night sweats [ N ]; fever [ N ]; or chills [ N ]                                                                 Eye : blurred vision [ N ];  Amaurosis fugax[N  ]; Resp: cough [ N ];  wheezing[N  ];  hemoptysis[  N];  GI:  vomiting[ N ];  ; melena[ N ];  hematochezia Klaus.Mock  ]; heartburn[ Y ];    YH:CWCBJSEGB[ N ];                Skin: rash, swelling[ N ];, peripheral edema[ N ];   Musculosketetal: myalgias[Y-statins and now Pravachol    Heme/Lymph:  anemia[ Not at this time but has a history];  Neuro: Deyanira.Kussmaul  ];  headaches[  ];  stroke[N  ];  vertigo[N  ];  seizures[ N ];     Endocrine: diabetes[ Y ];  thyroid dysfunction[ N ];             Vital Signs: Temp 98.1, HR 82, RR 14, BP 118/66, Oxygenation 98% on room air.  Physical Exam:  General appearance: alert, cooperative and no distress Head: Normocephalic, without obvious abnormality, atraumatic Neck: no carotid bruit, no JVD and supple, symmetrical, trachea midline Resp: clear to auscultation bilaterally Cardio: RRR, no murmur GI: Soft, non tender, bowel sounds present Extremities: No LE edema;palpable DP/PT  bilaterally Neurologic: Grossly normal  Diagnostic Studies & Laboratory data:     Recent Radiology Findings:  Coronary Findings  Diagnostic Dominance: Right Left Anterior Descending  Prox LAD to Mid LAD lesion 90% stenosed  Prox LAD to Mid LAD lesion is 90% stenosed.  Mid LAD-1 lesion 80% stenosed  Mid LAD-1 lesion is 80% stenosed.  Mid LAD-2 lesion 50% stenosed  Mid LAD-2 lesion is 50% stenosed.  First Diagonal Branch  1st Diag-1 lesion 95% stenosed  1st Diag-1 lesion is 95% stenosed.  1st Diag-2 lesion 70% stenosed  1st Diag-2 lesion is 70% stenosed.  Left Circumflex  First Obtuse Marginal Branch  1st Mrg lesion 60% stenosed  1st Mrg lesion is 60% stenosed.  Right Coronary Artery  Ost RCA to Prox RCA lesion 70% stenosed  Ost RCA to Prox RCA lesion is 70% stenosed.  Mid RCA lesion 70% stenosed  Mid RCA lesion is 70% stenosed.  Right Posterior Descending Artery  RPDA lesion 50% stenosed    RPDA lesion is 50% stenosed.  Intervention  No interventions have been documented. Wall Motion  Resting               Left Heart  Left Ventricle The left ventricular size is normal. The left ventricular systolic function is normal. LV end diastolic pressure is normal. The left ventricular ejection fraction is greater than 65% by visual estimate. No regional wall motion abnormalities. There is no evidence of mitral regurgitation.  Coronary Diagrams  Diagnostic Dominance: Right    CARDIAC CATHETERIZATION  Result Date: 02/17/2020  1st Mrg lesion is 60% stenosed.  Prox LAD to Mid LAD lesion is 90% stenosed.  Mid LAD-1 lesion is 80% stenosed.  Mid LAD-2 lesion is 50% stenosed.  1st Diag-1 lesion is 95% stenosed.  1st Diag-2 lesion is 70% stenosed.  Ost RCA to Prox RCA lesion is 70% stenosed.  Mid RCA lesion is 70% stenosed.  RPDA lesion is 50% stenosed.  The left ventricular ejection fraction is greater than 65% by visual estimate.  LV end diastolic pressure is normal.  The left ventricular systolic function is normal.  There is no mitral valve regurgitation.      I have independently reviewed the above radiologic studies and discussed with the patient   Recent Lab Findings: Lab Results  Component Value Date   WBC 5.1 02/07/2020   HGB 13.1 02/07/2020   HCT 41.3 02/07/2020   PLT 283 02/07/2020   GLUCOSE 224 (H) 02/07/2020   CHOL 196 09/23/2019   TRIG 160.0 (H) 09/23/2019   HDL 58.10 09/23/2019   LDLDIRECT 120.0 06/17/2019   LDLCALC 106 (H) 09/23/2019   ALT 14 09/23/2019   AST 19 09/23/2019   NA 138 02/07/2020   K 4.5 02/07/2020   CL 104 02/07/2020   CREATININE 0.99 02/07/2020   BUN 19 02/07/2020   CO2 23 02/07/2020   TSH 2.21 09/17/2017   HGBA1C 8.5 (H) 09/23/2019    Assessment / Plan:   1. Unstable angina, coronary artery disease-Cardiology to start Heparin drip. Dr. Kipp Brood to evaluate to determine if coronary artery bypass grafting surgery would be  best option vs PCI. May need to further evaluate aorta to determine degree of atherosclerosis seen on CT scan 2016. Echo ordered so will await results to determine if any valvular abnormalities 2. History of diabetes mellitus type I-PTA, on Insulin Glargine 18 units daily (10 units in am and 8 units in pm) , Insulin regular 6 units tid before meals. Of note, patient very concerned about diabetes management and would like to remain on the sliding scale she uses at  home.  Await HGA1C 3. History of hypertension-PTA, on Quinapril 5 mg daily 4. History of hyperlipidemia-PTA, Pravastatin 60 mg daily and there was a discussion about starting Zetia. Of note, patient states Pravachol is giving her myalgias now and she does not want to take it. Could consider PCSK9 at some point but will defer to cardiology 5. History of GERD-PTA, on Nexium 40 mg daily    I  spent 25 minutes counseling the patient face to face.   Doree FudgeDonielle Zimmerman PA-C 02/17/2020 4:02 PM    Felicia [name] is a 59 year old female with history of type 1 diabetes who was transferred from [facility] with unstable angina, and a left heart cath which shows three-vessel coronary disease.  She does report some anginal symptoms that began around [place] of this year but prior to this she was essentially asymptomatic.  On review of her images she has tandem lesions in the proximal LAD, but the distal target is good.  She is also got diagonal disease and circumflex disease, as well as a tight stenosis in the RCA.  On echocardiogram she has preserved LV and RV function, and no significant valvular disease.  In regards to her diabetes, her hemoglobin A1c was 8.9.  The risk benefits and alternatives to proceed with surgical revascularization was discussed with her, and she is agreeable to proceed.  She is tentatively scheduled for 02/21/2020 for CABG x4.  Almas Rake Keane Scrape Alynah Schone

## 2020-02-18 ENCOUNTER — Inpatient Hospital Stay (HOSPITAL_COMMUNITY): Payer: BC Managed Care – PPO

## 2020-02-18 ENCOUNTER — Telehealth: Payer: Self-pay | Admitting: Cardiovascular Disease

## 2020-02-18 ENCOUNTER — Encounter (HOSPITAL_COMMUNITY): Payer: Self-pay | Admitting: Cardiovascular Disease

## 2020-02-18 ENCOUNTER — Other Ambulatory Visit (HOSPITAL_COMMUNITY): Payer: BC Managed Care – PPO

## 2020-02-18 DIAGNOSIS — Z01810 Encounter for preprocedural cardiovascular examination: Secondary | ICD-10-CM | POA: Diagnosis not present

## 2020-02-18 DIAGNOSIS — I200 Unstable angina: Secondary | ICD-10-CM | POA: Diagnosis not present

## 2020-02-18 LAB — BASIC METABOLIC PANEL
Anion gap: 9 (ref 5–15)
BUN: 18 mg/dL (ref 6–20)
CO2: 25 mmol/L (ref 22–32)
Calcium: 9 mg/dL (ref 8.9–10.3)
Chloride: 103 mmol/L (ref 98–111)
Creatinine, Ser: 0.91 mg/dL (ref 0.44–1.00)
GFR calc Af Amer: >60 mL/min (ref >60)
GFR calc non Af Amer: >60 mL/min (ref >60)
Glucose, Bld: 269 mg/dL — ABNORMAL HIGH (ref 70–99)
Potassium: 4.3 mmol/L (ref 3.5–5.1)
Sodium: 137 mmol/L (ref 135–145)

## 2020-02-18 LAB — CBC
HCT: 38.6 % (ref 36.0–46.0)
Hemoglobin: 12.6 g/dL (ref 12.0–15.0)
MCH: 29.1 pg (ref 26.0–34.0)
MCHC: 32.6 g/dL (ref 30.0–36.0)
MCV: 89.1 fL (ref 80.0–100.0)
Platelets: 260 10*3/uL (ref 150–400)
RBC: 4.33 MIL/uL (ref 3.87–5.11)
RDW: 13.2 % (ref 11.5–15.5)
WBC: 5.5 10*3/uL (ref 4.0–10.5)
nRBC: 0 % (ref 0.0–0.2)

## 2020-02-18 LAB — GLUCOSE, CAPILLARY
Glucose-Capillary: 224 mg/dL — ABNORMAL HIGH (ref 70–99)
Glucose-Capillary: 245 mg/dL — ABNORMAL HIGH (ref 70–99)
Glucose-Capillary: 84 mg/dL (ref 70–99)
Glucose-Capillary: 122 mg/dL — ABNORMAL HIGH (ref 70–99)

## 2020-02-18 LAB — HEPARIN LEVEL (UNFRACTIONATED)
Heparin Unfractionated: >2.2 IU/mL — ABNORMAL HIGH (ref 0.30–0.70)
Heparin Unfractionated: 1 IU/mL — ABNORMAL HIGH (ref 0.30–0.70)

## 2020-02-18 MED ORDER — METOPROLOL TARTRATE 12.5 MG HALF TABLET
12.5000 mg | ORAL_TABLET | Freq: Two times a day (BID) | ORAL | Status: DC
  Administered 2020-02-18 – 2020-02-20 (×6): 12.5 mg via ORAL
  Filled 2020-02-18 (×6): qty 1

## 2020-02-18 MED ORDER — INSULIN ASPART 100 UNIT/ML ~~LOC~~ SOLN
0.0000 [IU] | Freq: Two times a day (BID) | SUBCUTANEOUS | Status: DC
  Administered 2020-02-19: 18:00:00 1 [IU] via SUBCUTANEOUS

## 2020-02-18 MED ORDER — INSULIN ASPART 100 UNIT/ML ~~LOC~~ SOLN
0.0000 [IU] | Freq: Three times a day (TID) | SUBCUTANEOUS | Status: DC
  Administered 2020-02-18: 19:00:00 1 [IU] via SUBCUTANEOUS
  Administered 2020-02-20: 2 [IU] via SUBCUTANEOUS

## 2020-02-18 MED ORDER — INSULIN ASPART 100 UNIT/ML ~~LOC~~ SOLN: 0.0000 [IU] | Freq: Every day | SUBCUTANEOUS | Status: DC

## 2020-02-18 MED ORDER — HEPARIN (PORCINE) 25000 UT/250ML-% IV SOLN
950.0000 [IU]/h | INTRAVENOUS | Status: DC
  Administered 2020-02-18: 950 [IU]/h via INTRAVENOUS
  Filled 2020-02-18: qty 250

## 2020-02-18 MED ORDER — INSULIN ASPART 100 UNIT/ML ~~LOC~~ SOLN
0.0000 [IU] | Freq: Two times a day (BID) | SUBCUTANEOUS | Status: DC
  Administered 2020-02-19: 15:00:00 4 [IU] via SUBCUTANEOUS
  Administered 2020-02-19: 2 [IU] via SUBCUTANEOUS
  Administered 2020-02-20: 6 [IU] via SUBCUTANEOUS
  Administered 2020-02-20: 15:00:00 1 [IU] via SUBCUTANEOUS

## 2020-02-18 MED ORDER — HEPARIN (PORCINE) 25000 UT/250ML-% IV SOLN
650.0000 [IU]/h | INTRAVENOUS | Status: DC
  Administered 2020-02-18: 800 [IU]/h via INTRAVENOUS

## 2020-02-18 NOTE — Progress Notes (Signed)
ANTICOAGULATION CONSULT NOTE - Initial Consult  Pharmacy Consult for heparin  Indication: chest pain/ACS  Allergies  Allergen Reactions  . Protonix [Pantoprazole] Swelling    Facial Swelling  . Toujeo Solostar [Insulin Glargine] Shortness Of Breath  . Lactose Intolerance (Gi)     GI  . Magnesium-Containing Compounds Swelling and Other (See Comments)    limbs  . Adhesive [Tape] Rash and Other (See Comments)    Band aids    Patient Measurements: Weight: 61.5 kg (135 lb 9.3 oz)   Vital Signs: Temp: 98.2 F (36.8 C) (04/16 0409) Temp Source: Oral (04/16 0409) BP: 168/75 (04/16 0409) Pulse Rate: 74 (04/16 0409)  Labs: Recent Labs    02/17/20 1816 02/18/20 0544  HGB 12.2 12.6  HCT 38.0 38.6  PLT 275 260  CREATININE 0.98 0.91    Estimated Creatinine Clearance: 58.9 mL/min (by C-G formula based on SCr of 0.91 mg/dL).   Medical History: Past Medical History:  Diagnosis Date  . Allergy   . Anemia    past history long ago  . Borderline HTN (hypertension)   . CAD (coronary artery disease)    a. 02/2020 Cath: LM nl, LAD large, 90p, 65m, D1 90p, LCX nl, OM 60-70p, RCA dominant w/ dampening upon engagement of ostium. 70p/m, RPDA 50. EF 55-65%.  . Disorder of kidney    has abd kidney (right)  . GERD (gastroesophageal reflux disease)   . History of diabetic gastroparesis   . History of echocardiogram    a. 01/2013 Echo: EF 55-65%, no rwma. Mild MR. Nl RV fxn.  Marland Kitchen HLD (hyperlipidemia)   . Hx: UTI (urinary tract infection)   . IBS (irritable bowel syndrome)   . Nephrolithiasis 2005  . Osteopenia 2012   per pt  . Systolic murmur 01/4741   mild mitral insuff  . Type 1 diabetes mellitus with diabetic retinopathy without macular edema (HCC)    dx at 59 y/o, background retinopathy    Medications:  Facility-Administered Medications Prior to Admission  Medication Dose Route Frequency Provider Last Rate Last Admin  . 0.9 %  sodium chloride infusion  500 mL Intravenous  Continuous Pyrtle, Lajuan Lines, MD       Medications Prior to Admission  Medication Sig Dispense Refill Last Dose  . aspirin EC 81 MG tablet Take 1 tablet (81 mg total) by mouth daily.   02/17/2020 at 0630  . Insulin Glargine (LANTUS SOLOSTAR) 100 UNIT/ML Solostar Pen Inject 12 Units into the skin daily. (Patient taking differently: Inject 8-10 Units into the skin See admin instructions. Inject 10 units into the skin in the morning and 8 units in the evening) 5 pen 11 02/16/2020 at pm  . esomeprazole (NEXIUM) 40 MG capsule Take 1 capsule (40 mg total) by mouth daily. Patient needs office visit for further refills 90 capsule 0   . glucagon (GLUCAGON EMERGENCY) 1 MG injection Inject 1 mg into the muscle once as needed (in case of severe hypoglycemia). 1 each 12   . Insulin Pen Needle (NOVOFINE) 32G X 6 MM MISC USE TO INJECT INSULIN 4 TIMES A DAY 200 each 1   . insulin regular (NOVOLIN R) 100 units/mL injection INJECT 6 UNITS TOTAL INTO THE SKIN 3 (THREE) TIMES DAILY BEFORE MEALS. (Patient taking differently: Inject 1-10 Units into the skin See admin instructions. INJECT 1-10 UNITS TOTAL INTO THE SKIN 3 (THREE) TIMES DAILY BEFORE MEALS. PER SLIDING SCALE) 60 mL 1   . Insulin Syringe-Needle U-100 30G X 5/16" 0.3 ML  MISC Use to inject insulin 6 times a day. 600 each 11   . Multiple Vitamin (MULTIVITAMIN) tablet Take 1 tablet by mouth daily.       . NON FORMULARY Focus Factor Takes 1 tablet twice daily.     . pravastatin (PRAVACHOL) 40 MG tablet TAKE 1.5 TABLETS (60 MG TOTAL) BY MOUTH DAILY. (Patient taking differently: Take 60 mg by mouth daily. ) 45 tablet 0   . quinapril (ACCUPRIL) 5 MG tablet Take 1 tablet (5 mg total) by mouth daily. 90 tablet 3     Assessment: 59 yo female with unstable angina s/p cath 4/15 and plans for PCI vs CABG -heparin sq given 5:30am today -hg= 12.6  Goal of Therapy:  Heparin level 0.3-0.7 units/ml Monitor platelets by anticoagulation protocol: Yes   Plan:  -Start heparin  at 950 units/hr -Heparin level in 8 hours and daily wth CBC daily  Harland German, PharmD Clinical Pharmacist **Pharmacist phone directory can now be found on [email or web address] ([email or web address]).  Listed under [facility].

## 2020-02-18 NOTE — Progress Notes (Signed)
Pre CABG has been completed.   Preliminary results in CV Proc.   Blanch Media 02/18/2020 2:35 PM

## 2020-02-18 NOTE — Progress Notes (Addendum)
Spoke with patient at the bedside. Patient states that she was diagnosed with diabetes 46 years ago. Wants to follow her home regimen for insulin dosing in the hospital.   Home dosages: (per patient) Lantus 8 units at HS, Lantus 10 units daily  Custom correction scale: CBGs: 150-210 = 1 units              211-270 = 2 units              271-330 = 3 units              331-390 = 4 units              391-450 - 5 units  Meal coverage: 1 unit/10 grams CHO (breakfast and lunch) 1 unit/20 grams CHO (dinner and snack)  Called Dr. Lavella Hammock office for verification. Staff nurse in office on lunch break at this time. Unable to talk with her at this time.   To follow up with cardiology team for orders for custom insulin correction scale and meal coverage ranges.  Smith Mince RN BSN CDE Diabetes Coordinator Pager: [phone]  8am-5pm

## 2020-02-18 NOTE — Telephone Encounter (Signed)
Spoke with patient and she was very concerned because she has only seen APP's instead of a cardiologist and she has questions about what the plan is for her. Advised that she will need to speak with her nurse to review her concerns so they can reach out to the provider about those questions she may have. She states that Dr. Mariah Milling did mention she could call us if needed and let her know that he is not in the office today but in the hospital here. Instructed her to reach out to her nurse and that I would send to Dr. Mariah Milling but that her nurse should be able to call the provider to address her concerns. She verbalized understanding with no further questions at this time.

## 2020-02-18 NOTE — Progress Notes (Signed)
Pt declines ambulation at this time as she does not want to walk the hall due to covid. Began discussing preop with pt and family. Discussed sternal precaution, IS (1500 mL), and mobility post op. She became upset as she did not know about being on the vent. Left materials for pt and family to review, RN in to help pt process. Family supportive. Will f/u for ambulation as time allows. [phone] Ethelda Chick CES, ACSM 1:55 PM 02/18/2020

## 2020-02-18 NOTE — Progress Notes (Signed)
ANTICOAGULATION CONSULT NOTE - Follow Up Consult  Pharmacy Consult for heparin  Indication: chest pain/ACS  Allergies  Allergen Reactions  . Protonix [Pantoprazole] Swelling    Facial Swelling  . Toujeo Solostar [Insulin Glargine] Shortness Of Breath  . Lactose Intolerance (Gi)     GI  . Magnesium-Containing Compounds Swelling and Other (See Comments)    limbs  . Adhesive [Tape] Rash and Other (See Comments)    Band aids    Patient Measurements: Weight: 61.5 kg (135 lb 9.3 oz)  Heparin Dosing Weight: 61.5 kg  Vital Signs: Temp: 98.1 F (36.7 C) (04/16 1948) Temp Source: Oral (04/16 1948) BP: 124/63 (04/16 1948) Pulse Rate: 72 (04/16 1948)  Labs: Recent Labs    02/17/20 1816 02/18/20 0544 02/18/20 1854  HGB 12.2 12.6  --   HCT 38.0 38.6  --   PLT 275 260  --   HEPARINUNFRC  --   --  >2.20*  CREATININE 0.98 0.91  --     Estimated Creatinine Clearance: 58.9 mL/min (by C-G formula based on SCr of 0.91 mg/dL).   Medical History: Past Medical History:  Diagnosis Date  . Allergy   . Anemia    past history long ago  . Borderline HTN (hypertension)   . CAD (coronary artery disease)    a. 02/2020 Cath: LM nl, LAD large, 90p, 80m, D1 90p, LCX nl, OM 60-70p, RCA dominant w/ dampening upon engagement of ostium. 70p/m, RPDA 50. EF 55-65%.  . Disorder of kidney    has abd kidney (right)  . GERD (gastroesophageal reflux disease)   . History of diabetic gastroparesis   . History of echocardiogram    a. 01/2013 Echo: EF 55-65%, no rwma. Mild MR. Nl RV fxn.  Marland Kitchen HLD (hyperlipidemia)   . Hx: UTI (urinary tract infection)   . IBS (irritable bowel syndrome)   . Nephrolithiasis 2005  . Osteopenia 2012   per pt  . Systolic murmur 01/2013   mild mitral insuff  . Type 1 diabetes mellitus with diabetic retinopathy without macular edema (HCC)    dx at 59 y/o, background retinopathy    Assessment: 60 yr old female with unstable angina, S/P cath 4/15, with plans for PCI vs  CABG  Heparin level ~8 hrs after starting heparin infusion at 950 units/hr was >2.20 units/ml, which is above the goal range for this pt. PT has midline catheter; floor RN drew the heparin level from the midline catheter (held heparin for 5 mins and flushed with 20 ml of saline) because pt refused peripheral stick. Lab was contacted to draw heparin level via peripheral stick. Repeat heparin level was 1.0 units/ml, which is above the goal range for this pt. CBC WNL. Per RN, no issues with IV or bleeding observed.  Goal of Therapy:  Heparin level 0.3-0.7 units/ml Monitor platelets by anticoagulation protocol: Yes   Plan:  Hold heparin infusion for 1 hour, then restart heparin infusion at 800 units/hr Check 6-hr heparin level Monitor daily heparin level, CBC Monitor for signs/symptoms of bleeding  Vicki Mallet, PharmD, BCPS, [facility] Clinical Pharmacist 02/18/20, 20:

## 2020-02-18 NOTE — Progress Notes (Addendum)
Hypoglycemic Event  CBG: 38  Treatment: 8 oz soda, graham crackers & peanut butter  Symptoms: sweating  Follow-up CBG: Time:21:45 CBG :92 Possible Reasons for Event: poor appetite  Comments/MD notified: No; will continue to monitor    [employer]

## 2020-02-18 NOTE — Plan of Care (Signed)
  Problem: Clinical Measurements: Goal: Will remain free from infection Outcome: Progressing Goal: Cardiovascular complication will be avoided Outcome: Progressing   Problem: Coping: Goal: Level of anxiety will decrease Outcome: Not Progressing

## 2020-02-18 NOTE — Telephone Encounter (Signed)
Patient calling in after being transferred to [facility] yesterday after a cath done by Dr. Mariah Milling. Patient says that she has only been seen by the PA and she stated only a doctor can decide what to do next but they are booked in surgery all day. Patient is very concerned because she feels she was transferred for severe reasons and nothing is being done.   Please advise

## 2020-02-18 NOTE — Progress Notes (Addendum)
Progress Note  Patient Name: Felicia [name] Date of Encounter: 02/18/2020  Primary Cardiologist: Julien Nordmann, MD   Subjective   No chest pain this morning.   Inpatient Medications    Scheduled Meds: . aspirin EC  81 mg Oral Daily  . esomeprazole  40 mg Oral Daily  . ezetimibe  10 mg Oral Daily  . heparin  5,000 Units Subcutaneous Q8H  . insulin aspart  0-15 Units Subcutaneous TID WC  . insulin glargine  8 Units Subcutaneous QHS   And  . insulin glargine  10 Units Subcutaneous Daily  . multivitamin with minerals  1 tablet Oral Daily  . pravastatin  40 mg Oral q1800  . sodium chloride flush  10-40 mL Intracatheter Q12H  . sodium chloride flush  3 mL Intravenous Q12H   Continuous Infusions: . sodium chloride     PRN Meds: sodium chloride, acetaminophen, nitroGLYCERIN, ondansetron (ZOFRAN) IV, sodium chloride flush, sodium chloride flush   Vital Signs    Vitals:   02/17/20 1952 02/18/20 0020 02/18/20 0409  BP: 114/61 (!) 130/58 (!) 168/75  Pulse: 76 78 74  Resp: 16 19 19   Temp: 97.9 F (36.6 C) 98.3 F (36.8 C) 98.2 F (36.8 C)  TempSrc: Oral Oral Oral  SpO2: 96% 99% 100%  Weight:   61.5 kg    Intake/Output Summary (Last 24 hours) at 02/18/2020 0824 Last data filed at 02/17/2020 2228 Gross per 24 hour  Intake 730 ml  Output --  Net 730 ml   Last 3 Weights 02/18/2020 02/17/2020 02/08/2020  Weight (lbs) 135 lb 9.3 oz 136 lb 138 lb 6 oz  Weight (kg) 61.5 kg 61.689 kg 62.766 kg      Telemetry    SR - Personally Reviewed  ECG    No new tracing this morning.   Physical Exam   GEN: No acute distress.   Neck: No JVD Cardiac: RRR, no murmurs, rubs, or gallops.  Respiratory: Clear to auscultation bilaterally. GI: Soft, nontender, non-distended  MS: No edema; No deformity. Right groin site stable. Neuro:  Nonfocal  Psych: Normal affect   Labs    High Sensitivity Troponin:   Recent Labs  Lab 02/07/20 1134 02/07/20 1405  TROPONINIHS 3 3       Chemistry Recent Labs  Lab 02/17/20 1816 02/18/20 0544  NA  --  137  K  --  4.3  CL  --  103  CO2  --  25  GLUCOSE  --  269*  BUN  --  18  CREATININE 0.98 0.91  CALCIUM  --  9.0  GFRNONAA >60 >60  GFRAA >60 >60  ANIONGAP  --  9     Hematology Recent Labs  Lab 02/17/20 1816 02/18/20 0544  WBC 5.9 5.5  RBC 4.28 4.33  HGB 12.2 12.6  HCT 38.0 38.6  MCV 88.8 89.1  MCH 28.5 29.1  MCHC 32.1 32.6  RDW 13.2 13.2  PLT 275 260    BNPNo results for input(s): BNP, PROBNP in the last 168 hours.   DDimer No results for input(s): DDIMER in the last 168 hours.   Radiology    CARDIAC CATHETERIZATION  Result Date: 02/17/2020  1st Mrg lesion is 60% stenosed.  Prox LAD to Mid LAD lesion is 90% stenosed.  Mid LAD-1 lesion is 80% stenosed.  Mid LAD-2 lesion is 50% stenosed.  1st Diag-1 lesion is 95% stenosed.  1st Diag-2 lesion is 70% stenosed.  Ost RCA to Prox RCA lesion  is 70% stenosed.  Mid RCA lesion is 70% stenosed.  RPDA lesion is 50% stenosed.  The left ventricular ejection fraction is greater than 65% by visual estimate.  LV end diastolic pressure is normal.  The left ventricular systolic function is normal.  There is no mitral valve regurgitation.    ECHOCARDIOGRAM COMPLETE  Result Date: 02/17/2020    ECHOCARDIOGRAM REPORT   Patient Name:   Felicia [name] Date of Exam: 02/17/2020 Medical Rec #:  [id number]      Height:       64.0 in Accession #:    [id number]     Weight:       136.0 lb Date of Birth:  02/04/1961      BSA:          1.661 m Patient Age:    59 years       BP:           185/81 mmHg Patient Gender: F              HR:           82 bpm. Exam Location:  Inpatient Procedure: 2D Echo Indications:    coronary artery disease.  History:        Patient has prior history of Echocardiogram examinations, most                 recent 01/19/2013. Signs/Symptoms:Chest Pain; Risk                 Factors:Hypertension, Diabetes and Dyslipidemia.  Sonographer:    Delcie Roch Referring Phys: [id number] HARRELL O LIGHTFOOT IMPRESSIONS  1. Left ventricular ejection fraction, by estimation, is 65 to 70%. The left ventricle has normal function. The left ventricle has no regional wall motion abnormalities. Left ventricular diastolic parameters are consistent with Grade I diastolic dysfunction (impaired relaxation).  2. Right ventricular systolic function is normal. The right ventricular size is normal.  3. The mitral valve is normal in structure. Mild mitral valve regurgitation. No evidence of mitral stenosis.  4. The aortic valve is normal in structure. Aortic valve regurgitation is not visualized. Mild to moderate aortic valve sclerosis/calcification is present, without any evidence of aortic stenosis.  5. The inferior vena cava is normal in size with greater than 50% respiratory variability, suggesting right atrial pressure of 3 mmHg. FINDINGS  Left Ventricle: Left ventricular ejection fraction, by estimation, is 65 to 70%. The left ventricle has normal function. The left ventricle has no regional wall motion abnormalities. The left ventricular internal cavity size was normal in size. There is  no left ventricular hypertrophy. Left ventricular diastolic parameters are consistent with Grade I diastolic dysfunction (impaired relaxation). Right Ventricle: The right ventricular size is normal. No increase in right ventricular wall thickness. Right ventricular systolic function is normal. Left Atrium: Left atrial size was normal in size. Right Atrium: Right atrial size was normal in size. Pericardium: There is no evidence of pericardial effusion. Mitral Valve: The mitral valve is normal in structure. Normal mobility of the mitral valve leaflets. Mild mitral valve regurgitation. No evidence of mitral valve stenosis. Tricuspid Valve: The tricuspid valve is normal in structure. Tricuspid valve regurgitation is trivial. No evidence of tricuspid stenosis. Aortic Valve: The aortic valve is  normal in structure. Aortic valve regurgitation is not visualized. Mild to moderate aortic valve sclerosis/calcification is present, without any evidence of aortic stenosis. Pulmonic Valve: The pulmonic valve was normal in structure. Pulmonic valve regurgitation is not visualized.  No evidence of pulmonic stenosis. Aorta: The aortic root is normal in size and structure. Venous: The inferior vena cava is normal in size with greater than 50% respiratory variability, suggesting right atrial pressure of 3 mmHg. IAS/Shunts: No atrial level shunt detected by color flow Doppler.  LEFT VENTRICLE PLAX 2D LVIDd:         3.70 cm  Diastology LVIDs:         2.40 cm  LV e' lateral:   11.30 cm/s LV PW:         0.70 cm  LV E/e' lateral: 11.0 LV IVS:        0.80 cm  LV e' medial:    8.38 cm/s LVOT diam:     1.90 cm  LV E/e' medial:  14.8 LV SV:         62 LV SV Index:   37 LVOT Area:     2.84 cm  RIGHT VENTRICLE             IVC RV S prime:     16.40 cm/s  IVC diam: 1.20 cm TAPSE (M-mode): 2.6 cm LEFT ATRIUM             Index LA diam:        3.00 cm 1.81 cm/m LA Vol (A2C):   29.6 ml 17.82 ml/m LA Vol (A4C):   28.4 ml 17.10 ml/m LA Biplane Vol: 29.2 ml 17.58 ml/m  AORTIC VALVE LVOT Vmax:   114.00 cm/s LVOT Vmean:  72.100 cm/s LVOT VTI:    0.219 m  AORTA Ao Root diam: 2.60 cm MITRAL VALVE MV Area (PHT): 3.63 cm     SHUNTS MV Decel Time: 209 msec     Systemic VTI:  0.22 m MV E velocity: 124.00 cm/s  Systemic Diam: 1.90 cm MV A velocity: 105.00 cm/s MV E/A ratio:  1.18 Ena Dawley MD Electronically signed by Ena Dawley MD Signature Date/Time: 02/17/2020/6:14:03 PM    Final     Cardiac Studies   Cath: 02/17/20   1st Mrg lesion is 60% stenosed.  Prox LAD to Mid LAD lesion is 90% stenosed.  Mid LAD-1 lesion is 80% stenosed.  Mid LAD-2 lesion is 50% stenosed.  1st Diag-1 lesion is 95% stenosed.  1st Diag-2 lesion is 70% stenosed.  Ost RCA to Prox RCA lesion is 70% stenosed.  Mid RCA lesion is 70%  stenosed.  RPDA lesion is 50% stenosed.  The left ventricular ejection fraction is greater than 65% by visual estimate.  LV end diastolic pressure is normal.  The left ventricular systolic function is normal.  There is no mitral valve regurgitation.  Diagnostic Dominance: Right   Echo: 02/17/20  IMPRESSIONS    1. Left ventricular ejection fraction, by estimation, is 65 to 70%. The  left ventricle has normal function. The left ventricle has no regional  wall motion abnormalities. Left ventricular diastolic parameters are  consistent with Grade I diastolic  dysfunction (impaired relaxation).  2. Right ventricular systolic function is normal. The right ventricular  size is normal.  3. The mitral valve is normal in structure. Mild mitral valve  regurgitation. No evidence of mitral stenosis.  4. The aortic valve is normal in structure. Aortic valve regurgitation is  not visualized. Mild to moderate aortic valve sclerosis/calcification is  present, without any evidence of aortic stenosis.  5. The inferior vena cava is normal in size with greater than 50%  respiratory variability, suggesting right atrial pressure of 3 mmHg.  Patient Profile     59 y.o. female with a history of type 1 diabetes, hypertension, hyperlipidemia, gastroparesis/irritable bowel syndrome, coronary and aortic atherosclerosis previously seen on CT, and myelopathy, who was recently evaluated in outpatient secondary unstable angina underwent diagnostic catheterization April 15 revealing moderate to severe multivessel disease prompting transfer to [facility] for further CT surgical and interventional evaluation.  Assessment & Plan    1.  Unstable angina/coronary artery disease: Patient was recently evaluated secondary to a several week history of exertional angina relieved with rest.  She underwent diagnostic catheterization this morning, April 15, revealing severe LAD and diagonal disease with moderate to  severe obtuse marginal and RCA.  Films reviewed by interventional cardiology with recommendation for CT surgical evaluation versus LAD atherectomy. She was transferred to [facility] yesterday. Dr. [name] to see. Appears plan will be for CABG tentatively on Monday.  -- Continue aspirin and statin therapy.  Start IV heparin today  2.  Essential hypertension: Blood pressure stable to add metoprolol this morning.  3.  Hyperlipidemia: Has tried multiple statins but thus far, has only tolerated Pravachol.   -- Continue Pravachol and Zetia (added this admission)   4.  Type 1 diabetes mellitus: Continue home insulin therapy with sliding scale.  -- diabetes educator consult   For questions or updates, please contact [facility] Please consult [email or web address] for contact info under   Signed, Laverda Page, NP  02/18/2020, 8:24 AM    Agree with note by Laverda Page NP-C  Ms. Felicia [name] underwent cardiac catheterization by Dr. Mariah Milling at [facility] yesterday revealing three-vessel disease with preserved LV function.  She requires CABG.  Dr. [name] saw her this morning and she is being scheduled for Monday.  She is currently asymptomatic and denies chest pain.  We will put on IV heparin over the weekend.  Runell Gess, M.D., FACP, [facility], [facility] [address] [place], [place]  [address]  [phone] 02/18/2020 9:53 AM

## 2020-02-19 DIAGNOSIS — I200 Unstable angina: Secondary | ICD-10-CM | POA: Diagnosis not present

## 2020-02-19 LAB — URINALYSIS, ROUTINE W REFLEX MICROSCOPIC
Bilirubin Urine: NEGATIVE
Glucose, UA: NEGATIVE mg/dL
Hgb urine dipstick: NEGATIVE
Ketones, ur: NEGATIVE mg/dL
Leukocytes,Ua: NEGATIVE
Nitrite: NEGATIVE
Protein, ur: NEGATIVE mg/dL
Specific Gravity, Urine: 1.016 (ref 1.005–1.030)
pH: 5 (ref 5.0–8.0)

## 2020-02-19 LAB — GLUCOSE, CAPILLARY
Glucose-Capillary: 118 mg/dL — ABNORMAL HIGH (ref 70–99)
Glucose-Capillary: 170 mg/dL — ABNORMAL HIGH (ref 70–99)
Glucose-Capillary: 99 mg/dL (ref 70–99)
Glucose-Capillary: 91 mg/dL (ref 70–99)
Glucose-Capillary: 117 mg/dL — ABNORMAL HIGH (ref 70–99)

## 2020-02-19 LAB — SURGICAL PCR SCREEN
MRSA, PCR: NEGATIVE
Staphylococcus aureus: NEGATIVE

## 2020-02-19 LAB — CBC
HCT: 38.2 % (ref 36.0–46.0)
Hemoglobin: 12.2 g/dL (ref 12.0–15.0)
MCH: 28.4 pg (ref 26.0–34.0)
MCHC: 31.9 g/dL (ref 30.0–36.0)
MCV: 88.8 fL (ref 80.0–100.0)
Platelets: 272 10*3/uL (ref 150–400)
RBC: 4.3 MIL/uL (ref 3.87–5.11)
RDW: 13.2 % (ref 11.5–15.5)
WBC: 5.4 10*3/uL (ref 4.0–10.5)
nRBC: 0 % (ref 0.0–0.2)

## 2020-02-19 LAB — HEPARIN LEVEL (UNFRACTIONATED): Heparin Unfractionated: 0.81 IU/mL — ABNORMAL HIGH (ref 0.30–0.70)

## 2020-02-19 MED ORDER — HEPARIN SODIUM (PORCINE) 5000 UNIT/ML IJ SOLN
5000.0000 [IU] | Freq: Three times a day (TID) | INTRAMUSCULAR | Status: DC
  Administered 2020-02-19 – 2020-02-20 (×5): 5000 [IU] via SUBCUTANEOUS
  Filled 2020-02-19 (×5): qty 1

## 2020-02-19 NOTE — Progress Notes (Signed)
ANTICOAGULATION CONSULT NOTE - Follow Up Consult  Pharmacy Consult for heparin  Indication: chest pain/ACS  Allergies  Allergen Reactions  . Protonix [Pantoprazole] Swelling    Facial Swelling  . Toujeo Solostar [Insulin Glargine] Shortness Of Breath  . Lactose Intolerance (Gi)     GI  . Magnesium-Containing Compounds Swelling and Other (See Comments)    limbs  . Adhesive [Tape] Rash and Other (See Comments)    Band aids    Patient Measurements: Weight: 61.5 kg (135 lb 9.3 oz)  Heparin Dosing Weight: 61.5 kg  Vital Signs: Temp: 98 F (36.7 C) (04/17 0508) Temp Source: Oral (04/17 0508) BP: 136/72 (04/17 0508) Pulse Rate: 67 (04/17 0508)  Labs: Recent Labs    02/17/20 1816 02/17/20 1816 02/18/20 0544 02/18/20 1854 02/18/20 2053 02/19/20 0452  HGB 12.2   < > 12.6  --   --  12.2  HCT 38.0  --  38.6  --   --  38.2  PLT 275  --  260  --   --  272  HEPARINUNFRC  --   --   --  >2.20* 1.00* 0.81*  CREATININE 0.98  --  0.91  --   --   --    < > = values in this interval not displayed.    Estimated Creatinine Clearance: 58.9 mL/min (by C-G formula based on SCr of 0.91 mg/dL).  Assessment: 59 yr old female with unstable angina, S/P cath 4/15, with plans for PCI vs CABG. Pt restarted heparin post cath  Heparin level remains supratherapeutic (0.81) on gtt at 800 units/hr. No bleeding noted.   Goal of Therapy:  Heparin level 0.3-0.7 units/ml Monitor platelets by anticoagulation protocol: Yes   Plan:  Decrease heparin to 650 units/hr Check 6-hr heparin level  Christoper Fabian, PharmD, BCPS Please see amion for complete clinical pharmacist phone list 02/19/2020 6:44 AM

## 2020-02-19 NOTE — Progress Notes (Signed)
Progress Note  Patient Name: Felicia [name] Date of Encounter: 02/19/2020  Primary Cardiologist:   Ida Rogue, MD   Subjective   No chest pain.  She does not want IV heparin secondary to being a difficult stick.  Denies SOB.   Inpatient Medications    Scheduled Meds: . aspirin EC  81 mg Oral Daily  . esomeprazole  40 mg Oral Daily  . ezetimibe  10 mg Oral Daily  . insulin aspart  0-2 Units Subcutaneous QHS  . insulin aspart  0-4 Units Subcutaneous BID  . insulin aspart  0-5 Units Subcutaneous TID WC  . insulin aspart  0-7 Units Subcutaneous BID WC  . insulin glargine  8 Units Subcutaneous QHS   And  . insulin glargine  10 Units Subcutaneous Daily  . metoprolol tartrate  12.5 mg Oral BID  . multivitamin with minerals  1 tablet Oral Daily  . pravastatin  40 mg Oral q1800  . sodium chloride flush  10-40 mL Intracatheter Q12H  . sodium chloride flush  3 mL Intravenous Q12H   Continuous Infusions: . sodium chloride    . heparin 650 Units/hr (02/19/20 0727)   PRN Meds: sodium chloride, acetaminophen, nitroGLYCERIN, ondansetron (ZOFRAN) IV, sodium chloride flush, sodium chloride flush   Vital Signs    Vitals:   02/18/20 1321 02/18/20 1948 02/18/20 2150 02/19/20 0508  BP: 138/64 124/63 139/63 136/72  Pulse: 72 72 70 67  Resp: 18 16  16   Temp: 98 F (36.7 C) 98.1 F (36.7 C)  98 F (36.7 C)  TempSrc: Oral Oral  Oral  SpO2: 99% 98%  100%  Weight:    62 kg   No intake or output data in the 24 hours ending 02/19/20 1301 Filed Weights   02/18/20 0409 02/19/20 0508  Weight: 61.5 kg 62 kg    Telemetry    NSR - Personally Reviewed  ECG    NA - Personally Reviewed  Physical Exam   GEN: No acute distress.   Neck: No  JVD Cardiac: RRR, no murmurs, rubs, or gallops.  Respiratory: Clear  to auscultation bilaterally. GI: Soft, nontender, non-distended  MS: No  edema; No deformity. Neuro:  Nonfocal  Psych: Normal affect   Labs    Chemistry Recent Labs    Lab 02/17/20 1816 02/18/20 0544  NA  --  137  K  --  4.3  CL  --  103  CO2  --  25  GLUCOSE  --  269*  BUN  --  18  CREATININE 0.98 0.91  CALCIUM  --  9.0  GFRNONAA >60 >60  GFRAA >60 >60  ANIONGAP  --  9     Hematology Recent Labs  Lab 02/17/20 1816 02/18/20 0544 02/19/20 0452  WBC 5.9 5.5 5.4  RBC 4.28 4.33 4.30  HGB 12.2 12.6 12.2  HCT 38.0 38.6 38.2  MCV 88.8 89.1 88.8  MCH 28.5 29.1 28.4  MCHC 32.1 32.6 31.9  RDW 13.2 13.2 13.2  PLT 275 260 272    Cardiac EnzymesNo results for input(s): TROPONINI in the last 168 hours. No results for input(s): TROPIPOC in the last 168 hours.   BNPNo results for input(s): BNP, PROBNP in the last 168 hours.   DDimer No results for input(s): DDIMER in the last 168 hours.   Radiology    ECHOCARDIOGRAM COMPLETE  Result Date: 02/17/2020    ECHOCARDIOGRAM REPORT   Patient Name:   Felicia [name] Date of Exam: 02/17/2020 Medical  Rec #:  [id number]      Height:       64.0 in Accession #:    [id number]     Weight:       136.0 lb Date of Birth:  [birth year]      BSA:          1.661 m Patient Age:    59 years       BP:           185/81 mmHg Patient Gender: F              HR:           82 bpm. Exam Location:  Inpatient Procedure: 2D Echo Indications:    coronary artery disease.  History:        Patient has prior history of Echocardiogram examinations, most                 recent 01/19/2013. Signs/Symptoms:Chest Pain; Risk                 Factors:Hypertension, Diabetes and Dyslipidemia.  Sonographer:    Delcie Roch Referring Phys: [id number] HARRELL O LIGHTFOOT IMPRESSIONS  1. Left ventricular ejection fraction, by estimation, is 65 to 70%. The left ventricle has normal function. The left ventricle has no regional wall motion abnormalities. Left ventricular diastolic parameters are consistent with Grade I diastolic dysfunction (impaired relaxation).  2. Right ventricular systolic function is normal. The right ventricular size is normal.  3. The  mitral valve is normal in structure. Mild mitral valve regurgitation. No evidence of mitral stenosis.  4. The aortic valve is normal in structure. Aortic valve regurgitation is not visualized. Mild to moderate aortic valve sclerosis/calcification is present, without any evidence of aortic stenosis.  5. The inferior vena cava is normal in size with greater than 50% respiratory variability, suggesting right atrial pressure of 3 mmHg. FINDINGS  Left Ventricle: Left ventricular ejection fraction, by estimation, is 65 to 70%. The left ventricle has normal function. The left ventricle has no regional wall motion abnormalities. The left ventricular internal cavity size was normal in size. There is  no left ventricular hypertrophy. Left ventricular diastolic parameters are consistent with Grade I diastolic dysfunction (impaired relaxation). Right Ventricle: The right ventricular size is normal. No increase in right ventricular wall thickness. Right ventricular systolic function is normal. Left Atrium: Left atrial size was normal in size. Right Atrium: Right atrial size was normal in size. Pericardium: There is no evidence of pericardial effusion. Mitral Valve: The mitral valve is normal in structure. Normal mobility of the mitral valve leaflets. Mild mitral valve regurgitation. No evidence of mitral valve stenosis. Tricuspid Valve: The tricuspid valve is normal in structure. Tricuspid valve regurgitation is trivial. No evidence of tricuspid stenosis. Aortic Valve: The aortic valve is normal in structure. Aortic valve regurgitation is not visualized. Mild to moderate aortic valve sclerosis/calcification is present, without any evidence of aortic stenosis. Pulmonic Valve: The pulmonic valve was normal in structure. Pulmonic valve regurgitation is not visualized. No evidence of pulmonic stenosis. Aorta: The aortic root is normal in size and structure. Venous: The inferior vena cava is normal in size with greater than 50%  respiratory variability, suggesting right atrial pressure of 3 mmHg. IAS/Shunts: No atrial level shunt detected by color flow Doppler.  LEFT VENTRICLE PLAX 2D LVIDd:         3.70 cm  Diastology LVIDs:         2.40 cm  LV e'  lateral:   11.30 cm/s LV PW:         0.70 cm  LV E/e' lateral: 11.0 LV IVS:        0.80 cm  LV e' medial:    8.38 cm/s LVOT diam:     1.90 cm  LV E/e' medial:  14.8 LV SV:         62 LV SV Index:   37 LVOT Area:     2.84 cm  RIGHT VENTRICLE             IVC RV S prime:     16.40 cm/s  IVC diam: 1.20 cm TAPSE (M-mode): 2.6 cm LEFT ATRIUM             Index LA diam:        3.00 cm 1.81 cm/m LA Vol (A2C):   29.6 ml 17.82 ml/m LA Vol (A4C):   28.4 ml 17.10 ml/m LA Biplane Vol: 29.2 ml 17.58 ml/m  AORTIC VALVE LVOT Vmax:   114.00 cm/s LVOT Vmean:  72.100 cm/s LVOT VTI:    0.219 m  AORTA Ao Root diam: 2.60 cm MITRAL VALVE MV Area (PHT): 3.63 cm     SHUNTS MV Decel Time: 209 msec     Systemic VTI:  0.22 m MV E velocity: 124.00 cm/s  Systemic Diam: 1.90 cm MV A velocity: 105.00 cm/s MV E/A ratio:  1.18 Tobias Alexander MD Electronically signed by Tobias Alexander MD Signature Date/Time: 02/17/2020/6:14:03 PM    Final    VAS US DOPPLER PRE CABG  Result Date: 02/18/2020 PREOPERATIVE VASCULAR EVALUATION  Indications:      Pre-CABG. Risk Factors:     Hypertension, Diabetes, coronary artery disease. Comparison Study: no prior Performing Technologist: Blanch Media RVS  Examination Guidelines: A complete evaluation includes B-mode imaging, spectral Doppler, color Doppler, and power Doppler as needed of all accessible portions of each vessel. Bilateral testing is considered an integral part of a complete examination. Limited examinations for reoccurring indications may be performed as noted.  Right Carotid Findings: +----------+--------+--------+--------+------------+--------+           PSV cm/sEDV cm/sStenosisDescribe    Comments +----------+--------+--------+--------+------------+--------+ CCA  Prox  86      17              heterogenous         +----------+--------+--------+--------+------------+--------+ CCA Distal58      19              heterogenous         +----------+--------+--------+--------+------------+--------+ ICA Prox  57      18      1-39%   heterogenous         +----------+--------+--------+--------+------------+--------+ ICA Distal49      19                                   +----------+--------+--------+--------+------------+--------+ ECA       80      10                                   +----------+--------+--------+--------+------------+--------+ Portions of this table do not appear on this page. +----------+--------+-------+--------+------------+           PSV cm/sEDV cmsDescribeArm Pressure +----------+--------+-------+--------+------------+ Subclavian78                                  +----------+--------+-------+--------+------------+ +---------+--------+--+--------+-+---------+  VertebralPSV cm/s36EDV cm/s9Antegrade +---------+--------+--+--------+-+---------+ Left Carotid Findings: +----------+--------+--------+--------+------------+--------+           PSV cm/sEDV cm/sStenosisDescribe    Comments +----------+--------+--------+--------+------------+--------+ CCA Prox  98      24              heterogenous         +----------+--------+--------+--------+------------+--------+ CCA Distal55      19              heterogenous         +----------+--------+--------+--------+------------+--------+ ICA Prox  59      19      1-39%   heterogenous         +----------+--------+--------+--------+------------+--------+ ICA Distal81      22                                   +----------+--------+--------+--------+------------+--------+ ECA       64      10                                   +----------+--------+--------+--------+------------+--------+ +----------+--------+--------+--------+------------+  SubclavianPSV cm/sEDV cm/sDescribeArm Pressure +----------+--------+--------+--------+------------+           96                      145          +----------+--------+--------+--------+------------+ +---------+--------+--+--------+--+---------+ VertebralPSV cm/s40EDV cm/s12Antegrade +---------+--------+--+--------+--+---------+  ABI Findings: +--------+------------------+-----+---------+--------+ Right   Rt Pressure (mmHg)IndexWaveform Comment  +--------+------------------+-----+---------+--------+ Brachial                       triphasiciv       +--------+------------------+-----+---------+--------+ PTA     146               1.01 triphasic         +--------+------------------+-----+---------+--------+ DP      156               1.08 triphasic         +--------+------------------+-----+---------+--------+ +--------+------------------+-----+---------+-------+ Left    Lt Pressure (mmHg)IndexWaveform Comment +--------+------------------+-----+---------+-------+ [id number]                    triphasic        +--------+------------------+-----+---------+-------+ PTA     131               0.90 triphasic        +--------+------------------+-----+---------+-------+ DP      154               1.06 triphasic        +--------+------------------+-----+---------+-------+ +-------+---------------+----------------+ ABI/TBIToday's ABI/TBIPrevious ABI/TBI +-------+---------------+----------------+ Right  1.08                            +-------+---------------+----------------+ Left   1.06                            +-------+---------------+----------------+  Right Doppler Findings: +--------+--------+-----+---------+--------+ Site    PressureIndexDoppler  Comments +--------+--------+-----+---------+--------+ Brachial             triphasiciv       +--------+--------+-----+---------+--------+ Radial               triphasic          +--------+--------+-----+---------+--------+  Ulnar                triphasic         +--------+--------+-----+---------+--------+  Left Doppler Findings: +--------+--------+-----+---------+--------+ Site    PressureIndexDoppler  Comments +--------+--------+-----+---------+--------+ [id number]          triphasic         +--------+--------+-----+---------+--------+ Radial               triphasic         +--------+--------+-----+---------+--------+ Ulnar                triphasic         +--------+--------+-----+---------+--------+  Summary: Right Carotid: Velocities in the right ICA are consistent with a 1-39% stenosis. Left Carotid: Velocities in the left ICA are consistent with a 1-39% stenosis. Vertebrals: Bilateral vertebral arteries demonstrate antegrade flow. Right ABI: Resting right ankle-brachial index is within normal range. No evidence of significant right lower extremity arterial disease. Left ABI: Resting left ankle-brachial index is within normal range. No evidence of significant left lower extremity arterial disease. Right Upper Extremity: Doppler waveforms remain within normal limits with right radial compression. Doppler waveforms remain within normal limits with right ulnar compression. Left Upper Extremity: Doppler waveform obliterate with left radial compression. Doppler waveforms remain within normal limits with left ulnar compression.  Electronically signed by Sherald Hesshristopher Clark MD on 02/18/2020 at 4:04:18 PM.    Final     Cardiac Studies   Cath: 02/17/20   1st Mrg lesion is 60% stenosed.  Prox LAD to Mid LAD lesion is 90% stenosed.  Mid LAD-1 lesion is 80% stenosed.  Mid LAD-2 lesion is 50% stenosed.  1st Diag-1 lesion is 95% stenosed.  1st Diag-2 lesion is 70% stenosed.  Ost RCA to Prox RCA lesion is 70% stenosed.  Mid RCA lesion is 70% stenosed.  RPDA lesion is 50% stenosed.  The left ventricular ejection fraction is greater than 65% by  visual estimate.  LV end diastolic pressure is normal.  The left ventricular systolic function is normal.  There is no mitral valve regurgitation.  Diagnostic Dominance: Right   Echo: 02/17/20  IMPRESSIONS    1. Left ventricular ejection fraction, by estimation, is 65 to 70%. The  left ventricle has normal function. The left ventricle has no regional  wall motion abnormalities. Left ventricular diastolic parameters are  consistent with Grade I diastolic  dysfunction (impaired relaxation).  2. Right ventricular systolic function is normal. The right ventricular  size is normal.  3. The mitral valve is normal in structure. Mild mitral valve  regurgitation. No evidence of mitral stenosis.  4. The aortic valve is normal in structure. Aortic valve regurgitation is  not visualized. Mild to moderate aortic valve sclerosis/calcification is  present, without any evidence of aortic stenosis.  5. The inferior vena cava is normal in size with greater than 50%  respiratory variability, suggesting right atrial pressure of 3 mmHg.   Patient Profile     59 y.o. female with a history of type 1 diabetes, hypertension, hyperlipidemia, gastroparesis/irritable bowel syndrome, coronary and aortic atherosclerosis previously seen on CT, and myelopathy, who was recently evaluated in outpatient secondary unstable angina underwent diagnostic catheterization April 15 revealing moderate to severe multivessel disease [facility] and interventional evaluation.  Assessment & Plan    UNSTABLE ANGINA:  CABG on Monday.  OK to come off of IV heparin.  Will start DVT prophylaxis.   HTN:  BP at target.  DYSLIPIDEMIA:  She will start PCSK9 as an out patient.    DM:  She sees Dr. Elvera Lennox and so will have OMT I am sure on follow up.     For questions or updates, please contact [facility] Please consult [email or web address] for contact info under  Cardiology/STEMI.   Signed, Rollene Rotunda, MD  02/19/2020, 1:01 PM

## 2020-02-20 DIAGNOSIS — I25110 Atherosclerotic heart disease of native coronary artery with unstable angina pectoris: Secondary | ICD-10-CM | POA: Diagnosis not present

## 2020-02-20 DIAGNOSIS — Z794 Long term (current) use of insulin: Secondary | ICD-10-CM | POA: Diagnosis not present

## 2020-02-20 DIAGNOSIS — E1043 Type 1 diabetes mellitus with diabetic autonomic (poly)neuropathy: Secondary | ICD-10-CM | POA: Diagnosis not present

## 2020-02-20 DIAGNOSIS — I200 Unstable angina: Secondary | ICD-10-CM | POA: Diagnosis not present

## 2020-02-20 DIAGNOSIS — D62 Acute posthemorrhagic anemia: Secondary | ICD-10-CM | POA: Diagnosis not present

## 2020-02-20 LAB — COMPREHENSIVE METABOLIC PANEL
ALT: 14 U/L (ref 0–44)
AST: 19 U/L (ref 15–41)
Albumin: 3.3 g/dL — ABNORMAL LOW (ref 3.5–5.0)
Alkaline Phosphatase: 57 U/L (ref 38–126)
Anion gap: 10 (ref 5–15)
BUN: 18 mg/dL (ref 6–20)
CO2: 23 mmol/L (ref 22–32)
Calcium: 8.8 mg/dL — ABNORMAL LOW (ref 8.9–10.3)
Chloride: 103 mmol/L (ref 98–111)
Creatinine, Ser: 0.95 mg/dL (ref 0.44–1.00)
GFR calc Af Amer: >60 mL/min (ref >60)
GFR calc non Af Amer: >60 mL/min (ref >60)
Glucose, Bld: 256 mg/dL — ABNORMAL HIGH (ref 70–99)
Potassium: 4.2 mmol/L (ref 3.5–5.1)
Sodium: 136 mmol/L (ref 135–145)
Total Bilirubin: 0.7 mg/dL (ref 0.3–1.2)
Total Protein: 6.3 g/dL — ABNORMAL LOW (ref 6.5–8.1)

## 2020-02-20 LAB — CBC
HCT: 37.1 % (ref 36.0–46.0)
Hemoglobin: 12.1 g/dL (ref 12.0–15.0)
MCH: 29.1 pg (ref 26.0–34.0)
MCHC: 32.6 g/dL (ref 30.0–36.0)
MCV: 89.2 fL (ref 80.0–100.0)
Platelets: 220 10*3/uL (ref 150–400)
RBC: 4.16 MIL/uL (ref 3.87–5.11)
RDW: 13 % (ref 11.5–15.5)
WBC: 5 10*3/uL (ref 4.0–10.5)
nRBC: 0 % (ref 0.0–0.2)

## 2020-02-20 LAB — TSH: TSH: 2.744 u[IU]/mL (ref 0.350–4.500)

## 2020-02-20 LAB — GLUCOSE, CAPILLARY
Glucose-Capillary: 241 mg/dL — ABNORMAL HIGH (ref 70–99)
Glucose-Capillary: 202 mg/dL — ABNORMAL HIGH (ref 70–99)
Glucose-Capillary: 179 mg/dL — ABNORMAL HIGH (ref 70–99)
Glucose-Capillary: 134 mg/dL — ABNORMAL HIGH (ref 70–99)

## 2020-02-20 LAB — PROTIME-INR
INR: 0.9 (ref 0.8–1.2)
Prothrombin Time: 12.4 seconds (ref 11.4–15.2)

## 2020-02-20 LAB — ABO/RH: ABO/RH(D): A POS

## 2020-02-20 LAB — PREPARE RBC (CROSSMATCH)

## 2020-02-20 MED ORDER — EPINEPHRINE HCL 5 MG/250ML IV SOLN IN NS
0.0000 ug/min | INTRAVENOUS | Status: DC
  Filled 2020-02-20: qty 250

## 2020-02-20 MED ORDER — INSULIN REGULAR(HUMAN) IN NACL 100-0.9 UT/100ML-% IV SOLN
INTRAVENOUS | Status: AC
  Administered 2020-02-21: 2.6 [IU]/h via INTRAVENOUS
  Filled 2020-02-20: qty 100

## 2020-02-20 MED ORDER — TRANEXAMIC ACID 1000 MG/10ML IV SOLN
1.5000 mg/kg/h | INTRAVENOUS | Status: AC
  Administered 2020-02-21: 1.5 mg/kg/h via INTRAVENOUS
  Filled 2020-02-20: qty 25

## 2020-02-20 MED ORDER — MORPHINE SULFATE (PF) 2 MG/ML IV SOLN
INTRAVENOUS | Status: AC
  Filled 2020-02-20: qty 1

## 2020-02-20 MED ORDER — SODIUM CHLORIDE 0.9 % IV SOLN
1.5000 g | INTRAVENOUS | Status: AC
  Administered 2020-02-21: 1.5 g via INTRAVENOUS
  Filled 2020-02-20 (×2): qty 1.5

## 2020-02-20 MED ORDER — MILRINONE LACTATE IN DEXTROSE 20-5 MG/100ML-% IV SOLN
0.3000 ug/kg/min | INTRAVENOUS | Status: DC
  Filled 2020-02-20: qty 100

## 2020-02-20 MED ORDER — CHLORHEXIDINE GLUCONATE 0.12 % MT SOLN
15.0000 mL | Freq: Once | OROMUCOSAL | Status: DC
  Filled 2020-02-20: qty 15

## 2020-02-20 MED ORDER — TRANEXAMIC ACID (OHS) PUMP PRIME SOLUTION
2.0000 mg/kg | INTRAVENOUS | Status: DC
  Filled 2020-02-20: qty 1.21

## 2020-02-20 MED ORDER — PHENYLEPHRINE HCL-NACL 20-0.9 MG/250ML-% IV SOLN
30.0000 ug/min | INTRAVENOUS | Status: AC
  Administered 2020-02-21: 09:00:00 20 ug/min via INTRAVENOUS
  Filled 2020-02-20: qty 250

## 2020-02-20 MED ORDER — NOREPINEPHRINE 4 MG/250ML-% IV SOLN
0.0000 ug/min | INTRAVENOUS | Status: DC
  Filled 2020-02-20: qty 250

## 2020-02-20 MED ORDER — TEMAZEPAM 15 MG PO CAPS: 15.0000 mg | ORAL_CAPSULE | Freq: Once | ORAL | Status: DC | PRN

## 2020-02-20 MED ORDER — MAGNESIUM SULFATE 50 % IJ SOLN
40.0000 meq | INTRAMUSCULAR | Status: DC
  Filled 2020-02-20: qty 9.85

## 2020-02-20 MED ORDER — NITROGLYCERIN IN D5W 200-5 MCG/ML-% IV SOLN
2.0000 ug/min | INTRAVENOUS | Status: DC
  Filled 2020-02-20: qty 250

## 2020-02-20 MED ORDER — TRANEXAMIC ACID (OHS) BOLUS VIA INFUSION
15.0000 mg/kg | INTRAVENOUS | Status: AC
  Administered 2020-02-21: 910.5 mg via INTRAVENOUS
  Filled 2020-02-20: qty 911

## 2020-02-20 MED ORDER — CHLORHEXIDINE GLUCONATE CLOTH 2 % EX PADS
6.0000 | MEDICATED_PAD | Freq: Once | CUTANEOUS | Status: AC
  Administered 2020-02-20: 6 via TOPICAL

## 2020-02-20 MED ORDER — SODIUM CHLORIDE 0.9 % IV SOLN
INTRAVENOUS | Status: DC
  Filled 2020-02-20: qty 30

## 2020-02-20 MED ORDER — METOPROLOL TARTRATE 12.5 MG HALF TABLET
12.5000 mg | ORAL_TABLET | Freq: Once | ORAL | Status: DC
  Filled 2020-02-20: qty 1

## 2020-02-20 MED ORDER — VANCOMYCIN HCL 1250 MG/250ML IV SOLN
1250.0000 mg | INTRAVENOUS | Status: AC
  Administered 2020-02-21: 1250 mg via INTRAVENOUS
  Filled 2020-02-20: qty 250

## 2020-02-20 MED ORDER — PLASMA-LYTE 148 IV SOLN
INTRAVENOUS | Status: DC
  Filled 2020-02-20: qty 2.5

## 2020-02-20 MED ORDER — CHLORHEXIDINE GLUCONATE CLOTH 2 % EX PADS: 6.0000 | MEDICATED_PAD | Freq: Once | CUTANEOUS | Status: DC

## 2020-02-20 MED ORDER — BISACODYL 5 MG PO TBEC
5.0000 mg | DELAYED_RELEASE_TABLET | Freq: Once | ORAL | Status: AC
  Administered 2020-02-20: 19:00:00 5 mg via ORAL
  Filled 2020-02-20: qty 1

## 2020-02-20 MED ORDER — MANNITOL 20 % IV SOLN
INTRAVENOUS | Status: DC
  Filled 2020-02-20: qty 13

## 2020-02-20 MED ORDER — SODIUM CHLORIDE 0.9 % IV SOLN
750.0000 mg | INTRAVENOUS | Status: AC
  Administered 2020-02-21: 750 mg via INTRAVENOUS
  Filled 2020-02-20: qty 750

## 2020-02-20 MED ORDER — DEXMEDETOMIDINE HCL IN NACL 400 MCG/100ML IV SOLN
0.1000 ug/kg/h | INTRAVENOUS | Status: AC
  Administered 2020-02-21: .5 ug/kg/h via INTRAVENOUS
  Filled 2020-02-20: qty 100

## 2020-02-20 MED ORDER — MORPHINE SULFATE (PF) 2 MG/ML IV SOLN
2.0000 mg | Freq: Once | INTRAVENOUS | Status: AC
  Administered 2020-02-20: 2 mg via INTRAVENOUS

## 2020-02-20 MED ORDER — POTASSIUM CHLORIDE 2 MEQ/ML IV SOLN
80.0000 meq | INTRAVENOUS | Status: DC
  Filled 2020-02-20: qty 40

## 2020-02-20 NOTE — H&P (Signed)
H&P Addendum, precardiac catheterization  Patient was seen and evaluated prior to Cardiac catheterization procedure Symptoms, prior testing details again confirmed with the patient Patient examined, no significant change from prior exam Lab work reviewed in detail personally by myself Patient understands risk and benefit of the procedure, willing to proceed  Signed, Tim Jannelle Notaro, MD, Ph.D [facility]    

## 2020-02-20 NOTE — Progress Notes (Signed)
Progress Note  Patient Name: Felicia [name] Date of Encounter: 02/20/2020  Primary Cardiologist:   Ida Rogue, MD   Subjective   Chest pain started about 1/2 hour ago while ambulating from the bathroom/sink.  6/10.  Feels like previous angina  Inpatient Medications    Scheduled Meds: . aspirin EC  81 mg Oral Daily  . [START ON 02/21/2020] epinephrine  0-10 mcg/min Intravenous To OR  . esomeprazole  40 mg Oral Daily  . ezetimibe  10 mg Oral Daily  . heparin  5,000 Units Subcutaneous Q8H  . [START ON 02/21/2020] heparin-papaverine-plasmalyte irrigation   Irrigation To OR  . insulin aspart  0-2 Units Subcutaneous QHS  . insulin aspart  0-4 Units Subcutaneous BID  . insulin aspart  0-5 Units Subcutaneous TID WC  . insulin aspart  0-7 Units Subcutaneous BID WC  . insulin glargine  8 Units Subcutaneous QHS   And  . insulin glargine  10 Units Subcutaneous Daily  . [START ON 02/21/2020] insulin   Intravenous To OR  . [START ON 02/21/2020] [place] Blood Cardioplegia vial (lidocaine/magnesium/mannitol 0.26g-4g-6.4g)   Intracoronary To OR  . [START ON 02/21/2020] magnesium sulfate  40 mEq Other To OR  . metoprolol tartrate  12.5 mg Oral BID  . multivitamin with minerals  1 tablet Oral Daily  . [START ON 02/21/2020] phenylephrine  30-200 mcg/min Intravenous To OR  . [START ON 02/21/2020] potassium chloride  80 mEq Other To OR  . pravastatin  40 mg Oral q1800  . sodium chloride flush  10-40 mL Intracatheter Q12H  . sodium chloride flush  3 mL Intravenous Q12H  . [START ON 02/21/2020] tranexamic acid  15 mg/kg Intravenous To OR  . [START ON 02/21/2020] tranexamic acid  2 mg/kg Intracatheter To OR   Continuous Infusions: . sodium chloride    . [START ON 02/21/2020] cefUROXime (ZINACEF)  IV    . [START ON 02/21/2020] cefUROXime (ZINACEF)  IV    . [START ON 02/21/2020] dexmedetomidine    . [START ON 02/21/2020] heparin 30,000 units/NS 1000 mL solution for CELLSAVER    . [START ON  02/21/2020] milrinone    . [START ON 02/21/2020] nitroGLYCERIN    . [START ON 02/21/2020] norepinephrine    . [START ON 02/21/2020] tranexamic acid (CYKLOKAPRON) infusion ([facility])    . [START ON 02/21/2020] vancomycin     PRN Meds: sodium chloride, acetaminophen, nitroGLYCERIN, ondansetron (ZOFRAN) IV, sodium chloride flush, sodium chloride flush   Vital Signs    Vitals:   02/19/20 1607 02/19/20 2147 02/19/20 2211 02/20/20 0626  BP: 110/63 (!) 146/70 138/65 (!) 156/68  Pulse: 68 72 70 70  Resp: 18 18  18   Temp: 98.2 F (36.8 C) 98.8 F (37.1 C)  98.4 F (36.9 C)  TempSrc: Oral Oral  Oral  SpO2: 100% 100%  99%  Weight:    60.7 kg    Intake/Output Summary (Last 24 hours) at 02/20/2020 1228 Last data filed at 02/20/2020 1000 Gross per 24 hour  Intake 20 ml  Output --  Net 20 ml   Filed Weights   02/18/20 0409 02/19/20 0508 02/20/20 0626  Weight: 61.5 kg 62 kg 60.7 kg    Telemetry    NSR - Personally Reviewed  ECG    EKG pending- Personally Reviewed  Physical Exam   GEN: No  acute distress.   Neck: No  JVD Cardiac: RRR, no murmurs, rubs, or gallops.  Respiratory: Clear   to auscultation bilaterally. GI: Soft, nontender,  non-distended, normal bowel sounds  MS:  No edema; No deformity. Neuro:   Nonfocal  Psych: Oriented and appropriate    Labs    Chemistry Recent Labs  Lab 02/17/20 1816 02/18/20 0544 02/20/20 0737  NA  --  137 136  K  --  4.3 4.2  CL  --  103 103  CO2  --  25 23  GLUCOSE  --  269* 256*  BUN  --  18 18  CREATININE 0.98 0.91 0.95  CALCIUM  --  9.0 8.8*  PROT  --   --  6.3*  ALBUMIN  --   --  3.3*  AST  --   --  19  ALT  --   --  14  ALKPHOS  --   --  57  BILITOT  --   --  0.7  GFRNONAA >60 >60 >60  GFRAA >60 >60 >60  ANIONGAP  --  9 10     Hematology Recent Labs  Lab 02/18/20 0544 02/19/20 0452 02/20/20 0737  WBC 5.5 5.4 5.0  RBC 4.33 4.30 4.16  HGB 12.6 12.2 12.1  HCT 38.6 38.2 37.1  MCV 89.1 88.8 89.2  MCH 29.1 28.4 29.1   MCHC 32.6 31.9 32.6  RDW 13.2 13.2 13.0  PLT 260 272 220    Cardiac EnzymesNo results for input(s): TROPONINI in the last 168 hours. No results for input(s): TROPIPOC in the last 168 hours.   BNPNo results for input(s): BNP, PROBNP in the last 168 hours.   DDimer No results for input(s): DDIMER in the last 168 hours.   Radiology    VAS US DOPPLER PRE CABG  Result Date: 02/18/2020 PREOPERATIVE VASCULAR EVALUATION  Indications:      Pre-CABG. Risk Factors:     Hypertension, Diabetes, coronary artery disease. Comparison Study: no prior Performing Technologist: Blanch Media RVS  Examination Guidelines: A complete evaluation includes B-mode imaging, spectral Doppler, color Doppler, and power Doppler as needed of all accessible portions of each vessel. Bilateral testing is considered an integral part of a complete examination. Limited examinations for reoccurring indications may be performed as noted.  Right Carotid Findings: +----------+--------+--------+--------+------------+--------+           PSV cm/sEDV cm/sStenosisDescribe    Comments +----------+--------+--------+--------+------------+--------+ CCA Prox  86      17              heterogenous         +----------+--------+--------+--------+------------+--------+ CCA Distal58      19              heterogenous         +----------+--------+--------+--------+------------+--------+ ICA Prox  57      18      1-39%   heterogenous         +----------+--------+--------+--------+------------+--------+ ICA Distal49      19                                   +----------+--------+--------+--------+------------+--------+ ECA       80      10                                   +----------+--------+--------+--------+------------+--------+ Portions of this table do not appear on this page. +----------+--------+-------+--------+------------+           PSV cm/sEDV cmsDescribeArm Pressure  +----------+--------+-------+--------+------------+  Subclavian78                                  +----------+--------+-------+--------+------------+ +---------+--------+--+--------+-+---------+ VertebralPSV cm/s36EDV cm/s9Antegrade +---------+--------+--+--------+-+---------+ Left Carotid Findings: +----------+--------+--------+--------+------------+--------+           PSV cm/sEDV cm/sStenosisDescribe    Comments +----------+--------+--------+--------+------------+--------+ CCA Prox  98      24              heterogenous         +----------+--------+--------+--------+------------+--------+ CCA Distal55      19              heterogenous         +----------+--------+--------+--------+------------+--------+ ICA Prox  59      19      1-39%   heterogenous         +----------+--------+--------+--------+------------+--------+ ICA Distal81      22                                   +----------+--------+--------+--------+------------+--------+ ECA       64      10                                   +----------+--------+--------+--------+------------+--------+ +----------+--------+--------+--------+------------+ SubclavianPSV cm/sEDV cm/sDescribeArm Pressure +----------+--------+--------+--------+------------+           96                      145          +----------+--------+--------+--------+------------+ +---------+--------+--+--------+--+---------+ VertebralPSV cm/s40EDV cm/s12Antegrade +---------+--------+--+--------+--+---------+  ABI Findings: +--------+------------------+-----+---------+--------+ Right   Rt Pressure (mmHg)IndexWaveform Comment  +--------+------------------+-----+---------+--------+ Brachial                       triphasiciv       +--------+------------------+-----+---------+--------+ PTA     146               1.01 triphasic         +--------+------------------+-----+---------+--------+ DP      156                1.08 triphasic         +--------+------------------+-----+---------+--------+ +--------+------------------+-----+---------+-------+ Left    Lt Pressure (mmHg)IndexWaveform Comment +--------+------------------+-----+---------+-------+ [id number]                    triphasic        +--------+------------------+-----+---------+-------+ PTA     131               0.90 triphasic        +--------+------------------+-----+---------+-------+ DP      154               1.06 triphasic        +--------+------------------+-----+---------+-------+ +-------+---------------+----------------+ ABI/TBIToday's ABI/TBIPrevious ABI/TBI +-------+---------------+----------------+ Right  1.08                            +-------+---------------+----------------+ Left   1.06                            +-------+---------------+----------------+  Right Doppler Findings: +--------+--------+-----+---------+--------+ Site    PressureIndexDoppler  Comments +--------+--------+-----+---------+--------+ Brachial  triphasiciv       +--------+--------+-----+---------+--------+ Radial               triphasic         +--------+--------+-----+---------+--------+ Ulnar                triphasic         +--------+--------+-----+---------+--------+  Left Doppler Findings: +--------+--------+-----+---------+--------+ Site    PressureIndexDoppler  Comments +--------+--------+-----+---------+--------+ [id number]          triphasic         +--------+--------+-----+---------+--------+ Radial               triphasic         +--------+--------+-----+---------+--------+ Ulnar                triphasic         +--------+--------+-----+---------+--------+  Summary: Right Carotid: Velocities in the right ICA are consistent with a 1-39% stenosis. Left Carotid: Velocities in the left ICA are consistent with a 1-39% stenosis. Vertebrals: Bilateral vertebral arteries  demonstrate antegrade flow. Right ABI: Resting right ankle-brachial index is within normal range. No evidence of significant right lower extremity arterial disease. Left ABI: Resting left ankle-brachial index is within normal range. No evidence of significant left lower extremity arterial disease. Right Upper Extremity: Doppler waveforms remain within normal limits with right radial compression. Doppler waveforms remain within normal limits with right ulnar compression. Left Upper Extremity: Doppler waveform obliterate with left radial compression. Doppler waveforms remain within normal limits with left ulnar compression.  Electronically signed by Sherald Hess MD on 02/18/2020 at 4:04:18 PM.    Final     Cardiac Studies   Cath: 02/17/20   1st Mrg lesion is 60% stenosed.  Prox LAD to Mid LAD lesion is 90% stenosed.  Mid LAD-1 lesion is 80% stenosed.  Mid LAD-2 lesion is 50% stenosed.  1st Diag-1 lesion is 95% stenosed.  1st Diag-2 lesion is 70% stenosed.  Ost RCA to Prox RCA lesion is 70% stenosed.  Mid RCA lesion is 70% stenosed.  RPDA lesion is 50% stenosed.  The left ventricular ejection fraction is greater than 65% by visual estimate.  LV end diastolic pressure is normal.  The left ventricular systolic function is normal.  There is no mitral valve regurgitation.  Diagnostic Dominance: Right   Echo: 02/17/20  IMPRESSIONS    1. Left ventricular ejection fraction, by estimation, is 65 to 70%. The  left ventricle has normal function. The left ventricle has no regional  wall motion abnormalities. Left ventricular diastolic parameters are  consistent with Grade I diastolic  dysfunction (impaired relaxation).  2. Right ventricular systolic function is normal. The right ventricular  size is normal.  3. The mitral valve is normal in structure. Mild mitral valve  regurgitation. No evidence of mitral stenosis.  4. The aortic valve is normal in structure. Aortic  valve regurgitation is  not visualized. Mild to moderate aortic valve sclerosis/calcification is  present, without any evidence of aortic stenosis.  5. The inferior vena cava is normal in size with greater than 50%  respiratory variability, suggesting right atrial pressure of 3 mmHg.   Patient Profile     59 y.o. female with a history of type 1 diabetes, hypertension, hyperlipidemia, gastroparesis/irritable bowel syndrome, coronary and aortic atherosclerosis previously seen on CT, and myelopathy, who was recently evaluated in outpatient secondary unstable angina underwent diagnostic catheterization April 15 revealing moderate to severe multivessel disease prompting transfer to [facility] for further CT surgical and interventional  evaluation.  Assessment & Plan    UNSTABLE ANGINA:  CABG on Monday.  Currently with acute pain.  Checking EKG.  NTG given SL. Will give morphine.  She might need to be restarted on heparin.  Check stat EKG.    HTN:  BP at target. Continue current therapy.   DYSLIPIDEMIA:  She will start PCSK9 as an out patient.    DM:  She sees Dr. Elvera Lennox.  SSI.  BPs still intermittently elevated since admission.    For questions or updates, please contact [facility] Please consult [email or web address] for contact info under Cardiology/STEMI.   Signed, Rollene Rotunda, MD  02/20/2020, 12:28 PM

## 2020-02-20 NOTE — Progress Notes (Signed)
Ordered ABG on room air per protocol. Educated patient that ABG was ordered as a pre-surgery blood test per orders and that RN could not obtain this level from midline catheter due to it being an arterial test. Patient became very anxious about the thought of this and has very severe needle phobia. Patient refusing ABG at this time; will notify respiratory therapy of this.

## 2020-02-20 NOTE — Progress Notes (Signed)
     [address]       Felicia [name] [address]             [phone]       Some chest pain today Studies, and labs reviewed OR tomorrow for CABG 4

## 2020-02-20 NOTE — Anesthesia Preprocedure Evaluation (Addendum)
Anesthesia Evaluation  Patient identified by MRN, date of birth, ID band Patient awake    Reviewed: Allergy & Precautions, NPO status , Patient's Chart, lab work & pertinent test results  Airway Mallampati: I  TM Distance: >3 FB Neck ROM: Full    Dental  (+) Edentulous Upper   Pulmonary neg pulmonary ROS,    Pulmonary exam normal breath sounds clear to auscultation       Cardiovascular hypertension, Pt. on medications + CAD  Normal cardiovascular exam Rhythm:Regular Rate:Normal  ECG: NSR, rate 80  ECHO: 1. Left ventricular ejection fraction, by estimation, is 65 to 70%. The left ventricle has normal function. The left ventricle has no regional wall motion abnormalities. Left ventricular diastolic parameters are consistent with Grade I diastolic dysfunction (impaired relaxation). 2. Right ventricular systolic function is normal. The right ventricular size is normal. 3. The mitral valve is normal in structure. Mild mitral valve regurgitation. No evidence of mitral stenosis. 4. The aortic valve is normal in structure. Aortic valve regurgitation is not visualized. Mild to moderate aortic valve sclerosis/calcification is present, without any evidence of aortic stenosis. 5. The inferior vena cava is normal in size with greater than 50% respiratory variability, suggesting right atrial pressure of 3 mmHg.  CATH: 1st Mrg lesion is 60% stenosed. Prox LAD to Mid LAD lesion is 90% stenosed. Mid LAD-1 lesion is 80% stenosed. Mid LAD-2 lesion is 50% stenosed. 1st Diag-1 lesion is 95% stenosed. 1st Diag-2 lesion is 70% stenosed. Ost RCA to Prox RCA lesion is 70% stenosed. Mid RCA lesion is 70% stenosed. RPDA lesion is 50% stenosed. The left ventricular ejection fraction is greater than 65% by visual estimate. LV end diastolic pressure is normal. The left ventricular systolic function is normal. There is no mitral valve  regurgitation.   Neuro/Psych negative neurological ROS  negative psych ROS   GI/Hepatic Neg liver ROS, GERD  Medicated and Controlled,IBS (irritable bowel syndrome   Endo/Other  diabetes, Insulin Dependent  Renal/GU Renal InsufficiencyRenal disease     Musculoskeletal negative musculoskeletal ROS (+)   Abdominal   Peds  Hematology HLD   Anesthesia Other Findings CAD  Reproductive/Obstetrics                            Anesthesia Physical Anesthesia Plan  ASA: IV  Anesthesia Plan: General   Post-op Pain Management:    Induction: Intravenous  PONV Risk Score and Plan: 3 and Midazolam, Ondansetron, Dexamethasone and Treatment may vary due to age or medical condition  Airway Management Planned: Oral ETT  Additional Equipment: Arterial line, CVP, TEE and Ultrasound Guidance Line Placement  Intra-op Plan:   Post-operative Plan: Post-operative intubation/ventilation  Informed Consent: I have reviewed the patients History and Physical, chart, labs and discussed the procedure including the risks, benefits and alternatives for the proposed anesthesia with the patient or authorized representative who has indicated his/her understanding and acceptance.     Dental advisory given  Plan Discussed with: CRNA  Anesthesia Plan Comments:        Anesthesia Quick Evaluation

## 2020-02-21 ENCOUNTER — Inpatient Hospital Stay (HOSPITAL_COMMUNITY): Payer: BC Managed Care – PPO

## 2020-02-21 ENCOUNTER — Inpatient Hospital Stay (HOSPITAL_COMMUNITY): Payer: BC Managed Care – PPO | Admitting: Certified Registered Nurse Anesthetist

## 2020-02-21 ENCOUNTER — Inpatient Hospital Stay (HOSPITAL_COMMUNITY)
Admission: AD | Disposition: A | Discharge status: Home / Self Care | Payer: Self-pay | Source: Other Acute Inpatient Hospital | Attending: Cardiovascular Disease

## 2020-02-21 ENCOUNTER — Telehealth: Payer: Self-pay | Admitting: Cardiovascular Disease

## 2020-02-21 DIAGNOSIS — E1043 Type 1 diabetes mellitus with diabetic autonomic (poly)neuropathy: Secondary | ICD-10-CM | POA: Diagnosis not present

## 2020-02-21 DIAGNOSIS — I25110 Atherosclerotic heart disease of native coronary artery with unstable angina pectoris: Secondary | ICD-10-CM | POA: Diagnosis not present

## 2020-02-21 DIAGNOSIS — I10 Essential (primary) hypertension: Secondary | ICD-10-CM | POA: Diagnosis not present

## 2020-02-21 DIAGNOSIS — Z951 Presence of aortocoronary bypass graft: Secondary | ICD-10-CM

## 2020-02-21 DIAGNOSIS — I340 Nonrheumatic mitral (valve) insufficiency: Secondary | ICD-10-CM | POA: Diagnosis not present

## 2020-02-21 DIAGNOSIS — Z794 Long term (current) use of insulin: Secondary | ICD-10-CM | POA: Diagnosis not present

## 2020-02-21 DIAGNOSIS — D62 Acute posthemorrhagic anemia: Secondary | ICD-10-CM | POA: Diagnosis not present

## 2020-02-21 DIAGNOSIS — E785 Hyperlipidemia, unspecified: Secondary | ICD-10-CM | POA: Diagnosis not present

## 2020-02-21 DIAGNOSIS — Z01818 Encounter for other preprocedural examination: Secondary | ICD-10-CM | POA: Diagnosis not present

## 2020-02-21 HISTORY — PX: CORONARY ARTERY BYPASS GRAFT: SHX141

## 2020-02-21 HISTORY — PX: TEE WITHOUT CARDIOVERSION: SHX5443

## 2020-02-21 LAB — POCT I-STAT, CHEM 8
BUN: 19 mg/dL (ref 6–20)
BUN: 16 mg/dL (ref 6–20)
BUN: 15 mg/dL (ref 6–20)
BUN: 16 mg/dL (ref 6–20)
BUN: 16 mg/dL (ref 6–20)
Calcium, Ion: 1.21 mmol/L (ref 1.15–1.40)
Calcium, Ion: 1.19 mmol/L (ref 1.15–1.40)
Calcium, Ion: 1.02 mmol/L — ABNORMAL LOW (ref 1.15–1.40)
Calcium, Ion: 1 mmol/L — ABNORMAL LOW (ref 1.15–1.40)
Calcium, Ion: 1.02 mmol/L — ABNORMAL LOW (ref 1.15–1.40)
Chloride: 102 mmol/L (ref 98–111)
Chloride: 105 mmol/L (ref 98–111)
Chloride: 104 mmol/L (ref 98–111)
Chloride: 101 mmol/L (ref 98–111)
Chloride: 102 mmol/L (ref 98–111)
Creatinine, Ser: 0.7 mg/dL (ref 0.44–1.00)
Creatinine, Ser: 0.6 mg/dL (ref 0.44–1.00)
Creatinine, Ser: 0.5 mg/dL (ref 0.44–1.00)
Creatinine, Ser: 0.6 mg/dL (ref 0.44–1.00)
Creatinine, Ser: 0.6 mg/dL (ref 0.44–1.00)
Glucose, Bld: 181 mg/dL — ABNORMAL HIGH (ref 70–99)
Glucose, Bld: 136 mg/dL — ABNORMAL HIGH (ref 70–99)
Glucose, Bld: 99 mg/dL (ref 70–99)
Glucose, Bld: 109 mg/dL — ABNORMAL HIGH (ref 70–99)
Glucose, Bld: 115 mg/dL — ABNORMAL HIGH (ref 70–99)
HCT: 35 % — ABNORMAL LOW (ref 36.0–46.0)
HCT: 32 % — ABNORMAL LOW (ref 36.0–46.0)
HCT: 21 % — ABNORMAL LOW (ref 36.0–46.0)
HCT: 26 % — ABNORMAL LOW (ref 36.0–46.0)
HCT: 29 % — ABNORMAL LOW (ref 36.0–46.0)
Hemoglobin: 11.9 g/dL — ABNORMAL LOW (ref 12.0–15.0)
Hemoglobin: 10.9 g/dL — ABNORMAL LOW (ref 12.0–15.0)
Hemoglobin: 7.1 g/dL — ABNORMAL LOW (ref 12.0–15.0)
Hemoglobin: 8.8 g/dL — ABNORMAL LOW (ref 12.0–15.0)
Hemoglobin: 9.9 g/dL — ABNORMAL LOW (ref 12.0–15.0)
Potassium: 4.5 mmol/L (ref 3.5–5.1)
Potassium: 3.7 mmol/L (ref 3.5–5.1)
Potassium: 4.5 mmol/L (ref 3.5–5.1)
Potassium: 4.5 mmol/L (ref 3.5–5.1)
Potassium: 4.8 mmol/L (ref 3.5–5.1)
Sodium: 140 mmol/L (ref 135–145)
Sodium: 139 mmol/L (ref 135–145)
Sodium: 140 mmol/L (ref 135–145)
Sodium: 136 mmol/L (ref 135–145)
Sodium: 138 mmol/L (ref 135–145)
TCO2: 28 mmol/L (ref 22–32)
TCO2: 24 mmol/L (ref 22–32)
TCO2: 28 mmol/L (ref 22–32)
TCO2: 27 mmol/L (ref 22–32)
TCO2: 28 mmol/L (ref 22–32)

## 2020-02-21 LAB — POCT I-STAT 7, (LYTES, BLD GAS, ICA,H+H)
Acid-Base Excess: 3 mmol/L — ABNORMAL HIGH (ref 0.0–2.0)
Acid-Base Excess: 2 mmol/L (ref 0.0–2.0)
Acid-base deficit: 1 mmol/L (ref 0.0–2.0)
Acid-base deficit: 4 mmol/L — ABNORMAL HIGH (ref 0.0–2.0)
Acid-base deficit: 5 mmol/L — ABNORMAL HIGH (ref 0.0–2.0)
Acid-base deficit: 3 mmol/L — ABNORMAL HIGH (ref 0.0–2.0)
Bicarbonate: 26.1 mmol/L (ref 20.0–28.0)
Bicarbonate: 26.4 mmol/L (ref 20.0–28.0)
Bicarbonate: 21.7 mmol/L (ref 20.0–28.0)
Bicarbonate: 21.1 mmol/L (ref 20.0–28.0)
Bicarbonate: 20.7 mmol/L (ref 20.0–28.0)
Bicarbonate: 23.3 mmol/L (ref 20.0–28.0)
Calcium, Ion: 0.91 mmol/L — ABNORMAL LOW (ref 1.15–1.40)
Calcium, Ion: 1.02 mmol/L — ABNORMAL LOW (ref 1.15–1.40)
Calcium, Ion: 1.21 mmol/L (ref 1.15–1.40)
Calcium, Ion: 1.11 mmol/L — ABNORMAL LOW (ref 1.15–1.40)
Calcium, Ion: 1.16 mmol/L (ref 1.15–1.40)
Calcium, Ion: 1.21 mmol/L (ref 1.15–1.40)
HCT: 23 % — ABNORMAL LOW (ref 36.0–46.0)
HCT: 28 % — ABNORMAL LOW (ref 36.0–46.0)
HCT: 31 % — ABNORMAL LOW (ref 36.0–46.0)
HCT: 30 % — ABNORMAL LOW (ref 36.0–46.0)
HCT: 30 % — ABNORMAL LOW (ref 36.0–46.0)
HCT: 34 % — ABNORMAL LOW (ref 36.0–46.0)
Hemoglobin: 7.8 g/dL — ABNORMAL LOW (ref 12.0–15.0)
Hemoglobin: 9.5 g/dL — ABNORMAL LOW (ref 12.0–15.0)
Hemoglobin: 10.5 g/dL — ABNORMAL LOW (ref 12.0–15.0)
Hemoglobin: 10.2 g/dL — ABNORMAL LOW (ref 12.0–15.0)
Hemoglobin: 10.2 g/dL — ABNORMAL LOW (ref 12.0–15.0)
Hemoglobin: 11.6 g/dL — ABNORMAL LOW (ref 12.0–15.0)
O2 Saturation: 100 %
O2 Saturation: 100 %
O2 Saturation: 98 %
O2 Saturation: 99 %
O2 Saturation: 88 %
O2 Saturation: 100 %
Patient temperature: 36.9
Patient temperature: 38.1
Patient temperature: 38.4
Potassium: 3.8 mmol/L (ref 3.5–5.1)
Potassium: 4.6 mmol/L (ref 3.5–5.1)
Potassium: 3.1 mmol/L — ABNORMAL LOW (ref 3.5–5.1)
Potassium: 3.9 mmol/L (ref 3.5–5.1)
Potassium: 4 mmol/L (ref 3.5–5.1)
Potassium: 4.3 mmol/L (ref 3.5–5.1)
Sodium: 140 mmol/L (ref 135–145)
Sodium: 138 mmol/L (ref 135–145)
Sodium: 142 mmol/L (ref 135–145)
Sodium: 144 mmol/L (ref 135–145)
Sodium: 143 mmol/L (ref 135–145)
Sodium: 141 mmol/L (ref 135–145)
TCO2: 27 mmol/L (ref 22–32)
TCO2: 28 mmol/L (ref 22–32)
TCO2: 23 mmol/L (ref 22–32)
TCO2: 22 mmol/L (ref 22–32)
TCO2: 22 mmol/L (ref 22–32)
TCO2: 25 mmol/L (ref 22–32)
pCO2 arterial: 33 mmHg (ref 32.0–48.0)
pCO2 arterial: 37.2 mmHg (ref 32.0–48.0)
pCO2 arterial: 28.8 mmHg — ABNORMAL LOW (ref 32.0–48.0)
pCO2 arterial: 38.7 mmHg (ref 32.0–48.0)
pCO2 arterial: 43.3 mmHg (ref 32.0–48.0)
pCO2 arterial: 46.7 mmHg (ref 32.0–48.0)
pH, Arterial: 7.507 — ABNORMAL HIGH (ref 7.350–7.450)
pH, Arterial: 7.458 — ABNORMAL HIGH (ref 7.350–7.450)
pH, Arterial: 7.485 — ABNORMAL HIGH (ref 7.350–7.450)
pH, Arterial: 7.344 — ABNORMAL LOW (ref 7.350–7.450)
pH, Arterial: 7.292 — ABNORMAL LOW (ref 7.350–7.450)
pH, Arterial: 7.314 — ABNORMAL LOW (ref 7.350–7.450)
pO2, Arterial: 374 mmHg — ABNORMAL HIGH (ref 83.0–108.0)
pO2, Arterial: 358 mmHg — ABNORMAL HIGH (ref 83.0–108.0)
pO2, Arterial: 94 mmHg (ref 83.0–108.0)
pO2, Arterial: 165 mmHg — ABNORMAL HIGH (ref 83.0–108.0)
pO2, Arterial: 65 mmHg — ABNORMAL LOW (ref 83.0–108.0)
pO2, Arterial: 189 mmHg — ABNORMAL HIGH (ref 83.0–108.0)

## 2020-02-21 LAB — CBC
HCT: 38.4 % (ref 36.0–46.0)
HCT: 31.8 % — ABNORMAL LOW (ref 36.0–46.0)
HCT: 34.5 % — ABNORMAL LOW (ref 36.0–46.0)
Hemoglobin: 12.4 g/dL (ref 12.0–15.0)
Hemoglobin: 10.5 g/dL — ABNORMAL LOW (ref 12.0–15.0)
Hemoglobin: 11.4 g/dL — ABNORMAL LOW (ref 12.0–15.0)
MCH: 29 pg (ref 26.0–34.0)
MCH: 28.8 pg (ref 26.0–34.0)
MCH: 29.2 pg (ref 26.0–34.0)
MCHC: 32.3 g/dL (ref 30.0–36.0)
MCHC: 33 g/dL (ref 30.0–36.0)
MCHC: 33 g/dL (ref 30.0–36.0)
MCV: 89.7 fL (ref 80.0–100.0)
MCV: 87.1 fL (ref 80.0–100.0)
MCV: 88.2 fL (ref 80.0–100.0)
Platelets: 291 10*3/uL (ref 150–400)
Platelets: 99 10*3/uL — ABNORMAL LOW (ref 150–400)
Platelets: 155 10*3/uL (ref 150–400)
RBC: 4.28 MIL/uL (ref 3.87–5.11)
RBC: 3.65 MIL/uL — ABNORMAL LOW (ref 3.87–5.11)
RBC: 3.91 MIL/uL (ref 3.87–5.11)
RDW: 13.2 % (ref 11.5–15.5)
RDW: 13.1 % (ref 11.5–15.5)
RDW: 13.8 % (ref 11.5–15.5)
WBC: 5.8 10*3/uL (ref 4.0–10.5)
WBC: 6.2 10*3/uL (ref 4.0–10.5)
WBC: 11.7 10*3/uL — ABNORMAL HIGH (ref 4.0–10.5)
nRBC: 0 % (ref 0.0–0.2)
nRBC: 0 % (ref 0.0–0.2)
nRBC: 0 % (ref 0.0–0.2)

## 2020-02-21 LAB — GLUCOSE, CAPILLARY
Glucose-Capillary: 100 mg/dL — ABNORMAL HIGH (ref 70–99)
Glucose-Capillary: 108 mg/dL — ABNORMAL HIGH (ref 70–99)
Glucose-Capillary: 111 mg/dL — ABNORMAL HIGH (ref 70–99)
Glucose-Capillary: 117 mg/dL — ABNORMAL HIGH (ref 70–99)
Glucose-Capillary: 117 mg/dL — ABNORMAL HIGH (ref 70–99)
Glucose-Capillary: 118 mg/dL — ABNORMAL HIGH (ref 70–99)
Glucose-Capillary: 122 mg/dL — ABNORMAL HIGH (ref 70–99)
Glucose-Capillary: 124 mg/dL — ABNORMAL HIGH (ref 70–99)
Glucose-Capillary: 129 mg/dL — ABNORMAL HIGH (ref 70–99)
Glucose-Capillary: 69 mg/dL — ABNORMAL LOW (ref 70–99)
Glucose-Capillary: 86 mg/dL (ref 70–99)

## 2020-02-21 LAB — HEMOGLOBIN AND HEMATOCRIT, BLOOD
HCT: 29.8 % — ABNORMAL LOW (ref 36.0–46.0)
Hemoglobin: 9.7 g/dL — ABNORMAL LOW (ref 12.0–15.0)

## 2020-02-21 LAB — BASIC METABOLIC PANEL
Anion gap: 10 (ref 5–15)
Anion gap: 8 (ref 5–15)
BUN: 19 mg/dL (ref 6–20)
BUN: 13 mg/dL (ref 6–20)
CO2: 26 mmol/L (ref 22–32)
CO2: 23 mmol/L (ref 22–32)
Calcium: 9.3 mg/dL (ref 8.9–10.3)
Calcium: 8.3 mg/dL — ABNORMAL LOW (ref 8.9–10.3)
Chloride: 104 mmol/L (ref 98–111)
Chloride: 109 mmol/L (ref 98–111)
Creatinine, Ser: 1.03 mg/dL — ABNORMAL HIGH (ref 0.44–1.00)
Creatinine, Ser: 0.93 mg/dL (ref 0.44–1.00)
GFR calc Af Amer: >60 mL/min (ref >60)
GFR calc Af Amer: >60 mL/min (ref >60)
GFR calc non Af Amer: >60 mL/min (ref >60)
GFR calc non Af Amer: >60 mL/min (ref >60)
Glucose, Bld: 90 mg/dL (ref 70–99)
Glucose, Bld: 112 mg/dL — ABNORMAL HIGH (ref 70–99)
Potassium: 4.5 mmol/L (ref 3.5–5.1)
Potassium: 4.3 mmol/L (ref 3.5–5.1)
Sodium: 140 mmol/L (ref 135–145)
Sodium: 140 mmol/L (ref 135–145)

## 2020-02-21 LAB — PREPARE RBC (CROSSMATCH)

## 2020-02-21 LAB — ECHO INTRAOPERATIVE TEE
Height: 64 in
Weight: 2120 oz

## 2020-02-21 LAB — APTT: aPTT: 33 seconds (ref 24–36)

## 2020-02-21 LAB — MAGNESIUM: Magnesium: 3 mg/dL — ABNORMAL HIGH (ref 1.7–2.4)

## 2020-02-21 LAB — PROTIME-INR
INR: 1.5 — ABNORMAL HIGH (ref 0.8–1.2)
Prothrombin Time: 17.5 seconds — ABNORMAL HIGH (ref 11.4–15.2)

## 2020-02-21 LAB — PLATELET COUNT: Platelets: 145 10*3/uL — ABNORMAL LOW (ref 150–400)

## 2020-02-21 SURGERY — CORONARY ARTERY BYPASS GRAFTING (CABG)
Anesthesia: General | Site: Esophagus

## 2020-02-21 MED ORDER — HEPARIN SODIUM (PORCINE) 1000 UNIT/ML IJ SOLN
INTRAMUSCULAR | Status: DC | PRN
  Administered 2020-02-21: 20000 [IU] via INTRAVENOUS

## 2020-02-21 MED ORDER — SODIUM CHLORIDE 0.45 % IV SOLN: INTRAVENOUS | Status: DC | PRN

## 2020-02-21 MED ORDER — NICARDIPINE HCL IN NACL 20-0.86 MG/200ML-% IV SOLN
3.0000 mg/h | INTRAVENOUS | Status: DC
  Administered 2020-02-21: 5 mg/h via INTRAVENOUS
  Filled 2020-02-21: qty 200

## 2020-02-21 MED ORDER — PROPOFOL 10 MG/ML IV BOLUS
INTRAVENOUS | Status: AC
  Filled 2020-02-21: qty 20

## 2020-02-21 MED ORDER — FAMOTIDINE 20 MG PO TABS
20.0000 mg | ORAL_TABLET | Freq: Two times a day (BID) | ORAL | Status: DC
  Administered 2020-02-23: 20 mg via ORAL
  Filled 2020-02-21: qty 1

## 2020-02-21 MED ORDER — EZETIMIBE 10 MG PO TABS
10.0000 mg | ORAL_TABLET | Freq: Every day | ORAL | Status: DC
  Administered 2020-02-22 – 2020-02-24 (×3): 10 mg via ORAL
  Filled 2020-02-21 (×3): qty 1

## 2020-02-21 MED ORDER — POTASSIUM CHLORIDE 10 MEQ/50ML IV SOLN
10.0000 meq | INTRAVENOUS | Status: AC
  Administered 2020-02-21 (×3): 10 meq via INTRAVENOUS
  Filled 2020-02-21: qty 50

## 2020-02-21 MED ORDER — METOCLOPRAMIDE HCL 5 MG/ML IJ SOLN
5.0000 mg | Freq: Once | INTRAMUSCULAR | Status: AC
  Administered 2020-02-21: 06:00:00 5 mg via INTRAVENOUS
  Filled 2020-02-21: qty 2

## 2020-02-21 MED ORDER — HEMOSTATIC AGENTS (NO CHARGE) OPTIME
TOPICAL | Status: DC | PRN
  Administered 2020-02-21: 1 via TOPICAL

## 2020-02-21 MED ORDER — SODIUM CHLORIDE 0.9 % IV SOLN: 250.0000 mL | INTRAVENOUS | Status: DC

## 2020-02-21 MED ORDER — HEMOSTATIC AGENTS (NO CHARGE) OPTIME
TOPICAL | Status: DC | PRN
  Administered 2020-02-21: 3 via TOPICAL

## 2020-02-21 MED ORDER — ALBUMIN HUMAN 5 % IV SOLN: INTRAVENOUS | Status: DC | PRN

## 2020-02-21 MED ORDER — ACETAMINOPHEN 650 MG RE SUPP
650.0000 mg | Freq: Once | RECTAL | Status: AC
  Administered 2020-02-21: 19:00:00 650 mg via RECTAL

## 2020-02-21 MED ORDER — DOCUSATE SODIUM 100 MG PO CAPS
200.0000 mg | ORAL_CAPSULE | Freq: Every day | ORAL | Status: DC
  Filled 2020-02-21: qty 2

## 2020-02-21 MED ORDER — METOPROLOL TARTRATE 25 MG/10 ML ORAL SUSPENSION: 12.5000 mg | Freq: Two times a day (BID) | ORAL | Status: DC

## 2020-02-21 MED ORDER — PHENYLEPHRINE 40 MCG/ML (10ML) SYRINGE FOR IV PUSH (FOR BLOOD PRESSURE SUPPORT)
PREFILLED_SYRINGE | INTRAVENOUS | Status: AC
  Filled 2020-02-21: qty 10

## 2020-02-21 MED ORDER — MIDAZOLAM HCL 5 MG/5ML IJ SOLN
INTRAMUSCULAR | Status: DC | PRN
  Administered 2020-02-21 (×4): 2 mg via INTRAVENOUS
  Administered 2020-02-21 (×2): 1 mg via INTRAVENOUS

## 2020-02-21 MED ORDER — ALBUMIN HUMAN 5 % IV SOLN
250.0000 mL | INTRAVENOUS | Status: AC | PRN
  Administered 2020-02-21 (×2): 12.5 g via INTRAVENOUS

## 2020-02-21 MED ORDER — LACTATED RINGERS IV SOLN: INTRAVENOUS | Status: DC | PRN

## 2020-02-21 MED ORDER — ACETAMINOPHEN 500 MG PO TABS
1000.0000 mg | ORAL_TABLET | Freq: Four times a day (QID) | ORAL | Status: DC
  Administered 2020-02-21 – 2020-02-23 (×3): 1000 mg via ORAL
  Filled 2020-02-21 (×3): qty 2

## 2020-02-21 MED ORDER — 0.9 % SODIUM CHLORIDE (POUR BTL) OPTIME
TOPICAL | Status: DC | PRN
  Administered 2020-02-21: 4000 mL

## 2020-02-21 MED ORDER — MIDAZOLAM HCL 2 MG/2ML IJ SOLN: 2.0000 mg | INTRAMUSCULAR | Status: DC | PRN

## 2020-02-21 MED ORDER — METOPROLOL TARTRATE 5 MG/5ML IV SOLN: 2.5000 mg | INTRAVENOUS | Status: DC | PRN

## 2020-02-21 MED ORDER — ONDANSETRON HCL 4 MG/2ML IJ SOLN
4.0000 mg | Freq: Four times a day (QID) | INTRAMUSCULAR | Status: DC | PRN
  Administered 2020-02-21 – 2020-02-22 (×3): 4 mg via INTRAVENOUS
  Filled 2020-02-21 (×4): qty 2

## 2020-02-21 MED ORDER — ARTIFICIAL TEARS OPHTHALMIC OINT
TOPICAL_OINTMENT | OPHTHALMIC | Status: DC | PRN
  Administered 2020-02-21: 1 via OPHTHALMIC

## 2020-02-21 MED ORDER — LACTATED RINGERS IV SOLN: 500.0000 mL | Freq: Once | INTRAVENOUS | Status: DC | PRN

## 2020-02-21 MED ORDER — INSULIN REGULAR(HUMAN) IN NACL 100-0.9 UT/100ML-% IV SOLN: INTRAVENOUS | Status: DC

## 2020-02-21 MED ORDER — OXYCODONE HCL 5 MG PO TABS: 5.0000 mg | ORAL_TABLET | ORAL | Status: DC | PRN

## 2020-02-21 MED ORDER — SODIUM CHLORIDE 0.9 % IV SOLN
1.5000 g | Freq: Two times a day (BID) | INTRAVENOUS | Status: AC
  Administered 2020-02-21 – 2020-02-23 (×4): 1.5 g via INTRAVENOUS
  Filled 2020-02-21 (×4): qty 1.5

## 2020-02-21 MED ORDER — DOBUTAMINE IN D5W 4-5 MG/ML-% IV SOLN: 2.5000 ug/kg/min | INTRAVENOUS | Status: DC

## 2020-02-21 MED ORDER — CHLORHEXIDINE GLUCONATE 0.12 % MT SOLN
15.0000 mL | OROMUCOSAL | Status: AC
  Administered 2020-02-21: 15:00:00 15 mL via OROMUCOSAL

## 2020-02-21 MED ORDER — DEXMEDETOMIDINE HCL IN NACL 400 MCG/100ML IV SOLN: 0.0000 ug/kg/h | INTRAVENOUS | Status: DC

## 2020-02-21 MED ORDER — SODIUM CHLORIDE 0.9 % IV SOLN: INTRAVENOUS | Status: DC

## 2020-02-21 MED ORDER — MAGNESIUM SULFATE 4 GM/100ML IV SOLN
4.0000 g | Freq: Once | INTRAVENOUS | Status: AC
  Administered 2020-02-21: 15:00:00 4 g via INTRAVENOUS
  Filled 2020-02-21: qty 100

## 2020-02-21 MED ORDER — BISACODYL 5 MG PO TBEC
10.0000 mg | DELAYED_RELEASE_TABLET | Freq: Every day | ORAL | Status: DC
  Filled 2020-02-21: qty 2

## 2020-02-21 MED ORDER — ROCURONIUM BROMIDE 10 MG/ML (PF) SYRINGE
PREFILLED_SYRINGE | INTRAVENOUS | Status: AC
  Filled 2020-02-21: qty 10

## 2020-02-21 MED ORDER — FENTANYL CITRATE (PF) 250 MCG/5ML IJ SOLN
INTRAMUSCULAR | Status: AC
  Filled 2020-02-21: qty 25

## 2020-02-21 MED ORDER — NOREPINEPHRINE 4 MG/250ML-% IV SOLN
0.0000 ug/min | INTRAVENOUS | Status: DC
  Administered 2020-02-21: 17:00:00 0 ug/min via INTRAVENOUS
  Administered 2020-02-22: 17:00:00 1 ug/min via INTRAVENOUS
  Filled 2020-02-21: qty 250

## 2020-02-21 MED ORDER — SODIUM CHLORIDE 0.9% FLUSH
3.0000 mL | Freq: Two times a day (BID) | INTRAVENOUS | Status: DC
  Administered 2020-02-22 (×2): 3 mL via INTRAVENOUS

## 2020-02-21 MED ORDER — METOCLOPRAMIDE HCL 5 MG/ML IJ SOLN
5.0000 mg | Freq: Three times a day (TID) | INTRAMUSCULAR | Status: AC
  Administered 2020-02-21 – 2020-02-22 (×3): 5 mg via INTRAVENOUS
  Filled 2020-02-21 (×3): qty 2

## 2020-02-21 MED ORDER — NITROGLYCERIN 0.2 MG/ML ON CALL CATH LAB
INTRAVENOUS | Status: DC | PRN
  Administered 2020-02-21: 20 ug via INTRAVENOUS

## 2020-02-21 MED ORDER — VANCOMYCIN HCL IN DEXTROSE 1-5 GM/200ML-% IV SOLN
1000.0000 mg | Freq: Once | INTRAVENOUS | Status: AC
  Administered 2020-02-21: 1000 mg via INTRAVENOUS
  Filled 2020-02-21: qty 200

## 2020-02-21 MED ORDER — MIDAZOLAM HCL (PF) 10 MG/2ML IJ SOLN
INTRAMUSCULAR | Status: AC
  Filled 2020-02-21: qty 2

## 2020-02-21 MED ORDER — NITROGLYCERIN IN D5W 200-5 MCG/ML-% IV SOLN
INTRAVENOUS | Status: DC | PRN
  Administered 2020-02-21: 16.6 ug/min via INTRAVENOUS

## 2020-02-21 MED ORDER — ASPIRIN 81 MG PO CHEW: 324.0000 mg | CHEWABLE_TABLET | Freq: Every day | ORAL | Status: DC

## 2020-02-21 MED ORDER — LACTATED RINGERS IV SOLN: INTRAVENOUS | Status: DC

## 2020-02-21 MED ORDER — FENTANYL CITRATE (PF) 250 MCG/5ML IJ SOLN
INTRAMUSCULAR | Status: DC | PRN
  Administered 2020-02-21 (×2): 100 ug via INTRAVENOUS
  Administered 2020-02-21 (×3): 150 ug via INTRAVENOUS
  Administered 2020-02-21: 200 ug via INTRAVENOUS
  Administered 2020-02-21: 100 ug via INTRAVENOUS
  Administered 2020-02-21 (×2): 50 ug via INTRAVENOUS
  Administered 2020-02-21 (×3): 100 ug via INTRAVENOUS
  Administered 2020-02-21: 150 ug via INTRAVENOUS

## 2020-02-21 MED ORDER — BISACODYL 10 MG RE SUPP: 10.0000 mg | Freq: Every day | RECTAL | Status: DC

## 2020-02-21 MED ORDER — PROPOFOL 10 MG/ML IV BOLUS
INTRAVENOUS | Status: DC | PRN
  Administered 2020-02-21: 100 mg via INTRAVENOUS

## 2020-02-21 MED ORDER — SODIUM CHLORIDE 0.9% FLUSH: 3.0000 mL | INTRAVENOUS | Status: DC | PRN

## 2020-02-21 MED ORDER — TRAMADOL HCL 50 MG PO TABS: 50.0000 mg | ORAL_TABLET | ORAL | Status: DC | PRN

## 2020-02-21 MED ORDER — HEPARIN SODIUM (PORCINE) 1000 UNIT/ML IJ SOLN
INTRAMUSCULAR | Status: AC
  Filled 2020-02-21: qty 1

## 2020-02-21 MED ORDER — FENTANYL CITRATE (PF) 250 MCG/5ML IJ SOLN
INTRAMUSCULAR | Status: AC
  Filled 2020-02-21: qty 5

## 2020-02-21 MED ORDER — ARTIFICIAL TEARS OPHTHALMIC OINT
TOPICAL_OINTMENT | OPHTHALMIC | Status: AC
  Filled 2020-02-21: qty 3.5

## 2020-02-21 MED ORDER — PHENYLEPHRINE 40 MCG/ML (10ML) SYRINGE FOR IV PUSH (FOR BLOOD PRESSURE SUPPORT)
PREFILLED_SYRINGE | INTRAVENOUS | Status: DC | PRN
  Administered 2020-02-21: 120 ug via INTRAVENOUS
  Administered 2020-02-21 (×2): 40 ug via INTRAVENOUS
  Administered 2020-02-21: 120 ug via INTRAVENOUS
  Administered 2020-02-21: 40 ug via INTRAVENOUS

## 2020-02-21 MED ORDER — CHLORHEXIDINE GLUCONATE CLOTH 2 % EX PADS
6.0000 | MEDICATED_PAD | Freq: Every day | CUTANEOUS | Status: DC
  Administered 2020-02-22 – 2020-02-23 (×2): 6 via TOPICAL

## 2020-02-21 MED ORDER — PROTAMINE SULFATE 10 MG/ML IV SOLN
INTRAVENOUS | Status: AC
  Filled 2020-02-21: qty 25

## 2020-02-21 MED ORDER — METOPROLOL TARTRATE 12.5 MG HALF TABLET
12.5000 mg | ORAL_TABLET | Freq: Two times a day (BID) | ORAL | Status: DC
  Administered 2020-02-21 – 2020-02-23 (×3): 12.5 mg via ORAL
  Filled 2020-02-21 (×3): qty 1

## 2020-02-21 MED ORDER — DEXTROSE 50 % IV SOLN: 0.0000 mL | INTRAVENOUS | Status: DC | PRN

## 2020-02-21 MED ORDER — ROCURONIUM BROMIDE 10 MG/ML (PF) SYRINGE
PREFILLED_SYRINGE | INTRAVENOUS | Status: DC | PRN
  Administered 2020-02-21: 40 mg via INTRAVENOUS
  Administered 2020-02-21: 60 mg via INTRAVENOUS
  Administered 2020-02-21 (×2): 50 mg via INTRAVENOUS

## 2020-02-21 MED ORDER — PROTAMINE SULFATE 10 MG/ML IV SOLN
INTRAVENOUS | Status: DC | PRN
  Administered 2020-02-21: 200 mg via INTRAVENOUS

## 2020-02-21 MED ORDER — ASPIRIN EC 325 MG PO TBEC
325.0000 mg | DELAYED_RELEASE_TABLET | Freq: Every day | ORAL | Status: DC
  Administered 2020-02-23: 325 mg via ORAL
  Filled 2020-02-21: qty 1

## 2020-02-21 MED ORDER — FAMOTIDINE IN NACL 20-0.9 MG/50ML-% IV SOLN
20.0000 mg | Freq: Two times a day (BID) | INTRAVENOUS | Status: AC
  Administered 2020-02-21 (×2): 20 mg via INTRAVENOUS
  Filled 2020-02-21: qty 50

## 2020-02-21 MED ORDER — ACETAMINOPHEN 160 MG/5ML PO SOLN: 650.0000 mg | Freq: Once | ORAL | Status: AC

## 2020-02-21 MED ORDER — ACETAMINOPHEN 160 MG/5ML PO SOLN: 1000.0000 mg | Freq: Four times a day (QID) | ORAL | Status: DC

## 2020-02-21 MED ORDER — DEXTROSE 50 % IV SOLN
INTRAVENOUS | Status: AC
  Administered 2020-02-21: 15:00:00 25 mL
  Filled 2020-02-21: qty 50

## 2020-02-21 MED ORDER — MORPHINE SULFATE (PF) 2 MG/ML IV SOLN
1.0000 mg | INTRAVENOUS | Status: DC | PRN
  Administered 2020-02-21: 18:00:00 2 mg via INTRAVENOUS
  Filled 2020-02-21: qty 1

## 2020-02-21 SURGICAL SUPPLY — 87 items
ADAPTER MULTI PERFUSION 15 (ADAPTER) ×3 IMPLANT
ADH SKN CLS APL DERMABOND .7 (GAUZE/BANDAGES/DRESSINGS) ×2
BASKET HEART (ORDER IN 25'S) (MISCELLANEOUS) ×3
BASKET HEART (ORDER IN 25S) (MISCELLANEOUS) ×2 IMPLANT
BLADE CLIPPER SURG (BLADE) ×3 IMPLANT
BLADE STERNUM SYSTEM 6 (BLADE) ×3 IMPLANT
BLADE SURG 11 STRL SS (BLADE) ×1 IMPLANT
BNDG ELASTIC 4X5.8 VLCR STR LF (GAUZE/BANDAGES/DRESSINGS) ×3 IMPLANT
BNDG ELASTIC 6X5.8 VLCR STR LF (GAUZE/BANDAGES/DRESSINGS) ×3 IMPLANT
BNDG GAUZE ELAST 4 BULKY (GAUZE/BANDAGES/DRESSINGS) ×3 IMPLANT
CABLE SURGICAL S-101-97-12 (CABLE) ×3 IMPLANT
CANISTER SUCT 3000ML PPV (MISCELLANEOUS) ×3 IMPLANT
CANNULA AORTIC ROOT 9FR (CANNULA) ×3 IMPLANT
CANNULA EZ GLIDE 8.0 24FR (CANNULA) ×3 IMPLANT
CANNULA MC2 2 STG 29/37 NON-V (CANNULA) ×2 IMPLANT
CANNULA MC2 TWO STAGE (CANNULA) ×3
CANNULA VESSEL 3MM BLUNT TIP (CANNULA) ×3 IMPLANT
CATH ROBINSON RED A/P 18FR (CATHETERS) ×6 IMPLANT
CLIP RETRACTION 3.0MM CORONARY (MISCELLANEOUS) ×3 IMPLANT
CLIP VESOCCLUDE MED 24/CT (CLIP) IMPLANT
CLIP VESOCCLUDE SM WIDE 24/CT (CLIP) IMPLANT
CONN ST 1/2X1/2  BEN (MISCELLANEOUS) ×3
CONN ST 1/2X1/2 BEN (MISCELLANEOUS) ×2 IMPLANT
CONNECTOR BLAKE 2:1 CARIO BLK (MISCELLANEOUS) ×3 IMPLANT
DEFOGGER ANTIFOG KIT (MISCELLANEOUS) ×1 IMPLANT
DERMABOND ADVANCED (GAUZE/BANDAGES/DRESSINGS) ×1
DERMABOND ADVANCED .7 DNX12 (GAUZE/BANDAGES/DRESSINGS) IMPLANT
DRAIN CHANNEL 19F RND (DRAIN) ×9 IMPLANT
DRAIN CONNECTOR BLAKE 1:1 (MISCELLANEOUS) ×3 IMPLANT
DRAPE CARDIOVASCULAR INCISE (DRAPES) ×3
DRAPE INCISE IOBAN 66X45 STRL (DRAPES) IMPLANT
DRAPE SLUSH/WARMER DISC (DRAPES) ×3 IMPLANT
DRAPE SRG 135X102X78XABS (DRAPES) ×2 IMPLANT
DRSG AQUACEL AG ADV 3.5X14 (GAUZE/BANDAGES/DRESSINGS) ×1 IMPLANT
DRSG COVADERM 4X14 (GAUZE/BANDAGES/DRESSINGS) ×3 IMPLANT
ELECT BLADE 4.0 EZ CLEAN MEGAD (MISCELLANEOUS) ×3
ELECT REM PT RETURN 9FT ADLT (ELECTROSURGICAL) ×6
ELECTRODE BLDE 4.0 EZ CLN MEGD (MISCELLANEOUS) IMPLANT
ELECTRODE REM PT RTRN 9FT ADLT (ELECTROSURGICAL) ×4 IMPLANT
FELT TEFLON 1X6 (MISCELLANEOUS) ×6 IMPLANT
FIBERTAPE STERNAL CLSR 2 36IN (SUTURE) ×3 IMPLANT
GAUZE SPONGE 4X4 12PLY STRL (GAUZE/BANDAGES/DRESSINGS) ×6 IMPLANT
GAUZE SPONGE 4X4 12PLY STRL LF (GAUZE/BANDAGES/DRESSINGS) ×2 IMPLANT
GLOVE BIO SURGEON STRL SZ7 (GLOVE) ×6 IMPLANT
GLOVE BIOGEL M STRL SZ7.5 (GLOVE) ×6 IMPLANT
GOWN STRL REUS W/ TWL LRG LVL3 (GOWN DISPOSABLE) ×8 IMPLANT
GOWN STRL REUS W/ TWL XL LVL3 (GOWN DISPOSABLE) ×4 IMPLANT
GOWN STRL REUS W/TWL LRG LVL3 (GOWN DISPOSABLE) ×12
GOWN STRL REUS W/TWL XL LVL3 (GOWN DISPOSABLE) ×6
HEMOSTAT POWDER SURGIFOAM 1G (HEMOSTASIS) ×9 IMPLANT
KIT BASIN OR (CUSTOM PROCEDURE TRAY) ×3 IMPLANT
KIT SUCTION CATH 14FR (SUCTIONS) ×3 IMPLANT
KIT TURNOVER KIT B (KITS) ×3 IMPLANT
KIT VASOVIEW HEMOPRO 2 VH 4000 (KITS) ×3 IMPLANT
LEAD PACING MYOCARDI (MISCELLANEOUS) ×3 IMPLANT
MARKER GRAFT CORONARY BYPASS (MISCELLANEOUS) ×9 IMPLANT
NS IRRIG 1000ML POUR BTL (IV SOLUTION) ×15 IMPLANT
PACK E OPEN HEART (SUTURE) ×3 IMPLANT
PACK OPEN HEART (CUSTOM PROCEDURE TRAY) ×3 IMPLANT
PAD ARMBOARD 7.5X6 YLW CONV (MISCELLANEOUS) ×6 IMPLANT
PAD ELECT DEFIB RADIOL ZOLL (MISCELLANEOUS) ×3 IMPLANT
PENCIL BUTTON HOLSTER BLD 10FT (ELECTRODE) ×3 IMPLANT
POSITIONER HEAD DONUT 9IN (MISCELLANEOUS) ×3 IMPLANT
PUNCH AORTIC ROTATE 4.0MM (MISCELLANEOUS) ×3 IMPLANT
SET CARDIOPLEGIA MPS 5001102 (MISCELLANEOUS) ×1 IMPLANT
SUT BONE WAX W31G (SUTURE) ×3 IMPLANT
SUT ETHIBOND X763 2 0 SH 1 (SUTURE) ×6 IMPLANT
SUT MNCRL AB 3-0 PS2 18 (SUTURE) ×6 IMPLANT
SUT MNCRL AB 4-0 PS2 18 (SUTURE) ×1 IMPLANT
SUT PDS AB 1 CTX 36 (SUTURE) ×6 IMPLANT
SUT PROLENE 4 0 SH DA (SUTURE) ×3 IMPLANT
SUT PROLENE 5 0 C 1 36 (SUTURE) ×11 IMPLANT
SUT PROLENE 7 0 BV 1 (SUTURE) ×1 IMPLANT
SUT PROLENE 7 0 BV1 MDA (SUTURE) ×4 IMPLANT
SUT STEEL 6MS V (SUTURE) ×6 IMPLANT
SUT VIC AB 2-0 CT1 27 (SUTURE) ×3
SUT VIC AB 2-0 CT1 TAPERPNT 27 (SUTURE) IMPLANT
SYSTEM SAHARA CHEST DRAIN ATS (WOUND CARE) ×3 IMPLANT
TAPE CLOTH SURG 4X10 WHT LF (GAUZE/BANDAGES/DRESSINGS) ×1 IMPLANT
TOWEL GREEN STERILE (TOWEL DISPOSABLE) ×3 IMPLANT
TOWEL GREEN STERILE FF (TOWEL DISPOSABLE) ×3 IMPLANT
TRAY FOLEY SLVR 16FR TEMP STAT (SET/KITS/TRAYS/PACK) ×3 IMPLANT
TUBE SUCT INTRACARD DLP 20F (MISCELLANEOUS) ×3 IMPLANT
TUBE SUCTION CARDIAC 10FR (CANNULA) ×3 IMPLANT
TUBING LAP HI FLOW INSUFFLATIO (TUBING) ×3 IMPLANT
UNDERPAD 30X30 (UNDERPADS AND DIAPERS) ×3 IMPLANT
WATER STERILE IRR 1000ML POUR (IV SOLUTION) ×6 IMPLANT

## 2020-02-21 NOTE — Anesthesia Procedure Notes (Signed)
Procedure Name: Intubation Date/Time: 02/21/2020 8:47 AM Performed by: Candis Shine, CRNA Pre-anesthesia Checklist: Patient identified, Emergency Drugs available, Suction available and Patient being monitored Patient Re-evaluated:Patient Re-evaluated prior to induction Oxygen Delivery Method: Circle System Utilized Preoxygenation: Pre-oxygenation with 100% oxygen Induction Type: IV induction Ventilation: Mask ventilation without difficulty Laryngoscope Size: Mac and 3 Grade View: Grade I Tube type: Oral Tube size: 8.0 mm Number of attempts: 1 Airway Equipment and Method: Stylet Placement Confirmation: ETT inserted through vocal cords under direct vision,  positive ETCO2 and breath sounds checked- equal and bilateral Secured at: 21 cm Tube secured with: Tape Dental Injury: Teeth and Oropharynx as per pre-operative assessment

## 2020-02-21 NOTE — Telephone Encounter (Signed)
Recieved request from : patient mother  Scanned to releases Forwarded to [employer] for processing via mail.

## 2020-02-21 NOTE — Op Note (Signed)
[address]       [place] [address]             [phone]                                          02/21/2020 Patient:  Felicia [name] Pre-Op Dx:  Unstable angina   Three-vessel coronary disease   Type 1 diabetes Post-op Dx: Same Procedure: CABG X 5, LIMA LAD,RSVG, OM1, D1, PDA, PLV (Y graft) Endoscopic greater saphenous vein harvest on the right Intra-operative Transesophageal Echocardiogram  Surgeon and Role:      * Lajuana Matte, MD - Primary    *Dr. Servando Snare, MD- assisting Assistant: T. Harriet Pho, PA-C  Anesthesia  general EBL: 500 ml Blood Administration: 1 unit of packed red blood cells Xclamp Time: 69 min Pump Time: 129 min  Drains: 19 F blake drain:  L, mediastinal  Wires: None Counts: correct   Indications: Felicia [name] is a 59 year old female with history of type 1 diabetes who was transferred from [facility] with unstable angina, and a left heart cath which shows three-vessel coronary disease.  She does report some anginal symptoms that began around [place] of this year but prior to this she was essentially asymptomatic.  On review of her images she has tandem lesions in the proximal LAD, but the distal target is good.  She is also got diagonal disease and circumflex disease, as well as a tight stenosis in the RCA.  On echocardiogram she has preserved LV and RV function, and no significant valvular disease.  In regards to her diabetes, her hemoglobin A1c was 8.9.  The risk benefits and alternatives to proceed with surgical revascularization was discussed with her, and she is agreeable to proceed.  Findings: Small LIMA, good vein conduit, good targets.  Good flows on vein grafts.  Good function on post cardiopulmonary bypass TEE  Operative Technique: All invasive lines were placed in pre-op holding.  After the risks, benefits and alternatives were thoroughly discussed, the patient was brought to the operative  theatre.  Anesthesia was induced, and the patient was prepped and draped in normal sterile fashion.  An appropriate surgical pause was performed, and pre-operative antibiotics were dosed accordingly.  We began with simultaneous incisions were made along the right leg for harvesting of the greater saphenous vein and the chest for the sternotomy.  In regards to the sternotomy, this was carried down with bovie cautery, and the sternum was divided with a reciprocating saw.  Meticulous hemostasis was obtained.  The left internal thoracic artery was exposed and harvested in in pedicled fashion.  The patient was systemically heparinized, and the artery was divided distally, and placed in a papaverine sponge.    The sternal elevator was removed, and a retractor was placed.  The pericardium was divided in the midline and fashioned into a cradle with pericardial stitches.   After we confirmed an appropriate ACT, the ascending aorta was cannulated in standard fashion.  The right atrial appendage was used for venous cannulation site.  Cardiopulmonary bypass was initiated, and the heart retractor was placed. The cross clamp was applied, and a dose of anterograde cardioplegia was given with good arrest of the heart.  We moved to the posterior wall of the heart, and found a good target on the PDA, and PLV branches.  An arteriotomy was made, and the vein graft was anastomosed to each in an end to side fashion.  The PLV vein graft was then jumped off the hood of the PDA vein graft.  Next we exposed the lateral wall, and found a good target on the OM1.  An end to side anastomosis with the vein graft was then created.  Next, we exposed the anterior wall of the heart and identified a good target on D1.   An arteriotomy was created.  The vein was anastomosed in an end to side fashion.  Finally, we exposed a good target on the LAD, and fashioned an end to side anastomosis between it and the LITA.  We began to re-warm, and a  re-animation dose of cardioplegia was given.  The heart was de-aired, and the cross clamp was removed.  Meticulous hemostasis was obtained.    A partial occludding clamp was then placed on the ascending aorta, and we created an end to side anastomosis between it and the proximal vein grafts.  The proximal sites were marked with rings.  Hemostasis was obtained, and we separated from cardiopulmonary bypass without event.the heparin was reversed with protamine.  Chest tubes and wires were placed, and the sternum was re-approximated with with sternal wires.  The soft tissue and skin were re-approximated wth absorbable suture.    The patient tolerated the procedure without any immediate complications, and was transferred to the ICU in guarded condition.  Felicia [name]

## 2020-02-21 NOTE — Anesthesia Procedure Notes (Signed)
Arterial Line Insertion Start/End4/19/2021 7:10 AM Performed by: Jed Limerick, CRNA, CRNA  Patient location: Pre-op. Preanesthetic checklist: patient identified, IV checked, site marked, risks and benefits discussed, surgical consent, monitors and equipment checked, pre-op evaluation, timeout performed and anesthesia consent Lidocaine 1% used for infiltration Right, radial was placed Catheter size: 20 G Hand hygiene performed  and maximum sterile barriers used   Attempts: 1 Procedure performed without using ultrasound guided technique. Following insertion, dressing applied and Biopatch. Post procedure assessment: normal and unchanged  Patient tolerated the procedure well with no immediate complications.

## 2020-02-21 NOTE — Progress Notes (Signed)
Patient was extubated to 3 LPM nasal cannula.  Patient had a NIF of -40 and a VC of .90. Patient did not have a positive cuff leak. When cuff was deflated VT did not drop and there was no air leak even with cough. RN called MD [name] and got the okay to extubated.  Patient had a good cough and was able to speak name. BBS clear. No stridor noted. Vitals are stable. SPO2 1005%.  RN at bedside doing IS with patient.

## 2020-02-21 NOTE — Anesthesia Postprocedure Evaluation (Signed)
Anesthesia Post Note  Patient: VERTIE DIBBERN  Procedure(s) Performed: CORONARY ARTERY BYPASS GRAFTING (CABG) times five using left internal mammary and right leg saphenous vein (N/A Chest) TRANSESOPHAGEAL ECHOCARDIOGRAM (TEE) (N/A Esophagus)     Patient location during evaluation: ICU Anesthesia Type: General Level of consciousness: awake Pain management: pain level controlled Vital Signs Assessment: post-procedure vital signs reviewed and stable Respiratory status: spontaneous breathing, nonlabored ventilation, respiratory function stable and patient connected to nasal cannula oxygen Cardiovascular status: blood pressure returned to baseline and stable Postop Assessment: no apparent nausea or vomiting Anesthetic complications: no    Last Vitals:  Vitals:   02/21/20 1745 02/21/20 1800  BP: 109/65 (!) 102/59  Pulse: 72 74  Resp: (!) 24 (!) 25  Temp: 37.1 C 37.3 C  SpO2: 100% 96%    Last Pain:  Vitals:   02/21/20 0530  TempSrc: Oral  PainSc:                  Casey Maxfield P Sumner Boesch

## 2020-02-21 NOTE — Transfer of Care (Signed)
Immediate Anesthesia Transfer of Care Note  Patient: Felicia [name]  Procedure(s) Performed: CORONARY ARTERY BYPASS GRAFTING (CABG) times five using left internal mammary and right leg saphenous vein (N/A Chest) TRANSESOPHAGEAL ECHOCARDIOGRAM (TEE) (N/A Esophagus)  Patient Location: ICU  Anesthesia Type:General  Level of Consciousness: sedated and Patient remains intubated per anesthesia plan  Airway & Oxygen Therapy: Patient remains intubated per anesthesia plan and Patient placed on Ventilator (see vital sign flow sheet for setting)  Post-op Assessment: Report given to RN and Post -op Vital signs reviewed and stable  Post vital signs: Reviewed and stable  Last Vitals:  Vitals Value Taken Time  BP 98/66 02/21/20 1431  Temp    Pulse 67 02/21/20 1440  Resp 14 02/21/20 1440  SpO2 98 % 02/21/20 1440  Vitals shown include unvalidated device data.  Last Pain:  Vitals:   02/21/20 0530  TempSrc: Oral  PainSc:       Patients Stated Pain Goal: 0 (02/20/20 1300)  Complications: No apparent anesthesia complications

## 2020-02-21 NOTE — Anesthesia Procedure Notes (Signed)
Central Venous Catheter Insertion Performed by: Leonides Grills, MD, anesthesiologist Start/End4/19/2021 7:15 AM, 02/21/2020 7:30 AM Patient location: Pre-op. Preanesthetic checklist: patient identified, IV checked, site marked, risks and benefits discussed, surgical consent, monitors and equipment checked, pre-op evaluation, timeout performed and anesthesia consent Position: Trendelenburg Lidocaine 1% used for infiltration and patient sedated Hand hygiene performed , maximum sterile barriers used  and Seldinger technique used Catheter size: 8.5 Fr Total catheter length 10. Central line was placed.Sheath introducer Swan type:thermodilution Procedure performed using ultrasound guided technique. Ultrasound Notes:anatomy identified, needle tip was noted to be adjacent to the nerve/plexus identified, no ultrasound evidence of intravascular and/or intraneural injection and image(s) printed for medical record Attempts: 1 Following insertion, line sutured, dressing applied and Biopatch. Post procedure assessment: blood return through all ports, free fluid flow and no air  Patient tolerated the procedure well with no immediate complications.

## 2020-02-21 NOTE — Progress Notes (Signed)
     [address]       [place] [address]             [phone]       No events Pain improved  Vitals:   02/21/20 0739 02/21/20 0740  BP:    Pulse: 67 68  Resp: 11 13  Temp:    SpO2: 100% 100%   Alert NAD Sinus EWOB  59 yo female with unstable angina OR today for CABG 4  Amel Kitch O Joas Motton

## 2020-02-21 NOTE — Progress Notes (Signed)
Patient ID: Felicia [name], female   DOB: 12/04/1960, 59 y.o.   MRN: [id number] EVENING ROUNDS NOTE :     [address]       [place] [address]             [phone]                 Day of Surgery Procedure(s) (LRB): CORONARY ARTERY BYPASS GRAFTING (CABG) times five using left internal mammary and right leg saphenous vein (N/A) TRANSESOPHAGEAL ECHOCARDIOGRAM (TEE) (N/A)  Total Length of Stay:  LOS: 4 days  BP 109/65   Pulse 72   Temp 98.8 F (37.1 C)   Resp (!) 24   Wt 60.1 kg   SpO2 100%   BMI 22.74 kg/m   .Intake/Output      04/18 0701 - 04/19 0700 04/19 0701 - 04/20 0700   P.O. 240    I.V. (mL/kg) 20 (0.3) 2054.9 (34.2)   Blood  340   IV Piggyback  1669.2   Total Intake(mL/kg) 260 (4.3) 4064.1 (67.6)   Urine (mL/kg/hr)  3975 (6)   Blood  600   Chest Tube  90   Total Output  4665   Net +260 -600.9          . sodium chloride Stopped (02/21/20 1621)  . [START ON 02/22/2020] sodium chloride    . sodium chloride    . albumin human 12.5 g (02/21/20 1655)  . cefUROXime (ZINACEF)  IV    . dexmedetomidine (PRECEDEX) IV infusion 0.3 mcg/kg/hr (02/21/20 1700)  . DOBUTamine    . famotidine (PEPCID) IV 20 mg (02/21/20 1458)  . insulin 0.9 mL/hr at 02/21/20 1700  . lactated ringers    . lactated ringers    . lactated ringers 20 mL/hr at 02/21/20 1700  . magnesium sulfate 20 mL/hr at 02/21/20 1700  . niCARDipine Stopped (02/21/20 1627)  . norepinephrine (LEVOPHED) Adult infusion 0 mcg/min (02/21/20 1652)  . potassium chloride 10 mEq (02/21/20 1726)  . vancomycin       Lab Results  Component Value Date   WBC 6.2 02/21/2020   HGB 10.2 (L) 02/21/2020   HCT 30.0 (L) 02/21/2020   PLT 99 (L) 02/21/2020   GLUCOSE 109 (H) 02/21/2020   CHOL 196 09/23/2019   TRIG 160.0 (H) 09/23/2019   HDL 58.10 09/23/2019   LDLDIRECT 120.0 06/17/2019   LDLCALC 106 (H) 09/23/2019   ALT 14 02/20/2020   AST 19 02/20/2020   NA 144 02/21/2020   K 3.9 02/21/2020   CL  102 02/21/2020   CREATININE 0.60 02/21/2020   BUN 16 02/21/2020   CO2 26 02/21/2020   TSH 2.744 02/20/2020   INR 1.5 (H) 02/21/2020   HGBA1C 8.9 (H) 02/17/2020   MICROALBUR <0.7 09/17/2017   Not bleeding Extubated  Good uop    Delight Ovens MD  Beeper [phone] Office [phone] 02/21/2020 5:59 PM

## 2020-02-21 NOTE — Plan of Care (Signed)
  Problem: Education: Goal: Knowledge of General Education information will improve Description: Including pain rating scale, medication(s)/side effects and non-pharmacologic comfort measures 02/21/2020 2351 by Melinda Crutch, RN Outcome: Progressing 02/21/2020 2351 by Melinda Crutch, RN Outcome: Progressing   Problem: Health Behavior/Discharge Planning: Goal: Ability to manage health-related needs will improve 02/21/2020 2351 by Melinda Crutch, RN Outcome: Progressing 02/21/2020 2351 by Melinda Crutch, RN Outcome: Progressing   Problem: Clinical Measurements: Goal: Ability to maintain clinical measurements within normal limits will improve 02/21/2020 2351 by Melinda Crutch, RN Outcome: Progressing 02/21/2020 2351 by Melinda Crutch, RN Outcome: Progressing Goal: Will remain free from infection 02/21/2020 2351 by Melinda Crutch, RN Outcome: Progressing 02/21/2020 2351 by Melinda Crutch, RN Outcome: Progressing Goal: Diagnostic test results will improve 02/21/2020 2351 by Melinda Crutch, RN Outcome: Progressing 02/21/2020 2351 by Melinda Crutch, RN Outcome: Progressing Goal: Respiratory complications will improve 02/21/2020 2351 by Melinda Crutch, RN Outcome: Progressing 02/21/2020 2351 by Melinda Crutch, RN Outcome: Progressing Goal: Cardiovascular complication will be avoided 02/21/2020 2351 by Melinda Crutch, RN Outcome: Progressing 02/21/2020 2351 by Melinda Crutch, RN Outcome: Progressing   Problem: Activity: Goal: Risk for activity intolerance will decrease 02/21/2020 2351 by Melinda Crutch, RN Outcome: Progressing 02/21/2020 2351 by Melinda Crutch, RN Outcome: Progressing   Problem: Nutrition: Goal: Adequate nutrition will be maintained 02/21/2020 2351 by Melinda Crutch, RN Outcome: Progressing 02/21/2020 2351 by Melinda Crutch, RN Outcome: Progressing   Problem: Coping: Goal: Level of anxiety will decrease 02/21/2020  2351 by Melinda Crutch, RN Outcome: Progressing 02/21/2020 2351 by Melinda Crutch, RN Outcome: Progressing   Problem: Elimination: Goal: Will not experience complications related to bowel motility 02/21/2020 2351 by Melinda Crutch, RN Outcome: Progressing 02/21/2020 2351 by Melinda Crutch, RN Outcome: Progressing Goal: Will not experience complications related to urinary retention 02/21/2020 2351 by Melinda Crutch, RN Outcome: Progressing 02/21/2020 2351 by Melinda Crutch, RN Outcome: Progressing   Problem: Pain Managment: Goal: General experience of comfort will improve 02/21/2020 2351 by Melinda Crutch, RN Outcome: Progressing 02/21/2020 2351 by Melinda Crutch, RN Outcome: Progressing   Problem: Safety: Goal: Ability to remain free from injury will improve 02/21/2020 2351 by Melinda Crutch, RN Outcome: Progressing 02/21/2020 2351 by Melinda Crutch, RN Outcome: Progressing   Problem: Skin Integrity: Goal: Risk for impaired skin integrity will decrease 02/21/2020 2351 by Melinda Crutch, RN Outcome: Progressing 02/21/2020 2351 by Melinda Crutch, RN Outcome: Progressing   Problem: Education: Goal: Will demonstrate proper wound care and an understanding of methods to prevent future damage Outcome: Progressing Goal: Knowledge of disease or condition will improve Outcome: Progressing Goal: Knowledge of the prescribed therapeutic regimen will improve Outcome: Progressing Goal: Individualized Educational Video(s) Outcome: Progressing   Problem: Activity: Goal: Risk for activity intolerance will decrease Outcome: Progressing   Problem: Cardiac: Goal: Will achieve and/or maintain hemodynamic stability Outcome: Progressing   Problem: Clinical Measurements: Goal: Postoperative complications will be avoided or minimized Outcome: Progressing   Problem: Respiratory: Goal: Respiratory status will improve Outcome: Progressing   Problem: Skin  Integrity: Goal: Wound healing without signs and symptoms of infection Outcome: Progressing Goal: Risk for impaired skin integrity will decrease Outcome: Progressing   Problem: Urinary Elimination: Goal: Ability to achieve and maintain adequate renal perfusion and functioning will improve Outcome: Progressing

## 2020-02-21 NOTE — Progress Notes (Signed)
  Echocardiogram Echocardiogram Transesophageal has been performed.  Cira Deyoe A Mignonne Afonso 02/21/2020, 9:25 AM

## 2020-02-22 ENCOUNTER — Encounter: Payer: Self-pay | Admitting: *Deleted

## 2020-02-22 ENCOUNTER — Inpatient Hospital Stay (HOSPITAL_COMMUNITY): Payer: BC Managed Care – PPO

## 2020-02-22 DIAGNOSIS — Z794 Long term (current) use of insulin: Secondary | ICD-10-CM | POA: Diagnosis not present

## 2020-02-22 DIAGNOSIS — E1043 Type 1 diabetes mellitus with diabetic autonomic (poly)neuropathy: Secondary | ICD-10-CM | POA: Diagnosis not present

## 2020-02-22 DIAGNOSIS — J90 Pleural effusion, not elsewhere classified: Secondary | ICD-10-CM | POA: Diagnosis not present

## 2020-02-22 DIAGNOSIS — Z951 Presence of aortocoronary bypass graft: Secondary | ICD-10-CM | POA: Diagnosis not present

## 2020-02-22 DIAGNOSIS — D62 Acute posthemorrhagic anemia: Secondary | ICD-10-CM | POA: Diagnosis not present

## 2020-02-22 DIAGNOSIS — I25110 Atherosclerotic heart disease of native coronary artery with unstable angina pectoris: Secondary | ICD-10-CM | POA: Diagnosis not present

## 2020-02-22 LAB — BASIC METABOLIC PANEL
Anion gap: 11 (ref 5–15)
Anion gap: 11 (ref 5–15)
BUN: 14 mg/dL (ref 6–20)
BUN: 17 mg/dL (ref 6–20)
CO2: 21 mmol/L — ABNORMAL LOW (ref 22–32)
CO2: 19 mmol/L — ABNORMAL LOW (ref 22–32)
Calcium: 8.3 mg/dL — ABNORMAL LOW (ref 8.9–10.3)
Calcium: 8 mg/dL — ABNORMAL LOW (ref 8.9–10.3)
Chloride: 108 mmol/L (ref 98–111)
Chloride: 110 mmol/L (ref 98–111)
Creatinine, Ser: 0.93 mg/dL (ref 0.44–1.00)
Creatinine, Ser: 0.98 mg/dL (ref 0.44–1.00)
GFR calc Af Amer: >60 mL/min (ref >60)
GFR calc Af Amer: >60 mL/min (ref >60)
GFR calc non Af Amer: >60 mL/min (ref >60)
GFR calc non Af Amer: >60 mL/min (ref >60)
Glucose, Bld: 119 mg/dL — ABNORMAL HIGH (ref 70–99)
Glucose, Bld: 139 mg/dL — ABNORMAL HIGH (ref 70–99)
Potassium: 4.5 mmol/L (ref 3.5–5.1)
Potassium: 4.6 mmol/L (ref 3.5–5.1)
Sodium: 140 mmol/L (ref 135–145)
Sodium: 140 mmol/L (ref 135–145)

## 2020-02-22 LAB — CBC
HCT: 35.7 % — ABNORMAL LOW (ref 36.0–46.0)
HCT: 35.8 % — ABNORMAL LOW (ref 36.0–46.0)
Hemoglobin: 11.7 g/dL — ABNORMAL LOW (ref 12.0–15.0)
Hemoglobin: 11.4 g/dL — ABNORMAL LOW (ref 12.0–15.0)
MCH: 29.4 pg (ref 26.0–34.0)
MCH: 29 pg (ref 26.0–34.0)
MCHC: 32.8 g/dL (ref 30.0–36.0)
MCHC: 31.8 g/dL (ref 30.0–36.0)
MCV: 89.7 fL (ref 80.0–100.0)
MCV: 91.1 fL (ref 80.0–100.0)
Platelets: 167 10*3/uL (ref 150–400)
Platelets: UNDETERMINED 10*3/uL (ref 150–400)
RBC: 3.98 MIL/uL (ref 3.87–5.11)
RBC: 3.93 MIL/uL (ref 3.87–5.11)
RDW: 14.2 % (ref 11.5–15.5)
RDW: 14.3 % (ref 11.5–15.5)
WBC: 11.8 10*3/uL — ABNORMAL HIGH (ref 4.0–10.5)
WBC: 8.8 10*3/uL (ref 4.0–10.5)
nRBC: 0 % (ref 0.0–0.2)
nRBC: 0 % (ref 0.0–0.2)

## 2020-02-22 LAB — MAGNESIUM
Magnesium: 2.7 mg/dL — ABNORMAL HIGH (ref 1.7–2.4)
Magnesium: 2.3 mg/dL (ref 1.7–2.4)

## 2020-02-22 LAB — GLUCOSE, CAPILLARY
Glucose-Capillary: 85 mg/dL (ref 70–99)
Glucose-Capillary: 88 mg/dL (ref 70–99)
Glucose-Capillary: 121 mg/dL — ABNORMAL HIGH (ref 70–99)
Glucose-Capillary: 78 mg/dL (ref 70–99)
Glucose-Capillary: 93 mg/dL (ref 70–99)
Glucose-Capillary: 115 mg/dL — ABNORMAL HIGH (ref 70–99)
Glucose-Capillary: 114 mg/dL — ABNORMAL HIGH (ref 70–99)
Glucose-Capillary: 91 mg/dL (ref 70–99)
Glucose-Capillary: 108 mg/dL — ABNORMAL HIGH (ref 70–99)
Glucose-Capillary: 114 mg/dL — ABNORMAL HIGH (ref 70–99)
Glucose-Capillary: 106 mg/dL — ABNORMAL HIGH (ref 70–99)
Glucose-Capillary: 111 mg/dL — ABNORMAL HIGH (ref 70–99)
Glucose-Capillary: 104 mg/dL — ABNORMAL HIGH (ref 70–99)
Glucose-Capillary: 143 mg/dL — ABNORMAL HIGH (ref 70–99)
Glucose-Capillary: 128 mg/dL — ABNORMAL HIGH (ref 70–99)
Glucose-Capillary: 115 mg/dL — ABNORMAL HIGH (ref 70–99)

## 2020-02-22 MED ORDER — INSULIN ASPART 100 UNIT/ML ~~LOC~~ SOLN
0.0000 [IU] | SUBCUTANEOUS | Status: DC
  Administered 2020-02-22 – 2020-02-23 (×4): 2 [IU] via SUBCUTANEOUS

## 2020-02-22 MED ORDER — SODIUM CHLORIDE 0.9% FLUSH: 10.0000 mL | INTRAVENOUS | Status: DC | PRN

## 2020-02-22 MED ORDER — METOCLOPRAMIDE HCL 5 MG/ML IJ SOLN
5.0000 mg | Freq: Four times a day (QID) | INTRAMUSCULAR | Status: DC
  Administered 2020-02-22 (×2): 5 mg via INTRAVENOUS
  Filled 2020-02-22 (×2): qty 2

## 2020-02-22 MED ORDER — SODIUM CHLORIDE 0.9% FLUSH
10.0000 mL | Freq: Two times a day (BID) | INTRAVENOUS | Status: DC
  Administered 2020-02-22: 23:00:00 10 mL

## 2020-02-22 MED ORDER — INSULIN GLARGINE 100 UNIT/ML ~~LOC~~ SOLN
4.0000 [IU] | Freq: Two times a day (BID) | SUBCUTANEOUS | Status: DC
  Administered 2020-02-22 – 2020-02-23 (×3): 4 [IU] via SUBCUTANEOUS
  Filled 2020-02-22 (×5): qty 0.04

## 2020-02-22 MED ORDER — PROMETHAZINE HCL 25 MG/ML IJ SOLN
12.5000 mg | Freq: Four times a day (QID) | INTRAMUSCULAR | Status: DC | PRN
  Administered 2020-02-22: 09:00:00 12.5 mg via INTRAVENOUS
  Filled 2020-02-22: qty 1

## 2020-02-22 NOTE — Progress Notes (Signed)
   Sitting, doing well.  Daughter also at bedside.  Normal sinus rhythm.  Postop EKG is nonischemic.  Doing well.

## 2020-02-22 NOTE — Discharge Summary (Signed)
Physician Discharge Summary  Felicia [name] ID: Felicia [name] MRN: [id number] DOB/AGE: Nov 07, 1960 59 y.o.  Admit date: 02/17/2020 Discharge date: 02/24/2020  Admission Diagnoses: Unstable angina  Discharge Diagnoses:  Active Problems:   Unstable angina (HCC)   S/P CABG (coronary artery bypass graft)  Felicia [name] Active Problem List   Diagnosis Date Noted  . S/P CABG (coronary artery bypass graft) 02/21/2020  . Unstable angina ([facility]) 02/17/2020  . Chest tightness 02/07/2020  . Chest pain of uncertain etiology 79/89/2119  . External otitis of right ear 08/31/2019  . Reflux esophagitis 06/28/2019  . Left foot pain 08/07/2018  . Allergic sinusitis 01/27/2018  . Thoracic back pain 07/02/2017  . Dysphagia 12/01/2015  . Skin rash 07/19/2014  . Systolic murmur 41/74/0814  . Diarrhea 10/19/2012  . Leg pain, bilateral 07/03/2011  . Type 1 diabetes mellitus with mild nonproliferative retinopathy of both eyes ([place]) 06/25/2010  . Hyperlipidemia due to type 1 diabetes mellitus ([place]) 06/25/2010  . Essential hypertension 06/25/2010  . Diabetic gastroparesis associated with type 1 diabetes mellitus ([place]) 06/25/2010  . NEPHROLITHIASIS, HX OF 06/25/2010   History of Present Illness:    at time of consultation This is a 59 year old female with a past medical history of hypertension, hyperlipidemia, diabetes mellitus (type I), diabetic gastroparesis, IBS, GERD, and family history of heart disease in first degree relatives who states she started exercising and lifting weights in order to purposefully lose weight. She would have intermittent episodes, both at rest and with exertion, of a tightness on the left side of her right breast that does not radiate and worsens with stress. This has been occurring for over 2 weeks. She denies nausea, shortness of breath, diaphoresis, palpitations, or syncope. Initially, she thought the chest tightness was related to lifting weights. After Easter, she was still having episodes  of chest tightness so she saw her medical doctor, Dr. Danise Mina. EKG show NSR and Q waves unchanged from EKG in 2016. She was hypertensive, given Nitro with improvement, and Dr. Danise Mina was concerned about possible unstable angina. It was arranged for Felicia [name] to go to [facility] ED on 02/07/2020 for further evaluation. EKG  Showed SR, septal infarct (age undetermined) and Troponin I (high sensitivity) was 3. She was discharged home and a follow up appointment was made quickly with Dr. Rockey Situ. Felicia [name] reports that Dr. Rockey Situ said on CT scan that was done in 2016 there were multiple areas of calcium and a cardiac catheterization was recommended. She had a cardiac catheterization done via femoral artery at [facility]. Results showed LVEF 65%, proximal to mid LAD 90% stenosed, Diagonal 1 95% stenosed, Diagonal 2 70% stenosed, ostial to proximal and mid RCA 70% stenosed. A cardiothoracic consultation has been requested with Dr. Kipp Brood. At the time of my examination, Felicia [name] was in sinus rhythm and vital signs were stable. She denied chest tightness.  Dr. Kipp Brood evaluated the Felicia [name] and her studies and agree with recommendations to proceed with CABG for best revascularization option due to the severity of the disease and anatomical findings.    Discharged Condition: good  Hospital Course: Felicia [name] was medically stabilized and on 02/21/2020 taken to the operating room where she underwent the below described procedure.  She tolerated it well and was taken to the surgical intensive care unit in stable condition.  Postoperative hospital course:  Felicia [name] was extubated without difficulty using standard protocols.  She has remained neurologically intact.  She has had some early postoperative nausea which is improving over time.  She does have a history of gastroparesis.  Initial hemodynamics have been good but she has required low-dose Levophed to assist with blood pressure which was somewhat labile at  times.  It was able to be weaned over time without significant difficulty.  She has had some systolic hypertension and has been started on low-dose ACE inhibitor.  Her chest tubes were removed on postoperative day #2.  Blood sugars have been under good control.  She will be transitioned to her home dosing at discharge as long as her appetite is adequate.  Her control at home is somewhat poor and she will need more vigilance and management over time.  We have asked the diabetic coordinator to see the Felicia [name] for educational purposes.  Her most recent hemoglobin A1c on 02/17/2020 is 8.9.  Her hematocrit is quite stable with most recent value 37.2 on 02/23/2020.  Renal function has remained in normal limits.  She does have some postoperative volume overload and is given a short course of diuretics.  Incisions are noted to be healing well without evidence of infection.  Oxygen has been weaned and maintains good saturations on room air.  She is tolerating gradually increasing activities using standard cardiac rehab modalities.  At the time of discharge the Felicia [name] is felt to be quite stable.  Consults: cardiology  Significant Diagnostic Studies: Routine postoperative serial labs and chest x-rays. ECHOCARDIOGRAM REPORT       Felicia [name] Name:  Felicia [name] Date of Exam: 02/17/2020  Medical Rec #: [id number]   Height:    64.0 in  Accession #:  [id number]   Weight:    136.0 lb  Date of Birth: Jun 18, 1961   BSA:     1.661 m  Felicia [name] Age:  59 years    BP:      185/81 mmHg  Felicia [name] Gender: F       HR:      82 bpm.  Exam Location: Inpatient   Procedure: 2D Echo   Indications:  coronary artery disease.    History:    Felicia [name] has prior history of Echocardiogram examinations,  most         recent 01/19/2013. Signs/Symptoms:Chest Pain; Risk         Factors:Hypertension, Diabetes and Dyslipidemia.    Sonographer:  Delcie Roch   Referring Phys: [id number] HARRELL O LIGHTFOOT   IMPRESSIONS    1. Left ventricular ejection fraction, by estimation, is 65 to 70%. The  left ventricle has normal function. The left ventricle has no regional  wall motion abnormalities. Left ventricular diastolic parameters are  consistent with Grade I diastolic  dysfunction (impaired relaxation).  2. Right ventricular systolic function is normal. The right ventricular  size is normal.  3. The mitral valve is normal in structure. Mild mitral valve  regurgitation. No evidence of mitral stenosis.  4. The aortic valve is normal in structure. Aortic valve regurgitation is  not visualized. Mild to moderate aortic valve sclerosis/calcification is  present, without any evidence of aortic stenosis.  5. The inferior vena cava is normal in size with greater than 50%  respiratory variability, suggesting right atrial pressure of 3 mmHg.   FINDINGS  Left Ventricle: Left ventricular ejection fraction, by estimation, is 65  to 70%. The left ventricle has normal function. The left ventricle has no  regional wall motion abnormalities. The left ventricular internal cavity  size was normal in size. There is  no left ventricular hypertrophy. Left ventricular diastolic parameters  are consistent with Grade  I diastolic dysfunction (impaired relaxation).   Right Ventricle: The right ventricular size is normal. No increase in  right ventricular wall thickness. Right ventricular systolic function is  normal.   Left Atrium: Left atrial size was normal in size.   Right Atrium: Right atrial size was normal in size.   Pericardium: There is no evidence of pericardial effusion.   Mitral Valve: The mitral valve is normal in structure. Normal mobility of  the mitral valve leaflets. Mild mitral valve regurgitation. No evidence of  mitral valve stenosis.   Tricuspid Valve: The tricuspid valve is normal in structure. Tricuspid  valve regurgitation is  trivial. No evidence of tricuspid stenosis.   Aortic Valve: The aortic valve is normal in structure. Aortic valve  regurgitation is not visualized. Mild to moderate aortic valve  sclerosis/calcification is present, without any evidence of aortic  stenosis.   Pulmonic Valve: The pulmonic valve was normal in structure. Pulmonic valve  regurgitation is not visualized. No evidence of pulmonic stenosis.   Aorta: The aortic root is normal in size and structure.   Venous: The inferior vena cava is normal in size with greater than 50%  respiratory variability, suggesting right atrial pressure of 3 mmHg.   IAS/Shunts: No atrial level shunt detected by color flow Doppler.     LEFT VENTRICLE  PLAX 2D  LVIDd:     3.70 cm Diastology  LVIDs:     2.40 cm LV e' lateral:  11.30 cm/s  LV PW:     0.70 cm LV E/e' lateral: 11.0  LV IVS:    0.80 cm LV e' medial:  8.38 cm/s  LVOT diam:   1.90 cm LV E/e' medial: 14.8  LV SV:     62  LV SV Index:  37  LVOT Area:   2.84 cm     RIGHT VENTRICLE       IVC  RV S prime:   16.40 cm/s IVC diam: 1.20 cm  TAPSE (M-mode): 2.6 cm   LEFT ATRIUM       Index  LA diam:    3.00 cm 1.81 cm/m  LA Vol (A2C):  29.6 ml 17.82 ml/m  LA Vol (A4C):  28.4 ml 17.10 ml/m  LA Biplane Vol: 29.2 ml 17.58 ml/m  AORTIC VALVE  LVOT Vmax:  114.00 cm/s  LVOT Vmean: 72.100 cm/s  LVOT VTI:  0.219 m    AORTA  Ao Root diam: 2.60 cm   MITRAL VALVE  MV Area (PHT): 3.63 cm   SHUNTS  MV Decel Time: 209 msec   Systemic VTI: 0.22 m  MV E velocity: 124.00 cm/s Systemic Diam: 1.90 cm  MV A velocity: 105.00 cm/s  MV E/A ratio: 1.18   Tobias Alexander MD  Electronically signed by Tobias Alexander MD  Signature Date/Time: 02/17/2020/6:14:03 PM      LEFT HEART CATH AND CORONARY ANGIOGRAPHY  Conclusion   1st Mrg lesion is 60% stenosed.  Prox LAD to Mid LAD lesion is 90% stenosed.  Mid LAD-1 lesion is  80% stenosed.  Mid LAD-2 lesion is 50% stenosed.  1st Diag-1 lesion is 95% stenosed.  1st Diag-2 lesion is 70% stenosed.  Ost RCA to Prox RCA lesion is 70% stenosed.  Mid RCA lesion is 70% stenosed.  RPDA lesion is 50% stenosed.  The left ventricular ejection fraction is greater than 65% by visual estimate.  LV end diastolic pressure is normal.  The left ventricular systolic function is normal.  There is no mitral valve regurgitation.  Treatments: surgery:   02/21/2020 Felicia [name]:  Felicia [name] Pre-Op Dx:     Unstable angina                         Three-vessel coronary disease                         Type 1 diabetes Post-op Dx: Same Procedure: CABG X 5, LIMA LAD,RSVG, OM1, D1, PDA, PLV (Y graft) Endoscopic greater saphenous vein harvest on the right Intra-operative Transesophageal Echocardiogram  Surgeon and Role:      * Corliss Skains, MD - Primary    *Dr. Tyrone Sage, MD- assisting Assistant: T. Asa Lente, PA-C  Anesthesia  general    Discharge Exam: Blood pressure 122/62, pulse 78, temperature 98.4 F (36.9 C), temperature source Oral, resp. rate 18, height 5\' 4"  (1.626 m), weight 62.7 kg, SpO2 93 %.  General appearance: alert, cooperative and no distress Neurologic: intact Heart: regular rate and rhythm Lungs: Breath sounds clear. O2 sats are adeequate on RA. Extremities: no peripheral edema Wound: the sternal incision is intact and dry.   Disposition:    Allergies as of 02/24/2020      Reactions   Protonix [pantoprazole] Swelling   Facial Swelling   Toujeo Solostar [insulin Glargine] Shortness Of Breath   Lactose Intolerance (gi)    GI   Magnesium-containing Compounds Swelling, Other (See Comments)   limbs   Adhesive [tape] Rash, Other (See Comments)   Band aids      Medication List    STOP taking these medications   NON FORMULARY   quinapril 5 MG tablet Commonly known as: ACCUPRIL     TAKE these medications   acetaminophen  325 MG tablet Commonly known as: TYLENOL Take 2 tablets (650 mg total) by mouth every 6 (six) hours as needed for mild pain.   aspirin 325 MG EC tablet Take 1 tablet (325 mg total) by mouth daily. Start taking on: February 25, 2020 What changed:   medication strength  how much to take   esomeprazole 40 MG capsule Commonly known as: NEXIUM Take 1 capsule (40 mg total) by mouth daily. Felicia [name] needs office visit for further refills   ezetimibe 10 MG tablet Commonly known as: ZETIA Take 1 tablet (10 mg total) by mouth daily. Start taking on: February 25, 2020   furosemide 40 MG tablet Commonly known as: LASIX Take 1 tablet (40 mg total) by mouth daily for 2 days. Start taking on: February 25, 2020   glucagon 1 MG injection Inject 1 mg into the muscle once as needed (in case of severe hypoglycemia).   insulin regular 100 units/mL injection Commonly known as: NovoLIN R INJECT 6 UNITS TOTAL INTO THE SKIN 3 (THREE) TIMES DAILY BEFORE MEALS. What changed:   how much to take  how to take this  when to take this  additional instructions   Insulin Syringe-Needle U-100 30G X 5/16" 0.3 ML Misc Use to inject insulin 6 times a day.   Lantus SoloStar 100 UNIT/ML Solostar Pen Generic drug: insulin glargine Inject 12 Units into the skin daily. What changed:   how much to take  when to take this  additional instructions   lisinopril 5 MG tablet Commonly known as: ZESTRIL Take 1 tablet (5 mg total) by mouth daily. Start taking on: February 25, 2020   metoprolol tartrate 25 MG tablet Commonly known as: LOPRESSOR Take 0.5 tablets (12.5 mg  total) by mouth 2 (two) times daily.   multivitamin tablet Take 1 tablet by mouth daily.   NovoFine 32G X 6 MM Misc Generic drug: Insulin Pen Needle USE TO INJECT INSULIN 4 TIMES A DAY   potassium chloride SA 20 MEQ tablet Commonly known as: KLOR-CON Take 1 tablet (20 mEq total) by mouth daily for 2 days. Start taking on: February 25, 2020    pravastatin 40 MG tablet Commonly known as: PRAVACHOL TAKE 1.5 TABLETS (60 MG TOTAL) BY MOUTH DAILY. What changed: See the new instructions.   traMADol 50 MG tablet Commonly known as: ULTRAM Take 1 tablet (50 mg total) by mouth every 4 (four) hours as needed for up to 7 days for moderate pain.      Follow-up Information    Corliss SkainsLightfoot, Harrell O, MD. Go on 03/03/2020.   Specialty: Cardiothoracic Surgery Why: You have a follow-up appointment with Dr. Cliffton AstersLightfoot on Friday, 03/03/20 at 10:30am.  [facility] 1/2-hour prior to this appointment.  It is located in the same office complex on the first floor. Contact information: [address] [place] [address] [phone]        Alver SorrowWalker, Caitlin S, NP. Go on 03/08/2020.   Specialty: Cardiology Why: You have a cardilogy follow up appointment on Wednesday, 03/08/20 at 9:30am.  Contact information: [address] [place] [address] [phone]          The Felicia [name] has been discharged on:   1.Beta Blocker:  Yes [place].Etienne[y   ]                              No   [   ]                              If No, reason:  2.Ace Inhibitor/ARB: Yes [  y ]                                     No  [    ]                                     If No, reason:  3.Statin:   Yes [ y  ]                  No  [   ]                  If No, reason:  4.Ecasa:  Yes  [ y  ]                  No   [   ]                  If No, reason:  Signed: Leary RocaMyron G. Cordaryl Decelles, PA-C 02/24/2020, 11:53 AM

## 2020-02-22 NOTE — Progress Notes (Addendum)
EVENING ROUNDS NOTE :     [address]       [place] [address]             [phone]                 1 Day Post-Op Procedure(s) (LRB): CORONARY ARTERY BYPASS GRAFTING (CABG) times five using left internal mammary and right leg saphenous vein (N/A) TRANSESOPHAGEAL ECHOCARDIOGRAM (TEE) (N/A)  Total Length of Stay:  LOS: 5 days  BP (!) 127/56   Pulse 77   Temp 98.6 F (37 C) (Oral)   Resp 16   Wt 64.4 kg   SpO2 95%   BMI 24.37 kg/m   .Intake/Output      04/20 0701 - 04/21 0700   I.V. (mL/kg) 120.6 (1.9)   Blood    IV Piggyback 100   Total Intake(mL/kg) 220.6 (3.4)   Urine (mL/kg/hr) 140 (0.2)   Emesis/NG output    Stool 0   Blood    Chest Tube 150   Total Output 290   Net -69.4       Stool Occurrence 2 x     . sodium chloride 10 mL/hr at 02/22/20 1600  . sodium chloride    . sodium chloride    . cefUROXime (ZINACEF)  IV Stopped (02/22/20 1137)  . dexmedetomidine (PRECEDEX) IV infusion 0.666 mcg/kg/hr (02/22/20 0700)  . DOBUTamine    . lactated ringers    . lactated ringers    . lactated ringers Stopped (02/22/20 0440)  . niCARDipine Stopped (02/21/20 1627)  . norepinephrine (LEVOPHED) Adult infusion 1 mcg/min (02/22/20 1630)     Lab Results  Component Value Date   WBC 8.8 02/22/2020   HGB 11.4 (L) 02/22/2020   HCT 35.8 (L) 02/22/2020   PLT PLATELET CLUMPS NOTED ON SMEAR, UNABLE TO ESTIMATE 02/22/2020   GLUCOSE 139 (H) 02/22/2020   CHOL 196 09/23/2019   TRIG 160.0 (H) 09/23/2019   HDL 58.10 09/23/2019   LDLDIRECT 120.0 06/17/2019   LDLCALC 106 (H) 09/23/2019   ALT 14 02/20/2020   AST 19 02/20/2020   NA 140 02/22/2020   K 4.6 02/22/2020   CL 110 02/22/2020   CREATININE 0.98 02/22/2020   BUN 17 02/22/2020   CO2 19 (L) 02/22/2020   TSH 2.744 02/20/2020   INR 1.5 (H) 02/21/2020   HGBA1C 8.9 (H) 02/17/2020   MICROALBUR <0.7 09/17/2017     On small dose of levo Conts to progress well   Rowe Clack, PA-C   I have seen  and examined the patient and agree with the assessment and plan as outlined.  Purcell Nails, MD 02/22/2020 10:26 PM

## 2020-02-22 NOTE — Progress Notes (Addendum)
TCTS DAILY ICU PROGRESS NOTE                   [address]            [place] [address]          [phone]   1 Day Post-Op Procedure(s) (LRB): CORONARY ARTERY BYPASS GRAFTING (CABG) times five using left internal mammary and right leg saphenous vein (N/A) TRANSESOPHAGEAL ECHOCARDIOGRAM (TEE) (N/A)  Total Length of Stay:  LOS: 5 days   Subjective: C/O significant nausea- + h/o gastroparesis  Objective: Vital signs in last 24 hours: Temp:  [97 F (36.1 C)-100.8 F (38.2 C)] 99.5 F (37.5 C) (04/20 0700) Pulse Rate:  [59-75] 64 (04/20 0700) Cardiac Rhythm: Normal sinus rhythm (04/20 0400) Resp:  [12-25] 14 (04/20 0700) BP: (82-145)/(47-69) 145/69 (04/20 0700) SpO2:  [93 %-100 %] 100 % (04/20 0700) Arterial Line BP: (93-167)/(37-66) 167/66 (04/20 0700) FiO2 (%):  [40 %-50 %] 40 % (04/19 1703) Weight:  [64.4 kg] 64.4 kg (04/20 0500)  Filed Weights   02/20/20 0626 02/21/20 0530 02/22/20 0500  Weight: 60.7 kg 60.1 kg 64.4 kg    Weight change: 4.298 kg   Hemodynamic parameters for last 24 hours: CVP:  [1 mmHg-12 mmHg] 6 mmHg  Intake/Output from previous day: 04/19 0701 - 04/20 0700 In: 5090 [I.V.:2634.2; Blood:340; IV Piggyback:2115.9] Out: 5705 [Urine:4710; Emesis/NG output:25; Blood:600; Chest Tube:370]  Intake/Output this shift: No intake/output data recorded.  Current Meds: Scheduled Meds: . acetaminophen  1,000 mg Oral Q6H   Or  . acetaminophen (TYLENOL) oral liquid 160 mg/5 mL  1,000 mg Per Tube Q6H  . aspirin EC  325 mg Oral Daily   Or  . aspirin  324 mg Per Tube Daily  . bisacodyl  10 mg Oral Daily   Or  . bisacodyl  10 mg Rectal Daily  . Chlorhexidine Gluconate Cloth  6 each Topical Daily  . docusate sodium  200 mg Oral Daily  . ezetimibe  10 mg Oral Daily  . [START ON 02/23/2020] famotidine  20 mg Oral BID  . metoprolol tartrate  12.5 mg Oral BID   Or  . metoprolol tartrate  12.5 mg Per Tube BID  . sodium chloride flush  3  mL Intravenous Q12H   Continuous Infusions: . sodium chloride Stopped (02/21/20 2142)  . sodium chloride    . sodium chloride    . albumin human 12.5 g (02/21/20 1655)  . cefUROXime (ZINACEF)  IV Stopped (02/21/20 2141)  . dexmedetomidine (PRECEDEX) IV infusion 0.666 mcg/kg/hr (02/22/20 0700)  . DOBUTamine    . insulin Stopped (02/22/20 4268)  . lactated ringers    . lactated ringers    . lactated ringers Stopped (02/22/20 0440)  . niCARDipine Stopped (02/21/20 1627)  . norepinephrine (LEVOPHED) Adult infusion 5 mcg/min (02/22/20 0700)   PRN Meds:.sodium chloride, albumin human, dextrose, lactated ringers, metoprolol tartrate, midazolam, morphine injection, ondansetron (ZOFRAN) IV, oxyCODONE, sodium chloride flush, sodium chloride flush, traMADol  General appearance: alert, cooperative and no distress Heart: regular rate and rhythm and no rub Lungs: mildly dim in abses Abdomen: benign, non distended, + BS Extremities: no edema Wound: dressings CDI  Lab Results: CBC: Recent Labs    02/21/20 2114 02/22/20 0319  WBC 11.7* 11.8*  HGB 11.4* 11.7*  HCT 34.5* 35.7*  PLT 155 167   BMET:  Recent Labs    02/21/20 2114 02/22/20 0319  NA 140 140  K 4.3 4.5  CL 109 108  CO2 23 21*  GLUCOSE 112* 119*  BUN 13 14  CREATININE 0.93 0.93  CALCIUM 8.3* 8.3*    CMET: Lab Results  Component Value Date   WBC 11.8 (H) 02/22/2020   HGB 11.7 (L) 02/22/2020   HCT 35.7 (L) 02/22/2020   PLT 167 02/22/2020   GLUCOSE 119 (H) 02/22/2020   CHOL 196 09/23/2019   TRIG 160.0 (H) 09/23/2019   HDL 58.10 09/23/2019   LDLDIRECT 120.0 06/17/2019   LDLCALC 106 (H) 09/23/2019   ALT 14 02/20/2020   AST 19 02/20/2020   NA 140 02/22/2020   K 4.5 02/22/2020   CL 108 02/22/2020   CREATININE 0.93 02/22/2020   BUN 14 02/22/2020   CO2 21 (L) 02/22/2020   TSH 2.744 02/20/2020   INR 1.5 (H) 02/21/2020   HGBA1C 8.9 (H) 02/17/2020   MICROALBUR <0.7 09/17/2017      PT/INR:  Recent Labs     02/21/20 1424  LABPROT 17.5*  INR 1.5*   Radiology: [facility] Chest Port 1 View  Result Date: 02/21/2020 CLINICAL DATA:  Status post CABG. EXAM: PORTABLE CHEST 1 VIEW COMPARISON:  One-view chest x-ray 02/21/2020 at 6:26 a.m. FINDINGS: Patient is now status post CABG for median sternotomy. Graft markers are noted. Endotracheal tube terminates 3 cm above the carina. Side port of the NG tube is in the stomach. Mediastinal drains and left pleural drain is present. No pneumothorax is present. Right IJ catheter terminates at the cavoatrial junction. Lung volumes are low with moderate pulmonary vascular congestion. No significant effusions are present. IMPRESSION: 1. Status post CABG for median sternotomy without radiographic evidence for complication. 2. Moderate pulmonary vascular congestion. 3. The support apparatus is satisfactory. Electronically Signed   By: San Morelle M.D.   On: 02/21/2020 15:16     Assessment/Plan: S/P Procedure(s) (LRB): CORONARY ARTERY BYPASS GRAFTING (CABG) times five using left internal mammary and right leg saphenous vein (N/A) TRANSESOPHAGEAL ECHOCARDIOGRAM (TEE) (N/A)  1 overall doing well POD#1 2 stable hemodynamics with some BP lability, on low dose levo, wean as able 3 extubated without difficulty sats good on 4 liters [place] 4 CXR with some atx, min effus- routine pulm toilet, CT - 370 cc yesterday, prob convert to JP drains ths am- no air leak noted 5 BS adeq control, transition to home Insulin dosing over time. Poor control at home with HgA1C 8.9- will ask diabetic coordinator to see 6 EKG appears stable with some low voltage 7 minor expected ABL anemia- monitor clinically 8 normal renal function, excellent spont diuresis- may need lasix, monitor for now 9 tmax 100.8, very minor leukocytosis- monitor clinically , prob systemic inflam response 10 routine rehab modalities 11 see POD # 1orders John Giovanni OA-C pager [phone] 02/22/2020 7:58 AM   Agree with  above Doing well Will wean levo Will remove lines and foley Ambulation today CT bulbed.  Meagan Ancona Bary Leriche

## 2020-02-23 ENCOUNTER — Inpatient Hospital Stay (HOSPITAL_COMMUNITY): Payer: BC Managed Care – PPO

## 2020-02-23 DIAGNOSIS — J9811 Atelectasis: Secondary | ICD-10-CM | POA: Diagnosis not present

## 2020-02-23 LAB — CBC
HCT: 37.2 % (ref 36.0–46.0)
Hemoglobin: 11.7 g/dL — ABNORMAL LOW (ref 12.0–15.0)
MCH: 29.3 pg (ref 26.0–34.0)
MCHC: 31.5 g/dL (ref 30.0–36.0)
MCV: 93.2 fL (ref 80.0–100.0)
Platelets: 149 10*3/uL — ABNORMAL LOW (ref 150–400)
RBC: 3.99 MIL/uL (ref 3.87–5.11)
RDW: 14.3 % (ref 11.5–15.5)
WBC: 9.8 10*3/uL (ref 4.0–10.5)
nRBC: 0 % (ref 0.0–0.2)

## 2020-02-23 LAB — GLUCOSE, CAPILLARY
Glucose-Capillary: 104 mg/dL — ABNORMAL HIGH (ref 70–99)
Glucose-Capillary: 114 mg/dL — ABNORMAL HIGH (ref 70–99)
Glucose-Capillary: 127 mg/dL — ABNORMAL HIGH (ref 70–99)
Glucose-Capillary: 139 mg/dL — ABNORMAL HIGH (ref 70–99)
Glucose-Capillary: 143 mg/dL — ABNORMAL HIGH (ref 70–99)
Glucose-Capillary: 146 mg/dL — ABNORMAL HIGH (ref 70–99)

## 2020-02-23 LAB — BASIC METABOLIC PANEL
Anion gap: 17 — ABNORMAL HIGH (ref 5–15)
BUN: 18 mg/dL (ref 6–20)
CO2: 17 mmol/L — ABNORMAL LOW (ref 22–32)
Calcium: 8.6 mg/dL — ABNORMAL LOW (ref 8.9–10.3)
Chloride: 105 mmol/L (ref 98–111)
Creatinine, Ser: 1.05 mg/dL — ABNORMAL HIGH (ref 0.44–1.00)
GFR calc Af Amer: >60 mL/min (ref >60)
GFR calc non Af Amer: 59 mL/min — ABNORMAL LOW (ref >60)
Glucose, Bld: 119 mg/dL — ABNORMAL HIGH (ref 70–99)
Potassium: 4.5 mmol/L (ref 3.5–5.1)
Sodium: 139 mmol/L (ref 135–145)

## 2020-02-23 MED ORDER — ACETAMINOPHEN 325 MG PO TABS: 650.0000 mg | ORAL_TABLET | Freq: Four times a day (QID) | ORAL | Status: DC | PRN

## 2020-02-23 MED ORDER — BISACODYL 5 MG PO TBEC: 10.0000 mg | DELAYED_RELEASE_TABLET | Freq: Every day | ORAL | Status: DC | PRN

## 2020-02-23 MED ORDER — INSULIN ASPART 100 UNIT/ML ~~LOC~~ SOLN: 0.0000 [IU] | Freq: Three times a day (TID) | SUBCUTANEOUS | Status: DC

## 2020-02-23 MED ORDER — POTASSIUM CHLORIDE CRYS ER 20 MEQ PO TBCR
20.0000 meq | EXTENDED_RELEASE_TABLET | Freq: Every day | ORAL | Status: DC
  Administered 2020-02-23 – 2020-02-24 (×2): 20 meq via ORAL
  Filled 2020-02-23 (×2): qty 1

## 2020-02-23 MED ORDER — SODIUM CHLORIDE 0.9% FLUSH: 3.0000 mL | INTRAVENOUS | Status: DC | PRN

## 2020-02-23 MED ORDER — SODIUM CHLORIDE 0.9% FLUSH: 3.0000 mL | Freq: Two times a day (BID) | INTRAVENOUS | Status: DC

## 2020-02-23 MED ORDER — ONDANSETRON HCL 4 MG PO TABS: 4.0000 mg | ORAL_TABLET | Freq: Four times a day (QID) | ORAL | Status: DC | PRN

## 2020-02-23 MED ORDER — ~~LOC~~ CARDIAC SURGERY, PATIENT & FAMILY EDUCATION: Freq: Once | Status: DC

## 2020-02-23 MED ORDER — ASPIRIN EC 325 MG PO TBEC
325.0000 mg | DELAYED_RELEASE_TABLET | Freq: Every day | ORAL | Status: DC
  Administered 2020-02-24: 10:00:00 325 mg via ORAL
  Filled 2020-02-23: qty 1

## 2020-02-23 MED ORDER — INSULIN GLARGINE 100 UNIT/ML ~~LOC~~ SOLN
8.0000 [IU] | Freq: Every day | SUBCUTANEOUS | Status: DC
  Administered 2020-02-23: 8 [IU] via SUBCUTANEOUS
  Filled 2020-02-23 (×2): qty 0.08

## 2020-02-23 MED ORDER — ALUM & MAG HYDROXIDE-SIMETH 200-200-20 MG/5ML PO SUSP: 15.0000 mL | ORAL | Status: DC | PRN

## 2020-02-23 MED ORDER — SODIUM CHLORIDE 0.9 % IV SOLN: 250.0000 mL | INTRAVENOUS | Status: DC | PRN

## 2020-02-23 MED ORDER — MAGNESIUM HYDROXIDE 400 MG/5ML PO SUSP: 30.0000 mL | Freq: Every day | ORAL | Status: DC | PRN

## 2020-02-23 MED ORDER — LISINOPRIL 5 MG PO TABS
5.0000 mg | ORAL_TABLET | Freq: Every day | ORAL | Status: DC
  Administered 2020-02-23 – 2020-02-24 (×2): 5 mg via ORAL
  Filled 2020-02-23 (×2): qty 1

## 2020-02-23 MED ORDER — INSULIN ASPART 100 UNIT/ML ~~LOC~~ SOLN: 0.0000 [IU] | Freq: Every day | SUBCUTANEOUS | Status: DC

## 2020-02-23 MED ORDER — ONDANSETRON HCL 4 MG/2ML IJ SOLN: 4.0000 mg | Freq: Four times a day (QID) | INTRAMUSCULAR | Status: DC | PRN

## 2020-02-23 MED ORDER — INSULIN ASPART 100 UNIT/ML ~~LOC~~ SOLN
3.0000 [IU] | Freq: Three times a day (TID) | SUBCUTANEOUS | Status: DC
  Administered 2020-02-23 – 2020-02-24 (×2): 3 [IU] via SUBCUTANEOUS

## 2020-02-23 MED ORDER — BISACODYL 10 MG RE SUPP: 10.0000 mg | Freq: Every day | RECTAL | Status: DC | PRN

## 2020-02-23 MED ORDER — PRAVASTATIN SODIUM 40 MG PO TABS
40.0000 mg | ORAL_TABLET | Freq: Every day | ORAL | Status: DC
  Administered 2020-02-23: 40 mg via ORAL
  Filled 2020-02-23: qty 1

## 2020-02-23 MED ORDER — FUROSEMIDE 40 MG PO TABS
40.0000 mg | ORAL_TABLET | Freq: Every day | ORAL | Status: DC
  Administered 2020-02-23 – 2020-02-24 (×2): 40 mg via ORAL
  Filled 2020-02-23 (×2): qty 1

## 2020-02-23 MED ORDER — INSULIN ASPART 100 UNIT/ML ~~LOC~~ SOLN
0.0000 [IU] | Freq: Three times a day (TID) | SUBCUTANEOUS | Status: DC
  Administered 2020-02-24: 06:00:00 1 [IU] via SUBCUTANEOUS

## 2020-02-23 MED ORDER — METOCLOPRAMIDE HCL 5 MG/ML IJ SOLN: 5.0000 mg | Freq: Four times a day (QID) | INTRAMUSCULAR | Status: DC | PRN

## 2020-02-23 MED ORDER — TRAMADOL HCL 50 MG PO TABS: 50.0000 mg | ORAL_TABLET | ORAL | Status: DC | PRN

## 2020-02-23 MED ORDER — METOPROLOL TARTRATE 12.5 MG HALF TABLET
12.5000 mg | ORAL_TABLET | Freq: Two times a day (BID) | ORAL | Status: DC
  Administered 2020-02-23 – 2020-02-24 (×2): 12.5 mg via ORAL
  Filled 2020-02-23 (×2): qty 1

## 2020-02-23 MED ORDER — DOCUSATE SODIUM 100 MG PO CAPS
200.0000 mg | ORAL_CAPSULE | Freq: Every day | ORAL | Status: DC
  Filled 2020-02-23: qty 2

## 2020-02-23 MED FILL — Lidocaine HCl Local Preservative Free (PF) Inj 2%: INTRAMUSCULAR | Qty: 15 | Status: AC

## 2020-02-23 MED FILL — Heparin Sodium (Porcine) Inj 1000 Unit/ML: INTRAMUSCULAR | Qty: 30 | Status: AC

## 2020-02-23 MED FILL — Potassium Chloride Inj 2 mEq/ML: INTRAVENOUS | Qty: 40 | Status: AC

## 2020-02-23 NOTE — Progress Notes (Signed)
Spoke with RN Lorin Picket, he states the central line has been removed at this time

## 2020-02-23 NOTE — Progress Notes (Signed)
[address]       [place] [address]             [phone]      2 Days Post-Op Procedure(s) (LRB): CORONARY ARTERY BYPASS GRAFTING (CABG) times five using left internal mammary and right leg saphenous vein (N/A) TRANSESOPHAGEAL ECHOCARDIOGRAM (TEE) (N/A) Subjective: Feels much better, no nausea, ambulating well  Objective: Vital signs in last 24 hours: Temp:  [98.6 F (37 C)-100.2 F (37.9 C)] 99.2 F (37.3 C) (04/21 0728) Pulse Rate:  [65-78] 76 (04/21 0600) Cardiac Rhythm: Normal sinus rhythm (04/21 0400) Resp:  [10-25] 22 (04/21 0600) BP: (105-138)/(46-77) 128/69 (04/21 0600) SpO2:  [87 %-100 %] 95 % (04/21 0600) Arterial Line BP: (118-164)/(35-60) 136/35 (04/20 1603) Weight:  [64 kg] 64 kg (04/21 0500)  Hemodynamic parameters for last 24 hours:    Intake/Output from previous day: 04/20 0701 - 04/21 0700 In: 748.9 [P.O.:140; I.V.:308.9; IV Piggyback:200] Out: 465 [Urine:315; Chest Tube:150] Intake/Output this shift: No intake/output data recorded.  General appearance: alert, cooperative and no distress Heart: regular rate and rhythm Lungs: mildly dim in bases Abdomen: benign Extremities: + LE edema Wound: evh incis healing well, chest dressing CDI  Lab Results: Recent Labs    02/22/20 1551 02/23/20 0623  WBC 8.8 9.8  HGB 11.4* 11.7*  HCT 35.8* 37.2  PLT PLATELET CLUMPS NOTED ON SMEAR, UNABLE TO ESTIMATE 149*   BMET:  Recent Labs    02/22/20 1551 02/23/20 0623  NA 140 139  K 4.6 4.5  CL 110 105  CO2 19* 17*  GLUCOSE 139* 119*  BUN 17 18  CREATININE 0.98 1.05*  CALCIUM 8.0* 8.6*    PT/INR:  Recent Labs    02/21/20 1424  LABPROT 17.5*  INR 1.5*   ABG    Component Value Date/Time   PHART 7.314 (L) 02/21/2020 2053   HCO3 23.3 02/21/2020 2053   TCO2 25 02/21/2020 2053   ACIDBASEDEF 3.0 (H) 02/21/2020 2053   O2SAT 100.0 02/21/2020 2053   CBG (last 3)  Recent Labs    02/23/20 0004 02/23/20 0324  02/23/20 0725  GLUCAP 114* 104* 127*    Meds Scheduled Meds: . acetaminophen  1,000 mg Oral Q6H   Or  . acetaminophen (TYLENOL) oral liquid 160 mg/5 mL  1,000 mg Per Tube Q6H  . aspirin EC  325 mg Oral Daily   Or  . aspirin  324 mg Per Tube Daily  . bisacodyl  10 mg Oral Daily   Or  . bisacodyl  10 mg Rectal Daily  . Chlorhexidine Gluconate Cloth  6 each Topical Daily  . docusate sodium  200 mg Oral Daily  . ezetimibe  10 mg Oral Daily  . famotidine  20 mg Oral BID  . insulin aspart  0-24 Units Subcutaneous Q4H  . insulin glargine  4 Units Subcutaneous BID  . metoCLOPramide (REGLAN) injection  5 mg Intravenous Q6H  . metoprolol tartrate  12.5 mg Oral BID   Or  . metoprolol tartrate  12.5 mg Per Tube BID  . sodium chloride flush  10-40 mL Intracatheter Q12H  . sodium chloride flush  3 mL Intravenous Q12H   Continuous Infusions: . sodium chloride 10 mL/hr at 02/23/20 0600  . sodium chloride    . sodium chloride    . cefUROXime (ZINACEF)  IV 1.5 g (02/23/20 0800)  . dexmedetomidine (PRECEDEX) IV infusion 0.666 mcg/kg/hr (02/22/20 0700)  . DOBUTamine    . lactated  ringers    . lactated ringers    . lactated ringers Stopped (02/22/20 0440)  . niCARDipine Stopped (02/21/20 1627)  . norepinephrine (LEVOPHED) Adult infusion 1 mcg/min (02/23/20 0600)   PRN Meds:.sodium chloride, dextrose, lactated ringers, metoprolol tartrate, midazolam, morphine injection, ondansetron (ZOFRAN) IV, oxyCODONE, promethazine, sodium chloride flush, sodium chloride flush, sodium chloride flush, traMADol  Xrays DG Chest Port 1 View  Result Date: 02/22/2020 CLINICAL DATA:  Status post CABG. Chest tubes in place. EXAM: PORTABLE CHEST 1 VIEW COMPARISON:  02/21/2020 FINDINGS: The endotracheal tube and NG tube have been removed. Chest tubes and central venous catheter remain in place, unchanged. No pneumothorax. Slight haziness at both lung bases probably represents tiny effusions. CABG. The cardiac  silhouette and pulmonary vascularity are normal. No bone abnormality. IMPRESSION: Slight bibasilar  effusions. No pneumothorax. Electronically Signed   By: Lorriane Shire M.D.   On: 02/22/2020 09:25   DG Chest Port 1 View  Result Date: 02/21/2020 CLINICAL DATA:  Status post CABG. EXAM: PORTABLE CHEST 1 VIEW COMPARISON:  One-view chest x-ray 02/21/2020 at 6:26 a.m. FINDINGS: Patient is now status post CABG for median sternotomy. Graft markers are noted. Endotracheal tube terminates 3 cm above the carina. Side port of the NG tube is in the stomach. Mediastinal drains and left pleural drain is present. No pneumothorax is present. Right IJ catheter terminates at the cavoatrial junction. Lung volumes are low with moderate pulmonary vascular congestion. No significant effusions are present. IMPRESSION: 1. Status post CABG for median sternotomy without radiographic evidence for complication. 2. Moderate pulmonary vascular congestion. 3. The support apparatus is satisfactory. Electronically Signed   By: San Morelle M.D.   On: 02/21/2020 15:16    Assessment/Plan: S/P Procedure(s) (LRB): CORONARY ARTERY BYPASS GRAFTING (CABG) times five using left internal mammary and right leg saphenous vein (N/A) TRANSESOPHAGEAL ECHOCARDIOGRAM (TEE) (N/A)  1 hemodyn stable in sinus now off levo 2 sats mostly good on RA 3 no nausea , will make reglan prn 4 will d/c central line, has midline 5 very minor anemia 6 normal renal fxn, some volume overload- will add short course of lasix 7 tx to 4 east  LOS: 6 days    John Giovanni PA-C Pager [phone] 02/23/2020

## 2020-02-23 NOTE — Progress Notes (Addendum)
Inpatient Diabetes Program Recommendations  AACE/ADA: New Consensus Statement on Inpatient Glycemic Control (2015)  Target Ranges:  Prepandial:   less than 140 mg/dL      Peak postprandial:   less than 180 mg/dL (1-2 hours)      Critically ill patients:  140 - 180 mg/dL   Lab Results  Component Value Date   GLUCAP 146 (H) 02/23/2020   HGBA1C 8.9 (H) 02/17/2020    Review of Glycemic Control Results for Felicia [name], Felicia [name] (MRN [id number]) as of 02/23/2020 16:52  Ref. Range 02/22/2020 21:26 02/23/2020 00:04 02/23/2020 03:24 02/23/2020 07:25 02/23/2020 11:41  Glucose-Capillary Latest Ref Range: 70 - 99 mg/dL 051 (H) 833 (H) 582 (H) 127 (H) 146 (H)  Type 1 DM for 46 years Home dosages: (per patient) Lantus 8 units at HS, Lantus 10 units daily  Custom correction scale: CBGs: 150-210 = 1 units              211-270 = 2 units              271-330 = 3 units              331-390 = 4 units              391-450 - 5 units  Meal coverage: 1 unit/10 grams CHO (breakfast and lunch) 1 unit/20 grams CHO (dinner and snack) Current orders for Inpatient glycemic control:  Novolog TCTS q 4 hours  Inpatient Diabetes Program Recommendations:    Please restart basal insulin.  Consider adding Lantus 8 units q HS.  Also please consider reducing Novolog correction to sensitive q 4 hours (instead of TCTS correction scale).  May also consider adding Novolog meal coverage 3 units tid with meals (hold if patient eats less than 50%). Diabetes Coordinator spoke to patient on 02/18/20 regarding glycemic control.  Will follow.   Thanks,  Beryl Meager, RN, BC-ADM Inpatient Diabetes Coordinator Pager [phone] (8a-5p)  1700 addendum:  Spoke with RN regarding recommendations.  She states that patient is very specific about what she wants in terms of insulin doses.  Encouraged RN to listen to patient and call MD if patient is concerned about insulin doses or if she thinks she needs different dose for verbal orders. RN  states that patient is not eating well. Spoke with [name] and orders received.

## 2020-02-23 NOTE — Progress Notes (Signed)
Came to ambulate however pt sts she has walked x2 today. Encouraged her to walk again later, do IS, sit in recliner. Will f/u tomorrow. Ethelda Chick CES, ACSM 3:18 PM 02/23/2020

## 2020-02-23 NOTE — Progress Notes (Signed)
      [address]       [name] [address]             [phone]                 2 Days Post-Op Procedure(s) (LRB): CORONARY ARTERY BYPASS GRAFTING (CABG) times five using left internal mammary and right leg saphenous vein (N/A) TRANSESOPHAGEAL ECHOCARDIOGRAM (TEE) (N/A)   Events: No events.  Feels much better.  Nausea improved _______________________________________________________________ Vitals: BP (!) 140/54 (BP Location: Left Arm)   Pulse 75   Temp 98.7 F (37.1 C) (Oral)   Resp (!) 22   Wt 64 kg   SpO2 97%   BMI 24.24 kg/m   - Neuro: alert NAD  - Cardiovascular: sinus  Drips: none.      - Pulm: EWOB    ABG    Component Value Date/Time   PHART 7.314 (L) 02/21/2020 2053   PCO2ART 46.7 02/21/2020 2053   PO2ART 189.0 (H) 02/21/2020 2053   HCO3 23.3 02/21/2020 2053   TCO2 25 02/21/2020 2053   ACIDBASEDEF 3.0 (H) 02/21/2020 2053   O2SAT 100.0 02/21/2020 2053    - Abd: soft - Extremity: trace edema  .Intake/Output      04/20 0701 - 04/21 0700 04/21 0701 - 04/22 0700   P.O. 140    I.V. (mL/kg) 308.9 (4.8) 40.2 (0.6)   Blood     Other 100    IV Piggyback 200 100   Total Intake(mL/kg) 748.9 (11.7) 140.2 (2.2)   Urine (mL/kg/hr) 315 (0.2) 300 (0.9)   Emesis/NG output     Stool 0    Blood     Chest Tube 150    Total Output 465 300   Net +283.9 -159.9        Urine Occurrence  1 x   Stool Occurrence 2 x       _______________________________________________________________ Labs: CBC Latest Ref Rng & Units 02/23/2020 02/22/2020 02/22/2020  WBC 4.0 - 10.5 K/uL 9.8 8.8 11.8(H)  Hemoglobin 12.0 - 15.0 g/dL 11.7(L) 11.4(L) 11.7(L)  Hematocrit 36.0 - 46.0 % 37.2 35.8(L) 35.7(L)  Platelets 150 - 400 K/uL 149(L) PLATELET CLUMPS NOTED ON SMEAR, UNABLE TO ESTIMATE 167   CMP Latest Ref Rng & Units 02/23/2020 02/22/2020 02/22/2020  Glucose 70 - 99 mg/dL 025(K) 270(W) 237(S)  BUN 6 - 20 mg/dL 18 17 14   Creatinine 0.44 - 1.00 mg/dL )  2.83(T 5.17  Sodium 135 - 145 mmol/L 139 140 140  Potassium 3.5 - 5.1 mmol/L 4.5 4.6 4.5  Chloride 98 - 111 mmol/L 105 110 108  CO2 22 - 32 mmol/L 17(L) 19(L) 21(L)  Calcium 8.9 - 10.3 mg/dL 6.16) 0.7(P) 8.3(L)  Total Protein 6.5 - 8.1 g/dL - - -  Total Bilirubin 0.3 - 1.2 mg/dL - - -  Alkaline Phos 38 - 126 U/L - - -  AST 15 - 41 U/L - - -  ALT 0 - 44 U/L - - -    CXR: PV congestion  _______________________________________________________________  Assessment and Plan: POD 2 s/p CABG  Neuro: pain controlled CV: on A/S/BB.  Will remove CT today Pulm: continue pulm toilet Renal: will diurese some today GI: nausea improve, will switch reglan to prn Heme: stable ID: [email or web address] Endo: on insulin regimen.  DM consulted Dispo: floor today  7.1(G, MD 02/23/2020 12:00 PM

## 2020-02-24 ENCOUNTER — Other Ambulatory Visit: Payer: Self-pay | Admitting: Family Medicine

## 2020-02-24 LAB — BPAM RBC
Blood Product Expiration Date: 202105082359
Blood Product Expiration Date: 202105082359
Blood Product Expiration Date: 202105082359
Blood Product Expiration Date: 202105092359
Blood Product Expiration Date: 202105242359
Blood Product Expiration Date: 202105242359
ISSUE DATE / TIME: 202104190842
ISSUE DATE / TIME: 202104190842
ISSUE DATE / TIME: 202104191132
ISSUE DATE / TIME: 202104191132
Unit Type and Rh: 6200
Unit Type and Rh: 6200
Unit Type and Rh: 6200
Unit Type and Rh: 6200
Unit Type and Rh: 6200
Unit Type and Rh: 6200

## 2020-02-24 LAB — TYPE AND SCREEN
ABO/RH(D): A POS
Antibody Screen: NEGATIVE
Unit division: 0
Unit division: 0
Unit division: 0
Unit division: 0
Unit division: 0
Unit division: 0

## 2020-02-24 LAB — CBC
HCT: 35.1 % — ABNORMAL LOW (ref 36.0–46.0)
Hemoglobin: 11.4 g/dL — ABNORMAL LOW (ref 12.0–15.0)
MCH: 29.1 pg (ref 26.0–34.0)
MCHC: 32.5 g/dL (ref 30.0–36.0)
MCV: 89.5 fL (ref 80.0–100.0)
Platelets: 124 10*3/uL — ABNORMAL LOW (ref 150–400)
RBC: 3.92 MIL/uL (ref 3.87–5.11)
RDW: 13.7 % (ref 11.5–15.5)
WBC: 8.2 10*3/uL (ref 4.0–10.5)
nRBC: 0 % (ref 0.0–0.2)

## 2020-02-24 LAB — BASIC METABOLIC PANEL
Anion gap: 11 (ref 5–15)
BUN: 20 mg/dL (ref 6–20)
CO2: 23 mmol/L (ref 22–32)
Calcium: 8.4 mg/dL — ABNORMAL LOW (ref 8.9–10.3)
Chloride: 102 mmol/L (ref 98–111)
Creatinine, Ser: 1.1 mg/dL — ABNORMAL HIGH (ref 0.44–1.00)
GFR calc Af Amer: >60 mL/min (ref >60)
GFR calc non Af Amer: 56 mL/min — ABNORMAL LOW (ref >60)
Glucose, Bld: 188 mg/dL — ABNORMAL HIGH (ref 70–99)
Potassium: 4.1 mmol/L (ref 3.5–5.1)
Sodium: 136 mmol/L (ref 135–145)

## 2020-02-24 LAB — GLUCOSE, CAPILLARY
Glucose-Capillary: 153 mg/dL — ABNORMAL HIGH (ref 70–99)
Glucose-Capillary: 160 mg/dL — ABNORMAL HIGH (ref 70–99)
Glucose-Capillary: 162 mg/dL — ABNORMAL HIGH (ref 70–99)

## 2020-02-24 MED ORDER — LISINOPRIL 5 MG PO TABS: 5.0000 mg | ORAL_TABLET | Freq: Every day | ORAL | 2 refills | Status: DC

## 2020-02-24 MED ORDER — POTASSIUM CHLORIDE CRYS ER 20 MEQ PO TBCR: 20.0000 meq | EXTENDED_RELEASE_TABLET | Freq: Every day | ORAL | 0 refills | Status: DC

## 2020-02-24 MED ORDER — METOPROLOL TARTRATE 25 MG PO TABS: 12.5000 mg | ORAL_TABLET | Freq: Two times a day (BID) | ORAL | 2 refills | Status: DC

## 2020-02-24 MED ORDER — ACETAMINOPHEN 325 MG PO TABS: 650.0000 mg | ORAL_TABLET | Freq: Four times a day (QID) | ORAL | Status: DC | PRN

## 2020-02-24 MED ORDER — FUROSEMIDE 40 MG PO TABS: 40.0000 mg | ORAL_TABLET | Freq: Every day | ORAL | 0 refills | Status: DC

## 2020-02-24 MED ORDER — TRAMADOL HCL 50 MG PO TABS: 50.0000 mg | ORAL_TABLET | ORAL | 0 refills | Status: DC | PRN

## 2020-02-24 MED ORDER — EZETIMIBE 10 MG PO TABS: 10.0000 mg | ORAL_TABLET | Freq: Every day | ORAL | 2 refills | Status: DC

## 2020-02-24 MED ORDER — INSULIN GLARGINE 100 UNIT/ML ~~LOC~~ SOLN
6.0000 [IU] | Freq: Every day | SUBCUTANEOUS | Status: DC
  Administered 2020-02-24: 10:00:00 6 [IU] via SUBCUTANEOUS
  Filled 2020-02-24: qty 0.06

## 2020-02-24 MED ORDER — ASPIRIN 325 MG PO TBEC: 325.0000 mg | DELAYED_RELEASE_TABLET | Freq: Every day | ORAL | 0 refills | Status: DC

## 2020-02-24 MED FILL — Electrolyte-R (PH 7.4) Solution: INTRAVENOUS | Qty: 4000 | Status: AC

## 2020-02-24 MED FILL — Sodium Chloride IV Soln 0.9%: INTRAVENOUS | Qty: 2000 | Status: AC

## 2020-02-24 MED FILL — Albumin, Human Inj 5%: INTRAVENOUS | Qty: 250 | Status: AC

## 2020-02-24 MED FILL — Heparin Sodium (Porcine) Inj 1000 Unit/ML: INTRAMUSCULAR | Qty: 10 | Status: AC

## 2020-02-24 MED FILL — Mannitol IV Soln 20%: INTRAVENOUS | Qty: 500 | Status: AC

## 2020-02-24 MED FILL — Calcium Chloride Inj 10%: INTRAVENOUS | Qty: 10 | Status: AC

## 2020-02-24 MED FILL — Sodium Bicarbonate IV Soln 8.4%: INTRAVENOUS | Qty: 50 | Status: AC

## 2020-02-24 NOTE — Progress Notes (Signed)
3 Days Post-Op Procedure(s) (LRB): CORONARY ARTERY BYPASS GRAFTING (CABG) times five using left internal mammary and right leg saphenous vein (N/A) TRANSESOPHAGEAL ECHOCARDIOGRAM (TEE) (N/A) Subjective: Feels good, no complaints. She feels she is ready to go home today.   Objective: Vital signs in last 24 hours: Temp:  [98.3 F (36.8 C)-98.7 F (37.1 C)] 98.4 F (36.9 C) (04/22 0807) Pulse Rate:  [59-78] 78 (04/22 0807) Cardiac Rhythm: Normal sinus rhythm (04/22 0807) Resp:  [13-18] 18 (04/22 0807) BP: (115-160)/(56-83) 122/62 (04/22 0807) SpO2:  [93 %-99 %] 93 % (04/22 0807) Weight:  [62.7 kg] 62.7 kg (04/22 0500)   Intake/Output from previous day: 04/21 0701 - 04/22 0700 In: 1200.2 [P.O.:1060; I.V.:40.2; IV Piggyback:100] Out: 300 [Urine:300] Intake/Output this shift: Total I/O In: 120 [P.O.:120] Out: -   General appearance: alert, cooperative and no distress Neurologic: intact Heart: regular rate and rhythm Lungs: Breath sounds clear. O2 sats are adeequate on RA. Extremities: no peripheral edema Wound: the sternal incision is intact and dry.   Lab Results: Recent Labs    02/23/20 0623 02/24/20 0420  WBC 9.8 8.2  HGB 11.7* 11.4*  HCT 37.2 35.1*  PLT 149* 124*   BMET:  Recent Labs    02/23/20 0623 02/24/20 0420  NA 139 136  K 4.5 4.1  CL 105 102  CO2 17* 23  GLUCOSE 119* 188*  BUN 18 20  CREATININE 1.05* 1.10*  CALCIUM 8.6* 8.4*    PT/INR:  Recent Labs    02/21/20 1424  LABPROT 17.5*  INR 1.5*   ABG    Component Value Date/Time   PHART 7.314 (L) 02/21/2020 2053   HCO3 23.3 02/21/2020 2053   TCO2 25 02/21/2020 2053   ACIDBASEDEF 3.0 (H) 02/21/2020 2053   O2SAT 100.0 02/21/2020 2053   CBG (last 3)  Recent Labs    02/23/20 2118 02/24/20 0027 02/24/20 0607  GLUCAP 143* 162* 160*    Assessment/Plan: S/P Procedure(s) (LRB): CORONARY ARTERY BYPASS GRAFTING (CABG) times five using left internal mammary and right leg saphenous vein  (N/A) TRANSESOPHAGEAL ECHOCARDIOGRAM (TEE) (N/A)  -POD3 CABG x 5 for MVCAD and unstable angina pectoris with preserved LV and RV function. She is progressing well and is independent with mobility. She is maintaining acceptable O2 saturation on RA. She is in a stable SR with no significant arrhythmias.  She was evaluated by Dr. [name] earlier this morning and cleared for discharge today. Plan F/U in office in 1 week.   -Type 1 diabetes mellitus- glucose control acceptable. The diabetes coordinator recommends discharge on Lantus [place] 6 units q am and 8 units q pm.  -Expected acute blood loss anemia- Hct stable.   -Dyslipidemia- statin resumed  -History of HTN- reasonable control on metoprolol 12.5mg  po BID and lisinopril 5mg  daily.  Will discharge on these agents.     -Mild volume excess- Wt is only 1kg + from pre-op.  Will continue Lasix x 2 days post discharge.    LOS: 7 days    , Leary Roca [place] 02/24/2020

## 2020-02-24 NOTE — Discharge Instructions (Signed)
[facility]: Phone number [phone]   endoscopic Saphenous Vein Harvesting, Care After This sheet gives you information about how to care for yourself after your procedure. Your health care provider may also give you more specific instructions. If you have problems or questions, contact your health care provider. What can I expect after the procedure? After the procedure, it is common to have:  Pain.  Bruising.  Swelling.  Numbness. Follow these instructions at home: Incision care   Follow instructions from your health care provider about how to take care of your incisions. Make sure you: ? Wash your hands with soap and water before and after you change your bandages (dressings). If soap and water are not available, use hand sanitizer. ? Change your dressings as told by your health care provider. ? Leave stitches (sutures), skin glue, or adhesive strips in place. These skin closures may need to stay in place for 2 weeks or longer. If adhesive strip edges start to loosen and curl up, you may trim the loose edges. Do not remove adhesive strips completely unless your health care provider tells you to do that.  Check your incision areas every day for signs of infection. Check for: ? More redness, swelling, or pain. ? Fluid or blood. ? Warmth. ? Pus or a bad smell. Medicines  Take over-the-counter and prescription medicines only as told by your health care provider.  Ask your health care provider if the medicine prescribed to you requires you to avoid driving or using heavy machinery. General instructions  Raise (elevate) your legs above the level of your heart while you are sitting or lying down.  Avoid crossing your legs.  Avoid sitting for long periods of time. Change positions every 30 minutes.  Do any exercises your health care providers have given you. These may include deep breathing, coughing, and walking exercises.  Do not take baths,  swim, or use a hot tub until your health care provider approves. Ask your health care provider if you may take showers. You may only be allowed to take sponge baths.  Wear compression stockings as told by your health care provider. These stockings help to prevent blood clots and reduce swelling in your legs.  Keep all follow-up visits as told by your health care provider. This is important. Contact a health care provider if:  Medicine does not help your pain.  Your pain gets worse.  You have new leg bruises or your leg bruises get bigger.  Your leg feels numb.  You have more redness, swelling, or pain around your incision.  You have fluid or blood coming from your incision.  Your incision feels warm to the touch.  You have pus or a bad smell coming from your incision.  You have a fever. Get help right away if:  Your pain is severe.  You develop pain, tenderness, warmth, redness, or swelling in any part of your leg.  You have chest pain.  You have trouble breathing. Summary  Raise (elevate) your legs above the level of your heart while you are sitting or lying down.  Wear compression stockings as told by your health care provider.  Make sure you know which symptoms should prompt you to contact your health care provider.  Keep all follow-up visits as told by your health care provider. This information is not intended to replace advice given to you by your health care provider. Make sure you discuss any questions you have with your  health care provider. Document Revised: 09/28/2018 Document Reviewed: 09/28/2018 Elsevier Patient Education  [place]. Coronary Artery Bypass Grafting, Care After This sheet gives you information about how to care for yourself after your procedure. Your doctor may also give you more specific instructions. If you have problems or questions, call your doctor. What can I expect after the procedure? After the procedure, it is common  to:  Feel sick to your stomach (nauseous).  Not want to eat as much as normal (lack of appetite).  Have trouble pooping (constipation).  Have weakness and tiredness (fatigue).  Feel sad (depressed) or grouchy (irritable).  Have pain or discomfort around the cuts from surgery (incisions). Follow these instructions at home: Medicines  Take over-the-counter and prescription medicines only as told by your doctor. Do not stop taking medicines or start any new medicines unless your doctor says it is okay.  If you were prescribed an antibiotic medicine, take it as told by your doctor. Do not stop taking the antibiotic even if you start to feel better. Incision care   Follow instructions from your doctor about how to take care of your cuts from surgery. Make sure you: ? Wash your hands with soap and water before and after you change your bandage (dressing). If you cannot use soap and water, use hand sanitizer. ? Change your bandage as told by your doctor. ? Leave stitches (sutures), skin glue, or skin tape (adhesive) strips in place. They may need to stay in place for 2 weeks or longer. If tape strips get loose and curl up, you may trim the loose edges. Do not remove tape strips completely unless your doctor says it is okay.  Make sure the surgery cuts are clean, dry, and protected.  Check your cut areas every day for signs of infection. Check for: ? More redness, swelling, or pain. ? More fluid or blood. ? Warmth. ? Pus or a bad smell.  If cuts were made in your legs: ? Avoid crossing your legs. ? Avoid sitting for long periods of time. Change positions every 30 minutes. ? Raise (elevate) your legs when you are sitting. Bathing  Do not take baths, swim, or use a hot tub until your doctor says it is okay.  You may take a shower. Pat the surgery cuts dry. Do not rub the cuts to dry. Eating and drinking   Eat foods that are high in fiber, such as beans, nuts, whole grains, and  raw fruits and vegetables. Any meats you eat should be lean cut. Avoid canned, processed, and fried foods. This can help prevent trouble pooping. This is also a part of a heart-healthy diet.  Drink enough fluid to keep your pee (urine) pale yellow.  Do not drink alcohol until you are fully recovered. Ask your doctor when it is safe to drink alcohol. Activity  Rest and limit your activity as told by your doctor. You may be told to: ? Stop any activity right away if you have chest pain, shortness of breath, irregular heartbeats, or dizziness. Get help right away if you have any of these symptoms. ? Move around often for short periods or take short walks as told by your doctor. Slowly increase your activities. ? Avoid lifting, pushing, or pulling anything that is heavier than 10 lb (4.5 kg) for at least 6 weeks or as told by your doctor.  Do physical therapy or a cardiac rehab (cardiac rehabilitation) program as told by your doctor. ? Physical therapy involves  doing exercises to maintain movement and build strength and endurance. ? A cardiac rehab program includes:  Exercise training.  Education.  Counseling.  Do not drive until your doctor says it is okay.  Ask your doctor when you can go back to work.  Ask your doctor when you can be sexually active. General instructions  Do not drive or use heavy machinery while taking prescription pain medicine.  Do not use any products that contain nicotine or tobacco. These include cigarettes, e-cigarettes, and chewing tobacco. If you need help quitting, ask your doctor.  Take 2-3 deep breaths every few hours during the day while you get better. This helps expand your lungs and prevent problems.  If you were given a device called an incentive spirometer, use it several times a day to practice deep breathing. Support your chest with a pillow or your arms when you take deep breaths or cough.  Wear compression stockings as told by your  doctor.  Weigh yourself every day. This helps to see if your body is holding (retaining) fluid that may make your heart and lungs work harder.  Keep all follow-up visits as told by your doctor. This is important. Contact a doctor if:  You have more redness, swelling, or pain around any cut.  You have more fluid or blood coming from any cut.  Any cut feels warm to the touch.  You have pus or a bad smell coming from any cut.  You have a fever.  You have swelling in your ankles or legs.  You have pain in your legs.  You gain 2 lb (0.9 kg) or more a day.  You feel sick to your stomach or you throw up (vomit).  You have watery poop (diarrhea). Get help right away if:  You have chest pain that goes to your jaw or arms.  You are short of breath.  You have a fast or irregular heartbeat.  You notice a "clicking" in your breastbone (sternum) when you move.  You have any signs of a stroke. "BE FAST" is an easy way to remember the main warning signs: ? B - Balance. Signs are dizziness, sudden trouble walking, or loss of balance. ? E - Eyes. Signs are trouble seeing or a change in how you see. ? F - Face. Signs are sudden weakness or loss of feeling of the face, or the face or eyelid drooping on one side. ? A - Arms. Signs are weakness or loss of feeling in an arm. This happens suddenly and usually on one side of the body. ? S - Speech. Signs are sudden trouble speaking, slurred speech, or trouble understanding what people say. ? T - Time. Time to call emergency services. Write down what time symptoms started.  You have other signs of a stroke, such as: ? A sudden, very bad headache with no known cause. ? Feeling sick to your stomach. ? Throwing up. ? Jerky movements you cannot control (seizure). These symptoms may be an emergency. Do not wait to see if the symptoms will go away. Get medical help right away. Call your local emergency services (911 in the [place] Do not drive  yourself to the hospital. Summary  After the procedure, it is common to have pain or discomfort in the cuts from surgery (incisions).  Do not take baths, swim, or use a hot tub until your doctor says it is okay.  Slowly increase your activities. You may need physical therapy or cardiac rehab.  Weigh  yourself every day. This helps to see if your body is holding fluid. This information is not intended to replace advice given to you by your health care provider. Make sure you discuss any questions you have with your health care provider. Document Revised: 06/30/2018 Document Reviewed: 06/30/2018 Elsevier Patient Education  2020 [employer].  Discharge Instructions:  1. You may shower, please wash incisions daily with soap and water and keep dry.  If you wish to cover wounds with dressing you may do so but please keep clean and change daily.  No tub baths or swimming until incisions have completely healed.  If your incisions become red or develop any drainage please call our office at [phone]  2. No Driving until cleared by Dr. Lucilla Lame office and you are no longer using narcotic pain medications  3. Monitor your weight daily.. Please use the same scale and weigh at same time... If you gain 5-10 lbs in 48 hours with associated lower extremity swelling, please contact our office at [phone]  4. Fever of 101.5 for at least 24 hours with no source, please contact our office at [phone]  5. Activity- up as tolerated, please walk at least 3 times per day.  Avoid strenuous activity, no lifting, pushing, or pulling with your arms over 8-10 lbs for a minimum of 6 weeks  6. If any questions or concerns arise, please do not hesitate to contact our office at [phone]

## 2020-02-24 NOTE — Progress Notes (Addendum)
Inpatient Diabetes Program Recommendations  AACE/ADA: New Consensus Statement on Inpatient Glycemic Control (2015)  Target Ranges:  Prepandial:   less than 140 mg/dL      Peak postprandial:   less than 180 mg/dL (1-2 hours)      Critically ill patients:  140 - 180 mg/dL   Lab Results  Component Value Date   GLUCAP 160 (H) 02/24/2020   HGBA1C 8.9 (H) 02/17/2020    Review of Glycemic Control Results for Felicia [name], Felicia [name] (MRN [id number]) as of 02/24/2020 08:39  Ref. Range 02/23/2020 11:41 02/23/2020 17:12 02/23/2020 21:18 02/24/2020 00:27 02/24/2020 06:07  Glucose-Capillary Latest Ref Range: 70 - 99 mg/dL 315 (H) 400 (H) 867 (H) 162 (H) 160 (H)   Diabetes history: Type 1 DM for 46 years Home dosages: (per patient) Lantus 8 units at HS, Lantus 10 units daily  Custom correction scale: CBGs: 150-210 = 1 units 211-270 = 2 units 271-330 = 3 units 331-390 = 4 units 391-450 - 5 units  Meal coverage: 1 unit/10 grams CHO (breakfast and lunch) 1 unit/20 grams CHO (dinner and snack) Current orders for Inpatient glycemic control:  Novolog 0-6 units tid with meals, Novolog 3 units tid with meals, Lantus 8 units q HS  Inpatient Diabetes Program Recommendations:    Please consider adding Lantus 6 units q AM.    Thanks,  Beryl Meager, RN, BC-ADM Inpatient Diabetes Coordinator Pager [phone] (8a-5p)  0930- Called and discussed with PA, Roddenbery- Orders received.  Recommend adding Lantus 6 units q AM today.  Patient is supposed to d/c home today.  Recommend resumption of home insulin with possible reduction in Lantus to 6 units in the AM and 8 units in the PM.  Patient is very savvy with her diabetes in terms of CHO counting, adjustments, etc.

## 2020-02-24 NOTE — Progress Notes (Signed)
Pt walking independently with husband. Discussed sternal precautions, IS, diet, exercise, and CRPII. Very receptive. Will send referral to [facility]. Gave d/c video to view. [phone] Ethelda Chick CES, ACSM 12:01 PM 02/24/2020

## 2020-02-25 ENCOUNTER — Inpatient Hospital Stay (HOSPITAL_COMMUNITY)
Admission: EM | Admit: 2020-02-25 | Discharge: 2020-03-01 | Disposition: A | Discharge status: Home / Self Care | Payer: BC Managed Care – PPO | Source: Home / Self Care | Attending: Internal Medicine | Admitting: Internal Medicine

## 2020-02-25 ENCOUNTER — Other Ambulatory Visit: Payer: Self-pay

## 2020-02-25 ENCOUNTER — Emergency Department (HOSPITAL_COMMUNITY): Payer: BC Managed Care – PPO

## 2020-02-25 DIAGNOSIS — Z951 Presence of aortocoronary bypass graft: Secondary | ICD-10-CM

## 2020-02-25 DIAGNOSIS — R55 Syncope and collapse: Secondary | ICD-10-CM

## 2020-02-25 DIAGNOSIS — S52612A Displaced fracture of left ulna styloid process, initial encounter for closed fracture: Secondary | ICD-10-CM | POA: Diagnosis not present

## 2020-02-25 DIAGNOSIS — S0292XA Unspecified fracture of facial bones, initial encounter for closed fracture: Secondary | ICD-10-CM

## 2020-02-25 DIAGNOSIS — S52502A Unspecified fracture of the lower end of left radius, initial encounter for closed fracture: Secondary | ICD-10-CM | POA: Diagnosis not present

## 2020-02-25 DIAGNOSIS — I2510 Atherosclerotic heart disease of native coronary artery without angina pectoris: Secondary | ICD-10-CM

## 2020-02-25 DIAGNOSIS — S0285XA Fracture of orbit, unspecified, initial encounter for closed fracture: Secondary | ICD-10-CM

## 2020-02-25 DIAGNOSIS — S299XXA Unspecified injury of thorax, initial encounter: Secondary | ICD-10-CM | POA: Diagnosis not present

## 2020-02-25 DIAGNOSIS — I214 Non-ST elevation (NSTEMI) myocardial infarction: Secondary | ICD-10-CM

## 2020-02-25 DIAGNOSIS — I951 Orthostatic hypotension: Secondary | ICD-10-CM | POA: Diagnosis present

## 2020-02-25 DIAGNOSIS — S52509A Unspecified fracture of the lower end of unspecified radius, initial encounter for closed fracture: Secondary | ICD-10-CM

## 2020-02-25 NOTE — ED Provider Notes (Signed)
[facility] EMERGENCY DEPARTMENT Provider Note   CSN: [id number] Arrival date & time: 02/25/20  2248     History Chief Complaint  Patient presents with  . Fall  . Loss of Consciousness    Felicia [name] is a 59 y.o. female.  Patient presents to the emergency department for evaluation after syncopal episode.  Patient reports that she had sudden feeling like she needed to defecate.  She got up to go to the bathroom and felt very weak.  She walked a few feet down the hallway and then the next thing she knew she was on the ground.  She did hit her forehead on the floor and injured her left wrist.  She reports that she was hugging her pillow at the time, does not think that she injured her chest.  She underwent bypass surgery 4 days ago.  She did not have any chest pain prior to the event.  She denies shortness of breath, heart palpitations.  She does report that she felt very dizzy after EMS arrived at the scene and sat her up.  They had to lie her back down briefly, then were able to get her up and she has not had any symptoms since.  No reported arrhythmia or hypotension by EMS.        Past Medical History:  Diagnosis Date  . Allergy   . Anemia    past history long ago  . Borderline HTN (hypertension)   . CAD (coronary artery disease)    a. 02/2020 Cath: LM nl, LAD large, 90p, 51m, D1 90p, LCX nl, OM 60-70p, RCA dominant w/ dampening upon engagement of ostium. 70p/m, RPDA 50. EF 55-65%.  . Disorder of kidney    has abd kidney (right)  . GERD (gastroesophageal reflux disease)   . History of diabetic gastroparesis   . History of echocardiogram    a. 01/2013 Echo: EF 55-65%, no rwma. Mild MR. Nl RV fxn.  Marland Kitchen HLD (hyperlipidemia)   . Hx: UTI (urinary tract infection)   . IBS (irritable bowel syndrome)   . Nephrolithiasis 2005  . Osteopenia 2012   per pt  . Systolic murmur 01/2013   mild mitral insuff  . Type 1 diabetes mellitus with diabetic retinopathy without  macular edema (HCC)    dx at 59 y/o, background retinopathy    Patient Active Problem List   Diagnosis Date Noted  . Syncope 02/26/2020  . S/P CABG (coronary artery bypass graft) 02/21/2020  . Unstable angina (HCC) 02/17/2020  . Chest tightness 02/07/2020  . Chest pain of uncertain etiology 02/07/2020  . External otitis of right ear 08/31/2019  . Reflux esophagitis 06/28/2019  . Left foot pain 08/07/2018  . Allergic sinusitis 01/27/2018  . Thoracic back pain 07/02/2017  . Dysphagia 12/01/2015  . Skin rash 07/19/2014  . Systolic murmur 12/07/2012  . Diarrhea 10/19/2012  . Leg pain, bilateral 07/03/2011  . Type 1 diabetes mellitus with mild nonproliferative retinopathy of both eyes (HCC) 06/25/2010  . Hyperlipidemia due to type 1 diabetes mellitus (HCC) 06/25/2010  . Essential hypertension 06/25/2010  . Diabetic gastroparesis associated with type 1 diabetes mellitus (HCC) 06/25/2010  . NEPHROLITHIASIS, HX OF 06/25/2010    Past Surgical History:  Procedure Laterality Date  . CESAREAN SECTION  1985  . COLONOSCOPY  07/2016   WNL (Pyrtle)  . CORONARY ARTERY BYPASS GRAFT N/A 02/21/2020   Procedure: CORONARY ARTERY BYPASS GRAFTING (CABG) times five using left internal mammary and right leg saphenous  vein;  Surgeon: Corliss Skains, MD;  Location: [facility] OR;  Service: Open Heart Surgery;  Laterality: N/A;  . ESOPHAGOGASTRODUODENOSCOPY  07/2016   LA Grade C reflux esophagitis, concern for stasis/dysmotility (Pyrtle)  . LAPAROSCOPIC APPENDECTOMY N/A 09/01/2015   Procedure: APPENDECTOMY LAPAROSCOPIC;  Surgeon: Jimmye Norman, MD;  Location: [facility] OR;  Service: General;  Laterality: N/A;  . LEFT HEART CATH AND CORONARY ANGIOGRAPHY N/A 02/17/2020   Procedure: LEFT HEART CATH AND CORONARY ANGIOGRAPHY;  Surgeon: Antonieta Iba, MD;  Location: [facility] INVASIVE CV LAB;  Service: Cardiovascular;  Laterality: N/A;  . ORIF FEMUR FRACTURE Left 1996   Corrected by patient in history 2016  . PARTIAL  HYSTERECTOMY  2000   paps by [facility] [place]  . TEE WITHOUT CARDIOVERSION N/A 02/21/2020   Procedure: TRANSESOPHAGEAL ECHOCARDIOGRAM (TEE);  Surgeon: Corliss Skains, MD;  Location: [facility] OR;  Service: Open Heart Surgery;  Laterality: N/A;  . TONSILLECTOMY AND ADENOIDECTOMY  1975  . TRIGGER FINGER RELEASE Left 12/2010   thumb     OB History   No obstetric history on file.     Family History  Problem Relation Age of Onset  . Diabetes Maternal Grandmother   . Heart failure Maternal Grandmother   . COPD Maternal Grandmother        smoker  . Thyroid disease Maternal Grandmother   . Vascular Disease Maternal Grandmother   . Colon cancer Maternal Grandfather   . Colon polyps Mother   . Breast cancer Other   . CAD Brother 52       stent  . Esophageal cancer Neg Hx   . Rectal cancer Neg Hx   . Stomach cancer Neg Hx     Social History   Tobacco Use  . Smoking status: Never Smoker  . Smokeless tobacco: Never Used  Substance Use Topics  . Alcohol use: No    Alcohol/week: 0.0 standard drinks  . Drug use: No    Home Medications Prior to Admission medications   Medication Sig Start Date End Date Taking? Authorizing Provider  acetaminophen (TYLENOL) 325 MG tablet Take 2 tablets (650 mg total) by mouth every 6 (six) hours as needed for mild pain. 02/24/20  Yes Leary Roca, PA-C  aspirin EC 325 MG EC tablet Take 1 tablet (325 mg total) by mouth daily. 02/25/20  Yes Roddenberry, Cecille Amsterdam, PA-C  esomeprazole (NEXIUM) 40 MG capsule Take 1 capsule (40 mg total) by mouth daily. Patient needs office visit for further refills 04/26/19  Yes Pyrtle, Carie Caddy, MD  ezetimibe (ZETIA) 10 MG tablet Take 1 tablet (10 mg total) by mouth daily. 02/25/20  Yes Roddenberry, Cecille Amsterdam, PA-C  furosemide (LASIX) 40 MG tablet Take 1 tablet (40 mg total) by mouth daily for 2 days. 02/25/20 02/27/20 Yes Roddenberry, Cecille Amsterdam, PA-C  glucagon (GLUCAGON EMERGENCY) 1 MG injection Inject 1 mg into the muscle  once as needed (in case of severe hypoglycemia). 09/04/18  Yes Carlus Pavlov, MD  Insulin Glargine (LANTUS SOLOSTAR) 100 UNIT/ML Solostar Pen Inject 12 Units into the skin daily. Patient taking differently: Inject 8-10 Units into the skin See admin instructions. Inject 10 units into the skin in the morning and 8 units in the evening 10/25/19  Yes Carlus Pavlov, MD  insulin regular (NOVOLIN R) 100 units/mL injection INJECT 6 UNITS TOTAL INTO THE SKIN 3 (THREE) TIMES DAILY BEFORE MEALS. Patient taking differently: Inject 1-10 Units into the skin See admin instructions. For glucose 150-200 add 1 additional unit,  adding 1 unit with increasing BG by 60 points.   For breakfast and dinner, administer 1 unit for every 10 grams of carbs. For snacks and dinner, administer 1 unit for every 20 grams of carbs. 08/06/19  Yes Carlus PavlovGherghe, Cristina, MD  lisinopril (ZESTRIL) 5 MG tablet Take 1 tablet (5 mg total) by mouth daily. 02/25/20  Yes Roddenberry, Cecille AmsterdamMyron G, PA-C  metoprolol tartrate (LOPRESSOR) 25 MG tablet Take 0.5 tablets (12.5 mg total) by mouth 2 (two) times daily. 02/24/20  Yes Roddenberry, Cecille AmsterdamMyron G, PA-C  Multiple Vitamin (MULTIVITAMIN) tablet Take 1 tablet by mouth daily.     Yes [provider]  potassium chloride SA (KLOR-CON) 20 MEQ tablet Take 1 tablet (20 mEq total) by mouth daily for 2 days. 02/25/20 02/27/20 Yes Roddenberry, Myron G, PA-C  pravastatin (PRAVACHOL) 40 MG tablet TAKE 1.5 TABLETS (60 MG TOTAL) BY MOUTH DAILY. Patient taking differently: Take 60 mg by mouth daily.  02/24/20  Yes Eustaquio BoydenGutierrez, Javier, MD  traMADol (ULTRAM) 50 MG tablet Take 1 tablet (50 mg total) by mouth every 4 (four) hours as needed for up to 7 days for moderate pain. 02/24/20 03/02/20 Yes Roddenberry, Cecille AmsterdamMyron G, PA-C  Insulin Pen Needle (NOVOFINE) 32G X 6 MM MISC USE TO INJECT INSULIN 4 TIMES A DAY 05/24/19   Carlus PavlovGherghe, Cristina, MD  Insulin Syringe-Needle U-100 30G X 5/16" 0.3 ML MISC Use to inject insulin 6 times a  day. 01/07/20   Carlus PavlovGherghe, Cristina, MD    Allergies    Protonix [pantoprazole], Toujeo solostar [insulin glargine], Lactose intolerance (gi), Magnesium-containing compounds, and Adhesive [tape]  Review of Systems   Review of Systems  Respiratory: Negative for shortness of breath.   Cardiovascular: Negative for chest pain and palpitations.  All other systems reviewed and are negative.   Physical Exam Updated Vital Signs BP 135/75   Pulse 85   Temp 98.5 F (36.9 C) (Oral)   Resp (!) 23   SpO2 96%   Physical Exam Vitals and nursing note reviewed.  Constitutional:      General: She is not in acute distress.    Appearance: Normal appearance. She is well-developed.  HENT:     Head: Normocephalic. Abrasion (forehead) and contusion (forehead) present.     Right Ear: Hearing normal.     Left Ear: Hearing normal.     Nose: Nose normal.  Eyes:     Conjunctiva/sclera: Conjunctivae normal.     Pupils: Pupils are equal, round, and reactive to light.  Cardiovascular:     Rate and Rhythm: Regular rhythm.     Heart sounds: S1 normal and S2 normal. No murmur. No friction rub. No gallop.   Pulmonary:     Effort: Pulmonary effort is normal. No respiratory distress.     Breath sounds: Normal breath sounds.  Chest:     Chest wall: No tenderness.  Abdominal:     General: Bowel sounds are normal.     Palpations: Abdomen is soft.     Tenderness: There is no abdominal tenderness. There is no guarding or rebound. Negative signs include Murphy's sign and McBurney's sign.     Hernia: No hernia is present.  Musculoskeletal:     Left wrist: Swelling and tenderness present. Decreased range of motion.     Cervical back: Normal range of motion and neck supple.  Skin:    General: Skin is warm and dry.     Findings: No rash.  Neurological:     Mental Status: She is alert  and oriented to person, place, and time.     GCS: GCS eye subscore is 4. GCS verbal subscore is 5. GCS motor subscore is 6.      Cranial Nerves: No cranial nerve deficit.     Sensory: No sensory deficit.     Coordination: Coordination normal.  Psychiatric:        Speech: Speech normal.        Behavior: Behavior normal.        Thought Content: Thought content normal.     ED Results / Procedures / Treatments   Labs (all labs ordered are listed, but only abnormal results are displayed) Labs Reviewed  BASIC METABOLIC PANEL - Abnormal; Notable for the following components:      Result Value   Chloride 95 (*)    Glucose, Bld 144 (*)    BUN 23 (*)    Creatinine, Ser 1.16 (*)    GFR calc non Af Amer 52 (*)    All other components within normal limits  BRAIN NATRIURETIC PEPTIDE - Abnormal; Notable for the following components:   B Natriuretic Peptide 598.5 (*)    All other components within normal limits  CBG MONITORING, ED - Abnormal; Notable for the following components:   Glucose-Capillary 127 (*)    All other components within normal limits  TROPONIN I (HIGH SENSITIVITY) - Abnormal; Notable for the following components:   Troponin I (High Sensitivity) 4,550 (*)    All other components within normal limits  RESPIRATORY PANEL BY RT PCR (FLU A&B, COVID)  CBC  TROPONIN I (HIGH SENSITIVITY)    EKG EKG Interpretation  Date/Time:  Friday February 25 2020 22:50:11 EDT Ventricular Rate:  84 PR Interval:    QRS Duration: 74 QT Interval:  356 QTC Calculation: 421 R Axis:   72 Text Interpretation: Sinus rhythm Low voltage, extremity leads No significant change since last tracing Confirmed by Orpah Greek [phone]) on 02/25/2020 11:04:33 PM   Radiology DG Wrist Complete Left  Result Date: 02/25/2020 CLINICAL DATA:  Fall EXAM: LEFT WRIST - COMPLETE 3+ VIEW COMPARISON:  None. FINDINGS: There is a slightly impacted nondisplaced distal radius fracture seen. A mildly displaced ulnar styloid fracture is noted. Diffuse overlying soft tissue swelling is seen. There is diffuse osteopenia noted. IMPRESSION:  Slightly impacted nondisplaced distal radius fracture. Mildly displaced ulnar styloid fracture. Electronically Signed   By: Prudencio Pair M.D.   On: 02/25/2020 23:40   CT HEAD WO CONTRAST  Result Date: 02/26/2020 CLINICAL DATA:  Headache, posttraumatic EXAM: CT HEAD WITHOUT CONTRAST TECHNIQUE: Contiguous axial images were obtained from the base of the skull through the vertex without intravenous contrast. COMPARISON:  None. FINDINGS: Brain: No evidence of acute territorial infarction, hemorrhage, hydrocephalus,extra-axial collection or mass lesion/mass effect. Normal gray-white differentiation. Ventricles are normal in size and contour. Vascular: No hyperdense vessel or unexpected calcification. Skull: The skull is intact. There is a mildly displaced obliquely oriented fracture seen through the lateral orbital wall. There is a comminuted mildly impacted fracture seen through the lateral maxillary wall and inferior orbital floor extends through the infraorbital foramen. No displacement of the inferior orbital fat. There is small foci of air seen within the inferior orbit soft tissues. Sinuses/Orbits: The visualized paranasal sinuses and mastoid air cells are clear. The orbits and globes intact. Other: Right periorbital soft tissue swelling is seen. There is also soft tissue swelling seen over the right frontal skull. The orbits however appear to be intact. No retro-orbital fluid collections are  noted. IMPRESSION: No acute intracranial abnormality. Nondisplaced comminuted fractures through the lateral orbital wall, lateral maxillary wall common inferior orbital floor. No displacement of the inferior orbital fat. Soft tissue swelling seen over the right frontal skull and right periorbital soft tissues. Electronically Signed   By: Jonna Clark M.D.   On: 02/26/2020 01:03   DG Chest Port 1 View  Result Date: 02/25/2020 CLINICAL DATA:  Larey Seat, syncope, recent CABG EXAM: PORTABLE CHEST 1 VIEW COMPARISON:  02/23/2020  FINDINGS: Single frontal view of the chest demonstrates stable postsurgical changes from bypass surgery. The cardiac silhouette is stable. Improved volume status, with resolution of airspace disease and effusions seen previously. Minimal scarring or atelectasis at the left base. No pneumothorax. No acute bony abnormalities. IMPRESSION: 1. Minimal left basilar atelectasis. 2. No acute intrathoracic process. Electronically Signed   By: Sharlet [name] M.D.   On: 02/25/2020 23:41    Procedures Procedures (including critical care time)  Medications Ordered in ED Medications  sodium chloride flush (NS) 0.9 % injection 10-40 mL (has no administration in time range)  sodium chloride flush (NS) 0.9 % injection 10-40 mL (has no administration in time range)    ED Course  I have reviewed the triage vital signs and the nursing notes.  Pertinent labs & imaging results that were available during my care of the patient were reviewed by me and considered in my medical decision making (see chart for details).    MDM Rules/Calculators/A&P                      Patient presents to the emergency department for evaluation of syncope.  Patient underwent 5 vessel bypass 4 days ago.  She was convalescing well at home.  She got up to go to the bathroom tonight and then passed out.  They do not appear to be any warning signs of the syncope.  She did not, however, have any chest pain or sensation of palpitations or shortness of breath either.  Patient complaining of headache and left wrist pain at arrival.  CT head does show some facial fractures that are nondisplaced.  No evidence of ocular entrapment on examination.  These will not require any urgent evaluation or intervention, can follow-up with ophthalmology (Dr. Vonna Kotyk on call) and oral surgery (Dr. Valda Favia on call) as an outpatient.  Patient has evidence of an impacted distal radius fracture with ulnar styloid fracture. Discussed with Dr. Janee Morn, on call  for hand surgery. Apply sugar tong splint, will need follow up in office in one week.  Patient is EKG does not suggest ischemia or infarct.  She does, however, have an elevated troponin.  Significance and etiology of this is unclear.  Some of this might be postsurgical, but there would be concern for arrhythmia causing her syncope.  Discussed with Dr. Meredeth Ide, on-call for cardiology.  She will consult on the patient but asked for medicine admission.  Initiate heparin.  Final Clinical Impression(s) / ED Diagnoses Final diagnoses:  Syncope, unspecified syncope type  Closed fracture of facial bone, unspecified facial bone, initial encounter (HCC)  Closed fracture of distal end of left radius, unspecified fracture morphology, initial encounter    Rx / DC Orders ED Discharge Orders    None       Dalis Beers, Canary Brim, MD 02/26/20 [phone]

## 2020-02-25 NOTE — ED Triage Notes (Addendum)
Pt presents from home for fall and syncope s/p quintuple bypass on Monday.  Pt reports urge to defecate prior to syncope, hit R forehead and L wrist. Hematoma to R forehead, L wrist is painful to perform ROM, able to move fingers, no n/t. Denies CP, dizziness, weakness prior to fall.  EMS exam: EKG unremark, 276CBG, 115/68, 18RR, 96% RA. Neg orthostatics (nausea reported with sitting that resolved lying down)  Pt declines EMS IV, states that she has to have midlines placed, would like phlebotomy to draw blood.   H/o DM 1  Last received lovenox and heparin on Tues, otherwise no blood thinners

## 2020-02-25 NOTE — ED Notes (Signed)
Pt given ice pack for her head because she said it was hurting.

## 2020-02-26 ENCOUNTER — Emergency Department (HOSPITAL_COMMUNITY): Payer: BC Managed Care – PPO

## 2020-02-26 DIAGNOSIS — R55 Syncope and collapse: Secondary | ICD-10-CM | POA: Diagnosis not present

## 2020-02-26 DIAGNOSIS — S52502A Unspecified fracture of the lower end of left radius, initial encounter for closed fracture: Secondary | ICD-10-CM | POA: Diagnosis not present

## 2020-02-26 DIAGNOSIS — S0285XA Fracture of orbit, unspecified, initial encounter for closed fracture: Secondary | ICD-10-CM

## 2020-02-26 DIAGNOSIS — S52509A Unspecified fracture of the lower end of unspecified radius, initial encounter for closed fracture: Secondary | ICD-10-CM

## 2020-02-26 DIAGNOSIS — I2510 Atherosclerotic heart disease of native coronary artery without angina pectoris: Secondary | ICD-10-CM

## 2020-02-26 DIAGNOSIS — I951 Orthostatic hypotension: Secondary | ICD-10-CM | POA: Diagnosis present

## 2020-02-26 DIAGNOSIS — G44309 Post-traumatic headache, unspecified, not intractable: Secondary | ICD-10-CM | POA: Diagnosis not present

## 2020-02-26 DIAGNOSIS — S52602A Unspecified fracture of lower end of left ulna, initial encounter for closed fracture: Secondary | ICD-10-CM

## 2020-02-26 DIAGNOSIS — R7989 Other specified abnormal findings of blood chemistry: Secondary | ICD-10-CM | POA: Diagnosis not present

## 2020-02-26 HISTORY — DX: Fracture of orbit, unspecified, initial encounter for closed fracture: S02.85XA

## 2020-02-26 HISTORY — DX: Unspecified fracture of the lower end of unspecified radius, initial encounter for closed fracture: S52.509A

## 2020-02-26 LAB — BASIC METABOLIC PANEL
Anion gap: 14 (ref 5–15)
BUN: 23 mg/dL — ABNORMAL HIGH (ref 6–20)
CO2: 29 mmol/L (ref 22–32)
Calcium: 9.3 mg/dL (ref 8.9–10.3)
Chloride: 95 mmol/L — ABNORMAL LOW (ref 98–111)
Creatinine, Ser: 1.16 mg/dL — ABNORMAL HIGH (ref 0.44–1.00)
GFR calc Af Amer: >60 mL/min (ref >60)
GFR calc non Af Amer: 52 mL/min — ABNORMAL LOW (ref >60)
Glucose, Bld: 144 mg/dL — ABNORMAL HIGH (ref 70–99)
Potassium: 4 mmol/L (ref 3.5–5.1)
Sodium: 138 mmol/L (ref 135–145)

## 2020-02-26 LAB — TROPONIN I (HIGH SENSITIVITY)
Troponin I (High Sensitivity): 4550 ng/L (ref <18)
Troponin I (High Sensitivity): 3850 ng/L (ref <18)
Troponin I (High Sensitivity): 2678 ng/L (ref <18)
Troponin I (High Sensitivity): 2865 ng/L (ref <18)
Troponin I (High Sensitivity): 4160 ng/L (ref <18)
Troponin I (High Sensitivity): 5926 ng/L (ref <18)

## 2020-02-26 LAB — CBC
HCT: 41.2 % (ref 36.0–46.0)
Hemoglobin: 13.4 g/dL (ref 12.0–15.0)
MCH: 28.9 pg (ref 26.0–34.0)
MCHC: 32.5 g/dL (ref 30.0–36.0)
MCV: 89 fL (ref 80.0–100.0)
Platelets: 247 10*3/uL (ref 150–400)
RBC: 4.63 MIL/uL (ref 3.87–5.11)
RDW: 13.3 % (ref 11.5–15.5)
WBC: 9.5 10*3/uL (ref 4.0–10.5)
nRBC: 0 % (ref 0.0–0.2)

## 2020-02-26 LAB — CBG MONITORING, ED
Glucose-Capillary: 124 mg/dL — ABNORMAL HIGH (ref 70–99)
Glucose-Capillary: 127 mg/dL — ABNORMAL HIGH (ref 70–99)
Glucose-Capillary: 147 mg/dL — ABNORMAL HIGH (ref 70–99)
Glucose-Capillary: 161 mg/dL — ABNORMAL HIGH (ref 70–99)
Glucose-Capillary: 74 mg/dL (ref 70–99)

## 2020-02-26 LAB — BRAIN NATRIURETIC PEPTIDE: B Natriuretic Peptide: 598.5 pg/mL — ABNORMAL HIGH (ref 0.0–100.0)

## 2020-02-26 LAB — GLUCOSE, CAPILLARY: Glucose-Capillary: 130 mg/dL — ABNORMAL HIGH (ref 70–99)

## 2020-02-26 LAB — RESPIRATORY PANEL BY RT PCR (FLU A&B, COVID)
Influenza A by PCR: NEGATIVE
Influenza B by PCR: NEGATIVE
SARS Coronavirus 2 by RT PCR: NEGATIVE

## 2020-02-26 LAB — HIV ANTIBODY (ROUTINE TESTING W REFLEX): HIV Screen 4th Generation wRfx: NONREACTIVE

## 2020-02-26 MED ORDER — ACETAMINOPHEN 325 MG PO TABS
650.0000 mg | ORAL_TABLET | Freq: Four times a day (QID) | ORAL | Status: DC | PRN
  Administered 2020-02-29: 650 mg via ORAL
  Filled 2020-02-26: qty 2

## 2020-02-26 MED ORDER — INSULIN GLARGINE 100 UNIT/ML ~~LOC~~ SOLN
10.0000 [IU] | Freq: Every day | SUBCUTANEOUS | Status: DC
  Administered 2020-02-26 – 2020-03-01 (×5): 10 [IU] via SUBCUTANEOUS
  Filled 2020-02-26 (×5): qty 0.1

## 2020-02-26 MED ORDER — PRAVASTATIN SODIUM 40 MG PO TABS
60.0000 mg | ORAL_TABLET | Freq: Every day | ORAL | Status: DC
  Administered 2020-02-26: 40 mg via ORAL
  Filled 2020-02-26 (×6): qty 1

## 2020-02-26 MED ORDER — LISINOPRIL 5 MG PO TABS
5.0000 mg | ORAL_TABLET | Freq: Every day | ORAL | Status: DC
  Administered 2020-02-26: 5 mg via ORAL
  Filled 2020-02-26 (×2): qty 1

## 2020-02-26 MED ORDER — INSULIN ASPART 100 UNIT/ML ~~LOC~~ SOLN
0.0000 [IU] | Freq: Three times a day (TID) | SUBCUTANEOUS | Status: DC
  Administered 2020-02-26: 14:00:00 2 [IU] via SUBCUTANEOUS
  Administered 2020-02-26 – 2020-02-27 (×2): 1 [IU] via SUBCUTANEOUS
  Administered 2020-02-27: 2 [IU] via SUBCUTANEOUS
  Administered 2020-02-28 (×2): 3 [IU] via SUBCUTANEOUS
  Administered 2020-02-28: 2 [IU] via SUBCUTANEOUS

## 2020-02-26 MED ORDER — HEPARIN (PORCINE) 25000 UT/250ML-% IV SOLN
800.0000 [IU]/h | INTRAVENOUS | Status: DC
  Filled 2020-02-26: qty 250

## 2020-02-26 MED ORDER — SODIUM CHLORIDE 0.9% FLUSH: 10.0000 mL | INTRAVENOUS | Status: DC | PRN

## 2020-02-26 MED ORDER — ASPIRIN EC 81 MG PO TBEC
81.0000 mg | DELAYED_RELEASE_TABLET | Freq: Every day | ORAL | Status: DC
  Administered 2020-02-26 – 2020-03-01 (×5): 81 mg via ORAL
  Filled 2020-02-26 (×5): qty 1

## 2020-02-26 MED ORDER — ACETAMINOPHEN 650 MG RE SUPP: 650.0000 mg | Freq: Four times a day (QID) | RECTAL | Status: DC | PRN

## 2020-02-26 MED ORDER — ADULT MULTIVITAMIN W/MINERALS CH
1.0000 | ORAL_TABLET | Freq: Every day | ORAL | Status: DC
  Administered 2020-02-26 – 2020-02-29 (×3): 1 via ORAL
  Filled 2020-02-26 (×5): qty 1

## 2020-02-26 MED ORDER — EZETIMIBE 10 MG PO TABS
10.0000 mg | ORAL_TABLET | Freq: Every day | ORAL | Status: DC
  Administered 2020-02-26 – 2020-03-01 (×5): 10 mg via ORAL
  Filled 2020-02-26 (×5): qty 1

## 2020-02-26 MED ORDER — SODIUM CHLORIDE 0.9% FLUSH
10.0000 mL | Freq: Two times a day (BID) | INTRAVENOUS | Status: DC
  Administered 2020-02-26 – 2020-03-01 (×6): 10 mL

## 2020-02-26 MED ORDER — METOPROLOL TARTRATE 12.5 MG HALF TABLET
12.5000 mg | ORAL_TABLET | Freq: Two times a day (BID) | ORAL | Status: DC
  Administered 2020-02-26: 12.5 mg via ORAL
  Filled 2020-02-26 (×2): qty 1

## 2020-02-26 MED ORDER — INSULIN GLARGINE 100 UNIT/ML SOLOSTAR PEN: 8.0000 [IU] | PEN_INJECTOR | SUBCUTANEOUS | Status: DC

## 2020-02-26 MED ORDER — INSULIN GLARGINE 100 UNIT/ML ~~LOC~~ SOLN
8.0000 [IU] | Freq: Every day | SUBCUTANEOUS | Status: DC
  Administered 2020-02-26 – 2020-02-29 (×4): 8 [IU] via SUBCUTANEOUS
  Filled 2020-02-26 (×6): qty 0.08

## 2020-02-26 NOTE — ED Notes (Signed)
Waiting for EDP to determine POC before drawing blood (?IV)

## 2020-02-26 NOTE — ED Notes (Signed)
Felicia [name], mother, [phone] would like an update when available

## 2020-02-26 NOTE — Progress Notes (Signed)
Made aware of patient by Dr. Oletta Cohn.  Radiographs reviewed. Agree with sugartong splint.  F/U with me needed in approx one week to repeat radiographs to determine no change in fracture alignment.  Neil Crouch, MD [facility] [phone]

## 2020-02-26 NOTE — ED Notes (Signed)
Pt was taken to CT

## 2020-02-26 NOTE — Consult Note (Signed)
Cardiology Consultation:   Patient ID: Felicia [name] MRN: [id number]; DOB: 03/17/1961  Admit date: 02/25/2020 Date of Consult: 02/26/2020  Primary Care Provider: [name], MD Primary Cardiologist: Dr. Allyson Sabal Primary Electrophysiologist:  None    Patient Profile:   Felicia [name] is a 59 y.o. female with a hx of type 1 DM, HTN, HLD, and CAD s/p 5v CABG on 02/21/20 who is being seen today for the evaluation of syncope and elevated troponin at the request of the ER.  History of Present Illness:   Felicia [name] was discharged two days ago after an uncomplicated 5v CABG. She has been feeling well since returning home. She has been slowly working on rehabing without any difficulty. Earlier this evening, she woke up and felt the need to go to the bathroom. She stood up to walk down the hallway. Per her and her husband, she reports feeling unwell and then immediately falling face first directly onto the floor. EMS was called and she felt like she was "racing through the air" when they tried to lift her. She was slowly able to sit up and continued to feel off but slowly improved. She feels nearly back to baseline at this time but is concerned about what happened. She denies any current palpitations, shortness of breath, chest pain, lightheadedness, dizziness, lower leg swelling, orthopnea or PND. She denies having any prodromal symptoms including chest pain or palpitations. She did not have any arrhythmias in the hospital prior to discharge. Of note, her hsTnT in the ER was significantly elevated at 4550.   Past Medical History:  Diagnosis Date  . Allergy   . Anemia    past history long ago  . Borderline HTN (hypertension)   . CAD (coronary artery disease)    a. 02/2020 Cath: LM nl, LAD large, 90p, 84m, D1 90p, LCX nl, OM 60-70p, RCA dominant w/ dampening upon engagement of ostium. 70p/m, RPDA 50. EF 55-65%.  . Disorder of kidney    has abd kidney (right)  . GERD (gastroesophageal reflux  disease)   . History of diabetic gastroparesis   . History of echocardiogram    a. 01/2013 Echo: EF 55-65%, no rwma. Mild MR. Nl RV fxn.  Marland Kitchen HLD (hyperlipidemia)   . Hx: UTI (urinary tract infection)   . IBS (irritable bowel syndrome)   . Nephrolithiasis 2005  . Osteopenia 2012   per pt  . Systolic murmur 01/2013   mild mitral insuff  . Type 1 diabetes mellitus with diabetic retinopathy without macular edema (HCC)    dx at 59 y/o, background retinopathy    Past Surgical History:  Procedure Laterality Date  . CESAREAN SECTION  1985  . COLONOSCOPY  07/2016   WNL (Pyrtle)  . CORONARY ARTERY BYPASS GRAFT N/A 02/21/2020   Procedure: CORONARY ARTERY BYPASS GRAFTING (CABG) times five using left internal mammary and right leg saphenous vein;  Surgeon: Corliss Skains, MD;  Location: [facility] OR;  Service: Open Heart Surgery;  Laterality: N/A;  . ESOPHAGOGASTRODUODENOSCOPY  07/2016   LA Grade C reflux esophagitis, concern for stasis/dysmotility (Pyrtle)  . LAPAROSCOPIC APPENDECTOMY N/A 09/01/2015   Procedure: APPENDECTOMY LAPAROSCOPIC;  Surgeon: Jimmye Norman, MD;  Location: [facility] OR;  Service: General;  Laterality: N/A;  . LEFT HEART CATH AND CORONARY ANGIOGRAPHY N/A 02/17/2020   Procedure: LEFT HEART CATH AND CORONARY ANGIOGRAPHY;  Surgeon: Antonieta Iba, MD;  Location: [facility] INVASIVE CV LAB;  Service: Cardiovascular;  Laterality: N/A;  . ORIF FEMUR FRACTURE Left 1996  Corrected by patient in history 2016  . PARTIAL HYSTERECTOMY  2000   [facility]  . TEE WITHOUT CARDIOVERSION N/A 02/21/2020   Procedure: TRANSESOPHAGEAL ECHOCARDIOGRAM (TEE);  Surgeon: Corliss SkainsLightfoot, Harrell O, MD;  [facility];  Service: Open Heart Surgery;  Laterality: N/A;  . TONSILLECTOMY AND ADENOIDECTOMY  1975  . TRIGGER FINGER RELEASE Left 12/2010   thumb     Home Medications:  Prior to Admission medications   Medication Sig Start Date End Date Taking? Authorizing Provider  acetaminophen (TYLENOL)  325 MG tablet Take 2 tablets (650 mg total) by mouth every 6 (six) hours as needed for mild pain. 02/24/20  Yes Leary Rocaoddenberry, Myron G, PA-C  aspirin EC 325 MG EC tablet Take 1 tablet (325 mg total) by mouth daily. 02/25/20  Yes Roddenberry, Cecille AmsterdamMyron G, PA-C  esomeprazole (NEXIUM) 40 MG capsule Take 1 capsule (40 mg total) by mouth daily. Patient needs office visit for further refills 04/26/19  Yes Pyrtle, Carie CaddyJay M, MD  ezetimibe (ZETIA) 10 MG tablet Take 1 tablet (10 mg total) by mouth daily. 02/25/20  Yes Roddenberry, Cecille AmsterdamMyron G, PA-C  furosemide (LASIX) 40 MG tablet Take 1 tablet (40 mg total) by mouth daily for 2 days. 02/25/20 02/27/20 Yes Roddenberry, Cecille AmsterdamMyron G, PA-C  glucagon (GLUCAGON EMERGENCY) 1 MG injection Inject 1 mg into the muscle once as needed (in case of severe hypoglycemia). 09/04/18  Yes Carlus PavlovGherghe, Cristina, MD  Insulin Glargine (LANTUS SOLOSTAR) 100 UNIT/ML Solostar Pen Inject 12 Units into the skin daily. Patient taking differently: Inject 8-10 Units into the skin See admin instructions. Inject 10 units into the skin in the morning and 8 units in the evening 10/25/19  Yes Carlus PavlovGherghe, Cristina, MD  insulin regular (NOVOLIN R) 100 units/mL injection INJECT 6 UNITS TOTAL INTO THE SKIN 3 (THREE) TIMES DAILY BEFORE MEALS. Patient taking differently: Inject 1-10 Units into the skin See admin instructions. For glucose 150-200 add 1 additional unit, adding 1 unit with increasing BG by 60 points.   For breakfast and dinner, administer 1 unit for every 10 grams of carbs. For snacks and dinner, administer 1 unit for every 20 grams of carbs. 08/06/19  Yes Carlus PavlovGherghe, Cristina, MD  lisinopril (ZESTRIL) 5 MG tablet Take 1 tablet (5 mg total) by mouth daily. 02/25/20  Yes Roddenberry, Cecille AmsterdamMyron G, PA-C  metoprolol tartrate (LOPRESSOR) 25 MG tablet Take 0.5 tablets (12.5 mg total) by mouth 2 (two) times daily. 02/24/20  Yes Roddenberry, Cecille AmsterdamMyron G, PA-C  Multiple Vitamin (MULTIVITAMIN) tablet Take 1 tablet by mouth daily.     Yes  [provider]  potassium chloride SA (KLOR-CON) 20 MEQ tablet Take 1 tablet (20 mEq total) by mouth daily for 2 days. 02/25/20 02/27/20 Yes Roddenberry, Myron G, PA-C  pravastatin (PRAVACHOL) 40 MG tablet TAKE 1.5 TABLETS (60 MG TOTAL) BY MOUTH DAILY. Patient taking differently: Take 60 mg by mouth daily.  02/24/20  Yes Eustaquio BoydenGutierrez, Javier, MD  traMADol (ULTRAM) 50 MG tablet Take 1 tablet (50 mg total) by mouth every 4 (four) hours as needed for up to 7 days for moderate pain. 02/24/20 03/02/20 Yes Roddenberry, Cecille AmsterdamMyron G, PA-C  Insulin Pen Needle (NOVOFINE) 32G X 6 MM MISC USE TO INJECT INSULIN 4 TIMES A DAY 05/24/19   Carlus PavlovGherghe, Cristina, MD  Insulin Syringe-Needle U-100 30G X 5/16" 0.3 ML MISC Use to inject insulin 6 times a day. 01/07/20   Carlus PavlovGherghe, Cristina, MD    Inpatient Medications: Scheduled Meds: . sodium chloride flush  10-40  mL Intracatheter Q12H   Continuous Infusions:  PRN Meds: sodium chloride flush  Allergies:    Allergies  Allergen Reactions  . Protonix [Pantoprazole] Swelling    Facial Swelling  . Toujeo Solostar [Insulin Glargine] Shortness Of Breath  . Lactose Intolerance (Gi)     GI  . Magnesium-Containing Compounds Swelling and Other (See Comments)    limbs  . Adhesive [Tape] Rash and Other (See Comments)    Band aids    Social History:   Social History   Socioeconomic History  . Marital status: Married    Spouse name: Not on file  . Number of children: 1  . Years of education: 11  . Highest education level: Bachelor's degree (e.g., BA, AB, BS)  Occupational History  . 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 Smoking status: Never Smoker  . Smokeless tobacco: Never Used  Substance and Sexual Activity  . Alcohol use: No    Alcohol/week: 0.0 standard drinks  . Drug use: No  . Sexual activity: Not on file  Other Topics Concern  . 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  Has one daughter.  Education: college.      1 dog   Social Determinants of Radio broadcast assistant Strain:   . Difficulty of Paying Living Expenses:   Food Insecurity:   . Worried About Charity fundraiser in the Last Year:   . Arboriculturist in the Last Year:   Transportation Needs:   . Film/video editor (Medical):   Marland Kitchen Lack of Transportation (Non-Medical):   Physical Activity:   . Days of Exercise per Week:   . Minutes of Exercise per Session:   Stress:   . Feeling of Stress :   Social Connections:   . Frequency of Communication with Friends and Family:   . Frequency of Social Gatherings with Friends and Family:   . Attends Religious Services:   . Active Member of Clubs or Organizations:   . Attends Archivist Meetings:   Marland Kitchen Marital Status:   Intimate Partner Violence:   . Fear of Current or Ex-Partner:   . Emotionally Abused:   Marland Kitchen Physically Abused:   . Sexually Abused:     Family History:    Family History  Problem Relation Age of Onset  . Diabetes Maternal Grandmother   . Heart failure Maternal Grandmother   . COPD Maternal Grandmother        smoker  . Thyroid disease Maternal Grandmother   . Vascular Disease Maternal Grandmother   . Colon cancer Maternal Grandfather   . Colon polyps Mother   . Breast cancer Other   . CAD Brother 31       stent  . Esophageal cancer Neg Hx   . Rectal cancer Neg Hx   . Stomach cancer Neg Hx      ROS:  Please see the history of present illness.  All other ROS reviewed and negative.     Physical Exam/Data:   Vitals:   02/26/20 0215 02/26/20 0230 02/26/20 0245 02/26/20 0300  BP:    135/75  Pulse: 82 82 84 85  Resp: 19 (!) 26 17 (!) 23  Temp:      TempSrc:      SpO2: 96%  97% 94% 96%   No intake or output data in the 24 hours ending 02/26/20 0442 Last 3 Weights 02/24/2020 02/23/2020 02/22/2020  Weight (lbs) 138 lb 3.7 oz 141 lb 3.2 oz 141 lb  15.6 oz  Weight (kg) 62.7 kg 64.048 kg 64.4 kg     There is no height or weight on file to calculate BMI.  General:  Well nourished, well developed, in no acute distress HEENT: Mild swelling over right lower forehead/orbit Neck: no JVD Vascular: No carotid bruits; FA pulses 2+ bilaterally without bruits  Cardiac: Tachycardic, regular rhythm, no murmurs, rubs or gallops Lungs:  clear to auscultation bilaterally, no wheezing, rhonchi or rales  Abd: soft, nontender, no hepatomegaly  Ext: no edema Musculoskeletal:  No deformities, BUE and BLE strength normal and equal Skin: warm and dry  Neuro:  CNs 2-12 intact, no focal abnormalities noted Psych:  Normal affect   EKG:  The EKG was personally reviewed and demonstrates:  Sinus rhythm, no significant ST segment changes   Relevant CV Studies: Echo 02/17/20 1. Left ventricular ejection fraction, by estimation, is 65 to 70%. The  left ventricle has normal function. The left ventricle has no regional  wall motion abnormalities. Left ventricular diastolic parameters are  consistent with Grade I diastolic  dysfunction (impaired relaxation).  2. Right ventricular systolic function is normal. The right ventricular  size is normal.  3. The mitral valve is normal in structure. Mild mitral valve  regurgitation. No evidence of mitral stenosis.  4. The aortic valve is normal in structure. Aortic valve regurgitation is  not visualized. Mild to moderate aortic valve sclerosis/calcification is  present, without any evidence of aortic stenosis.  5. The inferior vena cava is normal in size with greater than 50%  respiratory variability, suggesting right atrial pressure of 3 mmHg.   LHC 02/17/20  1st Mrg lesion is 60% stenosed.  Prox LAD to Mid LAD lesion is 90% stenosed.  Mid LAD-1 lesion is 80% stenosed.  Mid LAD-2 lesion is 50% stenosed.  1st Diag-1 lesion is 95% stenosed.  1st Diag-2 lesion is 70% stenosed.  Ost RCA to Prox RCA lesion  is 70% stenosed.  Mid RCA lesion is 70% stenosed.  RPDA lesion is 50% stenosed.  The left ventricular ejection fraction is greater than 65% by visual estimate.  LV end diastolic pressure is normal.  The left ventricular systolic function is normal.  There is no mitral valve regurgitation.   Laboratory Data:  High Sensitivity Troponin:   Recent Labs  Lab 02/07/20 1134 02/07/20 1405 02/26/20 0007  TROPONINIHS 3 3 4,550*     Chemistry Recent Labs  Lab 02/23/20 0623 02/24/20 0420 02/26/20 0007  NA 139 136 138  K 4.5 4.1 4.0  CL 105 102 95*  CO2 17* 23 29  GLUCOSE 119* 188* 144*  BUN 18 20 23*  CREATININE 1.05* 1.10* 1.16*  CALCIUM 8.6* 8.4* 9.3  GFRNONAA 59* 56* 52*  GFRAA >60 >60 >60  ANIONGAP 17* 11 14    Recent Labs  Lab 02/20/20 0737  PROT 6.3*  ALBUMIN 3.3*  AST 19  ALT 14  ALKPHOS 57  BILITOT 0.7   Hematology Recent Labs  Lab 02/23/20 0623 02/24/20 0420 02/26/20 0007  WBC 9.8 8.2 9.5  RBC 3.99 3.92 4.63  HGB 11.7* 11.4* 13.4  HCT 37.2 35.1* 41.2  MCV 93.2 89.5 89.0  MCH 29.3 29.1 28.9  MCHC 31.5 32.5 32.5  RDW 14.3 13.7 13.3  PLT 149* 124* 247  BNP Recent Labs  Lab 02/26/20 0008  BNP 598.5*    DDimer No results for input(s): DDIMER in the last 168 hours.   Radiology/Studies:  DG Wrist Complete Left  Result Date: 02/25/2020 CLINICAL DATA:  Fall EXAM: LEFT WRIST - COMPLETE 3+ VIEW COMPARISON:  None. FINDINGS: There is a slightly impacted nondisplaced distal radius fracture seen. A mildly displaced ulnar styloid fracture is noted. Diffuse overlying soft tissue swelling is seen. There is diffuse osteopenia noted. IMPRESSION: Slightly impacted nondisplaced distal radius fracture. Mildly displaced ulnar styloid fracture. Electronically Signed   By: Jonna Clark M.D.   On: 02/25/2020 23:40   CT HEAD WO CONTRAST  Result Date: 02/26/2020 CLINICAL DATA:  Headache, posttraumatic EXAM: CT HEAD WITHOUT CONTRAST TECHNIQUE: Contiguous axial  images were obtained from the base of the skull through the vertex without intravenous contrast. COMPARISON:  None. FINDINGS: Brain: No evidence of acute territorial infarction, hemorrhage, hydrocephalus,extra-axial collection or mass lesion/mass effect. Normal gray-white differentiation. Ventricles are normal in size and contour. Vascular: No hyperdense vessel or unexpected calcification. Skull: The skull is intact. There is a mildly displaced obliquely oriented fracture seen through the lateral orbital wall. There is a comminuted mildly impacted fracture seen through the lateral maxillary wall and inferior orbital floor extends through the infraorbital foramen. No displacement of the inferior orbital fat. There is small foci of air seen within the inferior orbit soft tissues. Sinuses/Orbits: The visualized paranasal sinuses and mastoid air cells are clear. The orbits and globes intact. Other: Right periorbital soft tissue swelling is seen. There is also soft tissue swelling seen over the right frontal skull. The orbits however appear to be intact. No retro-orbital fluid collections are noted. IMPRESSION: No acute intracranial abnormality. Nondisplaced comminuted fractures through the lateral orbital wall, lateral maxillary wall common inferior orbital floor. No displacement of the inferior orbital fat. Soft tissue swelling seen over the right frontal skull and right periorbital soft tissues. Electronically Signed   By: Jonna Clark M.D.   On: 02/26/2020 01:03   DG Chest Port 1 View  Result Date: 02/25/2020 CLINICAL DATA:  Larey Seat, syncope, recent CABG EXAM: PORTABLE CHEST 1 VIEW COMPARISON:  02/23/2020 FINDINGS: Single frontal view of the chest demonstrates stable postsurgical changes from bypass surgery. The cardiac silhouette is stable. Improved volume status, with resolution of airspace disease and effusions seen previously. Minimal scarring or atelectasis at the left base. No pneumothorax. No acute bony  abnormalities. IMPRESSION: 1. Minimal left basilar atelectasis. 2. No acute intrathoracic process. Electronically Signed   By: Sharlet [name] M.D.   On: 02/25/2020 23:41   DG Chest Port 1 View  Result Date: 02/23/2020 CLINICAL DATA:  Bypass surgery. EXAM: PORTABLE CHEST 1 VIEW COMPARISON:  02/22/2020 FINDINGS: The right IJ catheter is stable. The left chest tube and mediastinal drain tubes are stable. No pneumothorax. Persistent bibasilar atelectasis but no pleural effusions or pulmonary edema. IMPRESSION: 1. Stable support apparatus. 2. Bibasilar atelectasis. Electronically Signed   By: Rudie Meyer M.D.   On: 02/23/2020 09:04   DG Chest Port 1 View  Result Date: 02/22/2020 CLINICAL DATA:  Status post CABG. Chest tubes in place. EXAM: PORTABLE CHEST 1 VIEW COMPARISON:  02/21/2020 FINDINGS: The endotracheal tube and NG tube have been removed. Chest tubes and central venous catheter remain in place, unchanged. No pneumothorax. Slight haziness at both lung bases probably represents tiny effusions. CABG. The cardiac silhouette and pulmonary vascularity are normal. No bone abnormality. IMPRESSION: Slight bibasilar  effusions. No pneumothorax. Electronically Signed  By: Francene Boyers M.D.   On: 02/22/2020 09:25    TIMI Risk Score for Unstable Angina or Non-ST Elevation MI:   The patient's TIMI risk score is 4, which indicates a 20% risk of all cause mortality, new or recurrent myocardial infarction or need for urgent revascularization in the next 14 days.   Assessment and Plan:   TARREN SABREE is a 59 y.o. female with a hx of type 1 DM, HTN, HLD, and CAD s/p 5v CABG on 02/21/20 who is being seen today for the evaluation of syncope and elevated troponin. Her syncope is certainly concerning in the setting of recent CABG surgery and no prodrome, particularly with her very high hsTnT. I am most concerned about an arrhythmia as the cause of her symptoms. It would be very unlikely for her to have graft  failure this soon after bypass surgery, particularly in the absence of chest pain or other symptoms. However, given the significantly elevated troponin and low-risk of heparin, would start heparin for now while we trend troponins.   - Admit to medicine - Monitor on tele - Continue to trend troponins - Heparin drip, ACS nomogram, would avoid bolus given trauma - Continue aspirin 81mg  - Cardiology will continue to follow     For questions or updates, please contact [facility] Please consult [email or web address] for contact info under     Signed, , MD  02/26/2020 4:42 AM

## 2020-02-26 NOTE — ED Notes (Signed)
CBG 143  

## 2020-02-26 NOTE — Progress Notes (Signed)
Orthopedic Tech Progress Note Patient Details:  Felicia [name] 1961-04-15 [id number] Patient was fitted into the sling but did not want to wear it at that moment  Ortho Devices Type of Ortho Device: Arm sling, Sugartong splint Ortho Device/Splint Location: LUE Ortho Device/Splint Interventions: Application   Post Interventions Patient Tolerated: Well Instructions Provided: Care of device, Adjustment of device   Ophia Shamoon E Rishaan Gunner 02/26/2020, 4:58 AM

## 2020-02-26 NOTE — ED Notes (Signed)
Pt wants blood drawn from her pic line. RN Tobi Bastos informed.

## 2020-02-26 NOTE — Progress Notes (Signed)
PROGRESS NOTE    Felicia TABBERT  TDD:[id number] DOB: [birth year] DOA: 02/25/2020 PCP: [name], MD    Brief Narrative:  Patient was admitted to the hospital with a working diagnosis of left distal radius and ulna fractures, nondisplaced orbital and maxillary fractures, complicated by elevated troponin.   59 year old female who presented after mechanical fall and syncope.  She does have significant past medical history for coronary artery disease status post 5 vessel bypass February 21, 2020, she has type 2 diabetes mellitus, hypertension, and dyslipidemia.  She was discharged from the hospital 2 days ago.  Apparently patient was sitting watching TV, then she stood up to go to the bathroom with the intention to move her bowels, while walking she had a syncope episode with no prior prodromal symptoms.  Post trauma she had facial and left wrist pain.  Sodium 138, potassium 4.1, chloride 95, bicarb 29, glucose 144, BUN 23, creatinine 1.16, troponin I 4,550, white count 9.5, hemoglobin 13.4, hematocrit 41.2, platelets 247.  Head CT no acute changes.  EKG 84 bpm, normal axis, normal intervals, sinus rhythm, no ST segment or T wave changes.  Assessment & Plan:   Principal Problem:   Syncope Active Problems:   S/P CABG (coronary artery bypass graft)   CAD (coronary artery disease)   Radius and ulna distal fracture   Orbital fracture (HCC)   1. Syncope, complicated with left radius/ ulna fracture/ left orbital wall, lateral maxillary wall and inferior orbital floor.  Pain is well controlled, now has a case on her left upper extremity.   Possible orthostatic syncope, to rule out arrhythmia. Will continue telemetry monitor and will consult physical and occupational therapy.   2. CAD sp CABG with elevated troponin. EKG with no ischemic changes, echocardiogram from April 15 with preserved LV systolic function 65 to 70%. No clinical signs of heart failure. Troponin I 3,850, 2.678, 2,865, have  remain stable.   Doubt acute coronary syndrome. Will continue close telemetry monitoring. Patient has declined heparin. Continue to trend troponin.   Continue asa, b blockade, statin and ace inh. Follow with limited echocardiogram.   3. Uncontrolled T1DM/ dyslipidemia. Continue glucose control with insulin sliding scale and basal insulin therapy. Continue with statin    4. HTN. Continue blood pressure control with metoprolol and lisinopril.     DVT prophylaxis: Heparin   Code Status:   full  Family Communication:  I spoke with patient's husband at the bedside, we talked in detail about patient's condition, plan of care and prognosis and all questions were addressed.   Disposition Plan:   Patient is from:  Home   Anticipated DC to:  Home   Anticipated DC date:  To be determined   Anticipated DC barriers: Patient with elevated troponin and need for inpatient telemetry monitoring. Will need pt and ot     Consultants:   Cardiology    Subjective: Patient continue to have left facial and left upper extremity pain, worse with movement, no nausea or vomiting, no chest pain or dyspnea,   Objective: Vitals:   02/26/20 1037 02/26/20 1100 02/26/20 1200 02/26/20 1328  BP: 110/64 104/63 108/65 122/66  Pulse:  78 78 81  Resp:  (!) 24 12 (!) 24  Temp:      TempSrc:      SpO2:  95% 94% 93%   No intake or output data in the 24 hours ending 02/26/20 1437 There were no vitals filed for this visit.  Examination:   General: Not  in pain or dyspnea, deconditioned  Neurology: Awake and alert, non focal  E ENT: no pallor, no icterus, oral mucosa moist Cardiovascular: No JVD. S1-S2 present, rhythmic, no gallops, rubs, or murmurs. No lower extremity edema. Pulmonary: positive breath sounds bilaterally, adequate air movement, no wheezing, rhonchi or rales. Gastrointestinal. Abdomen with no organomegaly, non tender, no rebound or guarding Skin. No rashes Musculoskeletal: left upper extremity  in a cast.      Data Reviewed: I have personally reviewed following labs and imaging studies  CBC: Recent Labs  Lab 02/22/20 0319 02/22/20 1551 02/23/20 0623 02/24/20 0420 02/26/20 0007  WBC 11.8* 8.8 9.8 8.2 9.5  HGB 11.7* 11.4* 11.7* 11.4* 13.4  HCT 35.7* 35.8* 37.2 35.1* 41.2  MCV 89.7 91.1 93.2 89.5 89.0  PLT 167 PLATELET CLUMPS NOTED ON SMEAR, UNABLE TO ESTIMATE 149* 124* 247   Basic Metabolic Panel: Recent Labs  Lab 02/21/20 2114 02/21/20 2114 02/22/20 0319 02/22/20 1551 02/23/20 0623 02/24/20 0420 02/26/20 0007  NA 140   < > 140 140 139 136 138  K 4.3   < > 4.5 4.6 4.5 4.1 4.0  CL 109   < > 108 110 105 102 95*  CO2 23   < > 21* 19* 17* 23 29  GLUCOSE 112*   < > 119* 139* 119* 188* 144*  BUN 13   < > 14 17 18 20  23*  CREATININE 0.93   < > 0.93 0.98 1.05* 1.10* 1.16*  CALCIUM 8.3*   < > 8.3* 8.0* 8.6* 8.4* 9.3  MG 3.0*  --  2.7* 2.3  --   --   --    < > = values in this interval not displayed.   GFR: Estimated Creatinine Clearance: 46.2 mL/min (A) (by C-G formula based on SCr of 1.16 mg/dL (H)). Liver Function Tests: Recent Labs  Lab 02/20/20 0737  AST 19  ALT 14  ALKPHOS 57  BILITOT 0.7  PROT 6.3*  ALBUMIN 3.3*   No results for input(s): LIPASE, AMYLASE in the last 168 hours. No results for input(s): AMMONIA in the last 168 hours. Coagulation Profile: Recent Labs  Lab 02/20/20 0737 02/21/20 1424  INR 0.9 1.5*   Cardiac Enzymes: No results for input(s): CKTOTAL, CKMB, CKMBINDEX, TROPONINI in the last 168 hours. BNP (last 3 results) No results for input(s): PROBNP in the last 8760 hours. HbA1C: No results for input(s): HGBA1C in the last 72 hours. CBG: Recent Labs  Lab 02/24/20 1154 02/26/20 0021 02/26/20 0414 02/26/20 0828 02/26/20 1212  GLUCAP 153* 127* 124* 147* 161*   Lipid Profile: No results for input(s): CHOL, HDL, LDLCALC, TRIG, CHOLHDL, LDLDIRECT in the last 72 hours. Thyroid Function Tests: No results for input(s): TSH,  T4TOTAL, FREET4, T3FREE, THYROIDAB in the last 72 hours. Anemia Panel: No results for input(s): VITAMINB12, FOLATE, FERRITIN, TIBC, IRON, RETICCTPCT in the last 72 hours.    Radiology Studies: I have reviewed all of the imaging during this hospital visit personally     Scheduled Meds: . aspirin  81 mg Oral Daily  . ezetimibe  10 mg Oral Daily  . insulin aspart  0-15 Units Subcutaneous TID WC  . insulin glargine  10 Units Subcutaneous Daily   And  . insulin glargine  8 Units Subcutaneous QHS  . lisinopril  5 mg Oral Daily  . metoprolol tartrate  12.5 mg Oral BID  . multivitamin with minerals  1 tablet Oral Daily  . pravastatin  60 mg Oral Daily  .  sodium chloride flush  10-40 mL Intracatheter Q12H   Continuous Infusions: . heparin Stopped (02/26/20 1740)     LOS: 0 days        Lelia Jons Gerome Apley, MD

## 2020-02-26 NOTE — ED Notes (Signed)
IV team arrives.  Pt prefers straight stick for labs and no IV at this time.

## 2020-02-26 NOTE — ED Notes (Signed)
Pt does not want Heparin IV started now because blood can not be drawn from IV site blood orders.

## 2020-02-26 NOTE — Plan of Care (Signed)

## 2020-02-26 NOTE — Progress Notes (Signed)
Reviewed events leading up to her syncope and fall. Felt the imperious need to move her bowels before standing up rapidly. Listening to all the details, she actually had a fair amount of prodromal dizziness and a "weird feeling" and had time to tell her husband that something was not right and that she needs to sit down.  After her fall she recovered almost instantaneously.  When they tried to have her sit back up, she again felt dizzy and the room was spinning.  After they laid her back down again, it immediately improved her symptoms. She has been receiving beta-blockers and ACE inhibitors, metoprolol is a new medication for her since bypass surgery.  Her current blood pressure is 95/60 mmHg.  She is no longer receiving diuretics, but did take them for a couple of days after her hospital discharge. No arrhythmia has been seen on telemetry during this current hospitalization. The history is most consistent with neurally mediated syncope, but cannot entirely exclude arrhythmia. Cardiac enzymes have been trending downwards ever since her arrival, not consistent with a recent infarction or contusion, possibly "leftover" since her bypass procedure on April 19. We will review her echocardiogram and stop the beta-blocker.  If no new wall motion abnormalities are seen, plan to discharge with a 2-week arrhythmia monitor, preferably on with live transmission.  Thurmon Fair, MD, [facility] [phone] office [phone] pager

## 2020-02-26 NOTE — Progress Notes (Signed)
ANTICOAGULATION CONSULT NOTE - Initial Consult  Pharmacy Consult for Heparin  Indication: Elevated troponin, recent CABG  Allergies  Allergen Reactions  . Protonix [Pantoprazole] Swelling    Facial Swelling  . Toujeo Solostar [Insulin Glargine] Shortness Of Breath  . Lactose Intolerance (Gi)     GI  . Magnesium-Containing Compounds Swelling and Other (See Comments)    limbs  . Adhesive [Tape] Rash and Other (See Comments)    Band aids    Vital Signs: Temp: 98.5 F (36.9 C) (04/23 2251) Temp Source: Oral (04/23 2251) BP: 135/75 (04/24 0300) Pulse Rate: 85 (04/24 0300)  Labs: Recent Labs    02/23/20 0623 02/23/20 0623 02/24/20 0420 02/26/20 0007  HGB 11.7*   < > 11.4* 13.4  HCT 37.2  --  35.1* 41.2  PLT 149*  --  124* 247  CREATININE 1.05*  --  1.10* 1.16*  TROPONINIHS  --   --   --  4,550*   < > = values in this interval not displayed.    Estimated Creatinine Clearance: 46.2 mL/min (A) (by C-G formula based on SCr of 1.16 mg/dL (H)).   Medical History: Past Medical History:  Diagnosis Date  . Allergy   . Anemia    past history long ago  . Borderline HTN (hypertension)   . CAD (coronary artery disease)    a. 02/2020 Cath: LM nl, LAD large, 90p, 46m, D1 90p, LCX nl, OM 60-70p, RCA dominant w/ dampening upon engagement of ostium. 70p/m, RPDA 50. EF 55-65%.  . Disorder of kidney    has abd kidney (right)  . GERD (gastroesophageal reflux disease)   . History of diabetic gastroparesis   . History of echocardiogram    a. 01/2013 Echo: EF 55-65%, no rwma. Mild MR. Nl RV fxn.  Marland Kitchen HLD (hyperlipidemia)   . Hx: UTI (urinary tract infection)   . IBS (irritable bowel syndrome)   . Nephrolithiasis 2005  . Osteopenia 2012   per pt  . Systolic murmur 01/2013   mild mitral insuff  . Type 1 diabetes mellitus with diabetic retinopathy without macular edema (HCC)    dx at 59 y/o, background retinopathy    Assessment: 59 y/o F with recent CABG (4/19) presents back to the  ED with syncopal episode. Troponin is elevated, which could be post-surgical. Start heparin while awaiting cardiology work-up. CBC good. Renal function ok. PTA meds reviewed.   Goal of Therapy:  Heparin level 0.3-0.7 units/ml Monitor platelets by anticoagulation protocol: Yes   Plan:  Will defer bolus with CABG 5 days ago Start heparin drip at 800 units/hr Check heparin level at 1300 Daily CBC/HL Monitor for bleeding  Abran Duke, PharmD, BCPS Clinical Pharmacist Phone: [phone]

## 2020-02-26 NOTE — H&P (Signed)
History and Physical    Felicia [name] PJK:[id number] DOB: Apr 27, 1961 DOA: 02/25/2020  PCP: [name], MD Patient coming from: Home  Chief Complaint: Syncope and fall  HPI: Felicia [name] is a 59 y.o. female with medical history significant of CAD status post 5 vessel CABG on 02/21/2020, type 1 diabetes, hypertension, hyperlipidemia presenting via EMS for evaluation of syncope and fall. Patient states she had some mild fatigue but overall feeling quite well after her recent hospital discharge 2 days ago.  Last night all of a sudden while sitting and watching television she felt like she had to have a bowel movement.  She then stood up to walk to the bathroom.  While walking all of a sudden she blacked out.  No preceding lightheadedness/dizziness, chest pain, or shortness of breath.  Upon waking up she was having facial and left wrist pain.  Her husband told her that she had fallen and hit the ground.  When EMS arrived and tried to sit her up she felt the room spinning around her.  After she was wheeled into the ambulance she felt better.  ED Course: Orthostatics checked by EMS negative.  Hemodynamically stable in the ED.  High-sensitivity troponin 4550 >3850.  EKG without acute ischemic changes.  BNP 598.  SARS-CoV-2 PCR test negative.  Chest x-ray showing minimal left basilar atelectasis and no acute intrathoracic process.  Head CT showing no acute intracranial abnormality.  Nondisplaced comminuted fractures throughout the lateral orbital wall, lateral maxillary wall, and inferior orbital floor.  No displacement of the inferior orbital fat.  Soft tissue swelling seen over the right frontal skull and right periorbital soft tissues.  X-ray of left wrist showing slightly impacted nondisplaced distal radius fracture.  Mildly displaced ulnar styloid fracture.  Cardiology and orthopedics consulted.  Review of Systems:  All systems reviewed and apart from history of presenting illness, are  negative.  Past Medical History:  Diagnosis Date   Allergy    Anemia    past history long ago   Borderline HTN (hypertension)    CAD (coronary artery disease)    a. 02/2020 Cath: LM nl, LAD large, 90p, 3m, D1 90p, LCX nl, OM 60-70p, RCA dominant w/ dampening upon engagement of ostium. 70p/m, RPDA 50. EF 55-65%.   Disorder of kidney    has abd kidney (right)   GERD (gastroesophageal reflux disease)    History of diabetic gastroparesis    History of echocardiogram    a. 01/2013 Echo: EF 55-65%, no rwma. Mild MR. Nl RV fxn.   HLD (hyperlipidemia)    Hx: UTI (urinary tract infection)    IBS (irritable bowel syndrome)    Nephrolithiasis 2005   Osteopenia 2012   per pt   Systolic murmur 01/2013   mild mitral insuff   Type 1 diabetes mellitus with diabetic retinopathy without macular edema (HCC)    dx at 59 y/o, background retinopathy    Past Surgical History:  Procedure Laterality Date   CESAREAN SECTION  1985   COLONOSCOPY  07/2016   WNL (Pyrtle)   CORONARY ARTERY BYPASS GRAFT N/A 02/21/2020   Procedure: CORONARY ARTERY BYPASS GRAFTING (CABG) times five using left internal mammary and right leg saphenous vein;  Surgeon: Corliss Skains, MD;  Location: [facility] OR;  Service: Open Heart Surgery;  Laterality: N/A;   ESOPHAGOGASTRODUODENOSCOPY  07/2016   LA Grade C reflux esophagitis, concern for stasis/dysmotility (Pyrtle)   LAPAROSCOPIC APPENDECTOMY N/A 09/01/2015   Procedure: APPENDECTOMY LAPAROSCOPIC;  Surgeon: Jimmye Norman,  MD;  Location: [facility] OR;  Service: General;  Laterality: N/A;   LEFT HEART CATH AND CORONARY ANGIOGRAPHY N/A 02/17/2020   Procedure: LEFT HEART CATH AND CORONARY ANGIOGRAPHY;  Surgeon: Antonieta Iba, MD;  Location: [facility] INVASIVE CV LAB;  Service: Cardiovascular;  Laterality: N/A;   ORIF FEMUR FRACTURE Left 1996   Corrected by patient in history 2016   PARTIAL HYSTERECTOMY  2000   paps by [facility] [place]   TEE WITHOUT CARDIOVERSION  N/A 02/21/2020   Procedure: TRANSESOPHAGEAL ECHOCARDIOGRAM (TEE);  Surgeon: Corliss Skains, MD;  Location: [facility] OR;  Service: Open Heart Surgery;  Laterality: N/A;   TONSILLECTOMY AND ADENOIDECTOMY  1975   TRIGGER FINGER RELEASE Left 12/2010   thumb     reports that she has never smoked. She has never used smokeless tobacco. She reports that she does not drink alcohol or use drugs.  Allergies  Allergen Reactions   Protonix [Pantoprazole] Swelling    Facial Swelling   Toujeo Solostar [Insulin Glargine] Shortness Of Breath   Lactose Intolerance (Gi)     GI   Magnesium-Containing Compounds Swelling and Other (See Comments)    limbs   Adhesive [Tape] Rash and Other (See Comments)    Band aids    Family History  Problem Relation Age of Onset   Diabetes Maternal Grandmother    Heart failure Maternal Grandmother    COPD Maternal Grandmother        smoker   Thyroid disease Maternal Grandmother    Vascular Disease Maternal Grandmother    Colon cancer Maternal Grandfather    Colon polyps Mother    Breast cancer Other    CAD Brother 25       stent   Esophageal cancer Neg Hx    Rectal cancer Neg Hx    Stomach cancer Neg Hx     Prior to Admission medications   Medication Sig Start Date End Date Taking? Authorizing Provider  acetaminophen (TYLENOL) 325 MG tablet Take 2 tablets (650 mg total) by mouth every 6 (six) hours as needed for mild pain. 02/24/20  Yes Leary Roca, PA-C  aspirin EC 325 MG EC tablet Take 1 tablet (325 mg total) by mouth daily. 02/25/20  Yes Roddenberry, Cecille Amsterdam, PA-C  esomeprazole (NEXIUM) 40 MG capsule Take 1 capsule (40 mg total) by mouth daily. Patient needs office visit for further refills 04/26/19  Yes Pyrtle, Carie Caddy, MD  ezetimibe (ZETIA) 10 MG tablet Take 1 tablet (10 mg total) by mouth daily. 02/25/20  Yes Roddenberry, Cecille Amsterdam, PA-C  furosemide (LASIX) 40 MG tablet Take 1 tablet (40 mg total) by mouth daily for 2 days. 02/25/20  02/27/20 Yes Roddenberry, Cecille Amsterdam, PA-C  glucagon (GLUCAGON EMERGENCY) 1 MG injection Inject 1 mg into the muscle once as needed (in case of severe hypoglycemia). 09/04/18  Yes Carlus Pavlov, MD  Insulin Glargine (LANTUS SOLOSTAR) 100 UNIT/ML Solostar Pen Inject 12 Units into the skin daily. Patient taking differently: Inject 8-10 Units into the skin See admin instructions. Inject 10 units into the skin in the morning and 8 units in the evening 10/25/19  Yes Carlus Pavlov, MD  insulin regular (NOVOLIN R) 100 units/mL injection INJECT 6 UNITS TOTAL INTO THE SKIN 3 (THREE) TIMES DAILY BEFORE MEALS. Patient taking differently: Inject 1-10 Units into the skin See admin instructions. For glucose 150-200 add 1 additional unit, adding 1 unit with increasing BG by 60 points.   For breakfast and dinner, administer 1 unit for every  10 grams of carbs. For snacks and dinner, administer 1 unit for every 20 grams of carbs. 08/06/19  Yes Philemon Kingdom, MD  lisinopril (ZESTRIL) 5 MG tablet Take 1 tablet (5 mg total) by mouth daily. 02/25/20  Yes Roddenberry, Arlis Porta, PA-C  metoprolol tartrate (LOPRESSOR) 25 MG tablet Take 0.5 tablets (12.5 mg total) by mouth 2 (two) times daily. 02/24/20  Yes Roddenberry, Arlis Porta, PA-C  Multiple Vitamin (MULTIVITAMIN) tablet Take 1 tablet by mouth daily.     Yes [provider]  potassium chloride SA (KLOR-CON) 20 MEQ tablet Take 1 tablet (20 mEq total) by mouth daily for 2 days. 02/25/20 02/27/20 Yes Roddenberry, Myron G, PA-C  pravastatin (PRAVACHOL) 40 MG tablet TAKE 1.5 TABLETS (60 MG TOTAL) BY MOUTH DAILY. Patient taking differently: Take 60 mg by mouth daily.  02/24/20  Yes Ria Bush, MD  traMADol (ULTRAM) 50 MG tablet Take 1 tablet (50 mg total) by mouth every 4 (four) hours as needed for up to 7 days for moderate pain. 02/24/20 03/02/20 Yes Roddenberry, Arlis Porta, PA-C  Insulin Pen Needle (NOVOFINE) 32G X 6 MM MISC USE TO INJECT INSULIN 4 TIMES A DAY 05/24/19    Philemon Kingdom, MD  Insulin Syringe-Needle U-100 30G X 5/16" 0.3 ML MISC Use to inject insulin 6 times a day. 01/07/20   Philemon Kingdom, MD    Physical Exam: Vitals:   02/26/20 0215 02/26/20 0230 02/26/20 0245 02/26/20 0300  BP:    135/75  Pulse: 82 82 84 85  Resp: 19 (!) 26 17 (!) 23  Temp:      TempSrc:      SpO2: 96% 97% 94% 96%    Physical Exam  Constitutional: She is oriented to person, place, and time. She appears well-developed and well-nourished. No distress.  HENT:  Head: Normocephalic.  Eyes: EOM are normal. Right eye exhibits no discharge. Left eye exhibits no discharge.  Cardiovascular: Normal rate, regular rhythm and intact distal pulses.  Pulmonary/Chest: Effort normal and breath sounds normal. No respiratory distress. She has no wheezes. She has no rales.  Abdominal: Soft. Bowel sounds are normal. She exhibits no distension. There is no abdominal tenderness. There is no guarding.  Musculoskeletal:        General: No edema.     Cervical back: Neck supple.     Comments: Left wrist splint in place  Neurological: She is alert and oriented to person, place, and time.  Skin: Skin is warm and dry. She is not diaphoretic.     Labs on Admission: I have personally reviewed following labs and imaging studies  CBC: Recent Labs  Lab 02/22/20 0319 02/22/20 1551 02/23/20 0623 02/24/20 0420 02/26/20 0007  WBC 11.8* 8.8 9.8 8.2 9.5  HGB 11.7* 11.4* 11.7* 11.4* 13.4  HCT 35.7* 35.8* 37.2 35.1* 41.2  MCV 89.7 91.1 93.2 89.5 89.0  PLT 167 PLATELET CLUMPS NOTED ON SMEAR, UNABLE TO ESTIMATE 149* 124* 151   Basic Metabolic Panel: Recent Labs  Lab 02/21/20 2114 02/21/20 2114 02/22/20 0319 02/22/20 1551 02/23/20 0623 02/24/20 0420 02/26/20 0007  NA 140   < > 140 140 139 136 138  K 4.3   < > 4.5 4.6 4.5 4.1 4.0  CL 109   < > 108 110 105 102 95*  CO2 23   < > 21* 19* 17* 23 29  GLUCOSE 112*   < > 119* 139* 119* 188* 144*  BUN 13   < > 14 17 18 20  23*  CREATININE 0.93   < > 0.93 0.98 1.05* 1.10* 1.16*  CALCIUM 8.3*   < > 8.3* 8.0* 8.6* 8.4* 9.3  MG 3.0*  --  2.7* 2.3  --   --   --    < > = values in this interval not displayed.   GFR: Estimated Creatinine Clearance: 46.2 mL/min (A) (by C-G formula based on SCr of 1.16 mg/dL (H)). Liver Function Tests: Recent Labs  Lab 02/20/20 0737  AST 19  ALT 14  ALKPHOS 57  BILITOT 0.7  PROT 6.3*  ALBUMIN 3.3*   No results for input(s): LIPASE, AMYLASE in the last 168 hours. No results for input(s): AMMONIA in the last 168 hours. Coagulation Profile: Recent Labs  Lab 02/20/20 0737 02/21/20 1424  INR 0.9 1.5*   Cardiac Enzymes: No results for input(s): CKTOTAL, CKMB, CKMBINDEX, TROPONINI in the last 168 hours. BNP (last 3 results) No results for input(s): PROBNP in the last 8760 hours. HbA1C: No results for input(s): HGBA1C in the last 72 hours. CBG: Recent Labs  Lab 02/24/20 0027 02/24/20 0607 02/24/20 1154 02/26/20 0021 02/26/20 0414  GLUCAP 162* 160* 153* 127* 124*   Lipid Profile: No results for input(s): CHOL, HDL, LDLCALC, TRIG, CHOLHDL, LDLDIRECT in the last 72 hours. Thyroid Function Tests: No results for input(s): TSH, T4TOTAL, FREET4, T3FREE, THYROIDAB in the last 72 hours. Anemia Panel: No results for input(s): VITAMINB12, FOLATE, FERRITIN, TIBC, IRON, RETICCTPCT in the last 72 hours. Urine analysis:    Component Value Date/Time   COLORURINE YELLOW 02/19/2020 2045   APPEARANCEUR CLEAR 02/19/2020 2045   LABSPEC 1.016 02/19/2020 2045   PHURINE 5.0 02/19/2020 2045   GLUCOSEU NEGATIVE 02/19/2020 2045   HGBUR NEGATIVE 02/19/2020 2045   BILIRUBINUR NEGATIVE 02/19/2020 2045   BILIRUBINUR Negative 07/25/2011 0818   KETONESUR NEGATIVE 02/19/2020 2045   PROTEINUR NEGATIVE 02/19/2020 2045   UROBILINOGEN 1.0 09/01/2015 1937   NITRITE NEGATIVE 02/19/2020 2045   LEUKOCYTESUR NEGATIVE 02/19/2020 2045    Radiological Exams on Admission: DG Wrist Complete  Left  Result Date: 02/25/2020 CLINICAL DATA:  Fall EXAM: LEFT WRIST - COMPLETE 3+ VIEW COMPARISON:  None. FINDINGS: There is a slightly impacted nondisplaced distal radius fracture seen. A mildly displaced ulnar styloid fracture is noted. Diffuse overlying soft tissue swelling is seen. There is diffuse osteopenia noted. IMPRESSION: Slightly impacted nondisplaced distal radius fracture. Mildly displaced ulnar styloid fracture. Electronically Signed   By: Jonna Clark M.D.   On: 02/25/2020 23:40   CT HEAD WO CONTRAST  Result Date: 02/26/2020 CLINICAL DATA:  Headache, posttraumatic EXAM: CT HEAD WITHOUT CONTRAST TECHNIQUE: Contiguous axial images were obtained from the base of the skull through the vertex without intravenous contrast. COMPARISON:  None. FINDINGS: Brain: No evidence of acute territorial infarction, hemorrhage, hydrocephalus,extra-axial collection or mass lesion/mass effect. Normal gray-white differentiation. Ventricles are normal in size and contour. Vascular: No hyperdense vessel or unexpected calcification. Skull: The skull is intact. There is a mildly displaced obliquely oriented fracture seen through the lateral orbital wall. There is a comminuted mildly impacted fracture seen through the lateral maxillary wall and inferior orbital floor extends through the infraorbital foramen. No displacement of the inferior orbital fat. There is small foci of air seen within the inferior orbit soft tissues. Sinuses/Orbits: The visualized paranasal sinuses and mastoid air cells are clear. The orbits and globes intact. Other: Right periorbital soft tissue swelling is seen. There is also soft tissue swelling seen over the right frontal skull. The orbits however appear to be  intact. No retro-orbital fluid collections are noted. IMPRESSION: No acute intracranial abnormality. Nondisplaced comminuted fractures through the lateral orbital wall, lateral maxillary wall common inferior orbital floor. No displacement  of the inferior orbital fat. Soft tissue swelling seen over the right frontal skull and right periorbital soft tissues. Electronically Signed   By: Jonna ClarkBindu  Avutu M.D.   On: 02/26/2020 01:03   DG Chest Port 1 View  Result Date: 02/25/2020 CLINICAL DATA:  Larey SeatFell, syncope, recent CABG EXAM: PORTABLE CHEST 1 VIEW COMPARISON:  02/23/2020 FINDINGS: Single frontal view of the chest demonstrates stable postsurgical changes from bypass surgery. The cardiac silhouette is stable. Improved volume status, with resolution of airspace disease and effusions seen previously. Minimal scarring or atelectasis at the left base. No pneumothorax. No acute bony abnormalities. IMPRESSION: 1. Minimal left basilar atelectasis. 2. No acute intrathoracic process. Electronically Signed   By: Sharlet SalinaMichael  Brown M.D.   On: 02/25/2020 23:41    EKG: Independently reviewed.  Sinus rhythm, no acute ischemic changes.  Assessment/Plan Principal Problem:   Syncope Active Problems:   S/P CABG (coronary artery bypass graft)   CAD (coronary artery disease)   Radius and ulna distal fracture   Orbital fracture (HCC)   Syncope and significantly elevated high-sensitivity troponin in the setting of recent CAD status post 5 vessel CABG on 02/21/2020: Patient has been seen by cardiology and there is concern for arrhythmia as a cause of her symptoms.  Graft failure felt to be very unlikely this soon after her bypass surgery.  Patient has no chest pain or other symptoms.  Given significantly elevated troponin low risk of heparin, cardiology recommending starting IV heparin for now and trending troponin. -Continue cardiac monitoring and admit to progressive care unit.  IV heparin started.  Trend troponin.  Continue aspirin 81 mg daily.  Continue metoprolol.  Left distal radius and ulna fractures: Orthopedics recommending sugar tong splint which has been placed.  Also recommending outpatient follow-up in 1 week.  Nondisplaced orbital/ maxillary  fractures: Extraocular movements intact.  Please consult ENT in a.m.  Uncontrolled type 1 diabetes: A1c 8.9 on 4/15.  Continue home Lantus.  Order sliding scale insulin with meals.  Hypertension: Continue home metoprolol and lisinopril  Hyperlipidemia: Continue home Zetia and pravastatin  DVT prophylaxis: Heparin Code Status: Full code Family Communication: No family available at this time. Disposition Plan: Anticipate discharge after clinical improvement. Consults called: Cardiology, orthopedics Admission status: It is my clinical opinion that admission to INPATIENT is reasonable and necessary because of the expectation that this patient will require hospital care that crosses at least 2 midnights to treat this condition based on the medical complexity of the problems presented.  Given the aforementioned information, the predictability of an adverse outcome is felt to be significant.  The medical decision making on this patient was of high complexity and the patient is at high risk for clinical deterioration, therefore this is a level 3 visit.  John GiovanniVasundhra Deborra Phegley MD [facility]  If 7PM-7AM, please contact night-coverage [email or web address]  02/26/2020, 6:14 AM

## 2020-02-26 NOTE — ED Notes (Signed)
Date and time results received: 02/26/20 @ 0112  Test: Troponin Critical Value: 4,550  Name of Provider Notified: 0112  Orders Received? Or Actions Taken?: No orders given

## 2020-02-27 ENCOUNTER — Other Ambulatory Visit: Payer: Self-pay | Admitting: Nurse Practitioner

## 2020-02-27 ENCOUNTER — Inpatient Hospital Stay (HOSPITAL_COMMUNITY): Payer: BC Managed Care – PPO

## 2020-02-27 DIAGNOSIS — Z951 Presence of aortocoronary bypass graft: Secondary | ICD-10-CM | POA: Diagnosis not present

## 2020-02-27 DIAGNOSIS — I2510 Atherosclerotic heart disease of native coronary artery without angina pectoris: Secondary | ICD-10-CM | POA: Diagnosis not present

## 2020-02-27 DIAGNOSIS — S0285XA Fracture of orbit, unspecified, initial encounter for closed fracture: Secondary | ICD-10-CM | POA: Diagnosis not present

## 2020-02-27 DIAGNOSIS — R55 Syncope and collapse: Secondary | ICD-10-CM | POA: Diagnosis not present

## 2020-02-27 DIAGNOSIS — I25110 Atherosclerotic heart disease of native coronary artery with unstable angina pectoris: Secondary | ICD-10-CM | POA: Diagnosis not present

## 2020-02-27 DIAGNOSIS — I25810 Atherosclerosis of coronary artery bypass graft(s) without angina pectoris: Secondary | ICD-10-CM | POA: Diagnosis not present

## 2020-02-27 DIAGNOSIS — I214 Non-ST elevation (NSTEMI) myocardial infarction: Secondary | ICD-10-CM | POA: Diagnosis not present

## 2020-02-27 DIAGNOSIS — S52502A Unspecified fracture of the lower end of left radius, initial encounter for closed fracture: Secondary | ICD-10-CM | POA: Diagnosis not present

## 2020-02-27 LAB — BASIC METABOLIC PANEL
Anion gap: 9 (ref 5–15)
BUN: 22 mg/dL — ABNORMAL HIGH (ref 6–20)
CO2: 31 mmol/L (ref 22–32)
Calcium: 8.5 mg/dL — ABNORMAL LOW (ref 8.9–10.3)
Chloride: 98 mmol/L (ref 98–111)
Creatinine, Ser: 1.1 mg/dL — ABNORMAL HIGH (ref 0.44–1.00)
GFR calc Af Amer: >60 mL/min (ref >60)
GFR calc non Af Amer: 56 mL/min — ABNORMAL LOW (ref >60)
Glucose, Bld: 192 mg/dL — ABNORMAL HIGH (ref 70–99)
Potassium: 4.1 mmol/L (ref 3.5–5.1)
Sodium: 138 mmol/L (ref 135–145)

## 2020-02-27 LAB — CBC WITH DIFFERENTIAL/PLATELET
Abs Immature Granulocytes: 0.06 10*3/uL (ref 0.00–0.07)
Basophils Absolute: 0.1 10*3/uL (ref 0.0–0.1)
Basophils Relative: 1 %
Eosinophils Absolute: 0.9 10*3/uL — ABNORMAL HIGH (ref 0.0–0.5)
Eosinophils Relative: 10 %
HCT: 41.5 % (ref 36.0–46.0)
Hemoglobin: 13.4 g/dL (ref 12.0–15.0)
Immature Granulocytes: 1 %
Lymphocytes Relative: 25 %
Lymphs Abs: 2.3 10*3/uL (ref 0.7–4.0)
MCH: 29.4 pg (ref 26.0–34.0)
MCHC: 32.3 g/dL (ref 30.0–36.0)
MCV: 91 fL (ref 80.0–100.0)
Monocytes Absolute: 0.9 10*3/uL (ref 0.1–1.0)
Monocytes Relative: 10 %
Neutro Abs: 4.9 10*3/uL (ref 1.7–7.7)
Neutrophils Relative %: 53 %
Platelets: 279 10*3/uL (ref 150–400)
RBC: 4.56 MIL/uL (ref 3.87–5.11)
RDW: 13.4 % (ref 11.5–15.5)
WBC: 9.1 10*3/uL (ref 4.0–10.5)
nRBC: 0 % (ref 0.0–0.2)

## 2020-02-27 LAB — GLUCOSE, CAPILLARY
Glucose-Capillary: 180 mg/dL — ABNORMAL HIGH (ref 70–99)
Glucose-Capillary: 124 mg/dL — ABNORMAL HIGH (ref 70–99)
Glucose-Capillary: 179 mg/dL — ABNORMAL HIGH (ref 70–99)
Glucose-Capillary: 141 mg/dL — ABNORMAL HIGH (ref 70–99)

## 2020-02-27 LAB — ECHOCARDIOGRAM LIMITED
Height: 64 in
Weight: 2183.44 oz

## 2020-02-27 MED ORDER — SODIUM CHLORIDE 0.9 % IV SOLN: INTRAVENOUS | Status: DC

## 2020-02-27 MED ORDER — SODIUM CHLORIDE 0.9 % IV BOLUS
500.0000 mL | Freq: Once | INTRAVENOUS | Status: AC
  Administered 2020-02-27: 500 mL via INTRAVENOUS

## 2020-02-27 NOTE — Progress Notes (Addendum)
RN called to room by physical therapist. Orthostatic vitals were as follows:  02/27/20 1300  Orthostatic Lying   BP- Lying 92/55  Pulse- Lying 80  Orthostatic Sitting  BP- Sitting (!) 85/64  Pulse- Sitting 84  Orthostatic Standing at 0 minutes  BP- Standing at 0 minutes (!) 80/53  Pulse- Standing at 0 minutes 92  Orthostatic Standing at 3 minutes  BP- Standing at 3 minutes (!) 64/57  Pulse- Standing at 3 minutes 104  Oxygen Therapy  SpO2 93 %  O2 Device Room Air  Physical therapists walked patient to door and back to bed, patient denied any chest discomfort or shortness of breath or dizziness, but states "I just feel wooshy". Arrien, MD aware- currently at bedside.

## 2020-02-27 NOTE — Progress Notes (Signed)
Patient with severe orthostatic hypotension.   Supine 92/55 with HR 80 Seating  85/64 with HR 84 Standing 80/53 with HR 92 Standing 3 minutes 64/57 HR 104.  Will cancel dc for today, will give 500 cc bolus of isotonic saline and continue hydration with saline at 75 ml per H, will recheck orthostatics in am.   Patient is in high risk for recurrent orthostatic syncope, will need further inpatient treatment and monitoring.

## 2020-02-27 NOTE — Progress Notes (Signed)
She developed severe symptomatic orthostatic hypotension after standing for 3 minutes (systolic blood pressure dropped from 92-64).  Improved with supine position but also developed clear-cut symptoms of vertigo consistent with benign positional vertigo, right-sided.  Improved after Epley maneuver. Unfortunately her echocardiogram does show a new regional wall motion abnormality involving the anterior apex, suggestive of possible distal LAD injury and her troponin has increased to 5000, after showing a declining trend. While this may represent cardiac contusion from her fall, it is also possible to represent coronary insufficiency in the distal LAD artery distribution. For the time being will focus on improvement of her volume status to compensate for the orthostatic hypotension.  All antihypertensive medications have been stopped.  Encourage intake of salt and water. Patient may benefit from angiography of her grafts, particularly the LIMA bypass.   She is still very woozy. We will discuss further evaluation of the wall motion abnormality tomorrow.

## 2020-02-27 NOTE — Progress Notes (Signed)
  Echocardiogram 2D Echocardiogram has been performed.  Burnard Hawthorne 02/27/2020, 10:55 AM

## 2020-02-27 NOTE — Evaluation (Addendum)
Physical Therapy Evaluation Patient Details Name: Felicia [name] MRN: [id number] DOB: 1961/02/15 Today's Date: 02/27/2020   History of Present Illness  59 year old female who presented after mechanical fall and syncope complicated by left radius/ulna fracture, left orbital wall, lateral maxillary wall, inferior orbital floor fracture.  PMHx: coronary artery disease status post 5 vessel bypass February 21, 2020, T2DM, HTN and DLD She was discharged from the hospital 2 days ago.   Clinical Impression  Patient presents with decreased mobility due to orthostatic hypotension, decreased use of UE's with sternal precautions and now L UE fractures and now positive for R BPPV.  She has spouse to assist at home and feel she will need continued skilled PT in the acute setting for further BPPV treatment and follow up outpatient PT for vestibular rehab. Orthostatic VS for the past 24 hrs (Last 3 readings):  BP- Lying Pulse- Lying BP- Sitting Pulse- Sitting BP- Standing at 0 minutes Pulse- Standing at 0 minutes BP- Standing at 3 minutes Pulse- Standing at 3 minutes  02/27/20 1300 92/55 80 (!) 85/64 84 (!) 80/53 92 (!) 64/57 104      Follow Up Recommendations Outpatient PT(vestibular)    Equipment Recommendations  None recommended by PT    Recommendations for Other Services       Precautions / Restrictions Precautions Precautions: Fall;Sternal Precaution Comments: orthostatic +; uses heart pillow, splint on L UE Restrictions Weight Bearing Restrictions: Yes LUE Weight Bearing: Non weight bearing(through wrist) Other Position/Activity Restrictions: sternal precautions      Mobility  Bed Mobility Overal bed mobility: Needs Assistance Bed Mobility: Rolling;Sidelying to Sit Rolling: Min guard Sidelying to sit: Min assist     Sit to sidelying: Min guard General bed mobility comments: Pt able to guide self without use of LUE and abiding by sternal precautions by hugging heart pillow. Pt's HOB  elevated  Transfers Overall transfer level: Needs assistance Equipment used: None Transfers: Sit to/from Stand Sit to Stand: Min guard;Min assist         General transfer comment: Pt ranging from minguardA to minA overall for sit to stands based on "whooshy" feeling with positional change.  Pt hugging heart pillow for transfers and mobility  Ambulation/Gait Ambulation/Gait assistance: Min assist Gait Distance (Feet): 20 Feet Assistive device: None Gait Pattern/deviations: Step-through pattern;Decreased stride length     General Gait Details: hugging pillow and holding L arm not wanting sling, min A for safety due to orthostatic, tried to walk further, but felt bad so sat and took BP then pt had to lay down  Stairs            Wheelchair Mobility    Modified Rankin (Stroke Patients Only)       Balance Overall balance assessment: Needs assistance Sitting-balance support: No upper extremity supported;Feet supported Sitting balance-Leahy Scale: Fair     Standing balance support: No upper extremity supported Standing balance-Leahy Scale: Poor Standing balance comment: Pt able to stand with minguardA for stability                             Pertinent Vitals/Pain Pain Assessment: Faces Faces Pain Scale: Hurts little more Pain Location: chest Pain Descriptors / Indicators: Discomfort Pain Intervention(s): Monitored during session;Limited activity within patient's tolerance;Repositioned    Home Living Family/patient expects to be discharged to:: Private residence Living Arrangements: Spouse/significant other Available Help at Discharge: Family Type of Home: House Home Access: Level entry  Home Layout: Two level;Able to live on main level with bedroom/bathroom Home Equipment: Bedside commode      Prior Function Level of Independence: Independent         Comments: Up until fall, pt was restarting ADL routine and spouse is very supportive of  her. Pt reports that she was performing her own ADL.     Hand Dominance   Dominant Hand: Right    Extremity/Trunk Assessment   Upper Extremity Assessment Upper Extremity Assessment: Defer to OT evaluation RUE Deficits / Details: AROm, WFLs LUE Deficits / Details: LUE ulna/radius fx in sugar tong splint to PIPs able to wiggle digits and lift LUE up slightly. LUE Coordination: decreased fine motor;decreased gross motor    Lower Extremity Assessment Lower Extremity Assessment: Generalized weakness    Cervical / Trunk Assessment Cervical / Trunk Assessment: Normal  Communication   Communication: No difficulties  Cognition Arousal/Alertness: Awake/alert Behavior During Therapy: WFL for tasks assessed/performed Overall Cognitive Status: Within Functional Limits for tasks assessed                                 General Comments: Pt reporting positional changes with a "whooshing feeling" rather than dizziness or light headedness.       General Comments General comments (skin integrity, edema, etc.): Patient in supine used bed in trendelenberg to check for BPPV, noted on R positive rotary nystagmus lasting <20 sec, performed Eply x 1 with bed in trendelenberg with + syptoms in last turn, assisted to supine HOB up and educated PT to return tomorrow    Exercises     Assessment/Plan    PT Assessment Patient needs continued PT services  PT Problem List Decreased strength;Decreased activity tolerance;Decreased mobility;Decreased safety awareness;Decreased knowledge of use of DME;Decreased balance       PT Treatment Interventions DME instruction;Stair training;Therapeutic activities;Balance training;Patient/family education;Therapeutic exercise;Functional mobility training;Gait training;Other (comment)(canalith repositioning)    PT Goals (Current goals can be found in the Care Plan section)  Acute Rehab PT Goals Patient Stated Goal: to get rid of this whooshy feeling  and fear of falling PT Goal Formulation: With patient/family Time For Goal Achievement: 03/12/20 Potential to Achieve Goals: Good    Frequency Min 3X/week   Barriers to discharge        Co-evaluation PT/OT/SLP Co-Evaluation/Treatment: Yes Reason for Co-Treatment: For patient/therapist safety PT goals addressed during session: Mobility/safety with mobility;Balance         AM-PAC PT "6 Clicks" Mobility  Outcome Measure Help needed turning from your back to your side while in a flat bed without using bedrails?: A Little Help needed moving from lying on your back to sitting on the side of a flat bed without using bedrails?: A Little Help needed moving to and from a bed to a chair (including a wheelchair)?: A Little Help needed standing up from a chair using your arms (e.g., wheelchair or bedside chair)?: A Little Help needed to walk in hospital room?: A Little Help needed climbing 3-5 steps with a railing? : A Little 6 Click Score: 18    End of Session   Activity Tolerance: Other (comment);Treatment limited secondary to medical complications (Comment)(limited due to orthostatic) Patient left: in bed;with call bell/phone within reach;with family/visitor present   PT Visit Diagnosis: Difficulty in walking, not elsewhere classified (R26.2);Dizziness and giddiness (R42);Muscle weakness (generalized) (M62.81)    Time: 1330-1402 PT Time Calculation (min) (ACUTE ONLY): 32 min  Charges:   PT Evaluation $PT Eval Moderate Complexity: [place], [place] Acute Rehabilitation Services [phone] 02/27/2020   Reginia Naas 02/27/2020, 4:30 PM

## 2020-02-27 NOTE — Discharge Summary (Signed)
Physician Discharge Summary  Felicia [name]:[id number] DOB: 11-15-1960 DOA: 02/25/2020  PCP: [name], MD  Admit date: 02/25/2020 Discharge date: 02/27/2020  Admitted From: Home  Disposition:  Home   Recommendations for Outpatient Follow-up and new medication changes:  1. Follow up with Dr. [name] in 7 days.  2. Discontinue lisinopril, metoprolol and diuretics for now. 3. Outpatient cardiac monitoring. 4. Follow with orthopedics as outpatient    Home Health: no   Equipment/Devices: no   Discharge Condition: stable CODE STATUS: full  Diet recommendation: heart healthy   Brief/Interim Summary: Patient was admitted to the hospital with a working diagnosis of syncope complicated with left distal radius and ulna fractures, nondisplaced orbital and maxillary fractures, in the setting of elevated troponin   59 year old female who presented after mechanical fall and syncope.  She does have significant past medical history for coronary artery disease status post 5 vessel bypass February 21, 2020, she has type 2 diabetes mellitus, hypertension, and dyslipidemia.  She was discharged from the hospital 2 days ago.  Apparently patient was sitting watching TV, then she stood up to go to the bathroom with the intention to move her bowels, while walking she had a syncope episode with prodromal dizziness.  Post trauma she had facial and left wrist pain.   On her initial physical examination her blood pressure was 135/75, heart rate 85, respiratory rate 26, oxygen saturation 94%, her lungs are clear to auscultation bilaterally, heart S1-S2 present rhythm, abdomen was soft, no lower extremity edema, she had a left wrist splint in place. Sodium 138, potassium 4.1, chloride 95, bicarb 29, glucose 144, BUN 23, creatinine 1.16, troponin I 4,550, white count 9.5, hemoglobin 13.4, hematocrit 41.2, platelets 247.  Head CT no acute changes.  EKG 84 bpm, normal axis, normal intervals, sinus rhythm, no ST  segment or T wave changes.  Patient was admitted to the progressive care unit, telemetry with no arrhythmias.  Her troponins remained elevated but not in a acute coronary syndrome pattern.   1.  Syncope, complicated by left radius/ulna fracture, left orbital wall, lateral maxillary wall, inferior orbital floor fracture.  No arrhythmias on telemetry monitoring.  Her symptoms were more consistent with possible orthostatic syncope, neurocardiogenic in origin.  Acute coronary syndrome has been ruled out.  Follow-up echocardiography still pending.  Patient will be discharged with no ACE inhibitors or beta blockade to prevent further hypotensive episodes that can leading to syncope.  Outpatient cardiac monitoring to rule out arrhythmias.  Patient will follow up with orthopedics as an outpatient.  2.  Coronary status post CABG, elevated troponins.  Electrocardiogram with no ischemic changes, troponin elevation with no acute coronary syndrome pattern.  Continue aspirin and statin, holding beta-blockade and ACE inhibitor due to risk of orthostatic hypotension.  3.  Uncontrolled type 1 diabetes mellitus/dyslipidemia.  Patient was placed on insulin regimen, basal and sliding scale with good toleration, follow-up as an outpatient.  Continue pravastatin and ezetimibe.  4.  Hypertension.  Hypertensive agents were held, systolic blood pressure at discharge is 128, patient will follow-up as an outpatient.  Discharge Diagnoses:  Principal Problem:   Syncope Active Problems:   S/P CABG (coronary artery bypass graft)   CAD (coronary artery disease)   Radius and ulna distal fracture   Orbital fracture (HCC)    Discharge Instructions   Allergies as of 02/27/2020      Reactions   Protonix [pantoprazole] Swelling   Facial Swelling   Toujeo Solostar [insulin Glargine] Shortness Of  Breath   Lactose Intolerance (gi)    GI   Magnesium-containing Compounds Swelling, Other (See Comments)   limbs   Adhesive  [tape] Rash, Other (See Comments)   Band aids      Medication List    STOP taking these medications   furosemide 40 MG tablet Commonly known as: LASIX   lisinopril 5 MG tablet Commonly known as: ZESTRIL   metoprolol tartrate 25 MG tablet Commonly known as: LOPRESSOR   potassium chloride SA 20 MEQ tablet Commonly known as: KLOR-CON   traMADol 50 MG tablet Commonly known as: ULTRAM     TAKE these medications   acetaminophen 325 MG tablet Commonly known as: TYLENOL Take 2 tablets (650 mg total) by mouth every 6 (six) hours as needed for mild pain.   aspirin 325 MG EC tablet Take 1 tablet (325 mg total) by mouth daily.   esomeprazole 40 MG capsule Commonly known as: NEXIUM Take 1 capsule (40 mg total) by mouth daily. Patient needs office visit for further refills   ezetimibe 10 MG tablet Commonly known as: ZETIA Take 1 tablet (10 mg total) by mouth daily.   glucagon 1 MG injection Inject 1 mg into the muscle once as needed (in case of severe hypoglycemia).   insulin regular 100 units/mL injection Commonly known as: NovoLIN R INJECT 6 UNITS TOTAL INTO THE SKIN 3 (THREE) TIMES DAILY BEFORE MEALS. What changed:   how much to take  how to take this  when to take this  additional instructions   Insulin Syringe-Needle U-100 30G X 5/16" 0.3 ML Misc Use to inject insulin 6 times a day.   Lantus SoloStar 100 UNIT/ML Solostar Pen Generic drug: insulin glargine Inject 12 Units into the skin daily. What changed:   how much to take  when to take this  additional instructions   multivitamin tablet Take 1 tablet by mouth daily.   NovoFine 32G X 6 MM Misc Generic drug: Insulin Pen Needle USE TO INJECT INSULIN 4 TIMES A DAY   pravastatin 40 MG tablet Commonly known as: PRAVACHOL TAKE 1.5 TABLETS (60 MG TOTAL) BY MOUTH DAILY. What changed: See the new instructions.      Follow-up Information    Mack Hookhompson, David, MD Follow up.   Specialty: Orthopedic  Surgery Why: my office will contact you this week to set up an appointment for next week to re-check your wrist fracture Contact information: [address] [place] [address] [phone]        Antonieta IbaGollan, Timothy J, MD Follow up.   Specialty: Cardiology Why: We will arrange for heart monitor and contact you.  Keep current appts with Dr. Windell HummingbirdGollan's office. Contact information: [address] [place] [address] [phone]          Allergies  Allergen Reactions  . Protonix [Pantoprazole] Swelling    Facial Swelling  . Toujeo Solostar [Insulin Glargine] Shortness Of Breath  . Lactose Intolerance (Gi)     GI  . Magnesium-Containing Compounds Swelling and Other (See Comments)    limbs  . Adhesive [Tape] Rash and Other (See Comments)    Band aids    Consultations:  Cardiology   Orthopedics    Procedures/Studies: DG Chest 2 View  Result Date: 02/07/2020 CLINICAL DATA:  Chest pain EXAM: CHEST - 2 VIEW COMPARISON:  April 21, 2015 FINDINGS: The heart size and mediastinal contours are within normal limits. Both lungs are clear. The visualized skeletal structures are unremarkable. IMPRESSION: No active cardiopulmonary  disease. Electronically Signed   By: Jonna Clark M.D.   On: 02/07/2020 11:46   DG Wrist Complete Left  Result Date: 02/25/2020 CLINICAL DATA:  Fall EXAM: LEFT WRIST - COMPLETE 3+ VIEW COMPARISON:  None. FINDINGS: There is a slightly impacted nondisplaced distal radius fracture seen. A mildly displaced ulnar styloid fracture is noted. Diffuse overlying soft tissue swelling is seen. There is diffuse osteopenia noted. IMPRESSION: Slightly impacted nondisplaced distal radius fracture. Mildly displaced ulnar styloid fracture. Electronically Signed   By: Jonna Clark M.D.   On: 02/25/2020 23:40   CT HEAD WO CONTRAST  Result Date: 02/26/2020 CLINICAL DATA:  Headache, posttraumatic EXAM: CT HEAD WITHOUT CONTRAST TECHNIQUE: Contiguous axial images were  obtained from the base of the skull through the vertex without intravenous contrast. COMPARISON:  None. FINDINGS: Brain: No evidence of acute territorial infarction, hemorrhage, hydrocephalus,extra-axial collection or mass lesion/mass effect. Normal gray-white differentiation. Ventricles are normal in size and contour. Vascular: No hyperdense vessel or unexpected calcification. Skull: The skull is intact. There is a mildly displaced obliquely oriented fracture seen through the lateral orbital wall. There is a comminuted mildly impacted fracture seen through the lateral maxillary wall and inferior orbital floor extends through the infraorbital foramen. No displacement of the inferior orbital fat. There is small foci of air seen within the inferior orbit soft tissues. Sinuses/Orbits: The visualized paranasal sinuses and mastoid air cells are clear. The orbits and globes intact. Other: Right periorbital soft tissue swelling is seen. There is also soft tissue swelling seen over the right frontal skull. The orbits however appear to be intact. No retro-orbital fluid collections are noted. IMPRESSION: No acute intracranial abnormality. Nondisplaced comminuted fractures through the lateral orbital wall, lateral maxillary wall common inferior orbital floor. No displacement of the inferior orbital fat. Soft tissue swelling seen over the right frontal skull and right periorbital soft tissues. Electronically Signed   By: Jonna Clark M.D.   On: 02/26/2020 01:03   CARDIAC CATHETERIZATION  Result Date: 02/17/2020  1st Mrg lesion is 60% stenosed.  Prox LAD to Mid LAD lesion is 90% stenosed.  Mid LAD-1 lesion is 80% stenosed.  Mid LAD-2 lesion is 50% stenosed.  1st Diag-1 lesion is 95% stenosed.  1st Diag-2 lesion is 70% stenosed.  Ost RCA to Prox RCA lesion is 70% stenosed.  Mid RCA lesion is 70% stenosed.  RPDA lesion is 50% stenosed.  The left ventricular ejection fraction is greater than 65% by visual estimate.   LV end diastolic pressure is normal.  The left ventricular systolic function is normal.  There is no mitral valve regurgitation.    DG Chest Port 1 View  Result Date: 02/25/2020 CLINICAL DATA:  Larey Seat, syncope, recent CABG EXAM: PORTABLE CHEST 1 VIEW COMPARISON:  02/23/2020 FINDINGS: Single frontal view of the chest demonstrates stable postsurgical changes from bypass surgery. The cardiac silhouette is stable. Improved volume status, with resolution of airspace disease and effusions seen previously. Minimal scarring or atelectasis at the left base. No pneumothorax. No acute bony abnormalities. IMPRESSION: 1. Minimal left basilar atelectasis. 2. No acute intrathoracic process. Electronically Signed   By: Sharlet [name] M.D.   On: 02/25/2020 23:41   DG Chest Port 1 View  Result Date: 02/23/2020 CLINICAL DATA:  Bypass surgery. EXAM: PORTABLE CHEST 1 VIEW COMPARISON:  02/22/2020 FINDINGS: The right IJ catheter is stable. The left chest tube and mediastinal drain tubes are stable. No pneumothorax. Persistent bibasilar atelectasis but no pleural effusions or pulmonary edema. IMPRESSION: 1. Stable  support apparatus. 2. Bibasilar atelectasis. Electronically Signed   By: Rudie Meyer M.D.   On: 02/23/2020 09:04   DG Chest Port 1 View  Result Date: 02/22/2020 CLINICAL DATA:  Status post CABG. Chest tubes in place. EXAM: PORTABLE CHEST 1 VIEW COMPARISON:  02/21/2020 FINDINGS: The endotracheal tube and NG tube have been removed. Chest tubes and central venous catheter remain in place, unchanged. No pneumothorax. Slight haziness at both lung bases probably represents tiny effusions. CABG. The cardiac silhouette and pulmonary vascularity are normal. No bone abnormality. IMPRESSION: Slight bibasilar  effusions. No pneumothorax. Electronically Signed   By: Francene Boyers M.D.   On: 02/22/2020 09:25   DG Chest Port 1 View  Result Date: 02/21/2020 CLINICAL DATA:  Status post CABG. EXAM: PORTABLE CHEST 1 VIEW  COMPARISON:  One-view chest x-ray 02/21/2020 at 6:26 a.m. FINDINGS: Patient is now status post CABG for median sternotomy. Graft markers are noted. Endotracheal tube terminates 3 cm above the carina. Side port of the NG tube is in the stomach. Mediastinal drains and left pleural drain is present. No pneumothorax is present. Right IJ catheter terminates at the cavoatrial junction. Lung volumes are low with moderate pulmonary vascular congestion. No significant effusions are present. IMPRESSION: 1. Status post CABG for median sternotomy without radiographic evidence for complication. 2. Moderate pulmonary vascular congestion. 3. The support apparatus is satisfactory. Electronically Signed   By: Marin Roberts M.D.   On: 02/21/2020 15:16   DG CHEST PORT 1 VIEW  Result Date: 02/21/2020 CLINICAL DATA:  59 year old female preoperative study for CABG. EXAM: PORTABLE CHEST 1 VIEW COMPARISON:  Chest radiographs 02/07/2020 and earlier. FINDINGS: Portable AP semi upright view at 0627 hours. Lung volumes and mediastinal contours remain normal. Visualized tracheal air column is within normal limits. Allowing for portable technique the lungs are clear. Negative visible bowel gas and osseous structures. IMPRESSION: Negative portable chest. Electronically Signed   By: Odessa Fleming M.D.   On: 02/21/2020 08:38   ECHOCARDIOGRAM COMPLETE  Result Date: 02/17/2020    ECHOCARDIOGRAM REPORT   Patient Name:   JAMITA MCKELVIN Date of Exam: 02/17/2020 Medical Rec #:  [id number]      Height:       64.0 in Accession #:    [id number]     Weight:       136.0 lb Date of Birth:  12-14-1960      BSA:          1.661 m Patient Age:    59 years       BP:           185/81 mmHg Patient Gender: F              HR:           82 bpm. Exam Location:  Inpatient Procedure: 2D Echo Indications:    coronary artery disease.  History:        Patient has prior history of Echocardiogram examinations, most                 recent 01/19/2013. Signs/Symptoms:Chest  Pain; Risk                 Factors:Hypertension, Diabetes and Dyslipidemia.  Sonographer:    Delcie Roch Referring Phys: [id number] HARRELL O LIGHTFOOT IMPRESSIONS  1. Left ventricular ejection fraction, by estimation, is 65 to 70%. The left ventricle has normal function. The left ventricle has no regional wall motion abnormalities. Left ventricular diastolic parameters are consistent with  Grade I diastolic dysfunction (impaired relaxation).  2. Right ventricular systolic function is normal. The right ventricular size is normal.  3. The mitral valve is normal in structure. Mild mitral valve regurgitation. No evidence of mitral stenosis.  4. The aortic valve is normal in structure. Aortic valve regurgitation is not visualized. Mild to moderate aortic valve sclerosis/calcification is present, without any evidence of aortic stenosis.  5. The inferior vena cava is normal in size with greater than 50% respiratory variability, suggesting right atrial pressure of 3 mmHg. FINDINGS  Left Ventricle: Left ventricular ejection fraction, by estimation, is 65 to 70%. The left ventricle has normal function. The left ventricle has no regional wall motion abnormalities. The left ventricular internal cavity size was normal in size. There is  no left ventricular hypertrophy. Left ventricular diastolic parameters are consistent with Grade I diastolic dysfunction (impaired relaxation). Right Ventricle: The right ventricular size is normal. No increase in right ventricular wall thickness. Right ventricular systolic function is normal. Left Atrium: Left atrial size was normal in size. Right Atrium: Right atrial size was normal in size. Pericardium: There is no evidence of pericardial effusion. Mitral Valve: The mitral valve is normal in structure. Normal mobility of the mitral valve leaflets. Mild mitral valve regurgitation. No evidence of mitral valve stenosis. Tricuspid Valve: The tricuspid valve is normal in structure. Tricuspid  valve regurgitation is trivial. No evidence of tricuspid stenosis. Aortic Valve: The aortic valve is normal in structure. Aortic valve regurgitation is not visualized. Mild to moderate aortic valve sclerosis/calcification is present, without any evidence of aortic stenosis. Pulmonic Valve: The pulmonic valve was normal in structure. Pulmonic valve regurgitation is not visualized. No evidence of pulmonic stenosis. Aorta: The aortic root is normal in size and structure. Venous: The inferior vena cava is normal in size with greater than 50% respiratory variability, suggesting right atrial pressure of 3 mmHg. IAS/Shunts: No atrial level shunt detected by color flow Doppler.  LEFT VENTRICLE PLAX 2D LVIDd:         3.70 cm  Diastology LVIDs:         2.40 cm  LV e' lateral:   11.30 cm/s LV PW:         0.70 cm  LV E/e' lateral: 11.0 LV IVS:        0.80 cm  LV e' medial:    8.38 cm/s LVOT diam:     1.90 cm  LV E/e' medial:  14.8 LV SV:         62 LV SV Index:   37 LVOT Area:     2.84 cm  RIGHT VENTRICLE             IVC RV S prime:     16.40 cm/s  IVC diam: 1.20 cm TAPSE (M-mode): 2.6 cm LEFT ATRIUM             Index LA diam:        3.00 cm 1.81 cm/m LA Vol (A2C):   29.6 ml 17.82 ml/m LA Vol (A4C):   28.4 ml 17.10 ml/m LA Biplane Vol: 29.2 ml 17.58 ml/m  AORTIC VALVE LVOT Vmax:   114.00 cm/s LVOT Vmean:  72.100 cm/s LVOT VTI:    0.219 m  AORTA Ao Root diam: 2.60 cm MITRAL VALVE MV Area (PHT): 3.63 cm     SHUNTS MV Decel Time: 209 msec     Systemic VTI:  0.22 m MV E velocity: 124.00 cm/s  Systemic Diam: 1.90 cm MV A velocity:  105.00 cm/s MV E/A ratio:  1.18 Tobias Alexander MD Electronically signed by Tobias Alexander MD Signature Date/Time: 02/17/2020/6:14:03 PM    Final    ECHO INTRAOPERATIVE TEE  Result Date: 02/21/2020  *INTRAOPERATIVE TRANSESOPHAGEAL REPORT *  Patient Name:   KAZZANDRA DESAULNIERS Date of Exam: 02/21/2020 Medical Rec #:  [id number]      Height:       64.0 in Accession #:    [id number]     Weight:       132.5  lb Date of Birth:  07-30-1961      BSA:          1.64 m Patient Age:    59 years       BP:           140/72 mmHg Patient Gender: F              HR:           63 bpm. Exam Location:  Anesthesiology Transesophogeal exam was perform intraoperatively during surgical procedure. Patient was closely monitored under general anesthesia during the entirety of examination. Indications:     Coronary artery disease Performing Phys: Karna Christmas MD Diagnosing Phys: Karna Christmas MD Complications: No known complications during this procedure. POST-OP IMPRESSIONS Overall, there were no significant changes from pre-bypass. - Left Ventricle: The left ventricle is unchanged from pre-bypass. - Right Ventricle: The right ventricle appears unchanged from pre-bypass. - Aorta: The aorta appears unchanged from pre-bypass. - Left Atrium: The left atrium appears unchanged from pre-bypass. - Left Atrial Appendage: The left atrial appendage appears unchanged from pre-bypass. - Aortic Valve: The aortic valve appears unchanged from pre-bypass. - Mitral Valve: There is trace regurgitation. - Tricuspid Valve: The tricuspid valve appears unchanged from pre-bypass. - Interatrial Septum: The interatrial septum appears unchanged from pre-bypass. - Interventricular Septum: The interventricular septum appears unchanged from pre-bypass. - Pericardium: The pericardium appears unchanged from pre-bypass. PRE-OP FINDINGS  Left Ventricle: The left ventricle has hyperdynamic systolic function, with an ejection fraction of >65%. The cavity size was normal. There is no increase in left ventricular wall thickness. Right Ventricle: The right ventricle has normal systolic function. The cavity was normal. There is no increase in right ventricular wall thickness. Left Atrium: Left atrial size was normal in size. Right Atrium: Right atrial size was normal in size. Interatrial Septum: No atrial level shunt detected by color flow Doppler. Pericardium: There is no  evidence of pericardial effusion. Mitral Valve: The mitral valve is normal in structure. Mitral valve regurgitation is not visualized by color flow Doppler. Tricuspid Valve: The tricuspid valve was normal in structure. Tricuspid valve regurgitation was not visualized by color flow Doppler. Aortic Valve: The aortic valve is normal in structure. There is mild calcification on the non-coronary cusp of the aortic valve. Aortic valve regurgitation was not visualized by color flow Doppler. There is no evidence of aortic valve stenosis. Pulmonic Valve: The pulmonic valve was normal in structure. Pulmonic valve regurgitation is not visualized by color flow Doppler. Aorta: There is evidence of plaque in the descending aorta; Grade I, measuring 1-26mm in size. +--------------+-------++ LEFT VENTRICLE        +--------------+-------++ PLAX 2D               +--------------+-------++ LVIDd:        3.57 cm +--------------+-------++ LVIDs:        2.24 cm +--------------+-------++ LV PW:        0.75 cm +--------------+-------++ LV IVS:  0.64 cm +--------------+-------++ LV SV:        36 ml   +--------------+-------++ LV SV Index:  22.02   +--------------+-------++                       +--------------+-------++ +-------------+-----------++ AORTIC VALVE             +-------------+-----------++ AV Vmax:     110.00 cm/s +-------------+-----------++ AV Vmean:    78.400 cm/s +-------------+-----------++ AV VTI:      0.277 m     +-------------+-----------++ AV Peak Grad:4.8 mmHg    +-------------+-----------++ AV Mean Grad:3.0 mmHg    +-------------+-----------++  +------------+-------++ AORTA               +------------+-------++ Ao Asc diam:2.80 cm +------------+-------++  Karna Christmas MD Electronically signed by Karna Christmas MD Signature Date/Time: 02/21/2020/2:57:24 PM    Final    VAS US DOPPLER PRE CABG  Result Date: 02/18/2020 PREOPERATIVE VASCULAR  EVALUATION  Indications:      Pre-CABG. Risk Factors:     Hypertension, Diabetes, coronary artery disease. Comparison Study: no prior Performing Technologist: Blanch Media RVS  Examination Guidelines: A complete evaluation includes B-mode imaging, spectral Doppler, color Doppler, and power Doppler as needed of all accessible portions of each vessel. Bilateral testing is considered an integral part of a complete examination. Limited examinations for reoccurring indications may be performed as noted.  Right Carotid Findings: +----------+--------+--------+--------+------------+--------+           PSV cm/sEDV cm/sStenosisDescribe    Comments +----------+--------+--------+--------+------------+--------+ CCA Prox  86      17              heterogenous         +----------+--------+--------+--------+------------+--------+ CCA Distal58      19              heterogenous         +----------+--------+--------+--------+------------+--------+ ICA Prox  57      18      1-39%   heterogenous         +----------+--------+--------+--------+------------+--------+ ICA Distal49      19                                   +----------+--------+--------+--------+------------+--------+ ECA       80      10                                   +----------+--------+--------+--------+------------+--------+ Portions of this table do not appear on this page. +----------+--------+-------+--------+------------+           PSV cm/sEDV cmsDescribeArm Pressure +----------+--------+-------+--------+------------+ Subclavian78                                  +----------+--------+-------+--------+------------+ +---------+--------+--+--------+-+---------+ VertebralPSV cm/s36EDV cm/s9Antegrade +---------+--------+--+--------+-+---------+ Left Carotid Findings: +----------+--------+--------+--------+------------+--------+           PSV cm/sEDV cm/sStenosisDescribe    Comments  +----------+--------+--------+--------+------------+--------+ CCA Prox  98      24              heterogenous         +----------+--------+--------+--------+------------+--------+ CCA Distal55      19              heterogenous         +----------+--------+--------+--------+------------+--------+  ICA Prox  59      19      1-39%   heterogenous         +----------+--------+--------+--------+------------+--------+ ICA Distal81      22                                   +----------+--------+--------+--------+------------+--------+ ECA       64      10                                   +----------+--------+--------+--------+------------+--------+ +----------+--------+--------+--------+------------+ SubclavianPSV cm/sEDV cm/sDescribeArm Pressure +----------+--------+--------+--------+------------+           96                      89          +----------+--------+--------+--------+------------+ +---------+--------+--+--------+--+---------+ VertebralPSV cm/s40EDV cm/s12Antegrade +---------+--------+--+--------+--+---------+  ABI Findings: +--------+------------------+-----+---------+--------+ Right   Rt Pressure (mmHg)IndexWaveform Comment  +--------+------------------+-----+---------+--------+ Brachial                       triphasiciv       +--------+------------------+-----+---------+--------+ PTA     146               1.01 triphasic         +--------+------------------+-----+---------+--------+ DP      156               1.08 triphasic         +--------+------------------+-----+---------+--------+ +--------+------------------+-----+---------+-------+ Left    Lt Pressure (mmHg)IndexWaveform Comment +--------+------------------+-----+---------+-------+ [id number]                    triphasic        +--------+------------------+-----+---------+-------+ PTA     131               0.90 triphasic         +--------+------------------+-----+---------+-------+ DP      154               1.06 triphasic        +--------+------------------+-----+---------+-------+ +-------+---------------+----------------+ ABI/TBIToday's ABI/TBIPrevious ABI/TBI +-------+---------------+----------------+ Right  1.08                            +-------+---------------+----------------+ Left   1.06                            +-------+---------------+----------------+  Right Doppler Findings: +--------+--------+-----+---------+--------+ Site    PressureIndexDoppler  Comments +--------+--------+-----+---------+--------+ Brachial             triphasiciv       +--------+--------+-----+---------+--------+ Radial               triphasic         +--------+--------+-----+---------+--------+ Ulnar                triphasic         +--------+--------+-----+---------+--------+  Left Doppler Findings: +--------+--------+-----+---------+--------+ Site    PressureIndexDoppler  Comments +--------+--------+-----+---------+--------+ [id number]          triphasic         +--------+--------+-----+---------+--------+ Radial               triphasic         +--------+--------+-----+---------+--------+ Ulnar  triphasic         +--------+--------+-----+---------+--------+  Summary: Right Carotid: Velocities in the right ICA are consistent with a 1-39% stenosis. Left Carotid: Velocities in the left ICA are consistent with a 1-39% stenosis. Vertebrals: Bilateral vertebral arteries demonstrate antegrade flow. Right ABI: Resting right ankle-brachial index is within normal range. No evidence of significant right lower extremity arterial disease. Left ABI: Resting left ankle-brachial index is within normal range. No evidence of significant left lower extremity arterial disease. Right Upper Extremity: Doppler waveforms remain within normal limits with right radial compression. Doppler waveforms  remain within normal limits with right ulnar compression. Left Upper Extremity: Doppler waveform obliterate with left radial compression. Doppler waveforms remain within normal limits with left ulnar compression.  Electronically signed by Sherald Hess MD on 02/18/2020 at 4:04:18 PM.    Final        Subjective: Patient is feeling better, no further orthostatic symptoms, no nausea or vomiting, no chest pain or dyspnea.   Discharge Exam: Vitals:   02/27/20 0744 02/27/20 0815  BP: (!) 87/50 (!) 128/111  Pulse: 77 78  Resp: 20 16  Temp: 98.5 F (36.9 C)   SpO2: 91% 93%   Vitals:   02/27/20 0030 02/27/20 0448 02/27/20 0744 02/27/20 0815  BP: (!) 99/50 123/65 (!) 87/50 (!) 128/111  Pulse: 81 81 77 78  Resp: 14 12 20 16   Temp: 98.8 F (37.1 C) 98.3 F (36.8 C) 98.5 F (36.9 C)   TempSrc: Oral Oral Oral   SpO2: 95% 97% 91% 93%  Weight:  61.9 kg    Height:        General: Not in pain or dyspnea, Neurology: Awake and alert, non focal  E ENT: no pallor, no icterus, oral mucosa moist Cardiovascular: No JVD. S1-S2 present, rhythmic, no gallops, rubs, or murmurs. No lower extremity edema. Pulmonary: vesicular breath sounds bilaterally, adequate air movement, no wheezing, rhonchi or rales. Gastrointestinal. Abdomen with, no organomegaly, non tender, no rebound or guarding Skin. Surgical sternal wound is clean and closed.  Musculoskeletal: left upper extremity in cast, no local edema,    The results of significant diagnostics from this hospitalization (including imaging, microbiology, ancillary and laboratory) are listed below for reference.     Microbiology: Recent Results (from the past 240 hour(s))  Surgical pcr screen     Status: None   Collection Time: 02/19/20  6:00 PM   Specimen: Nasal Mucosa; Nasal Swab  Result Value Ref Range Status   MRSA, PCR NEGATIVE NEGATIVE Final   Staphylococcus aureus NEGATIVE NEGATIVE Final    Comment: (NOTE) The Xpert SA Assay (FDA  approved for NASAL specimens in patients 44 years of age and older), is one component of a comprehensive surveillance program. It is not intended to diagnose infection nor to guide or monitor treatment. Performed at [facility], [address], [place], [place] [address]   Respiratory Panel by RT PCR (Flu A&B, Covid) - Nasopharyngeal Swab     Status: None   Collection Time: 02/26/20  4:04 AM   Specimen: Nasopharyngeal Swab  Result Value Ref Range Status   SARS Coronavirus 2 by RT PCR NEGATIVE NEGATIVE Final    Comment: (NOTE) SARS-CoV-2 target nucleic acids are NOT DETECTED. The SARS-CoV-2 RNA is generally detectable in upper respiratoy specimens during the acute phase of infection. The lowest concentration of SARS-CoV-2 viral copies this assay can detect is 131 copies/mL. A negative result does not preclude SARS-Cov-2 infection and should not be used  as the sole basis for treatment or other patient management decisions. A negative result may occur with  improper specimen collection/handling, submission of specimen other than nasopharyngeal swab, presence of viral mutation(s) within the areas targeted by this assay, and inadequate number of viral copies (<131 copies/mL). A negative result must be combined with clinical observations, patient history, and epidemiological information. The expected result is Negative. Fact Sheet for Patients:  PinkCheek.be Fact Sheet for Healthcare Providers:  GravelBags.it This test is not yet ap proved or cleared by the [place] FDA and  has been authorized for detection and/or diagnosis of SARS-CoV-2 by FDA under an Emergency Use Authorization (EUA). This EUA will remain  in effect (meaning this test can be used) for the duration of the COVID-19 declaration under Section 564(b)(1) of the Act, 21 U.S.C. section 360bbb-3(b)(1), unless the authorization is terminated or revoked  sooner.    Influenza A by PCR NEGATIVE NEGATIVE Final   Influenza B by PCR NEGATIVE NEGATIVE Final    Comment: (NOTE) The Xpert Xpress SARS-CoV-2/FLU/RSV assay is intended as an aid in  the diagnosis of influenza from Nasopharyngeal swab specimens and  should not be used as a sole basis for treatment. Nasal washings and  aspirates are unacceptable for Xpert Xpress SARS-CoV-2/FLU/RSV  testing. Fact Sheet for Patients: PinkCheek.be Fact Sheet for Healthcare Providers: GravelBags.it This test is not yet approved or cleared by the [place] FDA and  has been authorized for detection and/or diagnosis of SARS-CoV-2 by  FDA under an Emergency Use Authorization (EUA). This EUA will remain  in effect (meaning this test can be used) for the duration of the  Covid-19 declaration under Section 564(b)(1) of the Act, 21  U.S.C. section 360bbb-3(b)(1), unless the authorization is  terminated or revoked. Performed at [facility], [place] [address] [place], [place] [address]      Labs: BNP (last 3 results) Recent Labs    02/26/20 0008  BNP 595.6*   Basic Metabolic Panel: Recent Labs  Lab 02/21/20 2114 02/21/20 2114 02/22/20 0319 02/22/20 0319 02/22/20 1551 02/23/20 0623 02/24/20 0420 02/26/20 0007 02/27/20 0551  NA 140   < > 140   < > 140 139 136 138 138  K 4.3   < > 4.5   < > 4.6 4.5 4.1 4.0 4.1  CL 109   < > 108   < > 110 105 102 95* 98  CO2 23   < > 21*   < > 19* 17* 23 29 31   GLUCOSE 112*   < > 119*   < > 139* 119* 188* 144* 192*  BUN 13   < > 14   < > 17 18 20  23* 22*  CREATININE 0.93   < > 0.93   < > 0.98 1.05* 1.10* 1.16* 1.10*  CALCIUM 8.3*   < > 8.3*   < > 8.0* 8.6* 8.4* 9.3 8.5*  MG 3.0*  --  2.7*  --  2.3  --   --   --   --    < > = values in this interval not displayed.   Liver Function Tests: No results for input(s): AST, ALT, ALKPHOS, BILITOT, PROT, ALBUMIN in the last 168 hours. No results for  input(s): LIPASE, AMYLASE in the last 168 hours. No results for input(s): AMMONIA in the last 168 hours. CBC: Recent Labs  Lab 02/22/20 1551 02/23/20 0623 02/24/20 0420 02/26/20 0007 02/27/20 0540  WBC 8.8 9.8 8.2 9.5 9.1  NEUTROABS  --   --   --   --  4.9  HGB 11.4* 11.7* 11.4* 13.4 13.4  HCT 35.8* 37.2 35.1* 41.2 41.5  MCV 91.1 93.2 89.5 89.0 91.0  PLT PLATELET CLUMPS NOTED ON SMEAR, UNABLE TO ESTIMATE 149* 124* 247 279   Cardiac Enzymes: No results for input(s): CKTOTAL, CKMB, CKMBINDEX, TROPONINI in the last 168 hours. BNP: Invalid input(s): POCBNP CBG: Recent Labs  Lab 02/26/20 0828 02/26/20 1212 02/26/20 1709 02/26/20 2111 02/27/20 0630  GLUCAP 147* 161* 74 130* 180*   D-Dimer No results for input(s): DDIMER in the last 72 hours. Hgb A1c No results for input(s): HGBA1C in the last 72 hours. Lipid Profile No results for input(s): CHOL, HDL, LDLCALC, TRIG, CHOLHDL, LDLDIRECT in the last 72 hours. Thyroid function studies No results for input(s): TSH, T4TOTAL, T3FREE, THYROIDAB in the last 72 hours.  Invalid input(s): FREET3 Anemia work up No results for input(s): VITAMINB12, FOLATE, FERRITIN, TIBC, IRON, RETICCTPCT in the last 72 hours. Urinalysis    Component Value Date/Time   COLORURINE YELLOW 02/19/2020 2045   APPEARANCEUR CLEAR 02/19/2020 2045   LABSPEC 1.016 02/19/2020 2045   PHURINE 5.0 02/19/2020 2045   GLUCOSEU NEGATIVE 02/19/2020 2045   HGBUR NEGATIVE 02/19/2020 2045   BILIRUBINUR NEGATIVE 02/19/2020 2045   BILIRUBINUR Negative 07/25/2011 0818   KETONESUR NEGATIVE 02/19/2020 2045   PROTEINUR NEGATIVE 02/19/2020 2045   UROBILINOGEN 1.0 09/01/2015 1937   NITRITE NEGATIVE 02/19/2020 2045   LEUKOCYTESUR NEGATIVE 02/19/2020 2045   Sepsis Labs Invalid input(s): PROCALCITONIN,  WBC,  LACTICIDVEN Microbiology Recent Results (from the past 240 hour(s))  Surgical pcr screen     Status: None   Collection Time: 02/19/20  6:00 PM   Specimen: Nasal  Mucosa; Nasal Swab  Result Value Ref Range Status   MRSA, PCR NEGATIVE NEGATIVE Final   Staphylococcus aureus NEGATIVE NEGATIVE Final    Comment: (NOTE) The Xpert SA Assay (FDA approved for NASAL specimens in patients 16 years of age and older), is one component of a comprehensive surveillance program. It is not intended to diagnose infection nor to guide or monitor treatment. Performed at [facility], [address], [place], [place] [address]   Respiratory Panel by RT PCR (Flu A&B, Covid) - Nasopharyngeal Swab     Status: None   Collection Time: 02/26/20  4:04 AM   Specimen: Nasopharyngeal Swab  Result Value Ref Range Status   SARS Coronavirus 2 by RT PCR NEGATIVE NEGATIVE Final    Comment: (NOTE) SARS-CoV-2 target nucleic acids are NOT DETECTED. The SARS-CoV-2 RNA is generally detectable in upper respiratoy specimens during the acute phase of infection. The lowest concentration of SARS-CoV-2 viral copies this assay can detect is 131 copies/mL. A negative result does not preclude SARS-Cov-2 infection and should not be used as the sole basis for treatment or other patient management decisions. A negative result may occur with  improper specimen collection/handling, submission of specimen other than nasopharyngeal swab, presence of viral mutation(s) within the areas targeted by this assay, and inadequate number of viral copies (<131 copies/mL). A negative result must be combined with clinical observations, patient history, and epidemiological information. The expected result is Negative. Fact Sheet for Patients:  https://www.moore.com/ Fact Sheet for Healthcare Providers:  https://www.young.biz/ This test is not yet ap proved or cleared by the [place] FDA and  has been authorized for detection and/or diagnosis of SARS-CoV-2 by FDA under an Emergency Use Authorization (EUA). This EUA will remain  in effect (meaning this test can  be used) for the duration of the  COVID-19 declaration under Section 564(b)(1) of the Act, 21 U.S.C. section 360bbb-3(b)(1), unless the authorization is terminated or revoked sooner.    Influenza A by PCR NEGATIVE NEGATIVE Final   Influenza B by PCR NEGATIVE NEGATIVE Final    Comment: (NOTE) The Xpert Xpress SARS-CoV-2/FLU/RSV assay is intended as an aid in  the diagnosis of influenza from Nasopharyngeal swab specimens and  should not be used as a sole basis for treatment. Nasal washings and  aspirates are unacceptable for Xpert Xpress SARS-CoV-2/FLU/RSV  testing. Fact Sheet for Patients: https://www.moore.com/ Fact Sheet for Healthcare Providers: https://www.young.biz/ This test is not yet approved or cleared by the [place] FDA and  has been authorized for detection and/or diagnosis of SARS-CoV-2 by  FDA under an Emergency Use Authorization (EUA). This EUA will remain  in effect (meaning this test can be used) for the duration of the  Covid-19 declaration under Section 564(b)(1) of the Act, 21  U.S.C. section 360bbb-3(b)(1), unless the authorization is  terminated or revoked. Performed at [facility], [address] [place], [place] [address]      Time coordinating discharge: 45 minutes  SIGNED:   Coralie Keens, MD  [facility] 02/27/2020, 11:43 AM

## 2020-02-27 NOTE — Evaluation (Signed)
Occupational Therapy Evaluation Patient Details Name: Felicia [name] MRN: [id number] DOB: 12/12/1960 Today's Date: 02/27/2020    History of Present Illness 59 year old female who presented after mechanical fall and syncope complicated by left radius/ulna fracture, left orbital wall, lateral maxillary wall, inferior orbital floor fracture.  PMHx: coronary artery disease status post 5 vessel bypass February 21, 2020, T2DM, HTN and DLD She was discharged from the hospital 2 days ago.    Clinical Impression   Pt PTA: Pt living with spouse in 1 level home with near independence recovering from CABG x5 on 02/21/20. Pt had syncope episode and fell forward - with R orbital fxs and L radius/ulnar fx in sugar tong splint to PIPs. Pt currently limited by +orthostatic hypotension and R BPPV appears s/p PT performing Epley maneuver. Pt modA overall for ADL and pt using heart pillow for all bed mobility and OOB mobility. Limited eval for ADL due to "whooshy"feeling when up. Pt's RN and MD aware of BP issues. Pt reports not drinking enough fluids and reports decreased urine out put- RN aware.  Pt limited by weakness, inability to care for self, decreased strength and decreased activity tolerance. Pt would benefit from continued OT skilled services. OT following acutely.      Follow Up Recommendations  Home health OT    Equipment Recommendations  None recommended by OT    Recommendations for Other Services       Precautions / Restrictions Precautions Precautions: Fall;Sternal;Other (comment) Precaution Comments: orthostatic +; uses heart pillow Restrictions Weight Bearing Restrictions: Yes Other Position/Activity Restrictions: sternal precautions      Mobility Bed Mobility Overal bed mobility: Needs Assistance Bed Mobility: Rolling;Sidelying to Sit;Sit to Sidelying Rolling: Min guard Sidelying to sit: Min assist     Sit to sidelying: Min guard General bed mobility comments: Pt able to guide self  without use of LUE and abiding by sternal precautions by hugging heart pillow. Pt's HOB elevated  Transfers Overall transfer level: Needs assistance Equipment used: None Transfers: Sit to/from Stand Sit to Stand: Min guard;Min assist         General transfer comment: Pt ranging from minguardA to minA overall for sit to stands based on "whooshy" feeling with positional change.  Pt hugging heart pillow for transfers and mobility    Balance Overall balance assessment: Needs assistance Sitting-balance support: No upper extremity supported;Feet supported Sitting balance-Leahy Scale: Fair     Standing balance support: No upper extremity supported Standing balance-Leahy Scale: Fair Standing balance comment: Pt able to stand with minguardA for stability                           ADL either performed or assessed with clinical judgement   ADL Overall ADL's : Needs assistance/impaired Eating/Feeding: Set up;Sitting   Grooming: Set up;Sitting   Upper Body Bathing: Minimal assistance;Sitting   Lower Body Bathing: Moderate assistance;Cueing for safety;Sitting/lateral leans;Sit to/from stand   Upper Body Dressing : Minimal assistance;Sitting   Lower Body Dressing: Moderate assistance;Sitting/lateral leans;Sit to/from stand;Cueing for safety   Toilet Transfer: Min guard   Toileting- Clothing Manipulation and Hygiene: Moderate assistance;Cueing for safety;Sitting/lateral lean;Sit to/from stand Toileting - Clothing Manipulation Details (indicate cue type and reason): Pt fatigues easily     Functional mobility during ADLs: Min guard;Cueing for safety General ADL Comments: Pt requiring increased assist due to BPPV and orthostatic hypotension. (PT doing Epley maneuver)      Vision Baseline Vision/History: No visual deficits Patient  Visual Report: Other (comment)(nystagmus with R sided BPPV) Vision Assessment?: Vision impaired- to be further tested in functional  context Additional Comments: Continue to assess      Perception     Praxis      Pertinent Vitals/Pain Pain Assessment: Faces Faces Pain Scale: Hurts little more Pain Location: chest Pain Descriptors / Indicators: Discomfort Pain Intervention(s): Limited activity within patient's tolerance;Repositioned;Monitored during session     Hand Dominance Right   Extremity/Trunk Assessment Upper Extremity Assessment Upper Extremity Assessment: Generalized weakness;RUE deficits/detail;LUE deficits/detail RUE Deficits / Details: AROm, WFLs LUE Deficits / Details: LUE ulna/radius fx in sugar tong splint to PIPs able to wiggle digits and lift LUE up slightly. LUE Coordination: decreased fine motor;decreased gross motor   Lower Extremity Assessment Lower Extremity Assessment: Generalized weakness   Cervical / Trunk Assessment Cervical / Trunk Assessment: Normal   Communication Communication Communication: No difficulties;HOH   Cognition Arousal/Alertness: Awake/alert Behavior During Therapy: WFL for tasks assessed/performed Overall Cognitive Status: Within Functional Limits for tasks assessed                                 General Comments: Pt reporting positional changes with a "whooshing feeling" rather than dizziness or light headedness.    General Comments  +orthostatics; 85/54 lying, 85/64 sitting, 64/57 standing, standing at 3 mins 64/57. sitting 86/58 88 BPM"wooshy feel" RN aware and cardiologist MD aware    Exercises     Shoulder Instructions      Home Living Family/patient expects to be discharged to:: Private residence Living Arrangements: Spouse/significant other Available Help at Discharge: Family Type of Home: House Home Access: Level entry     Home Layout: Two level;Able to live on main level with bedroom/bathroom     Bathroom Shower/Tub: Producer, television/film/video: Standard     Home Equipment: Bedside commode          Prior  Functioning/Environment Level of Independence: Independent        Comments: Up until fall, pt was restarting ADL routine and spouse is very supportive of her. Pt reports that she was performing her own ADL.        OT Problem List: Decreased strength;Decreased activity tolerance;Impaired balance (sitting and/or standing);Impaired vision/perception;Decreased coordination;Decreased cognition;Decreased safety awareness;Impaired UE functional use;Pain;Cardiopulmonary status limiting activity      OT Treatment/Interventions: Self-care/ADL training;Therapeutic exercise;Energy conservation;DME and/or AE instruction;Therapeutic activities;Visual/perceptual remediation/compensation;Patient/family education;Balance training    OT Goals(Current goals can be found in the care plan section) Acute Rehab OT Goals Patient Stated Goal: to get rid of this whooshy feeling and fear of falling OT Goal Formulation: With patient Time For Goal Achievement: 03/12/20 Potential to Achieve Goals: Fair ADL Goals Pt Will Perform Grooming: with supervision;standing Pt Will Perform Lower Body Dressing: with supervision;sitting/lateral leans;sit to/from stand Pt Will Transfer to Toilet: with supervision;ambulating;regular height toilet Pt Will Perform Toileting - Clothing Manipulation and hygiene: with min guard assist;sitting/lateral leans;sit to/from stand Additional ADL Goal #1: Pt will complete 10 mins of ADL tasks with 2 seated rest breaks in order to increase activity tolerance.  OT Frequency: Min 2X/week   Barriers to D/C:            Co-evaluation PT/OT/SLP Co-Evaluation/Treatment: Yes Reason for Co-Treatment: For patient/therapist safety          AM-PAC OT "6 Clicks" Daily Activity     Outcome Measure Help from another person eating meals?: A Little Help from  another person taking care of personal grooming?: A Little Help from another person toileting, which includes using toliet, bedpan, or  urinal?: A Lot Help from another person bathing (including washing, rinsing, drying)?: A Lot Help from another person to put on and taking off regular upper body clothing?: A Lot Help from another person to put on and taking off regular lower body clothing?: A Lot 6 Click Score: 14   End of Session Nurse Communication: Mobility status  Activity Tolerance: Treatment limited secondary to medical complications (Comment) Patient left: in bed;with call bell/phone within reach;with family/visitor present;with nursing/sitter in room  OT Visit Diagnosis: Unsteadiness on feet (R26.81);Muscle weakness (generalized) (M62.81);Dizziness and giddiness (R42);BPPV BPPV - Right/Left: Right                Time: 1330-1402 OT Time Calculation (min): 32 min Charges:  OT General Charges $OT Visit: 1 Visit OT Evaluation $OT Eval Moderate Complexity: 1 Mod  Jefferey Pica, OTR/L Acute Rehabilitation Services Pager: [phone] Office: [phone]   Jefferey Pica 02/27/2020, 3:44 PM

## 2020-02-27 NOTE — Progress Notes (Signed)
Progress Note  Patient Name: Felicia [name] Date of Encounter: 02/27/2020  Primary Cardiologist: Julien Nordmann, MD   Subjective   No problems overnight.  Had transient lightheadedness when she tried to stand up this morning, improved after a while. High-sensitivity showed a slight bump, but the echocardiogram shows hyperdynamic left ventricular systolic function with EF 65-70% and no regional wall motion abnormalities.  No pericardial effusion.  Inpatient Medications    Scheduled Meds: . aspirin  81 mg Oral Daily  . ezetimibe  10 mg Oral Daily  . insulin aspart  0-15 Units Subcutaneous TID WC  . insulin glargine  10 Units Subcutaneous Daily   And  . insulin glargine  8 Units Subcutaneous QHS  . multivitamin with minerals  1 tablet Oral Daily  . pravastatin  60 mg Oral Daily  . sodium chloride flush  10-40 mL Intracatheter Q12H   Continuous Infusions:  PRN Meds: acetaminophen **OR** acetaminophen, sodium chloride flush   Vital Signs    Vitals:   02/27/20 0030 02/27/20 0448 02/27/20 0744 02/27/20 0815  BP: (!) 99/50 123/65 (!) 87/50 (!) 128/111  Pulse: 81 81 77 78  Resp: 14 12 20 16   Temp: 98.8 F (37.1 C) 98.3 F (36.8 C) 98.5 F (36.9 C)   TempSrc: Oral Oral Oral   SpO2: 95% 97% 91% 93%  Weight:  61.9 kg    Height:        Intake/Output Summary (Last 24 hours) at 02/27/2020 1002 Last data filed at 02/27/2020 0500 Gross per 24 hour  Intake --  Output 200 ml  Net -200 ml   Last 3 Weights 02/27/2020 02/26/2020 02/24/2020  Weight (lbs) 136 lb 7.4 oz 134 lb 14.7 oz 138 lb 3.7 oz  Weight (kg) 61.9 kg 61.2 kg 62.7 kg      Telemetry    NSR - Personally Reviewed  ECG    NSR - Personally Reviewed  Physical Exam  Appears well. GEN: No acute distress.   Neck: No JVD Cardiac: RRR, no murmurs, rubs, or gallops.  Respiratory: Clear to auscultation bilaterally. GI: Soft, nontender, non-distended  MS: No edema; No deformity. Neuro:  Nonfocal  Psych: Normal  affect   Labs    High Sensitivity Troponin:   Recent Labs  Lab 02/26/20 0404 02/26/20 0715 02/26/20 1214 02/26/20 1706 02/26/20 1957  TROPONINIHS 3,850* 2,678* 2,865* 4,160* 5,926*      Chemistry Recent Labs  Lab 02/24/20 0420 02/26/20 0007 02/27/20 0551  NA 136 138 138  K 4.1 4.0 4.1  CL 102 95* 98  CO2 23 29 31   GLUCOSE 188* 144* 192*  BUN 20 23* 22*  CREATININE 1.10* 1.16* 1.10*  CALCIUM 8.4* 9.3 8.5*  GFRNONAA 56* 52* 56*  GFRAA >60 >60 >60  ANIONGAP 11 14 9      Hematology Recent Labs  Lab 02/24/20 0420 02/26/20 0007 02/27/20 0540  WBC 8.2 9.5 9.1  RBC 3.92 4.63 4.56  HGB 11.4* 13.4 13.4  HCT 35.1* 41.2 41.5  MCV 89.5 89.0 91.0  MCH 29.1 28.9 29.4  MCHC 32.5 32.5 32.3  RDW 13.7 13.3 13.4  PLT 124* 247 279    BNP Recent Labs  Lab 02/26/20 0008  BNP 598.5*     DDimer No results for input(s): DDIMER in the last 168 hours.   Radiology    DG Wrist Complete Left  Result Date: 02/25/2020 CLINICAL DATA:  Fall EXAM: LEFT WRIST - COMPLETE 3+ VIEW COMPARISON:  None. FINDINGS: There is a slightly  impacted nondisplaced distal radius fracture seen. A mildly displaced ulnar styloid fracture is noted. Diffuse overlying soft tissue swelling is seen. There is diffuse osteopenia noted. IMPRESSION: Slightly impacted nondisplaced distal radius fracture. Mildly displaced ulnar styloid fracture. Electronically Signed   By: Jonna Clark M.D.   On: 02/25/2020 23:40   CT HEAD WO CONTRAST  Result Date: 02/26/2020 CLINICAL DATA:  Headache, posttraumatic EXAM: CT HEAD WITHOUT CONTRAST TECHNIQUE: Contiguous axial images were obtained from the base of the skull through the vertex without intravenous contrast. COMPARISON:  None. FINDINGS: Brain: No evidence of acute territorial infarction, hemorrhage, hydrocephalus,extra-axial collection or mass lesion/mass effect. Normal gray-white differentiation. Ventricles are normal in size and contour. Vascular: No hyperdense vessel or  unexpected calcification. Skull: The skull is intact. There is a mildly displaced obliquely oriented fracture seen through the lateral orbital wall. There is a comminuted mildly impacted fracture seen through the lateral maxillary wall and inferior orbital floor extends through the infraorbital foramen. No displacement of the inferior orbital fat. There is small foci of air seen within the inferior orbit soft tissues. Sinuses/Orbits: The visualized paranasal sinuses and mastoid air cells are clear. The orbits and globes intact. Other: Right periorbital soft tissue swelling is seen. There is also soft tissue swelling seen over the right frontal skull. The orbits however appear to be intact. No retro-orbital fluid collections are noted. IMPRESSION: No acute intracranial abnormality. Nondisplaced comminuted fractures through the lateral orbital wall, lateral maxillary wall common inferior orbital floor. No displacement of the inferior orbital fat. Soft tissue swelling seen over the right frontal skull and right periorbital soft tissues. Electronically Signed   By: Jonna Clark M.D.   On: 02/26/2020 01:03   DG Chest Port 1 View  Result Date: 02/25/2020 CLINICAL DATA:  Larey Seat, syncope, recent CABG EXAM: PORTABLE CHEST 1 VIEW COMPARISON:  02/23/2020 FINDINGS: Single frontal view of the chest demonstrates stable postsurgical changes from bypass surgery. The cardiac silhouette is stable. Improved volume status, with resolution of airspace disease and effusions seen previously. Minimal scarring or atelectasis at the left base. No pneumothorax. No acute bony abnormalities. IMPRESSION: 1. Minimal left basilar atelectasis. 2. No acute intrathoracic process. Electronically Signed   By: Sharlet [name] M.D.   On: 02/25/2020 23:41    Cardiac Studies  Echocardiogram 02/17/2020 1. Left ventricular ejection fraction, by estimation, is 65 to 70%. The  left ventricle has normal function. The left ventricle has no regional    wall motion abnormalities. Left ventricular diastolic parameters are  consistent with Grade I diastolic  dysfunction (impaired relaxation).  2. Right ventricular systolic function is normal. The right ventricular  size is normal.  3. The mitral valve is normal in structure. Mild mitral valve  regurgitation. No evidence of mitral stenosis.  4. The aortic valve is normal in structure. Aortic valve regurgitation is  not visualized. Mild to moderate aortic valve sclerosis/calcification is  present, without any evidence of aortic stenosis.  5. The inferior vena cava is normal in size with greater than 50%  respiratory variability, suggesting right atrial pressure of 3 mmHg.   Patient Profile     59 y.o. female day 6 status post multivessel CABG, readmitted after syncopal event at home with fall complicated by nondisplaced fractures of the right orbit and left wrist.  Assessment & Plan    Blood pressure remains borderline low and she has symptoms of orthostatic hypotension. No rhythm abnormalities on overnight telemetry Have stopped beta-blockers and ACE inhibitors. History is most consistent  with a vasovagal/orthostatic hypotension mechanism of her syncope, but the abnormal cardiac enzymes in the immediate proximity to CABG surgery raise some concern about possible arrhythmic event. Slight increase in cardiac high-sensitivity troponin noted, echocardiogram pending. If there are regional wall motion abnormalities or a marked decrease in LVEF on echo, will plan further evaluation in-house. If echocardiogram shows normal findings, I would recommend discharge home with a 2-week event monitor (we will order this).  She has appropriate follow-up scheduled already. Encouraged intake of fluids and a liberal salt diet.  Will not resume her usual ACE inhibitor (prescribed for renal protection with diabetes mellitus) until to 3 months after surgery when her hemodynamics have stabilized.     For  questions or updates, please contact [place] Please consult [email or web address] for contact info under        Signed, Sanda Klein, MD  02/27/2020, 10:02 AM

## 2020-02-28 ENCOUNTER — Telehealth: Payer: Self-pay | Admitting: *Deleted

## 2020-02-28 DIAGNOSIS — R55 Syncope and collapse: Secondary | ICD-10-CM | POA: Diagnosis not present

## 2020-02-28 DIAGNOSIS — I2510 Atherosclerotic heart disease of native coronary artery without angina pectoris: Secondary | ICD-10-CM | POA: Diagnosis not present

## 2020-02-28 LAB — GLUCOSE, CAPILLARY
Glucose-Capillary: 144 mg/dL — ABNORMAL HIGH (ref 70–99)
Glucose-Capillary: 159 mg/dL — ABNORMAL HIGH (ref 70–99)
Glucose-Capillary: 182 mg/dL — ABNORMAL HIGH (ref 70–99)
Glucose-Capillary: 187 mg/dL — ABNORMAL HIGH (ref 70–99)

## 2020-02-28 MED ORDER — SODIUM CHLORIDE 0.9 % IV BOLUS
500.0000 mL | Freq: Once | INTRAVENOUS | Status: AC
  Administered 2020-02-28: 500 mL via INTRAVENOUS

## 2020-02-28 MED ORDER — ENOXAPARIN SODIUM 40 MG/0.4ML ~~LOC~~ SOLN
40.0000 mg | SUBCUTANEOUS | Status: DC
  Administered 2020-02-29: 40 mg via SUBCUTANEOUS
  Filled 2020-02-28: qty 0.4

## 2020-02-28 MED ORDER — PRAVASTATIN SODIUM 40 MG PO TABS
40.0000 mg | ORAL_TABLET | Freq: Every day | ORAL | Status: DC
  Administered 2020-02-28 – 2020-02-29 (×2): 40 mg via ORAL
  Filled 2020-02-28: qty 1

## 2020-02-28 NOTE — Progress Notes (Signed)
Orthopedic Tech Progress Note Patient Details:  Felicia [name] 1961/04/30 [id number] Spoke with MD this morning and he wanted me to go and check on patient's arm but her splint was a little to long and was causing discomfort. After changing splint she was feeling much better  Ortho Devices Type of Ortho Device: Sugartong splint Ortho Device/Splint Location: LUE Ortho Device/Splint Interventions: Removal, Ordered, Application   Post Interventions Patient Tolerated: Well Instructions Provided: Care of device   Donald Pore 02/28/2020, 11:58 AM

## 2020-02-28 NOTE — Telephone Encounter (Signed)
I called and spoke with the patient. She is still currently admitted to [facility]. She thinks if today goes well, they should let her go home tomorrow.  I have advised her I am going to go ahead and order a ZIO AT (real time) monitor to be shipped to her home address.   I have reviewed the information below with her: Your physician has recommended that you wear a 14  day heart monitor (ZIO patch) - This will be mailed directly to your home address - You may receive a call directly from the company for Zio, which is [employer] within the next few day- if you see an 800# or 224# calling, please answer - Once the monitor is applied and activated it will record your heart rate/ rhythm for the duration that you are wearing it - If you are having any symptoms (dizziness/ lightheadedness/ palpitations/ racing heart/ anything that just doesn't feel right) then you will push the button in the center of the monitor to mark you were having a symptom - Please DO NOT shower for 24 hours after the monitor is placed - No tub baths/ swimming pools/ hot tubs while wearing the monitor - Do not put any lotions, oils, or ointments around the monitor   I have confirmed her mailing address as well as her emergency contact information. The patient is agreeable with wearing the monitor and voices understanding of the above information.  I have advised if she any questions/ concerns about placing the monitor once she receives this, to please call and let us know/ we can bring her in to place this if needed.   Confirmed the order for the monitor has already been placed by Ward Givens, NP.

## 2020-02-28 NOTE — Telephone Encounter (Signed)
Felicia [name] calling Felicia [name] is still at [facility] - states that she spoke with another provider and he mentioned doing another surgery Felicia [name] is confused and worried and would like to discuss with Dr Mariah Milling Felicia [name] requesting to speak with Dr Mariah Milling only but explained that a nurse may call first  Please call to discuss

## 2020-02-28 NOTE — Progress Notes (Signed)
Progress Note  Patient Name: Felicia [name] Date of Encounter: 02/28/2020  Primary Cardiologist: Julien Nordmann, MD   Subjective   59 yo with recent CAD, CABG, longstanding DM   Admitted with symcope and a new ant. Apical wall motion defect.    Inpatient Medications    Scheduled Meds: . aspirin  81 mg Oral Daily  . enoxaparin (LOVENOX) injection  40 mg Subcutaneous Q24H  . ezetimibe  10 mg Oral Daily  . insulin aspart  0-15 Units Subcutaneous TID WC  . insulin glargine  10 Units Subcutaneous Daily   And  . insulin glargine  8 Units Subcutaneous QHS  . multivitamin with minerals  1 tablet Oral Daily  . pravastatin  60 mg Oral Daily  . sodium chloride flush  10-40 mL Intracatheter Q12H   Continuous Infusions: . sodium chloride 75 mL/hr at 02/27/20 2252   PRN Meds: acetaminophen **OR** acetaminophen, sodium chloride flush   Vital Signs    Vitals:   02/28/20 0441 02/28/20 0742 02/28/20 0743 02/28/20 1237  BP: (!) 88/53 (!) 117/94 (!) 118/94 113/67  Pulse: 76 76 80 77  Resp: 15  16 18   Temp: 98.3 F (36.8 C) 98.6 F (37 C) 98.3 F (36.8 C) 98.5 F (36.9 C)  TempSrc: Oral Oral Oral Oral  SpO2: 93% 96%  98%  Weight: 62.9 kg     Height:        Intake/Output Summary (Last 24 hours) at 02/28/2020 1353 Last data filed at 02/28/2020 1231 Gross per 24 hour  Intake 2005.68 ml  Output 951 ml  Net 1054.68 ml   Last 3 Weights 02/28/2020 02/27/2020 02/26/2020  Weight (lbs) 138 lb 10.7 oz 136 lb 7.4 oz 134 lb 14.7 oz  Weight (kg) 62.9 kg 61.9 kg 61.2 kg      Telemetry    NSR  - Personally Reviewed  ECG     NSR  - Personally Reviewed  Physical Exam   GEN: No acute distress.   Neck: No JVD Cardiac: RRR, no murmurs, rubs, or gallops.   Sternotomy scar is well-healed.  There is no extensive bruising of the soft tissue.  There is no significant rib tenderness or sternal tenderness.  The sternum appears to be intact. Respiratory: Clear to auscultation  bilaterally. GI: Soft, nontender, non-distended  MS: No edema; No deformity. Neuro:  Nonfocal  Psych: Normal affect   Labs    High Sensitivity Troponin:   Recent Labs  Lab 02/26/20 0404 02/26/20 0715 02/26/20 1214 02/26/20 1706 02/26/20 1957  TROPONINIHS 3,850* 2,678* 2,865* 4,160* 5,926*      Chemistry Recent Labs  Lab 02/24/20 0420 02/26/20 0007 02/27/20 0551  NA 136 138 138  K 4.1 4.0 4.1  CL 102 95* 98  CO2 23 29 31   GLUCOSE 188* 144* 192*  BUN 20 23* 22*  CREATININE 1.10* 1.16* 1.10*  CALCIUM 8.4* 9.3 8.5*  GFRNONAA 56* 52* 56*  GFRAA >60 >60 >60  ANIONGAP 11 14 9      Hematology Recent Labs  Lab 02/24/20 0420 02/26/20 0007 02/27/20 0540  WBC 8.2 9.5 9.1  RBC 3.92 4.63 4.56  HGB 11.4* 13.4 13.4  HCT 35.1* 41.2 41.5  MCV 89.5 89.0 91.0  MCH 29.1 28.9 29.4  MCHC 32.5 32.5 32.3  RDW 13.7 13.3 13.4  PLT 124* 247 279    BNP Recent Labs  Lab 02/26/20 0008  BNP 598.5*     DDimer No results for input(s): DDIMER in the last 168  hours.   Radiology    ECHOCARDIOGRAM LIMITED  Result Date: 02/27/2020    ECHOCARDIOGRAM LIMITED REPORT   Patient Name:   Felicia [name] Date of Exam: 02/27/2020 Medical Rec #:  [id number]      Height:       64.0 in Accession #:    [id number]     Weight:       136.5 lb Date of Birth:  1961/09/11      BSA:          1.663 m Patient Age:    59 years       BP:           128/111 mmHg Patient Gender: F              HR:           80 bpm. Exam Location:  Inpatient Procedure: Limited Echo Indications:    Elevated troponin  History:        Patient has prior history of Echocardiogram examinations, most                 recent 02/17/2020. CAD, Prior CABG, Signs/Symptoms:Syncope and                 Chest Pain; Risk Factors:Hypertension, Diabetes, Dyslipidemia                 and Non-Smoker.  Sonographer:    Renella Cunas RDCS Referring Phys: [id number] Lynda Rainwater A ACHARYA IMPRESSIONS  1. Left ventricular ejection fraction, by estimation, is 55 to 60%. The  left ventricle has normal function. The left ventricle demonstrates regional wall motion abnormalities (see scoring diagram/findings for description). Left ventricular diastolic function could not be evaluated. There is moderate hypokinesis of the left ventricular, apical anterior wall, lateral wall and apical segment.  2. Right ventricular systolic function is normal. The right ventricular size is normal.  3. The mitral valve is normal in structure. No evidence of mitral stenosis.  4. The aortic valve is normal in structure. Aortic valve regurgitation is not visualized. No aortic stenosis is present.  5. The inferior vena cava is normal in size with greater than 50% respiratory variability, suggesting right atrial pressure of 3 mmHg. Comparison(s): Prior images reviewed side by side. Changes from prior study are noted. The left ventricular wall motion abnormality is new, but overall left ventricular function remains normal. FINDINGS  Left Ventricle: Left ventricular ejection fraction, by estimation, is 55 to 60%. The left ventricle has normal function. The left ventricle demonstrates regional wall motion abnormalities. Moderate hypokinesis of the left ventricular, apical anterior wall, lateral wall and apical segment. The left ventricular internal cavity size was normal in size. There is no left ventricular hypertrophy.  LV Wall Scoring: The apical lateral segment, apical anterior segment, and apex are hypokinetic. Right Ventricle: The right ventricular size is normal. No increase in right ventricular wall thickness. Right ventricular systolic function is normal. Left Atrium: Left atrial size was normal in size. Right Atrium: Right atrial size was normal in size. Pericardium: There is no evidence of pericardial effusion. Mitral Valve: The mitral valve is normal in structure. Normal mobility of the mitral valve leaflets. No evidence of mitral valve stenosis. Tricuspid Valve: The tricuspid valve is normal in  structure. No evidence of tricuspid stenosis. Aortic Valve: The aortic valve is normal in structure. Aortic valve regurgitation is not visualized. No aortic stenosis is present. Pulmonic Valve: The pulmonic valve was grossly normal. No evidence of pulmonic stenosis.  Aorta: The aortic root and ascending aorta are structurally normal, with no evidence of dilitation. Venous: The inferior vena cava is normal in size with greater than 50% respiratory variability, suggesting right atrial pressure of 3 mmHg. IAS/Shunts: The interatrial septum was not assessed. Sanda Klein MD Electronically signed by Sanda Klein MD Signature Date/Time: 02/27/2020/11:47:43 AM    Final     Cardiac Studies     Patient Profile     59 y.o. female with recent coronary artery bypass grafting.  She recently had an episode of syncope.  Her echocardiogram now shows a new anterior apical defect.  Assessment & Plan    1.  Coronary artery disease: The patient has had diabetes for 47 years.  She recently had coronary artery bypass grafting approximately 9 days ago. She was discharged home and then returned after having a syncopal episode.  Her troponin levels are elevated.  There is no significant soft tissue injury.  There is no sternal nonunion.  There is no significant rib tenderness.  I do not think that there is enough chest wall injury to explain this wall motion defect being due to cardiac contusion.  I suspect that she had ischemia and an likely infarction of her apical LAD.  I reviewed the 2 echocardiograms from this admission in the last admission in the coronary angiograms from April 15. She had very small diabetic vessels in the apical LAD and is quite likely that she became ischemic in this region.  I would like to do a heart catheterization to rule out the possibility of a dissection at the insertion point of the LIMA to LAD.  Patient became completely overwhelmed by our discussion and stated that this was a  change of plan from what she had discussed over the weekend.  She would like some time to think about it and talk with her husband. She is also requested that Dr. Sallyanne Kuster come by and discuss with her.   I will put her on for cath for tomorrow.  The afternoon is as soon as we can schedule it.    2. DM:   Plans per IM    For questions or updates, please contact [place] Please consult [email or web address] for contact info under        Signed, Mertie Moores, MD  02/28/2020, 1:53 PM

## 2020-02-28 NOTE — Progress Notes (Signed)
Patient refuses 60mg  dose of pravastatin. She states that she takes 40mg  instead. Provider notified.

## 2020-02-28 NOTE — Telephone Encounter (Signed)
ZIO AT monitor ordered for home enrollment in [employer].

## 2020-02-28 NOTE — Telephone Encounter (Signed)
-----   Message from Loman Chroman sent at 02/28/2020 11:28 AM EDT ----- Good morning,  Ms. Felicia [name] is going home from [facility] today (Sunday).  She needs a xio AT.  I'm placing order.  Would you pls arrange for pick-up/mailing?  She shouldn't wait until 5/5 appt w/ Caitlin to get it.  Thanks,  [name]

## 2020-02-28 NOTE — Progress Notes (Signed)
Patient expresses concern for course of treatment. Patient and family will not consent to catheterization until a conversation with Dr. Royann Shivers occurs, which has not yet happened as of this time. Patient expresses desire to speak to a cardiologist tomorrow with family present regarding plan of care.

## 2020-02-28 NOTE — Progress Notes (Signed)
PROGRESS NOTE    Felicia [name]  SAY:[id number] DOB: 10-31-1961 DOA: 02/25/2020 PCP: Ria Bush, MD    Brief Narrative:  Patient was admitted to the hospital with a working diagnosis of syncope complicated with left distal radius and ulna fractures, nondisplaced orbital and maxillary fractures, in the setting ofelevated troponin  59 year old female who presented after mechanical fall and syncope. She does have significant past medical history for coronary artery disease status post 5 vessel bypass February 21, 2020, she has type 2 diabetes mellitus, hypertension, and dyslipidemia. She was discharged from the hospital 2 days ago.Apparently patient was sitting watching TV, thenshe stood up to go to the bathroomwith the intention tomove her bowels, while walking she had a syncope episode with prodromal dizziness.Post trauma she had facial and left wrist pain.  On her initial physical examination her blood pressure was 135/75, heart rate 85, respiratory rate 26, oxygen saturation 94%, her lungs are clear to auscultation bilaterally, heart S1-S2 present rhythm, abdomen was soft, no lower extremity edema, she had a left wrist splint in place. Sodium 138, potassium 4.1, chloride 95, bicarb 29, glucose 144, BUN 23, creatinine 1.16, troponin I 4,550,white count 9.5, hemoglobin 13.4, hematocrit 41.2, platelets 247.Head CT no acute changes. EKG 84 bpm, normal axis, normal intervals, sinus rhythm, no ST segment or T wave changes.  Patient was admitted to the progressive care unit, telemetry with no arrhythmias.  Her troponins remained elevated but not in a acute coronary syndrome pattern.   Patient with severe orthostatic hypotension, her discharge for 04/25 was cancelled and patient was placed on IV fluids.    Assessment & Plan:   Principal Problem:   Syncope Active Problems:   S/P CABG (coronary artery bypass graft)   CAD (coronary artery disease)   Radius and ulna distal  fracture   Orbital fracture (HCC)   1.  Syncope, complicated by left radius/ulna fracture, left orbital wall, lateral maxillary wall, inferior orbital floor fracture.  patient continue to be orthostatic this am, but decrease intensity, her symptoms have improved but not yet back to baseline. Telemetry with sinus rhythm. Limited echocardiogram with preserved LV systolic function, new LV apical anterior, lateral wall and apical segment hypokinesis.   Supine: 118/94 HR 80 Sitting   113/72  HR 72 Standing 84/54  HR 84  Will continue to hold on cardiac agents, ace inh and b blockade. Will continue gentle hydration with isotonic saline and will continue orthostatic monitoring. Continue telemetry monitoring.   Continue pain control and immobilization to left upper extremity.   2.  Coronary status post CABG, elevated troponins.  pattern.  Continue aspirin and statin, and continue holding beta-blockade and ACE inhibitor due to risk of worsening orthostatic hypotension.  Patient ruled out for acute coronary syndrome.   3.  Uncontrolled type 1 diabetes mellitus/dyslipidemia.  Capillary glucose this am 144, will continue pravastatin and ezetimibe.  4.  Hypertension. Continue to hold on antihypertensive agents.     DVT prophylaxis: Enoxaparin   Code Status:   full  Family Communication:  I spoke with patient's mother at the bedside, we talked in detail about patient's condition, plan of care and prognosis and all questions were addressed.  Disposition Plan:   Patient is from:  Home   Anticipated DC to:  Home   Anticipated DC date:  04/27  Anticipated DC barriers: Patient continue to have orthostatic hypotension and risk for recurrent syncope.      Consultants:   Cardiology  Subjective: Patient is feeling better but not yet back to baseline, continue to have orthostatic symptoms, no nausea or vomiting, no chest pain,   Objective: Vitals:   02/28/20 0025 02/28/20 0441 02/28/20  0742 02/28/20 0743  BP: (!) 93/51 (!) 88/53 (!) 117/94 (!) 118/94  Pulse: 78 76 76 80  Resp: 16 15  16   Temp: 98.2 F (36.8 C) 98.3 F (36.8 C) 98.6 F (37 C) 98.3 F (36.8 C)  TempSrc: Oral Oral Oral Oral  SpO2: 93% 93% 96%   Weight:  62.9 kg    Height:        Intake/Output Summary (Last 24 hours) at 02/28/2020 1008 Last data filed at 02/28/2020 0512 Gross per 24 hour  Intake 631.16 ml  Output 1002 ml  Net -370.84 ml   Filed Weights   02/26/20 1816 02/27/20 0448 02/28/20 0441  Weight: 61.2 kg 61.9 kg 62.9 kg    Examination:   General: Not in pain or dyspnea, deconditioned  Neurology: Awake and alert, non focal  E ENT: mild pallor, no icterus, oral mucosa moist Cardiovascular: No JVD. S1-S2 present, rhythmic, no gallops, rubs, or murmurs. No lower extremity edema. Pulmonary: positive breath sounds bilaterally, adequate air movement, no wheezing, rhonchi or rales. Gastrointestinal. Abdomen with, no organomegaly, non tender, no rebound or guarding Skin. No rashes Musculoskeletal: no joint deformities     Data Reviewed: I have personally reviewed following labs and imaging studies  CBC: Recent Labs  Lab 02/22/20 1551 02/23/20 0623 02/24/20 0420 02/26/20 0007 02/27/20 0540  WBC 8.8 9.8 8.2 9.5 9.1  NEUTROABS  --   --   --   --  4.9  HGB 11.4* 11.7* 11.4* 13.4 13.4  HCT 35.8* 37.2 35.1* 41.2 41.5  MCV 91.1 93.2 89.5 89.0 91.0  PLT PLATELET CLUMPS NOTED ON SMEAR, UNABLE TO ESTIMATE 149* 124* 247 279   Basic Metabolic Panel: Recent Labs  Lab 02/21/20 2114 02/21/20 2114 02/22/20 0319 02/22/20 0319 02/22/20 1551 02/23/20 0623 02/24/20 0420 02/26/20 0007 02/27/20 0551  NA 140   < > 140   < > 140 139 136 138 138  K 4.3   < > 4.5   < > 4.6 4.5 4.1 4.0 4.1  CL 109   < > 108   < > 110 105 102 95* 98  CO2 23   < > 21*   < > 19* 17* 23 29 31   GLUCOSE 112*   < > 119*   < > 139* 119* 188* 144* 192*  BUN 13   < > 14   < > 17 18 20  23* 22*  CREATININE 0.93   < >  0.93   < > 0.98 1.05* 1.10* 1.16* 1.10*  CALCIUM 8.3*   < > 8.3*   < > 8.0* 8.6* 8.4* 9.3 8.5*  MG 3.0*  --  2.7*  --  2.3  --   --   --   --    < > = values in this interval not displayed.   GFR: Estimated Creatinine Clearance: 48.7 mL/min (A) (by C-G formula based on SCr of 1.1 mg/dL (H)). Liver Function Tests: No results for input(s): AST, ALT, ALKPHOS, BILITOT, PROT, ALBUMIN in the last 168 hours. No results for input(s): LIPASE, AMYLASE in the last 168 hours. No results for input(s): AMMONIA in the last 168 hours. Coagulation Profile: Recent Labs  Lab 02/21/20 1424  INR 1.5*   Cardiac Enzymes: No results for input(s): CKTOTAL, CKMB, CKMBINDEX, TROPONINI in  the last 168 hours. BNP (last 3 results) No results for input(s): PROBNP in the last 8760 hours. HbA1C: No results for input(s): HGBA1C in the last 72 hours. CBG: Recent Labs  Lab 02/27/20 0630 02/27/20 1151 02/27/20 1745 02/27/20 2109 02/28/20 0614  GLUCAP 180* 124* 179* 141* 144*   Lipid Profile: No results for input(s): CHOL, HDL, LDLCALC, TRIG, CHOLHDL, LDLDIRECT in the last 72 hours. Thyroid Function Tests: No results for input(s): TSH, T4TOTAL, FREET4, T3FREE, THYROIDAB in the last 72 hours. Anemia Panel: No results for input(s): VITAMINB12, FOLATE, FERRITIN, TIBC, IRON, RETICCTPCT in the last 72 hours.    Radiology Studies: I have reviewed all of the imaging during this hospital visit personally     Scheduled Meds: . aspirin  81 mg Oral Daily  . ezetimibe  10 mg Oral Daily  . insulin aspart  0-15 Units Subcutaneous TID WC  . insulin glargine  10 Units Subcutaneous Daily   And  . insulin glargine  8 Units Subcutaneous QHS  . multivitamin with minerals  1 tablet Oral Daily  . pravastatin  60 mg Oral Daily  . sodium chloride flush  10-40 mL Intracatheter Q12H   Continuous Infusions: . sodium chloride 75 mL/hr at 02/27/20 2252     LOS: 2 days        Aruna Nestler Annett Gula, MD

## 2020-02-28 NOTE — Telephone Encounter (Addendum)
Spoke with patient and she states that she is back in the hospital. She passed out, fell, and broke her orbital bones and left wrist. She states that Dr. Salena Saner was going to send her home on monitor and then have her come back in for sonogram to see if heart was just bruised. Today she states that Dr. Melburn Popper came in to see her and is wanting to do a heart cath tomorrow. He did state that this had nothing to do with the fall. She is now confused and not sure what to do. She states being told there is not enough blood flow to the bottom of her heart. She states that she had 5 bypasses, heart cath, and now they want to do another heart cath. She is not clear why they are wanting to do another one and begging to talk with Dr. Mariah Milling who is someone she trusts and knows will give her correct guidance.

## 2020-02-28 NOTE — Progress Notes (Addendum)
Blood pressure is confirmed at 88/53. Patient denies symptoms and stood and pivoted to bedside commode without incident. Provider notified. No new orders at this time.  Addendum: new order received for 0.9% normal saline bolus (see MAR).

## 2020-02-28 NOTE — Progress Notes (Signed)
Physical Therapy Treatment Patient Details Name: Felicia [name] MRN: [id number] DOB: 1961-08-03 Today's Date: 02/28/2020    History of Present Illness 59 year old female who presented after mechanical fall and syncope complicated by left radius/ulna fracture, left orbital wall, lateral maxillary wall, inferior orbital floor fracture.  PMHx: coronary artery disease status post 5 vessel bypass February 21, 2020, T2DM, HTN and DLD She was discharged from the hospital 2 days ago.     PT Comments    Pt admitted with above diagnosis. Pt was able to tolerate canalith repositioning for right BPPV again.  Pt reported no symptoms after the repositioning.  Pt then able to ambulate with good stability to the chair.  Pt states she felt much better overall.   Pt currently with functional limitations due to balance and endurance deficits. Pt will benefit from skilled PT to increase their independence and safety with mobility to allow discharge to the venue listed below.     Follow Up Recommendations  Home health PT(vestibular)     Equipment Recommendations  None recommended by PT    Recommendations for Other Services       Precautions / Restrictions Precautions Precautions: Fall;Sternal Precaution Comments:  uses heart pillow, splint on L UE Restrictions Weight Bearing Restrictions: Yes LUE Weight Bearing: Non weight bearing Other Position/Activity Restrictions: sternal precautions    Mobility  Bed Mobility Overal bed mobility: Needs Assistance Bed Mobility: Rolling;Sidelying to Sit Rolling: Min guard Sidelying to sit: Min assist       General bed mobility comments: Pt able to guide self without use of LUE and abiding by sternal precautions by hugging heart pillow.   Transfers Overall transfer level: Needs assistance Equipment used: None Transfers: Sit to/from Stand Sit to Stand: Min guard         General transfer comment: Pt  minguardA  sit to stand.  Pt hugging heart pillow for  transfers and mobility  Ambulation/Gait Ambulation/Gait assistance: Min guard Gait Distance (Feet): 25 Feet Assistive device: None Gait Pattern/deviations: Step-through pattern;Decreased stride length   Gait velocity interpretation: 1.31 - 2.62 ft/sec, indicative of limited community ambulator General Gait Details: hugging pillow and holding L arm not wanting sling, (walked around bed as performed canalith repositioning and pt needed to rest)   Stairs             Wheelchair Mobility    Modified Rankin (Stroke Patients Only)       Balance Overall balance assessment: Needs assistance Sitting-balance support: No upper extremity supported;Feet supported Sitting balance-Leahy Scale: Fair     Standing balance support: No upper extremity supported Standing balance-Leahy Scale: Fair Standing balance comment: Pt able to stand with minguardA for stability and did not need UE support                            Cognition Arousal/Alertness: Awake/alert Behavior During Therapy: WFL for tasks assessed/performed Overall Cognitive Status: Within Functional Limits for tasks assessed                                 General Comments: Pt reporting positional changes with a "whooshing feeling" rather than dizziness or light headedness.       Exercises      General Comments General comments (skin integrity, edema, etc.): Pt rechecked for right BPPV with a slight bit of nystagmus therefore performed canalith reposiitoning for right BPPV with  pt reporting no symptoms at end of treatment.        Pertinent Vitals/Pain Pain Assessment: Faces Faces Pain Scale: Hurts little more Pain Location: chest Pain Descriptors / Indicators: Discomfort Pain Intervention(s): Limited activity within patient's tolerance;Monitored during session;Repositioned    Home Living                      Prior Function            PT Goals (current goals can now be found  in the care plan section) Acute Rehab PT Goals Patient Stated Goal: to get rid of this whooshy feeling and fear of falling Progress towards PT goals: Progressing toward goals    Frequency    Min 3X/week      PT Plan Discharge plan needs to be updated    Co-evaluation              AM-PAC PT "6 Clicks" Mobility   Outcome Measure  Help needed turning from your back to your side while in a flat bed without using bedrails?: None Help needed moving from lying on your back to sitting on the side of a flat bed without using bedrails?: A Little Help needed moving to and from a bed to a chair (including a wheelchair)?: A Little Help needed standing up from a chair using your arms (e.g., wheelchair or bedside chair)?: A Little Help needed to walk in hospital room?: A Little Help needed climbing 3-5 steps with a railing? : A Little 6 Click Score: 19    End of Session Equipment Utilized During Treatment: Gait belt Activity Tolerance: Patient tolerated treatment well Patient left: with call bell/phone within reach;with family/visitor present;in chair Nurse Communication: Mobility status PT Visit Diagnosis: Difficulty in walking, not elsewhere classified (R26.2);Dizziness and giddiness (R42);Muscle weakness (generalized) (M62.81)     Time: 9629-5284 PT Time Calculation (min) (ACUTE ONLY): 29 min  Charges:  $Gait Training: 8-22 mins $Canalith Rep Proc: 8-22 mins                     Trevin Gartrell W,PT Acute Rehabilitation Services Pager:  [phone]  Office:  [place] 02/28/2020, 12:26 PM

## 2020-02-28 NOTE — Telephone Encounter (Signed)
To Dr. Gollan/ Pam to review.  °

## 2020-02-28 NOTE — Progress Notes (Signed)
Patient states that she is overwhelmed with recent discussion of possible cardiac catheterization tomorrow (02/28/2020). An extensive conversation with patient and family members (mother and husband) was had at bedside with primary RN, charge Charity fundraiser, and Water quality scientist. Patient frequently requesting to speak with Dr. Royann Shivers before agreeing to procedure tomorrow. This RN paged Fransico Michael, PA regarding Croitoru, MD's location. Fransico Michael, PA states that Dr. Royann Shivers is in a procedure and will speak with patient at bedside once he is available. Patient and family updated and are currently awaiting to speak with Croitoru, MD.

## 2020-02-29 ENCOUNTER — Encounter (HOSPITAL_COMMUNITY)
Admission: EM | Disposition: A | Discharge status: Home / Self Care | Payer: Self-pay | Source: Home / Self Care | Attending: Internal Medicine

## 2020-02-29 DIAGNOSIS — S0285XA Fracture of orbit, unspecified, initial encounter for closed fracture: Secondary | ICD-10-CM | POA: Diagnosis not present

## 2020-02-29 DIAGNOSIS — I214 Non-ST elevation (NSTEMI) myocardial infarction: Secondary | ICD-10-CM | POA: Diagnosis not present

## 2020-02-29 DIAGNOSIS — R55 Syncope and collapse: Secondary | ICD-10-CM | POA: Diagnosis not present

## 2020-02-29 DIAGNOSIS — I2510 Atherosclerotic heart disease of native coronary artery without angina pectoris: Secondary | ICD-10-CM | POA: Diagnosis not present

## 2020-02-29 DIAGNOSIS — S52502A Unspecified fracture of the lower end of left radius, initial encounter for closed fracture: Secondary | ICD-10-CM | POA: Diagnosis not present

## 2020-02-29 DIAGNOSIS — I25810 Atherosclerosis of coronary artery bypass graft(s) without angina pectoris: Secondary | ICD-10-CM | POA: Diagnosis not present

## 2020-02-29 HISTORY — PX: LEFT HEART CATH AND CORS/GRAFTS ANGIOGRAPHY: CATH118250

## 2020-02-29 LAB — GLUCOSE, CAPILLARY
Glucose-Capillary: 138 mg/dL — ABNORMAL HIGH (ref 70–99)
Glucose-Capillary: 159 mg/dL — ABNORMAL HIGH (ref 70–99)
Glucose-Capillary: 137 mg/dL — ABNORMAL HIGH (ref 70–99)
Glucose-Capillary: 83 mg/dL (ref 70–99)
Glucose-Capillary: 102 mg/dL — ABNORMAL HIGH (ref 70–99)

## 2020-02-29 SURGERY — LEFT HEART CATH AND CORS/GRAFTS ANGIOGRAPHY
Anesthesia: LOCAL

## 2020-02-29 MED ORDER — MIDAZOLAM HCL 2 MG/2ML IJ SOLN
INTRAMUSCULAR | Status: DC | PRN
  Administered 2020-02-29: 2 mg via INTRAVENOUS
  Administered 2020-02-29 (×2): 1 mg via INTRAVENOUS

## 2020-02-29 MED ORDER — HEPARIN (PORCINE) IN NACL 1000-0.9 UT/500ML-% IV SOLN
INTRAVENOUS | Status: DC | PRN
  Administered 2020-02-29 (×2): 500 mL

## 2020-02-29 MED ORDER — LABETALOL HCL 5 MG/ML IV SOLN: 10.0000 mg | INTRAVENOUS | Status: AC | PRN

## 2020-02-29 MED ORDER — LIDOCAINE HCL (PF) 1 % IJ SOLN
INTRAMUSCULAR | Status: DC | PRN
  Administered 2020-02-29: 30 mL

## 2020-02-29 MED ORDER — SODIUM CHLORIDE 0.9% FLUSH
3.0000 mL | Freq: Two times a day (BID) | INTRAVENOUS | Status: DC
  Administered 2020-02-29 (×2): 3 mL via INTRAVENOUS

## 2020-02-29 MED ORDER — MIDAZOLAM HCL 2 MG/2ML IJ SOLN
INTRAMUSCULAR | Status: AC
  Filled 2020-02-29: qty 2

## 2020-02-29 MED ORDER — SODIUM CHLORIDE 0.9 % IV SOLN: INTRAVENOUS | Status: AC

## 2020-02-29 MED ORDER — SODIUM CHLORIDE 0.9 % WEIGHT BASED INFUSION
1.0000 mL/kg/h | INTRAVENOUS | Status: DC
  Administered 2020-02-29: 1 mL/kg/h via INTRAVENOUS

## 2020-02-29 MED ORDER — SODIUM CHLORIDE 0.9% FLUSH: 3.0000 mL | INTRAVENOUS | Status: DC | PRN

## 2020-02-29 MED ORDER — SODIUM CHLORIDE 0.9% FLUSH
3.0000 mL | Freq: Two times a day (BID) | INTRAVENOUS | Status: DC
  Administered 2020-02-29 – 2020-03-01 (×2): 3 mL via INTRAVENOUS

## 2020-02-29 MED ORDER — FENTANYL CITRATE (PF) 100 MCG/2ML IJ SOLN
INTRAMUSCULAR | Status: DC | PRN
  Administered 2020-02-29 (×2): 25 ug via INTRAVENOUS
  Administered 2020-02-29: 50 ug via INTRAVENOUS

## 2020-02-29 MED ORDER — ASPIRIN 81 MG PO CHEW
81.0000 mg | CHEWABLE_TABLET | ORAL | Status: AC
  Administered 2020-02-29: 81 mg via ORAL
  Filled 2020-02-29: qty 1

## 2020-02-29 MED ORDER — IOHEXOL 350 MG/ML SOLN
INTRAVENOUS | Status: AC
  Filled 2020-02-29: qty 1

## 2020-02-29 MED ORDER — LIDOCAINE HCL (PF) 1 % IJ SOLN
INTRAMUSCULAR | Status: AC
  Filled 2020-02-29: qty 30

## 2020-02-29 MED ORDER — SODIUM CHLORIDE 0.9 % IV SOLN: 250.0000 mL | INTRAVENOUS | Status: DC | PRN

## 2020-02-29 MED ORDER — FENTANYL CITRATE (PF) 100 MCG/2ML IJ SOLN
INTRAMUSCULAR | Status: AC
  Filled 2020-02-29: qty 2

## 2020-02-29 MED ORDER — HEPARIN (PORCINE) IN NACL 1000-0.9 UT/500ML-% IV SOLN
INTRAVENOUS | Status: AC
  Filled 2020-02-29: qty 1000

## 2020-02-29 MED ORDER — IOHEXOL 350 MG/ML SOLN
INTRAVENOUS | Status: DC | PRN
  Administered 2020-02-29: 110 mL

## 2020-02-29 MED ORDER — SODIUM CHLORIDE 0.9 % WEIGHT BASED INFUSION
3.0000 mL/kg/h | INTRAVENOUS | Status: DC
  Administered 2020-02-29: 06:00:00 3 mL/kg/h via INTRAVENOUS

## 2020-02-29 MED ORDER — HYDRALAZINE HCL 20 MG/ML IJ SOLN: 10.0000 mg | INTRAMUSCULAR | Status: AC | PRN

## 2020-02-29 SURGICAL SUPPLY — 15 items
CATH EXPO 5F MPA-1 (CATHETERS) ×1 IMPLANT
CATH INFINITI 5 FR 3DRC (CATHETERS) ×1 IMPLANT
CATH INFINITI 5 FR LCB (CATHETERS) ×1 IMPLANT
CATH INFINITI 5 FR RCB (CATHETERS) ×1 IMPLANT
CATH INFINITI 5FR AL1 (CATHETERS) ×1 IMPLANT
CATH INFINITI MULTIPACK ST 5F (CATHETERS) ×1 IMPLANT
CLOSURE MYNX CONTROL 5F (Vascular Products) ×1 IMPLANT
KIT HEART LEFT (KITS) ×2 IMPLANT
PACK CARDIAC CATHETERIZATION (CUSTOM PROCEDURE TRAY) ×2 IMPLANT
SHEATH PINNACLE 5F 10CM (SHEATH) ×1 IMPLANT
SHEATH PROBE COVER 6X72 (BAG) ×1 IMPLANT
SYR MEDRAD MARK 7 150ML (SYRINGE) ×2 IMPLANT
TRANSDUCER W/STOPCOCK (MISCELLANEOUS) ×2 IMPLANT
TUBING CIL FLEX 10 FLL-RA (TUBING) ×2 IMPLANT
WIRE EMERALD 3MM-J .035X150CM (WIRE) ×1 IMPLANT

## 2020-02-29 NOTE — Progress Notes (Signed)
Right femoral vascular site dressing clean dry and intact patient completed her vs post heart cath today with vs stable and no distress noted.

## 2020-02-29 NOTE — H&P (View-Only) (Signed)
 Progress Note  Patient Name: Felicia [name] Date of Encounter: 02/29/2020  Primary Cardiologist: Timothy Gollan, MD   Subjective   Felicia [name] was quite emotional, since she felt that she was receiving conflicting advice regarding further care.  She has been reluctant to have any more invasive procedures since she is still very early in the healing process following bypass just 8 days ago. Had a lengthy conversation with her, Felicia [name] and Felicia [name]. Reviewed the current diagnostic dilemma and ways to proceed to better understand her current coronary status and prognosis and best course of care. She has not had any chest pain since admission.  She has not had either angina or any chest surgical pain (except tenderness if you actually push on her chest). Reviewed the preop coronary angiogram, the location of the bypasses and the results of the echocardiogram and cardiac enzyme test in detail. She has not had any arrhythmia on telemetry since admission. Her blood pressure was low again, when she woke up this morning at 94/50, although it increased when she was upset.  Inpatient Medications    Scheduled Meds: . aspirin  81 mg Oral Daily  . enoxaparin (LOVENOX) injection  40 mg Subcutaneous Q24H  . ezetimibe  10 mg Oral Daily  . insulin aspart  0-15 Units Subcutaneous TID WC  . insulin glargine  10 Units Subcutaneous Daily   And  . insulin glargine  8 Units Subcutaneous QHS  . multivitamin with minerals  1 tablet Oral Daily  . pravastatin  40 mg Oral QHS  . sodium chloride flush  10-40 mL Intracatheter Q12H  . sodium chloride flush  3 mL Intravenous Q12H   Continuous Infusions: . sodium chloride    . sodium chloride 1 mL/kg/hr (02/29/20 0640)   PRN Meds: sodium chloride, acetaminophen **OR** acetaminophen, sodium chloride flush, sodium chloride flush   Vital Signs    Vitals:   02/29/20 0039 02/29/20 0100 02/29/20 0634 02/29/20 0958  BP: (!) 94/50  (!) 148/65 (!) 154/83    Pulse: 73  82 80  Resp: 20  19 16  Temp: 98.6 F (37 C)  98.4 F (36.9 C) 98.6 F (37 C)  TempSrc: Oral  Oral Oral  SpO2: 94%  99% 95%  Weight:  64.9 kg    Height:        Intake/Output Summary (Last 24 hours) at 02/29/2020 1028 Last data filed at 02/29/2020 0836 Gross per 24 hour  Intake 2890.9 ml  Output 550 ml  Net 2340.9 ml   Last 3 Weights 02/29/2020 02/28/2020 02/27/2020  Weight (lbs) 143 lb 138 lb 10.7 oz 136 lb 7.4 oz  Weight (kg) 64.864 kg 62.9 kg 61.9 kg      Telemetry    NSR - Personally Reviewed  ECG    No new tracing- Personally Reviewed  Physical Exam  Developing right periorbital ecchymosis, well-healing sternotomy site, splint left forearm GEN: No acute distress.   Neck: No JVD Cardiac: RRR, no murmurs, rubs, or gallops.  Respiratory: Clear to auscultation bilaterally. GI: Soft, nontender, non-distended  MS: No edema; No deformity. Neuro:  Nonfocal  Psych: Normal affect   Labs    High Sensitivity Troponin:   Recent Labs  Lab 02/26/20 0404 02/26/20 0715 02/26/20 1214 02/26/20 1706 02/26/20 1957  TROPONINIHS 3,850* 2,678* 2,865* 4,160* 5,926*      Chemistry Recent Labs  Lab 02/24/20 0420 02/26/20 0007 02/27/20 0551  NA 136 138 138  K 4.1 4.0 4.1  CL 102 95*   98  CO2 23 29 31   GLUCOSE 188* 144* 192*  BUN 20 23* 22*  CREATININE 1.10* 1.16* 1.10*  CALCIUM 8.4* 9.3 8.5*  GFRNONAA 56* 52* 56*  GFRAA >60 >60 >60  ANIONGAP 11 14 9      Hematology Recent Labs  Lab 02/24/20 0420 02/26/20 0007 02/27/20 0540  WBC 8.2 9.5 9.1  RBC 3.92 4.63 4.56  HGB 11.4* 13.4 13.4  HCT 35.1* 41.2 41.5  MCV 89.5 89.0 91.0  MCH 29.1 28.9 29.4  MCHC 32.5 32.5 32.3  RDW 13.7 13.3 13.4  PLT 124* 247 279    BNP Recent Labs  Lab 02/26/20 0008  BNP 598.5*     DDimer No results for input(s): DDIMER in the last 168 hours.   Radiology    ECHOCARDIOGRAM LIMITED  Result Date: 02/27/2020    ECHOCARDIOGRAM LIMITED REPORT   Patient Name:   Felicia [name] Date of Exam: 02/27/2020 Medical Rec #:  Elliot Dally      Height:       64.0 in Accession #:    02/29/2020     Weight:       136.5 lb Date of Birth:  26-Apr-1961      BSA:          1.663 m Patient Age:    59 years       BP:           128/111 mmHg Patient Gender: F              HR:           80 bpm. Exam Location:  Inpatient Procedure: Limited Echo Indications:    Elevated troponin  History:        Patient has prior history of Echocardiogram examinations, most                 recent 02/17/2020. CAD, Prior CABG, Signs/Symptoms:Syncope and                 Chest Pain; Risk Factors:Hypertension, Diabetes, Dyslipidemia                 and Non-Smoker.  Sonographer:    02/19/1961 RDCS Referring Phys: 02/19/2020 Renella Cunas A ACHARYA IMPRESSIONS  1. Left ventricular ejection fraction, by estimation, is 55 to 60%. The left ventricle has normal function. The left ventricle demonstrates regional wall motion abnormalities (see scoring diagram/findings for description). Left ventricular diastolic function could not be evaluated. There is moderate hypokinesis of the left ventricular, apical anterior wall, lateral wall and apical segment.  2. Right ventricular systolic function is normal. The right ventricular size is normal.  3. The mitral valve is normal in structure. No evidence of mitral stenosis.  4. The aortic valve is normal in structure. Aortic valve regurgitation is not visualized. No aortic stenosis is present.  5. The inferior vena cava is normal in size with greater than 50% respiratory variability, suggesting right atrial pressure of 3 mmHg. Comparison(s): Prior images reviewed side by side. Changes from prior study are noted. The left ventricular wall motion abnormality is new, but overall left ventricular function remains normal. FINDINGS  Left Ventricle: Left ventricular ejection fraction, by estimation, is 55 to 60%. The left ventricle has normal function. The left ventricle demonstrates regional wall motion  abnormalities. Moderate hypokinesis of the left ventricular, apical anterior wall, lateral wall and apical segment. The left ventricular internal cavity size was normal in size. There is no left ventricular hypertrophy.  LV Wall  Scoring: The apical lateral segment, apical anterior segment, and apex are hypokinetic. Right Ventricle: The right ventricular size is normal. No increase in right ventricular wall thickness. Right ventricular systolic function is normal. Left Atrium: Left atrial size was normal in size. Right Atrium: Right atrial size was normal in size. Pericardium: There is no evidence of pericardial effusion. Mitral Valve: The mitral valve is normal in structure. Normal mobility of the mitral valve leaflets. No evidence of mitral valve stenosis. Tricuspid Valve: The tricuspid valve is normal in structure. No evidence of tricuspid stenosis. Aortic Valve: The aortic valve is normal in structure. Aortic valve regurgitation is not visualized. No aortic stenosis is present. Pulmonic Valve: The pulmonic valve was grossly normal. No evidence of pulmonic stenosis. Aorta: The aortic root and ascending aorta are structurally normal, with no evidence of dilitation. Venous: The inferior vena cava is normal in size with greater than 50% respiratory variability, suggesting right atrial pressure of 3 mmHg. IAS/Shunts: The interatrial septum was not assessed. Thurmon Fair MD Electronically signed by Thurmon Fair MD Signature Date/Time: 02/27/2020/11:47:43 AM    Final     Cardiac Studies   Echocardiogram 02/27/2020 1. Left ventricular ejection fraction, by estimation, is 55 to 60%. The  left ventricle has normal function. The left ventricle demonstrates  regional wall motion abnormalities (see scoring diagram/findings for  description). Left ventricular diastolic  function could not be evaluated. There is moderate hypokinesis of the left  ventricular, apical anterior wall, lateral wall and apical segment.    2. Right ventricular systolic function is normal. The right ventricular  size is normal.  3. The mitral valve is normal in structure. No evidence of mitral  stenosis.  4. The aortic valve is normal in structure. Aortic valve regurgitation is  not visualized. No aortic stenosis is present.  5. The inferior vena cava is normal in size with greater than 50%  respiratory variability, suggesting right atrial pressure of 3 mmHg.   Comparison(s): Prior images reviewed side by side. Changes from prior  study are noted. The left ventricular wall motion abnormality is new, but  overall left ventricular function remains normal.   Cardiac cath 02/17/2020  1st Mrg lesion is 60% stenosed.  Prox LAD to Mid LAD lesion is 90% stenosed.  Mid LAD-1 lesion is 80% stenosed.  Mid LAD-2 lesion is 50% stenosed.  1st Diag-1 lesion is 95% stenosed.  1st Diag-2 lesion is 70% stenosed.  Ost RCA to Prox RCA lesion is 70% stenosed.  Mid RCA lesion is 70% stenosed.  RPDA lesion is 50% stenosed.  The left ventricular ejection fraction is greater than 65% by visual estimate.  LV end diastolic pressure is normal.  The left ventricular systolic function is normal.  There is no mitral valve regurgitation.  Diagnostic Dominance: Right    02/21/2020 Procedure: CABG X 5, LIMA LAD,RSVG, OM1, D1, PDA, PLV (Y graft) Endoscopic greater saphenous vein harvest on the right   Patient Profile     59 y.o. female now postop day 8 after CABG x5, insulin requiring diabetes mellitus, readmitted on postop day 5 after witnessed syncope at home, found to have elevated cardiac enzymes and a new apical wall motion abnormality on echocardiography  Assessment & Plan    By history, the cause of her syncope still appears to be orthostatic hypotension/vasovagal event. However, cardiac enzymes were elevated on admission, showed an initial downward trend and then slightly increased again, suggesting an interval  acute coronary syndrome timed around her syncopal event.  The echocardiogram shows a new apical wall motion abnormality, although overall left ventricular systolic function is preserved. There is therefore concern about potential LIMA graft failure, although the extent of the wall motion abnormality and the degree of enzyme elevation is much smaller than one would expect from proximal or mid LAD acute coronary injury. The patient has been very reluctant to undergo invasive procedures since she feels she has been through a lot already and needs to heal after her bypass surgery. After reviewed the pros and cons of coronary angiography (and potential percutaneous revascularization and stent placement) I believe that both the patient and her family are comfortable with the need for more precise diagnosis. If her LIMA bypass is compromised, she would probably benefit from stenting of the proximal LAD.  She has a recent left radial/ulnar wrist fracture and that forearm is currently splinted. Explained that right radial approach would make it very difficult to engage the bypass grafts, in particular it would be difficult to engage the LIMA. Her previous cardiac catheterization was performed on April 15 from the right groin and a Mynx device was placed.     This procedure (cardiac catheterization and possible angioplasty-stent) has been fully reviewed with the patient and written informed consent has been obtained.   For questions or updates, please contact [facility] Please consult [email or web address] for contact info under        Signed, Sanda Klein, MD  02/29/2020, 10:28 AM

## 2020-02-29 NOTE — Progress Notes (Signed)
Dr.Croitoru will be up to talk with patient aware patient wants better rationale for procedure.

## 2020-02-29 NOTE — Progress Notes (Signed)
OT Cancellation Note  Patient Details Name: [name] MRN: [id number] DOB: [birth year]   Cancelled Treatment:    Reason Eval/Treat Not Completed: Patient at procedure or test/ unavailable(Pt unavailable at heart cath when OT checked x2 times.)  OT to continue to follow for OT intervention.  Flora Lipps, OTR/L Acute Rehabilitation Services Pager: [phone] Office: [phone]   Flora Lipps 02/29/2020, 4:14 PM

## 2020-02-29 NOTE — Interval H&P Note (Signed)
History and Physical Interval Note:  02/29/2020 12:02 PM  Felicia [name]  has presented today for surgery, with the diagnosis of mi - syncope.  The various methods of treatment have been discussed with the patient and family. After consideration of risks, benefits and other options for treatment, the patient has consented to  Procedure(s): LEFT HEART CATH AND CORS/GRAFTS ANGIOGRAPHY (N/A) as a surgical intervention.  The patient's history has been reviewed, patient examined, no change in status, stable for surgery.  I have reviewed the patient's chart and labs.  Questions were answered to the patient's satisfaction.    Cath Lab Visit (complete for each Cath Lab visit)  Clinical Evaluation Leading to the Procedure:   ACS: Yes.    Non-ACS:    Anginal Classification: CCS I  Anti-ischemic medical therapy: Minimal Therapy (1 class of medications)  Non-Invasive Test Results: No non-invasive testing performed  Prior CABG: Previous CABG        Verne Carrow

## 2020-02-29 NOTE — Telephone Encounter (Signed)
Dr. Mariah Milling sent secure chat message to provider and nurse caring for her today.

## 2020-02-29 NOTE — Progress Notes (Signed)
Patient refusing sign consent for heart cath wants better explaintion from heart MD writer paged on call MD await response. Patient agitated per hs nurse with lots c/o's very touchy,husband stayed late last hs to talk to MD he was busy last hs did not come per patient report last hs. Trying to argue with staff writer in and out quickly to check on trying to avoid confertation with patient.

## 2020-02-29 NOTE — Progress Notes (Signed)
Physical Therapy Treatment Patient Details Name: Felicia [name] MRN: [id number] DOB: 1960/12/31 Today's Date: 02/29/2020    History of Present Illness 59 year old female who presented after mechanical fall and syncope complicated by left radius/ulna fracture, left orbital wall, lateral maxillary wall, inferior orbital floor fracture.  Treatment for right BPPV as well by PT. PMHx: coronary artery disease status post 5 vessel bypass February 21, 2020, T2DM, HTN and DLD She was discharged from the hospital 2 days ago.     PT Comments    Pt admitted with above diagnosis. Pt was able to ambulate with min guard assist without device. Occasional need for steadyingwith turns in hallway and drifts to right at times. Family present and aware.  Education regarding BPPV and use of gait belt complete.  Pt progressing and each treatment makes pt feel better.  Distance progressed today.   Pt currently with functional limitations due to balance and endurance deficits. Pt will benefit from skilled PT to increase their independence and safety with mobility to allow discharge to the venue listed below.     Follow Up Recommendations  Home health PT(vestibular)     Equipment Recommendations  None recommended by PT(gait belt issued)    Recommendations for Other Services       Precautions / Restrictions Precautions Precautions: Fall;Sternal Precaution Comments:  uses heart pillow, splint on L UE Restrictions Weight Bearing Restrictions: Yes LUE Weight Bearing: Non weight bearing Other Position/Activity Restrictions: sternal precautions    Mobility  Bed Mobility Overal bed mobility: Needs Assistance Bed Mobility: Rolling;Sidelying to Sit Rolling: Min guard Sidelying to sit: Min assist     Sit to sidelying: Min guard General bed mobility comments: Pt able to guide self without use of LUE and abiding by sternal precautions by hugging heart pillow.   Transfers Overall transfer level: Needs  assistance Equipment used: None Transfers: Sit to/from Stand Sit to Stand: Min guard         General transfer comment: Pt  minguardA  sit to stand.  Pt hugging heart pillow for transfers and mobility  Ambulation/Gait Ambulation/Gait assistance: Min guard Gait Distance (Feet): 150 Feet Assistive device: None Gait Pattern/deviations: Step-through pattern;Decreased stride length   Gait velocity interpretation: 1.31 - 2.62 ft/sec, indicative of limited community ambulator General Gait Details: hugging pillow and holding L arm not wanting sling. Showed pts mom and husband how to guard pt with gait belt that PT issued to them.  Also discussed safety with walking.  Discussed [facility] Miles for future if BPPV persists. discussed use of Meclizine if the cardiac MD agrees. Instructed to not use Meclizine long term as it suppressess the system. Gave them a handout regarding BPPV explanation.    Stairs             Wheelchair Mobility    Modified Rankin (Stroke Patients Only)       Balance Overall balance assessment: Needs assistance Sitting-balance support: No upper extremity supported;Feet supported Sitting balance-Leahy Scale: Fair     Standing balance support: No upper extremity supported Standing balance-Leahy Scale: Fair Standing balance comment: Pt able to stand with minguardA for stability and did not need UE support                            Cognition Arousal/Alertness: Awake/alert Behavior During Therapy: WFL for tasks assessed/performed Overall Cognitive Status: Within Functional Limits for tasks assessed  General Comments: Pt reporting positional changes with a "whooshing feeling" rather than dizziness or light headedness.       Exercises      General Comments General comments (skin integrity, edema, etc.): Pt rechecked for right BPPV with a slight bit of nystagmus therefore performed canalith  reposiitoning for right BPPV with pt reporting no symptoms at end of treatment.        Pertinent Vitals/Pain Pain Assessment: Faces Faces Pain Scale: Hurts little more Pain Location: chest Pain Descriptors / Indicators: Discomfort Pain Intervention(s): Limited activity within patient's tolerance;Monitored during session;Repositioned    Home Living                      Prior Function            PT Goals (current goals can now be found in the care plan section) Acute Rehab PT Goals Patient Stated Goal: to get rid of this whooshy feeling and fear of falling Progress towards PT goals: Progressing toward goals    Frequency    Min 3X/week      PT Plan Current plan remains appropriate    Co-evaluation              AM-PAC PT "6 Clicks" Mobility   Outcome Measure  Help needed turning from your back to your side while in a flat bed without using bedrails?: None Help needed moving from lying on your back to sitting on the side of a flat bed without using bedrails?: A Little Help needed moving to and from a bed to a chair (including a wheelchair)?: A Little Help needed standing up from a chair using your arms (e.g., wheelchair or bedside chair)?: A Little Help needed to walk in hospital room?: A Little Help needed climbing 3-5 steps with a railing? : A Little 6 Click Score: 19    End of Session Equipment Utilized During Treatment: Gait belt Activity Tolerance: Patient tolerated treatment well Patient left: with call bell/phone within reach;with family/visitor present;in chair Nurse Communication: Mobility status PT Visit Diagnosis: Difficulty in walking, not elsewhere classified (R26.2);Dizziness and giddiness (R42);Muscle weakness (generalized) (M62.81)     Time: 0930-1008 PT Time Calculation (min) (ACUTE ONLY): 38 min  Charges:  $Gait Training: 8-22 mins $Therapeutic Activity: 8-22 mins $Canalith Rep Proc: 8-22 mins                     Zein Helbing  W,PT Acute Rehabilitation Services Pager:  [phone]  Office:  [place] 02/29/2020, 11:36 AM

## 2020-02-29 NOTE — Progress Notes (Signed)
Patient c/o about our sliding scale reports not like hers at home reported could not give any other insulin to her without MD orders writer paged md ask. Her CBG is 83 at present our scale coverage does not start till greater than 120. Husband at bedside assisting patient with tray etc. Patient is cooperative no further c/o's noted. MD informed of her c/o reported would evaluate her sliding scale see needs be adjusted.

## 2020-02-29 NOTE — Progress Notes (Signed)
Writer in gave meds to patient, family and rehab at bedside husband asked me what happened this morning reported patient angry I did not want to argue with her so I left the room he reported she states I slammed door reported no did not slam door it was open trying to avoid any confrontation. He was satified with my response did not persue me further.

## 2020-02-29 NOTE — Progress Notes (Signed)
PROGRESS NOTE    Felicia [name]  YQI:[id number] DOB: 08-19-1961 DOA: 02/25/2020 PCP: Ria Bush, MD    Brief Narrative:  Patient was admitted to the hospital with a working diagnosis ofsyncope complicated withleft distal radius and ulna fractures, nondisplaced orbital and maxillary fractures,in the setting ofelevated troponin  59 year old female who presented after mechanical fall and syncope. She does have significant past medical history for coronary artery disease status post 5 vessel bypass February 21, 2020, she has type 2 diabetes mellitus, hypertension, and dyslipidemia. She was discharged from the hospital 2 days ago.Apparently patient was sitting watching TV, thenshe stood up to go to the bathroomwith the intention tomove her bowels, while walking she had a syncope episode with prodromaldizziness.Post trauma she had facial and left wrist pain.On her initial physical examination her blood pressure was 135/75, heart rate 85, respiratory rate 26, oxygen saturation 94%, her lungs are clear to auscultation bilaterally, heart S1-S2 present rhythm, abdomen was soft, no lower extremity edema, she had a left wrist splint in place. Sodium 138, potassium 4.1, chloride 95, bicarb 29, glucose 144, BUN 23, creatinine 1.16, troponin I 4,550,white count 9.5, hemoglobin 13.4, hematocrit 41.2, platelets 247.Head CT no acute changes. EKG 84 bpm, normal axis, normal intervals, sinus rhythm, no ST segment or T wave changes.  Patient was admitted to the progressive care unit, telemetry with no arrhythmias.Her troponins remained elevated but not in a acute coronary syndrome pattern.   Patient with severe orthostatic hypotension, her discharge for 04/25 was cancelled and patient was placed on IV fluids.   Today orthostatics have been improved and IV fluids have been discontinued.  Patient scheduled for coronary angiography for today.    Assessment & Plan:   Principal  Problem:   Syncope Active Problems:   S/P CABG (coronary artery bypass graft)   CAD (coronary artery disease)   Radius and ulna distal fracture   Orbital fracture (HCC)   1.Syncope, complicated by left radius/ulna fracture, left orbital wall, lateral maxillary wall,inferior orbital floor fracture. Limited echocardiogram with preserved LV systolic function, new LV apical anterior, lateral wall and apical segment hypokinesis. Orthostatic hypotension has resolved:   Supine: 134/81 HR 81 Standing 121/81  HR 83  Discontinue isotonic saline infusion, for now will continue to hold on cardiac agents, ace inh and b blockade.  Left arm with splint in place, follow with orthopedics as outpatient.   2.Coronary status post CABG, elevated troponins. Cardiac catheterization with early failure 4/5 bypass grafts due to a small target vessel size. Occluded graft to RCA. Recommendations for medical therapy and potential PCI to RCA in case of angina symptoms.   Will continue with asa and statin, resume guideline directed therapy progressively.   3.Uncontrolled type 1 diabetes mellitus/dyslipidemia.Capillary glucose this am 138, continue with insulin sliding scale for glucose cover and monitoring, continue with pravastatin and ezetimibe.  4.Hypertension. Improved blood pressure, plan to resume progressively in the next 24 to 48 H.     DVT prophylaxis:      Enoxaparin   Code Status:              full  Family Communication:       I spoke with patient's mother and husband at the bedside, we talked in detail about patient's condition, plan of care and prognosis and all questions were addressed.  Disposition Plan:              Patient is from:  Home              Anticipated DC to:                   Home              Anticipated DC date:               04/27             Anticipated DC barriers:         patient today is post cath, will plan for possible dc in am.       Consultants:   Cardiology      Subjective: Patient is feeling better, no nausea or vomiting, no dyspnea or chest pain, left arm with less edema and improved local pain,   Objective: Vitals:   02/28/20 2155 02/29/20 0039 02/29/20 0100 02/29/20 0634  BP: (!) 85/52 (!) 94/50  (!) 148/65  Pulse: 76 73  82  Resp: 20 20  19   Temp: 98.3 F (36.8 C) 98.6 F (37 C)  98.4 F (36.9 C)  TempSrc: Oral Oral  Oral  SpO2: 100% 94%  99%  Weight:   64.9 kg   Height:        Intake/Output Summary (Last 24 hours) at 02/29/2020 0819 Last data filed at 02/29/2020 0659 Gross per 24 hour  Intake 2670.9 ml  Output 550 ml  Net 2120.9 ml   Filed Weights   02/27/20 0448 02/28/20 0441 02/29/20 0100  Weight: 61.9 kg 62.9 kg 64.9 kg    Examination:   General: Not in pain or dyspnea.  Neurology: Awake and alert, non focal  E ENT: mild pallor, no icterus, oral mucosa moist Cardiovascular: No JVD. S1-S2 present, rhythmic, no gallops, rubs, or murmurs. No lower extremity edema. Pulmonary: vesicular breath sounds bilaterally, adequate air movement, no wheezing, rhonchi or rales. Gastrointestinal. Abdomen with no organomegaly, non tender, no rebound or guarding Skin. Right orbital ecchymosis Musculoskeletal: right arm in splint, improved fingers edema.      Data Reviewed: I have personally reviewed following labs and imaging studies  CBC: Recent Labs  Lab 02/22/20 1551 02/23/20 0623 02/24/20 0420 02/26/20 0007 02/27/20 0540  WBC 8.8 9.8 8.2 9.5 9.1  NEUTROABS  --   --   --   --  4.9  HGB 11.4* 11.7* 11.4* 13.4 13.4  HCT 35.8* 37.2 35.1* 41.2 41.5  MCV 91.1 93.2 89.5 89.0 91.0  PLT PLATELET CLUMPS NOTED ON SMEAR, UNABLE TO ESTIMATE 149* 124* 247 279   Basic Metabolic Panel: Recent Labs  Lab 02/22/20 1551 02/23/20 0623 02/24/20 0420 02/26/20 0007 02/27/20 0551  NA 140 139 136 138 138  K 4.6 4.5 4.1 4.0 4.1  CL 110 105 102 95* 98  CO2 19* 17* 23 29 31   GLUCOSE 139*  119* 188* 144* 192*  BUN 17 18 20  23* 22*  CREATININE 0.98 1.05* 1.10* 1.16* 1.10*  CALCIUM 8.0* 8.6* 8.4* 9.3 8.5*  MG 2.3  --   --   --   --    GFR: Estimated Creatinine Clearance: 48.7 mL/min (A) (by C-G formula based on SCr of 1.1 mg/dL (H)). Liver Function Tests: No results for input(s): AST, ALT, ALKPHOS, BILITOT, PROT, ALBUMIN in the last 168 hours. No results for input(s): LIPASE, AMYLASE in the last 168 hours. No results for input(s): AMMONIA in the last 168 hours. Coagulation Profile: No results for input(s): INR, PROTIME in the last 168 hours. Cardiac  Enzymes: No results for input(s): CKTOTAL, CKMB, CKMBINDEX, TROPONINI in the last 168 hours. BNP (last 3 results) No results for input(s): PROBNP in the last 8760 hours. HbA1C: No results for input(s): HGBA1C in the last 72 hours. CBG: Recent Labs  Lab 02/28/20 0614 02/28/20 1123 02/28/20 1621 02/28/20 2345 02/29/20 0637  GLUCAP 144* 159* 182* 187* 138*   Lipid Profile: No results for input(s): CHOL, HDL, LDLCALC, TRIG, CHOLHDL, LDLDIRECT in the last 72 hours. Thyroid Function Tests: No results for input(s): TSH, T4TOTAL, FREET4, T3FREE, THYROIDAB in the last 72 hours. Anemia Panel: No results for input(s): VITAMINB12, FOLATE, FERRITIN, TIBC, IRON, RETICCTPCT in the last 72 hours.    Radiology Studies: I have reviewed all of the imaging during this hospital visit personally     Scheduled Meds: . aspirin  81 mg Oral Daily  . enoxaparin (LOVENOX) injection  40 mg Subcutaneous Q24H  . ezetimibe  10 mg Oral Daily  . insulin aspart  0-15 Units Subcutaneous TID WC  . insulin glargine  10 Units Subcutaneous Daily   And  . insulin glargine  8 Units Subcutaneous QHS  . multivitamin with minerals  1 tablet Oral Daily  . pravastatin  40 mg Oral QHS  . sodium chloride flush  10-40 mL Intracatheter Q12H  . sodium chloride flush  3 mL Intravenous Q12H   Continuous Infusions: . sodium chloride    . sodium chloride  1 mL/kg/hr (02/29/20 0640)     LOS: 3 days        Dontae Minerva Annett Gula, MD

## 2020-02-29 NOTE — Progress Notes (Addendum)
Progress Note  Patient Name: Felicia [name] Date of Encounter: 02/29/2020  Primary Cardiologist: Julien Nordmann, MD   Subjective   Felicia [name] was quite emotional, since she felt that she was receiving conflicting advice regarding further care.  She has been reluctant to have any more invasive procedures since she is still very early in the healing process following bypass just 8 days ago. Had a lengthy conversation with her, Felicia [name] and Felicia [name]. Reviewed the current diagnostic dilemma and ways to proceed to better understand her current coronary status and prognosis and best course of care. She has not had any chest pain since admission.  She has not had either angina or any chest surgical pain (except tenderness if you actually push on her chest). Reviewed the preop coronary angiogram, the location of the bypasses and the results of the echocardiogram and cardiac enzyme test in detail. She has not had any arrhythmia on telemetry since admission. Her blood pressure was low again, when she woke up this morning at 94/50, although it increased when she was upset.  Inpatient Medications    Scheduled Meds: . aspirin  81 mg Oral Daily  . enoxaparin (LOVENOX) injection  40 mg Subcutaneous Q24H  . ezetimibe  10 mg Oral Daily  . insulin aspart  0-15 Units Subcutaneous TID WC  . insulin glargine  10 Units Subcutaneous Daily   And  . insulin glargine  8 Units Subcutaneous QHS  . multivitamin with minerals  1 tablet Oral Daily  . pravastatin  40 mg Oral QHS  . sodium chloride flush  10-40 mL Intracatheter Q12H  . sodium chloride flush  3 mL Intravenous Q12H   Continuous Infusions: . sodium chloride    . sodium chloride 1 mL/kg/hr (02/29/20 0640)   PRN Meds: sodium chloride, acetaminophen **OR** acetaminophen, sodium chloride flush, sodium chloride flush   Vital Signs    Vitals:   02/29/20 0039 02/29/20 0100 02/29/20 0634 02/29/20 0958  BP: (!) 94/50  (!) 148/65 (!) 154/83    Pulse: 73  82 80  Resp: 20  19 16   Temp: 98.6 F (37 C)  98.4 F (36.9 C) 98.6 F (37 C)  TempSrc: Oral  Oral Oral  SpO2: 94%  99% 95%  Weight:  64.9 kg    Height:        Intake/Output Summary (Last 24 hours) at 02/29/2020 1028 Last data filed at 02/29/2020 0836 Gross per 24 hour  Intake 2890.9 ml  Output 550 ml  Net 2340.9 ml   Last 3 Weights 02/29/2020 02/28/2020 02/27/2020  Weight (lbs) 143 lb 138 lb 10.7 oz 136 lb 7.4 oz  Weight (kg) 64.864 kg 62.9 kg 61.9 kg      Telemetry    NSR - Personally Reviewed  ECG    No new tracing- Personally Reviewed  Physical Exam  Developing right periorbital ecchymosis, well-healing sternotomy site, splint left forearm GEN: No acute distress.   Neck: No JVD Cardiac: RRR, no murmurs, rubs, or gallops.  Respiratory: Clear to auscultation bilaterally. GI: Soft, nontender, non-distended  MS: No edema; No deformity. Neuro:  Nonfocal  Psych: Normal affect   Labs    High Sensitivity Troponin:   Recent Labs  Lab 02/26/20 0404 02/26/20 0715 02/26/20 1214 02/26/20 1706 02/26/20 1957  TROPONINIHS 3,850* 2,678* 2,865* 4,160* 5,926*      Chemistry Recent Labs  Lab 02/24/20 0420 02/26/20 0007 02/27/20 0551  NA 136 138 138  K 4.1 4.0 4.1  CL 102 95*  98  CO2 23 29 31   GLUCOSE 188* 144* 192*  BUN 20 23* 22*  CREATININE 1.10* 1.16* 1.10*  CALCIUM 8.4* 9.3 8.5*  GFRNONAA 56* 52* 56*  GFRAA >60 >60 >60  ANIONGAP 11 14 9      Hematology Recent Labs  Lab 02/24/20 0420 02/26/20 0007 02/27/20 0540  WBC 8.2 9.5 9.1  RBC 3.92 4.63 4.56  HGB 11.4* 13.4 13.4  HCT 35.1* 41.2 41.5  MCV 89.5 89.0 91.0  MCH 29.1 28.9 29.4  MCHC 32.5 32.5 32.3  RDW 13.7 13.3 13.4  PLT 124* 247 279    BNP Recent Labs  Lab 02/26/20 0008  BNP 598.5*     DDimer No results for input(s): DDIMER in the last 168 hours.   Radiology    ECHOCARDIOGRAM LIMITED  Result Date: 02/27/2020    ECHOCARDIOGRAM LIMITED REPORT   Patient Name:   Felicia [name] Date of Exam: 02/27/2020 Medical Rec #:  Felicia [name]      Height:       64.0 in Accession #:    02/29/2020     Weight:       136.5 lb Date of Birth:  26-Apr-1961      BSA:          1.663 m Patient Age:    59 years       BP:           128/111 mmHg Patient Gender: F              HR:           80 bpm. Exam Location:  Inpatient Procedure: Limited Echo Indications:    Elevated troponin  History:        Patient has prior history of Echocardiogram examinations, most                 recent 02/17/2020. CAD, Prior CABG, Signs/Symptoms:Syncope and                 Chest Pain; Risk Factors:Hypertension, Diabetes, Dyslipidemia                 and Non-Smoker.  Sonographer:    02/19/1961 RDCS Referring Phys: 02/19/2020 Renella Cunas A ACHARYA IMPRESSIONS  1. Left ventricular ejection fraction, by estimation, is 55 to 60%. The left ventricle has normal function. The left ventricle demonstrates regional wall motion abnormalities (see scoring diagram/findings for description). Left ventricular diastolic function could not be evaluated. There is moderate hypokinesis of the left ventricular, apical anterior wall, lateral wall and apical segment.  2. Right ventricular systolic function is normal. The right ventricular size is normal.  3. The mitral valve is normal in structure. No evidence of mitral stenosis.  4. The aortic valve is normal in structure. Aortic valve regurgitation is not visualized. No aortic stenosis is present.  5. The inferior vena cava is normal in size with greater than 50% respiratory variability, suggesting right atrial pressure of 3 mmHg. Comparison(s): Prior images reviewed side by side. Changes from prior study are noted. The left ventricular wall motion abnormality is new, but overall left ventricular function remains normal. FINDINGS  Left Ventricle: Left ventricular ejection fraction, by estimation, is 55 to 60%. The left ventricle has normal function. The left ventricle demonstrates regional wall motion  abnormalities. Moderate hypokinesis of the left ventricular, apical anterior wall, lateral wall and apical segment. The left ventricular internal cavity size was normal in size. There is no left ventricular hypertrophy.  LV Wall  Scoring: The apical lateral segment, apical anterior segment, and apex are hypokinetic. Right Ventricle: The right ventricular size is normal. No increase in right ventricular wall thickness. Right ventricular systolic function is normal. Left Atrium: Left atrial size was normal in size. Right Atrium: Right atrial size was normal in size. Pericardium: There is no evidence of pericardial effusion. Mitral Valve: The mitral valve is normal in structure. Normal mobility of the mitral valve leaflets. No evidence of mitral valve stenosis. Tricuspid Valve: The tricuspid valve is normal in structure. No evidence of tricuspid stenosis. Aortic Valve: The aortic valve is normal in structure. Aortic valve regurgitation is not visualized. No aortic stenosis is present. Pulmonic Valve: The pulmonic valve was grossly normal. No evidence of pulmonic stenosis. Aorta: The aortic root and ascending aorta are structurally normal, with no evidence of dilitation. Venous: The inferior vena cava is normal in size with greater than 50% respiratory variability, suggesting right atrial pressure of 3 mmHg. IAS/Shunts: The interatrial septum was not assessed. Thurmon Fair MD Electronically signed by Thurmon Fair MD Signature Date/Time: 02/27/2020/11:47:43 AM    Final     Cardiac Studies   Echocardiogram 02/27/2020 1. Left ventricular ejection fraction, by estimation, is 55 to 60%. The  left ventricle has normal function. The left ventricle demonstrates  regional wall motion abnormalities (see scoring diagram/findings for  description). Left ventricular diastolic  function could not be evaluated. There is moderate hypokinesis of the left  ventricular, apical anterior wall, lateral wall and apical segment.    2. Right ventricular systolic function is normal. The right ventricular  size is normal.  3. The mitral valve is normal in structure. No evidence of mitral  stenosis.  4. The aortic valve is normal in structure. Aortic valve regurgitation is  not visualized. No aortic stenosis is present.  5. The inferior vena cava is normal in size with greater than 50%  respiratory variability, suggesting right atrial pressure of 3 mmHg.   Comparison(s): Prior images reviewed side by side. Changes from prior  study are noted. The left ventricular wall motion abnormality is new, but  overall left ventricular function remains normal.   Cardiac cath 02/17/2020  1st Mrg lesion is 60% stenosed.  Prox LAD to Mid LAD lesion is 90% stenosed.  Mid LAD-1 lesion is 80% stenosed.  Mid LAD-2 lesion is 50% stenosed.  1st Diag-1 lesion is 95% stenosed.  1st Diag-2 lesion is 70% stenosed.  Ost RCA to Prox RCA lesion is 70% stenosed.  Mid RCA lesion is 70% stenosed.  RPDA lesion is 50% stenosed.  The left ventricular ejection fraction is greater than 65% by visual estimate.  LV end diastolic pressure is normal.  The left ventricular systolic function is normal.  There is no mitral valve regurgitation.  Diagnostic Dominance: Right    02/21/2020 Procedure: CABG X 5, LIMA LAD,RSVG, OM1, D1, PDA, PLV (Y graft) Endoscopic greater saphenous vein harvest on the right   Patient Profile     59 y.o. female now postop day 8 after CABG x5, insulin requiring diabetes mellitus, readmitted on postop day 5 after witnessed syncope at home, found to have elevated cardiac enzymes and a new apical wall motion abnormality on echocardiography  Assessment & Plan    By history, the cause of her syncope still appears to be orthostatic hypotension/vasovagal event. However, cardiac enzymes were elevated on admission, showed an initial downward trend and then slightly increased again, suggesting an interval  acute coronary syndrome timed around her syncopal event.  The echocardiogram shows a new apical wall motion abnormality, although overall left ventricular systolic function is preserved. There is therefore concern about potential LIMA graft failure, although the extent of the wall motion abnormality and the degree of enzyme elevation is much smaller than one would expect from proximal or mid LAD acute coronary injury. The patient has been very reluctant to undergo invasive procedures since she feels she has been through a lot already and needs to heal after her bypass surgery. After reviewed the pros and cons of coronary angiography (and potential percutaneous revascularization and stent placement) I believe that both the patient and her family are comfortable with the need for more precise diagnosis. If her LIMA bypass is compromised, she would probably benefit from stenting of the proximal LAD.  She has a recent left radial/ulnar wrist fracture and that forearm is currently splinted. Explained that right radial approach would make it very difficult to engage the bypass grafts, in particular it would be difficult to engage the LIMA. Her previous cardiac catheterization was performed on April 15 from the right groin and a Mynx device was placed.     This procedure (cardiac catheterization and possible angioplasty-stent) has been fully reviewed with the patient and written informed consent has been obtained.   For questions or updates, please contact [facility] Please consult [email or web address] for contact info under        Signed, Sanda Klein, MD  02/29/2020, 10:28 AM

## 2020-02-29 NOTE — Telephone Encounter (Signed)
Patient calling States that she is in an emergency situation They are planning on doing another cath this afternoon at 3:30pm and no one has talked with her about it  Would like to discuss Please call

## 2020-03-01 ENCOUNTER — Telehealth: Payer: Self-pay | Admitting: Cardiovascular Disease

## 2020-03-01 ENCOUNTER — Other Ambulatory Visit: Payer: Self-pay | Admitting: Medical

## 2020-03-01 DIAGNOSIS — S0285XA Fracture of orbit, unspecified, initial encounter for closed fracture: Secondary | ICD-10-CM | POA: Diagnosis not present

## 2020-03-01 DIAGNOSIS — I214 Non-ST elevation (NSTEMI) myocardial infarction: Secondary | ICD-10-CM | POA: Diagnosis not present

## 2020-03-01 DIAGNOSIS — S52502G Unspecified fracture of the lower end of left radius, subsequent encounter for closed fracture with delayed healing: Secondary | ICD-10-CM

## 2020-03-01 DIAGNOSIS — I2510 Atherosclerotic heart disease of native coronary artery without angina pectoris: Secondary | ICD-10-CM | POA: Diagnosis not present

## 2020-03-01 DIAGNOSIS — S52602G Unspecified fracture of lower end of left ulna, subsequent encounter for closed fracture with delayed healing: Secondary | ICD-10-CM

## 2020-03-01 DIAGNOSIS — S0285XG Fracture of orbit, unspecified, subsequent encounter for fracture with delayed healing: Secondary | ICD-10-CM | POA: Diagnosis not present

## 2020-03-01 DIAGNOSIS — R55 Syncope and collapse: Secondary | ICD-10-CM | POA: Diagnosis not present

## 2020-03-01 LAB — GLUCOSE, CAPILLARY
Glucose-Capillary: 49 mg/dL — ABNORMAL LOW (ref 70–99)
Glucose-Capillary: 117 mg/dL — ABNORMAL HIGH (ref 70–99)
Glucose-Capillary: 122 mg/dL — ABNORMAL HIGH (ref 70–99)

## 2020-03-01 LAB — CBC
HCT: 40.6 % (ref 36.0–46.0)
Hemoglobin: 12.7 g/dL (ref 12.0–15.0)
MCH: 29.3 pg (ref 26.0–34.0)
MCHC: 31.3 g/dL (ref 30.0–36.0)
MCV: 93.8 fL (ref 80.0–100.0)
Platelets: 338 10*3/uL (ref 150–400)
RBC: 4.33 MIL/uL (ref 3.87–5.11)
RDW: 13.4 % (ref 11.5–15.5)
WBC: 8.3 10*3/uL (ref 4.0–10.5)
nRBC: 0 % (ref 0.0–0.2)

## 2020-03-01 LAB — BASIC METABOLIC PANEL
Anion gap: 12 (ref 5–15)
BUN: 11 mg/dL (ref 6–20)
CO2: 23 mmol/L (ref 22–32)
Calcium: 8.5 mg/dL — ABNORMAL LOW (ref 8.9–10.3)
Chloride: 104 mmol/L (ref 98–111)
Creatinine, Ser: 0.97 mg/dL (ref 0.44–1.00)
GFR calc Af Amer: >60 mL/min (ref >60)
GFR calc non Af Amer: >60 mL/min (ref >60)
Glucose, Bld: 124 mg/dL — ABNORMAL HIGH (ref 70–99)
Potassium: 4.7 mmol/L (ref 3.5–5.1)
Sodium: 139 mmol/L (ref 135–145)

## 2020-03-01 MED ORDER — PRAVASTATIN SODIUM 40 MG PO TABS: 40.0000 mg | ORAL_TABLET | Freq: Every day | ORAL | 3 refills | Status: DC

## 2020-03-01 MED ORDER — INSULIN ASPART 100 UNIT/ML ~~LOC~~ SOLN: 0.0000 [IU] | Freq: Three times a day (TID) | SUBCUTANEOUS | Status: DC

## 2020-03-01 NOTE — Progress Notes (Signed)
Went over discharge instructions with the patient. Patient verbalizes understanding on limited movement above the breast bone and is aware of her follow up appointments. Tele monitor has been remove and CCMD has been notified. Midline has been removed. Mother is at the bedside.

## 2020-03-01 NOTE — Discharge Summary (Signed)
Physician Discharge Summary  Felicia [name] OEH:[id number] DOB: 08/21/1961 DOA: 02/25/2020  PCP: [name], MD  Admit date: 02/25/2020 Discharge date: 03/01/2020  Admitted From: Home Disposition: Home  Recommendations for Outpatient Follow-up:  1. Follow up with PCP in 1-2 weeks 2. Please obtain BMP/CBC in one week your next doctors visit.  3. Discontinue Lasix, lisinopril, metoprolol, potassium supplements, tramadol  Discharge Condition: Stable CODE STATUS: Full code Diet recommendation: Heart healthy  Brief/Interim Summary: 59 year old female who presented after mechanical fall and syncope. She does have significant past medical history for coronary artery disease status post 5 vessel bypass February 21, 2020, she has type 2 diabetes mellitus, hypertension, and dyslipidemia. She was discharged from the hospital 2 days ago.Apparently patient was sitting watching TV, thenshe stood up to go to the bathroomwith the intention tomove her bowels, while walking she had a syncope episode with prodromaldizziness.Post trauma she had facial and left wrist pain.On her initial physical examination her blood pressure was 135/75, heart rate 85, respiratory rate 26, oxygen saturation 94%, her lungs are clear to auscultation bilaterally, heart S1-S2 present rhythm, abdomen was soft, no lower extremity edema, she had a left wrist splint in place.  For orthostatic hypotension patient received IV fluids.  Left heart catheterization was done which showed occluded 4/5 grafts, patent LIMA to LAD with preserved EF.  Beta-blocker, ACE inhibitor was discontinued.  Advised to continue full dose aspirin and lipid-lowering therapy.  Follow-up outpatient in 2-3 weeks.  Syncope, complicated by left radius/ulna fracture, left orbital wall, lateral maxillary wall,inferior orbital floor fracture. Limited echocardiogram with preserved LV systolic function, new LV apical anterior, lateral wall and apical segment  hypokinesis. Orthostatic hypotension has resolved:  Syncope has resolved.  She has outpatient follow-up with orthopedic tomorrow for fracture.  Currently has wrist splint in place.  She does not want any pain medications to go home on.  Coronary status post CABG, elevated troponins. Cardiac catheterization with early failure 4/5 bypass grafts due to a small target vessel size. Occluded graft to RCA. Recommendations for medical therapy and potential PCI to RCA in case of angina symptoms.  Continue full dose aspirin and statin.  No beta-blocker or ACE inhibitor at this time this can be resumed slowly outpatient per cardiology  Uncontrolled type 1 diabetes mellitus/dyslipidemia. Resume home medications  Essential hypertension. Blood pressure medications discontinued.  Closely monitor outpatient, beta-blockers can be slowly started outpatient    Discharge Diagnoses:  Principal Problem:   Syncope Active Problems:   S/P CABG (coronary artery bypass graft)   CAD (coronary artery disease)   Radius and ulna distal fracture   Orbital fracture (HCC)   Non-ST elevation (NSTEMI) myocardial infarction [facility])    Consultations:  Cardiology  Subjective: Overnight had episode of hypoglycemia but improved this morning.  Patient is adamant about going home and does not want any further intervention in the hospital.  She also does not want to take any insulin or pain medications.  Discharge Exam: Vitals:   03/01/20 0527 03/01/20 0914  BP: (!) 171/77 114/85  Pulse: 88 82  Resp: 19 20  Temp: 97.6 F (36.4 C)   SpO2: 95% 100%   Vitals:   02/29/20 2028 02/29/20 2322 03/01/20 0527 03/01/20 0914  BP: (!) 99/49 136/76 (!) 171/77 114/85  Pulse: 70 75 88 82  Resp: 19 18 19 20   Temp: 98.3 F (36.8 C) 98.5 F (36.9 C) 97.6 F (36.4 C)   TempSrc: Oral Oral Oral   SpO2: 95% 96% 95%  100%  Weight:   64.7 kg   Height:        General: Pt is alert, awake, not in acute  distress Cardiovascular: RRR, S1/S2 +, no rubs, no gallops Respiratory: CTA bilaterally, no wheezing, no rhonchi Abdominal: Soft, NT, ND, bowel sounds + Extremities: Left upper extremity splint noted Ecchymosis around her orbits noted. Surgical scar-midsternal area noted  Discharge Instructions  Discharge Instructions    Diet - low sodium heart healthy   Complete by: As directed    Discharge instructions   Complete by: As directed    Please hold on metoprolol, lisinopril and furosemide, until follow up with primary care in 7 days.   Increase activity slowly   Complete by: As directed      Allergies as of 03/01/2020      Reactions   Protonix [pantoprazole] Swelling   Facial Swelling   Toujeo Solostar [insulin Glargine] Shortness Of Breath   Lactose Intolerance (gi)    GI   Magnesium-containing Compounds Swelling, Other (See Comments)   limbs   Adhesive [tape] Rash, Other (See Comments)   Band aids      Medication List    STOP taking these medications   furosemide 40 MG tablet Commonly known as: LASIX   lisinopril 5 MG tablet Commonly known as: ZESTRIL   metoprolol tartrate 25 MG tablet Commonly known as: LOPRESSOR   potassium chloride SA 20 MEQ tablet Commonly known as: KLOR-CON   traMADol 50 MG tablet Commonly known as: ULTRAM     TAKE these medications   acetaminophen 325 MG tablet Commonly known as: TYLENOL Take 2 tablets (650 mg total) by mouth every 6 (six) hours as needed for mild pain.   aspirin 325 MG EC tablet Take 1 tablet (325 mg total) by mouth daily.   esomeprazole 40 MG capsule Commonly known as: NEXIUM Take 1 capsule (40 mg total) by mouth daily. Patient needs office visit for further refills   ezetimibe 10 MG tablet Commonly known as: ZETIA Take 1 tablet (10 mg total) by mouth daily.   glucagon 1 MG injection Inject 1 mg into the muscle once as needed (in case of severe hypoglycemia).   insulin regular 100 units/mL  injection Commonly known as: NovoLIN R INJECT 6 UNITS TOTAL INTO THE SKIN 3 (THREE) TIMES DAILY BEFORE MEALS. What changed:   how much to take  how to take this  when to take this  additional instructions   Insulin Syringe-Needle U-100 30G X 5/16" 0.3 ML Misc Use to inject insulin 6 times a day.   Lantus SoloStar 100 UNIT/ML Solostar Pen Generic drug: insulin glargine Inject 12 Units into the skin daily. What changed:   how much to take  when to take this  additional instructions   multivitamin tablet Take 1 tablet by mouth daily.   NovoFine 32G X 6 MM Misc Generic drug: Insulin Pen Needle USE TO INJECT INSULIN 4 TIMES A DAY   pravastatin 40 MG tablet Commonly known as: PRAVACHOL Take 1 tablet (40 mg total) by mouth at bedtime. What changed: See the new instructions.      Follow-up Information    Mack Hook, MD Follow up.   Specialty: Orthopedic Surgery Why: my office will contact you this week to set up an appointment for next week to re-check your wrist fracture Contact information: [address] [place] [address] [phone]        Antonieta Iba, MD Follow up.   Specialty: Cardiology Why: We  will arrange for heart monitor and contact you.  Keep current appts with Dr. Windell Hummingbird office. Contact information: [address] [place] [address] [phone]        [name], MD On 03/07/2020.   Specialty: Family Medicine Why: at 3:00pm Contact information: [address] [place] [address] [phone]          Allergies  Allergen Reactions  . Protonix [Pantoprazole] Swelling    Facial Swelling  . Toujeo Solostar [Insulin Glargine] Shortness Of Breath  . Lactose Intolerance (Gi)     GI  . Magnesium-Containing Compounds Swelling and Other (See Comments)    limbs  . Adhesive [Tape] Rash and Other (See Comments)    Band aids    You were cared for by a hospitalist during your hospital stay.  If you have any questions about your discharge medications or the care you received while you were in the hospital after you are discharged, you can call the unit and asked to speak with the hospitalist on call if the hospitalist that took care of you is not available. Once you are discharged, your primary care physician will handle any further medical issues. Please note that no refills for any discharge medications will be authorized once you are discharged, as it is imperative that you return to your primary care physician (or establish a relationship with a primary care physician if you do not have one) for your aftercare needs so that they can reassess your need for medications and monitor your lab values.   Procedures/Studies: DG Chest 2 View  Result Date: 02/07/2020 CLINICAL DATA:  Chest pain EXAM: CHEST - 2 VIEW COMPARISON:  April 21, 2015 FINDINGS: The heart size and mediastinal contours are within normal limits. Both lungs are clear. The visualized skeletal structures are unremarkable. IMPRESSION: No active cardiopulmonary disease. Electronically Signed   By: Jonna Clark M.D.   On: 02/07/2020 11:46   DG Wrist Complete Left  Result Date: 02/25/2020 CLINICAL DATA:  Fall EXAM: LEFT WRIST - COMPLETE 3+ VIEW COMPARISON:  None. FINDINGS: There is a slightly impacted nondisplaced distal radius fracture seen. A mildly displaced ulnar styloid fracture is noted. Diffuse overlying soft tissue swelling is seen. There is diffuse osteopenia noted. IMPRESSION: Slightly impacted nondisplaced distal radius fracture. Mildly displaced ulnar styloid fracture. Electronically Signed   By: Jonna Clark M.D.   On: 02/25/2020 23:40   CT HEAD WO CONTRAST  Result Date: 02/26/2020 CLINICAL DATA:  Headache, posttraumatic EXAM: CT HEAD WITHOUT CONTRAST TECHNIQUE: Contiguous axial images were obtained from the base of the skull through the vertex without intravenous contrast. COMPARISON:  None. FINDINGS: Brain: No evidence  of acute territorial infarction, hemorrhage, hydrocephalus,extra-axial collection or mass lesion/mass effect. Normal gray-white differentiation. Ventricles are normal in size and contour. Vascular: No hyperdense vessel or unexpected calcification. Skull: The skull is intact. There is a mildly displaced obliquely oriented fracture seen through the lateral orbital wall. There is a comminuted mildly impacted fracture seen through the lateral maxillary wall and inferior orbital floor extends through the infraorbital foramen. No displacement of the inferior orbital fat. There is small foci of air seen within the inferior orbit soft tissues. Sinuses/Orbits: The visualized paranasal sinuses and mastoid air cells are clear. The orbits and globes intact. Other: Right periorbital soft tissue swelling is seen. There is also soft tissue swelling seen over the right frontal skull. The orbits however appear to be intact. No retro-orbital fluid collections are noted. IMPRESSION: No  acute intracranial abnormality. Nondisplaced comminuted fractures through the lateral orbital wall, lateral maxillary wall common inferior orbital floor. No displacement of the inferior orbital fat. Soft tissue swelling seen over the right frontal skull and right periorbital soft tissues. Electronically Signed   By: Jonna Clark M.D.   On: 02/26/2020 01:03   CARDIAC CATHETERIZATION  Result Date: 02/29/2020  Prox LAD to Mid LAD lesion is 90% stenosed.  Ost RCA to Prox RCA lesion is 70% stenosed.  Mid RCA lesion is 70% stenosed.  RPDA lesion is 50% stenosed.  1st Diag lesion is 99% stenosed.  1st Mrg-1 lesion is 60% stenosed.  1st Mrg-2 lesion is 80% stenosed.  Mid LAD lesion is 50% stenosed.  LIMA graft was visualized by angiography and is normal in caliber.  SVG graft was visualized by angiography.  Origin to Prox Graft lesion is 100% stenosed.  Origin lesion is 100% stenosed.  Origin to Prox Graft lesion before RPAV is 100% stenosed.   1. Severe triple vessel CAD s/p 5V CABG with 1/5 patent bypass grafts 2. Severe stenosis mid LAD. Patent LIMA to LAD 3. Severe stenosis in the small caliber Diagonal branch. The vein graft to this vessel is occluded 4. Severe stenosis in the small caliber obtuse marginal branch. The vein graft to this vessel is occluded 5. Moderate ostial RCA stenosis. Severe mid RCA stenosis. The Y graft to the PDA and posterolateral artery appears to be occluded but there is competitive flow into the posterolateral artery. Recommendations; She has early failure of 4/5 bypass grafts. This is likely due to the small target vessel size. The LIMA to the LAD remains patent. Her imaging suggests washout of the posterolateral artery from an apparent patent bypass graft however the graft to the RCA is clearly occluded by angiography. This is described in the operative report as a Y graft. It is possible that a segment of this graft remains open although it cannot be seen today. There is an unusual appearance of the anastomosis of the SVG to the OM. No competitive filling is seen into this target vessel. I would continue medical therapy for now. We can consider PCI/stenting of the RCA if there is recurrent angina. This would require stenting from the ostium of the RCA into the distal vessel.  As above, there is apparent competitive filling of the posterolateral artery from a bypass graft that was not found or left to right collaterals which are not seen well on angiography of the left system.   CARDIAC CATHETERIZATION  Result Date: 02/17/2020  1st Mrg lesion is 60% stenosed.  Prox LAD to Mid LAD lesion is 90% stenosed.  Mid LAD-1 lesion is 80% stenosed.  Mid LAD-2 lesion is 50% stenosed.  1st Diag-1 lesion is 95% stenosed.  1st Diag-2 lesion is 70% stenosed.  Ost RCA to Prox RCA lesion is 70% stenosed.  Mid RCA lesion is 70% stenosed.  RPDA lesion is 50% stenosed.  The left ventricular ejection fraction is greater than 65% by  visual estimate.  LV end diastolic pressure is normal.  The left ventricular systolic function is normal.  There is no mitral valve regurgitation.    DG Chest Port 1 View  Result Date: 02/25/2020 CLINICAL DATA:  Larey Seat, syncope, recent CABG EXAM: PORTABLE CHEST 1 VIEW COMPARISON:  02/23/2020 FINDINGS: Single frontal view of the chest demonstrates stable postsurgical changes from bypass surgery. The cardiac silhouette is stable. Improved volume status, with resolution of airspace disease and effusions seen previously. Minimal scarring  or atelectasis at the left base. No pneumothorax. No acute bony abnormalities. IMPRESSION: 1. Minimal left basilar atelectasis. 2. No acute intrathoracic process. Electronically Signed   By: Sharlet [name] M.D.   On: 02/25/2020 23:41   DG Chest Port 1 View  Result Date: 02/23/2020 CLINICAL DATA:  Bypass surgery. EXAM: PORTABLE CHEST 1 VIEW COMPARISON:  02/22/2020 FINDINGS: The right IJ catheter is stable. The left chest tube and mediastinal drain tubes are stable. No pneumothorax. Persistent bibasilar atelectasis but no pleural effusions or pulmonary edema. IMPRESSION: 1. Stable support apparatus. 2. Bibasilar atelectasis. Electronically Signed   By: Rudie Meyer M.D.   On: 02/23/2020 09:04   DG Chest Port 1 View  Result Date: 02/22/2020 CLINICAL DATA:  Status post CABG. Chest tubes in place. EXAM: PORTABLE CHEST 1 VIEW COMPARISON:  02/21/2020 FINDINGS: The endotracheal tube and NG tube have been removed. Chest tubes and central venous catheter remain in place, unchanged. No pneumothorax. Slight haziness at both lung bases probably represents tiny effusions. CABG. The cardiac silhouette and pulmonary vascularity are normal. No bone abnormality. IMPRESSION: Slight bibasilar  effusions. No pneumothorax. Electronically Signed   By: Francene Boyers M.D.   On: 02/22/2020 09:25   DG Chest Port 1 View  Result Date: 02/21/2020 CLINICAL DATA:  Status post CABG. EXAM: PORTABLE  CHEST 1 VIEW COMPARISON:  One-view chest x-ray 02/21/2020 at 6:26 a.m. FINDINGS: Patient is now status post CABG for median sternotomy. Graft markers are noted. Endotracheal tube terminates 3 cm above the carina. Side port of the NG tube is in the stomach. Mediastinal drains and left pleural drain is present. No pneumothorax is present. Right IJ catheter terminates at the cavoatrial junction. Lung volumes are low with moderate pulmonary vascular congestion. No significant effusions are present. IMPRESSION: 1. Status post CABG for median sternotomy without radiographic evidence for complication. 2. Moderate pulmonary vascular congestion. 3. The support apparatus is satisfactory. Electronically Signed   By: Marin Roberts M.D.   On: 02/21/2020 15:16   DG CHEST PORT 1 VIEW  Result Date: 02/21/2020 CLINICAL DATA:  59 year old female preoperative study for CABG. EXAM: PORTABLE CHEST 1 VIEW COMPARISON:  Chest radiographs 02/07/2020 and earlier. FINDINGS: Portable AP semi upright view at 0627 hours. Lung volumes and mediastinal contours remain normal. Visualized tracheal air column is within normal limits. Allowing for portable technique the lungs are clear. Negative visible bowel gas and osseous structures. IMPRESSION: Negative portable chest. Electronically Signed   By: Odessa Fleming M.D.   On: 02/21/2020 08:38   ECHOCARDIOGRAM COMPLETE  Result Date: 02/17/2020    ECHOCARDIOGRAM REPORT   Patient Name:   Felicia [name] Date of Exam: 02/17/2020 Medical Rec #:  [id number]      Height:       64.0 in Accession #:    [id number]     Weight:       136.0 lb Date of Birth:  January 24, 1961      BSA:          1.661 m Patient Age:    59 years       BP:           185/81 mmHg Patient Gender: F              HR:           82 bpm. Exam Location:  Inpatient Procedure: 2D Echo Indications:    coronary artery disease.  History:        Patient has prior history of  Echocardiogram examinations, most                 recent 01/19/2013.  Signs/Symptoms:Chest Pain; Risk                 Factors:Hypertension, Diabetes and Dyslipidemia.  Sonographer:    Delcie Roch Referring Phys: [id number] HARRELL O LIGHTFOOT IMPRESSIONS  1. Left ventricular ejection fraction, by estimation, is 65 to 70%. The left ventricle has normal function. The left ventricle has no regional wall motion abnormalities. Left ventricular diastolic parameters are consistent with Grade I diastolic dysfunction (impaired relaxation).  2. Right ventricular systolic function is normal. The right ventricular size is normal.  3. The mitral valve is normal in structure. Mild mitral valve regurgitation. No evidence of mitral stenosis.  4. The aortic valve is normal in structure. Aortic valve regurgitation is not visualized. Mild to moderate aortic valve sclerosis/calcification is present, without any evidence of aortic stenosis.  5. The inferior vena cava is normal in size with greater than 50% respiratory variability, suggesting right atrial pressure of 3 mmHg. FINDINGS  Left Ventricle: Left ventricular ejection fraction, by estimation, is 65 to 70%. The left ventricle has normal function. The left ventricle has no regional wall motion abnormalities. The left ventricular internal cavity size was normal in size. There is  no left ventricular hypertrophy. Left ventricular diastolic parameters are consistent with Grade I diastolic dysfunction (impaired relaxation). Right Ventricle: The right ventricular size is normal. No increase in right ventricular wall thickness. Right ventricular systolic function is normal. Left Atrium: Left atrial size was normal in size. Right Atrium: Right atrial size was normal in size. Pericardium: There is no evidence of pericardial effusion. Mitral Valve: The mitral valve is normal in structure. Normal mobility of the mitral valve leaflets. Mild mitral valve regurgitation. No evidence of mitral valve stenosis. Tricuspid Valve: The tricuspid valve is normal in  structure. Tricuspid valve regurgitation is trivial. No evidence of tricuspid stenosis. Aortic Valve: The aortic valve is normal in structure. Aortic valve regurgitation is not visualized. Mild to moderate aortic valve sclerosis/calcification is present, without any evidence of aortic stenosis. Pulmonic Valve: The pulmonic valve was normal in structure. Pulmonic valve regurgitation is not visualized. No evidence of pulmonic stenosis. Aorta: The aortic root is normal in size and structure. Venous: The inferior vena cava is normal in size with greater than 50% respiratory variability, suggesting right atrial pressure of 3 mmHg. IAS/Shunts: No atrial level shunt detected by color flow Doppler.  LEFT VENTRICLE PLAX 2D LVIDd:         3.70 cm  Diastology LVIDs:         2.40 cm  LV e' lateral:   11.30 cm/s LV PW:         0.70 cm  LV E/e' lateral: 11.0 LV IVS:        0.80 cm  LV e' medial:    8.38 cm/s LVOT diam:     1.90 cm  LV E/e' medial:  14.8 LV SV:         62 LV SV Index:   37 LVOT Area:     2.84 cm  RIGHT VENTRICLE             IVC RV S prime:     16.40 cm/s  IVC diam: 1.20 cm TAPSE (M-mode): 2.6 cm LEFT ATRIUM             Index LA diam:        3.00 cm 1.81  cm/m LA Vol (A2C):   29.6 ml 17.82 ml/m LA Vol (A4C):   28.4 ml 17.10 ml/m LA Biplane Vol: 29.2 ml 17.58 ml/m  AORTIC VALVE LVOT Vmax:   114.00 cm/s LVOT Vmean:  72.100 cm/s LVOT VTI:    0.219 m  AORTA Ao Root diam: 2.60 cm MITRAL VALVE MV Area (PHT): 3.63 cm     SHUNTS MV Decel Time: 209 msec     Systemic VTI:  0.22 m MV E velocity: 124.00 cm/s  Systemic Diam: 1.90 cm MV A velocity: 105.00 cm/s MV E/A ratio:  1.18 Ena Dawley MD Electronically signed by Ena Dawley MD Signature Date/Time: 02/17/2020/6:14:03 PM    Final    ECHO INTRAOPERATIVE TEE  Result Date: 02/21/2020  *INTRAOPERATIVE TRANSESOPHAGEAL REPORT *  Patient Name:   Felicia [name] Date of Exam: 02/21/2020 Medical Rec #:  [id number]      Height:       64.0 in Accession #:    [id number]      Weight:       132.5 lb Date of Birth:  August 08, 1961      BSA:          1.64 m Patient Age:    75 years       BP:           140/72 mmHg Patient Gender: F              HR:           63 bpm. Exam Location:  Anesthesiology Transesophogeal exam was perform intraoperatively during surgical procedure. Patient was closely monitored under general anesthesia during the entirety of examination. Indications:     Coronary artery disease Performing Phys: Adele Barthel MD Diagnosing Phys: Adele Barthel MD Complications: No known complications during this procedure. POST-OP IMPRESSIONS Overall, there were no significant changes from pre-bypass. - Left Ventricle: The left ventricle is unchanged from pre-bypass. - Right Ventricle: The right ventricle appears unchanged from pre-bypass. - Aorta: The aorta appears unchanged from pre-bypass. - Left Atrium: The left atrium appears unchanged from pre-bypass. - Left Atrial Appendage: The left atrial appendage appears unchanged from pre-bypass. - Aortic Valve: The aortic valve appears unchanged from pre-bypass. - Mitral Valve: There is trace regurgitation. - Tricuspid Valve: The tricuspid valve appears unchanged from pre-bypass. - Interatrial Septum: The interatrial septum appears unchanged from pre-bypass. - Interventricular Septum: The interventricular septum appears unchanged from pre-bypass. - Pericardium: The pericardium appears unchanged from pre-bypass. PRE-OP FINDINGS  Left Ventricle: The left ventricle has hyperdynamic systolic function, with an ejection fraction of >65%. The cavity size was normal. There is no increase in left ventricular wall thickness. Right Ventricle: The right ventricle has normal systolic function. The cavity was normal. There is no increase in right ventricular wall thickness. Left Atrium: Left atrial size was normal in size. Right Atrium: Right atrial size was normal in size. Interatrial Septum: No atrial level shunt detected by color flow Doppler.  Pericardium: There is no evidence of pericardial effusion. Mitral Valve: The mitral valve is normal in structure. Mitral valve regurgitation is not visualized by color flow Doppler. Tricuspid Valve: The tricuspid valve was normal in structure. Tricuspid valve regurgitation was not visualized by color flow Doppler. Aortic Valve: The aortic valve is normal in structure. There is mild calcification on the non-coronary cusp of the aortic valve. Aortic valve regurgitation was not visualized by color flow Doppler. There is no evidence of aortic valve stenosis. Pulmonic Valve: The pulmonic valve was normal  in structure. Pulmonic valve regurgitation is not visualized by color flow Doppler. Aorta: There is evidence of plaque in the descending aorta; Grade I, measuring 1-37mm in size. +--------------+-------++ LEFT VENTRICLE        +--------------+-------++ PLAX 2D               +--------------+-------++ LVIDd:        3.57 cm +--------------+-------++ LVIDs:        2.24 cm +--------------+-------++ LV PW:        0.75 cm +--------------+-------++ LV IVS:       0.64 cm +--------------+-------++ LV SV:        36 ml   +--------------+-------++ LV SV Index:  22.02   +--------------+-------++                       +--------------+-------++ +-------------+-----------++ AORTIC VALVE             +-------------+-----------++ AV Vmax:     110.00 cm/s +-------------+-----------++ AV Vmean:    78.400 cm/s +-------------+-----------++ AV VTI:      0.277 m     +-------------+-----------++ AV Peak Grad:4.8 mmHg    +-------------+-----------++ AV Mean Grad:3.0 mmHg    +-------------+-----------++  +------------+-------++ AORTA               +------------+-------++ Ao Asc diam:2.80 cm +------------+-------++  Karna Christmas MD Electronically signed by Karna Christmas MD Signature Date/Time: 02/21/2020/2:57:24 PM    Final    VAS US DOPPLER PRE CABG  Result Date:  02/18/2020 PREOPERATIVE VASCULAR EVALUATION  Indications:      Pre-CABG. Risk Factors:     Hypertension, Diabetes, coronary artery disease. Comparison Study: no prior Performing Technologist: Blanch Media RVS  Examination Guidelines: A complete evaluation includes B-mode imaging, spectral Doppler, color Doppler, and power Doppler as needed of all accessible portions of each vessel. Bilateral testing is considered an integral part of a complete examination. Limited examinations for reoccurring indications may be performed as noted.  Right Carotid Findings: +----------+--------+--------+--------+------------+--------+           PSV cm/sEDV cm/sStenosisDescribe    Comments +----------+--------+--------+--------+------------+--------+ CCA Prox  86      17              heterogenous         +----------+--------+--------+--------+------------+--------+ CCA Distal58      19              heterogenous         +----------+--------+--------+--------+------------+--------+ ICA Prox  57      18      1-39%   heterogenous         +----------+--------+--------+--------+------------+--------+ ICA Distal49      19                                   +----------+--------+--------+--------+------------+--------+ ECA       80      10                                   +----------+--------+--------+--------+------------+--------+ Portions of this table do not appear on this page. +----------+--------+-------+--------+------------+           PSV cm/sEDV cmsDescribeArm Pressure +----------+--------+-------+--------+------------+ Subclavian78                                  +----------+--------+-------+--------+------------+ +---------+--------+--+--------+-+---------+  VertebralPSV cm/s36EDV cm/s9Antegrade +---------+--------+--+--------+-+---------+ Left Carotid Findings: +----------+--------+--------+--------+------------+--------+           PSV cm/sEDV cm/sStenosisDescribe     Comments +----------+--------+--------+--------+------------+--------+ CCA Prox  98      24              heterogenous         +----------+--------+--------+--------+------------+--------+ CCA Distal55      19              heterogenous         +----------+--------+--------+--------+------------+--------+ ICA Prox  59      19      1-39%   heterogenous         +----------+--------+--------+--------+------------+--------+ ICA Distal81      22                                   +----------+--------+--------+--------+------------+--------+ ECA       64      10                                   +----------+--------+--------+--------+------------+--------+ +----------+--------+--------+--------+------------+ SubclavianPSV cm/sEDV cm/sDescribeArm Pressure +----------+--------+--------+--------+------------+           96                      145          +----------+--------+--------+--------+------------+ +---------+--------+--+--------+--+---------+ VertebralPSV cm/s40EDV cm/s12Antegrade +---------+--------+--+--------+--+---------+  ABI Findings: +--------+------------------+-----+---------+--------+ Right   Rt Pressure (mmHg)IndexWaveform Comment  +--------+------------------+-----+---------+--------+ Brachial                       triphasiciv       +--------+------------------+-----+---------+--------+ PTA     146               1.01 triphasic         +--------+------------------+-----+---------+--------+ DP      156               1.08 triphasic         +--------+------------------+-----+---------+--------+ +--------+------------------+-----+---------+-------+ Left    Lt Pressure (mmHg)IndexWaveform Comment +--------+------------------+-----+---------+-------+ [id number]                    triphasic        +--------+------------------+-----+---------+-------+ PTA     131               0.90 triphasic         +--------+------------------+-----+---------+-------+ DP      154               1.06 triphasic        +--------+------------------+-----+---------+-------+ +-------+---------------+----------------+ ABI/TBIToday's ABI/TBIPrevious ABI/TBI +-------+---------------+----------------+ Right  1.08                            +-------+---------------+----------------+ Left   1.06                            +-------+---------------+----------------+  Right Doppler Findings: +--------+--------+-----+---------+--------+ Site    PressureIndexDoppler  Comments +--------+--------+-----+---------+--------+ Brachial             triphasiciv       +--------+--------+-----+---------+--------+ Radial               triphasic         +--------+--------+-----+---------+--------+  Ulnar                triphasic         +--------+--------+-----+---------+--------+  Left Doppler Findings: +--------+--------+-----+---------+--------+ Site    PressureIndexDoppler  Comments +--------+--------+-----+---------+--------+ [id number]          triphasic         +--------+--------+-----+---------+--------+ Radial               triphasic         +--------+--------+-----+---------+--------+ Ulnar                triphasic         +--------+--------+-----+---------+--------+  Summary: Right Carotid: Velocities in the right ICA are consistent with a 1-39% stenosis. Left Carotid: Velocities in the left ICA are consistent with a 1-39% stenosis. Vertebrals: Bilateral vertebral arteries demonstrate antegrade flow. Right ABI: Resting right ankle-brachial index is within normal range. No evidence of significant right lower extremity arterial disease. Left ABI: Resting left ankle-brachial index is within normal range. No evidence of significant left lower extremity arterial disease. Right Upper Extremity: Doppler waveforms remain within normal limits with right radial compression. Doppler waveforms  remain within normal limits with right ulnar compression. Left Upper Extremity: Doppler waveform obliterate with left radial compression. Doppler waveforms remain within normal limits with left ulnar compression.  Electronically signed by Sherald Hess MD on 02/18/2020 at 4:04:18 PM.    Final    ECHOCARDIOGRAM LIMITED  Result Date: 02/27/2020    ECHOCARDIOGRAM LIMITED REPORT   Patient Name:   Felicia [name] Date of Exam: 02/27/2020 Medical Rec #:  [id number]      Height:       64.0 in Accession #:    [id number]     Weight:       136.5 lb Date of Birth:  1960/11/08      BSA:          1.663 m Patient Age:    59 years       BP:           128/111 mmHg Patient Gender: F              HR:           80 bpm. Exam Location:  Inpatient Procedure: Limited Echo Indications:    Elevated troponin  History:        Patient has prior history of Echocardiogram examinations, most                 recent 02/17/2020. CAD, Prior CABG, Signs/Symptoms:Syncope and                 Chest Pain; Risk Factors:Hypertension, Diabetes, Dyslipidemia                 and Non-Smoker.  Sonographer:    Renella Cunas RDCS Referring Phys: [id number] Lynda Rainwater A ACHARYA IMPRESSIONS  1. Left ventricular ejection fraction, by estimation, is 55 to 60%. The left ventricle has normal function. The left ventricle demonstrates regional wall motion abnormalities (see scoring diagram/findings for description). Left ventricular diastolic function could not be evaluated. There is moderate hypokinesis of the left ventricular, apical anterior wall, lateral wall and apical segment.  2. Right ventricular systolic function is normal. The right ventricular size is normal.  3. The mitral valve is normal in structure. No evidence of mitral stenosis.  4. The aortic valve is normal in structure. Aortic valve regurgitation is not visualized. No aortic stenosis is present.  5. The  inferior vena cava is normal in size with greater than 50% respiratory variability, suggesting right  atrial pressure of 3 mmHg. Comparison(s): Prior images reviewed side by side. Changes from prior study are noted. The left ventricular wall motion abnormality is new, but overall left ventricular function remains normal. FINDINGS  Left Ventricle: Left ventricular ejection fraction, by estimation, is 55 to 60%. The left ventricle has normal function. The left ventricle demonstrates regional wall motion abnormalities. Moderate hypokinesis of the left ventricular, apical anterior wall, lateral wall and apical segment. The left ventricular internal cavity size was normal in size. There is no left ventricular hypertrophy.  LV Wall Scoring: The apical lateral segment, apical anterior segment, and apex are hypokinetic. Right Ventricle: The right ventricular size is normal. No increase in right ventricular wall thickness. Right ventricular systolic function is normal. Left Atrium: Left atrial size was normal in size. Right Atrium: Right atrial size was normal in size. Pericardium: There is no evidence of pericardial effusion. Mitral Valve: The mitral valve is normal in structure. Normal mobility of the mitral valve leaflets. No evidence of mitral valve stenosis. Tricuspid Valve: The tricuspid valve is normal in structure. No evidence of tricuspid stenosis. Aortic Valve: The aortic valve is normal in structure. Aortic valve regurgitation is not visualized. No aortic stenosis is present. Pulmonic Valve: The pulmonic valve was grossly normal. No evidence of pulmonic stenosis. Aorta: The aortic root and ascending aorta are structurally normal, with no evidence of dilitation. Venous: The inferior vena cava is normal in size with greater than 50% respiratory variability, suggesting right atrial pressure of 3 mmHg. IAS/Shunts: The interatrial septum was not assessed. Thurmon FairMihai Croitoru MD Electronically signed by Thurmon FairMihai Croitoru MD Signature Date/Time: 02/27/2020/11:47:43 AM    Final       The results of significant diagnostics  from this hospitalization (including imaging, microbiology, ancillary and laboratory) are listed below for reference.     Microbiology: Recent Results (from the past 240 hour(s))  Respiratory Panel by RT PCR (Flu A&B, Covid) - Nasopharyngeal Swab     Status: None   Collection Time: 02/26/20  4:04 AM   Specimen: Nasopharyngeal Swab  Result Value Ref Range Status   SARS Coronavirus 2 by RT PCR NEGATIVE NEGATIVE Final    Comment: (NOTE) SARS-CoV-2 target nucleic acids are NOT DETECTED. The SARS-CoV-2 RNA is generally detectable in upper respiratoy specimens during the acute phase of infection. The lowest concentration of SARS-CoV-2 viral copies this assay can detect is 131 copies/mL. A negative result does not preclude SARS-Cov-2 infection and should not be used as the sole basis for treatment or other patient management decisions. A negative result may occur with  improper specimen collection/handling, submission of specimen other than nasopharyngeal swab, presence of viral mutation(s) within the areas targeted by this assay, and inadequate number of viral copies (<131 copies/mL). A negative result must be combined with clinical observations, patient history, and epidemiological information. The expected result is Negative. Fact Sheet for Patients:  https://www.moore.com/https://www.fda.gov/media/142436/download Fact Sheet for Healthcare Providers:  https://www.young.biz/https://www.fda.gov/media/142435/download This test is not yet ap proved or cleared by the [place] FDA and  has been authorized for detection and/or diagnosis of SARS-CoV-2 by FDA under an Emergency Use Authorization (EUA). This EUA will remain  in effect (meaning this test can be used) for the duration of the COVID-19 declaration under Section 564(b)(1) of the Act, 21 U.S.C. section 360bbb-3(b)(1), unless the authorization is terminated or revoked sooner.    Influenza A by PCR NEGATIVE NEGATIVE Final  Influenza B by PCR NEGATIVE NEGATIVE Final     Comment: (NOTE) The Xpert Xpress SARS-CoV-2/FLU/RSV assay is intended as an aid in  the diagnosis of influenza from Nasopharyngeal swab specimens and  should not be used as a sole basis for treatment. Nasal washings and  aspirates are unacceptable for Xpert Xpress SARS-CoV-2/FLU/RSV  testing. Fact Sheet for Patients: https://www.moore.com/ Fact Sheet for Healthcare Providers: https://www.young.biz/ This test is not yet approved or cleared by the [place] FDA and  has been authorized for detection and/or diagnosis of SARS-CoV-2 by  FDA under an Emergency Use Authorization (EUA). This EUA will remain  in effect (meaning this test can be used) for the duration of the  Covid-19 declaration under Section 564(b)(1) of the Act, 21  U.S.C. section 360bbb-3(b)(1), unless the authorization is  terminated or revoked. Performed at [facility], [address], [place], [place] [address]      Labs: BNP (last 3 results) Recent Labs    02/26/20 0008  BNP 598.5*   Basic Metabolic Panel: Recent Labs  Lab 02/24/20 0420 02/26/20 0007 02/27/20 0551 03/01/20 0652  NA 136 138 138 139  K 4.1 4.0 4.1 4.7  CL 102 95* 98 104  CO2 23 29 31 23   GLUCOSE 188* 144* 192* 124*  BUN 20 23* 22* 11  CREATININE 1.10* 1.16* 1.10* 0.97  CALCIUM 8.4* 9.3 8.5* 8.5*   Liver Function Tests: No results for input(s): AST, ALT, ALKPHOS, BILITOT, PROT, ALBUMIN in the last 168 hours. No results for input(s): LIPASE, AMYLASE in the last 168 hours. No results for input(s): AMMONIA in the last 168 hours. CBC: Recent Labs  Lab 02/24/20 0420 02/26/20 0007 02/27/20 0540 03/01/20 0652  WBC 8.2 9.5 9.1 8.3  NEUTROABS  --   --  4.9  --   HGB 11.4* 13.4 13.4 12.7  HCT 35.1* 41.2 41.5 40.6  MCV 89.5 89.0 91.0 93.8  PLT 124* 247 279 338   Cardiac Enzymes: No results for input(s): CKTOTAL, CKMB, CKMBINDEX, TROPONINI in the last 168 hours. BNP: Invalid input(s):  POCBNP CBG: Recent Labs  Lab 02/29/20 1710 02/29/20 2151 03/01/20 0603 03/01/20 0639 03/01/20 0911  GLUCAP 83 102* 49* 117* 122*   D-Dimer No results for input(s): DDIMER in the last 72 hours. Hgb A1c No results for input(s): HGBA1C in the last 72 hours. Lipid Profile No results for input(s): CHOL, HDL, LDLCALC, TRIG, CHOLHDL, LDLDIRECT in the last 72 hours. Thyroid function studies No results for input(s): TSH, T4TOTAL, T3FREE, THYROIDAB in the last 72 hours.  Invalid input(s): FREET3 Anemia work up No results for input(s): VITAMINB12, FOLATE, FERRITIN, TIBC, IRON, RETICCTPCT in the last 72 hours. Urinalysis    Component Value Date/Time   COLORURINE YELLOW 02/19/2020 2045   APPEARANCEUR CLEAR 02/19/2020 2045   LABSPEC 1.016 02/19/2020 2045   PHURINE 5.0 02/19/2020 2045   GLUCOSEU NEGATIVE 02/19/2020 2045   HGBUR NEGATIVE 02/19/2020 2045   BILIRUBINUR NEGATIVE 02/19/2020 2045   BILIRUBINUR Negative 07/25/2011 0818   KETONESUR NEGATIVE 02/19/2020 2045   PROTEINUR NEGATIVE 02/19/2020 2045   UROBILINOGEN 1.0 09/01/2015 1937   NITRITE NEGATIVE 02/19/2020 2045   LEUKOCYTESUR NEGATIVE 02/19/2020 2045   Sepsis Labs Invalid input(s): PROCALCITONIN,  WBC,  LACTICIDVEN Microbiology Recent Results (from the past 240 hour(s))  Respiratory Panel by RT PCR (Flu A&B, Covid) - Nasopharyngeal Swab     Status: None   Collection Time: 02/26/20  4:04 AM   Specimen: Nasopharyngeal Swab  Result Value Ref Range  Status   SARS Coronavirus 2 by RT PCR NEGATIVE NEGATIVE Final    Comment: (NOTE) SARS-CoV-2 target nucleic acids are NOT DETECTED. The SARS-CoV-2 RNA is generally detectable in upper respiratoy specimens during the acute phase of infection. The lowest concentration of SARS-CoV-2 viral copies this assay can detect is 131 copies/mL. A negative result does not preclude SARS-Cov-2 infection and should not be used as the sole basis for treatment or other patient management  decisions. A negative result may occur with  improper specimen collection/handling, submission of specimen other than nasopharyngeal swab, presence of viral mutation(s) within the areas targeted by this assay, and inadequate number of viral copies (<131 copies/mL). A negative result must be combined with clinical observations, patient history, and epidemiological information. The expected result is Negative. Fact Sheet for Patients:  https://www.moore.com/ Fact Sheet for Healthcare Providers:  https://www.young.biz/ This test is not yet ap proved or cleared by the [place] FDA and  has been authorized for detection and/or diagnosis of SARS-CoV-2 by FDA under an Emergency Use Authorization (EUA). This EUA will remain  in effect (meaning this test can be used) for the duration of the COVID-19 declaration under Section 564(b)(1) of the Act, 21 U.S.C. section 360bbb-3(b)(1), unless the authorization is terminated or revoked sooner.    Influenza A by PCR NEGATIVE NEGATIVE Final   Influenza B by PCR NEGATIVE NEGATIVE Final    Comment: (NOTE) The Xpert Xpress SARS-CoV-2/FLU/RSV assay is intended as an aid in  the diagnosis of influenza from Nasopharyngeal swab specimens and  should not be used as a sole basis for treatment. Nasal washings and  aspirates are unacceptable for Xpert Xpress SARS-CoV-2/FLU/RSV  testing. Fact Sheet for Patients: https://www.moore.com/ Fact Sheet for Healthcare Providers: https://www.young.biz/ This test is not yet approved or cleared by the [place] FDA and  has been authorized for detection and/or diagnosis of SARS-CoV-2 by  FDA under an Emergency Use Authorization (EUA). This EUA will remain  in effect (meaning this test can be used) for the duration of the  Covid-19 declaration under Section 564(b)(1) of the Act, 21  U.S.C. section 360bbb-3(b)(1), unless the authorization  is  terminated or revoked. Performed at [facility], [address], [place], [place] [address]      Time coordinating discharge:  I have spent 35 minutes face to face with the patient and on the ward discussing the patients care, assessment, plan and disposition with other care givers. >50% of the time was devoted counseling the patient about the risks and benefits of treatment/Discharge disposition and coordinating care.   SIGNED:   Dimple Nanas, MD  [facility] 03/01/2020, 11:43 AM   If 7PM-7AM, please contact night-coverage

## 2020-03-01 NOTE — Progress Notes (Signed)
Progress Note  Patient Name: Felicia [name] Date of Encounter: 03/01/2020  Primary Cardiologist: Julien Nordmann, MD   Subjective   She is feeling better.  Was able to walk inside the room without angina, dyspnea or dizziness.  Got up fairly fast to use the bedside commode without dizziness or vertigo. Note that her weight is up about 6-8 pounds since admission, without having signs of volume excess.  Suspect that she was hypovolemic when she had her syncopal event. I reviewed the findings of the angiogram at length with Gavin Pound, her husband and her mother yesterday.  We discussed the implications of this new diagnosis and plans to treat.  Inpatient Medications    Scheduled Meds: . aspirin  81 mg Oral Daily  . enoxaparin (LOVENOX) injection  40 mg Subcutaneous Q24H  . ezetimibe  10 mg Oral Daily  . insulin aspart  0-15 Units Subcutaneous TID WC  . insulin glargine  10 Units Subcutaneous Daily   And  . insulin glargine  8 Units Subcutaneous QHS  . multivitamin with minerals  1 tablet Oral Daily  . pravastatin  40 mg Oral QHS  . sodium chloride flush  10-40 mL Intracatheter Q12H  . sodium chloride flush  3 mL Intravenous Q12H   Continuous Infusions: . sodium chloride     PRN Meds: sodium chloride, acetaminophen **OR** acetaminophen, sodium chloride flush, sodium chloride flush   Vital Signs    Vitals:   02/29/20 2028 02/29/20 2322 03/01/20 0527 03/01/20 0914  BP: (!) 99/49 136/76 (!) 171/77 114/85  Pulse: 70 75 88 82  Resp: 19 18 19 20   Temp: 98.3 F (36.8 C) 98.5 F (36.9 C) 97.6 F (36.4 C)   TempSrc: Oral Oral Oral   SpO2: 95% 96% 95% 100%  Weight:   64.7 kg   Height:        Intake/Output Summary (Last 24 hours) at 03/01/2020 0937 Last data filed at 03/01/2020 0000 Gross per 24 hour  Intake 1352.43 ml  Output --  Net 1352.43 ml   Last 3 Weights 03/01/2020 02/29/2020 02/28/2020  Weight (lbs) 142 lb 11.2 oz 143 lb 138 lb 10.7 oz  Weight (kg) 64.728 kg 64.864 kg  62.9 kg      Telemetry    Sinus rhythm- Personally Reviewed  ECG    No new tracing- Personally Reviewed  Physical Exam  Appears comfortable lying fully supine in bed GEN: No acute distress.  Developing right periorbital ecchymosis Neck: No JVD Cardiac: RRR, no murmurs, rubs, or gallops.  Respiratory: Clear to auscultation bilaterally. GI: Soft, nontender, non-distended  MS: No edema; No deformity.  The right groin femoral catheter access site looks healthy (Mynx).  No hematoma or ecchymosis. Neuro:  Nonfocal  Psych: Normal affect   Labs    High Sensitivity Troponin:   Recent Labs  Lab 02/26/20 0404 02/26/20 0715 02/26/20 1214 02/26/20 1706 02/26/20 1957  TROPONINIHS 3,850* 2,678* 2,865* 4,160* 5,926*      Chemistry Recent Labs  Lab 02/26/20 0007 02/27/20 0551 03/01/20 0652  NA 138 138 139  K 4.0 4.1 4.7  CL 95* 98 104  CO2 29 31 23   GLUCOSE 144* 192* 124*  BUN 23* 22* 11  CREATININE 1.16* 1.10* 0.97  CALCIUM 9.3 8.5* 8.5*  GFRNONAA 52* 56* >60  GFRAA >60 >60 >60  ANIONGAP 14 9 12      Hematology Recent Labs  Lab 02/26/20 0007 02/27/20 0540 03/01/20 0652  WBC 9.5 9.1 8.3  RBC 4.63 4.56  4.33  HGB 13.4 13.4 12.7  HCT 41.2 41.5 40.6  MCV 89.0 91.0 93.8  MCH 28.9 29.4 29.3  MCHC 32.5 32.3 31.3  RDW 13.3 13.4 13.4  PLT 247 279 338    BNP Recent Labs  Lab 02/26/20 0008  BNP 598.5*     DDimer No results for input(s): DDIMER in the last 168 hours.   Radiology    CARDIAC CATHETERIZATION  Result Date: 02/29/2020  Prox LAD to Mid LAD lesion is 90% stenosed.  Ost RCA to Prox RCA lesion is 70% stenosed.  Mid RCA lesion is 70% stenosed.  RPDA lesion is 50% stenosed.  1st Diag lesion is 99% stenosed.  1st Mrg-1 lesion is 60% stenosed.  1st Mrg-2 lesion is 80% stenosed.  Mid LAD lesion is 50% stenosed.  LIMA graft was visualized by angiography and is normal in caliber.  SVG graft was visualized by angiography.  Origin to Prox Graft lesion  is 100% stenosed.  Origin lesion is 100% stenosed.  Origin to Prox Graft lesion before RPAV is 100% stenosed.  1. Severe triple vessel CAD s/p 5V CABG with 1/5 patent bypass grafts 2. Severe stenosis mid LAD. Patent LIMA to LAD 3. Severe stenosis in the small caliber Diagonal branch. The vein graft to this vessel is occluded 4. Severe stenosis in the small caliber obtuse marginal branch. The vein graft to this vessel is occluded 5. Moderate ostial RCA stenosis. Severe mid RCA stenosis. The Y graft to the PDA and posterolateral artery appears to be occluded but there is competitive flow into the posterolateral artery. Recommendations; She has early failure of 4/5 bypass grafts. This is likely due to the small target vessel size. The LIMA to the LAD remains patent. Her imaging suggests washout of the posterolateral artery from an apparent patent bypass graft however the graft to the RCA is clearly occluded by angiography. This is described in the operative report as a Y graft. It is possible that a segment of this graft remains open although it cannot be seen today. There is an unusual appearance of the anastomosis of the SVG to the OM. No competitive filling is seen into this target vessel. I would continue medical therapy for now. We can consider PCI/stenting of the RCA if there is recurrent angina. This would require stenting from the ostium of the RCA into the distal vessel.  As above, there is apparent competitive filling of the posterolateral artery from a bypass graft that was not found or left to right collaterals which are not seen well on angiography of the left system.    Cardiac Studies   Cardiac catheterization 02/22/2020 Diagnostic Dominance: Right  Intervention    Patient Profile     59 y.o. female with longstanding insulin requiring diabetes mellitus, extensive CAD, now day 9 status post multivessel CABG, status post syncope and fall on postop day 5, with nondisplaced right orbital and  left wrist fractures, small non-ST segment apicolateral myocardial infarction found to have early saphenous vein graft failure with continued patency of LIMA to LAD and preserved LVEF.  Assessment & Plan    No arrhythmia during multiple days of telemetry.  Symptoms of dizziness and near syncope have resolved with improvement in blood pressure with volume administration and cessation of antihypertensives. Okay to DC home today. Continue aspirin 325 mg and lipid-lowering therapy, but will keep off beta-blockers and ACE inhibitors for the time being.  Target LDL<70. Recheck in 3 months. Add/switch to PCSK9 inhibitor if not at  target. We will plan an outpatient 2-week event monitor. If she has not had new episodes of syncope/near syncope and her blood pressure allows would restart metoprolol 25 mg twice daily either at her follow-up appointment with Dr. Kipp Brood on Friday, 03/03/2020 or at her follow-up in our office in a couple of weeks.  [facility] will sign off.   Medication Recommendations:  Continue aspirin 325 mg and lipid-lowering therapy, but will keep off beta-blockers and ACE inhibitors for the time being. Other recommendations (labs, testing, etc):  outpatient 2-week event monitor. Follow up as an outpatient:  2 weeks with APP, 4-5 weeks with me.  For questions or updates, please contact [facility] Please consult [email or web address] for contact info under        Signed, Sanda Klein, MD  03/01/2020, 9:37 AM

## 2020-03-01 NOTE — Progress Notes (Signed)
OT Cancellation Note + OT Discharge Note  Patient Details Name: Felicia [name] MRN: [id number] DOB: November 19, 1960   Cancelled Treatment:    Reason Eval/Treat Not Completed: Other (comment)(Pt D/C from OT at this time.)   Pt is leaving and does not want more OT after talking to her. Pt plans to have help at home and already has figured out your shower situation with use of a chair in it. Already has reacher. Mom will assist at all times. OT D/C at this time.  Flora Lipps, OTR/L Acute Rehabilitation Services Pager: [phone] Office: [phone]    Felicia [name] C 03/01/2020, 11:00 AM

## 2020-03-01 NOTE — Telephone Encounter (Signed)
Patient would like to change care from Dr. Mariah Milling to Dr. Royann Shivers. Please let the patient know what the office decides.

## 2020-03-01 NOTE — Progress Notes (Signed)
Physical Therapy Treatment Patient Details Name: Felicia [name] MRN: [id number] DOB: 1961-02-03 Today's Date: 03/01/2020    History of Present Illness 59 year old female who presented after mechanical fall and syncope complicated by left radius/ulna fracture, left orbital wall, lateral maxillary wall, inferior orbital floor fracture.  Treatment for right BPPV as well by PT. PMHx: coronary artery disease status post 5 vessel bypass February 21, 2020, T2DM, HTN and DLD She was discharged from the hospital 2 days ago.     PT Comments    Pt admitted with above diagnosis. Pt states that the vertigo has completely resolved after treatment yesterday.  Pt very pleased with how she feels and is ready to go home. Discussed that pt does not want HHPT or HHOT as she has famiily 24 hours in her home.  Updated CM regarding this.  Mom and husband have been educated in pts care.  Pt has all needed equipment as well.  Discussed that pt can f/u with ENT if vertigo symptoms return.  Pt understands all recommendations.  Pt currently with functional limitations due to endurance deficits. Pt will benefit from skilled PT to increase their independence and safety with mobility to allow discharge to the venue listed below.     Follow Up Recommendations  No PT follow up;Supervision/Assistance - 24 hour     Equipment Recommendations  Other (comment)(gait belt issued)    Recommendations for Other Services       Precautions / Restrictions Precautions Precautions: Fall;Sternal Precaution Comments:  uses heart pillow, splint on L UE Restrictions Weight Bearing Restrictions: Yes LUE Weight Bearing: Non weight bearing Other Position/Activity Restrictions: sternal precautions    Mobility  Bed Mobility Overal bed mobility: Needs Assistance Bed Mobility: Rolling;Sidelying to Sit Rolling: Supervision         General bed mobility comments: Pt reports she got OOB on her own this am.  Sitting in chair on arrival.    Transfers Overall transfer level: Needs assistance Equipment used: None Transfers: Sit to/from Stand Sit to Stand: Supervision            Ambulation/Gait             General Gait Details: Did not want to walk. Wanted to get ready to leave. Mom in room getting pt ready to d/c.     Stairs             Wheelchair Mobility    Modified Rankin (Stroke Patients Only)       Balance                                            Cognition Arousal/Alertness: Awake/alert Behavior During Therapy: WFL for tasks assessed/performed Overall Cognitive Status: Within Functional Limits for tasks assessed                                        Exercises Other Exercises Other Exercises: Nestor Lewandowsky exercise reviewed    General Comments General comments (skin integrity, edema, etc.): Pt reports that "whoosh" feeling is gone.  Discussed the Nestor Lewandowsky exercise if it comes back.       Pertinent Vitals/Pain Pain Assessment: No/denies pain    Home Living  Prior Function            PT Goals (current goals can now be found in the care plan section) Progress towards PT goals: Progressing toward goals    Frequency    Min 3X/week      PT Plan Current plan remains appropriate    Co-evaluation              AM-PAC PT "6 Clicks" Mobility   Outcome Measure  Help needed turning from your back to your side while in a flat bed without using bedrails?: None Help needed moving from lying on your back to sitting on the side of a flat bed without using bedrails?: None Help needed moving to and from a bed to a chair (including a wheelchair)?: None Help needed standing up from a chair using your arms (e.g., wheelchair or bedside chair)?: None Help needed to walk in hospital room?: A Little Help needed climbing 3-5 steps with a railing? : A Little 6 Click Score: 22    End of Session Equipment  Utilized During Treatment: Gait belt Activity Tolerance: Patient tolerated treatment well Patient left: with call bell/phone within reach;with family/visitor present;in chair Nurse Communication: Mobility status PT Visit Diagnosis: Difficulty in walking, not elsewhere classified (R26.2);Dizziness and giddiness (R42);Muscle weakness (generalized) (M62.81)     Time: 0950-1004 PT Time Calculation (min) (ACUTE ONLY): 14 min  Charges:  $Self Care/Home Management: 8-22                     Koa Zoeller W,PT Acute Rehabilitation Services Pager:  [phone]  Office:  [phone]     Felicia [name] 03/01/2020, 10:13 AM

## 2020-03-01 NOTE — Plan of Care (Signed)

## 2020-03-01 NOTE — Care Management (Signed)
03-01-20 1003 Case Manager received secure chat from physical therapist Dawn that the vestibular issues have cleared and patient will not need home health physical therapy services. Case Manager spoke with patient and she also declined home health occupational therapy services. Staff RN is aware. Patients mother in the room and she will provide transportation home via private vehicle. No further needs from Case Manager at this time. Graves-Bigelow, Lamar Laundry, RN, BSN Case Manager

## 2020-03-02 ENCOUNTER — Telehealth: Payer: Self-pay | Admitting: *Deleted

## 2020-03-02 ENCOUNTER — Ambulatory Visit: Payer: BC Managed Care – PPO | Admitting: Internal Medicine

## 2020-03-02 DIAGNOSIS — M25532 Pain in left wrist: Secondary | ICD-10-CM | POA: Diagnosis not present

## 2020-03-02 NOTE — Telephone Encounter (Signed)
No objections

## 2020-03-02 NOTE — Telephone Encounter (Signed)
I spoke with the patient on 02/28/20 when a previous request was received for the same thing. I had called and spoken with the patient while she was still an inpatient and advised her I was going to go ahead and order a [employer] AT to be mailed to her home.  The patient was agreeable. She had told me it may be another day or 2 from   I checked the [employer] website this morning and the patient's monitor was shipped to her already. She should be receiving this today/ tomorrow.

## 2020-03-02 NOTE — Telephone Encounter (Signed)
-----   Message from Oneida Arenas sent at 03/02/2020  8:49 AM EDT ----- Regarding: FW: Heart monitor  ----- Message ----- From: Ernst Bowler Sent: 03/01/2020   4:00 PM EDT To: Oneida Arenas Subject: FW: Heart monitor                               ----- Message ----- From: Marianne Sofia, PA-C Sent: 03/01/2020  10:37 AM EDT To: Ernst Bowler Subject: Heart monitor                                  Good morning, this patient is being discharged form the hospital today for syncope. She will need a 2 week event monitor and this would be easiest to be picked up in [place]. She previously followed in [place] with Dr. Mariah Milling but is requesting to be seen by Dr. Salena Saner at [facility] from now on.   Thanks

## 2020-03-03 ENCOUNTER — Telehealth: Payer: Self-pay | Admitting: Family Medicine

## 2020-03-03 ENCOUNTER — Ambulatory Visit: Payer: BC Managed Care – PPO | Admitting: Family Medicine

## 2020-03-03 ENCOUNTER — Ambulatory Visit: Payer: Self-pay | Admitting: Thoracic Surgery (Cardiothoracic Vascular Surgery)

## 2020-03-03 ENCOUNTER — Encounter: Payer: Self-pay | Admitting: Family Medicine

## 2020-03-03 ENCOUNTER — Other Ambulatory Visit: Payer: Self-pay

## 2020-03-03 VITALS — BP 112/64 | HR 74 | Temp 98.4°F

## 2020-03-03 DIAGNOSIS — E103293 Type 1 diabetes mellitus with mild nonproliferative diabetic retinopathy without macular edema, bilateral: Secondary | ICD-10-CM | POA: Diagnosis not present

## 2020-03-03 DIAGNOSIS — Z951 Presence of aortocoronary bypass graft: Secondary | ICD-10-CM | POA: Diagnosis not present

## 2020-03-03 DIAGNOSIS — M7989 Other specified soft tissue disorders: Secondary | ICD-10-CM | POA: Diagnosis not present

## 2020-03-03 NOTE — Progress Notes (Signed)
Subjective:    Patient ID: Felicia [name], female    DOB: 1961/01/06, 59 y.o.   MRN: [id number]  HPI Chief Complaint  Patient presents with  . Leg Swelling   This is a 59 year old female, accompanied by her mother, who presents today with some new leg swelling.  Patient was admitted to the hospital 02/17/2020 with unstable angina.  She had a coronary artery bypass graft x5 on 02/21/2020 and was discharged home on 02/24/2020.  She had a syncopal episode on 02/25/2020 and was discharged on 03/01/2020.  Unfortunately, with her syncopal episode she fell and broke her left radius.  She was felt to be orthostatic and her Lasix, lisinopril, metoprolol were discontinued.  During hospitalization she had a cardiac catheterization which showed that 4 of her 5 recent grafts had some degree of occlusion.  She was seen by cardiology with plans for medical management. She had an appointment today with her cardiothoracic surgeon but it was canceled due to an emergency. She presents today because she noticed some swelling of her ankles over the last 24 hours.  She denies any calf pain, chest pain, shortness of breath.  She has been following postoperative instructions with regards to walking for 10 minutes twice a day.  She has had to rest months due to fatigue.  She has been able to do her normal ADLs.  She did notice some increased swelling after sitting at her computer for about 1 hour, this improved with elevating her legs.  She denies any calf pain.  She was told to liberalize her sodium intake due to low sodium at the hospital and low blood pressure.  She reports that she had pizza yesterday to which she applied some salt as well as some potato chips today.  She has been checking her blood sugars at home and the highest has been 180.  She is on Lantus twice a day with sliding scale. She denies any further syncope pull episodes, dizziness, lightheadedness.  She reports that she had vestibular physical therapy while in  the hospital with resolution of symptoms.  Review of Systems Per HPI    Objective:   Physical Exam Vitals reviewed.  Constitutional:      General: She is not in acute distress.    Appearance: She is normal weight. She is not ill-appearing, toxic-appearing or diaphoretic.     Comments: Patient is in wheelchair. Left arm cast/ ace wrap intact.   HENT:     Head: Normocephalic.     Comments: Right upper cheek with resolving ecchymosis.  Eyes:     Conjunctiva/sclera: Conjunctivae normal.  Cardiovascular:     Rate and Rhythm: Normal rate and regular rhythm.     Pulses: Normal pulses.     Heart sounds: Normal heart sounds.  Pulmonary:     Effort: Pulmonary effort is normal.     Breath sounds: Normal breath sounds.  Musculoskeletal:     Cervical back: Normal range of motion and neck supple.     Right lower leg: Edema (trace ankle, pretibial) present.     Left lower leg: Edema (trace ankle, pretibial) present.  Skin:    General: Skin is warm and dry.     Comments: Sternal incision with well approximated edges, moderate amount of scabbing, no erythema or drainage. Right leg with healing incision medial aspect slightly below knee, no erythema or drainage.  Distal, puncture wound without erythema or drainage  Neurological:     Mental Status: She is alert and  oriented to person, place, and time.  Psychiatric:        Mood and Affect: Mood normal.        Behavior: Behavior normal.        Thought Content: Thought content normal.        Judgment: Judgment normal.       BP 112/64 (BP Location: Left Arm, Patient Position: Sitting, Cuff Size: Normal)   Pulse 74   Temp 98.4 F (36.9 C) (Temporal)   SpO2 96%  Wt Readings from Last 3 Encounters:  03/01/20 142 lb 11.2 oz (64.7 kg)  02/24/20 138 lb 3.7 oz (62.7 kg)  02/17/20 136 lb (61.7 kg)       Assessment & Plan:  1. Leg swelling -Very minor bilateral ankle and lower leg swelling that has improved with leg elevation today. -No  concerning history or physical exam findings -Encouraged her to increase her fluid intake, elevate legs when standing, continue to work on increased walking as tolerated -Follow-up precautions reviewed and resources for on-call physician if she has questions over the weekend  2. S/P CABG (coronary artery bypass graft) -Incisions healing nicely -She denies any chest pain, shortness of breath -She has rescheduled follow-up with cardiothoracic surgeon for next week  3. 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  She is checking her blood sugars multiple times a day.  She has ordered a continuous glucose monitor which will hopefully be here soon and give her some relief from sticking her fingers so frequently. -Follow-up with Dr. Wyonia Hough  This visit occurred during the SARS-CoV-2 public health emergency.  Safety protocols were in place, including screening questions prior to the visit, additional usage of staff PPE, and extensive cleaning of exam room while observing appropriate contact time as indicated for disinfecting solutions.    Olean Ree, FNP-BC  [facility], [facility]  03/03/2020 5:10 PM

## 2020-03-03 NOTE — Telephone Encounter (Signed)
I spoke with pt and bilateral swelling in legs started on 03/02/20; not a lot of swelling just slight and today the swelling is still there and might be slightly more swollen than yesterday but very close to same amt of swelling. Pt is trying to elevate legs when sitting.no difficulty walking, no pain in legs, no redness, no warmth or red streaks noted. No CP or SOB. Pt has felt slight heaviness in back like might have slight swelling??. Pt said was on Lasix for 1 day and fell and went back into hospital and was not put back on the Lasix. Pt said the Lasix did not bother her. Pt was going to talk to surgeon about this at her FU appt today but that appt was cancelled due to doctor having emergent surgery on another pt. Pt just would feel better if she saw someone before the weekend. Pt will keep appt today at 4PM with Harlin Heys FNP. UC & ED precautions given andpt voiced understanding.

## 2020-03-03 NOTE — Telephone Encounter (Signed)
Noted  

## 2020-03-03 NOTE — Telephone Encounter (Signed)
Spoke with a Dr Reece Agar patient with leg pain/swelling x 1 day, worse today - no redness, SOB. Feels swelling in her lower back as well. She had bypass surgery 4/19 and her surgeon cancelled her appt today for f/u d/t an emergency surgery. Pt is very concerned of the swelling and pain with recent surgery. wanting to be seen today. Dr Reece Agar has nothing available.   Spoke with Deboraha Sprang and she was okay with seeing this patient at 4pm today but wants her further triaged to ensure safe to see her or if needs to go to the ED.   Will send to Lewanda Rife, RN to triage further to determine if appt today at 4:00pm is appropriate.

## 2020-03-03 NOTE — Patient Instructions (Signed)
Good to see you today  Increase fluids, elevate feet when sitting  Follow up with surgeon, cardiologist as scheduled

## 2020-03-05 NOTE — Telephone Encounter (Signed)
Fine with me, thx Maris Berger

## 2020-03-07 ENCOUNTER — Inpatient Hospital Stay: Payer: BC Managed Care – PPO | Admitting: Family Medicine

## 2020-03-08 ENCOUNTER — Ambulatory Visit: Payer: BC Managed Care – PPO

## 2020-03-08 ENCOUNTER — Ambulatory Visit: Payer: BC Managed Care – PPO | Admitting: Family

## 2020-03-09 ENCOUNTER — Telehealth: Payer: Self-pay

## 2020-03-09 ENCOUNTER — Telehealth: Payer: Self-pay | Admitting: Internal Medicine

## 2020-03-09 DIAGNOSIS — S52502A Unspecified fracture of the lower end of left radius, initial encounter for closed fracture: Secondary | ICD-10-CM | POA: Diagnosis not present

## 2020-03-09 NOTE — Telephone Encounter (Signed)
Form was just received today and will be processed in the standard time frame for paperwork.

## 2020-03-09 NOTE — Telephone Encounter (Signed)
Form for CGM order and Certificate/Letter of Medical Neccescity have been received, completed, and signed by Dr. Elvera Lennox.  Completed form has been faxed to [employer] with confirmation.

## 2020-03-09 NOTE — Telephone Encounter (Signed)
Patient called checking up to make sure we got the forms from [employer] requesting RX for a dexcom. Ph# [phone]

## 2020-03-10 ENCOUNTER — Other Ambulatory Visit: Payer: Self-pay

## 2020-03-10 ENCOUNTER — Ambulatory Visit: Payer: Self-pay | Admitting: Thoracic Surgery (Cardiothoracic Vascular Surgery)

## 2020-03-10 ENCOUNTER — Encounter: Payer: Self-pay | Admitting: Thoracic Surgery (Cardiothoracic Vascular Surgery)

## 2020-03-10 ENCOUNTER — Ambulatory Visit (INDEPENDENT_AMBULATORY_CARE_PROVIDER_SITE_OTHER): Payer: Self-pay | Admitting: Thoracic Surgery (Cardiothoracic Vascular Surgery)

## 2020-03-10 ENCOUNTER — Ambulatory Visit: Payer: BC Managed Care – PPO | Admitting: Family Medicine

## 2020-03-10 VITALS — BP 170/84 | HR 75 | Temp 97.6°F | Resp 20 | Ht 64.0 in | Wt 140.0 lb

## 2020-03-10 DIAGNOSIS — I2510 Atherosclerotic heart disease of native coronary artery without angina pectoris: Secondary | ICD-10-CM

## 2020-03-10 DIAGNOSIS — E103293 Type 1 diabetes mellitus with mild nonproliferative diabetic retinopathy without macular edema, bilateral: Secondary | ICD-10-CM | POA: Diagnosis not present

## 2020-03-10 DIAGNOSIS — Z951 Presence of aortocoronary bypass graft: Secondary | ICD-10-CM

## 2020-03-10 NOTE — Progress Notes (Signed)
      [address]       [place] [address]             [phone]        Felicia [name] [facility] Medical Record #[id number] Date of Birth: May 23, 1961  Referring: Antonieta Iba, MD Primary Care: [name], MD Primary Cardiologist:[name], MD  Reason for visit:   follow-up  History of Present Illness:     Felicia [name] comes in for a follow-up appointment.  Unfortunately, since surgery, she was readmitted for a syncopal episode where she fell and broke her right wrist.  Orthostasis was the likely cause, but she had a troponin elevation, and apical wall motion abnormalities.  The LHC showed that the LIMA-LAD anastomosis was open, but the other vein grafts were occluded.  She currently denies any chest pain  Physical Exam: BP (!) 170/84   Pulse 75   Temp 97.6 F (36.4 C) (Skin)   Resp 20   Ht 5\' 4"  (1.626 m)   Wt 140 lb (63.5 kg)   SpO2 99% Comment: RA  BMI 24.03 kg/m   Alert NAD Incision clean.  Sternum stable Abdomen soft, ND trace peripheral edema       Assessment / Plan:   59 yo female s/p CABG 5, with early vein graft failure.  The PD, and PL branches were sequentialled, and there appears to be competitive filling from the PL branch.  The diag and OM were both small vessels.  She is otherwise doing well, and will be a candidate for PCI if she develops chest pain in the future.    She will follow-up in 1 month with CXR   58 03/10/2020 12:40 PM

## 2020-03-10 NOTE — Telephone Encounter (Signed)
Checking on status of monitor for patient. S/w with rep with [employer] because could not find patient's registration on [employer]. [employer] rep said patient contacted them and said she did not want to wear it because she had broken her arm and it would be too uncomfortable to wear. Patient was going to mail the monitor back.   She has follow up in [place]. Routing to that provider to make her aware.

## 2020-03-14 DIAGNOSIS — S52592A Other fractures of lower end of left radius, initial encounter for closed fracture: Secondary | ICD-10-CM | POA: Diagnosis not present

## 2020-03-14 NOTE — Telephone Encounter (Signed)
Folder placed in nurse box

## 2020-03-14 NOTE — Progress Notes (Addendum)
Cardiology Office Note:    Date:  03/15/2020   ID:  Felicia [name], DOB 12-31-1960, MRN [id number]  PCP:  [name], MD  Cardiologist:  Thurmon Fair, MD   Referring MD: [name], MD   Chief Complaint  Patient presents with  . Follow-up    CAD    History of Present Illness:    Felicia [name] is a 59 y.o. female with a hx of DM type 1, HTN, HLD, gastroparesis, CAD and aortic atherosclerosis on CT scan. She presented to the office with [place] on 02/08/20 and was scheduled for angiography. She underwent heart catheterization 4/51/21 which revealed multivessel disease and was transferred to [facility] for further evaluation. She ultimately underwent CABG x 5 on 02/21/20: LIMA-LAD, SVG-OM1, SVG-D1, SVG-PDA-PLV. She tolerated the procedure well and was discharged on 02/24/20. She was re-admitted 02/25/20 following a syncopal episode. Description of and subsequent episodes sounded consistent with vasovagal mediated event. However, she was also severely orthostatic. BB and ACEI were stopped. HS troponin trended to 5000 after initial decline and echo showed new wall motion abnormality. She was taken back to the cath lab 02/29/20 which showed patent LIMA-LAD, but occluded SVG-D1, occluded SVG-OM, and occlusion of the Y graft to the PDA and PLA - early graft failure of 4/5 bypass grafts likely due to small target vessels.  No intervention, medical therapy was recommended with plans for RCA intervention should she have recurrent chest pain. She was discharged on 325 mg ASA and statin. All anti-hypertensives were discontinued.   She presents today for follow up.  She is here with her mother. When I entered the room, she was experiencing a bout of hypoglycemia.  We provided apple juice and she felt much better. She denies problems with her BG recently.  She comes with a BP log that shows labile BP from 101 - 160s systolic. We discussed role of salt and higher BP. Given her recent events, I think its  acceptable to let her run on the higher side while she is recovering.   She received the first dose of the pfizer vaccination which made her heart race for 10 min - two weeks prior to her first heart cath.  She will hold off on second dose for now.   She presented in a wheelchair. We discussed increasing walking regimen slowly. She reports she fatigues easily and after her syncope and fall, is having a hard time trusting her body.   Past Medical History:  Diagnosis Date  . Allergy   . Anemia    past history long ago  . Borderline HTN (hypertension)   . CAD (coronary artery disease)    a. 02/2020 Cath: LM nl, LAD large, 90p, 13m, D1 90p, LCX nl, OM 60-70p, RCA dominant w/ dampening upon engagement of ostium. 70p/m, RPDA 50. EF 55-65%.  . Disorder of kidney    has abd kidney (right)  . GERD (gastroesophageal reflux disease)   . History of diabetic gastroparesis   . History of echocardiogram    a. 01/2013 Echo: EF 55-65%, no rwma. Mild MR. Nl RV fxn.  Marland Kitchen HLD (hyperlipidemia)   . Hx: UTI (urinary tract infection)   . IBS (irritable bowel syndrome)   . Nephrolithiasis 2005  . Osteopenia 2012   per pt  . Systolic murmur 01/2013   mild mitral insuff  . Type 1 diabetes mellitus with diabetic retinopathy without macular edema (HCC)    dx at 59 y/o, background retinopathy    Past Surgical  History:  Procedure Laterality Date  . CESAREAN SECTION  1985  . COLONOSCOPY  07/2016   WNL ([place])  . CORONARY ARTERY BYPASS GRAFT N/A 02/21/2020   Procedure: CORONARY ARTERY BYPASS GRAFTING (CABG) times five using left internal mammary and right leg saphenous vein;  Surgeon: Corliss Skains, MD;  Location: [facility] OR;  Service: Open Heart Surgery;  Laterality: N/A;  . ESOPHAGOGASTRODUODENOSCOPY  07/2016   LA Grade C reflux esophagitis, concern for stasis/dysmotility ([place])  . LAPAROSCOPIC APPENDECTOMY N/A 09/01/2015   Procedure: APPENDECTOMY LAPAROSCOPIC;  Surgeon: Jimmye Norman, MD;  Location: [facility] OR;   Service: General;  Laterality: N/A;  . LEFT HEART CATH AND CORONARY ANGIOGRAPHY N/A 02/17/2020   Procedure: LEFT HEART CATH AND CORONARY ANGIOGRAPHY;  Surgeon: Antonieta Iba, MD;  Location: [facility] INVASIVE CV LAB;  Service: Cardiovascular;  Laterality: N/A;  . LEFT HEART CATH AND CORS/GRAFTS ANGIOGRAPHY N/A 02/29/2020   Procedure: LEFT HEART CATH AND CORS/GRAFTS ANGIOGRAPHY;  Surgeon: [name], MD;  Location: [facility] INVASIVE CV LAB;  Service: Cardiovascular;  Laterality: N/A;  . ORIF FEMUR FRACTURE Left 1996   Corrected by patient in history 2016  . PARTIAL HYSTERECTOMY  2000   paps by [facility] [place]  . TEE WITHOUT CARDIOVERSION N/A 02/21/2020   Procedure: TRANSESOPHAGEAL ECHOCARDIOGRAM (TEE);  Surgeon: Corliss Skains, MD;  Location: [facility] OR;  Service: Open Heart Surgery;  Laterality: N/A;  . TONSILLECTOMY AND ADENOIDECTOMY  1975  . TRIGGER FINGER RELEASE Left 12/2010   thumb    Current Medications: Current Meds  Medication Sig  . acetaminophen (TYLENOL) 325 MG tablet Take 2 tablets (650 mg total) by mouth every 6 (six) hours as needed for mild pain.  Marland Kitchen aspirin EC 325 MG EC tablet Take 1 tablet (325 mg total) by mouth daily.  Marland Kitchen esomeprazole (NEXIUM) 40 MG capsule Take 1 capsule (40 mg total) by mouth daily. Patient needs office visit for further refills  . ezetimibe (ZETIA) 10 MG tablet Take 1 tablet (10 mg total) by mouth daily.  Marland Kitchen glucagon (GLUCAGON EMERGENCY) 1 MG injection Inject 1 mg into the muscle once as needed (in case of severe hypoglycemia).  . Insulin Glargine (LANTUS SOLOSTAR) 100 UNIT/ML Solostar Pen Inject 12 Units into the skin daily. (Patient taking differently: Inject 8-10 Units into the skin See admin instructions. Inject 10 units into the skin in the morning and 8 units in the evening)  . Insulin Pen Needle (NOVOFINE) 32G X 6 MM MISC USE TO INJECT INSULIN 4 TIMES A DAY  . insulin regular (NOVOLIN R) 100 units/mL injection INJECT 6 UNITS TOTAL INTO  THE SKIN 3 (THREE) TIMES DAILY BEFORE MEALS. (Patient taking differently: Inject 1-10 Units into the skin See admin instructions. For glucose 150-210 add 1 additional unit, per scale 211-270= 2 units, 271-330= 3 units, 331-390= 4 units  For breakfast and dinner, administer 1 unit for every 10 grams of carbs. For snacks and dinner, administer 1 unit for every 20 grams of carbs.)  . Insulin Syringe-Needle U-100 30G X 5/16" 0.3 ML MISC Use to inject insulin 6 times a day.  . Multiple Vitamin (MULTIVITAMIN) tablet Take 1 tablet by mouth daily.    . [DISCONTINUED] pravastatin (PRAVACHOL) 40 MG tablet Take 1 tablet (40 mg total) by mouth at bedtime.     Allergies:   Protonix [pantoprazole], Toujeo solostar [insulin glargine], Lactose intolerance (gi), Magnesium-containing compounds, and Adhesive [tape]   Social History   Socioeconomic History  . Marital status: Married  Spouse name: Not on file  . Number of children: 1  . Years of education: 2916  . Highest education level: Bachelor's degree (e.g., BA, AB, BS)  Occupational History  . Occupation: HR Chemical engineerdirector    Employer: OTHER    Comment: Scientist, research ([employer]  Tobacco Use  . Smoking status: Never Smoker  . Smokeless tobacco: Never Used  Substance and Sexual Activity  . Alcohol use: No    Alcohol/week: 0.0 standard drinks  . Drug use: No  . Sexual activity: Not on file  Other Topics Concern  . 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  Has one daughter.  Education: college.      1 dog   Social Determinants of Corporate investment bankerHealth   Financial Resource Strain:   . Difficulty of Paying Living Expenses:   Food Insecurity:   . Worried About Programme researcher, broadcasting/film/videounning Out of Food in the Last Year:   . Baristaan Out of Food in the Last Year:   Transportation Needs:   . Freight forwarderLack of Transportation (Medical):   Marland Kitchen. Lack of Transportation (Non-Medical):   Physical Activity:   . Days of Exercise  per Week:   . Minutes of Exercise per Session:   Stress:   . Feeling of Stress :   Social Connections:   . Frequency of Communication with Friends and Family:   . Frequency of Social Gatherings with Friends and Family:   . Attends Religious Services:   . Active Member of Clubs or Organizations:   . Attends BankerClub or Organization Meetings:   Marland Kitchen. Marital Status:      Family History: The patient's family history includes Breast cancer in an other family member; CAD (age of onset: 5752) in her brother; COPD in her maternal grandmother; Colon cancer in her maternal grandfather; Colon polyps in her mother; Diabetes in her maternal grandmother; Heart failure in her maternal grandmother; Thyroid disease in her maternal grandmother; Vascular Disease in her maternal grandmother. There is no history of Esophageal cancer, Rectal cancer, or Stomach cancer.  ROS:   Please see the history of present illness.     All other systems reviewed and are negative.  EKGs/Labs/Other Studies Reviewed:    The following studies were reviewed today:  Cath: 02/17/20   1st Mrg lesion is 60% stenosed.  Prox LAD to Mid LAD lesion is 90% stenosed.  Mid LAD-1 lesion is 80% stenosed.  Mid LAD-2 lesion is 50% stenosed.  1st Diag-1 lesion is 95% stenosed.  1st Diag-2 lesion is 70% stenosed.  Ost RCA to Prox RCA lesion is 70% stenosed.  Mid RCA lesion is 70% stenosed.  RPDA lesion is 50% stenosed.  The left ventricular ejection fraction is greater than 65% by visual estimate.  LV end diastolic pressure is normal.  The left ventricular systolic function is normal.  There is no mitral valve regurgitation.  Diagnostic Dominance: Right   Heart cath 02/29/20:  Prox LAD to Mid LAD lesion is 90% stenosed.  Ost RCA to Prox RCA lesion is 70% stenosed.  Mid RCA lesion is 70% stenosed.  RPDA lesion is 50% stenosed.  1st Diag lesion is 99% stenosed.  1st Mrg-1 lesion is 60% stenosed.  1st Mrg-2 lesion  is 80% stenosed.  Mid LAD lesion is 50% stenosed.  LIMA graft was visualized by angiography and is normal in caliber.  SVG graft was visualized by angiography.  Origin to Prox Graft lesion is 100% stenosed.  Origin lesion is 100% stenosed.  Origin to Prox Graft lesion before RPAV is 100% stenosed.   1. Severe triple vessel CAD s/p 5V CABG with 1/5 patent bypass grafts 2. Severe stenosis mid LAD. Patent LIMA to LAD 3. Severe stenosis in the small caliber Diagonal branch. The vein graft to this vessel is occluded 4. Severe stenosis in the small caliber obtuse marginal branch. The vein graft to this vessel is occluded 5. Moderate ostial RCA stenosis. Severe mid RCA stenosis. The Y graft to the PDA and posterolateral artery appears to be occluded but there is competitive flow into the posterolateral artery.   Recommendations; She has early failure of 4/5 bypass grafts. This is likely due to the small target vessel size. The LIMA to the LAD remains patent. Her imaging suggests washout of the posterolateral artery from an apparent patent bypass graft however the graft to the RCA is clearly occluded by angiography. This is described in the operative report as a Y graft. It is possible that a segment of this graft remains open although it cannot be seen today. There is an unusual appearance of the anastomosis of the SVG to the OM. No competitive filling is seen into this target vessel.   I would continue medical therapy for now. We can consider PCI/stenting of the RCA if there is recurrent angina. This would require stenting from the ostium of the RCA into the distal vessel.  As above, there is apparent competitive filling of the posterolateral artery from a bypass graft that was not found or left to right collaterals which are not seen well on angiography of the left system.   Diagnostic Dominance: Right    EKG:  EKG is not  ordered today.    Recent Labs: 02/20/2020: ALT 14; TSH  2.744 02/22/2020: Magnesium 2.3 02/26/2020: B Natriuretic Peptide 598.5 03/01/2020: BUN 11; Creatinine, Ser 0.97; Hemoglobin 12.7; Platelets 338; Potassium 4.7; Sodium 139  Recent Lipid Panel    Component Value Date/Time   CHOL 196 09/23/2019 0742   TRIG 160.0 (H) 09/23/2019 0742   HDL 58.10 09/23/2019 0742   CHOLHDL 3 09/23/2019 0742   VLDL 32.0 09/23/2019 0742   LDLCALC 106 (H) 09/23/2019 0742   LDLDIRECT 120.0 06/17/2019 0738    Physical Exam:    VS:  BP (!) 144/78   Pulse 81   Temp (!) 97.3 F (36.3 C)   Ht 5\' 4"  (1.626 m)   SpO2 99%   BMI 24.03 kg/m     Wt Readings from Last 3 Encounters:  03/10/20 140 lb (63.5 kg)  03/01/20 142 lb 11.2 oz (64.7 kg)  02/24/20 138 lb 3.7 oz (62.7 kg)     GEN:  Well nourished, well developed in no acute distress HEENT: Normal NECK: No JVD; No carotid bruits LYMPHATICS: No lymphadenopathy CARDIAC: RRR, no murmurs, rubs, gallops RESPIRATORY:  Clear to auscultation without rales, wheezing or rhonchi  ABDOMEN: Soft, non-tender, non-distended MUSCULOSKELETAL:  Mild B LE edema; No deformity  SKIN: Warm and dry NEUROLOGIC:  Alert and oriented x 3 PSYCHIATRIC:  Normal affect  Left arm in cast Sternotomy C/D/I Right groin sites C/D/I  ASSESSMENT:    1. Coronary artery disease involving native heart without angina pectoris, unspecified vessel or lesion type   2. Non-ST elevation (NSTEMI) myocardial infarction ([place])   3. Hx of CABG   4. Hyperlipidemia due to type 1 diabetes mellitus ([place])   5. Syncope, unspecified syncope type  6. Orthostatic hypotension    PLAN:    In order of problems listed above:  CAD s/p CABG x 5 with early failure of 4/5 grafts - 325 mg ASA x 3 months from CABG, statin - chest soreness, no angina - she will start cardiac rehab in 4 weeks when wrist cast comes off - she is walking, but tires easily - we discussed that she needs time for her body to heal and find its new baseline - I will not add any new  medications for now   Syncope Orthostatic hypotension - all antihypertensives were D/C'ed  - no further syncope - pressure labile, will not start anything today - continue BP log   Hyperlipidemia with LDL goal < 70 - continue statin and zetia - will change pravachol to lovastatin for a trial - will refer to lipid clinic for repatha   Follow up as scheduled.   Medication Adjustments/Labs and Tests Ordered: Current medicines are reviewed at length with the patient today.  Concerns regarding medicines are outlined above.  Orders Placed This Encounter  Procedures  . AMB Referral to [facility]   Meds ordered this encounter  Medications  . lovastatin (MEVACOR) 20 MG tablet    Sig: Take 1 tablet (20 mg total) by mouth at bedtime.    Dispense:  30 tablet    Refill:  6    Signed, Marcelino Duster, [place]  03/15/2020 5:50 PM    [facility]

## 2020-03-15 ENCOUNTER — Encounter: Payer: Self-pay | Admitting: Physician Assistant

## 2020-03-15 ENCOUNTER — Ambulatory Visit (INDEPENDENT_AMBULATORY_CARE_PROVIDER_SITE_OTHER): Payer: BC Managed Care – PPO | Admitting: Physician Assistant

## 2020-03-15 ENCOUNTER — Other Ambulatory Visit: Payer: Self-pay

## 2020-03-15 ENCOUNTER — Telehealth: Payer: Self-pay | Admitting: *Deleted

## 2020-03-15 VITALS — BP 144/78 | HR 81 | Temp 97.3°F | Ht 64.0 in

## 2020-03-15 DIAGNOSIS — Z951 Presence of aortocoronary bypass graft: Secondary | ICD-10-CM

## 2020-03-15 DIAGNOSIS — I951 Orthostatic hypotension: Secondary | ICD-10-CM

## 2020-03-15 DIAGNOSIS — I2510 Atherosclerotic heart disease of native coronary artery without angina pectoris: Secondary | ICD-10-CM

## 2020-03-15 DIAGNOSIS — I214 Non-ST elevation (NSTEMI) myocardial infarction: Secondary | ICD-10-CM

## 2020-03-15 DIAGNOSIS — E1069 Type 1 diabetes mellitus with other specified complication: Secondary | ICD-10-CM

## 2020-03-15 DIAGNOSIS — R55 Syncope and collapse: Secondary | ICD-10-CM

## 2020-03-15 DIAGNOSIS — E785 Hyperlipidemia, unspecified: Secondary | ICD-10-CM

## 2020-03-15 MED ORDER — LOVASTATIN 20 MG PO TABS: 20.0000 mg | ORAL_TABLET | Freq: Every day | ORAL | 6 refills | Status: DC

## 2020-03-15 NOTE — Patient Instructions (Signed)
Medication Instructions:   STOP Pravachol  START Lovastatin 20 mg daily.  *If you need a refill on your cardiac medications before your next appointment, please call your pharmacy*   Follow-Up: At [facility], you and your health needs are our priority.  As part of our continuing mission to provide you with exceptional heart care, we have created designated Provider Care Teams.  These Care Teams include your primary Cardiologist (physician) and Advanced Practice Providers (APPs -  Physician Assistants and Nurse Practitioners) who all work together to provide you with the care you need, when you need it.  We recommend signing up for the patient portal called "MyChart".  Sign up information is provided on this After Visit Summary.  MyChart is used to connect with patients for Virtual Visits (Telemedicine).  Patients are able to view lab/test results, encounter notes, upcoming appointments, etc.  Non-urgent messages can be sent to your provider as well.   To learn more about what you can do with MyChart, go to [email or web address].    Your next appointment:   Please keep your follow-up appointment with Dr. Royann Shivers   You have been referred to LIPID CLINIC with PharmD here in our office.  The format for your next appointment:   In Person

## 2020-03-15 NOTE — Telephone Encounter (Signed)
Patient called to schedule an appointment and was transferred to triage because of symptoms. Patent stated that she fell about 3 weeks ago after having a procedure and hit her head while in the hospital. Patient stated starting about two days ago she noticed that sometimes when she turns her head she gets a funny feeling and feels off balanced. Patient stated when she was in the hospital she was told that when she fell she dislocated some crystals in her ears. Patient stated that she had therapy for the ear issues while in the hospital. Patient stated that she did have a slight headache last night and that is the only time she has had a headache. Patient stated that she would like to come in and be evaluated. Patient stated that she is aware that Dr. [name] is going to be out of the office for a few days and requested Deboraha Sprang NP. After speaking with Dr. [name] and Deboraha Sprang NP patient scheduled for an appointment with Deboraha Sprang NP Friday 03/17/20 at 2:00 pm. Patient was given 911 and ER precautions and she verbalized understanding.

## 2020-03-15 NOTE — Telephone Encounter (Signed)
Thank you :)

## 2020-03-15 NOTE — Telephone Encounter (Signed)
Noted  

## 2020-03-17 ENCOUNTER — Ambulatory Visit: Payer: BC Managed Care – PPO | Admitting: Family Medicine

## 2020-03-17 ENCOUNTER — Other Ambulatory Visit: Payer: Self-pay | Admitting: Physician Assistant

## 2020-03-17 NOTE — Telephone Encounter (Signed)
Patient is calling stating that she is still waiting for these forms from [facility] and is going to be in trouble at work soon. If possible, please rush these completed forms back to [facility]

## 2020-03-20 ENCOUNTER — Ambulatory Visit: Payer: BC Managed Care – PPO | Admitting: Family Medicine

## 2020-03-20 ENCOUNTER — Encounter: Payer: Self-pay | Admitting: Family Medicine

## 2020-03-20 ENCOUNTER — Other Ambulatory Visit: Payer: Self-pay

## 2020-03-20 VITALS — BP 152/80 | HR 83 | Temp 98.3°F | Ht 64.0 in | Wt 137.4 lb

## 2020-03-20 DIAGNOSIS — S0285XD Fracture of orbit, unspecified, subsequent encounter for fracture with routine healing: Secondary | ICD-10-CM

## 2020-03-20 DIAGNOSIS — R42 Dizziness and giddiness: Secondary | ICD-10-CM

## 2020-03-20 DIAGNOSIS — Q670 Congenital facial asymmetry: Secondary | ICD-10-CM | POA: Diagnosis not present

## 2020-03-20 NOTE — Patient Instructions (Signed)
Good to see you today  Continue good fluid intake  I have put in a referral to neurology, you should get a call about an appointment in a day or two, if anything changes acutely- please call the office

## 2020-03-20 NOTE — Progress Notes (Signed)
Subjective:    Patient ID: Felicia [name], female    DOB: [birth year], 59 y.o.   MRN: [id number]  HPI Chief Complaint  Patient presents with  . Dizziness    x 4 weeks. Pt fell at home breaking her left arm and shattering her right front skull orbital space. Pt states that she still has swelling and pain/tenderness. Pt states that she feels as though something is not right at times, unsteady gait. Pt denies confusion, foggy headed. Pt has increased her fluid intake to help with blood pressure and the "whooshing feeling" she gets when she bends over, similar to lightheadedness. Denies visual disturbance.   Continues to feel some tenderness and pain over right eye. Has sensation of swaying but body is not moving. She was admitted 4/23-28 following a syncopal episode. Has episodes of vertigo and was treated by inpatient PT with resolution of spinning. No further syncope. "Head doesn't feel right." more in am and if she gets tired. Concern about increasing her activity due to worry over falling. Slight improvement with increased water intake. Blood sugars running 69-215 (dietary indiscretion x 1). No visual changes, no problems with mentation/ concentration. Feels like abnormal feeling is more superficial.   Home blood pressures- 110s/ 80s. Did not bring in readings today. Has been checking twice a day and giving readings to cardiology.   Review of Systems Per HPI    Objective:   Physical Exam Vitals reviewed.  Constitutional:      General: She is not in acute distress.    Appearance: Normal appearance. She is normal weight. She is not ill-appearing, toxic-appearing or diaphoretic.  HENT:     Head: Normocephalic and atraumatic.     Right Ear: External ear normal.     Left Ear: External ear normal.  Eyes:     Extraocular Movements: Extraocular movements intact.     Conjunctiva/sclera: Conjunctivae normal.     Pupils: Pupils are equal, round, and reactive to light.  Cardiovascular:   Pulses: Normal pulses.     Heart sounds: Normal heart sounds.  Pulmonary:     Effort: Pulmonary effort is normal.     Breath sounds: Normal breath sounds.  Musculoskeletal:     Cervical back: Normal range of motion and neck supple.     Right lower leg: No edema.     Left lower leg: No edema.  Skin:    General: Skin is warm and dry.  Neurological:     Mental Status: She is alert and oriented to person, place, and time.     Motor: No weakness.     Coordination: Coordination normal.     Gait: Gait normal.     Deep Tendon Reflexes: Reflexes normal.     Comments: Right eyebrow does not lift with left. No other facial asymmetry. Cranial nerves otherwise intact.   Psychiatric:        Mood and Affect: Mood normal.        Behavior: Behavior normal.        Thought Content: Thought content normal.        Judgment: Judgment normal.       BP (!) 152/80 (BP Location: Right Arm, Patient Position: Sitting, Cuff Size: Normal)   Pulse 83   Temp 98.3 F (36.8 C) (Temporal)   Ht 5\' 4"  (1.626 m)   Wt 137 lb 6.4 oz (62.3 kg)   SpO2 98%   BMI 23.58 kg/m  Wt Readings from Last 3 Encounters:  03/20/20 137  lb 6.4 oz (62.3 kg)  03/10/20 140 lb (63.5 kg)  03/01/20 142 lb 11.2 oz (64.7 kg)       Assessment & Plan:  Discussed with Dr. Selena Batten 1. Lightheadedness - Unclear etiology, seems different than her vertigo that was treated at hospital.  - Ambulatory referral to Neurology  2. Closed fracture of orbit with routine healing, subsequent encounter - Ambulatory referral to Neurology  3. Facial asymmetry - unclear if she has nerve damage from orbital fracture - Ambulatory referral to Neurology  -She has upcoming visit with her PCP next week.   This visit occurred during the SARS-CoV-2 public health emergency.  Safety protocols were in place, including screening questions prior to the visit, additional usage of staff PPE, and extensive cleaning of exam room while observing appropriate contact  time as indicated for disinfecting solutions.    Olean Ree, FNP-BC  [facility], [facility]  03/20/2020 6:03 PM

## 2020-03-21 ENCOUNTER — Ambulatory Visit: Payer: BC Managed Care – PPO | Admitting: Cardiovascular Disease

## 2020-03-21 NOTE — Telephone Encounter (Signed)
Spoke with patient and advised that I did send her paperwork to be completed by Dr. Royann Shivers because he is aware of her extensive care received while in the hospital there at [facility]. I will make sure to let his nurse know that it is heading her way and forward this to both nurse and provider. She was appreciative for the call back with no further questions at this time.

## 2020-03-21 NOTE — Telephone Encounter (Signed)
[employer] forms returned and placed in nurse box

## 2020-03-21 NOTE — Telephone Encounter (Signed)
Spoke with patient and she states that her employer has not received her forms back and she also has not been able to get her short term disability submitted either. Advised that I would follow up on this and would be in contact with her once I make a few calls. She was very appreciative for the call with no further questions at this time.

## 2020-03-22 ENCOUNTER — Other Ambulatory Visit: Payer: Self-pay

## 2020-03-22 ENCOUNTER — Ambulatory Visit (INDEPENDENT_AMBULATORY_CARE_PROVIDER_SITE_OTHER): Payer: BC Managed Care – PPO | Admitting: Neurology

## 2020-03-22 ENCOUNTER — Encounter: Payer: Self-pay | Admitting: Neurology

## 2020-03-22 VITALS — BP 159/86 | HR 88 | Ht 64.0 in | Wt 134.0 lb

## 2020-03-22 DIAGNOSIS — R29810 Facial weakness: Secondary | ICD-10-CM

## 2020-03-22 DIAGNOSIS — S0230XD Fracture of orbital floor, unspecified side, subsequent encounter for fracture with routine healing: Secondary | ICD-10-CM | POA: Diagnosis not present

## 2020-03-22 DIAGNOSIS — R2689 Other abnormalities of gait and mobility: Secondary | ICD-10-CM

## 2020-03-22 DIAGNOSIS — G510 Bell's palsy: Secondary | ICD-10-CM

## 2020-03-22 DIAGNOSIS — W19XXXS Unspecified fall, sequela: Secondary | ICD-10-CM

## 2020-03-22 DIAGNOSIS — Z9181 History of falling: Secondary | ICD-10-CM

## 2020-03-22 NOTE — Progress Notes (Signed)
Subjective:    Patient ID: Felicia [name] is a 59 y.o. female.  HPI     Felicia Foley, MD, PhD [facility] [address], [address] [address] [place], [place] [address]  Dear Felicia [name],  I saw your patient, Felicia [name], plan you can request in the neurologic clinic today for initial consultation of her lightheadedness and dizziness.  The patient is unaccompanied today.  As you know, Felicia [name] is a 59 year old right-handed woman with an underlying medical history of coronary artery disease with status post CABG in April 2021, irritable bowel syndrome, hyperlipidemia, reflux disease, hypertension, anemia, allergies, osteopenia, and type 1 diabetes, who had a recent syncopal spell and fall with injuries.  She was hospitalized in April.  I reviewed the hospital records.  She was found to have orthostatic hypotension.  She fractured her left upper extremity with a distal radius fracture and mildly displaced ulnar styloid fracture.  She also sustained a right orbital blowout fracture.  She had a head CT without contrast on 02/26/2020 and I reviewed the results: IMPRESSION: No acute intracranial abnormality.   Nondisplaced comminuted fractures through the lateral orbital wall, lateral maxillary wall common inferior orbital floor. No displacement of the inferior orbital fat.   Soft tissue swelling seen over the right frontal skull and right periorbital soft tissues.   I reviewed your office note from 03/20/2020.    She reports ongoing issues with lightheadedness.  She reports sometimes especially when she bends over she feels like there is a whooshing sound in her head.  She denies any visual symptoms such as diplopia.  She has noticed inability to raise her right eyebrow.  She is wondering if this will improve.  She has not had any difficulty with eye motility and no significant pain, no headaches, no sudden onset of one-sided weakness or numbness or tingling or droopy face or  slurring of speech.  She is tracking her blood pressure at home, she tries to hydrate well and blood pressure numbers on the lower end typically but she also has some outliers with higher numbers. She reports some snoring, no apneic pauses, denies any morning headaches but has nocturia, on average twice per night.  She had some vertigo symptoms especially when she was in the hospital and she had some physical therapy for this which helped.  She is supposed to start cardiac rehab soon.  She has a cast on her left forearm and hopefully this cast will be removed on 04/04/2020.  Her Past Medical History Is Significant For: Past Medical History:  Diagnosis Date  . Allergy   . Anemia    past history long ago  . Borderline HTN (hypertension)   . CAD (coronary artery disease)    a. 02/2020 Cath: LM nl, LAD large, 90p, 47m, D1 90p, LCX nl, OM 60-70p, RCA dominant w/ dampening upon engagement of ostium. 70p/m, RPDA 50. EF 55-65%.  . Disorder of kidney    has abd kidney (right)  . GERD (gastroesophageal reflux disease)   . History of diabetic gastroparesis   . History of echocardiogram    a. 01/2013 Echo: EF 55-65%, no rwma. Mild MR. Nl RV fxn.  Marland Kitchen HLD (hyperlipidemia)   . Hx: UTI (urinary tract infection)   . IBS (irritable bowel syndrome)   . Nephrolithiasis 2005  . Osteopenia 2012   per pt  . Systolic murmur 01/2013   mild mitral insuff  . Type 1 diabetes mellitus with diabetic retinopathy without macular edema (  HCC)    dx at 59 y/o, background retinopathy    Her Past Surgical History Is Significant For: Past Surgical History:  Procedure Laterality Date  . CESAREAN SECTION  1985  . COLONOSCOPY  07/2016   WNL (Pyrtle)  . CORONARY ARTERY BYPASS GRAFT N/A 02/21/2020   Procedure: CORONARY ARTERY BYPASS GRAFTING (CABG) times five using left internal mammary and right leg saphenous vein;  Surgeon: Corliss Skains, MD;  Location: [facility] OR;  Service: Open Heart Surgery;  Laterality: N/A;  .  ESOPHAGOGASTRODUODENOSCOPY  07/2016   LA Grade C reflux esophagitis, concern for stasis/dysmotility (Pyrtle)  . LAPAROSCOPIC APPENDECTOMY N/A 09/01/2015   Procedure: APPENDECTOMY LAPAROSCOPIC;  Surgeon: Jimmye Norman, MD;  Location: [facility] OR;  Service: General;  Laterality: N/A;  . LEFT HEART CATH AND CORONARY ANGIOGRAPHY N/A 02/17/2020   Procedure: LEFT HEART CATH AND CORONARY ANGIOGRAPHY;  Surgeon: Antonieta Iba, MD;  Location: [facility] INVASIVE CV LAB;  Service: Cardiovascular;  Laterality: N/A;  . LEFT HEART CATH AND CORS/GRAFTS ANGIOGRAPHY N/A 02/29/2020   Procedure: LEFT HEART CATH AND CORS/GRAFTS ANGIOGRAPHY;  Surgeon: [name], MD;  Location: [facility] INVASIVE CV LAB;  Service: Cardiovascular;  Laterality: N/A;  . ORIF FEMUR FRACTURE Left 1996   Corrected by patient in history 2016  . PARTIAL HYSTERECTOMY  2000   paps by [facility] [place]  . TEE WITHOUT CARDIOVERSION N/A 02/21/2020   Procedure: TRANSESOPHAGEAL ECHOCARDIOGRAM (TEE);  Surgeon: Corliss Skains, MD;  Location: [facility] OR;  Service: Open Heart Surgery;  Laterality: N/A;  . TONSILLECTOMY AND ADENOIDECTOMY  1975  . TRIGGER FINGER RELEASE Left 12/2010   thumb    Her Family History Is Significant For: Family History  Problem Relation Age of Onset  . Diabetes Maternal Grandmother   . Heart failure Maternal Grandmother   . COPD Maternal Grandmother        smoker  . Thyroid disease Maternal Grandmother   . Vascular Disease Maternal Grandmother   . Colon cancer Maternal Grandfather   . Colon polyps Mother   . Breast cancer Other   . CAD Brother 52       stent  . Esophageal cancer Neg Hx   . Rectal cancer Neg Hx   . Stomach cancer Neg Hx     Her Social History Is Significant For: Social History   Socioeconomic History  . Marital status: Married    Spouse name: Not on file  . Number of children: 1  . Years of education: 78  . Highest education level: Bachelor's degree (e.g., BA, AB, BS)  Occupational  History  . Occupation: HR Chemical engineer: OTHER    Comment: Scientist, research (medical)  Tobacco Use  . Smoking status: Never Smoker  . Smokeless tobacco: Never Used  Substance and Sexual Activity  . Alcohol use: No    Alcohol/week: 0.0 standard drinks  . Drug use: No  . Sexual activity: Not on file  Other Topics Concern  . 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  Has one daughter.  Education: college.      1 dog   Social Determinants of Corporate investment banker Strain:   . Difficulty of Paying Living Expenses:   Food Insecurity:   . Worried About Programme researcher, broadcasting/film/video in the Last Year:   . [employer] of Food in the Last  Year:   Transportation Needs:   . Freight forwarderLack of Transportation (Medical):   Marland Kitchen. Lack of Transportation (Non-Medical):   Physical Activity:   . Days of Exercise per Week:   . Minutes of Exercise per Session:   Stress:   . Feeling of Stress :   Social Connections:   . Frequency of Communication with Friends and Family:   . Frequency of Social Gatherings with Friends and Family:   . Attends Religious Services:   . Active Member of Clubs or Organizations:   . Attends BankerClub or Organization Meetings:   Marland Kitchen. Marital Status:     Her Allergies Are:  Allergies  Allergen Reactions  . Protonix [Pantoprazole] Swelling    Facial Swelling  . Toujeo Solostar [Insulin Glargine] Shortness Of Breath  . Lactose Intolerance (Gi)     GI  . Magnesium-Containing Compounds Swelling and Other (See Comments)    limbs  . Adhesive [Tape] Rash and Other (See Comments)    Band aids  :   Her Current Medications Are:  Outpatient Encounter Medications as of 03/22/2020  Medication Sig  . aspirin EC 325 MG EC tablet Take 1 tablet (325 mg total) by mouth daily.  Marland Kitchen. esomeprazole (NEXIUM) 40 MG capsule Take 1 capsule (40 mg total) by mouth daily. Patient needs office visit for further refills  . ezetimibe  (ZETIA) 10 MG tablet Take 1 tablet (10 mg total) by mouth daily.  Marland Kitchen. glucagon (GLUCAGON EMERGENCY) 1 MG injection Inject 1 mg into the muscle once as needed (in case of severe hypoglycemia).  . Insulin Glargine (LANTUS SOLOSTAR) 100 UNIT/ML Solostar Pen Inject 12 Units into the skin daily. (Patient taking differently: Inject 8-10 Units into the skin See admin instructions. Inject 10 units into the skin in the morning and 8 units in the evening)  . Insulin Pen Needle (NOVOFINE) 32G X 6 MM MISC USE TO INJECT INSULIN 4 TIMES A DAY  . insulin regular (NOVOLIN R) 100 units/mL injection INJECT 6 UNITS TOTAL INTO THE SKIN 3 (THREE) TIMES DAILY BEFORE MEALS. (Patient taking differently: Inject 1-10 Units into the skin See admin instructions. For glucose 150-210 add 1 additional unit, per scale 211-270= 2 units, 271-330= 3 units, 331-390= 4 units  For breakfast and dinner, administer 1 unit for every 10 grams of carbs. For snacks and dinner, administer 1 unit for every 20 grams of carbs.)  . Insulin Syringe-Needle U-100 30G X 5/16" 0.3 ML MISC Use to inject insulin 6 times a day.  . lovastatin (MEVACOR) 20 MG tablet Take 1 tablet (20 mg total) by mouth at bedtime.  . Multiple Vitamin (MULTIVITAMIN) tablet Take 1 tablet by mouth daily.     No facility-administered encounter medications on file as of 03/22/2020.  :   Review of Systems:  Out of a complete 14 point review of systems, all are reviewed and negative with the exception of these symptoms as listed below:  Review of Systems  Neurological:       Pt is recovering from recent fall she had about 4 weeks ago. Pt reports on 02/25/2020 she was home recovering from bypass surgery when her BP dropped and she passed out she fell breaking her right arm and fractured right eye orbit. She sts during a check up with her PCP her right eye brow would not raise upward during the neuro exam.. Pt reports occasional lightheadedness since the fall she has completed  vestibular therapy.     Objective:  Neurological Exam  Physical Exam  Physical Examination:   Vitals:   03/22/20 1401  BP: (!) 159/86  Pulse: 88    General Examination: The patient is a very pleasant 59 y.o. female in no acute distress. She appears well-developed and well-nourished and well groomed.   HEENT: Normocephalic, atraumatic, pupils are equal, round and reactive to light and accommodation. Extraocular tracking is good without limitation to gaze excursion or nystagmus noted. Normal smooth pursuit is noted. Tympanic membranes are clear bilaterally. Face is mildly asymmetric with inability to wrinkle right forehead, otherwise no lower facial weakness, no evidence of Bell's phenomenon.  She has good hearing.  She has good facial sensation throughout.  She has no deviation of her tongue, tongue protrudes centrally in palate elevates symmetrically.    Chest: Clear to auscultation without wheezing, rhonchi or crackles noted.  Heart: S1+S2+0, regular and normal without murmurs, rubs or gallops noted.   Abdomen: Soft, non-tender and non-distended with normal bowel sounds appreciated on auscultation.  Extremities: There is no pitting edema in the distal lower extremities bilaterally. Pedal pulses are intact.  Skin: Warm and dry without trophic changes noted.  Musculoskeletal: exam reveals cast left arm.    Neurologically:  Mental status: The patient is awake, alert and oriented in all 4 spheres. Her immediate and remote memory, attention, language skills and fund of knowledge are appropriate. There is no evidence of aphasia, agnosia, apraxia or anomia. Speech is clear with normal prosody and enunciation. Thought process is linear. Mood is normal and affect is normal.  Cranial nerves II - XII are as described above under HEENT exam. In addition: shoulder shrug is normal with equal shoulder height noted. Motor exam: Normal bulk, strength and tone is noted. There is no drift, tremor or  rebound. Romberg is negative. Reflexes are 2+ throughout, except left arm was not tested. Babinski: Toes are flexor bilaterally. Fine motor skills and coordination: intact with normal finger taps, normal hand movements, normal rapid alternating patting, normal foot taps and normal foot agility.  Cerebellar testing: No dysmetria or intention tremor on finger to nose testing. Heel to shin is unremarkable bilaterally. There is no truncal or gait ataxia.  Sensory exam: intact to light touch, vibration, temperature sense in the upper and lower extremities.  Gait, station and balance: She stands easily. No veering to one side is noted. No leaning to one side is noted. Posture is age-appropriate and stance is narrow based. Gait shows normal stride length and normal pace, but she walks perhaps slightly cautiously, preserved arm swing is noted with the exception that she holds up the left arm with a cast closer to her body.  She has no shuffling.  Assessment and Plan:   In summary, Felicia [name] is a very pleasant 59 y.o.-year old female with an underlying medical history of coronary artery disease with status post CABG in April 2021, irritable bowel syndrome, hyperlipidemia, reflux disease, hypertension, anemia, allergies, osteopenia, and type 1 diabetes, who presents for evaluation of her intermittent lightheadedness.  She had a syncopal spell in April, shortly after her CABG.  She was in the hospital as she hurt herself, she sustained a right orbital fracture and also left distal radius fracture.  She does not have any visual symptoms, neurological exam is nonfocal thankfully with the exception of left forehead weakness on the right side.  She has no Bell's palsy.  She has no recurrent headaches.  She has felt a whooshing sensation in her head.  She is largely reassured with today's examination.  She will likely notice improvement in her facial movements with time.  She is advised that it can take weeks or months  for nerve damage to improve, if she sustained any nerve damage from her extensive orbital fracture.  She has an otherwise benign exam, she does admit that since her fall she has been afraid of falling which is very understandable.  We talked about the importance of continuing with exercise and hopefully with rehab she will also gain some of her confidence back in walking.  I do not believe she needs a brain MRI, she had a CTH before.  I do suggest that we go ahead and look at her MR angiogram of the head to make sure there has been no obvious change in her brain blood vessels.  She is agreeable with this plan, I will order a brain MRA without contrast and we will call her to schedule this soon and also keep her posted as to the test results.  So long as her results are benign, I would be happy to see her back on an as-needed basis.  She is reminded to stay well-hydrated, change positions slowly and continue to work on strengthening exercises, follow-up with orthopedics as planned and cardiology as planned.  I answered all her questions today and she was in agreement.   Thank you very much for allowing me to participate in the care of this nice patient. If I can be of any further assistance to you please do not hesitate to call me at [phone].  Sincerely,   Star Age, MD, PhD

## 2020-03-22 NOTE — Patient Instructions (Signed)
You do have mild weakness of the right forehead and asymmetry when you try to wrinkle your forehead.  Other than that, you have no evidence of facial weakness and your eye movements are good, you have no vision symptoms.  If you have any problems with visual disturbance including double vision, please see an eye doctor soon as possible.  Your neurological exam otherwise is good thankfully.  Please continue to work on your hydration and monitor your blood pressure, stand up slowly and get your bearings first.  He will start cardiac rehab hopefully soon.  I think it will help as well.  As discussed, we will proceed with a MR angiogram of the head to look at the blood vessels of your brain.  I do not believe you need a brain MRI which looks at the structure of the brain.  We will call you with the test results, if the results are benign you can follow-up with me on an as-needed basis.  It will likely take time for your forehead weakness to improve and you may potentially regain all your facial strength over time, it can take weeks or months even from nerve damage to heal.

## 2020-03-23 ENCOUNTER — Ambulatory Visit (INDEPENDENT_AMBULATORY_CARE_PROVIDER_SITE_OTHER): Payer: BC Managed Care – PPO | Admitting: Pharmacist

## 2020-03-23 ENCOUNTER — Other Ambulatory Visit: Payer: Self-pay

## 2020-03-23 VITALS — BP 130/70 | HR 88 | Temp 98.0°F | Resp 16 | Ht 64.0 in | Wt 138.0 lb

## 2020-03-23 DIAGNOSIS — I2510 Atherosclerotic heart disease of native coronary artery without angina pectoris: Secondary | ICD-10-CM | POA: Diagnosis not present

## 2020-03-23 DIAGNOSIS — E1069 Type 1 diabetes mellitus with other specified complication: Secondary | ICD-10-CM

## 2020-03-23 DIAGNOSIS — E785 Hyperlipidemia, unspecified: Secondary | ICD-10-CM

## 2020-03-23 NOTE — Progress Notes (Signed)
Patient ID: Felicia [name]                 DOB: January 09, 1961                    MRN: [id number]     HPI: Felicia [name] is a 59 y.o. female patient referred to lipid clinic by Micah Flesher. PMH is significant for T1DM, HTN, CAD, nSTEMI, CABG x 5 (02/21/20).  Hospitalized for CABG on 4/19, discharged on 4/22, then readmitted on 4/23 following a syncopal episode in which she fell and broke her right arm and right eye orbit.  Cath lab showed 4/5 bypass grafts had failed.  RCA intervention is planned pending chest pain.  Hypertensives were stopped (BB and ACEi) and patient discharged on aspirin 325mg  and pravastatin.  Pravastatin was then discontinued due to muscle pain and switched to lovastatin.  Patient and husband today present in good spirits.  Continues to report muscle pain with lovastatin but is fighting through it.  Reports blood pressure and blood sugar have been controlled.  Is interested in starting PCSK9 inhibitors after hearing about its LDL lowering capabilities.  Has vast experience injecting at home.     Current Medications: lovastatin 20 mg, Zetia 10 mg Intolerances: pravastatin (muscle discomfort) Risk Factors: Hx of MI, CAD, DM, HTN LDL goal: <70   Family History: Brother: CAD, Maternal grandmother: DM, HF  Social History: Very conscious of diet.  Due to gastroparesis, can not eat large meals.  Is used to label reading and carb counting.  Labs:  TC 196, Trigs 160, HDL 58, LDL 106 (09/23/19) while on pravastatin  Past Medical History:  Diagnosis Date  . Allergy   . Anemia    past history long ago  . Borderline HTN (hypertension)   . CAD (coronary artery disease)    a. 02/2020 Cath: LM nl, LAD large, 90p, 67m, D1 90p, LCX nl, OM 60-70p, RCA dominant w/ dampening upon engagement of ostium. 70p/m, RPDA 50. EF 55-65%.  . Disorder of kidney    has abd kidney (right)  . GERD (gastroesophageal reflux disease)   . History of diabetic gastroparesis   . History of echocardiogram    a. 01/2013 Echo: EF 55-65%, no rwma. Mild MR. Nl RV fxn.  02/2013 HLD (hyperlipidemia)   . Hx: UTI (urinary tract infection)   . IBS (irritable bowel syndrome)   . Nephrolithiasis 2005  . Osteopenia 2012   per pt  . Systolic murmur 01/2013   mild mitral insuff  . Type 1 diabetes mellitus with diabetic retinopathy without macular edema (HCC)    dx at 59 y/o, background retinopathy    Current Outpatient Medications on File Prior to Visit  Medication Sig Dispense Refill  . aspirin EC 325 MG EC tablet Take 1 tablet (325 mg total) by mouth daily. 30 tablet 0  . esomeprazole (NEXIUM) 40 MG capsule Take 1 capsule (40 mg total) by mouth daily. Patient needs office visit for further refills 90 capsule 0  . ezetimibe (ZETIA) 10 MG tablet Take 1 tablet (10 mg total) by mouth daily. 30 tablet 2  . glucagon (GLUCAGON EMERGENCY) 1 MG injection Inject 1 mg into the muscle once as needed (in case of severe hypoglycemia). 1 each 12  . Insulin Glargine (LANTUS SOLOSTAR) 100 UNIT/ML Solostar Pen Inject 12 Units into the skin daily. (Patient taking differently: Inject 8-10 Units into the skin See admin instructions. Inject 10 units into the skin in the morning  and 8 units in the evening) 5 pen 11  . Insulin Pen Needle (NOVOFINE) 32G X 6 MM MISC USE TO INJECT INSULIN 4 TIMES A DAY 200 each 1  . insulin regular (NOVOLIN R) 100 units/mL injection INJECT 6 UNITS TOTAL INTO THE SKIN 3 (THREE) TIMES DAILY BEFORE MEALS. (Patient taking differently: Inject 1-10 Units into the skin See admin instructions. For glucose 150-210 add 1 additional unit, per scale 211-270= 2 units, 271-330= 3 units, 331-390= 4 units  For breakfast and dinner, administer 1 unit for every 10 grams of carbs. For snacks and dinner, administer 1 unit for every 20 grams of carbs.) 60 mL 1  . Insulin Syringe-Needle U-100 30G X 5/16" 0.3 ML MISC Use to inject insulin 6 times a day. 600 each 11  . lovastatin (MEVACOR) 20 MG tablet Take 1 tablet (20 mg total)  by mouth at bedtime. 30 tablet 6  . Multiple Vitamin (MULTIVITAMIN) tablet Take 1 tablet by mouth daily.       No current facility-administered medications on file prior to visit.    Allergies  Allergen Reactions  . Protonix [Pantoprazole] Swelling    Facial Swelling  . Toujeo Solostar [Insulin Glargine] Shortness Of Breath  . Lactose Intolerance (Gi)     GI  . Magnesium-Containing Compounds Swelling and Other (See Comments)    limbs  . Adhesive [Tape] Rash and Other (See Comments)    Band aids    Assessment/Plan:  1. Hyperlipidemia - Patient's most recent LDL was 106 which is above goal <70.  Due to patient's risk factors, would be a good candidate for PCSK9 inhibitors.  However counseled patient there is not much data involving Repatha and Type 1 diabetics.  Counseled patient on mechanism of action of Repatha as well as storage, warming to room temperature, site selection, and administration.  Patient prefers to have 1 month lipid panel checks due to being concerned about LDL levels.  Advised patient to discontinue lovastatin if Repatha is approved due to muscle pain.  Gave patient educational handouts on Repatha as well as copay card.  Follow up in 1 month following first lipid panel.  Karren Cobble, PharmD, BCACP, [place] [address] [place], [place] [address] Phone: [phone]; Fax: [phone] 03/23/2020 11:51 AM

## 2020-03-27 ENCOUNTER — Telehealth: Payer: Self-pay | Admitting: *Deleted

## 2020-03-27 NOTE — Telephone Encounter (Signed)
The patient called in inquiring about her FMLA paperwork. This had been sent to Dr. Mariah Milling since he was the primary cardiologist. The patient asked to be switched to Dr. Royann Shivers only because it is close to work for her.   Dr. Royann Shivers has been out of the office for a few weeks so has been unable to sign them  The paperwork has been left in Angie Duke's, PA box for her to sign since she has seen the patient last.   The patient has asked that she be called when they are completed for a copy of them. She stated that she now has only 5 days to get them complete.

## 2020-03-28 NOTE — Telephone Encounter (Signed)
The FMLA papers have been signed. The patient has been notified and will pick up a copy on 5/26. She has been advised to let us know if she needs anything further.

## 2020-03-28 NOTE — Telephone Encounter (Signed)
Follow up    Pt is calling back to see if paper work is ready    Please call

## 2020-03-30 ENCOUNTER — Encounter: Payer: Self-pay | Admitting: Thoracic Surgery (Cardiothoracic Vascular Surgery)

## 2020-03-30 ENCOUNTER — Other Ambulatory Visit: Payer: Self-pay

## 2020-03-30 ENCOUNTER — Ambulatory Visit (INDEPENDENT_AMBULATORY_CARE_PROVIDER_SITE_OTHER): Payer: Self-pay | Admitting: Thoracic Surgery (Cardiothoracic Vascular Surgery)

## 2020-03-30 DIAGNOSIS — Z951 Presence of aortocoronary bypass graft: Secondary | ICD-10-CM

## 2020-03-30 DIAGNOSIS — S52592A Other fractures of lower end of left radius, initial encounter for closed fracture: Secondary | ICD-10-CM | POA: Diagnosis not present

## 2020-03-30 NOTE — Progress Notes (Signed)
      [address]       [place] [address]             [phone]        Felicia [name] [facility] Medical Record #[id number] Date of Birth: 01/12/1961  Referring: Antonieta Iba, MD Primary Care: [name], MD Primary Cardiologist:Mihai Croitoru, MD  Reason for visit:   follow-up  History of Present Illness:     Felicia [name] presents for second follow-up. Overall she is doing well. She does continue to have some symptoms of vertigo. She denies any chest pain or anginal symptoms.  Physical Exam: There were no vitals taken for this visit.  Alert NAD Incision clean.  Sternum stable Abdomen soft, ND No peripheral edema   Diagnostic Studies & Laboratory data:     Assessment / Plan:   59 year old female with type 1 diabetes, status post CABG x5 with early vein graft failure. Doing well. Cleared for cardiac rehab Return as needed   Corliss Skains 03/30/2020 4:59 PM

## 2020-03-31 ENCOUNTER — Ambulatory Visit (INDEPENDENT_AMBULATORY_CARE_PROVIDER_SITE_OTHER): Payer: BC Managed Care – PPO | Admitting: Family Medicine

## 2020-03-31 ENCOUNTER — Ambulatory Visit: Payer: Self-pay | Admitting: Thoracic Surgery (Cardiothoracic Vascular Surgery)

## 2020-03-31 ENCOUNTER — Encounter: Payer: Self-pay | Admitting: Family Medicine

## 2020-03-31 VITALS — BP 140/76 | HR 85 | Temp 97.5°F | Ht 64.0 in | Wt 135.5 lb

## 2020-03-31 DIAGNOSIS — H8110 Benign paroxysmal vertigo, unspecified ear: Secondary | ICD-10-CM | POA: Diagnosis not present

## 2020-03-31 DIAGNOSIS — S52502G Unspecified fracture of the lower end of left radius, subsequent encounter for closed fracture with delayed healing: Secondary | ICD-10-CM | POA: Diagnosis not present

## 2020-03-31 DIAGNOSIS — E785 Hyperlipidemia, unspecified: Secondary | ICD-10-CM

## 2020-03-31 DIAGNOSIS — I10 Essential (primary) hypertension: Secondary | ICD-10-CM

## 2020-03-31 DIAGNOSIS — R42 Dizziness and giddiness: Secondary | ICD-10-CM | POA: Diagnosis not present

## 2020-03-31 DIAGNOSIS — R55 Syncope and collapse: Secondary | ICD-10-CM

## 2020-03-31 DIAGNOSIS — E1069 Type 1 diabetes mellitus with other specified complication: Secondary | ICD-10-CM

## 2020-03-31 DIAGNOSIS — S52602G Unspecified fracture of lower end of left ulna, subsequent encounter for closed fracture with delayed healing: Secondary | ICD-10-CM

## 2020-03-31 DIAGNOSIS — S0285XG Fracture of orbit, unspecified, subsequent encounter for fracture with delayed healing: Secondary | ICD-10-CM

## 2020-03-31 DIAGNOSIS — Z951 Presence of aortocoronary bypass graft: Secondary | ICD-10-CM

## 2020-03-31 NOTE — Assessment & Plan Note (Addendum)
Healing well. Appreciate ortho care. Planning to start OT next week for this.

## 2020-03-31 NOTE — Assessment & Plan Note (Signed)
Healing well.

## 2020-03-31 NOTE — Assessment & Plan Note (Signed)
5v cabg 02/2020 with early vein graft failure 03/2020 Planned ongoing medical management

## 2020-03-31 NOTE — Progress Notes (Signed)
This visit was conducted in person.  BP 140/76 (BP Location: Right Arm, Patient Position: Sitting, Cuff Size: Normal)   Pulse 85   Temp (!) 97.5 F (36.4 C) (Temporal)   Ht 5\' 4"  (1.626 m)   Wt 135 lb 8 oz (61.5 kg)   SpO2 98%   BMI 23.26 kg/m    CC: hosp f/u visit  Subjective:    Patient ID: Felicia [name], female    DOB: 1961-10-06, 59 y.o.   MRN: [id number]  HPI: Felicia [name] is a 59 y.o. female presenting on 03/31/2020 for Hospitalization Follow-up   Recent complicated hospital course for unstable angina s/p 5v CABG complicated by orthostatic syncope (too much BP) with distal radial fracture and R orbital fracture. she also had early vein graft failure. She had subsequent R facial weakness and was evaluated by neurology - thought due to orbital fracture, should improve over time. She had brain MRA ordered - pending. She is to start cardiac rehab in 2 wks (delayed while wrist heals).   Ongoing lightheadedness "swaying feeling" - unsteady feeling, worse with closing eyes. BP checks during this time not consistently high or low. Has been sleeping in recliner. She did have episode of vertigo last Friday when she turned over in recliner. Denies paresthesias. She did have vestibular rehab during second hospitalization with benefit. No tinnitus or hearing changes. No preceding viral URI symptoms. She is staying well hydrated with plenty of water.   Planned continued tight medical management of DM, HTN, HLD.   L radial fracture - saw ortho this week s/p cast removal, now using wrist brace, planning to start PT next week.   Cards - seeing Dr Saturday as well as lipid pharm clinic through cardiology office. Known statin intolerance, managing to tolerate lovastatin better than others. Looking into repatha.      Relevant past medical, surgical, family and social history reviewed and updated as indicated. Interim medical history since our last visit reviewed. Allergies and medications  reviewed and updated. Outpatient Medications Prior to Visit  Medication Sig Dispense Refill  . aspirin EC 325 MG EC tablet Take 1 tablet (325 mg total) by mouth daily. 30 tablet 0  . esomeprazole (NEXIUM) 40 MG capsule Take 1 capsule (40 mg total) by mouth daily. Patient needs office visit for further refills 90 capsule 0  . ezetimibe (ZETIA) 10 MG tablet Take 1 tablet (10 mg total) by mouth daily. 30 tablet 2  . glucagon (GLUCAGON EMERGENCY) 1 MG injection Inject 1 mg into the muscle once as needed (in case of severe hypoglycemia). 1 each 12  . Insulin Glargine (LANTUS SOLOSTAR) 100 UNIT/ML Solostar Pen Inject 12 Units into the skin daily. (Patient taking differently: Inject 8-10 Units into the skin See admin instructions. Inject 10 units into the skin in the morning and 8 units in the evening) 5 pen 11  . Insulin Pen Needle (NOVOFINE) 32G X 6 MM MISC USE TO INJECT INSULIN 4 TIMES A DAY 200 each 1  . insulin regular (NOVOLIN R) 100 units/mL injection INJECT 6 UNITS TOTAL INTO THE SKIN 3 (THREE) TIMES DAILY BEFORE MEALS. (Patient taking differently: Inject 1-10 Units into the skin See admin instructions. For glucose 150-210 add 1 additional unit, per scale 211-270= 2 units, 271-330= 3 units, 331-390= 4 units  For breakfast and dinner, administer 1 unit for every 10 grams of carbs. For snacks and dinner, administer 1 unit for every 20 grams of carbs.) 60 mL 1  .  Insulin Syringe-Needle U-100 30G X 5/16" 0.3 ML MISC Use to inject insulin 6 times a day. 600 each 11  . lovastatin (MEVACOR) 20 MG tablet Take 1 tablet (20 mg total) by mouth at bedtime. 30 tablet 6  . Multiple Vitamin (MULTIVITAMIN) tablet Take 1 tablet by mouth daily.       No facility-administered medications prior to visit.     Per HPI unless specifically indicated in ROS section below Review of Systems Objective:  BP 140/76 (BP Location: Right Arm, Patient Position: Sitting, Cuff Size: Normal)   Pulse 85   Temp (!) 97.5 F (36.4  C) (Temporal)   Ht 5\' 4"  (1.626 m)   Wt 135 lb 8 oz (61.5 kg)   SpO2 98%   BMI 23.26 kg/m   Wt Readings from Last 3 Encounters:  03/31/20 135 lb 8 oz (61.5 kg)  03/23/20 138 lb (62.6 kg)  03/22/20 134 lb (60.8 kg)      Physical Exam Vitals and nursing note reviewed.  Constitutional:      Appearance: Normal appearance. She is not ill-appearing.  Cardiovascular:     Rate and Rhythm: Normal rate and regular rhythm.     Pulses: Normal pulses.     Heart sounds: Normal heart sounds. No murmur.  Pulmonary:     Effort: Pulmonary effort is normal. No respiratory distress.     Breath sounds: Normal breath sounds. No wheezing, rhonchi or rales.  Neurological:     Mental Status: She is alert.     Comments:  Limited R eyebrow elevation after orbital fracture EOMI - minimal nystagmus with end lateral gaze bilaterally Dix hallpike positive on L s/p modified epley maneuver Did not want to try dix hallpike on R   Psychiatric:        Mood and Affect: Mood normal.        Behavior: Behavior normal.        CT HEAD WITHOUT CONTRAST IMPRESSION: No acute intracranial abnormality. Nondisplaced comminuted fractures through the lateral orbital wall, lateral maxillary wall common inferior orbital floor. No displacement of the inferior orbital fat. Soft tissue swelling seen over the right frontal skull and right periorbital soft tissues Electronically Signed   By: Prudencio Pair M.D.   On: 02/26/2020 01:03 Assessment & Plan:  This visit occurred during the SARS-CoV-2 public health emergency.  Safety protocols were in place, including screening questions prior to the visit, additional usage of staff PPE, and extensive cleaning of exam room while observing appropriate contact time as indicated for disinfecting solutions.   Problem List Items Addressed This Visit    Syncope    Thought orthostatic due to antihypertensives, now off medication.       S/P CABG (coronary artery bypass graft)    5v  cabg 02/2020 with early vein graft failure 03/2020 Planned ongoing medical management      Radius and ulna distal fracture    Healing well. Appreciate ortho care. Planning to start OT next week for this.       Orbital fracture ([place])    Healing well.      Hyperlipidemia due to type 1 diabetes mellitus ([place])    Now sees lipid clinic, looking into Repatha.  Currently tolerating lovastatin better than other statins previously tried.       Essential hypertension    Currently off treatment.      Dizziness - Primary    Describes unsteadiness "swaying feeling" intermittently as well as less frequent episodes of vertigo with  sudden head movement, both starting after orthostatic syncope with facial trauma.  Discussed positional vertigo vs possible concussion after fall as possible causes of ongoing unsteadiness. She does not endorse vision change or headache post-syncope. She did have recent reassuring neurological evaluation (Dr Frances Furbish) and has planned brain MRA pending.  She does have positive dix hallpike on left today - suggesting component of BPPV, and did benefit from vestibular rehab during latest hospitalization. Will treat with modified epley maneuvers at home (handout provided) as well as refer to ENT for possible further vestibular rehab. Pt agrees with plan.        Other Visit Diagnoses    Benign paroxysmal positional vertigo, unspecified laterality       Relevant Orders   Ambulatory referral to ENT       No orders of the defined types were placed in this encounter.  Orders Placed This Encounter  Procedures  . Ambulatory referral to ENT    Referral Priority:   Routine    Referral Type:   Consultation    Referral Reason:   Specialty Services Required    Requested Specialty:   Otolaryngology    Number of Visits Requested:   1    Patient instructions: Testing was positive for positional vertigo on left. I would like to refer you to ENT for further evaluation and to discuss  possible vestibular rehab.   Follow up plan: Return if symptoms worsen or fail to improve.  [name], MD

## 2020-03-31 NOTE — Assessment & Plan Note (Signed)
Thought orthostatic due to antihypertensives, now off medication.

## 2020-03-31 NOTE — Assessment & Plan Note (Signed)
Describes unsteadiness "swaying feeling" intermittently as well as less frequent episodes of vertigo with sudden head movement, both starting after orthostatic syncope with facial trauma.  Discussed positional vertigo vs possible concussion after fall as possible causes of ongoing unsteadiness. She does not endorse vision change or headache post-syncope. She did have recent reassuring neurological evaluation (Dr Frances Furbish) and has planned brain MRA pending.  She does have positive dix hallpike on left today - suggesting component of BPPV, and did benefit from vestibular rehab during latest hospitalization. Will treat with modified epley maneuvers at home (handout provided) as well as refer to ENT for possible further vestibular rehab. Pt agrees with plan.

## 2020-03-31 NOTE — Patient Instructions (Signed)
Testing was positive for positional vertigo on left. I would like to refer you to ENT for further evaluation and to discuss possible vestibular rehab.   Benign Positional Vertigo Vertigo is the feeling that you or your surroundings are moving when they are not. Benign positional vertigo is the most common form of vertigo. This is usually a harmless condition (benign). This condition is positional. This means that symptoms are triggered by certain movements and positions. This condition can be dangerous if it occurs while you are doing something that could cause harm to you or others. This includes activities such as driving or operating machinery. What are the causes? In many cases, the cause of this condition is not known. It may be caused by a disturbance in an area of the inner ear that helps your brain to sense movement and balance. This disturbance can be caused by:  Viral infection (labyrinthitis).  Head injury.  Repetitive motion, such as jumping, dancing, or running. What increases the risk? You are more likely to develop this condition if:  You are a woman.  You are 53 years of age or older. What are the signs or symptoms? Symptoms of this condition usually happen when you move your head or your eyes in different directions. Symptoms may start suddenly, and usually last for less than a minute. They include:  Loss of balance and falling.  Feeling like you are spinning or moving.  Feeling like your surroundings are spinning or moving.  Nausea and vomiting.  Blurred vision.  Dizziness.  Involuntary eye movement (nystagmus). Symptoms can be mild and cause only minor problems, or they can be severe and interfere with daily life. Episodes of benign positional vertigo may return (recur) over time. Symptoms may improve over time. How is this diagnosed? This condition may be diagnosed based on:  Your medical history.  Physical exam of the head, neck, and ears.  Tests, such  as: ? MRI. ? CT scan. ? Eye movement tests. Your health care provider may ask you to change positions quickly while he or she watches you for symptoms of benign positional vertigo, such as nystagmus. Eye movement may be tested with a variety of exams that are designed to evaluate or stimulate vertigo. ? An electroencephalogram (EEG). This records electrical activity in your brain. ? Hearing tests. You may be referred to a health care provider who specializes in ear, nose, and throat (ENT) problems (otolaryngologist) or a provider who specializes in disorders of the nervous system (neurologist). How is this treated?  This condition may be treated in a session in which your health care provider moves your head in specific positions to adjust your inner ear back to normal. Treatment for this condition may take several sessions. Surgery may be needed in severe cases, but this is rare. In some cases, benign positional vertigo may resolve on its own in 2-4 weeks. Follow these instructions at home: Safety  Move slowly. Avoid sudden body or head movements or certain positions, as told by your health care provider.  Avoid driving until your health care provider says it is safe for you to do so.  Avoid operating heavy machinery until your health care provider says it is safe for you to do so.  Avoid doing any tasks that would be dangerous to you or others if vertigo occurs.  If you have trouble walking or keeping your balance, try using a cane for stability. If you feel dizzy or unstable, sit down right away.  Return  to your normal activities as told by your health care provider. Ask your health care provider what activities are safe for you. General instructions  Take over-the-counter and prescription medicines only as told by your health care provider.  Drink enough fluid to keep your urine pale yellow.  Keep all follow-up visits as told by your health care provider. This is  important. Contact a health care provider if:  You have a fever.  Your condition gets worse or you develop new symptoms.  Your family or friends notice any behavioral changes.  You have nausea or vomiting that gets worse.  You have numbness or a "pins and needles" sensation. Get help right away if you:  Have difficulty speaking or moving.  Are always dizzy.  Faint.  Develop severe headaches.  Have weakness in your legs or arms.  Have changes in your hearing or vision.  Develop a stiff neck.  Develop sensitivity to light. Summary  Vertigo is the feeling that you or your surroundings are moving when they are not. Benign positional vertigo is the most common form of vertigo.  The cause of this condition is not known. It may be caused by a disturbance in an area of the inner ear that helps your brain to sense movement and balance.  Symptoms include loss of balance and falling, feeling that you or your surroundings are moving, nausea and vomiting, and blurred vision.  This condition can be diagnosed based on symptoms, physical exam, and other tests, such as MRI, CT scan, eye movement tests, and hearing tests.  Follow safety instructions as told by your health care provider. You will also be told when to contact your health care provider in case of problems. This information is not intended to replace advice given to you by your health care provider. Make sure you discuss any questions you have with your health care provider. Document Revised: 04/01/2018 Document Reviewed: 04/01/2018 Elsevier Patient Education  [place].

## 2020-03-31 NOTE — Assessment & Plan Note (Signed)
Now sees lipid clinic, looking into Repatha.  Currently tolerating lovastatin better than other statins previously tried.

## 2020-03-31 NOTE — Assessment & Plan Note (Signed)
Currently off treatment.

## 2020-04-04 ENCOUNTER — Telehealth: Payer: Self-pay | Admitting: Cardiovascular Disease

## 2020-04-04 ENCOUNTER — Telehealth: Payer: Self-pay | Admitting: Neurology

## 2020-04-04 NOTE — Telephone Encounter (Signed)
RETURNED A CALL TO THE pt regarding the repatha they received a phone call from repatha and wasn't sure who called them. I stated that it wasn't from me.

## 2020-04-04 NOTE — Telephone Encounter (Signed)
Noted  

## 2020-04-04 NOTE — Telephone Encounter (Signed)
Just an FYI  I spoke to the patient she states she is not going to do the MRA now because she went back to her PCP and he told her it was positional vertigo and that he sent a referral to a ENT.  [employer] Auth: [id number] (exp. 58/28/21 to 09/26/20)

## 2020-04-04 NOTE — Telephone Encounter (Signed)
Noted, thank you

## 2020-04-04 NOTE — Telephone Encounter (Signed)
Please call patient regarding the brain MRA.  It may be worth going through the MRA to make sure it looks normal.  She has been diagnosed with positional vertigo and has been referred by ENT by her PCP but if she is agreeable, we can still pursue the MRA, especially if insurance has approved it.

## 2020-04-04 NOTE — Telephone Encounter (Signed)
Felicia [name] is calling requesting to speak with Felicia [name] in the lipid clinic in regards to the prior auth for a medication. Please advise.

## 2020-04-05 DIAGNOSIS — H8112 Benign paroxysmal vertigo, left ear: Secondary | ICD-10-CM | POA: Diagnosis not present

## 2020-04-05 DIAGNOSIS — R42 Dizziness and giddiness: Secondary | ICD-10-CM | POA: Diagnosis not present

## 2020-04-05 DIAGNOSIS — H6123 Impacted cerumen, bilateral: Secondary | ICD-10-CM | POA: Diagnosis not present

## 2020-04-05 NOTE — Telephone Encounter (Signed)
I reached out to the pt and advised of Dr. Teofilo Pod recommendation. Pt is still hesitant to complete the MRA... but sts she will talk with the ENT this am and see if he thinks MRA would be beneficial and then call back and let us know if she would like to purse the scan.

## 2020-04-05 NOTE — Telephone Encounter (Signed)
Noted, thank you

## 2020-04-06 ENCOUNTER — Telehealth: Payer: Self-pay

## 2020-04-06 DIAGNOSIS — E1069 Type 1 diabetes mellitus with other specified complication: Secondary | ICD-10-CM

## 2020-04-06 DIAGNOSIS — E785 Hyperlipidemia, unspecified: Secondary | ICD-10-CM

## 2020-04-06 MED ORDER — REPATHA SURECLICK 140 MG/ML ~~LOC~~ SOAJ: 140.0000 mg | SUBCUTANEOUS | 11 refills | Status: DC

## 2020-04-06 NOTE — Telephone Encounter (Signed)
Called and spoke w/pt regarding the approval of the repatha, rx sent, copay card activated, pt instructed to come in for labs when ready to take 3rd shot, pt voiced understanding

## 2020-04-09 ENCOUNTER — Encounter: Payer: Self-pay | Admitting: Internal Medicine

## 2020-04-10 ENCOUNTER — Encounter: Payer: BC Managed Care – PPO | Attending: Cardiovascular Disease | Admitting: *Deleted

## 2020-04-10 ENCOUNTER — Other Ambulatory Visit: Payer: Self-pay

## 2020-04-10 DIAGNOSIS — Z87442 Personal history of urinary calculi: Secondary | ICD-10-CM | POA: Insufficient documentation

## 2020-04-10 DIAGNOSIS — E10319 Type 1 diabetes mellitus with unspecified diabetic retinopathy without macular edema: Secondary | ICD-10-CM | POA: Insufficient documentation

## 2020-04-10 DIAGNOSIS — Z7982 Long term (current) use of aspirin: Secondary | ICD-10-CM | POA: Insufficient documentation

## 2020-04-10 DIAGNOSIS — I2510 Atherosclerotic heart disease of native coronary artery without angina pectoris: Secondary | ICD-10-CM | POA: Insufficient documentation

## 2020-04-10 DIAGNOSIS — Z79899 Other long term (current) drug therapy: Secondary | ICD-10-CM | POA: Insufficient documentation

## 2020-04-10 DIAGNOSIS — Z951 Presence of aortocoronary bypass graft: Secondary | ICD-10-CM | POA: Insufficient documentation

## 2020-04-10 DIAGNOSIS — K219 Gastro-esophageal reflux disease without esophagitis: Secondary | ICD-10-CM | POA: Insufficient documentation

## 2020-04-10 DIAGNOSIS — E785 Hyperlipidemia, unspecified: Secondary | ICD-10-CM | POA: Insufficient documentation

## 2020-04-10 DIAGNOSIS — I252 Old myocardial infarction: Secondary | ICD-10-CM | POA: Insufficient documentation

## 2020-04-10 DIAGNOSIS — Z87440 Personal history of urinary (tract) infections: Secondary | ICD-10-CM | POA: Insufficient documentation

## 2020-04-10 DIAGNOSIS — Z794 Long term (current) use of insulin: Secondary | ICD-10-CM | POA: Insufficient documentation

## 2020-04-10 NOTE — Progress Notes (Signed)
Completed virtual orientation today.  EP evaluation is scheduled for Mon 6/14 at 930.  Documentation for diagnosis can be found in [facility] encounter 4/2, 4/19, 4/25.

## 2020-04-12 DIAGNOSIS — H8112 Benign paroxysmal vertigo, left ear: Secondary | ICD-10-CM | POA: Diagnosis not present

## 2020-04-13 ENCOUNTER — Encounter: Payer: Self-pay | Admitting: Internal Medicine

## 2020-04-13 ENCOUNTER — Other Ambulatory Visit: Payer: Self-pay

## 2020-04-13 ENCOUNTER — Ambulatory Visit (INDEPENDENT_AMBULATORY_CARE_PROVIDER_SITE_OTHER): Payer: BC Managed Care – PPO | Admitting: Internal Medicine

## 2020-04-13 VITALS — BP 148/70 | HR 94 | Ht 64.0 in | Wt 133.0 lb

## 2020-04-13 DIAGNOSIS — S52532D Colles' fracture of left radius, subsequent encounter for closed fracture with routine healing: Secondary | ICD-10-CM | POA: Diagnosis not present

## 2020-04-13 DIAGNOSIS — E1065 Type 1 diabetes mellitus with hyperglycemia: Secondary | ICD-10-CM | POA: Diagnosis not present

## 2020-04-13 DIAGNOSIS — E785 Hyperlipidemia, unspecified: Secondary | ICD-10-CM

## 2020-04-13 DIAGNOSIS — E1059 Type 1 diabetes mellitus with other circulatory complications: Secondary | ICD-10-CM

## 2020-04-13 DIAGNOSIS — IMO0002 Reserved for concepts with insufficient information to code with codable children: Secondary | ICD-10-CM

## 2020-04-13 DIAGNOSIS — E103293 Type 1 diabetes mellitus with mild nonproliferative diabetic retinopathy without macular edema, bilateral: Secondary | ICD-10-CM | POA: Diagnosis not present

## 2020-04-13 DIAGNOSIS — E1069 Type 1 diabetes mellitus with other specified complication: Secondary | ICD-10-CM | POA: Diagnosis not present

## 2020-04-13 NOTE — Progress Notes (Signed)
Patient ID: Felicia [name], female   DOB: 1960/12/03, 59 y.o.   MRN: [id number]  This visit occurred during the SARS-CoV-2 public health emergency.  Safety protocols were in place, including screening questions prior to the visit, additional usage of staff PPE, and extensive cleaning of exam room while observing appropriate contact time as indicated for disinfecting solutions.   HPI: Felicia [name] is a 59 y.o.-year-old female, presenting for follow-up for DM1, dx in 25 (age 47), uncontrolled, with complications (CAD - s/p 5v CABG, mild retinopathy, gastroparesis). Last visit 3 months ago (virtual).  In 01/2020 >> started exercise >> started to have CP >> saw PCP - BP very high >> sent to ED >> AMI r/o >> sent to see cardiology in [facility] >> had cath >> 9 blockages >> CABG 02/21/2020 >> went home >> she had a syncopal episode in 02/25/2020 (fell and fractured orbit, wrist) -2/2 hypotension >> another catheterization >> 4/5 bypasses failed.  As of now, she is followed conservatively with plan for PCI if chest pain recurs. She will start cardiac rehab soon.  Her sugars improved significantly since her CABG surgery.  Reviewed HbA1c levels: Lab Results  Component Value Date   HGBA1C 8.9 (H) 02/17/2020   HGBA1C 8.5 (H) 09/23/2019   HGBA1C 9.0 (H) 06/17/2019   She was previously on an insulin pump for 5 years (approximately 2010) but she did not like it.  Sugars were not improved on this.  She is not interested in getting back on the pump but at last visit she was open to a CGM.  I referred her to diabetes education but she did not have the appointment yet.  She did check with her insurance >> Dexcom was not affordable in the past, now got it but did not attach it yet.  She is on: - Tresiba 12 units in am >> 6-8 units in am >> 10 units in a.m. >> 10 in am and 2 at night >> 6 units 2x a day -abdomen >> now back on Lantus 8 units in am and 4 units at bedtime >> Lantus 8 >>  Now 10 units in am and 4 >> 6  units at bedtime (9 pm) - R insulin: - insulin to carb ratio (ICR)   B'fast: 10:1  Lunch: 10:1   Dinner: 20:1 (no more than 2 to 3 units) - target 150 - insulin sensitivity factor (ISF) 60: 150-200: + 1 unit 201-260: + 2 units 261-320: + 3 units 321-380: + 4 units >380: + 5 units Do not correct bedtime sugars <300, and only inject 1 unit then.  For the snack at night, do not take more than 1-2 units of regular insulin!  We tried Metformin ER 1000 mg with dinner but stopped due to lack of effect. Tried [place] >> she did not feel that this worked as well as Lantus. Tried Toujeo >> allergy: CP, SOB.  Meter: ReliOn  She currently has a CGM but did not attach it yet. She checks sugars 4x a day >> MUCH improved: - am: 175-343 >> 51, 59-157, 191, 229 - 2h after b'fast: n/c >> 87-203 - lunch: 146, 236-342 >> 81-192, 264 - 2h after lunch: n/c >> 62 - dinner: 196-303, 436 >> 71-174, 260, 265 - 2h after dinner: n/c >> 83 - bedtime: 83-203 >> 65-208, 253, 266 - night: 55, 150 >> n/c  Lowest sugar was 38 >>... 44 >> 55 >> 51; she has hypoglycemia awareness in the 70s.  No  recent hypo or hyperglycemia admissions.  She has an nonexpired glucagon kit at home. Highest sugar was 500s (bagel) >> 436 >> 266.   Pt's meals are: - Breakfast: protein bar + fruit >> granola bar (25 g carbs) - 10 am snack  - Lunch: apple + cheese, carrots - Snack bar: 15g carbs - Glucerna - Dinner: meat + 1/2 backed potato + veggies/salad - Snacks: popcorn, salty snacks: Potato chips  -No CKD, last BUN/creatinine:  Lab Results  Component Value Date   BUN 11 03/01/2020   CREATININE 0.97 03/01/2020  On quinapril + HL; last set of lipids: Lab Results  Component Value Date   CHOL 196 09/23/2019   HDL 58.10 09/23/2019   LDLCALC 106 (H) 09/23/2019   LDLDIRECT 120.0 06/17/2019   TRIG 160.0 (H) 09/23/2019   CHOLHDL 3 09/23/2019  In 04/2016, we started pravastatin, but she had to come off due to muscle  cramps.  CoQ10 did not help much.  She restarted pravastatin by PCP >> now Lovastatin and Zetia, is will start Repatha. She also uses uses 2 creams: Theraworx + Muscle calm.   - last eye exam was in 11/18/2019: + DR - no numbness and tingling in her feet.  TSH was normal at last check: Lab Results  Component Value Date   TSH 2.744 02/20/2020   She also has a history of HTN.  ROS: Constitutional: no weight gain/no weight loss, no fatigue, no subjective hyperthermia, no subjective hypothermia Eyes: no blurry vision, no xerophthalmia ENT: no sore throat, no nodules palpated in neck, no dysphagia, no odynophagia, no hoarseness Cardiovascular: no CP/no SOB/no palpitations/no leg swelling Respiratory: no cough/no SOB/no wheezing Gastrointestinal: no N/no V/no D/no C/no acid reflux Musculoskeletal: + muscle aches/no joint aches Skin: no rashes, no hair loss Neurological: no tremors/no numbness/no tingling/no dizziness, but + positional vertigo  - improving - in vestibular rehab   I reviewed pt's medications, allergies, PMH, social hx, family hx, and changes were documented in the history of present illness. Otherwise, unchanged from my initial visit note.  Past Medical History:  Diagnosis Date  . Allergy   . Anemia    past history long ago  . Borderline HTN (hypertension)   . CAD (coronary artery disease)    a. 02/2020 Cath: LM nl, LAD large, 90p, 18m D1 90p, LCX nl, OM 60-70p, RCA dominant w/ dampening upon engagement of ostium. 70p/m, RPDA 50. EF 55-65%.  . Disorder of kidney    has abd kidney (right)  . GERD (gastroesophageal reflux disease)   . History of diabetic gastroparesis   . History of echocardiogram    a. 01/2013 Echo: EF 55-65%, no rwma. Mild MR. Nl RV fxn.  .Marland KitchenHLD (hyperlipidemia)   . Hx: UTI (urinary tract infection)   . IBS (irritable bowel syndrome)   . Nephrolithiasis 2005  . Osteopenia 2012   per pt  . Systolic murmur 35/6433  mild mitral insuff  . Type 1  diabetes mellitus with diabetic retinopathy without macular edema (HCC)    dx at 59y/o, background retinopathy   Past Surgical History:  Procedure Laterality Date  . CESAREAN SECTION  1985  . COLONOSCOPY  07/2016   WNL ([place]le)  . CORONARY ARTERY BYPASS GRAFT N/A 02/21/2020   Procedure: CORONARY ARTERY BYPASS GRAFTING (CABG) times five using left internal mammary and right leg saphenous vein;  Surgeon: LLajuana Matte MD;  Location: [facility]  Service: Open Heart Surgery;  Laterality: N/A;  . ESOPHAGOGASTRODUODENOSCOPY  07/2016   LA Grade C reflux esophagitis, concern for stasis/dysmotility ([place])  . LAPAROSCOPIC APPENDECTOMY N/A 09/01/2015   Procedure: APPENDECTOMY LAPAROSCOPIC;  Surgeon: Judeth Horn, MD;  Location: [facility];  Service: General;  Laterality: N/A;  . LEFT HEART CATH AND CORONARY ANGIOGRAPHY N/A 02/17/2020   Procedure: LEFT HEART CATH AND CORONARY ANGIOGRAPHY;  Surgeon: Minna Merritts, MD;  Location: [facility];  Service: Cardiovascular;  Laterality: N/A;  . LEFT HEART CATH AND CORS/GRAFTS ANGIOGRAPHY N/A 02/29/2020   Procedure: LEFT HEART CATH AND CORS/GRAFTS ANGIOGRAPHY;  Surgeon: Burnell Blanks, MD;  Location: [facility];  Service: Cardiovascular;  Laterality: N/A;  . ORIF FEMUR FRACTURE Left 1996   Corrected by patient in history 2016  . PARTIAL HYSTERECTOMY  2000   paps by [facility] [place]  . TEE WITHOUT CARDIOVERSION N/A 02/21/2020   Procedure: TRANSESOPHAGEAL ECHOCARDIOGRAM (TEE);  Surgeon: Lajuana Matte, MD;  Location: [facility];  Service: Open Heart Surgery;  Laterality: N/A;  . TONSILLECTOMY AND ADENOIDECTOMY  1975  . TRIGGER FINGER RELEASE Left 12/2010   thumb   Social History   Socioeconomic History  . Marital status: Married    Spouse name: Not on file  . Number of children: 1  . Years of education: 31  . Highest education level: Bachelor's degree (e.g., BA, AB, BS)  Occupational History  . 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 Smoking status: Never Smoker  . Smokeless tobacco: Never Used  Vaping Use  . Vaping Use: Never used  Substance and Sexual Activity  . Alcohol use: No    Alcohol/week: 0.0 standard drinks  . Drug use: No  . Sexual activity: Not on file  Other Topics Concern  . 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  Has one daughter.  Education: college.      1 dog   Social Determinants of Radio broadcast assistant Strain:   . Difficulty of Paying Living Expenses:   Food Insecurity:   . Worried About Charity fundraiser in the Last Year:   . Arboriculturist in the Last Year:   Transportation Needs:   . Film/video editor (Medical):   Marland Kitchen Lack of Transportation (Non-Medical):   Physical Activity:   . Days of Exercise per Week:   . Minutes of Exercise per Session:   Stress:   . Feeling of Stress :   Social Connections:   . Frequency of Communication with Friends and Family:   . Frequency of Social Gatherings with Friends and Family:   . Attends Religious Services:   . Active Member of Clubs or Organizations:   . Attends Archivist Meetings:   Marland Kitchen Marital Status:   Intimate Partner Violence:   . Fear of Current or Ex-Partner:   . Emotionally Abused:   Marland Kitchen Physically Abused:   . Sexually Abused:    Current Outpatient Medications on File Prior to Visit  Medication Sig Dispense Refill  . aspirin EC 325 MG EC tablet Take 1 tablet (325 mg total) by mouth daily. 30 tablet 0  . esomeprazole (NEXIUM) 40 MG capsule Take 1 capsule (40 mg total) by mouth daily. Patient needs office visit for further refills 90 capsule 0  . Evolocumab (REPATHA SURECLICK) 299 MG/ML  SOAJ Inject 140 mg into the skin every 14 (fourteen) days. 2 pen 11  . ezetimibe (ZETIA) 10 MG tablet Take 1 tablet (10 mg total) by mouth daily. 30 tablet 2  .  glucagon (GLUCAGON EMERGENCY) 1 MG injection Inject 1 mg into the muscle once as needed (in case of severe hypoglycemia). 1 each 12  . Insulin Glargine (LANTUS SOLOSTAR) 100 UNIT/ML Solostar Pen Inject 12 Units into the skin daily. (Patient taking differently: Inject 8-10 Units into the skin See admin instructions. Inject 10 units into the skin in the morning and 8 units in the evening) 5 pen 11  . Insulin Pen Needle (NOVOFINE) 32G X 6 MM MISC USE TO INJECT INSULIN 4 TIMES A DAY 200 each 1  . insulin regular (NOVOLIN R) 100 units/mL injection INJECT 6 UNITS TOTAL INTO THE SKIN 3 (THREE) TIMES DAILY BEFORE MEALS. (Patient taking differently: Inject 1-10 Units into the skin See admin instructions. For glucose 150-210 add 1 additional unit, per scale 211-270= 2 units, 271-330= 3 units, 331-390= 4 units  For breakfast and dinner, administer 1 unit for every 10 grams of carbs. For snacks and dinner, administer 1 unit for every 20 grams of carbs.) 60 mL 1  . Insulin Syringe-Needle U-100 30G X 5/16" 0.3 ML MISC Use to inject insulin 6 times a day. 600 each 11  . lovastatin (MEVACOR) 20 MG tablet Take 1 tablet (20 mg total) by mouth at bedtime. (Patient not taking: Reported on 04/10/2020) 30 tablet 6  . Multiple Vitamin (MULTIVITAMIN) tablet Take 1 tablet by mouth daily.       No current facility-administered medications on file prior to visit.   Allergies  Allergen Reactions  . Protonix [Pantoprazole] Swelling    Facial Swelling  . Toujeo Solostar [Insulin Glargine] Shortness Of Breath  . Lactose Intolerance (Gi)     GI  . Magnesium-Containing Compounds Swelling and Other (See Comments)    limbs  . Adhesive [Tape] Rash and Other (See Comments)    Band aids   Family History  Problem Relation Age of Onset  . Diabetes Maternal Grandmother   . Heart failure Maternal Grandmother   . COPD Maternal Grandmother        smoker  . Thyroid disease Maternal Grandmother   . Vascular Disease Maternal  Grandmother   . Colon cancer Maternal Grandfather   . Colon polyps Mother   . Breast cancer Other   . CAD Brother 32       stent  . Esophageal cancer Neg Hx   . Rectal cancer Neg Hx   . Stomach cancer Neg Hx    PE: BP (!) 148/70   Pulse 94   Ht 5' 4" (1.626 m)   Wt 133 lb (60.3 kg)   SpO2 97%   BMI 22.83 kg/m  Body mass index is 22.83 kg/m.  Wt Readings from Last 3 Encounters:  04/13/20 133 lb (60.3 kg)  03/31/20 135 lb 8 oz (61.5 kg)  03/23/20 138 lb (62.6 kg)   Constitutional: normal weight, in NAD Eyes: PERRLA, EOMI, no exophthalmos ENT: moist mucous membranes, no thyromegaly, no cervical lymphadenopathy Cardiovascular: tachycardia, RR, No MRG Respiratory: CTA B Gastrointestinal: abdomen soft, NT, ND, BS+ Musculoskeletal: no deformities, strength intact in all 4 Skin: moist, warm, no rashes Neurological: no tremor with outstretched hands, DTR normal in all 4   ASSESSMENT: 1. DM1, uncontrolled, with complications - CAD - s/p 5v CABG - 02/21/2020 - Mild DR - Gastroparesis - This is  the reason why she is on regular insulin, and she is taking it 10 minutes  rather than 30 minutes before eating  She had low CBGs with Novolog!  2. HL  PLAN:  1.  Complex patient, with difficult to control, longstanding, type 1 diabetes, on basal-bolus insulin regimen. She tried an insulin pump in the past but did not like it.  Dexcom CGM was too expensive in the past but she recently obtained it.  She did not do that yet but will do so soon. -At last visit, sugars were extremely fluctuating and we did adjust her Lantus dose.  I also advised her to not take more than 2 to 3 units of insulin with dinner to avoid low blood sugars during the night.  Also, I advised him to stop snacking at night, if possible.  She was also having more proteins with dinner and I advised her to try a plant-based enough to see if this helps improve the morning sugars.  We discussed again at last visit about the  fact that we reached the limit of what we can do with the basal/bolus insulin regimen for her, the only other option  would be an insulin pump, but she  was reticent to try this.  At last visit, we moved to Lantus later, from 6-7 pm to ~9 pm. -Since last visit, she had anginal pain and was found to have 9 coronary blockages.  She had to have a 5v CABG, but had a syncopal episode afterwards due to low blood pressure and had a catheterization that showed that 4 venous grafts were obstructed.  As of now, she is followed conservatively and may need PCI if she develops chest pain again.  She will start cardiac rehab. -At this visit, sugars improved significantly after her CABG and they are even now in the 21s and 53s.  She has occasional higher blood sugars in the 200s but overall the sugars are much improved.  She still has lower blood sugars after meals but she is only taking approximately 2 units of Humalog with every meal so I do not feel that she would benefit from decreasing the rapid acting insulin.  And we discussed about decreasing her am Lantus dose from 10 to 8 units in the days that she does not have cardiac rehab and possibly even lower, to 6 units in the days that she has cardiac rehab.  She does have occasional low blood sugars in the morning but she is taking only 6 units of Lantus at night.  We may need to also decrease this if sugars persistently low in the morning. -For now, we will wait for her to attach the CGM and this will show [place] further trends in her blood sugars. - I suggested to:  Patient Instructions  Please change: - Lantus 10 >> 8 (may need 6 units in rehab days) units in am and 6 units at bedtime  - R insulin: - insulin to carb ratio (ICR)   B'fast: 10:1  Lunch: 10:1   Dinner: 20:1 (no more than 2-3 units) - target 150 - insulin sensitivity factor (ISF) 60: 150-200: + 1 unit 201-260: + 2 units 261-320: + 3 units 321-380: + 4 units >380: + 5 units Do not correct  bedtime sugars <300, and only inject 1 unit then.   For the snack at night, do not take more than 1-2 units of regular insulin!  Please return in 3 months with your sugar log.   -  advised to check sugars at different times of the day - 4x a day, rotating check times - advised for yearly eye exams >> she is UTD - return to clinic in 3-4 months     2. HL -Reviewed latest lipid panel from 09/2019: LDL improved, but still above goal of <70, triglycerides slightly high.  : Lab Results  Component Value Date   CHOL 196 09/23/2019   HDL 58.10 09/23/2019   LDLCALC 106 (H) 09/23/2019   LDLDIRECT 120.0 06/17/2019   TRIG 160.0 (H) 09/23/2019   CHOLHDL 3 09/23/2019  -On Lovastatin 40 + Zetia 10.  She will start [place] soon, after stopping Lovastatin.  - Total time spent for the visit: 50 minutes, in obtaining medical information from the patient and also from the chart, reviewing her  previous labs, hospitalization records, and treatments, reviewing her symptoms, counseling her about her diabetes (please see the discussed topics above), and developing a plan to further avoid hypo- and hyperglycemia.  Philemon Kingdom, MD PhD [facility] Endocrinology

## 2020-04-13 NOTE — Patient Instructions (Signed)
Please change: - Lantus 10 >> 8 (may need 6 units in rehab days) units in am and 6 units at bedtime  - R insulin: - insulin to carb ratio (ICR)   B'fast: 10:1  Lunch: 10:1   Dinner: 20:1 (no more than 2-3 units) - target 150 - insulin sensitivity factor (ISF) 60: 150-200: + 1 unit 201-260: + 2 units 261-320: + 3 units 321-380: + 4 units >380: + 5 units Do not correct bedtime sugars <300, and only inject 1 unit then.   For the snack at night, do not take more than 1-2 units of regular insulin!  Please return in 3 months with your sugar log.

## 2020-04-17 ENCOUNTER — Encounter: Payer: BC Managed Care – PPO | Admitting: *Deleted

## 2020-04-17 ENCOUNTER — Other Ambulatory Visit: Payer: Self-pay

## 2020-04-17 VITALS — Ht 63.75 in | Wt 132.7 lb

## 2020-04-17 DIAGNOSIS — Z87440 Personal history of urinary (tract) infections: Secondary | ICD-10-CM | POA: Diagnosis not present

## 2020-04-17 DIAGNOSIS — Z794 Long term (current) use of insulin: Secondary | ICD-10-CM | POA: Diagnosis not present

## 2020-04-17 DIAGNOSIS — Z79899 Other long term (current) drug therapy: Secondary | ICD-10-CM | POA: Diagnosis not present

## 2020-04-17 DIAGNOSIS — I214 Non-ST elevation (NSTEMI) myocardial infarction: Secondary | ICD-10-CM

## 2020-04-17 DIAGNOSIS — K219 Gastro-esophageal reflux disease without esophagitis: Secondary | ICD-10-CM | POA: Diagnosis not present

## 2020-04-17 DIAGNOSIS — Z87442 Personal history of urinary calculi: Secondary | ICD-10-CM | POA: Diagnosis not present

## 2020-04-17 DIAGNOSIS — Z951 Presence of aortocoronary bypass graft: Secondary | ICD-10-CM | POA: Diagnosis not present

## 2020-04-17 DIAGNOSIS — E10319 Type 1 diabetes mellitus with unspecified diabetic retinopathy without macular edema: Secondary | ICD-10-CM | POA: Diagnosis not present

## 2020-04-17 DIAGNOSIS — I252 Old myocardial infarction: Secondary | ICD-10-CM | POA: Diagnosis not present

## 2020-04-17 DIAGNOSIS — I2510 Atherosclerotic heart disease of native coronary artery without angina pectoris: Secondary | ICD-10-CM | POA: Diagnosis not present

## 2020-04-17 DIAGNOSIS — E785 Hyperlipidemia, unspecified: Secondary | ICD-10-CM | POA: Diagnosis not present

## 2020-04-17 DIAGNOSIS — Z7982 Long term (current) use of aspirin: Secondary | ICD-10-CM | POA: Diagnosis not present

## 2020-04-17 NOTE — Patient Instructions (Signed)
Patient Instructions  Patient Details  Name: Felicia [name] MRN: [id number] Date of Birth: [birth year] Referring Provider:  Sanda Klein, MD  Below are your personal goals for exercise, nutrition, and risk factors. Our goal is to help you stay on track towards obtaining and maintaining these goals. We will be discussing your progress on these goals with you throughout the program.  Initial Exercise Prescription:  Initial Exercise Prescription - 04/17/20 1000      Date of Initial Exercise RX and Referring Provider   Date 04/17/20    Referring Provider Melodie Bouillon MD    Primary Cardiologist: Dr. Arelia Sneddon Croitoru     Treadmill   MPH 1.7    Grade 0    Minutes 15    METs 2.3      REL-XR   Level 2    Speed 50    Minutes 15    METs 2.5      T5 Nustep   Level 3    SPM 80    Minutes 15    METs 2.5      Prescription Details   Frequency (times per week) 2    Duration Progress to 30 minutes of continuous aerobic without signs/symptoms of physical distress      Intensity   THRR 40-80% of Max Heartrate 120-149    Ratings of Perceived Exertion 11-13    Perceived Dyspnea 0-4      Progression   Progression Continue to progress workloads to maintain intensity without signs/symptoms of physical distress.      Resistance Training   Training Prescription Yes    Weight 3lbs (right), 1 lb (left)    Reps 10-15           Exercise Goals: Frequency: Be able to perform aerobic exercise two to three times per week in program working toward 2-5 days per week of home exercise.  Intensity: Work with a perceived exertion of 11 (fairly light) - 15 (hard) while following your exercise prescription.  We will make changes to your prescription with you as you progress through the program.   Duration: Be able to do 30 to 45 minutes of continuous aerobic exercise in addition to a 5 minute warm-up and a 5 minute cool-down routine.   Nutrition Goals: Your personal nutrition goals will be  established when you do your nutrition analysis with the dietician.  The following are general nutrition guidelines to follow: Cholesterol < 200mg /day Sodium < 1500mg /day Fiber: Women over 50 yrs - 21 grams per day  Personal Goals:  Personal Goals and Risk Factors at Admission - 04/17/20 1037      Core Components/Risk Factors/Patient Goals on Admission    Weight Management Yes;Weight Maintenance    Intervention Weight Management: Develop a combined nutrition and exercise program designed to reach desired caloric intake, while maintaining appropriate intake of nutrient and fiber, sodium and fats, and appropriate energy expenditure required for the weight goal.;Weight Management: Provide education and appropriate resources to help participant work on and attain dietary goals.    Admit Weight 132 lb 11.2 oz (60.2 kg)    Goal Weight: Short Term 130 lb (59 kg)    Goal Weight: Long Term 130 lb (59 kg)    Expected Outcomes Short Term: Continue to assess and modify interventions until short term weight is achieved;Long Term: Adherence to nutrition and physical activity/exercise program aimed toward attainment of established weight goal;Weight Maintenance: Understanding of the daily nutrition guidelines, which includes 25-35% calories from fat, 7% or  less cal from saturated fats, less than 200mg  cholesterol, less than 1.5gm of sodium, & 5 or more servings of fruits and vegetables daily    Diabetes Yes    Intervention Provide education about signs/symptoms and action to take for hypo/hyperglycemia.;Provide education about proper nutrition, including hydration, and aerobic/resistive exercise prescription along with prescribed medications to achieve blood glucose in normal ranges: Fasting glucose 65-99 mg/dL    Expected Outcomes Short Term: Participant verbalizes understanding of the signs/symptoms and immediate care of hyper/hypoglycemia, proper foot care and importance of medication, aerobic/resistive  exercise and nutrition plan for blood glucose control.;Long Term: Attainment of HbA1C < 7%.    Hypertension Yes    Intervention Provide education on lifestyle modifcations including regular physical activity/exercise, weight management, moderate sodium restriction and increased consumption of fresh fruit, vegetables, and low fat dairy, alcohol moderation, and smoking cessation.;Monitor prescription use compliance.    Expected Outcomes Short Term: Continued assessment and intervention until BP is < 140/95mm HG in hypertensive participants. < 130/19mm HG in hypertensive participants with diabetes, heart failure or chronic kidney disease.;Long Term: Maintenance of blood pressure at goal levels.    Lipids Yes    Intervention Provide education and support for participant on nutrition & aerobic/resistive exercise along with prescribed medications to achieve LDL 70mg , HDL >40mg .    Expected Outcomes Short Term: Participant states understanding of desired cholesterol values and is compliant with medications prescribed. Participant is following exercise prescription and nutrition guidelines.;Long Term: Cholesterol controlled with medications as prescribed, with individualized exercise RX and with personalized nutrition plan. Value goals: LDL < 70mg , HDL > 40 mg.           Tobacco Use Initial Evaluation: Social History   Tobacco Use  Smoking Status Never Smoker  Smokeless Tobacco Never Used    Exercise Goals and Review:  Exercise Goals    Row Name 04/17/20 1036             Exercise Goals   Increase Physical Activity Yes       Intervention Provide advice, education, support and counseling about physical activity/exercise needs.;Develop an individualized exercise prescription for aerobic and resistive training based on initial evaluation findings, risk stratification, comorbidities and participant's personal goals.       Expected Outcomes Short Term: Attend rehab on a regular basis to increase  amount of physical activity.;Long Term: Add in home exercise to make exercise part of routine and to increase amount of physical activity.;Long Term: Exercising regularly at least 3-5 days a week.       Increase Strength and Stamina Yes       Intervention Provide advice, education, support and counseling about physical activity/exercise needs.;Develop an individualized exercise prescription for aerobic and resistive training based on initial evaluation findings, risk stratification, comorbidities and participant's personal goals.       Expected Outcomes Short Term: Increase workloads from initial exercise prescription for resistance, speed, and METs.;Short Term: Perform resistance training exercises routinely during rehab and add in resistance training at home;Long Term: Improve cardiorespiratory fitness, muscular endurance and strength as measured by increased METs and functional capacity ( )       Able to understand and use rate of perceived exertion (RPE) scale Yes       Intervention Provide education and explanation on how to use RPE scale       Expected Outcomes Long Term:  Able to use RPE to guide intensity level when exercising independently;Short Term: Able to use RPE daily in rehab to express  subjective intensity level       Able to understand and use Dyspnea scale Yes       Intervention Provide education and explanation on how to use Dyspnea scale       Expected Outcomes Long Term: Able to use Dyspnea scale to guide intensity level when exercising independently;Short Term: Able to use Dyspnea scale daily in rehab to express subjective sense of shortness of breath during exertion       Knowledge and understanding of Target Heart Rate Range (THRR) Yes       Intervention Provide education and explanation of THRR including how the numbers were predicted and where they are located for reference       Expected Outcomes Short Term: Able to state/look up THRR;Short Term: Able to use daily as  guideline for intensity in rehab;Long Term: Able to use THRR to govern intensity when exercising independently       Able to check pulse independently Yes       Intervention Provide education and demonstration on how to check pulse in carotid and radial arteries.;Review the importance of being able to check your own pulse for safety during independent exercise       Expected Outcomes Short Term: Able to explain why pulse checking is important during independent exercise;Long Term: Able to check pulse independently and accurately       Understanding of Exercise Prescription Yes       Intervention Provide education, explanation, and written materials on patient's individual exercise prescription       Expected Outcomes Short Term: Able to explain program exercise prescription;Long Term: Able to explain home exercise prescription to exercise independently              Copy of goals given to participant.

## 2020-04-17 NOTE — Progress Notes (Signed)
Cardiac Individual Treatment Plan  Patient Details  Name: Felicia [name] MRN: [id number] Date of Birth: 1961/08/11 Referring Provider:     Cardiac Rehab from 04/17/2020 in [facility] Cardiac and Pulmonary Rehab  Referring Provider Melodie Bouillon MD   [facility] Cardiologist: Dr. Arelia Sneddon Croitoru]      Initial Encounter Date:    Cardiac Rehab from 04/17/2020 in [facility] Cardiac and Pulmonary Rehab  Date 04/17/20      Visit Diagnosis: S/P CABG x 5  NSTEMI (non-ST elevated myocardial infarction) ([place])  Patient's Home Medications on Admission:  Current Outpatient Medications:  .  aspirin EC 325 MG EC tablet, Take 1 tablet (325 mg total) by mouth daily., Disp: 30 tablet, Rfl: 0 .  esomeprazole (NEXIUM) 40 MG capsule, Take 1 capsule (40 mg total) by mouth daily. Patient needs office visit for further refills, Disp: 90 capsule, Rfl: 0 .  Evolocumab (REPATHA SURECLICK) 333 MG/ML SOAJ, Inject 140 mg into the skin every 14 (fourteen) days., Disp: 2 pen, Rfl: 11 .  ezetimibe (ZETIA) 10 MG tablet, Take 1 tablet (10 mg total) by mouth daily., Disp: 30 tablet, Rfl: 2 .  glucagon (GLUCAGON EMERGENCY) 1 MG injection, Inject 1 mg into the muscle once as needed (in case of severe hypoglycemia)., Disp: 1 each, Rfl: 12 .  Insulin Glargine (LANTUS SOLOSTAR) 100 UNIT/ML Solostar Pen, Inject 12 Units into the skin daily. (Patient taking differently: Inject 8-10 Units into the skin See admin instructions. Inject 10 units into the skin in the morning and 8 units in the evening), Disp: 5 pen, Rfl: 11 .  Insulin Pen Needle (NOVOFINE) 32G X 6 MM MISC, USE TO INJECT INSULIN 4 TIMES A DAY, Disp: 200 each, Rfl: 1 .  insulin regular (NOVOLIN R) 100 units/mL injection, INJECT 6 UNITS TOTAL INTO THE SKIN 3 (THREE) TIMES DAILY BEFORE MEALS. (Patient taking differently: Inject 1-10 Units into the skin See admin instructions. For glucose 150-210 add 1 additional unit, per scale 211-270= 2 units, 271-330= 3 units, 331-390= 4 units  For  breakfast and dinner, administer 1 unit for every 10 grams of carbs. For snacks and dinner, administer 1 unit for every 20 grams of carbs.), Disp: 60 mL, Rfl: 1 .  Insulin Syringe-Needle U-100 30G X 5/16" 0.3 ML MISC, Use to inject insulin 6 times a day., Disp: 600 each, Rfl: 11 .  lovastatin (MEVACOR) 20 MG tablet, Take 1 tablet (20 mg total) by mouth at bedtime., Disp: 30 tablet, Rfl: 6 .  Multiple Vitamin (MULTIVITAMIN) tablet, Take 1 tablet by mouth daily.  , Disp: , Rfl:   Past Medical History: Past Medical History:  Diagnosis Date  . Allergy   . Anemia    past history long ago  . Borderline HTN (hypertension)   . CAD (coronary artery disease)    a. 02/2020 Cath: LM nl, LAD large, 90p, 62m D1 90p, LCX nl, OM 60-70p, RCA dominant w/ dampening upon engagement of ostium. 70p/m, RPDA 50. EF 55-65%.  . Disorder of kidney    has abd kidney (right)  . GERD (gastroesophageal reflux disease)   . History of diabetic gastroparesis   . History of echocardiogram    a. 01/2013 Echo: EF 55-65%, no rwma. Mild MR. Nl RV fxn.  .Marland KitchenHLD (hyperlipidemia)   . Hx: UTI (urinary tract infection)   . IBS (irritable bowel syndrome)   . Nephrolithiasis 2005  . Osteopenia 2012   per pt  . Systolic murmur 38/3291  mild mitral insuff  .  Type 1 diabetes mellitus with diabetic retinopathy without macular edema (HCC)    dx at 59 y/o, background retinopathy    Tobacco Use: Social History   Tobacco Use  Smoking Status Never Smoker  Smokeless Tobacco Never Used    Labs: Recent Review Flowsheet Data    Labs for ITP Cardiac and Pulmonary Rehab Latest Ref Rng & Units 02/21/2020 02/21/2020 02/21/2020 02/21/2020 02/21/2020   Cholestrol 0 - 200 mg/dL - - - - -   LDLCALC 0 - 99 mg/dL - - - - -   LDLDIRECT mg/dL - - - - -   HDL >39.00 mg/dL - - - - -   Trlycerides 0 - 149 mg/dL - - - - -   Hemoglobin A1c 4.8 - 5.6 % - - - - -   PHART 7.35 - 7.45 - 7.485(H) 7.344(L) 7.292(L) 7.314(L)   PCO2ART 32 - 48 mmHg -  28.8(L) 38.7 43.3 46.7   HCO3 20.0 - 28.0 mmol/L - 21.7 21.1 20.7 23.3   TCO2 22 - 32 mmol/L _0 ACIDBASEDEF 0.0 - 2.0 mmol/L - 1.0 4.0(H) 5.0(H) 3.0(H)   O2SAT % - 98.0 99.0 88.0 100.0       Exercise Target Goals: Exercise Program Goal: Individual exercise prescription set using results from initial 6 min walk test and THRR while considering  patient's activity barriers and safety.   Exercise Prescription Goal: Initial exercise prescription builds to 30-45 minutes a day of aerobic activity, 2-3 days per week.  Home exercise guidelines will be given to patient during program as part of exercise prescription that the participant will acknowledge.   Education: Aerobic Exercise & Resistance Training: - Gives group verbal and written instruction on the various components of exercise. Focuses on aerobic and resistive training programs and the benefits of this training and how to safely progress through these programs..   Education: Exercise & Equipment Safety: - Individual verbal instruction and demonstration of equipment use and safety with use of the equipment.   Cardiac Rehab from 04/17/2020 in [facility] Cardiac and Pulmonary Rehab  Date 04/17/20  Educator [facility]  Instruction Review Code 1- Verbalizes Understanding      Education: Exercise Physiology & General Exercise Guidelines: - Group verbal and written instruction with models to review the exercise physiology of the cardiovascular system and associated critical values. Provides general exercise guidelines with specific guidelines to those with heart or lung disease.    Education: Flexibility, Balance, Mind/Body Relaxation: Provides group verbal/written instruction on the benefits of flexibility and balance training, including mind/body exercise modes such as yoga, pilates and tai chi.  Demonstration and skill practice provided.   Activity Barriers & Risk Stratification:  Activity Barriers & Cardiac Risk Stratification -  04/17/20 1031      Activity Barriers & Cardiac Risk Stratification   Activity Barriers Deconditioning;Incisional Pain;Muscular Weakness;Joint Problems;Balance Concerns;Other (comment);History of Falls;Back Problems;Neck/Spine Problems    Comments no heavy weights for 3 more weeks    Cardiac Risk Stratification Moderate           6 Minute Walk:  6 Minute Walk    Row Name 04/17/20 1030         6 Minute Walk   Phase Initial     Distance 940 feet     Walk Time 6 minutes     # of Rest Breaks 0     MPH 1.78     METS 3.65     RPE 13  VO2 Peak 12.77     Symptoms No     Resting HR 92 bpm     Resting BP 118/64     Resting Oxygen Saturation  99 %     Exercise Oxygen Saturation  during 6 min walk 99 %     Max Ex. HR 106 bpm     Max Ex. BP 186/82     2 Minute Post BP 154/82            Oxygen Initial Assessment:   Oxygen Re-Evaluation:   Oxygen Discharge (Final Oxygen Re-Evaluation):   Initial Exercise Prescription:  Initial Exercise Prescription - 04/17/20 1000      Date of Initial Exercise RX and Referring Provider   Date 04/17/20    Referring Provider Melodie Bouillon MD    Primary Cardiologist: Dr. Arelia Sneddon Croitoru     Treadmill   MPH 1.7    Grade 0    Minutes 15    METs 2.3      REL-XR   Level 2    Speed 50    Minutes 15    METs 2.5      T5 Nustep   Level 3    SPM 80    Minutes 15    METs 2.5      Prescription Details   Frequency (times per week) 2    Duration Progress to 30 minutes of continuous aerobic without signs/symptoms of physical distress      Intensity   THRR 40-80% of Max Heartrate 120-149    Ratings of Perceived Exertion 11-13    Perceived Dyspnea 0-4      Progression   Progression Continue to progress workloads to maintain intensity without signs/symptoms of physical distress.      Resistance Training   Training Prescription Yes    Weight 3lbs (right), 1 lb (left)    Reps 10-15           Perform Capillary Blood  Glucose checks as needed.  Exercise Prescription Changes:  Exercise Prescription Changes    Row Name 04/17/20 1000             Response to Exercise   Blood Pressure (Admit) 118/64       Blood Pressure (Exercise) 186/82  rck 154/82       Blood Pressure (Exit) 124/72       Heart Rate (Admit) 92 bpm       Heart Rate (Exercise) 106 bpm       Heart Rate (Exit) 97 bpm       Oxygen Saturation (Admit) 99 %       Oxygen Saturation (Exercise) 99 %       Rating of Perceived Exertion (Exercise) 13       Symptoms none       Comments walk test results              Exercise Comments:   Exercise Goals and Review:  Exercise Goals    Row Name 04/17/20 1036             Exercise Goals   Increase Physical Activity Yes       Intervention Provide advice, education, support and counseling about physical activity/exercise needs.;Develop an individualized exercise prescription for aerobic and resistive training based on initial evaluation findings, risk stratification, comorbidities and participant's personal goals.       Expected Outcomes Short Term: Attend rehab on a regular basis to increase amount of physical activity.;Long Term: Add in  home exercise to make exercise part of routine and to increase amount of physical activity.;Long Term: Exercising regularly at least 3-5 days a week.       Increase Strength and Stamina Yes       Intervention Provide advice, education, support and counseling about physical activity/exercise needs.;Develop an individualized exercise prescription for aerobic and resistive training based on initial evaluation findings, risk stratification, comorbidities and participant's personal goals.       Expected Outcomes Short Term: Increase workloads from initial exercise prescription for resistance, speed, and METs.;Short Term: Perform resistance training exercises routinely during rehab and add in resistance training at home;Long Term: Improve cardiorespiratory fitness,  muscular endurance and strength as measured by increased METs and functional capacity (6MWT)       Able to understand and use rate of perceived exertion (RPE) scale Yes       Intervention Provide education and explanation on how to use RPE scale       Expected Outcomes Long Term:  Able to use RPE to guide intensity level when exercising independently;Short Term: Able to use RPE daily in rehab to express subjective intensity level       Able to understand and use Dyspnea scale Yes       Intervention Provide education and explanation on how to use Dyspnea scale       Expected Outcomes Long Term: Able to use Dyspnea scale to guide intensity level when exercising independently;Short Term: Able to use Dyspnea scale daily in rehab to express subjective sense of shortness of breath during exertion       Knowledge and understanding of Target Heart Rate Range (THRR) Yes       Intervention Provide education and explanation of THRR including how the numbers were predicted and where they are located for reference       Expected Outcomes Short Term: Able to state/look up THRR;Short Term: Able to use daily as guideline for intensity in rehab;Long Term: Able to use THRR to govern intensity when exercising independently       Able to check pulse independently Yes       Intervention Provide education and demonstration on how to check pulse in carotid and radial arteries.;Review the importance of being able to check your own pulse for safety during independent exercise       Expected Outcomes Short Term: Able to explain why pulse checking is important during independent exercise;Long Term: Able to check pulse independently and accurately       Understanding of Exercise Prescription Yes       Intervention Provide education, explanation, and written materials on patient's individual exercise prescription       Expected Outcomes Short Term: Able to explain program exercise prescription;Long Term: Able to explain home  exercise prescription to exercise independently              Exercise Goals Re-Evaluation :   Discharge Exercise Prescription (Final Exercise Prescription Changes):  Exercise Prescription Changes - 04/17/20 1000      Response to Exercise   Blood Pressure (Admit) 118/64    Blood Pressure (Exercise) 186/82   rck 154/82   Blood Pressure (Exit) 124/72    Heart Rate (Admit) 92 bpm    Heart Rate (Exercise) 106 bpm    Heart Rate (Exit) 97 bpm    Oxygen Saturation (Admit) 99 %    Oxygen Saturation (Exercise) 99 %    Rating of Perceived Exertion (Exercise) 13    Symptoms none  Comments walk test results           Nutrition:  Target Goals: Understanding of nutrition guidelines, daily intake of sodium <1537m, cholesterol <2036m calories 30% from fat and 7% or less from saturated fats, daily to have 5 or more servings of fruits and vegetables.  Education: Controlling Sodium/Reading Food Labels -Group verbal and written material supporting the discussion of sodium use in heart healthy nutrition. Review and explanation with models, verbal and written materials for utilization of the food label.   Education: General Nutrition Guidelines/Fats and Fiber: -Group instruction provided by verbal, written material, models and posters to present the general guidelines for heart healthy nutrition. Gives an explanation and review of dietary fats and fiber.   Biometrics:  Pre Biometrics - 04/17/20 1037      Pre Biometrics   Height 5' 3.75" (1.619 m)    Weight 132 lb 11.2 oz (60.2 kg)    BMI (Calculated) 22.96    Single Leg Stand 1.66 seconds            Nutrition Therapy Plan and Nutrition Goals:   Nutrition Assessments:  Nutrition Assessments - 04/11/20 1121      MEDFICTS Scores   Pre Score 3           MEDIFICTS Score Key:          ?70 Need to make dietary changes          40-70 Heart Healthy Diet         ? 40 Therapeutic Level Cholesterol Diet  Nutrition Goals  Re-Evaluation:   Nutrition Goals Discharge (Final Nutrition Goals Re-Evaluation):   Psychosocial: Target Goals: Acknowledge presence or absence of significant depression and/or stress, maximize coping skills, provide positive support system. Participant is able to verbalize types and ability to use techniques and skills needed for reducing stress and depression.   Education: Depression - Provides group verbal and written instruction on the correlation between heart/lung disease and depressed mood, treatment options, and the stigmas associated with seeking treatment.   Education: Sleep Hygiene -Provides group verbal and written instruction about how sleep can affect your health.  Define sleep hygiene, discuss sleep cycles and impact of sleep habits. Review good sleep hygiene tips.     Education: Stress and Anxiety: - Provides group verbal and written instruction about the health risks of elevated stress and causes of high stress.  Discuss the correlation between heart/lung disease and anxiety and treatment options. Review healthy ways to manage with stress and anxiety.    Initial Review & Psychosocial Screening:  Initial Psych Review & Screening - 04/10/20 1414      Initial Review   Current issues with Current Stress Concerns;Current Sleep Concerns    Source of Stress Concerns Unable to participate in former interests or hobbies;Unable to perform yard/household activities;Occupation    Comments Averages about 4-5 hours of sleep each night post surgery, eager to return to work      [place]   husband, daughter, mother, and granddaughter   Concerns No support system      Barriers   Psychosocial barriers to participate in program The patient should benefit from training in stress management and relaxation.;Psychosocial barriers identified (see note)      Screening Interventions   Interventions Encouraged to exercise;To provide support and resources  with identified psychosocial needs;Provide feedback about the scores to participant    Expected Outcomes Short Term goal: Utilizing psychosocial counselor, staff and physician  to assist with identification of specific Stressors or current issues interfering with healing process. Setting desired goal for each stressor or current issue identified.;Long Term Goal: Stressors or current issues are controlled or eliminated.;Short Term goal: Identification and review with participant of any Quality of Life or Depression concerns found by scoring the questionnaire.;Long Term goal: The participant improves quality of Life and PHQ9 Scores as seen by post scores and/or verbalization of changes           Quality of Life Scores:   Quality of Life - 04/11/20 1120      Quality of Life   Select Quality of Life      Quality of Life Scores   Health/Function Pre 22.1 %    Socioeconomic Pre 15.68 %    Psych/Spiritual Pre 26.78 %    Family Pre 10.8 %    GLOBAL Pre 19.95 %          Scores of 19 and below usually indicate a poorer quality of life in these areas.  A difference of  2-3 points is a clinically meaningful difference.  A difference of 2-3 points in the total score of the Quality of Life Index has been associated with significant improvement in overall quality of life, self-image, physical symptoms, and general health in studies assessing change in quality of life.  PHQ-9: Recent Review Flowsheet Data    Depression screen [facility] 2/9 04/17/2020 02/07/2020   Decreased Interest 0 0   Down, Depressed, Hopeless 0 0   PHQ - 2 Score 0 0   Altered sleeping 1 2   Tired, decreased energy 0 2   Change in appetite 0 0   Feeling bad or failure about yourself  0 0   Trouble concentrating 0 2   Moving slowly or fidgety/restless 0 0   Suicidal thoughts 0 0   PHQ-9 Score 1 6   Difficult doing work/chores Not difficult at all -     Interpretation of Total Score  Total Score Depression Severity:  1-4 = Minimal  depression, 5-9 = Mild depression, 10-14 = Moderate depression, 15-19 = Moderately severe depression, 20-27 = Severe depression   Psychosocial Evaluation and Intervention:   Psychosocial Re-Evaluation:   Psychosocial Discharge (Final Psychosocial Re-Evaluation):   Vocational Rehabilitation: Provide vocational rehab assistance to qualifying candidates.   Vocational Rehab Evaluation & Intervention:  Vocational Rehab - 04/10/20 1412      Initial Vocational Rehab Evaluation & Intervention   Assessment shows need for Vocational Rehabilitation No           Education: Education Goals: Education classes will be provided on a variety of topics geared toward better understanding of heart health and risk factor modification. Participant will state understanding/return demonstration of topics presented as noted by education test scores.  Learning Barriers/Preferences:  Learning Barriers/Preferences - 04/10/20 1412      Learning Barriers/Preferences   Learning Barriers Sight   glasses for reading   Learning Preferences Skilled Demonstration           General Cardiac Education Topics:  AED/CPR: - Group verbal and written instruction with the use of models to demonstrate the basic use of the AED with the basic ABC's of resuscitation.   Anatomy & Physiology of the Heart: - Group verbal and written instruction and models provide basic cardiac anatomy and physiology, with the coronary electrical and arterial systems. Review of Valvular disease and Heart Failure   Cardiac Procedures: - Group verbal and written instruction to review commonly  prescribed medications for heart disease. Reviews the medication, class of the drug, and side effects. Includes the steps to properly store meds and maintain the prescription regimen. (beta blockers and nitrates)   Cardiac Medications I: - Group verbal and written instruction to review commonly prescribed medications for heart disease. Reviews  the medication, class of the drug, and side effects. Includes the steps to properly store meds and maintain the prescription regimen.   Cardiac Medications II: -Group verbal and written instruction to review commonly prescribed medications for heart disease. Reviews the medication, class of the drug, and side effects. (all other drug classes)    Go Sex-Intimacy & Heart Disease, Get SMART - Goal Setting: - Group verbal and written instruction through game format to discuss heart disease and the return to sexual intimacy. Provides group verbal and written material to discuss and apply goal setting through the application of the S.M.A.R.T. Method.   Other Matters of the Heart: - Provides group verbal, written materials and models to describe Stable Angina and Peripheral Artery. Includes description of the disease process and treatment options available to the cardiac patient.   Infection Prevention: - Provides verbal and written material to individual with discussion of infection control including proper hand washing and proper equipment cleaning during exercise session.   Cardiac Rehab from 04/17/2020 in [facility] Cardiac and Pulmonary Rehab  Date 04/17/20  Educator [facility]  Instruction Review Code 1- Verbalizes Understanding      Falls Prevention: - Provides verbal and written material to individual with discussion of falls prevention and safety.   Cardiac Rehab from 04/17/2020 in [facility] Cardiac and Pulmonary Rehab  Date 04/17/20  Educator [facility]  Instruction Review Code 1- Verbalizes Understanding      Other: -Provides group and verbal instruction on various topics (see comments)   Knowledge Questionnaire Score:  Knowledge Questionnaire Score - 04/11/20 1121      Knowledge Questionnaire Score   Pre Score 23/26 Education Focus: Angina, Nutrition, Exercise           Core Components/Risk Factors/Patient Goals at Admission:  Personal Goals and Risk Factors at Admission - 04/17/20 1037        Core Components/Risk Factors/Patient Goals on Admission    Weight Management Yes;Weight Maintenance    Intervention Weight Management: Develop a combined nutrition and exercise program designed to reach desired caloric intake, while maintaining appropriate intake of nutrient and fiber, sodium and fats, and appropriate energy expenditure required for the weight goal.;Weight Management: Provide education and appropriate resources to help participant work on and attain dietary goals.    Admit Weight 132 lb 11.2 oz (60.2 kg)    Goal Weight: Short Term 130 lb (59 kg)    Goal Weight: Long Term 130 lb (59 kg)    Expected Outcomes Short Term: Continue to assess and modify interventions until short term weight is achieved;Long Term: Adherence to nutrition and physical activity/exercise program aimed toward attainment of established weight goal;Weight Maintenance: Understanding of the daily nutrition guidelines, which includes 25-35% calories from fat, 7% or less cal from saturated fats, less than 237m cholesterol, less than 1.5gm of sodium, & 5 or more servings of fruits and vegetables daily    Diabetes Yes    Intervention Provide education about signs/symptoms and action to take for hypo/hyperglycemia.;Provide education about proper nutrition, including hydration, and aerobic/resistive exercise prescription along with prescribed medications to achieve blood glucose in normal ranges: Fasting glucose 65-99 mg/dL    Expected Outcomes Short Term: Participant verbalizes understanding of  the signs/symptoms and immediate care of hyper/hypoglycemia, proper foot care and importance of medication, aerobic/resistive exercise and nutrition plan for blood glucose control.;Long Term: Attainment of HbA1C < 7%.    Hypertension Yes    Intervention Provide education on lifestyle modifcations including regular physical activity/exercise, weight management, moderate sodium restriction and increased consumption of fresh fruit,  vegetables, and low fat dairy, alcohol moderation, and smoking cessation.;Monitor prescription use compliance.    Expected Outcomes Short Term: Continued assessment and intervention until BP is < 140/82m HG in hypertensive participants. < 130/862mHG in hypertensive participants with diabetes, heart failure or chronic kidney disease.;Long Term: Maintenance of blood pressure at goal levels.    Lipids Yes    Intervention Provide education and support for participant on nutrition & aerobic/resistive exercise along with prescribed medications to achieve LDL <7054mHDL >3m77m  Expected Outcomes Short Term: Participant states understanding of desired cholesterol values and is compliant with medications prescribed. Participant is following exercise prescription and nutrition guidelines.;Long Term: Cholesterol controlled with medications as prescribed, with individualized exercise RX and with personalized nutrition plan. Value goals: LDL < 70mg52mL > 40 mg.           Education:Diabetes - Individual verbal and written instruction to review signs/symptoms of diabetes, desired ranges of glucose level fasting, after meals and with exercise. Acknowledge that pre and post exercise glucose checks will be done for 3 sessions at entry of program.   Cardiac Rehab from 04/17/2020 in [facility]  Date 04/10/20  Educator JH  [facility] Review Code 1- Verbalizes Understanding      Education: Know Your Numbers and Risk Factors: -Group verbal and written instruction about important numbers in your health.  Discussion of what are risk factors and how they play a role in the disease process.  Review of Cholesterol, Blood Pressure, Diabetes, and BMI and the role they play in your overall health.   Core Components/Risk Factors/Patient Goals Review:    Core Components/Risk Factors/Patient Goals at Discharge (Final Review):    ITP Comments:  ITP Comments    Row Name 04/10/20 1420 04/17/20  1030         ITP Comments Completed virtual orientation today.  EP evaluation is scheduled for Mon 6/14 at 930.  [facility]. Completed 6MWT and gym orientation.  Initial ITP created and sent for review to Dr. Mark Emily Filbertical Director.             Comments: Initial ITP

## 2020-04-18 ENCOUNTER — Other Ambulatory Visit: Payer: Self-pay

## 2020-04-18 ENCOUNTER — Encounter: Payer: BC Managed Care – PPO | Admitting: *Deleted

## 2020-04-18 DIAGNOSIS — I214 Non-ST elevation (NSTEMI) myocardial infarction: Secondary | ICD-10-CM

## 2020-04-18 DIAGNOSIS — Z7982 Long term (current) use of aspirin: Secondary | ICD-10-CM | POA: Diagnosis not present

## 2020-04-18 DIAGNOSIS — K219 Gastro-esophageal reflux disease without esophagitis: Secondary | ICD-10-CM | POA: Diagnosis not present

## 2020-04-18 DIAGNOSIS — Z794 Long term (current) use of insulin: Secondary | ICD-10-CM | POA: Diagnosis not present

## 2020-04-18 DIAGNOSIS — Z87440 Personal history of urinary (tract) infections: Secondary | ICD-10-CM | POA: Diagnosis not present

## 2020-04-18 DIAGNOSIS — I2510 Atherosclerotic heart disease of native coronary artery without angina pectoris: Secondary | ICD-10-CM | POA: Diagnosis not present

## 2020-04-18 DIAGNOSIS — Z79899 Other long term (current) drug therapy: Secondary | ICD-10-CM | POA: Diagnosis not present

## 2020-04-18 DIAGNOSIS — Z951 Presence of aortocoronary bypass graft: Secondary | ICD-10-CM

## 2020-04-18 DIAGNOSIS — I252 Old myocardial infarction: Secondary | ICD-10-CM | POA: Diagnosis not present

## 2020-04-18 DIAGNOSIS — Z87442 Personal history of urinary calculi: Secondary | ICD-10-CM | POA: Diagnosis not present

## 2020-04-18 DIAGNOSIS — E10319 Type 1 diabetes mellitus with unspecified diabetic retinopathy without macular edema: Secondary | ICD-10-CM | POA: Diagnosis not present

## 2020-04-18 DIAGNOSIS — E785 Hyperlipidemia, unspecified: Secondary | ICD-10-CM | POA: Diagnosis not present

## 2020-04-18 LAB — GLUCOSE, CAPILLARY
Glucose-Capillary: 120 mg/dL — ABNORMAL HIGH (ref 70–99)
Glucose-Capillary: 69 mg/dL — ABNORMAL LOW (ref 70–99)
Glucose-Capillary: 91 mg/dL (ref 70–99)

## 2020-04-18 NOTE — Progress Notes (Signed)
Daily Session Note  Patient Details  Name: Felicia [name] MRN: [id number] Date of Birth: Jul 09, 1961 Referring Provider:     Cardiac Rehab from 04/17/2020 in [facility]  Referring Provider Melodie Bouillon MD   [facility] Cardiologist: Dr. Arelia Sneddon Cro[place]]      Encounter Date: 04/18/2020  Check In:  Session Check In - 04/18/20 0948      Check-In   Supervising physician immediately available to respond to emergencies See telemetry face sheet for immediately available ER MD    Location [facility]-Cardiac & Pulmonary Rehab    Staff Present Heath Lark, RN, BSN, CCRP;Joseph Foy [place], [place], ACSM CEP, Exercise Physiologist    Virtual Visit No    Medication changes reported     No    Fall or balance concerns reported    No    Warm-up and Cool-down Performed on first and last piece of equipment    Resistance Training Performed Yes    VAD Patient? No    PAD/SET Patient? No      Pain Assessment   Currently in Pain? No/denies              Social History   Tobacco Use  Smoking Status Never Smoker  Smokeless Tobacco Never Used    Goals Met:  Exercise tolerated well Personal goals reviewed No report of cardiac concerns or symptoms  Goals Unmet:  Not Applicable  Comments: First full day of exercise!  Patient was oriented to gym and equipment including functions, settings, policies, and procedures.  Patient's individual exercise prescription and treatment plan were reviewed.  All starting workloads were established based on the results of the 6 minute walk test done at initial orientation visit.  The plan for exercise progression was also introduced and progression will be customized based on patient's performance and goals.      Dr. Emily Filbert is Medical Director for [place] and [name].

## 2020-04-19 ENCOUNTER — Encounter: Payer: Self-pay | Admitting: *Deleted

## 2020-04-19 DIAGNOSIS — Z951 Presence of aortocoronary bypass graft: Secondary | ICD-10-CM

## 2020-04-19 DIAGNOSIS — I214 Non-ST elevation (NSTEMI) myocardial infarction: Secondary | ICD-10-CM

## 2020-04-19 DIAGNOSIS — H8112 Benign paroxysmal vertigo, left ear: Secondary | ICD-10-CM | POA: Diagnosis not present

## 2020-04-19 NOTE — Progress Notes (Signed)
Cardiac Individual Treatment Plan  Patient Details  Name: Felicia [name] MRN: [id number] Date of Birth: 1961/08/11 Referring Provider:     Cardiac Rehab from 04/17/2020 in [facility] Cardiac and Pulmonary Rehab  Referring Provider Melodie Bouillon MD   [facility] Cardiologist: Dr. Arelia Sneddon Croitoru]      Initial Encounter Date:    Cardiac Rehab from 04/17/2020 in [facility] Cardiac and Pulmonary Rehab  Date 04/17/20      Visit Diagnosis: S/P CABG x 5  NSTEMI (non-ST elevated myocardial infarction) ([place])  Patient's Home Medications on Admission:  Current Outpatient Medications:  .  aspirin EC 325 MG EC tablet, Take 1 tablet (325 mg total) by mouth daily., Disp: 30 tablet, Rfl: 0 .  esomeprazole (NEXIUM) 40 MG capsule, Take 1 capsule (40 mg total) by mouth daily. Patient needs office visit for further refills, Disp: 90 capsule, Rfl: 0 .  Evolocumab (REPATHA SURECLICK) 333 MG/ML SOAJ, Inject 140 mg into the skin every 14 (fourteen) days., Disp: 2 pen, Rfl: 11 .  ezetimibe (ZETIA) 10 MG tablet, Take 1 tablet (10 mg total) by mouth daily., Disp: 30 tablet, Rfl: 2 .  glucagon (GLUCAGON EMERGENCY) 1 MG injection, Inject 1 mg into the muscle once as needed (in case of severe hypoglycemia)., Disp: 1 each, Rfl: 12 .  Insulin Glargine (LANTUS SOLOSTAR) 100 UNIT/ML Solostar Pen, Inject 12 Units into the skin daily. (Patient taking differently: Inject 8-10 Units into the skin See admin instructions. Inject 10 units into the skin in the morning and 8 units in the evening), Disp: 5 pen, Rfl: 11 .  Insulin Pen Needle (NOVOFINE) 32G X 6 MM MISC, USE TO INJECT INSULIN 4 TIMES A DAY, Disp: 200 each, Rfl: 1 .  insulin regular (NOVOLIN R) 100 units/mL injection, INJECT 6 UNITS TOTAL INTO THE SKIN 3 (THREE) TIMES DAILY BEFORE MEALS. (Patient taking differently: Inject 1-10 Units into the skin See admin instructions. For glucose 150-210 add 1 additional unit, per scale 211-270= 2 units, 271-330= 3 units, 331-390= 4 units  For  breakfast and dinner, administer 1 unit for every 10 grams of carbs. For snacks and dinner, administer 1 unit for every 20 grams of carbs.), Disp: 60 mL, Rfl: 1 .  Insulin Syringe-Needle U-100 30G X 5/16" 0.3 ML MISC, Use to inject insulin 6 times a day., Disp: 600 each, Rfl: 11 .  lovastatin (MEVACOR) 20 MG tablet, Take 1 tablet (20 mg total) by mouth at bedtime., Disp: 30 tablet, Rfl: 6 .  Multiple Vitamin (MULTIVITAMIN) tablet, Take 1 tablet by mouth daily.  , Disp: , Rfl:   Past Medical History: Past Medical History:  Diagnosis Date  . Allergy   . Anemia    past history long ago  . Borderline HTN (hypertension)   . CAD (coronary artery disease)    a. 02/2020 Cath: LM nl, LAD large, 90p, 62m D1 90p, LCX nl, OM 60-70p, RCA dominant w/ dampening upon engagement of ostium. 70p/m, RPDA 50. EF 55-65%.  . Disorder of kidney    has abd kidney (right)  . GERD (gastroesophageal reflux disease)   . History of diabetic gastroparesis   . History of echocardiogram    a. 01/2013 Echo: EF 55-65%, no rwma. Mild MR. Nl RV fxn.  .Marland KitchenHLD (hyperlipidemia)   . Hx: UTI (urinary tract infection)   . IBS (irritable bowel syndrome)   . Nephrolithiasis 2005  . Osteopenia 2012   per pt  . Systolic murmur 38/3291  mild mitral insuff  .  Type 1 diabetes mellitus with diabetic retinopathy without macular edema (HCC)    dx at 59 y/o, background retinopathy    Tobacco Use: Social History   Tobacco Use  Smoking Status Never Smoker  Smokeless Tobacco Never Used    Labs: Recent Review Flowsheet Data    Labs for ITP Cardiac and Pulmonary Rehab Latest Ref Rng & Units 02/21/2020 02/21/2020 02/21/2020 02/21/2020 02/21/2020   Cholestrol 0 - 200 mg/dL - - - - -   LDLCALC 0 - 99 mg/dL - - - - -   LDLDIRECT mg/dL - - - - -   HDL >39.00 mg/dL - - - - -   Trlycerides 0 - 149 mg/dL - - - - -   Hemoglobin A1c 4.8 - 5.6 % - - - - -   PHART 7.35 - 7.45 - 7.485(H) 7.344(L) 7.292(L) 7.314(L)   PCO2ART 32 - 48 mmHg -  28.8(L) 38.7 43.3 46.7   HCO3 20.0 - 28.0 mmol/L - 21.7 21.1 20.7 23.3   TCO2 22 - 32 mmol/L _0 ACIDBASEDEF 0.0 - 2.0 mmol/L - 1.0 4.0(H) 5.0(H) 3.0(H)   O2SAT % - 98.0 99.0 88.0 100.0       Exercise Target Goals: Exercise Program Goal: Individual exercise prescription set using results from initial 6 min walk test and THRR while considering  patient's activity barriers and safety.   Exercise Prescription Goal: Initial exercise prescription builds to 30-45 minutes a day of aerobic activity, 2-3 days per week.  Home exercise guidelines will be given to patient during program as part of exercise prescription that the participant will acknowledge.   Education: Aerobic Exercise & Resistance Training: - Gives group verbal and written instruction on the various components of exercise. Focuses on aerobic and resistive training programs and the benefits of this training and how to safely progress through these programs..   Education: Exercise & Equipment Safety: - Individual verbal instruction and demonstration of equipment use and safety with use of the equipment.   Cardiac Rehab from 04/17/2020 in [facility] Cardiac and Pulmonary Rehab  Date 04/17/20  Educator [facility]  Instruction Review Code 1- Verbalizes Understanding      Education: Exercise Physiology & General Exercise Guidelines: - Group verbal and written instruction with models to review the exercise physiology of the cardiovascular system and associated critical values. Provides general exercise guidelines with specific guidelines to those with heart or lung disease.    Education: Flexibility, Balance, Mind/Body Relaxation: Provides group verbal/written instruction on the benefits of flexibility and balance training, including mind/body exercise modes such as yoga, pilates and tai chi.  Demonstration and skill practice provided.   Activity Barriers & Risk Stratification:  Activity Barriers & Cardiac Risk Stratification -  04/17/20 1031      Activity Barriers & Cardiac Risk Stratification   Activity Barriers Deconditioning;Incisional Pain;Muscular Weakness;Joint Problems;Balance Concerns;Other (comment);History of Falls;Back Problems;Neck/Spine Problems    Comments no heavy weights for 3 more weeks    Cardiac Risk Stratification Moderate           6 Minute Walk:  6 Minute Walk    Row Name 04/17/20 1030         6 Minute Walk   Phase Initial     Distance 940 feet     Walk Time 6 minutes     # of Rest Breaks 0     MPH 1.78     METS 3.65     RPE 13  VO2 Peak 12.77     Symptoms No     Resting HR 92 bpm     Resting BP 118/64     Resting Oxygen Saturation  99 %     Exercise Oxygen Saturation  during 6 min walk 99 %     Max Ex. HR 106 bpm     Max Ex. BP 186/82     2 Minute Post BP 154/82            Oxygen Initial Assessment:   Oxygen Re-Evaluation:   Oxygen Discharge (Final Oxygen Re-Evaluation):   Initial Exercise Prescription:  Initial Exercise Prescription - 04/17/20 1000      Date of Initial Exercise RX and Referring Provider   Date 04/17/20    Referring Provider Melodie Bouillon MD    Primary Cardiologist: Dr. Arelia Sneddon Croitoru     Treadmill   MPH 1.7    Grade 0    Minutes 15    METs 2.3      REL-XR   Level 2    Speed 50    Minutes 15    METs 2.5      T5 Nustep   Level 3    SPM 80    Minutes 15    METs 2.5      Prescription Details   Frequency (times per week) 2    Duration Progress to 30 minutes of continuous aerobic without signs/symptoms of physical distress      Intensity   THRR 40-80% of Max Heartrate 120-149    Ratings of Perceived Exertion 11-13    Perceived Dyspnea 0-4      Progression   Progression Continue to progress workloads to maintain intensity without signs/symptoms of physical distress.      Resistance Training   Training Prescription Yes    Weight 3lbs (right), 1 lb (left)    Reps 10-15           Perform Capillary Blood  Glucose checks as needed.  Exercise Prescription Changes:  Exercise Prescription Changes    Row Name 04/17/20 1000             Response to Exercise   Blood Pressure (Admit) 118/64       Blood Pressure (Exercise) 186/82  rck 154/82       Blood Pressure (Exit) 124/72       Heart Rate (Admit) 92 bpm       Heart Rate (Exercise) 106 bpm       Heart Rate (Exit) 97 bpm       Oxygen Saturation (Admit) 99 %       Oxygen Saturation (Exercise) 99 %       Rating of Perceived Exertion (Exercise) 13       Symptoms none       Comments walk test results              Exercise Comments:  Exercise Comments    Row Name 04/18/20 0949           Exercise Comments First full day of exercise!  Patient was oriented to gym and equipment including functions, settings, policies, and procedures.  Patient's individual exercise prescription and treatment plan were reviewed.  All starting workloads were established based on the results of the 6 minute walk test done at initial orientation visit.  The plan for exercise progression was also introduced and progression will be customized based on patient's performance and goals.  Exercise Goals and Review:  Exercise Goals    Row Name 04/17/20 1036             Exercise Goals   Increase Physical Activity Yes       Intervention Provide advice, education, support and counseling about physical activity/exercise needs.;Develop an individualized exercise prescription for aerobic and resistive training based on initial evaluation findings, risk stratification, comorbidities and participant's personal goals.       Expected Outcomes Short Term: Attend rehab on a regular basis to increase amount of physical activity.;Long Term: Add in home exercise to make exercise part of routine and to increase amount of physical activity.;Long Term: Exercising regularly at least 3-5 days a week.       Increase Strength and Stamina Yes       Intervention Provide  advice, education, support and counseling about physical activity/exercise needs.;Develop an individualized exercise prescription for aerobic and resistive training based on initial evaluation findings, risk stratification, comorbidities and participant's personal goals.       Expected Outcomes Short Term: Increase workloads from initial exercise prescription for resistance, speed, and METs.;Short Term: Perform resistance training exercises routinely during rehab and add in resistance training at home;Long Term: Improve cardiorespiratory fitness, muscular endurance and strength as measured by increased METs and functional capacity (6MWT)       Able to understand and use rate of perceived exertion (RPE) scale Yes       Intervention Provide education and explanation on how to use RPE scale       Expected Outcomes Long Term:  Able to use RPE to guide intensity level when exercising independently;Short Term: Able to use RPE daily in rehab to express subjective intensity level       Able to understand and use Dyspnea scale Yes       Intervention Provide education and explanation on how to use Dyspnea scale       Expected Outcomes Long Term: Able to use Dyspnea scale to guide intensity level when exercising independently;Short Term: Able to use Dyspnea scale daily in rehab to express subjective sense of shortness of breath during exertion       Knowledge and understanding of Target Heart Rate Range (THRR) Yes       Intervention Provide education and explanation of THRR including how the numbers were predicted and where they are located for reference       Expected Outcomes Short Term: Able to state/look up THRR;Short Term: Able to use daily as guideline for intensity in rehab;Long Term: Able to use THRR to govern intensity when exercising independently       Able to check pulse independently Yes       Intervention Provide education and demonstration on how to check pulse in carotid and radial arteries.;Review  the importance of being able to check your own pulse for safety during independent exercise       Expected Outcomes Short Term: Able to explain why pulse checking is important during independent exercise;Long Term: Able to check pulse independently and accurately       Understanding of Exercise Prescription Yes       Intervention Provide education, explanation, and written materials on patient's individual exercise prescription       Expected Outcomes Short Term: Able to explain program exercise prescription;Long Term: Able to explain home exercise prescription to exercise independently              Exercise Goals Re-Evaluation :  Exercise Goals Re-Evaluation  [place] Name 04/18/20 0949             Exercise Goal Re-Evaluation   Exercise Goals Review Able to understand and use rate of perceived exertion (RPE) scale;Knowledge and understanding of Target Heart Rate Range (THRR);Understanding of Exercise Prescription       Comments Reviewed RPE and dyspnea scales, THR and program prescription with pt today.  Pt voiced understanding and was given a copy of goals to take home.       Expected Outcomes Short: Use RPE daily to regulate intensity. Long: Follow program prescription in THR.              Discharge Exercise Prescription (Final Exercise Prescription Changes):  Exercise Prescription Changes - 04/17/20 1000      Response to Exercise   Blood Pressure (Admit) 118/64    Blood Pressure (Exercise) 186/82   rck 154/82   Blood Pressure (Exit) 124/72    Heart Rate (Admit) 92 bpm    Heart Rate (Exercise) 106 bpm    Heart Rate (Exit) 97 bpm    Oxygen Saturation (Admit) 99 %    Oxygen Saturation (Exercise) 99 %    Rating of Perceived Exertion (Exercise) 13    Symptoms none    Comments walk test results           Nutrition:  Target Goals: Understanding of nutrition guidelines, daily intake of sodium '1500mg'$ , cholesterol '200mg'$ , calories 30% from fat and 7% or less from saturated  fats, daily to have 5 or more servings of fruits and vegetables.  Education: Controlling Sodium/Reading Food Labels -Group verbal and written material supporting the discussion of sodium use in heart healthy nutrition. Review and explanation with models, verbal and written materials for utilization of the food label.   Education: General Nutrition Guidelines/Fats and Fiber: -Group instruction provided by verbal, written material, models and posters to present the general guidelines for heart healthy nutrition. Gives an explanation and review of dietary fats and fiber.   Biometrics:  Pre Biometrics - 04/17/20 1037      Pre Biometrics   Height 5' 3.75" (1.619 m)    Weight 132 lb 11.2 oz (60.2 kg)    BMI (Calculated) 22.96    Single Leg Stand 1.66 seconds            Nutrition Therapy Plan and Nutrition Goals:   Nutrition Assessments:  Nutrition Assessments - 04/11/20 1121      MEDFICTS Scores   Pre Score 3           MEDIFICTS Score Key:          ?70 Need to make dietary changes          40-70 Heart Healthy Diet         ? 40 Therapeutic Level Cholesterol Diet  Nutrition Goals Re-Evaluation:   Nutrition Goals Discharge (Final Nutrition Goals Re-Evaluation):   Psychosocial: Target Goals: Acknowledge presence or absence of significant depression and/or stress, maximize coping skills, provide positive support system. Participant is able to verbalize types and ability to use techniques and skills needed for reducing stress and depression.   Education: Depression - Provides group verbal and written instruction on the correlation between heart/lung disease and depressed mood, treatment options, and the stigmas associated with seeking treatment.   Education: Sleep Hygiene -Provides group verbal and written instruction about how sleep can affect your health.  Define sleep hygiene, discuss sleep cycles and impact of sleep habits. Review good sleep hygiene tips.  Education: Stress and Anxiety: - Provides group verbal and written instruction about the health risks of elevated stress and causes of high stress.  Discuss the correlation between heart/lung disease and anxiety and treatment options. Review healthy ways to manage with stress and anxiety.    Initial Review & Psychosocial Screening:  Initial Psych Review & Screening - 04/10/20 1414      Initial Review   Current issues with Current Stress Concerns;Current Sleep Concerns    Source of Stress Concerns Unable to participate in former interests or hobbies;Unable to perform yard/household activities;Occupation    Comments Averages about 4-5 hours of sleep each night post surgery, eager to return to work      [place]? Yes   husband, daughter, mother, and granddaughter   Concerns No support system      Barriers   Psychosocial barriers to participate in program The patient should benefit from training in stress management and relaxation.;Psychosocial barriers identified (see note)      Screening Interventions   Interventions Encouraged to exercise;To provide support and resources with identified psychosocial needs;Provide feedback about the scores to participant    Expected Outcomes Short Term goal: Utilizing psychosocial counselor, staff and physician to assist with identification of specific Stressors or current issues interfering with healing process. Setting desired goal for each stressor or current issue identified.;Long Term Goal: Stressors or current issues are controlled or eliminated.;Short Term goal: Identification and review with participant of any Quality of Life or Depression concerns found by scoring the questionnaire.;Long Term goal: The participant improves quality of Life and PHQ9 Scores as seen by post scores and/or verbalization of changes           Quality of Life Scores:   Quality of Life - 04/11/20 1120      Quality of Life   Select  Quality of Life      Quality of Life Scores   Health/Function Pre 22.1 %    Socioeconomic Pre 15.68 %    Psych/Spiritual Pre 26.78 %    Family Pre 10.8 %    GLOBAL Pre 19.95 %          Scores of 19 and below usually indicate a poorer quality of life in these areas.  A difference of  2-3 points is a clinically meaningful difference.  A difference of 2-3 points in the total score of the Quality of Life Index has been associated with significant improvement in overall quality of life, self-image, physical symptoms, and general health in studies assessing change in quality of life.  PHQ-9: Recent Review Flowsheet Data    Depression screen [facility] 2/9 04/17/2020 02/07/2020   Decreased Interest 0 0   Down, Depressed, Hopeless 0 0   PHQ - 2 Score 0 0   Altered sleeping 1 2   Tired, decreased energy 0 2   Change in appetite 0 0   Feeling bad or failure about yourself  0 0   Trouble concentrating 0 2   Moving slowly or fidgety/restless 0 0   Suicidal thoughts 0 0   PHQ-9 Score 1 6   Difficult doing work/chores Not difficult at all -     Interpretation of Total Score  Total Score Depression Severity:  1-4 = Minimal depression, 5-9 = Mild depression, 10-14 = Moderate depression, 15-19 = Moderately severe depression, 20-27 = Severe depression   Psychosocial Evaluation and Intervention:   Psychosocial Re-Evaluation:   Psychosocial Discharge (Final Psychosocial Re-Evaluation):  Vocational Rehabilitation: Provide vocational rehab assistance to qualifying candidates.   Vocational Rehab Evaluation & Intervention:  Vocational Rehab - 04/10/20 1412      Initial Vocational Rehab Evaluation & Intervention   Assessment shows need for Vocational Rehabilitation No           Education: Education Goals: Education classes will be provided on a variety of topics geared toward better understanding of heart health and risk factor modification. Participant will state understanding/return  demonstration of topics presented as noted by education test scores.  Learning Barriers/Preferences:  Learning Barriers/Preferences - 04/10/20 1412      Learning Barriers/Preferences   Learning Barriers Sight   glasses for reading   Learning Preferences Skilled Demonstration           General Cardiac Education Topics:  AED/CPR: - Group verbal and written instruction with the use of models to demonstrate the basic use of the AED with the basic ABC's of resuscitation.   Anatomy & Physiology of the Heart: - Group verbal and written instruction and models provide basic cardiac anatomy and physiology, with the coronary electrical and arterial systems. Review of Valvular disease and Heart Failure   Cardiac Procedures: - Group verbal and written instruction to review commonly prescribed medications for heart disease. Reviews the medication, class of the drug, and side effects. Includes the steps to properly store meds and maintain the prescription regimen. (beta blockers and nitrates)   Cardiac Medications I: - Group verbal and written instruction to review commonly prescribed medications for heart disease. Reviews the medication, class of the drug, and side effects. Includes the steps to properly store meds and maintain the prescription regimen.   Cardiac Medications II: -Group verbal and written instruction to review commonly prescribed medications for heart disease. Reviews the medication, class of the drug, and side effects. (all other drug classes)    Go Sex-Intimacy & Heart Disease, Get SMART - Goal Setting: - Group verbal and written instruction through game format to discuss heart disease and the return to sexual intimacy. Provides group verbal and written material to discuss and apply goal setting through the application of the S.M.A.R.T. Method.   Other Matters of the Heart: - Provides group verbal, written materials and models to describe Stable Angina and Peripheral  Artery. Includes description of the disease process and treatment options available to the cardiac patient.   Infection Prevention: - Provides verbal and written material to individual with discussion of infection control including proper hand washing and proper equipment cleaning during exercise session.   Cardiac Rehab from 04/17/2020 in [facility] Cardiac and Pulmonary Rehab  Date 04/17/20  Educator [facility]  Instruction Review Code 1- Verbalizes Understanding      Falls Prevention: - Provides verbal and written material to individual with discussion of falls prevention and safety.   Cardiac Rehab from 04/17/2020 in [facility] Cardiac and Pulmonary Rehab  Date 04/17/20  Educator [facility]  Instruction Review Code 1- Verbalizes Understanding      Other: -Provides group and verbal instruction on various topics (see comments)   Knowledge Questionnaire Score:  Knowledge Questionnaire Score - 04/11/20 1121      Knowledge Questionnaire Score   Pre Score 23/26 Education Focus: Angina, Nutrition, Exercise           Core Components/Risk Factors/Patient Goals at Admission:  Personal Goals and Risk Factors at Admission - 04/17/20 1037      Core Components/Risk Factors/Patient Goals on Admission    Weight Management Yes;Weight Maintenance    Intervention  Weight Management: Develop a combined nutrition and exercise program designed to reach desired caloric intake, while maintaining appropriate intake of nutrient and fiber, sodium and fats, and appropriate energy expenditure required for the weight goal.;Weight Management: Provide education and appropriate resources to help participant work on and attain dietary goals.    Admit Weight 132 lb 11.2 oz (60.2 kg)    Goal Weight: Short Term 130 lb (59 kg)    Goal Weight: Long Term 130 lb (59 kg)    Expected Outcomes Short Term: Continue to assess and modify interventions until short term weight is achieved;Long Term: Adherence to nutrition and physical  activity/exercise program aimed toward attainment of established weight goal;Weight Maintenance: Understanding of the daily nutrition guidelines, which includes 25-35% calories from fat, 7% or less cal from saturated fats, less than '200mg'$  cholesterol, less than 1.5gm of sodium, & 5 or more servings of fruits and vegetables daily    Diabetes Yes    Intervention Provide education about signs/symptoms and action to take for hypo/hyperglycemia.;Provide education about proper nutrition, including hydration, and aerobic/resistive exercise prescription along with prescribed medications to achieve blood glucose in normal ranges: Fasting glucose 65-99 mg/dL    Expected Outcomes Short Term: Participant verbalizes understanding of the signs/symptoms and immediate care of hyper/hypoglycemia, proper foot care and importance of medication, aerobic/resistive exercise and nutrition plan for blood glucose control.;Long Term: Attainment of HbA1C < 7%.    Hypertension Yes    Intervention Provide education on lifestyle modifcations including regular physical activity/exercise, weight management, moderate sodium restriction and increased consumption of fresh fruit, vegetables, and low fat dairy, alcohol moderation, and smoking cessation.;Monitor prescription use compliance.    Expected Outcomes Short Term: Continued assessment and intervention until BP is < 140/109m HG in hypertensive participants. < 130/86mHG in hypertensive participants with diabetes, heart failure or chronic kidney disease.;Long Term: Maintenance of blood pressure at goal levels.    Lipids Yes    Intervention Provide education and support for participant on nutrition & aerobic/resistive exercise along with prescribed medications to achieve LDL '70mg'$ , HDL >'40mg'$ .    Expected Outcomes Short Term: Participant states understanding of desired cholesterol values and is compliant with medications prescribed. Participant is following exercise prescription and  nutrition guidelines.;Long Term: Cholesterol controlled with medications as prescribed, with individualized exercise RX and with personalized nutrition plan. Value goals: LDL < '70mg'$ , HDL > 40 mg.           Education:Diabetes - Individual verbal and written instruction to review signs/symptoms of diabetes, desired ranges of glucose level fasting, after meals and with exercise. Acknowledge that pre and post exercise glucose checks will be done for 3 sessions at entry of program.   [facility]  Date 04/10/20  Educator JHSame Day Procedures LLCInstruction Review Code 1- Verbalizes Understanding      Education: Know Your Numbers and Risk Factors: -Group verbal and written instruction about important numbers in your health.  Discussion of what are risk factors and how they play a role in the disease process.  Review of Cholesterol, Blood Pressure, Diabetes, and BMI and the role they play in your overall health.   Core Components/Risk Factors/Patient Goals Review:    Core Components/Risk Factors/Patient Goals at Discharge (Final Review):    ITP Comments:  ITP Comments    Row Name 04/10/20 1420 04/17/20 1030 04/18/20 0949 04/19/20 0641     ITP Comments Completed virtual orientation today.  EP evaluation is scheduled for Mon 6/14  at 930.  Documentation for diagnosis can be found in [facility] encounter 4/2, 4/19, 4/25. Completed 6MWT and gym orientation.  Initial ITP created and sent for review to Dr. Emily Filbert, Medical Director. First full day of exercise!  Patient was oriented to gym and equipment including functions, settings, policies, and procedures.  Patient's individual exercise prescription and treatment plan were reviewed.  All starting workloads were established based on the results of the 6 minute walk test done at initial orientation visit.  The plan for exercise progression was also introduced and progression will be customized based on patient's  performance and goals. 30 Day review completed. Medical Director ITP review done, changes made as directed, and signed approval by Medical Director.           Comments: 30 Day review completed. Medical Director ITP review done, changes made as directed, and signed approval by Medical Director.

## 2020-04-20 ENCOUNTER — Encounter: Payer: BC Managed Care – PPO | Admitting: *Deleted

## 2020-04-20 ENCOUNTER — Other Ambulatory Visit: Payer: Self-pay

## 2020-04-20 DIAGNOSIS — K219 Gastro-esophageal reflux disease without esophagitis: Secondary | ICD-10-CM | POA: Diagnosis not present

## 2020-04-20 DIAGNOSIS — Z951 Presence of aortocoronary bypass graft: Secondary | ICD-10-CM

## 2020-04-20 DIAGNOSIS — I214 Non-ST elevation (NSTEMI) myocardial infarction: Secondary | ICD-10-CM

## 2020-04-20 DIAGNOSIS — E785 Hyperlipidemia, unspecified: Secondary | ICD-10-CM | POA: Diagnosis not present

## 2020-04-20 DIAGNOSIS — Z87442 Personal history of urinary calculi: Secondary | ICD-10-CM | POA: Diagnosis not present

## 2020-04-20 DIAGNOSIS — Z79899 Other long term (current) drug therapy: Secondary | ICD-10-CM | POA: Diagnosis not present

## 2020-04-20 DIAGNOSIS — I2510 Atherosclerotic heart disease of native coronary artery without angina pectoris: Secondary | ICD-10-CM | POA: Diagnosis not present

## 2020-04-20 DIAGNOSIS — Z7982 Long term (current) use of aspirin: Secondary | ICD-10-CM | POA: Diagnosis not present

## 2020-04-20 DIAGNOSIS — I252 Old myocardial infarction: Secondary | ICD-10-CM | POA: Diagnosis not present

## 2020-04-20 DIAGNOSIS — Z794 Long term (current) use of insulin: Secondary | ICD-10-CM | POA: Diagnosis not present

## 2020-04-20 DIAGNOSIS — Z87440 Personal history of urinary (tract) infections: Secondary | ICD-10-CM | POA: Diagnosis not present

## 2020-04-20 DIAGNOSIS — E10319 Type 1 diabetes mellitus with unspecified diabetic retinopathy without macular edema: Secondary | ICD-10-CM | POA: Diagnosis not present

## 2020-04-20 LAB — GLUCOSE, CAPILLARY
Glucose-Capillary: 191 mg/dL — ABNORMAL HIGH (ref 70–99)
Glucose-Capillary: 179 mg/dL — ABNORMAL HIGH (ref 70–99)

## 2020-04-20 NOTE — Progress Notes (Signed)
Daily Session Note  Patient Details  Name: Felicia [name] MRN: [id number] Date of Birth: Jun 22, 1961 Referring Provider:     Cardiac Rehab from 04/17/2020 in [facility] Cardiac and Pulmonary Rehab  Referring Provider Melodie Bouillon MD   [facility] Cardiologist: Dr. Arelia Sneddon Croitoru]      Encounter Date: 04/20/2020  Check In:  Session Check In - 04/20/20 0842      Check-In   Supervising physician immediately available to respond to emergencies See telemetry face sheet for immediately available ER MD    Location [facility]-Cardiac & Pulmonary Rehab    Staff Present Heath Lark, RN, BSN, CCRP;Melissa Caiola RDN, Rowe Pavy, BA, ACSM CEP, Exercise Physiologist    Virtual Visit No    Medication changes reported     No    Fall or balance concerns reported    No    Warm-up and Cool-down Performed on first and last piece of equipment    Resistance Training Performed Yes    VAD Patient? No    PAD/SET Patient? No      Pain Assessment   Currently in Pain? No/denies              Social History   Tobacco Use  Smoking Status Never Smoker  Smokeless Tobacco Never Used    Goals Met:  Independence with exercise equipment Exercise tolerated well No report of cardiac concerns or symptoms  Goals Unmet:  Not Applicable  Comments: Pt able to follow exercise prescription today without complaint.  Will continue to monitor for progression.    Dr. Emily Filbert is Medical Director for [place] and [name].

## 2020-04-21 ENCOUNTER — Ambulatory Visit: Payer: BC Managed Care – PPO | Admitting: Family Medicine

## 2020-04-21 ENCOUNTER — Other Ambulatory Visit: Payer: Self-pay

## 2020-04-21 ENCOUNTER — Encounter: Payer: Self-pay | Admitting: Family Medicine

## 2020-04-21 VITALS — Temp 97.6°F | Ht 64.0 in | Wt 132.0 lb

## 2020-04-21 DIAGNOSIS — E1059 Type 1 diabetes mellitus with other circulatory complications: Secondary | ICD-10-CM | POA: Diagnosis not present

## 2020-04-21 DIAGNOSIS — IMO0002 Reserved for concepts with insufficient information to code with codable children: Secondary | ICD-10-CM

## 2020-04-21 DIAGNOSIS — M79604 Pain in right leg: Secondary | ICD-10-CM

## 2020-04-21 DIAGNOSIS — H8110 Benign paroxysmal vertigo, unspecified ear: Secondary | ICD-10-CM

## 2020-04-21 DIAGNOSIS — I951 Orthostatic hypotension: Secondary | ICD-10-CM

## 2020-04-21 DIAGNOSIS — M79605 Pain in left leg: Secondary | ICD-10-CM

## 2020-04-21 DIAGNOSIS — E785 Hyperlipidemia, unspecified: Secondary | ICD-10-CM

## 2020-04-21 DIAGNOSIS — R42 Dizziness and giddiness: Secondary | ICD-10-CM

## 2020-04-21 DIAGNOSIS — T8149XA Infection following a procedure, other surgical site, initial encounter: Secondary | ICD-10-CM | POA: Diagnosis not present

## 2020-04-21 DIAGNOSIS — I10 Essential (primary) hypertension: Secondary | ICD-10-CM

## 2020-04-21 DIAGNOSIS — Z951 Presence of aortocoronary bypass graft: Secondary | ICD-10-CM

## 2020-04-21 DIAGNOSIS — E1069 Type 1 diabetes mellitus with other specified complication: Secondary | ICD-10-CM

## 2020-04-21 DIAGNOSIS — S52532D Colles' fracture of left radius, subsequent encounter for closed fracture with routine healing: Secondary | ICD-10-CM | POA: Diagnosis not present

## 2020-04-21 DIAGNOSIS — E1065 Type 1 diabetes mellitus with hyperglycemia: Secondary | ICD-10-CM

## 2020-04-21 NOTE — Patient Instructions (Addendum)
Add neosporin antibiotic cream to healing wound on leg - twice daily.  Ensure good hydration - increase water - prior to cardiac rehab.  I think this unsteady feeling may be coming from trouble regulating blood pressure with position changes.  Goal 48 oz to start, increase if able after that.

## 2020-04-21 NOTE — Progress Notes (Signed)
This visit was conducted in person.  Temp 97.6 F (36.4 C)   Ht 5\' 4"  (1.626 m)   Wt 132 lb (59.9 kg)   SpO2 97%   BMI 22.66 kg/m   No data found.  Supine: 146/78 Standing: 128/80  CC: R leg pain, dizziness Subjective:    Patient ID: , female    DOB: 10-Sep-1961, 59 y.o.   MRN: [id number]  HPI: Felicia [name] is a 59 y.o. female presenting on 04/21/2020 for Leg Pain (Right medial side still have redness and denied pain/fever.) and Follow-up (Dizziness)   See prior notes for details. Referred to ENT for vestibular rehab for BPV s/p several treatments with epley repositioning maneuvers completed with benefit, last seen 04/19/2020.   Ongoing "uneasy feeling" instability when first standing or bending over - vestibular rehab suggested tilt table test. Anticipate component of orthostatic hypotension.   She is undergoing cardiac rehab after recent 5v CABG - started this week.  Saw endo for T1DM - planning to start using Dexcom CGM.  Started repatha this month for HLD - tolerated first shot well.   MRA ordered by neurology, still pending.  H/o orthostatic syncope with resultant wrist and orbital fracture.   No chest pain, dyspnea, palpitations, headache.   Brings BP log:  90-130/70s     Relevant past medical, surgical, family and social history reviewed and updated as indicated. Interim medical history since our last visit reviewed. Allergies and medications reviewed and updated. Outpatient Medications Prior to Visit  Medication Sig Dispense Refill  . aspirin EC 325 MG EC tablet Take 1 tablet (325 mg total) by mouth daily. 30 tablet 0  . esomeprazole (NEXIUM) 40 MG capsule Take 1 capsule (40 mg total) by mouth daily. Patient needs office visit for further refills 90 capsule 0  . Evolocumab (REPATHA SURECLICK) 140 MG/ML SOAJ Inject 140 mg into the skin every 14 (fourteen) days. 2 pen 11  . ezetimibe (ZETIA) 10 MG tablet Take 1 tablet (10 mg total) by mouth daily.  30 tablet 2  . glucagon (GLUCAGON EMERGENCY) 1 MG injection Inject 1 mg into the muscle once as needed (in case of severe hypoglycemia). 1 each 12  . Insulin Glargine (LANTUS SOLOSTAR) 100 UNIT/ML Solostar Pen Inject 12 Units into the skin daily. (Patient taking differently: Inject 8-10 Units into the skin See admin instructions. Inject 10 units into the skin in the morning and 8 units in the evening) 5 pen 11  . Insulin Pen Needle (NOVOFINE) 32G X 6 MM MISC USE TO INJECT INSULIN 4 TIMES A DAY 200 each 1  . insulin regular (NOVOLIN R) 100 units/mL injection INJECT 6 UNITS TOTAL INTO THE SKIN 3 (THREE) TIMES DAILY BEFORE MEALS. (Patient taking differently: Inject 1-10 Units into the skin See admin instructions. For glucose 150-210 add 1 additional unit, per scale 211-270= 2 units, 271-330= 3 units, 331-390= 4 units  For breakfast and dinner, administer 1 unit for every 10 grams of carbs. For snacks and dinner, administer 1 unit for every 20 grams of carbs.) 60 mL 1  . Insulin Syringe-Needle U-100 30G X 5/16" 0.3 ML MISC Use to inject insulin 6 times a day. 600 each 11  . Multiple Vitamin (MULTIVITAMIN) tablet Take 1 tablet by mouth daily.      6/16 lovastatin (MEVACOR) 20 MG tablet Take 1 tablet (20 mg total) by mouth at bedtime. 30 tablet 6   No facility-administered medications prior to visit.     Per  HPI unless specifically indicated in ROS section below Review of Systems Objective:  Temp 97.6 F (36.4 C)   Ht 5\' 4"  (1.626 m)   Wt 132 lb (59.9 kg)   SpO2 97%   BMI 22.66 kg/m   Wt Readings from Last 3 Encounters:  04/21/20 132 lb (59.9 kg)  04/17/20 132 lb 11.2 oz (60.2 kg)  04/13/20 133 lb (60.3 kg)      Physical Exam Vitals and nursing note reviewed.  Constitutional:      Appearance: Normal appearance. She is not ill-appearing.  Eyes:     Extraocular Movements: Extraocular movements intact.     Pupils: Pupils are equal, round, and reactive to light.  Cardiovascular:     Rate and  Rhythm: Normal rate and regular rhythm.     Pulses: Normal pulses.     Heart sounds: Normal heart sounds. No murmur heard.   Pulmonary:     Effort: Pulmonary effort is normal. No respiratory distress.     Breath sounds: Normal breath sounds. No wheezing or rales.  Musculoskeletal:     Right lower leg: No edema.     Left lower leg: No edema.  Skin:    General: Skin is warm and dry.     Findings: Erythema present.          Comments: Mild erythema surrounding healing scab R inner leg below knee at site of vascular surgery incision  Neurological:     Mental Status: She is alert.       Results for orders placed or performed in visit on 04/20/20  Glucose, capillary  Result Value Ref Range   Glucose-Capillary 191 (H) 70 - 99 mg/dL  Glucose, capillary  Result Value Ref Range   Glucose-Capillary 179 (H) 70 - 99 mg/dL   Assessment & Plan:  This visit occurred during the SARS-CoV-2 public health emergency.  Safety protocols were in place, including screening questions prior to the visit, additional usage of staff PPE, and extensive cleaning of exam room while observing appropriate contact time as indicated for disinfecting solutions.   Problem List Items Addressed This Visit    Uncontrolled type 1 diabetes mellitus with circulatory complication, with long-term current use of insulin (HCC)    Significant improvement in sugar control since heart bypass surgery. Appreciate endo care.       Syncope due to orthostatic hypotension    H/o this earlier this year with resultant wrist and orbital fractures. Now off all antihypertensives.       S/P CABG (coronary artery bypass graft)   Leg pain, bilateral    Statin related.  Improving off statin (now on repatha)      Inflammation of operative incision    Persistence of mild surrounding erythema to R inner leg incision at site of vascular surgery 3 months ago, will recommend start triple abx ointment bid for a week to see if any improvement.        Hyperlipidemia due to type 1 diabetes mellitus ([place])    Has had 1st repatha injection, seems to be tolerating well so far. Now off statin but zetia continued.       Essential hypertension    Largely stable readings at home.  Mild orthostasis on BP testing today - supine drops 18 lbs.  See below.       Dizziness - Primary    Discussed possible component of multifactorial autonomic instability - diabetic neuropathy, dehydration, recent bypass surgery. Known diabetic gastroparesis.  Encouraged good hydration status,  consider compression stockings in the future.  Continue cardiac rehab, encouraged supervision with transition off machines (given positional nature of dizziness). F/u with cardiology in August.       BPPV (benign paroxysmal positional vertigo)    Completed vestibular rehab through ENT with significant improvement in vertiginous dizziness symptoms.           No orders of the defined types were placed in this encounter.  No orders of the defined types were placed in this encounter.   Patient Instructions  Add neosporin antibiotic cream to healing wound on leg - twice daily.  Ensure good hydration - increase water - prior to cardiac rehab.  I think this unsteady feeling may be coming from trouble regulating blood pressure with position changes.  Goal 48 oz to start, increase if able after that.    Follow up plan: Return if symptoms worsen or fail to improve.  [name], MD

## 2020-04-22 DIAGNOSIS — T8149XA Infection following a procedure, other surgical site, initial encounter: Secondary | ICD-10-CM | POA: Insufficient documentation

## 2020-04-22 DIAGNOSIS — H8110 Benign paroxysmal vertigo, unspecified ear: Secondary | ICD-10-CM | POA: Insufficient documentation

## 2020-04-22 NOTE — Assessment & Plan Note (Addendum)
Significant improvement in sugar control since heart bypass surgery. Appreciate endo care.

## 2020-04-22 NOTE — Assessment & Plan Note (Signed)
Statin related.  Improving off statin (now on repatha)

## 2020-04-22 NOTE — Assessment & Plan Note (Signed)
H/o this earlier this year with resultant wrist and orbital fractures. Now off all antihypertensives.

## 2020-04-22 NOTE — Assessment & Plan Note (Addendum)
Discussed possible component of multifactorial autonomic instability - diabetic neuropathy, dehydration, recent bypass surgery. Known diabetic gastroparesis.  Encouraged good hydration status, consider compression stockings in the future.  Continue cardiac rehab, encouraged supervision with transition off machines (given positional nature of dizziness). F/u with cardiology in August.

## 2020-04-22 NOTE — Assessment & Plan Note (Addendum)
Has had 1st repatha injection, seems to be tolerating well so far. Now off statin but zetia continued.

## 2020-04-22 NOTE — Assessment & Plan Note (Signed)
Persistence of mild surrounding erythema to R inner leg incision at site of vascular surgery 3 months ago, will recommend start triple abx ointment bid for a week to see if any improvement.

## 2020-04-22 NOTE — Assessment & Plan Note (Signed)
Largely stable readings at home.  Mild orthostasis on BP testing today - supine drops 18 lbs.  See below.

## 2020-04-22 NOTE — Assessment & Plan Note (Signed)
Completed vestibular rehab through ENT with significant improvement in vertiginous dizziness symptoms.

## 2020-04-25 ENCOUNTER — Encounter: Payer: BC Managed Care – PPO | Admitting: *Deleted

## 2020-04-25 ENCOUNTER — Other Ambulatory Visit: Payer: Self-pay

## 2020-04-25 DIAGNOSIS — Z951 Presence of aortocoronary bypass graft: Secondary | ICD-10-CM | POA: Diagnosis not present

## 2020-04-25 DIAGNOSIS — Z794 Long term (current) use of insulin: Secondary | ICD-10-CM | POA: Diagnosis not present

## 2020-04-25 DIAGNOSIS — Z87442 Personal history of urinary calculi: Secondary | ICD-10-CM | POA: Diagnosis not present

## 2020-04-25 DIAGNOSIS — Z79899 Other long term (current) drug therapy: Secondary | ICD-10-CM | POA: Diagnosis not present

## 2020-04-25 DIAGNOSIS — K219 Gastro-esophageal reflux disease without esophagitis: Secondary | ICD-10-CM | POA: Diagnosis not present

## 2020-04-25 DIAGNOSIS — I2510 Atherosclerotic heart disease of native coronary artery without angina pectoris: Secondary | ICD-10-CM | POA: Diagnosis not present

## 2020-04-25 DIAGNOSIS — Z7982 Long term (current) use of aspirin: Secondary | ICD-10-CM | POA: Diagnosis not present

## 2020-04-25 DIAGNOSIS — E785 Hyperlipidemia, unspecified: Secondary | ICD-10-CM | POA: Diagnosis not present

## 2020-04-25 DIAGNOSIS — I252 Old myocardial infarction: Secondary | ICD-10-CM | POA: Diagnosis not present

## 2020-04-25 DIAGNOSIS — E10319 Type 1 diabetes mellitus with unspecified diabetic retinopathy without macular edema: Secondary | ICD-10-CM | POA: Diagnosis not present

## 2020-04-25 DIAGNOSIS — Z87440 Personal history of urinary (tract) infections: Secondary | ICD-10-CM | POA: Diagnosis not present

## 2020-04-25 LAB — GLUCOSE, CAPILLARY
Glucose-Capillary: 111 mg/dL — ABNORMAL HIGH (ref 70–99)
Glucose-Capillary: 112 mg/dL — ABNORMAL HIGH (ref 70–99)

## 2020-04-25 NOTE — Progress Notes (Signed)
Daily Session Note  Patient Details  Name: Felicia [name] MRN: [id number] Date of Birth: February 15, 1961 Referring Provider:     Cardiac Rehab from 04/17/2020 in [facility] Cardiac and Pulmonary Rehab  Referring Provider Melodie Bouillon MD   [facility] Cardiologist: Dr. Arelia Sneddon Croitoru]      Encounter Date: 04/25/2020  Check In:  Session Check In - 04/25/20 0829      Check-In   Supervising physician immediately available to respond to emergencies See telemetry face sheet for immediately available ER MD    Location [facility]-Cardiac & Pulmonary Rehab    Staff Present Heath Lark, RN, BSN, CCRP;Joseph Foy [place], [place], ACSM CEP, Exercise Physiologist    Virtual Visit No    Medication changes reported     No    Fall or balance concerns reported    No    Warm-up and Cool-down Performed on first and last piece of equipment    Resistance Training Performed Yes    VAD Patient? No    PAD/SET Patient? No      Pain Assessment   Currently in Pain? No/denies              Social History   Tobacco Use  Smoking Status Never Smoker  Smokeless Tobacco Never Used    Goals Met:  Independence with exercise equipment Exercise tolerated well No report of cardiac concerns or symptoms  Goals Unmet:  Not Applicable  Comments: Pt able to follow exercise prescription today without complaint.  Will continue to monitor for progression.    Dr. Emily Filbert is Medical Director for [place] and [name].

## 2020-04-27 ENCOUNTER — Other Ambulatory Visit: Payer: Self-pay

## 2020-04-27 ENCOUNTER — Encounter: Payer: BC Managed Care – PPO | Admitting: *Deleted

## 2020-04-27 DIAGNOSIS — E785 Hyperlipidemia, unspecified: Secondary | ICD-10-CM | POA: Diagnosis not present

## 2020-04-27 DIAGNOSIS — Z7982 Long term (current) use of aspirin: Secondary | ICD-10-CM | POA: Diagnosis not present

## 2020-04-27 DIAGNOSIS — Z951 Presence of aortocoronary bypass graft: Secondary | ICD-10-CM | POA: Diagnosis not present

## 2020-04-27 DIAGNOSIS — Z79899 Other long term (current) drug therapy: Secondary | ICD-10-CM | POA: Diagnosis not present

## 2020-04-27 DIAGNOSIS — I252 Old myocardial infarction: Secondary | ICD-10-CM | POA: Diagnosis not present

## 2020-04-27 DIAGNOSIS — K219 Gastro-esophageal reflux disease without esophagitis: Secondary | ICD-10-CM | POA: Diagnosis not present

## 2020-04-27 DIAGNOSIS — E10319 Type 1 diabetes mellitus with unspecified diabetic retinopathy without macular edema: Secondary | ICD-10-CM | POA: Diagnosis not present

## 2020-04-27 DIAGNOSIS — I214 Non-ST elevation (NSTEMI) myocardial infarction: Secondary | ICD-10-CM

## 2020-04-27 DIAGNOSIS — I2510 Atherosclerotic heart disease of native coronary artery without angina pectoris: Secondary | ICD-10-CM | POA: Diagnosis not present

## 2020-04-27 DIAGNOSIS — Z794 Long term (current) use of insulin: Secondary | ICD-10-CM | POA: Diagnosis not present

## 2020-04-27 DIAGNOSIS — Z87442 Personal history of urinary calculi: Secondary | ICD-10-CM | POA: Diagnosis not present

## 2020-04-27 DIAGNOSIS — S52532D Colles' fracture of left radius, subsequent encounter for closed fracture with routine healing: Secondary | ICD-10-CM | POA: Diagnosis not present

## 2020-04-27 DIAGNOSIS — Z87440 Personal history of urinary (tract) infections: Secondary | ICD-10-CM | POA: Diagnosis not present

## 2020-04-27 NOTE — Progress Notes (Signed)
Daily Session Note  Patient Details  Name: Felicia [name] MRN: [id number] Date of Birth: 1961/05/04 Referring Provider:     Cardiac Rehab from 04/17/2020 in [facility] Cardiac and Pulmonary Rehab  Referring Provider Melodie Bouillon MD   [facility] Cardiologist: Dr. Arelia Sneddon Croitoru]      Encounter Date: 04/27/2020  Check In:  Session Check In - 04/27/20 0806      Check-In   Supervising physician immediately available to respond to emergencies See telemetry face sheet for immediately available ER MD    Location [facility]-Cardiac & Pulmonary Rehab    Staff Present Heath Lark, RN, BSN, CCRP;Saretta Dahlem [place], MA, RCEP, CCRP, [place], [place], ACSM CEP, Exercise Physiologist    Virtual Visit No    Medication changes reported     No    Fall or balance concerns reported    No    Warm-up and Cool-down Performed on first and last piece of equipment    Resistance Training Performed Yes    VAD Patient? No    PAD/SET Patient? No      Pain Assessment   Currently in Pain? No/denies              Social History   Tobacco Use  Smoking Status Never Smoker  Smokeless Tobacco Never Used    Goals Met:  Independence with exercise equipment Exercise tolerated well Personal goals reviewed No report of cardiac concerns or symptoms Strength training completed today  Goals Unmet:  Not Applicable  Comments: Pt able to follow exercise prescription today without complaint.  Will continue to monitor for progression.  Reviewed home exercise with pt today.  Pt plans to continue to ride her bike and walk at home for exercise.  She was already at 20 min and will increase to 30 min. Reviewed THR, pulse, RPE, sign and symptoms, pulse oximetery and when to call 911 or MD.  Also discussed weather considerations and indoor options.  Pt voiced understanding.    Dr. Emily Filbert is Medical Director for [place] and [name].

## 2020-05-03 ENCOUNTER — Ambulatory Visit: Payer: BC Managed Care – PPO | Admitting: Cardiovascular Disease

## 2020-05-05 ENCOUNTER — Ambulatory Visit: Payer: BC Managed Care – PPO | Admitting: Internal Medicine

## 2020-05-09 ENCOUNTER — Other Ambulatory Visit: Payer: Self-pay

## 2020-05-09 ENCOUNTER — Encounter: Payer: BC Managed Care – PPO | Attending: Cardiovascular Disease | Admitting: *Deleted

## 2020-05-09 DIAGNOSIS — I2510 Atherosclerotic heart disease of native coronary artery without angina pectoris: Secondary | ICD-10-CM | POA: Insufficient documentation

## 2020-05-09 DIAGNOSIS — Z794 Long term (current) use of insulin: Secondary | ICD-10-CM | POA: Diagnosis not present

## 2020-05-09 DIAGNOSIS — I214 Non-ST elevation (NSTEMI) myocardial infarction: Secondary | ICD-10-CM

## 2020-05-09 DIAGNOSIS — K219 Gastro-esophageal reflux disease without esophagitis: Secondary | ICD-10-CM | POA: Diagnosis not present

## 2020-05-09 DIAGNOSIS — Z87440 Personal history of urinary (tract) infections: Secondary | ICD-10-CM | POA: Insufficient documentation

## 2020-05-09 DIAGNOSIS — Z87442 Personal history of urinary calculi: Secondary | ICD-10-CM | POA: Insufficient documentation

## 2020-05-09 DIAGNOSIS — E10319 Type 1 diabetes mellitus with unspecified diabetic retinopathy without macular edema: Secondary | ICD-10-CM | POA: Diagnosis not present

## 2020-05-09 DIAGNOSIS — Z7982 Long term (current) use of aspirin: Secondary | ICD-10-CM | POA: Diagnosis not present

## 2020-05-09 DIAGNOSIS — E785 Hyperlipidemia, unspecified: Secondary | ICD-10-CM | POA: Insufficient documentation

## 2020-05-09 DIAGNOSIS — Z951 Presence of aortocoronary bypass graft: Secondary | ICD-10-CM | POA: Insufficient documentation

## 2020-05-09 DIAGNOSIS — I252 Old myocardial infarction: Secondary | ICD-10-CM | POA: Insufficient documentation

## 2020-05-09 DIAGNOSIS — Z79899 Other long term (current) drug therapy: Secondary | ICD-10-CM | POA: Diagnosis not present

## 2020-05-09 NOTE — Progress Notes (Signed)
Daily Session Note  Patient Details  Name: Felicia [name] MRN: [id number] Date of Birth: August 09, 1961 Referring Provider:     Cardiac Rehab from 04/17/2020 in [facility] Cardiac and Pulmonary Rehab  Referring Provider Melodie Bouillon MD   [facility] Cardiologist: Dr. Arelia Sneddon Croitoru]      Encounter Date: 05/09/2020  Check In:  Session Check In - 05/09/20 0819      Check-In   Supervising physician immediately available to respond to emergencies See telemetry face sheet for immediately available ER MD    Location [facility]-Cardiac & Pulmonary Rehab    Staff Present Heath Lark, RN, BSN, Jacklynn Bue, MS Exercise Physiologist;Amanda Oletta [place], [place], ACSM CEP, Exercise Physiologist    Virtual Visit No    Medication changes reported     No    Fall or balance concerns reported    No    Warm-up and Cool-down Performed on first and last piece of equipment    Resistance Training Performed Yes    VAD Patient? No    PAD/SET Patient? No      Pain Assessment   Currently in Pain? No/denies              Social History   Tobacco Use  Smoking Status Never Smoker  Smokeless Tobacco Never Used    Goals Met:  Independence with exercise equipment Exercise tolerated well No report of cardiac concerns or symptoms  Goals Unmet:  Not Applicable  Comments: Pt able to follow exercise prescription today without complaint.  Will continue to monitor for progression.    Dr. Emily Filbert is Medical Director for [place] and [name].

## 2020-05-11 ENCOUNTER — Other Ambulatory Visit: Payer: Self-pay

## 2020-05-11 ENCOUNTER — Encounter: Payer: BC Managed Care – PPO | Admitting: *Deleted

## 2020-05-11 DIAGNOSIS — Z951 Presence of aortocoronary bypass graft: Secondary | ICD-10-CM

## 2020-05-11 DIAGNOSIS — K219 Gastro-esophageal reflux disease without esophagitis: Secondary | ICD-10-CM | POA: Diagnosis not present

## 2020-05-11 DIAGNOSIS — Z79899 Other long term (current) drug therapy: Secondary | ICD-10-CM | POA: Diagnosis not present

## 2020-05-11 DIAGNOSIS — I252 Old myocardial infarction: Secondary | ICD-10-CM | POA: Diagnosis not present

## 2020-05-11 DIAGNOSIS — Z87442 Personal history of urinary calculi: Secondary | ICD-10-CM | POA: Diagnosis not present

## 2020-05-11 DIAGNOSIS — I2510 Atherosclerotic heart disease of native coronary artery without angina pectoris: Secondary | ICD-10-CM | POA: Diagnosis not present

## 2020-05-11 DIAGNOSIS — G5602 Carpal tunnel syndrome, left upper limb: Secondary | ICD-10-CM | POA: Diagnosis not present

## 2020-05-11 DIAGNOSIS — Z7982 Long term (current) use of aspirin: Secondary | ICD-10-CM | POA: Diagnosis not present

## 2020-05-11 DIAGNOSIS — E785 Hyperlipidemia, unspecified: Secondary | ICD-10-CM | POA: Diagnosis not present

## 2020-05-11 DIAGNOSIS — Z794 Long term (current) use of insulin: Secondary | ICD-10-CM | POA: Diagnosis not present

## 2020-05-11 DIAGNOSIS — Z87440 Personal history of urinary (tract) infections: Secondary | ICD-10-CM | POA: Diagnosis not present

## 2020-05-11 DIAGNOSIS — E10319 Type 1 diabetes mellitus with unspecified diabetic retinopathy without macular edema: Secondary | ICD-10-CM | POA: Diagnosis not present

## 2020-05-11 NOTE — Progress Notes (Signed)
Daily Session Note  Patient Details  Name: Felicia [name] MRN: [id number] Date of Birth: 04-Dec-1960 Referring Provider:     Cardiac Rehab from 04/17/2020 in [facility] Cardiac and Pulmonary Rehab  Referring Provider Melodie Bouillon MD   [facility] Cardiologist: Dr. Arelia Sneddon Croitoru]      Encounter Date: 05/11/2020  Check In:  Session Check In - 05/11/20 0816      Check-In   Supervising physician immediately available to respond to emergencies See telemetry face sheet for immediately available ER MD    Staff Present Heath Lark, RN, BSN, CCRP;Joseph Foy [place], [place], ACSM CEP, Exercise Physiologist    Virtual Visit No    Medication changes reported     No    Fall or balance concerns reported    No    Warm-up and Cool-down Performed on first and last piece of equipment    Resistance Training Performed Yes    VAD Patient? No    PAD/SET Patient? No      Pain Assessment   Currently in Pain? No/denies              Social History   Tobacco Use  Smoking Status Never Smoker  Smokeless Tobacco Never Used    Goals Met:  Independence with exercise equipment Exercise tolerated well No report of cardiac concerns or symptoms  Goals Unmet:  Not Applicable  Comments: Pt able to follow exercise prescription today without complaint.  Will continue to monitor for progression.    Dr. Emily Filbert is Medical Director for [place] and [name].

## 2020-05-15 ENCOUNTER — Other Ambulatory Visit: Payer: Self-pay | Admitting: Physician Assistant

## 2020-05-16 ENCOUNTER — Encounter: Payer: BC Managed Care – PPO | Admitting: *Deleted

## 2020-05-16 ENCOUNTER — Other Ambulatory Visit: Payer: Self-pay

## 2020-05-16 DIAGNOSIS — Z794 Long term (current) use of insulin: Secondary | ICD-10-CM | POA: Diagnosis not present

## 2020-05-16 DIAGNOSIS — K219 Gastro-esophageal reflux disease without esophagitis: Secondary | ICD-10-CM | POA: Diagnosis not present

## 2020-05-16 DIAGNOSIS — I252 Old myocardial infarction: Secondary | ICD-10-CM | POA: Diagnosis not present

## 2020-05-16 DIAGNOSIS — Z79899 Other long term (current) drug therapy: Secondary | ICD-10-CM | POA: Diagnosis not present

## 2020-05-16 DIAGNOSIS — Z87442 Personal history of urinary calculi: Secondary | ICD-10-CM | POA: Diagnosis not present

## 2020-05-16 DIAGNOSIS — E785 Hyperlipidemia, unspecified: Secondary | ICD-10-CM | POA: Diagnosis not present

## 2020-05-16 DIAGNOSIS — Z87440 Personal history of urinary (tract) infections: Secondary | ICD-10-CM | POA: Diagnosis not present

## 2020-05-16 DIAGNOSIS — Z7982 Long term (current) use of aspirin: Secondary | ICD-10-CM | POA: Diagnosis not present

## 2020-05-16 DIAGNOSIS — E10319 Type 1 diabetes mellitus with unspecified diabetic retinopathy without macular edema: Secondary | ICD-10-CM | POA: Diagnosis not present

## 2020-05-16 DIAGNOSIS — Z951 Presence of aortocoronary bypass graft: Secondary | ICD-10-CM | POA: Diagnosis not present

## 2020-05-16 DIAGNOSIS — I2510 Atherosclerotic heart disease of native coronary artery without angina pectoris: Secondary | ICD-10-CM | POA: Diagnosis not present

## 2020-05-16 NOTE — Progress Notes (Signed)
Daily Session Note  Patient Details  Name: FAYE STROHMAN MRN: [id number] Date of Birth: 1961/04/24 Referring Provider:     Cardiac Rehab from 04/17/2020 in [facility] Cardiac and Pulmonary Rehab  Referring Provider Melodie Bouillon MD   [place] Cardiologist: Dr. Arelia Sneddon Croitoru]      Encounter Date: 05/16/2020  Check In:  Session Check In - 05/16/20 0914      Check-In   Supervising physician immediately available to respond to emergencies See telemetry face sheet for immediately available ER MD    Location [facility]-Cardiac & Pulmonary Rehab    Staff Present Heath Lark, RN, BSN, Jacklynn Bue, MS Exercise Physiologist;Amanda Oletta [place], [place], ACSM CEP, Exercise Physiologist    Virtual Visit No    Medication changes reported     No    Fall or balance concerns reported    No    Warm-up and Cool-down Performed on first and last piece of equipment    Resistance Training Performed Yes    VAD Patient? No    PAD/SET Patient? No      Pain Assessment   Currently in Pain? No/denies              Social History   Tobacco Use  Smoking Status Never Smoker  Smokeless Tobacco Never Used    Goals Met:  Independence with exercise equipment Exercise tolerated well No report of cardiac concerns or symptoms  Goals Unmet:  Not Applicable  Comments: Pt able to follow exercise prescription today without complaint.  Will continue to monitor for progression.    Dr. Emily Filbert is Medical Director for [place] and [name].

## 2020-05-17 ENCOUNTER — Encounter: Payer: Self-pay | Admitting: *Deleted

## 2020-05-17 DIAGNOSIS — Z951 Presence of aortocoronary bypass graft: Secondary | ICD-10-CM

## 2020-05-17 NOTE — Progress Notes (Signed)
Cardiac Individual Treatment Plan  Patient Details  Name: Felicia [name] MRN: [id number] Date of Birth: 22-Aug-1961 Referring Provider:     Cardiac Rehab from 04/17/2020 in [facility] Cardiac and Pulmonary Rehab  Referring Provider Melodie Bouillon MD   [facility] Cardiologist: Dr. Arelia Sneddon Croitoru]      Initial Encounter Date:    Cardiac Rehab from 04/17/2020 in [facility] Cardiac and Pulmonary Rehab  Date 04/17/20      Visit Diagnosis: S/P CABG x 5  Patient's Home Medications on Admission:  Current Outpatient Medications:  .  aspirin EC 325 MG EC tablet, Take 1 tablet (325 mg total) by mouth daily., Disp: 30 tablet, Rfl: 0 .  esomeprazole (NEXIUM) 40 MG capsule, Take 1 capsule (40 mg total) by mouth daily. Patient needs office visit for further refills, Disp: 90 capsule, Rfl: 0 .  Evolocumab (REPATHA SURECLICK) 758 MG/ML SOAJ, Inject 140 mg into the skin every 14 (fourteen) days., Disp: 2 pen, Rfl: 11 .  ezetimibe (ZETIA) 10 MG tablet, Take 1 tablet (10 mg total) by mouth daily., Disp: 30 tablet, Rfl: 2 .  glucagon (GLUCAGON EMERGENCY) 1 MG injection, Inject 1 mg into the muscle once as needed (in case of severe hypoglycemia)., Disp: 1 each, Rfl: 12 .  Insulin Glargine (LANTUS SOLOSTAR) 100 UNIT/ML Solostar Pen, Inject 12 Units into the skin daily. (Patient taking differently: Inject 8-10 Units into the skin See admin instructions. Inject 10 units into the skin in the morning and 8 units in the evening), Disp: 5 pen, Rfl: 11 .  Insulin Pen Needle (NOVOFINE) 32G X 6 MM MISC, USE TO INJECT INSULIN 4 TIMES A DAY, Disp: 200 each, Rfl: 1 .  insulin regular (NOVOLIN R) 100 units/mL injection, INJECT 6 UNITS TOTAL INTO THE SKIN 3 (THREE) TIMES DAILY BEFORE MEALS. (Patient taking differently: Inject 1-10 Units into the skin See admin instructions. For glucose 150-210 add 1 additional unit, per scale 211-270= 2 units, 271-330= 3 units, 331-390= 4 units  For breakfast and dinner, administer 1 unit for every 10  grams of carbs. For snacks and dinner, administer 1 unit for every 20 grams of carbs.), Disp: 60 mL, Rfl: 1 .  Insulin Syringe-Needle U-100 30G X 5/16" 0.3 ML MISC, Use to inject insulin 6 times a day., Disp: 600 each, Rfl: 11 .  Multiple Vitamin (MULTIVITAMIN) tablet, Take 1 tablet by mouth daily.  , Disp: , Rfl:   Past Medical History: Past Medical History:  Diagnosis Date  . Allergy   . Anemia    past history long ago  . Borderline HTN (hypertension)   . CAD (coronary artery disease)    a. 02/2020 Cath: LM nl, LAD large, 90p, 63m D1 90p, LCX nl, OM 60-70p, RCA dominant w/ dampening upon engagement of ostium. 70p/m, RPDA 50. EF 55-65%.  . Disorder of kidney    has abd kidney (right)  . GERD (gastroesophageal reflux disease)   . History of diabetic gastroparesis   . History of echocardiogram    a. 01/2013 Echo: EF 55-65%, no rwma. Mild MR. Nl RV fxn.  .Marland KitchenHLD (hyperlipidemia)   . Hx: UTI (urinary tract infection)   . IBS (irritable bowel syndrome)   . Nephrolithiasis 2005  . Osteopenia 2012   per pt  . Systolic murmur 38/3254  mild mitral insuff  . Type 1 diabetes mellitus with diabetic retinopathy without macular edema (HCC)    dx at 59y/o, background retinopathy    Tobacco Use: Social History  Tobacco Use  Smoking Status Never Smoker  Smokeless Tobacco Never Used    Labs: Recent Review Flowsheet Data    Labs for ITP Cardiac and Pulmonary Rehab Latest Ref Rng & Units 02/21/2020 02/21/2020 02/21/2020 02/21/2020 02/21/2020   Cholestrol 0 - 200 mg/dL - - - - -   LDLCALC 0 - 99 mg/dL - - - - -   LDLDIRECT mg/dL - - - - -   HDL >39.00 mg/dL - - - - -   Trlycerides 0 - 149 mg/dL - - - - -   Hemoglobin A1c 4.8 - 5.6 % - - - - -   PHART 7.35 - 7.45 - 7.485(H) 7.344(L) 7.292(L) 7.314(L)   PCO2ART 32 - 48 mmHg - 28.8(L) 38.7 43.3 46.7   HCO3 20.0 - 28.0 mmol/L - 21.7 21.1 20.7 23.3   TCO2 22 - 32 mmol/L 27 23 22 22 25    ACIDBASEDEF 0.0 - 2.0 mmol/L - 1.0 4.0(H) 5.0(H) 3.0(H)    O2SAT % - 98.0 99.0 88.0 100.0       Exercise Target Goals: Exercise Program Goal: Individual exercise prescription set using results from initial 6 min walk test and THRR while considering  patient's activity barriers and safety.   Exercise Prescription Goal: Initial exercise prescription builds to 30-45 minutes a day of aerobic activity, 2-3 days per week.  Home exercise guidelines will be given to patient during program as part of exercise prescription that the participant will acknowledge.   Education: Aerobic Exercise & Resistance Training: - Gives group verbal and written instruction on the various components of exercise. Focuses on aerobic and resistive training programs and the benefits of this training and how to safely progress through these programs..   Education: Exercise & Equipment Safety: - Individual verbal instruction and demonstration of equipment use and safety with use of the equipment.   Cardiac Rehab from 04/17/2020 in [facility] Cardiac and Pulmonary Rehab  Date 04/17/20  Educator [facility]  Instruction Review Code 1- Verbalizes Understanding      Education: Exercise Physiology & General Exercise Guidelines: - Group verbal and written instruction with models to review the exercise physiology of the cardiovascular system and associated critical values. Provides general exercise guidelines with specific guidelines to those with heart or lung disease.    Education: Flexibility, Balance, Mind/Body Relaxation: Provides group verbal/written instruction on the benefits of flexibility and balance training, including mind/body exercise modes such as yoga, pilates and tai chi.  Demonstration and skill practice provided.   Activity Barriers & Risk Stratification:  Activity Barriers & Cardiac Risk Stratification - 04/17/20 1031      Activity Barriers & Cardiac Risk Stratification   Activity Barriers Deconditioning;Incisional Pain;Muscular Weakness;Joint Problems;Balance  Concerns;Other (comment);History of Falls;Back Problems;Neck/Spine Problems    Comments no heavy weights for 3 more weeks    Cardiac Risk Stratification Moderate           6 Minute Walk:  6 Minute Walk    Row Name 04/17/20 1030         6 Minute Walk   Phase Initial     Distance 940 feet     Walk Time 6 minutes     # of Rest Breaks 0     MPH 1.78     METS 3.65     RPE 13     VO2 Peak 12.77     Symptoms No     Resting HR 92 bpm     Resting BP 118/64  Resting Oxygen Saturation  99 %     Exercise Oxygen Saturation  during 6 min walk 99 %     Max Ex. HR 106 bpm     Max Ex. BP 186/82     2 Minute Post BP 154/82            Oxygen Initial Assessment:   Oxygen Re-Evaluation:   Oxygen Discharge (Final Oxygen Re-Evaluation):   Initial Exercise Prescription:  Initial Exercise Prescription - 04/17/20 1000      Date of Initial Exercise RX and Referring Provider   Date 04/17/20    Referring Provider Melodie Bouillon MD    Primary Cardiologist: Dr. Arelia Sneddon Croitoru     Treadmill   MPH 1.7    Grade 0    Minutes 15    METs 2.3      REL-XR   Level 2    Speed 50    Minutes 15    METs 2.5      T5 Nustep   Level 3    SPM 80    Minutes 15    METs 2.5      Prescription Details   Frequency (times per week) 2    Duration Progress to 30 minutes of continuous aerobic without signs/symptoms of physical distress      Intensity   THRR 40-80% of Max Heartrate 120-149    Ratings of Perceived Exertion 11-13    Perceived Dyspnea 0-4      Progression   Progression Continue to progress workloads to maintain intensity without signs/symptoms of physical distress.      Resistance Training   Training Prescription Yes    Weight 3lbs (right), 1 lb (left)    Reps 10-15           Perform Capillary Blood Glucose checks as needed.  Exercise Prescription Changes:  Exercise Prescription Changes    Row Name 04/17/20 1000 04/19/20 1200 05/02/20 1500 05/16/20 1400         Response to Exercise   Blood Pressure (Admit) 118/64 114/58 138/64 138/68    Blood Pressure (Exercise) 186/82  rck 154/82 128/58 144/62 128/56    Blood Pressure (Exit) 124/72 112/56 122/64 114/64    Heart Rate (Admit) 92 bpm 93 bpm 91 bpm 94 bpm    Heart Rate (Exercise) 106 bpm 108 bpm 113 bpm 110 bpm    Heart Rate (Exit) 97 bpm 94 bpm 91 bpm 86 bpm    Oxygen Saturation (Admit) 99 % -- -- --    Oxygen Saturation (Exercise) 99 % -- -- --    Rating of Perceived Exertion (Exercise) 13 11 11 11     Symptoms none none none none    Comments walk test results -- -- --    Duration -- Continue with 30 min of aerobic exercise without signs/symptoms of physical distress. Continue with 30 min of aerobic exercise without signs/symptoms of physical distress. Continue with 30 min of aerobic exercise without signs/symptoms of physical distress.    Intensity -- THRR unchanged THRR unchanged THRR unchanged      Progression   Progression -- -- Continue to progress workloads to maintain intensity without signs/symptoms of physical distress. Continue to progress workloads to maintain intensity without signs/symptoms of physical distress.    Average METs -- 3.3 3.9 4.5      Resistance Training   Training Prescription -- Yes Yes Yes    Weight -- 3lbs (right), 1 lb (left) 3lbs (right), 1 lb (left) 3lbs (right), 1  lb (left)    Reps -- 10-15 10-15 10-15      Interval Training   Interval Training -- -- No No      Treadmill   MPH -- 1.7 2.1 2.4    Grade -- 0 1 1.5    Minutes -- 15 15 15     METs -- 2.3 2.9 3.33      REL-XR   Level -- 2 2 3     Speed -- 50 -- 50    Minutes -- 15 15 15     METs -- 4.3 6.3 5.7      T5 Nustep   Level -- -- 3 --    Minutes -- -- 15 --    METs -- -- 2.5 --      Home Exercise Plan   Plans to continue exercise at -- -- Home (comment)  walking, biking Home (comment)  walking, biking    Frequency -- -- Add 2 additional days to program exercise sessions. Add 2 additional days  to program exercise sessions.    Initial Home Exercises Provided -- -- 04/27/20 04/27/20           Exercise Comments:  Exercise Comments    Row Name 04/18/20 0949           Exercise Comments First full day of exercise!  Patient was oriented to gym and equipment including functions, settings, policies, and procedures.  Patient's individual exercise prescription and treatment plan were reviewed.  All starting workloads were established based on the results of the 6 minute walk test done at initial orientation visit.  The plan for exercise progression was also introduced and progression will be customized based on patient's performance and goals.              Exercise Goals and Review:  Exercise Goals    Row Name 04/17/20 1036             Exercise Goals   Increase Physical Activity Yes       Intervention Provide advice, education, support and counseling about physical activity/exercise needs.;Develop an individualized exercise prescription for aerobic and resistive training based on initial evaluation findings, risk stratification, comorbidities and participant's personal goals.       Expected Outcomes Short Term: Attend rehab on a regular basis to increase amount of physical activity.;Long Term: Add in home exercise to make exercise part of routine and to increase amount of physical activity.;Long Term: Exercising regularly at least 3-5 days a week.       Increase Strength and Stamina Yes       Intervention Provide advice, education, support and counseling about physical activity/exercise needs.;Develop an individualized exercise prescription for aerobic and resistive training based on initial evaluation findings, risk stratification, comorbidities and participant's personal goals.       Expected Outcomes Short Term: Increase workloads from initial exercise prescription for resistance, speed, and METs.;Short Term: Perform resistance training exercises routinely during rehab and add in  resistance training at home;Long Term: Improve cardiorespiratory fitness, muscular endurance and strength as measured by increased METs and functional capacity (6MWT)       Able to understand and use rate of perceived exertion (RPE) scale Yes       Intervention Provide education and explanation on how to use RPE scale       Expected Outcomes Long Term:  Able to use RPE to guide intensity level when exercising independently;Short Term: Able to use RPE daily in rehab to express subjective intensity  level       Able to understand and use Dyspnea scale Yes       Intervention Provide education and explanation on how to use Dyspnea scale       Expected Outcomes Long Term: Able to use Dyspnea scale to guide intensity level when exercising independently;Short Term: Able to use Dyspnea scale daily in rehab to express subjective sense of shortness of breath during exertion       Knowledge and understanding of Target Heart Rate Range (THRR) Yes       Intervention Provide education and explanation of THRR including how the numbers were predicted and where they are located for reference       Expected Outcomes Short Term: Able to state/look up THRR;Short Term: Able to use daily as guideline for intensity in rehab;Long Term: Able to use THRR to govern intensity when exercising independently       Able to check pulse independently Yes       Intervention Provide education and demonstration on how to check pulse in carotid and radial arteries.;Review the importance of being able to check your own pulse for safety during independent exercise       Expected Outcomes Short Term: Able to explain why pulse checking is important during independent exercise;Long Term: Able to check pulse independently and accurately       Understanding of Exercise Prescription Yes       Intervention Provide education, explanation, and written materials on patient's individual exercise prescription       Expected Outcomes Short Term: Able to  explain program exercise prescription;Long Term: Able to explain home exercise prescription to exercise independently              Exercise Goals Re-Evaluation :  Exercise Goals Re-Evaluation    Row Name 04/18/20 0949 04/27/20 0808 05/02/20 1534 05/16/20 1426       Exercise Goal Re-Evaluation   Exercise Goals Review Able to understand and use rate of perceived exertion (RPE) scale;Knowledge and understanding of Target Heart Rate Range (THRR);Understanding of Exercise Prescription Increase Physical Activity;Increase Strength and Stamina;Understanding of Exercise Prescription Increase Physical Activity;Increase Strength and Stamina;Understanding of Exercise Prescription Increase Physical Activity;Increase Strength and Stamina;Understanding of Exercise Prescription    Comments Reviewed RPE and dyspnea scales, THR and program prescription with pt today.  Pt voiced understanding and was given a copy of goals to take home. Jamesha is off to a good start in rehab.  Her strength and stamina are recovering and she is no longer as tired.  She is already riding her bike and walking at home.  Reviewed home exercise with pt today.  Pt plans to continue to ride her bike and walk at home for exercise.  She was already at 20 min and will increase to 30 min. Reviewed THR, pulse, RPE, sign and symptoms, pulse oximetery and when to call 911 or MD.  Also discussed weather considerations and indoor options.  Pt voiced understanding. Merriel is off on vacation this week, walking on the beach!  She  is up to 6.3 METs on the XR.  We will continue to monitor her progress. Kally continues to increase workloads on machines.  She continues to have some issues with her wrist but is seeing OT.    Expected Outcomes Short: Use RPE daily to regulate intensity. Long: Follow program prescription in THR. Short: Increase time at home to 30 min Long: Continue to improve strength and stamina. Short: Try level 3 on XR and  walk on vacation Long:  Continue to improve stamina. Short: continue to attend consistently Long: increase MET levels           Discharge Exercise Prescription (Final Exercise Prescription Changes):  Exercise Prescription Changes - 05/16/20 1400      Response to Exercise   Blood Pressure (Admit) 138/68    Blood Pressure (Exercise) 128/56    Blood Pressure (Exit) 114/64    Heart Rate (Admit) 94 bpm    Heart Rate (Exercise) 110 bpm    Heart Rate (Exit) 86 bpm    Rating of Perceived Exertion (Exercise) 11    Symptoms none    Duration Continue with 30 min of aerobic exercise without signs/symptoms of physical distress.    Intensity THRR unchanged      Progression   Progression Continue to progress workloads to maintain intensity without signs/symptoms of physical distress.    Average METs 4.5      Resistance Training   Training Prescription Yes    Weight 3lbs (right), 1 lb (left)    Reps 10-15      Interval Training   Interval Training No      Treadmill   MPH 2.4    Grade 1.5    Minutes 15    METs 3.33      REL-XR   Level 3    Speed 50    Minutes 15    METs 5.7      Home Exercise Plan   Plans to continue exercise at Home (comment)   walking, biking   Frequency Add 2 additional days to program exercise sessions.    Initial Home Exercises Provided 04/27/20           Nutrition:  Target Goals: Understanding of nutrition guidelines, daily intake of sodium <1574m, cholesterol <2057m calories 30% from fat and 7% or less from saturated fats, daily to have 5 or more servings of fruits and vegetables.  Education: Controlling Sodium/Reading Food Labels -Group verbal and written material supporting the discussion of sodium use in heart healthy nutrition. Review and explanation with models, verbal and written materials for utilization of the food label.   Education: General Nutrition Guidelines/Fats and Fiber: -Group instruction provided by verbal, written material, models and posters to  present the general guidelines for heart healthy nutrition. Gives an explanation and review of dietary fats and fiber.   Biometrics:  Pre Biometrics - 04/17/20 1037      Pre Biometrics   Height 5' 3.75" (1.619 m)    Weight 132 lb 11.2 oz (60.2 kg)    BMI (Calculated) 22.96    Single Leg Stand 1.66 seconds            Nutrition Therapy Plan and Nutrition Goals:   Nutrition Assessments:  Nutrition Assessments - 04/11/20 1121      MEDFICTS Scores   Pre Score 3           MEDIFICTS Score Key:          ?70 Need to make dietary changes          40-70 Heart Healthy Diet         ? 40 Therapeutic Level Cholesterol Diet  Nutrition Goals Re-Evaluation:  Nutrition Goals Re-Evaluation    [place] 04/27/20 0816             Goals   Comment Will meet with dietician once both back from vacation.  Nutrition Goals Discharge (Final Nutrition Goals Re-Evaluation):  Nutrition Goals Re-Evaluation - 04/27/20 0816      Goals   Comment Will meet with dietician once both back from vacation.           Psychosocial: Target Goals: Acknowledge presence or absence of significant depression and/or stress, maximize coping skills, provide positive support system. Participant is able to verbalize types and ability to use techniques and skills needed for reducing stress and depression.   Education: Depression - Provides group verbal and written instruction on the correlation between heart/lung disease and depressed mood, treatment options, and the stigmas associated with seeking treatment.   Education: Sleep Hygiene -Provides group verbal and written instruction about how sleep can affect your health.  Define sleep hygiene, discuss sleep cycles and impact of sleep habits. Review good sleep hygiene tips.     Education: Stress and Anxiety: - Provides group verbal and written instruction about the health risks of elevated stress and causes of high stress.  Discuss the  correlation between heart/lung disease and anxiety and treatment options. Review healthy ways to manage with stress and anxiety.    Initial Review & Psychosocial Screening:  Initial Psych Review & Screening - 04/10/20 1414      Initial Review   Current issues with Current Stress Concerns;Current Sleep Concerns    Source of Stress Concerns Unable to participate in former interests or hobbies;Unable to perform yard/household activities;Occupation    Comments Averages about 4-5 hours of sleep each night post surgery, eager to return to work      [place]? Yes   husband, daughter, mother, and granddaughter   Concerns No support system      Barriers   Psychosocial barriers to participate in program The patient should benefit from training in stress management and relaxation.;Psychosocial barriers identified (see note)      Screening Interventions   Interventions Encouraged to exercise;To provide support and resources with identified psychosocial needs;Provide feedback about the scores to participant    Expected Outcomes Short Term goal: Utilizing psychosocial counselor, staff and physician to assist with identification of specific Stressors or current issues interfering with healing process. Setting desired goal for each stressor or current issue identified.;Long Term Goal: Stressors or current issues are controlled or eliminated.;Short Term goal: Identification and review with participant of any Quality of Life or Depression concerns found by scoring the questionnaire.;Long Term goal: The participant improves quality of Life and PHQ9 Scores as seen by post scores and/or verbalization of changes           Quality of Life Scores:   Quality of Life - 04/11/20 1120      Quality of Life   Select Quality of Life      Quality of Life Scores   Health/Function Pre 22.1 %    Socioeconomic Pre 15.68 %    Psych/Spiritual Pre 26.78 %    Family Pre 10.8 %    GLOBAL  Pre 19.95 %          Scores of 19 and below usually indicate a poorer quality of life in these areas.  A difference of  2-3 points is a clinically meaningful difference.  A difference of 2-3 points in the total score of the Quality of Life Index has been associated with significant improvement in overall quality of life, self-image, physical symptoms, and general health in studies assessing change in quality of life.  PHQ-9: Recent Review Flowsheet Data  Depression screen [facility] 2/9 04/17/2020 02/07/2020   Decreased Interest 0 0   Down, Depressed, Hopeless 0 0   PHQ - 2 Score 0 0   Altered sleeping 1 2   Tired, decreased energy 0 2   Change in appetite 0 0   Feeling bad or failure about yourself  0 0   Trouble concentrating 0 2   Moving slowly or fidgety/restless 0 0   Suicidal thoughts 0 0   PHQ-9 Score 1 6   Difficult doing work/chores Not difficult at all -     Interpretation of Total Score  Total Score Depression Severity:  1-4 = Minimal depression, 5-9 = Mild depression, 10-14 = Moderate depression, 15-19 = Moderately severe depression, 20-27 = Severe depression   Psychosocial Evaluation and Intervention:  Psychosocial Evaluation - 04/27/20 0813      Psychosocial Evaluation & Interventions   Interventions Encouraged to exercise with the program and follow exercise prescription;Stress management education    Comments Averages about 4-5 hours of sleep each night post surgery, eager to return to work.  Should benefit from exercise.    Expected Outcomes Exercise an dimprove sleep    Continue Psychosocial Services  Follow up required by staff           Psychosocial Re-Evaluation:  Psychosocial Re-Evaluation    [place] Name 04/27/20 [phone]             Psychosocial Re-Evaluation   Current issues with Current Sleep Concerns       Comments Kemonie is doing well mentally.  Her sleep continues to be her biggest issue as it is very sporadic and broken.  She usually gets about 4-5  hours.  She is headed out for vacation next week to [place] to get away for a while.       Expected Outcomes Short: Enjoy vacation Long: Continue to work on sleep.       Interventions Encouraged to attend Cardiac Rehabilitation for the exercise;Relaxation education       Continue Psychosocial Services  Follow up required by staff              Psychosocial Discharge (Final Psychosocial Re-Evaluation):  Psychosocial Re-Evaluation - 04/27/20 0814      Psychosocial Re-Evaluation   Current issues with Current Sleep Concerns    Comments Kristena is doing well mentally.  Her sleep continues to be her biggest issue as it is very sporadic and broken.  She usually gets about 4-5 hours.  She is headed out for vacation next week to [place] to get away for a while.    Expected Outcomes Short: Enjoy vacation Long: Continue to work on sleep.    Interventions Encouraged to attend Cardiac Rehabilitation for the exercise;Relaxation education    Continue Psychosocial Services  Follow up required by staff           Vocational Rehabilitation: Provide vocational rehab assistance to qualifying candidates.   Vocational Rehab Evaluation & Intervention:  Vocational Rehab - 04/10/20 1412      Initial Vocational Rehab Evaluation & Intervention   Assessment shows need for Vocational Rehabilitation No           Education: Education Goals: Education classes will be provided on a variety of topics geared toward better understanding of heart health and risk factor modification. Participant will state understanding/return demonstration of topics presented as noted by education test scores.  Learning Barriers/Preferences:  Learning Barriers/Preferences - 04/10/20 1412      Learning Barriers/Preferences  Learning Barriers Sight   glasses for reading   Learning Preferences Skilled Demonstration           General Cardiac Education Topics:  AED/CPR: - Group verbal and written instruction with the  use of models to demonstrate the basic use of the AED with the basic ABC's of resuscitation.   Anatomy & Physiology of the Heart: - Group verbal and written instruction and models provide basic cardiac anatomy and physiology, with the coronary electrical and arterial systems. Review of Valvular disease and Heart Failure   Cardiac Procedures: - Group verbal and written instruction to review commonly prescribed medications for heart disease. Reviews the medication, class of the drug, and side effects. Includes the steps to properly store meds and maintain the prescription regimen. (beta blockers and nitrates)   Cardiac Medications I: - Group verbal and written instruction to review commonly prescribed medications for heart disease. Reviews the medication, class of the drug, and side effects. Includes the steps to properly store meds and maintain the prescription regimen.   Cardiac Medications II: -Group verbal and written instruction to review commonly prescribed medications for heart disease. Reviews the medication, class of the drug, and side effects. (all other drug classes)    Go Sex-Intimacy & Heart Disease, Get SMART - Goal Setting: - Group verbal and written instruction through game format to discuss heart disease and the return to sexual intimacy. Provides group verbal and written material to discuss and apply goal setting through the application of the S.M.A.R.T. Method.   Other Matters of the Heart: - Provides group verbal, written materials and models to describe Stable Angina and Peripheral Artery. Includes description of the disease process and treatment options available to the cardiac patient.   Infection Prevention: - Provides verbal and written material to individual with discussion of infection control including proper hand washing and proper equipment cleaning during exercise session.   Cardiac Rehab from 04/17/2020 in [facility] Cardiac and Pulmonary Rehab  Date 04/17/20   Educator [facility]  Instruction Review Code 1- Verbalizes Understanding      Falls Prevention: - Provides verbal and written material to individual with discussion of falls prevention and safety.   Cardiac Rehab from 04/17/2020 in [facility] Cardiac and Pulmonary Rehab  Date 04/17/20  Educator [facility]  Instruction Review Code 1- Verbalizes Understanding      Other: -Provides group and verbal instruction on various topics (see comments)   Knowledge Questionnaire Score:  Knowledge Questionnaire Score - 04/11/20 1121      Knowledge Questionnaire Score   Pre Score 23/26 Education Focus: Angina, Nutrition, Exercise           Core Components/Risk Factors/Patient Goals at Admission:  Personal Goals and Risk Factors at Admission - 04/17/20 1037      Core Components/Risk Factors/Patient Goals on Admission    Weight Management Yes;Weight Maintenance    Intervention Weight Management: Develop a combined nutrition and exercise program designed to reach desired caloric intake, while maintaining appropriate intake of nutrient and fiber, sodium and fats, and appropriate energy expenditure required for the weight goal.;Weight Management: Provide education and appropriate resources to help participant work on and attain dietary goals.    Admit Weight 132 lb 11.2 oz (60.2 kg)    Goal Weight: Short Term 130 lb (59 kg)    Goal Weight: Long Term 130 lb (59 kg)    Expected Outcomes Short Term: Continue to assess and modify interventions until short term weight is achieved;Long Term: Adherence to nutrition and physical  activity/exercise program aimed toward attainment of established weight goal;Weight Maintenance: Understanding of the daily nutrition guidelines, which includes 25-35% calories from fat, 7% or less cal from saturated fats, less than 275m cholesterol, less than 1.5gm of sodium, & 5 or more servings of fruits and vegetables daily    Diabetes Yes    Intervention Provide education about signs/symptoms and  action to take for hypo/hyperglycemia.;Provide education about proper nutrition, including hydration, and aerobic/resistive exercise prescription along with prescribed medications to achieve blood glucose in normal ranges: Fasting glucose 65-99 mg/dL    Expected Outcomes Short Term: Participant verbalizes understanding of the signs/symptoms and immediate care of hyper/hypoglycemia, proper foot care and importance of medication, aerobic/resistive exercise and nutrition plan for blood glucose control.;Long Term: Attainment of HbA1C < 7%.    Hypertension Yes    Intervention Provide education on lifestyle modifcations including regular physical activity/exercise, weight management, moderate sodium restriction and increased consumption of fresh fruit, vegetables, and low fat dairy, alcohol moderation, and smoking cessation.;Monitor prescription use compliance.    Expected Outcomes Short Term: Continued assessment and intervention until BP is < 140/982mHG in hypertensive participants. < 130/8064mG in hypertensive participants with diabetes, heart failure or chronic kidney disease.;Long Term: Maintenance of blood pressure at goal levels.    Lipids Yes    Intervention Provide education and support for participant on nutrition & aerobic/resistive exercise along with prescribed medications to achieve LDL <22m35mDL >40mg85m Expected Outcomes Short Term: Participant states understanding of desired cholesterol values and is compliant with medications prescribed. Participant is following exercise prescription and nutrition guidelines.;Long Term: Cholesterol controlled with medications as prescribed, with individualized exercise RX and with personalized nutrition plan. Value goals: LDL < 22mg,2m > 40 mg.           Education:Diabetes - Individual verbal and written instruction to review signs/symptoms of diabetes, desired ranges of glucose level fasting, after meals and with exercise. Acknowledge that pre and  post exercise glucose checks will be done for 3 sessions at entry of program.   [facility]  Date 04/10/20  [facility] Code 1- Verbalizes Understanding      Education: Know Your Numbers and Risk Factors: -Group verbal and written instruction about important numbers in your health.  Discussion of what are risk factors and how they play a role in the disease process.  Review of Cholesterol, Blood Pressure, Diabetes, and BMI and the role they play in your overall health.   Core Components/Risk Factors/Patient Goals Review:   Goals and Risk Factor Review    Row Name 04/27/20 0816             Core Components/Risk Factors/Patient Goals Review   Personal Goals Review Weight Management/Obesity;Diabetes;Hypertension       Review Shaquela Kasiing well in rehab.  Her weight has been steady. Her blood sugars have been great and she checks them 5-6x a day. She is waiting for her hand to heal to be able use her new machine. She checks her blood pressures daily and has noticed that it is low first thing in the morning.  She was encouraged to message her doctor about it.       Expected Outcomes Short: Continue to monitor pressures and sugars Long: Continue to montior risk factors.              Core Components/Risk Factors/Patient Goals at Discharge (Final Review):   Goals  and Risk Factor Review - 04/27/20 0816      Core Components/Risk Factors/Patient Goals Review   Personal Goals Review Weight Management/Obesity;Diabetes;Hypertension    Review Iysis is doing well in rehab.  Her weight has been steady. Her blood sugars have been great and she checks them 5-6x a day. She is waiting for her hand to heal to be able use her new machine. She checks her blood pressures daily and has noticed that it is low first thing in the morning.  She was encouraged to message her doctor about it.    Expected Outcomes Short: Continue to monitor  pressures and sugars Long: Continue to montior risk factors.           ITP Comments:  ITP Comments    Row Name 04/10/20 1420 04/17/20 1030 04/18/20 0949 04/19/20 0641 05/17/20 1105   ITP Comments Completed virtual orientation today.  EP evaluation is scheduled for Mon 6/14 at 930.  Documentation for diagnosis can be found in [facility] encounter 4/2, 4/19, 4/25. Completed 6MWT and gym orientation.  Initial ITP created and sent for review to Dr. Emily Filbert, Medical Director. First full day of exercise!  Patient was oriented to gym and equipment including functions, settings, policies, and procedures.  Patient's individual exercise prescription and treatment plan were reviewed.  All starting workloads were established based on the results of the 6 minute walk test done at initial orientation visit.  The plan for exercise progression was also introduced and progression will be customized based on patient's performance and goals. 30 Day review completed. Medical Director ITP review done, changes made as directed, and signed approval by Medical Director. 30 Day review completed. Medical Director ITP review done, changes made as directed, and signed approval by Medical Director.          Comments:

## 2020-05-18 ENCOUNTER — Other Ambulatory Visit: Payer: Self-pay

## 2020-05-18 ENCOUNTER — Encounter: Payer: BC Managed Care – PPO | Admitting: *Deleted

## 2020-05-18 DIAGNOSIS — Z79899 Other long term (current) drug therapy: Secondary | ICD-10-CM | POA: Diagnosis not present

## 2020-05-18 DIAGNOSIS — Z951 Presence of aortocoronary bypass graft: Secondary | ICD-10-CM | POA: Diagnosis not present

## 2020-05-18 DIAGNOSIS — Z87440 Personal history of urinary (tract) infections: Secondary | ICD-10-CM | POA: Diagnosis not present

## 2020-05-18 DIAGNOSIS — I2510 Atherosclerotic heart disease of native coronary artery without angina pectoris: Secondary | ICD-10-CM | POA: Diagnosis not present

## 2020-05-18 DIAGNOSIS — I252 Old myocardial infarction: Secondary | ICD-10-CM | POA: Diagnosis not present

## 2020-05-18 DIAGNOSIS — Z7982 Long term (current) use of aspirin: Secondary | ICD-10-CM | POA: Diagnosis not present

## 2020-05-18 DIAGNOSIS — I214 Non-ST elevation (NSTEMI) myocardial infarction: Secondary | ICD-10-CM

## 2020-05-18 DIAGNOSIS — Z87442 Personal history of urinary calculi: Secondary | ICD-10-CM | POA: Diagnosis not present

## 2020-05-18 DIAGNOSIS — K219 Gastro-esophageal reflux disease without esophagitis: Secondary | ICD-10-CM | POA: Diagnosis not present

## 2020-05-18 DIAGNOSIS — E10319 Type 1 diabetes mellitus with unspecified diabetic retinopathy without macular edema: Secondary | ICD-10-CM | POA: Diagnosis not present

## 2020-05-18 DIAGNOSIS — Z794 Long term (current) use of insulin: Secondary | ICD-10-CM | POA: Diagnosis not present

## 2020-05-18 DIAGNOSIS — E785 Hyperlipidemia, unspecified: Secondary | ICD-10-CM | POA: Diagnosis not present

## 2020-05-18 NOTE — Progress Notes (Signed)
Daily Session Note  Patient Details  Name: Felicia [name] MRN: [id number] Date of Birth: 1961-05-31 Referring Provider:     Cardiac Rehab from 04/17/2020 in [facility] Cardiac and Pulmonary Rehab  Referring Provider Melodie Bouillon MD   [facility] Cardiologist: Dr. Arelia Sneddon Croitoru]      Encounter Date: 05/18/2020  Check In:  Session Check In - 05/18/20 0748      Check-In   Supervising physician immediately available to respond to emergencies See telemetry face sheet for immediately available ER MD    Location [facility]-Cardiac & Pulmonary Rehab    Staff Present Renita Papa, RN BSN;Melissa Caiola RDN, Rowe Pavy, BA, ACSM CEP, Exercise Physiologist    Virtual Visit No    Medication changes reported     No    Fall or balance concerns reported    No    Warm-up and Cool-down Performed on first and last piece of equipment    Resistance Training Performed Yes    VAD Patient? No    PAD/SET Patient? No      Pain Assessment   Currently in Pain? No/denies              Social History   Tobacco Use  Smoking Status Never Smoker  Smokeless Tobacco Never Used    Goals Met:  Independence with exercise equipment Exercise tolerated well No report of cardiac concerns or symptoms Strength training completed today  Goals Unmet:  Not Applicable  Comments: Pt able to follow exercise prescription today without complaint.  Will continue to monitor for progression.    Dr. Emily Filbert is Medical Director for [place] [name].

## 2020-05-19 ENCOUNTER — Other Ambulatory Visit: Payer: Self-pay

## 2020-05-19 ENCOUNTER — Encounter: Payer: Self-pay | Admitting: Cardiovascular Disease

## 2020-05-19 ENCOUNTER — Ambulatory Visit (INDEPENDENT_AMBULATORY_CARE_PROVIDER_SITE_OTHER): Payer: BC Managed Care – PPO | Admitting: Cardiovascular Disease

## 2020-05-19 VITALS — BP 128/78 | HR 84 | Ht 64.0 in | Wt 131.2 lb

## 2020-05-19 DIAGNOSIS — I25810 Atherosclerosis of coronary artery bypass graft(s) without angina pectoris: Secondary | ICD-10-CM | POA: Diagnosis not present

## 2020-05-19 DIAGNOSIS — IMO0002 Reserved for concepts with insufficient information to code with codable children: Secondary | ICD-10-CM

## 2020-05-19 DIAGNOSIS — E1059 Type 1 diabetes mellitus with other circulatory complications: Secondary | ICD-10-CM

## 2020-05-19 DIAGNOSIS — E785 Hyperlipidemia, unspecified: Secondary | ICD-10-CM | POA: Diagnosis not present

## 2020-05-19 DIAGNOSIS — E1069 Type 1 diabetes mellitus with other specified complication: Secondary | ICD-10-CM

## 2020-05-19 DIAGNOSIS — E1065 Type 1 diabetes mellitus with hyperglycemia: Secondary | ICD-10-CM

## 2020-05-19 MED ORDER — ASPIRIN 81 MG PO TBEC: 81.0000 mg | DELAYED_RELEASE_TABLET | Freq: Every day | ORAL | Status: AC

## 2020-05-19 NOTE — Progress Notes (Signed)
Cardiology Office Note:    Date:  05/21/2020   ID:  CHANNELL QUATTRONE, DOB [birth year], MRN [id number]  PCP:  [name], MD  [facility] Cardiologist:  Thurmon Fair, MD  [facility] Electrophysiologist:  None   Referring MD: [name], MD   Chief Complaint  Patient presents with  . Coronary Artery Disease    History of Present Illness:    Felicia [name] is a 59 y.o. female with a hx of CAD s/p CABG April 2021, with early vein graft occlusion but patent LIMA to LAD, type 1 diabetes mellitus on insulin, hypercholesterolemia intolerant to statins on PCSK9 inhibitor.  Early postoperative course was complicated by an episode of syncope with fall and R orbital fracture and left wrist fracture and coronary/graft angiography showed that she had lost patency of all the SVGs.  The LIMA to LAD bypass was patent.  No intervention was performed.  Echocardiogram showed that left ventricular wall motion was abnormal, significant for moderate hypokinesis of the apical segments of the anterior and anterolateral walls, but overall LVEF was normal at 55 to 60%.  Diagnostic Dominance: Right     Despite those early difficulties, she has done well.  After some delay related to her injuries she began cardiac rehab and has been going for over a month now.  In addition to cardiac rehab she is walking on a daily basis.  The vertigo has resolved.  Her right wrist fracture has been surgically repaired, but she has symptoms of carpal tunnel in the right wrist.  She still having some musculoskeletal chest pain but this is improving.  She has not had any exertional angina and denies dyspnea at rest or with activity.  She has not been troubled palpitations and has not had any recurrent syncope or dizziness.  Her glucose control is greatly improved and her fasting blood sugar readings are less than 150 consistently.  She started treatment with Repatha just 1 month ago and has not yet had a repeat  lipid profile.  Her blood pressure continues to tend to trend quite low and the reading today of 120/74 is unusually high for her.   Past Medical History:  Diagnosis Date  . Allergy   . Anemia    past history long ago  . Borderline HTN (hypertension)   . CAD (coronary artery disease)    a. 02/2020 Cath: LM nl, LAD large, 90p, 34m, D1 90p, LCX nl, OM 60-70p, RCA dominant w/ dampening upon engagement of ostium. 70p/m, RPDA 50. EF 55-65%.  . Disorder of kidney    has abd kidney (right)  . GERD (gastroesophageal reflux disease)   . History of diabetic gastroparesis   . History of echocardiogram    a. 01/2013 Echo: EF 55-65%, no rwma. Mild MR. Nl RV fxn.  Marland Kitchen HLD (hyperlipidemia)   . Hx: UTI (urinary tract infection)   . IBS (irritable bowel syndrome)   . Nephrolithiasis 2005  . Osteopenia 2012   per pt  . Systolic murmur 01/2013   mild mitral insuff  . Type 1 diabetes mellitus with diabetic retinopathy without macular edema (HCC)    dx at 59 y/o, background retinopathy    Past Surgical History:  Procedure Laterality Date  . CESAREAN SECTION  1985  . COLONOSCOPY  07/2016   WNL (Pyrtle)  . CORONARY ARTERY BYPASS GRAFT N/A 02/21/2020   Procedure: CORONARY ARTERY BYPASS GRAFTING (CABG) times five using left internal mammary and right leg saphenous vein;  Surgeon: Brynda Greathouse  O, MD;  Location: [facility] OR;  Service: Open Heart Surgery;  Laterality: N/A;  . ESOPHAGOGASTRODUODENOSCOPY  07/2016   LA Grade C reflux esophagitis, concern for stasis/dysmotility (Pyrtle)  . LAPAROSCOPIC APPENDECTOMY N/A 09/01/2015   Procedure: APPENDECTOMY LAPAROSCOPIC;  Surgeon: Jimmye Norman, MD;  Location: [facility] OR;  Service: General;  Laterality: N/A;  . LEFT HEART CATH AND CORONARY ANGIOGRAPHY N/A 02/17/2020   Procedure: LEFT HEART CATH AND CORONARY ANGIOGRAPHY;  Surgeon: Antonieta Iba, MD;  Location: [facility] INVASIVE CV LAB;  Service: Cardiovascular;  Laterality: N/A;  . LEFT HEART CATH AND CORS/GRAFTS  ANGIOGRAPHY N/A 02/29/2020   Procedure: LEFT HEART CATH AND CORS/GRAFTS ANGIOGRAPHY;  Surgeon: [name], MD;  Location: [facility] INVASIVE CV LAB;  Service: Cardiovascular;  Laterality: N/A;  . ORIF FEMUR FRACTURE Left 1996   Corrected by patient in history 2016  . PARTIAL HYSTERECTOMY  2000   paps by [facility] [place]  . TEE WITHOUT CARDIOVERSION N/A 02/21/2020   Procedure: TRANSESOPHAGEAL ECHOCARDIOGRAM (TEE);  Surgeon: Corliss Skains, MD;  Location: [facility] OR;  Service: Open Heart Surgery;  Laterality: N/A;  . TONSILLECTOMY AND ADENOIDECTOMY  1975  . TRIGGER FINGER RELEASE Left 12/2010   thumb    Current Medications: Current Meds  Medication Sig  . aspirin 81 MG EC tablet Take 1 tablet (81 mg total) by mouth daily.  Marland Kitchen esomeprazole (NEXIUM) 40 MG capsule Take 1 capsule (40 mg total) by mouth daily. Patient needs office visit for further refills  . Evolocumab (REPATHA SURECLICK) 140 MG/ML SOAJ Inject 140 mg into the skin every 14 (fourteen) days.  Marland Kitchen glucagon (GLUCAGON EMERGENCY) 1 MG injection Inject 1 mg into the muscle once as needed (in case of severe hypoglycemia).  . Insulin Glargine (LANTUS SOLOSTAR) 100 UNIT/ML Solostar Pen Inject 12 Units into the skin daily. (Patient taking differently: Inject 8-10 Units into the skin See admin instructions. Inject 10 units into the skin in the morning and 8 units in the evening)  . Insulin Pen Needle (NOVOFINE) 32G X 6 MM MISC USE TO INJECT INSULIN 4 TIMES A DAY  . insulin regular (NOVOLIN R) 100 units/mL injection INJECT 6 UNITS TOTAL INTO THE SKIN 3 (THREE) TIMES DAILY BEFORE MEALS. (Patient taking differently: Inject 1-10 Units into the skin See admin instructions. For glucose 150-210 add 1 additional unit, per scale 211-270= 2 units, 271-330= 3 units, 331-390= 4 units  For breakfast and dinner, administer 1 unit for every 10 grams of carbs. For snacks and dinner, administer 1 unit for every 20 grams of carbs.)  . Insulin  Syringe-Needle U-100 30G X 5/16" 0.3 ML MISC Use to inject insulin 6 times a day.  . Multiple Vitamin (MULTIVITAMIN) tablet Take 1 tablet by mouth daily.    . [DISCONTINUED] aspirin EC 325 MG EC tablet Take 1 tablet (325 mg total) by mouth daily.  . [DISCONTINUED] ezetimibe (ZETIA) 10 MG tablet Take 1 tablet (10 mg total) by mouth daily.     Allergies:   Protonix [pantoprazole], Toujeo solostar [insulin glargine], Lactose intolerance (gi), Magnesium-containing compounds, and Adhesive [tape]   Social History   Socioeconomic History  . Marital status: Married    Spouse name: Not on file  . Number of children: 1  . Years of education: 15  . Highest education level: Bachelor's degree (e.g., BA, AB, BS)  Occupational History  . Occupation: HR Chemical engineer: OTHER    Comment: Scientist, research (medical)  Tobacco Use  . Smoking status: Never Smoker  .  Smokeless tobacco: Never Used  Vaping Use  . Vaping Use: Never used  Substance and Sexual Activity  . Alcohol use: No    Alcohol/week: 0.0 standard drinks  . Drug use: No  . Sexual activity: Not on file  Other Topics Concern  . 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  Has one daughter.  Education: college.      1 dog   Social Determinants of Corporate investment banker Strain:   . Difficulty of Paying Living Expenses:   Food Insecurity:   . Worried About Programme researcher, broadcasting/film/video in the Last Year:   . Barista in the Last Year:   Transportation Needs:   . Freight forwarder (Medical):   Marland Kitchen Lack of Transportation (Non-Medical):   Physical Activity:   . Days of Exercise per Week:   . Minutes of Exercise per Session:   Stress:   . Feeling of Stress :   Social Connections:   . Frequency of Communication with Friends and Family:   . Frequency of Social Gatherings with Friends and Family:   . Attends Religious Services:   . Active  Member of Clubs or Organizations:   . Attends Banker Meetings:   Marland Kitchen Marital Status:      Family History: The patient's family history includes Breast cancer in an other family member; CAD (age of onset: 24) in her brother; COPD in her maternal grandmother; Colon cancer in her maternal grandfather; Colon polyps in her mother; Diabetes in her maternal grandmother; Heart failure in her maternal grandmother; Thyroid disease in her maternal grandmother; Vascular Disease in her maternal grandmother. There is no history of Esophageal cancer, Rectal cancer, or Stomach cancer.  ROS:   Please see the history of present illness.     All other systems reviewed and are negative.  EKGs/Labs/Other Studies Reviewed:    The following studies were reviewed today: Cardiac catheterizations performed on 4/15 (before bypass) and fourth/27 (after bypass surgery) Echocardiograms performed on 4/15 and 4/25  EKG:  EKG is ordered today.  The ekg ordered today demonstrates normal sinus rhythm, QS pattern in leads V1-V2, no ST segment or T wave changes, QTC 420 ms  Recent Labs: 02/20/2020: ALT 14; TSH 2.744 02/22/2020: Magnesium 2.3 02/26/2020: B Natriuretic Peptide 598.5 03/01/2020: BUN 11; Creatinine, Ser 0.97; Hemoglobin 12.7; Platelets 338; Potassium 4.7; Sodium 139  Recent Lipid Panel    Component Value Date/Time   CHOL 196 09/23/2019 0742   TRIG 160.0 (H) 09/23/2019 0742   HDL 58.10 09/23/2019 0742   CHOLHDL 3 09/23/2019 0742   VLDL 32.0 09/23/2019 0742   LDLCALC 106 (H) 09/23/2019 0742   LDLDIRECT 120.0 06/17/2019 0738    Physical Exam:    VS:  BP 128/78   Pulse 84   Ht 5\' 4"  (1.626 m)   Wt 131 lb 3.2 oz (59.5 kg)   SpO2 100%   BMI 22.52 kg/m     Wt Readings from Last 3 Encounters:  05/19/20 131 lb 3.2 oz (59.5 kg)  04/21/20 132 lb (59.9 kg)  04/17/20 132 lb 11.2 oz (60.2 kg)     GEN:  Well nourished, well developed in no acute distress HEENT: Normal NECK: No JVD; No carotid  bruits LYMPHATICS: No lymphadenopathy CARDIAC: RRR, 2/6 early peaking systolic ejection murmur in the aortic focus with limited  radiation no diastolic murmurs, rubs, gallops RESPIRATORY:  Clear to auscultation without rales, wheezing or rhonchi  ABDOMEN: Soft, non-tender, non-distended MUSCULOSKELETAL:  No edema; No deformity  SKIN: Warm and dry NEUROLOGIC:  Alert and oriented x 3 PSYCHIATRIC:  Normal affect   ASSESSMENT:    1. Coronary artery disease involving coronary bypass graft of native heart without angina pectoris   2. Hyperlipidemia due to type 1 diabetes mellitus (HCC)   3. Uncontrolled type 1 diabetes mellitus with circulatory complication, with long-term current use of insulin (HCC)    PLAN:    In order of problems listed above:  1. CAD s/p CABG: Unfortunately had early failure of her SVGs, but she does not have angina pectoris, has preserved left ventricular systolic function and good functional capacity.  LIMA to LAD remains patent.  Prefers conservative management and this is perfectly warranted as long as she is angina free.  I recommended that she should continue her full course of cardiac rehab before returning to work.  Tentatively scheduled to return to work on September 19.  Can reduce aspirin to 81 mg daily.  Intolerant to beta-blockers due to low blood pressure. 2. HLP: Statin intolerant.  On PCSK9 inhibitor.  Check lipid profile in about another month.  Can discontinue the Zetia.  Target LDL less than 70, which should be able to achieve this easily with the Repatha. 3. DM: Glycemic control deteriorated some in the postoperative period, but she believes that recently she has had excellent glucose control.  Will check her hemoglobin A1c when she has her lipid profile as well. Sees Dr. Elvera Lennox.  She is to take a low-dose of ACE inhibitor for renal protection but this was stopped due to her blood pressure issues.  Consider restarting in the future.   Medication  Adjustments/Labs and Tests Ordered: Current medicines are reviewed at length with the patient today.  Concerns regarding medicines are outlined above.  No orders of the defined types were placed in this encounter.  Meds ordered this encounter  Medications  . aspirin 81 MG EC tablet    Sig: Take 1 tablet (81 mg total) by mouth daily.    Patient Instructions  Medication Instructions:  STOP the Ezetimbide (Zetia) DECREASE the Aspirin to 81 mg once daily at the end of this month.   *If you need a refill on your cardiac medications before your next appointment, please call your pharmacy*   Lab Work: None ordered If you have labs (blood work) drawn today and your tests are completely normal, you will receive your results only by: Marland Kitchen MyChart Message (if you have MyChart) OR . A paper copy in the mail If you have any lab test that is abnormal or we need to change your treatment, we will call you to review the results.   Testing/Procedures: None ordered   Follow-Up: At [facility], you and your health needs are our priority.  As part of our continuing mission to provide you with exceptional heart care, we have created designated Provider Care Teams.  These Care Teams include your primary Cardiologist (physician) and Advanced Practice Providers (APPs -  Physician Assistants and Nurse Practitioners) who all work together to provide you with the care you need, when you need it.  We recommend signing up for the patient portal called "MyChart".  Sign up information is provided on this After Visit Summary.  MyChart is used to connect with patients for Virtual Visits (Telemedicine).  Patients are able to view lab/test results,  encounter notes, upcoming appointments, etc.  Non-urgent messages can be sent to your provider as well.   To learn more about what you can do with MyChart, go to [email or web address].    Your next appointment:   6 month(s)  The format for your next appointment:     In Person  Provider:   You may see Thurmon FairMihai Cheryll Keisler, MD or one of the following Advanced Practice Providers on your designated Care Team:    Azalee CourseHao Meng, PA-C  Micah FlesherAngela Duke, [place]-C or   Judy PimpleKrista Kroeger, PA-C     Signed, Thurmon FairMihai Rolly Magri, MD  05/21/2020 5:44 PM    [facility]

## 2020-05-19 NOTE — Patient Instructions (Signed)
Medication Instructions:  STOP the Ezetimbide (Zetia) DECREASE the Aspirin to 81 mg once daily at the end of this month.   *If you need a refill on your cardiac medications before your next appointment, please call your pharmacy*   Lab Work: None ordered If you have labs (blood work) drawn today and your tests are completely normal, you will receive your results only by: Marland Kitchen MyChart Message (if you have MyChart) OR . A paper copy in the mail If you have any lab test that is abnormal or we need to change your treatment, we will call you to review the results.   Testing/Procedures: None ordered   Follow-Up: At [facility], you and your health needs are our priority.  As part of our continuing mission to provide you with exceptional heart care, we have created designated Provider Care Teams.  These Care Teams include your primary Cardiologist (physician) and Advanced Practice Providers (APPs -  Physician Assistants and Nurse Practitioners) who all work together to provide you with the care you need, when you need it.  We recommend signing up for the patient portal called "MyChart".  Sign up information is provided on this After Visit Summary.  MyChart is used to connect with patients for Virtual Visits (Telemedicine).  Patients are able to view lab/test results, encounter notes, upcoming appointments, etc.  Non-urgent messages can be sent to your provider as well.   To learn more about what you can do with MyChart, go to [email or web address].    Your next appointment:   6 month(s)  The format for your next appointment:   In Person  Provider:   You may see Thurmon Fair, MD or one of the following Advanced Practice Providers on your designated Care Team:    Azalee Course, PA-C  Micah Flesher, PA-C or   Judy Pimple, [place]

## 2020-05-21 ENCOUNTER — Encounter: Payer: Self-pay | Admitting: Cardiovascular Disease

## 2020-05-22 ENCOUNTER — Other Ambulatory Visit: Payer: Self-pay

## 2020-05-22 DIAGNOSIS — Z951 Presence of aortocoronary bypass graft: Secondary | ICD-10-CM

## 2020-05-22 NOTE — Progress Notes (Signed)
Completed Initial RD Eval 

## 2020-05-23 ENCOUNTER — Other Ambulatory Visit: Payer: Self-pay

## 2020-05-23 ENCOUNTER — Encounter: Payer: BC Managed Care – PPO | Admitting: *Deleted

## 2020-05-23 DIAGNOSIS — Z7982 Long term (current) use of aspirin: Secondary | ICD-10-CM | POA: Diagnosis not present

## 2020-05-23 DIAGNOSIS — Z87440 Personal history of urinary (tract) infections: Secondary | ICD-10-CM | POA: Diagnosis not present

## 2020-05-23 DIAGNOSIS — I252 Old myocardial infarction: Secondary | ICD-10-CM | POA: Diagnosis not present

## 2020-05-23 DIAGNOSIS — Z87442 Personal history of urinary calculi: Secondary | ICD-10-CM | POA: Diagnosis not present

## 2020-05-23 DIAGNOSIS — E785 Hyperlipidemia, unspecified: Secondary | ICD-10-CM | POA: Diagnosis not present

## 2020-05-23 DIAGNOSIS — K219 Gastro-esophageal reflux disease without esophagitis: Secondary | ICD-10-CM | POA: Diagnosis not present

## 2020-05-23 DIAGNOSIS — E10319 Type 1 diabetes mellitus with unspecified diabetic retinopathy without macular edema: Secondary | ICD-10-CM | POA: Diagnosis not present

## 2020-05-23 DIAGNOSIS — Z951 Presence of aortocoronary bypass graft: Secondary | ICD-10-CM

## 2020-05-23 DIAGNOSIS — Z794 Long term (current) use of insulin: Secondary | ICD-10-CM | POA: Diagnosis not present

## 2020-05-23 DIAGNOSIS — I2510 Atherosclerotic heart disease of native coronary artery without angina pectoris: Secondary | ICD-10-CM | POA: Diagnosis not present

## 2020-05-23 DIAGNOSIS — Z79899 Other long term (current) drug therapy: Secondary | ICD-10-CM | POA: Diagnosis not present

## 2020-05-23 NOTE — Progress Notes (Signed)
Daily Session Note  Patient Details  Name: Felicia [name] MRN: [id number] Date of Birth: [birth year] Referring Provider:     Cardiac Rehab from 04/17/2020 in [facility] Cardiac and Pulmonary Rehab  Referring Provider Melodie Bouillon MD   [facility] Cardiologist: Dr. Arelia Sneddon Croitoru]      Encounter Date: 05/23/2020  Check In:  Session Check In - 05/23/20 0827      Check-In   Supervising physician immediately available to respond to emergencies See telemetry face sheet for immediately available ER MD    Location [facility]-Cardiac & Pulmonary Rehab    Staff Present Nyoka Cowden, RN, BSN, Tyna Jaksch, MS Exercise Physiologist;Amanda Oletta Darter, [place], ACSM CEP, Exercise Physiologist    Virtual Visit No    Medication changes reported     No    Fall or balance concerns reported    No    Tobacco Cessation No Change    Warm-up and Cool-down Performed on first and last piece of equipment    VAD Patient? No    PAD/SET Patient? No      Pain Assessment   Currently in Pain? No/denies              Social History   Tobacco Use  Smoking Status Never Smoker  Smokeless Tobacco Never Used    Goals Met:  Independence with exercise equipment Exercise tolerated well No report of cardiac concerns or symptoms Strength training completed today  Goals Unmet:  Not Applicable  Comments: Pt able to follow exercise prescription today without complaint.  Will continue to monitor for progression.    Dr. Emily Filbert is Medical Director for [place] and [name].

## 2020-05-25 ENCOUNTER — Encounter: Payer: BC Managed Care – PPO | Admitting: *Deleted

## 2020-05-25 ENCOUNTER — Other Ambulatory Visit: Payer: Self-pay

## 2020-05-25 DIAGNOSIS — E785 Hyperlipidemia, unspecified: Secondary | ICD-10-CM | POA: Diagnosis not present

## 2020-05-25 DIAGNOSIS — Z87440 Personal history of urinary (tract) infections: Secondary | ICD-10-CM | POA: Diagnosis not present

## 2020-05-25 DIAGNOSIS — E10319 Type 1 diabetes mellitus with unspecified diabetic retinopathy without macular edema: Secondary | ICD-10-CM | POA: Diagnosis not present

## 2020-05-25 DIAGNOSIS — Z79899 Other long term (current) drug therapy: Secondary | ICD-10-CM | POA: Diagnosis not present

## 2020-05-25 DIAGNOSIS — Z7982 Long term (current) use of aspirin: Secondary | ICD-10-CM | POA: Diagnosis not present

## 2020-05-25 DIAGNOSIS — Z794 Long term (current) use of insulin: Secondary | ICD-10-CM | POA: Diagnosis not present

## 2020-05-25 DIAGNOSIS — K219 Gastro-esophageal reflux disease without esophagitis: Secondary | ICD-10-CM | POA: Diagnosis not present

## 2020-05-25 DIAGNOSIS — Z87442 Personal history of urinary calculi: Secondary | ICD-10-CM | POA: Diagnosis not present

## 2020-05-25 DIAGNOSIS — I252 Old myocardial infarction: Secondary | ICD-10-CM | POA: Diagnosis not present

## 2020-05-25 DIAGNOSIS — Z951 Presence of aortocoronary bypass graft: Secondary | ICD-10-CM | POA: Diagnosis not present

## 2020-05-25 DIAGNOSIS — S52532D Colles' fracture of left radius, subsequent encounter for closed fracture with routine healing: Secondary | ICD-10-CM | POA: Diagnosis not present

## 2020-05-25 DIAGNOSIS — I2510 Atherosclerotic heart disease of native coronary artery without angina pectoris: Secondary | ICD-10-CM | POA: Diagnosis not present

## 2020-05-25 NOTE — Progress Notes (Signed)
Daily Session Note  Patient Details  Name: Felicia [name] MRN: [id number] Date of Birth: 06-15-1961 Referring Provider:     Cardiac Rehab from 04/17/2020 in [facility] Cardiac and Pulmonary Rehab  Referring Provider Melodie Bouillon MD   [facility] Cardiologist: Dr. Arelia Sneddon Croitoru]      Encounter Date: 05/25/2020  Check In:  Session Check In - 05/25/20 0823      Check-In   Supervising physician immediately available to respond to emergencies See telemetry face sheet for immediately available ER MD    Location [facility]-Cardiac & Pulmonary Rehab    Staff Present Heath Lark, RN, BSN, CCRP;Jessica Gen[place], MA, RCEP, CCRP, [place], BA, ACSM CEP, Exercise Physiologist    Virtual Visit No    Medication changes reported     81 mg ASA    Zetia stops after this bottle empty   Fall or balance concerns reported    No    Warm-up and Cool-down Performed on first and last piece of equipment    Resistance Training Performed Yes    VAD Patient? No    PAD/SET Patient? No      Pain Assessment   Currently in Pain? No/denies              Social History   Tobacco Use  Smoking Status Never Smoker  Smokeless Tobacco Never Used    Goals Met:  Independence with exercise equipment Exercise tolerated well No report of cardiac concerns or symptoms  Goals Unmet:  Not Applicable  Comments: Pt able to follow exercise prescription today without complaint.  Will continue to monitor for progression.    Dr. Emily Filbert is Medical Director for [place] and [name].

## 2020-05-29 ENCOUNTER — Encounter: Payer: Self-pay | Admitting: Family Medicine

## 2020-05-29 DIAGNOSIS — E785 Hyperlipidemia, unspecified: Secondary | ICD-10-CM

## 2020-05-29 DIAGNOSIS — E1069 Type 1 diabetes mellitus with other specified complication: Secondary | ICD-10-CM

## 2020-05-30 ENCOUNTER — Other Ambulatory Visit: Payer: Self-pay | Admitting: *Deleted

## 2020-05-30 DIAGNOSIS — I319 Disease of pericardium, unspecified: Secondary | ICD-10-CM

## 2020-05-30 DIAGNOSIS — R52 Pain, unspecified: Secondary | ICD-10-CM

## 2020-05-31 ENCOUNTER — Other Ambulatory Visit: Payer: Self-pay

## 2020-05-31 ENCOUNTER — Encounter: Payer: BC Managed Care – PPO | Admitting: *Deleted

## 2020-05-31 DIAGNOSIS — Z7982 Long term (current) use of aspirin: Secondary | ICD-10-CM | POA: Diagnosis not present

## 2020-05-31 DIAGNOSIS — Z87442 Personal history of urinary calculi: Secondary | ICD-10-CM | POA: Diagnosis not present

## 2020-05-31 DIAGNOSIS — I2510 Atherosclerotic heart disease of native coronary artery without angina pectoris: Secondary | ICD-10-CM | POA: Diagnosis not present

## 2020-05-31 DIAGNOSIS — I252 Old myocardial infarction: Secondary | ICD-10-CM | POA: Diagnosis not present

## 2020-05-31 DIAGNOSIS — Z951 Presence of aortocoronary bypass graft: Secondary | ICD-10-CM

## 2020-05-31 DIAGNOSIS — Z794 Long term (current) use of insulin: Secondary | ICD-10-CM | POA: Diagnosis not present

## 2020-05-31 DIAGNOSIS — Z79899 Other long term (current) drug therapy: Secondary | ICD-10-CM | POA: Diagnosis not present

## 2020-05-31 DIAGNOSIS — Z87440 Personal history of urinary (tract) infections: Secondary | ICD-10-CM | POA: Diagnosis not present

## 2020-05-31 DIAGNOSIS — E785 Hyperlipidemia, unspecified: Secondary | ICD-10-CM | POA: Diagnosis not present

## 2020-05-31 DIAGNOSIS — E10319 Type 1 diabetes mellitus with unspecified diabetic retinopathy without macular edema: Secondary | ICD-10-CM | POA: Diagnosis not present

## 2020-05-31 DIAGNOSIS — K219 Gastro-esophageal reflux disease without esophagitis: Secondary | ICD-10-CM | POA: Diagnosis not present

## 2020-05-31 NOTE — Progress Notes (Signed)
Daily Session Note  Patient Details  Name: Felicia [name] MRN: [id number] Date of Birth: [birth year] Referring Provider:     Cardiac Rehab from 04/17/2020 in [facility] Cardiac and Pulmonary Rehab  Referring Provider Melodie Bouillon MD   [facility] Cardiologist: Dr. Arelia Sneddon Croitoru]      Encounter Date: 05/31/2020  Check In:  Session Check In - 05/31/20 0757      Check-In   Supervising physician immediately available to respond to emergencies See telemetry face sheet for immediately available ER MD    Location [facility]-Cardiac & Pulmonary Rehab    Staff Present Heath Lark, RN, BSN, CCRP;Meredith Sherryll Burger, RN BSN;Melissa Caiola RDN, Rowe Pavy, BA, ACSM CEP, Exercise Physiologist    Virtual Visit No    Medication changes reported     No    Fall or balance concerns reported    No    Warm-up and Cool-down Performed on first and last piece of equipment    Resistance Training Performed Yes    VAD Patient? No    PAD/SET Patient? No      Pain Assessment   Currently in Pain? No/denies              Social History   Tobacco Use  Smoking Status Never Smoker  Smokeless Tobacco Never Used    Goals Met:  Independence with exercise equipment Exercise tolerated well No report of cardiac concerns or symptoms  Goals Unmet:  Not Applicable  Comments: Pt able to follow exercise prescription today without complaint.  Will continue to monitor for progression.    Dr. Emily Filbert is Medical Director for [place] and [name].

## 2020-06-01 ENCOUNTER — Encounter: Payer: BC Managed Care – PPO | Admitting: *Deleted

## 2020-06-01 ENCOUNTER — Other Ambulatory Visit: Payer: Self-pay

## 2020-06-01 DIAGNOSIS — Z794 Long term (current) use of insulin: Secondary | ICD-10-CM | POA: Diagnosis not present

## 2020-06-01 DIAGNOSIS — I252 Old myocardial infarction: Secondary | ICD-10-CM | POA: Diagnosis not present

## 2020-06-01 DIAGNOSIS — I214 Non-ST elevation (NSTEMI) myocardial infarction: Secondary | ICD-10-CM

## 2020-06-01 DIAGNOSIS — Z87442 Personal history of urinary calculi: Secondary | ICD-10-CM | POA: Diagnosis not present

## 2020-06-01 DIAGNOSIS — E10319 Type 1 diabetes mellitus with unspecified diabetic retinopathy without macular edema: Secondary | ICD-10-CM | POA: Diagnosis not present

## 2020-06-01 DIAGNOSIS — I2510 Atherosclerotic heart disease of native coronary artery without angina pectoris: Secondary | ICD-10-CM | POA: Diagnosis not present

## 2020-06-01 DIAGNOSIS — Z87440 Personal history of urinary (tract) infections: Secondary | ICD-10-CM | POA: Diagnosis not present

## 2020-06-01 DIAGNOSIS — E785 Hyperlipidemia, unspecified: Secondary | ICD-10-CM | POA: Diagnosis not present

## 2020-06-01 DIAGNOSIS — Z79899 Other long term (current) drug therapy: Secondary | ICD-10-CM | POA: Diagnosis not present

## 2020-06-01 DIAGNOSIS — Z951 Presence of aortocoronary bypass graft: Secondary | ICD-10-CM | POA: Diagnosis not present

## 2020-06-01 DIAGNOSIS — Z7982 Long term (current) use of aspirin: Secondary | ICD-10-CM | POA: Diagnosis not present

## 2020-06-01 DIAGNOSIS — K219 Gastro-esophageal reflux disease without esophagitis: Secondary | ICD-10-CM | POA: Diagnosis not present

## 2020-06-01 NOTE — Progress Notes (Signed)
Daily Session Note  Patient Details  Name: KYMANI LAURSEN MRN: [id number] Date of Birth: Nov 19, 1960 Referring Provider:     Cardiac Rehab from 04/17/2020 in [facility] Cardiac and Pulmonary Rehab  Referring Provider Melodie Bouillon MD   [facility] Cardiologist: Dr. Arelia Sneddon Croitoru]      Encounter Date: 06/01/2020  Check In:  Session Check In - 06/01/20 0840      Check-In   Supervising physician immediately available to respond to emergencies See telemetry face sheet for immediately available ER MD    Location [facility]-Cardiac & Pulmonary Rehab    Staff Present Renita Papa, RN BSN;Melissa Caiola RDN, Rowe Pavy, BA, ACSM CEP, Exercise Physiologist;Jessica Luan Pulling, MA, RCEP, CCRP, CCET    Virtual Visit No    Medication changes reported     No    Fall or balance concerns reported    No    Warm-up and Cool-down Performed on first and last piece of equipment    Resistance Training Performed Yes    VAD Patient? No    PAD/SET Patient? No      Pain Assessment   Currently in Pain? No/denies              Social History   Tobacco Use  Smoking Status Never Smoker  Smokeless Tobacco Never Used    Goals Met:  Independence with exercise equipment Exercise tolerated well No report of cardiac concerns or symptoms Strength training completed today  Goals Unmet:  Not Applicable  Comments: Pt able to follow exercise prescription today without complaint.  Will continue to monitor for progression.    Dr. Emily Filbert is Medical Director for [place] and [name].

## 2020-06-02 ENCOUNTER — Encounter: Payer: Self-pay | Admitting: Family Medicine

## 2020-06-02 DIAGNOSIS — I319 Disease of pericardium, unspecified: Secondary | ICD-10-CM

## 2020-06-02 DIAGNOSIS — L905 Scar conditions and fibrosis of skin: Secondary | ICD-10-CM

## 2020-06-06 ENCOUNTER — Other Ambulatory Visit: Payer: Self-pay

## 2020-06-06 ENCOUNTER — Encounter: Payer: BC Managed Care – PPO | Attending: Cardiovascular Disease | Admitting: *Deleted

## 2020-06-06 DIAGNOSIS — Z87440 Personal history of urinary (tract) infections: Secondary | ICD-10-CM | POA: Insufficient documentation

## 2020-06-06 DIAGNOSIS — K219 Gastro-esophageal reflux disease without esophagitis: Secondary | ICD-10-CM | POA: Insufficient documentation

## 2020-06-06 DIAGNOSIS — I252 Old myocardial infarction: Secondary | ICD-10-CM | POA: Diagnosis not present

## 2020-06-06 DIAGNOSIS — Z7982 Long term (current) use of aspirin: Secondary | ICD-10-CM | POA: Insufficient documentation

## 2020-06-06 DIAGNOSIS — Z87442 Personal history of urinary calculi: Secondary | ICD-10-CM | POA: Diagnosis not present

## 2020-06-06 DIAGNOSIS — Z951 Presence of aortocoronary bypass graft: Secondary | ICD-10-CM | POA: Diagnosis not present

## 2020-06-06 DIAGNOSIS — E10319 Type 1 diabetes mellitus with unspecified diabetic retinopathy without macular edema: Secondary | ICD-10-CM | POA: Diagnosis not present

## 2020-06-06 DIAGNOSIS — Z794 Long term (current) use of insulin: Secondary | ICD-10-CM | POA: Insufficient documentation

## 2020-06-06 DIAGNOSIS — Z79899 Other long term (current) drug therapy: Secondary | ICD-10-CM | POA: Diagnosis not present

## 2020-06-06 DIAGNOSIS — I2510 Atherosclerotic heart disease of native coronary artery without angina pectoris: Secondary | ICD-10-CM | POA: Insufficient documentation

## 2020-06-06 DIAGNOSIS — E785 Hyperlipidemia, unspecified: Secondary | ICD-10-CM | POA: Diagnosis not present

## 2020-06-06 NOTE — Progress Notes (Signed)
Daily Session Note  Patient Details  Name: Felicia [name] MRN: [id number] Date of Birth: 19-Oct-1961 Referring Provider:     Cardiac Rehab from 04/17/2020 in [facility] Cardiac and Pulmonary Rehab  Referring Provider Melodie Bouillon MD   [facility] Cardiologist: Dr. Arelia Sneddon Croitoru]      Encounter Date: 06/06/2020  Check In:  Session Check In - 06/06/20 0841      Check-In   Supervising physician immediately available to respond to emergencies See telemetry face sheet for immediately available ER MD    Location [facility]-Cardiac & Pulmonary Rehab    Staff Present Heath Lark, RN, BSN, Jacklynn Bue, MS Exercise Physiologist;Amanda Oletta Darter, [place], ACSM CEP, Exercise Physiologist    Virtual Visit No    Medication changes reported     No    Fall or balance concerns reported    No    Warm-up and Cool-down Performed on first and last piece of equipment    Resistance Training Performed Yes    VAD Patient? No    PAD/SET Patient? No      Pain Assessment   Currently in Pain? No/denies              Social History   Tobacco Use  Smoking Status Never Smoker  Smokeless Tobacco Never Used    Goals Met:  Independence with exercise equipment Exercise tolerated well No report of cardiac concerns or symptoms  Goals Unmet:  Not Applicable  Comments: Pt able to follow exercise prescription today without complaint.  Will continue to monitor for progression.    Dr. Emily Filbert is Medical Director for [facility] and [name].

## 2020-06-08 ENCOUNTER — Encounter: Payer: BC Managed Care – PPO | Admitting: *Deleted

## 2020-06-08 ENCOUNTER — Other Ambulatory Visit: Payer: Self-pay

## 2020-06-08 DIAGNOSIS — Z79899 Other long term (current) drug therapy: Secondary | ICD-10-CM | POA: Diagnosis not present

## 2020-06-08 DIAGNOSIS — Z951 Presence of aortocoronary bypass graft: Secondary | ICD-10-CM | POA: Diagnosis not present

## 2020-06-08 DIAGNOSIS — I2510 Atherosclerotic heart disease of native coronary artery without angina pectoris: Secondary | ICD-10-CM | POA: Diagnosis not present

## 2020-06-08 DIAGNOSIS — E10319 Type 1 diabetes mellitus with unspecified diabetic retinopathy without macular edema: Secondary | ICD-10-CM | POA: Diagnosis not present

## 2020-06-08 DIAGNOSIS — E785 Hyperlipidemia, unspecified: Secondary | ICD-10-CM | POA: Diagnosis not present

## 2020-06-08 DIAGNOSIS — Z87442 Personal history of urinary calculi: Secondary | ICD-10-CM | POA: Diagnosis not present

## 2020-06-08 DIAGNOSIS — I252 Old myocardial infarction: Secondary | ICD-10-CM | POA: Diagnosis not present

## 2020-06-08 DIAGNOSIS — Z7982 Long term (current) use of aspirin: Secondary | ICD-10-CM | POA: Diagnosis not present

## 2020-06-08 DIAGNOSIS — Z794 Long term (current) use of insulin: Secondary | ICD-10-CM | POA: Diagnosis not present

## 2020-06-08 DIAGNOSIS — K219 Gastro-esophageal reflux disease without esophagitis: Secondary | ICD-10-CM | POA: Diagnosis not present

## 2020-06-08 DIAGNOSIS — Z87440 Personal history of urinary (tract) infections: Secondary | ICD-10-CM | POA: Diagnosis not present

## 2020-06-08 NOTE — Progress Notes (Signed)
Daily Session Note  Patient Details  Name: Felicia [name] MRN: [id number] Date of Birth: 08/10/1961 Referring Provider:     Cardiac Rehab from 04/17/2020 in [facility]  Referring Provider Lightfoot, Harrell MD   [Primary Cardiologist: Dr. Mahai Croitoru]      Encounter Date: 06/08/2020  Check In:  Session Check In - 06/08/20 0920      Check-In   Supervising physician immediately available to respond to emergencies See telemetry face sheet for immediately available ER MD    Location [facility]    Staff Present Susanne Bice, RN, BSN, CCRP;Meredith Craven, RN BSN;Amanda Sommer, BA, ACSM CEP, Exercise Physiologist;Melissa Caiola RDN, LDN    Virtual Visit No    Medication changes reported     No    Fall or balance concerns reported    No    Warm-up and Cool-down Performed on first and last piece of equipment    Resistance Training Performed Yes    VAD Patient? No    PAD/SET Patient? No      Pain Assessment   Currently in Pain? No/denies              Social History   Tobacco Use  Smoking Status Never Smoker  Smokeless Tobacco Never Used    Goals Met:  Independence with exercise equipment Exercise tolerated well No report of cardiac concerns or symptoms  Goals Unmet:  Not Applicable  Comments: Pt able to follow exercise prescription today without complaint.  Will continue to monitor for progression.    Dr. Mark Miller is Medical Director for [facility] and [name]. 

## 2020-06-09 ENCOUNTER — Other Ambulatory Visit: Payer: Self-pay

## 2020-06-09 ENCOUNTER — Other Ambulatory Visit (INDEPENDENT_AMBULATORY_CARE_PROVIDER_SITE_OTHER): Payer: BC Managed Care – PPO

## 2020-06-09 DIAGNOSIS — I319 Disease of pericardium, unspecified: Secondary | ICD-10-CM | POA: Diagnosis not present

## 2020-06-09 DIAGNOSIS — E785 Hyperlipidemia, unspecified: Secondary | ICD-10-CM | POA: Diagnosis not present

## 2020-06-09 DIAGNOSIS — E1069 Type 1 diabetes mellitus with other specified complication: Secondary | ICD-10-CM

## 2020-06-09 DIAGNOSIS — L905 Scar conditions and fibrosis of skin: Secondary | ICD-10-CM

## 2020-06-09 DIAGNOSIS — R52 Pain, unspecified: Secondary | ICD-10-CM

## 2020-06-09 LAB — HEPATIC FUNCTION PANEL
ALT: 19 U/L (ref 0–35)
AST: 23 U/L (ref 0–37)
Albumin: 3.8 g/dL (ref 3.5–5.2)
Alkaline Phosphatase: 80 U/L (ref 39–117)
Bilirubin, Direct: 0.1 mg/dL (ref 0.0–0.3)
Total Bilirubin: 0.3 mg/dL (ref 0.2–1.2)
Total Protein: 6.4 g/dL (ref 6.0–8.3)

## 2020-06-09 LAB — LIPID PANEL
Cholesterol: 142 mg/dL (ref 0–200)
HDL: 50.7 mg/dL (ref >39.00)
LDL Cholesterol: 59 mg/dL (ref 0–99)
NonHDL: 91.26
Total CHOL/HDL Ratio: 3
Triglycerides: 163 mg/dL — ABNORMAL HIGH (ref 0.0–149.0)
VLDL: 32.6 mg/dL (ref 0.0–40.0)

## 2020-06-09 LAB — SEDIMENTATION RATE: Sed Rate: 22 mm/hr (ref 0–30)

## 2020-06-09 LAB — C-REACTIVE PROTEIN: CRP: <1 mg/dL (ref 0.5–20.0)

## 2020-06-12 ENCOUNTER — Ambulatory Visit (HOSPITAL_COMMUNITY): Payer: BC Managed Care – PPO | Attending: Cardiology

## 2020-06-12 ENCOUNTER — Other Ambulatory Visit: Payer: Self-pay

## 2020-06-12 DIAGNOSIS — R52 Pain, unspecified: Secondary | ICD-10-CM | POA: Insufficient documentation

## 2020-06-12 DIAGNOSIS — I319 Disease of pericardium, unspecified: Secondary | ICD-10-CM | POA: Diagnosis not present

## 2020-06-12 LAB — ECHOCARDIOGRAM LIMITED
Area-P 1/2: 5.38 cm2
S' Lateral: 2.5 cm

## 2020-06-13 ENCOUNTER — Telehealth: Payer: Self-pay | Admitting: *Deleted

## 2020-06-13 ENCOUNTER — Encounter: Payer: BC Managed Care – PPO | Admitting: *Deleted

## 2020-06-13 DIAGNOSIS — K219 Gastro-esophageal reflux disease without esophagitis: Secondary | ICD-10-CM | POA: Diagnosis not present

## 2020-06-13 DIAGNOSIS — Z79899 Other long term (current) drug therapy: Secondary | ICD-10-CM | POA: Diagnosis not present

## 2020-06-13 DIAGNOSIS — Z794 Long term (current) use of insulin: Secondary | ICD-10-CM | POA: Diagnosis not present

## 2020-06-13 DIAGNOSIS — Z951 Presence of aortocoronary bypass graft: Secondary | ICD-10-CM | POA: Diagnosis not present

## 2020-06-13 DIAGNOSIS — Z87442 Personal history of urinary calculi: Secondary | ICD-10-CM | POA: Diagnosis not present

## 2020-06-13 DIAGNOSIS — E785 Hyperlipidemia, unspecified: Secondary | ICD-10-CM | POA: Diagnosis not present

## 2020-06-13 DIAGNOSIS — Z87440 Personal history of urinary (tract) infections: Secondary | ICD-10-CM | POA: Diagnosis not present

## 2020-06-13 DIAGNOSIS — E10319 Type 1 diabetes mellitus with unspecified diabetic retinopathy without macular edema: Secondary | ICD-10-CM | POA: Diagnosis not present

## 2020-06-13 DIAGNOSIS — I252 Old myocardial infarction: Secondary | ICD-10-CM | POA: Diagnosis not present

## 2020-06-13 DIAGNOSIS — I2510 Atherosclerotic heart disease of native coronary artery without angina pectoris: Secondary | ICD-10-CM | POA: Diagnosis not present

## 2020-06-13 DIAGNOSIS — Z7982 Long term (current) use of aspirin: Secondary | ICD-10-CM | POA: Diagnosis not present

## 2020-06-13 NOTE — Telephone Encounter (Signed)
Spoke with the patient concerning her echo results and MyChart messages.  No change in LV function/wall motion since the postop echo. Signs of increased mean left atrial pressure are present (no Doppler on the previous study).  She denies any shortness of breath and stated that she does well when running on the treadmill in rehab. She would like to know if there is anything she can do to help prevent the high filling pressures.   Is there anything I can do to help the fluid issue?  Will this cause me long term problems and why would this be happening.  As far as my heart goes then if I am understanding things appear to be ok as they are for right now?

## 2020-06-13 NOTE — Telephone Encounter (Signed)
Message sent to MyChart.

## 2020-06-13 NOTE — Progress Notes (Signed)
Daily Session Note  Patient Details  Name: Felicia [name] MRN: [id number] Date of Birth: 1961-07-29 Referring Provider:     Cardiac Rehab from 04/17/2020 in [facility]  Referring Provider Melodie Bouillon MD   [facility] Cardiologist: Dr. Arelia Sneddon Croitoru]      Encounter Date: 06/13/2020  Check In:  Session Check In - 06/13/20 0847      Check-In   Supervising physician immediately available to respond to emergencies See telemetry face sheet for immediately available ER MD    Location [facility]    Staff Present Heath Lark, RN, BSN, Jacklynn Bue, MS Exercise Physiologist;Amanda Oletta Darter, [place], ACSM CEP, Exercise Physiologist    Virtual Visit No    Medication changes reported     No    Fall or balance concerns reported    No    Warm-up and Cool-down Performed on first and last piece of equipment    Resistance Training Performed Yes    VAD Patient? No    PAD/SET Patient? No      Pain Assessment   Currently in Pain? No/denies              Social History   Tobacco Use  Smoking Status Never Smoker  Smokeless Tobacco Never Used    Goals Met:  Independence with exercise equipment Exercise tolerated well No report of cardiac concerns or symptoms  Goals Unmet:  Not Applicable  Comments: Pt able to follow exercise prescription today without complaint.  Will continue to monitor for progression.    Dr. Emily Filbert is Medical Director for [place] and [name].

## 2020-06-13 NOTE — Telephone Encounter (Signed)
Things are going well - no change since April. Needs to avoid excessive sodium in diet to prevent fluid accumulation (stay away from preserved foods from deli or in cans, salty restaurant food, gatorade, etc.)

## 2020-06-14 ENCOUNTER — Encounter: Payer: Self-pay | Admitting: *Deleted

## 2020-06-14 DIAGNOSIS — Z951 Presence of aortocoronary bypass graft: Secondary | ICD-10-CM

## 2020-06-14 NOTE — Progress Notes (Signed)
Cardiac Individual Treatment Plan  Patient Details  Name: Felicia [name] MRN: [id number] Date of Birth: [birth year] Referring Provider:     Cardiac Rehab from 04/17/2020 in [facility] Cardiac and Pulmonary Rehab  Referring Provider Melodie Bouillon MD   [facility] Cardiologist: Dr. Arelia Sneddon Croitoru]      Initial Encounter Date:    Cardiac Rehab from 04/17/2020 in [facility] Cardiac and Pulmonary Rehab  Date 04/17/20      Visit Diagnosis: S/P CABG x 5  Patient's Home Medications on Admission:  Current Outpatient Medications:  .  aspirin 81 MG EC tablet, Take 1 tablet (81 mg total) by mouth daily., Disp: , Rfl:  .  esomeprazole (NEXIUM) 40 MG capsule, Take 1 capsule (40 mg total) by mouth daily. Patient needs office visit for further refills, Disp: 90 capsule, Rfl: 0 .  Evolocumab (REPATHA SURECLICK) 734 MG/ML SOAJ, Inject 140 mg into the skin every 14 (fourteen) days., Disp: 2 pen, Rfl: 11 .  glucagon (GLUCAGON EMERGENCY) 1 MG injection, Inject 1 mg into the muscle once as needed (in case of severe hypoglycemia)., Disp: 1 each, Rfl: 12 .  Insulin Glargine (LANTUS SOLOSTAR) 100 UNIT/ML Solostar Pen, Inject 12 Units into the skin daily. (Patient taking differently: Inject 8-10 Units into the skin See admin instructions. Inject 10 units into the skin in the morning and 8 units in the evening), Disp: 5 pen, Rfl: 11 .  Insulin Pen Needle (NOVOFINE) 32G X 6 MM MISC, USE TO INJECT INSULIN 4 TIMES A DAY, Disp: 200 each, Rfl: 1 .  insulin regular (NOVOLIN R) 100 units/mL injection, INJECT 6 UNITS TOTAL INTO THE SKIN 3 (THREE) TIMES DAILY BEFORE MEALS. (Patient taking differently: Inject 1-10 Units into the skin See admin instructions. For glucose 150-210 add 1 additional unit, per scale 211-270= 2 units, 271-330= 3 units, 331-390= 4 units  For breakfast and dinner, administer 1 unit for every 10 grams of carbs. For snacks and dinner, administer 1 unit for every 20 grams of carbs.), Disp: 60 mL, Rfl: 1 .  Insulin  Syringe-Needle U-100 30G X 5/16" 0.3 ML MISC, Use to inject insulin 6 times a day., Disp: 600 each, Rfl: 11 .  Multiple Vitamin (MULTIVITAMIN) tablet, Take 1 tablet by mouth daily.  , Disp: , Rfl:   Past Medical History: Past Medical History:  Diagnosis Date  . Allergy   . Anemia    past history long ago  . Borderline HTN (hypertension)   . CAD (coronary artery disease)    a. 02/2020 Cath: LM nl, LAD large, 90p, 28m D1 90p, LCX nl, OM 60-70p, RCA dominant w/ dampening upon engagement of ostium. 70p/m, RPDA 50. EF 55-65%.  . Disorder of kidney    has abd kidney (right)  . GERD (gastroesophageal reflux disease)   . History of diabetic gastroparesis   . History of echocardiogram    a. 01/2013 Echo: EF 55-65%, no rwma. Mild MR. Nl RV fxn.  .Marland KitchenHLD (hyperlipidemia)   . Hx: UTI (urinary tract infection)   . IBS (irritable bowel syndrome)   . Nephrolithiasis 2005  . Osteopenia 2012   per pt  . Systolic murmur 30/3709  mild mitral insuff  . Type 1 diabetes mellitus with diabetic retinopathy without macular edema (HCC)    dx at 59y/o, background retinopathy    Tobacco Use: Social History   Tobacco Use  Smoking Status Never Smoker  Smokeless Tobacco Never Used    Labs: Recent Review [employer], physiological  Labs for ITP Cardiac and Pulmonary Rehab Latest Ref Rng & Units 02/21/2020 02/21/2020 02/21/2020 02/21/2020 06/09/2020   Cholestrol 0 - 200 mg/dL - - - - 142   LDLCALC 0 - 99 mg/dL - - - - 59   LDLDIRECT mg/dL - - - - -   HDL >39.00 mg/dL - - - - 50.70   Trlycerides 0 - 149 mg/dL - - - - 163.0(H)   Hemoglobin A1c 4.8 - 5.6 % - - - - -   PHART 7.35 - 7.45 7.485(H) 7.344(L) 7.292(L) 7.314(L) -   PCO2ART 32 - 48 mmHg 28.8(L) 38.7 43.3 46.7 -   HCO3 20.0 - 28.0 mmol/L 21.7 21.1 20.7 23.3 -   TCO2 22 - 32 mmol/L 23 22 22 25  -   ACIDBASEDEF 0.0 - 2.0 mmol/L 1.0 4.0(H) 5.0(H) 3.0(H) -   O2SAT % 98.0 99.0 88.0 100.0 -       Exercise Target Goals: Exercise Program Goal: Individual  exercise prescription set using results from initial 6 min walk test and THRR while considering  patient's activity barriers and safety.   Exercise Prescription Goal: Initial exercise prescription builds to 30-45 minutes a day of aerobic activity, 2-3 days per week.  Home exercise guidelines will be given to patient during program as part of exercise prescription that the participant will acknowledge.   Education: Aerobic Exercise & Resistance Training: - Gives group verbal and written instruction on the various components of exercise. Focuses on aerobic and resistive training programs and the benefits of this training and how to safely progress through these programs..   Education: Exercise & Equipment Safety: - Individual verbal instruction and demonstration of equipment use and safety with use of the equipment.   Cardiac Rehab from 06/08/2020 in [facility] Cardiac and Pulmonary Rehab  Date 04/17/20  Educator [facility]  Instruction Review Code 1- Verbalizes Understanding      Education: Exercise Physiology & General Exercise Guidelines: - Group verbal and written instruction with models to review the exercise physiology of the cardiovascular system and associated critical values. Provides general exercise guidelines with specific guidelines to those with heart or lung disease.    Cardiac Rehab from 06/08/2020 in [facility] Cardiac and Pulmonary Rehab  Date 05/18/20  Educator [facility]  Instruction Review Code 1- Verbalizes Understanding      Education: Flexibility, Balance, Mind/Body Relaxation: Provides group verbal/written instruction on the benefits of flexibility and balance training, including mind/body exercise modes such as yoga, pilates and tai chi.  Demonstration and skill practice provided.   Activity Barriers & Risk Stratification:  Activity Barriers & Cardiac Risk Stratification - 04/17/20 1031      Activity Barriers & Cardiac Risk Stratification   Activity Barriers Deconditioning;Incisional  Pain;Muscular Weakness;Joint Problems;Balance Concerns;Other (comment);History of Falls;Back Problems;Neck/Spine Problems    Comments no heavy weights for 3 more weeks    Cardiac Risk Stratification Moderate           6 Minute Walk:  6 Minute Walk    Row Name 04/17/20 1030         6 Minute Walk   Phase Initial     Distance 940 feet     Walk Time 6 minutes     # of Rest Breaks 0     MPH 1.78     METS 3.65     RPE 13     VO2 Peak 12.77     Symptoms No     Resting HR 92 bpm     Resting  BP 118/64     Resting Oxygen Saturation  99 %     Exercise Oxygen Saturation  during 6 min walk 99 %     Max Ex. HR 106 bpm     Max Ex. BP 186/82     2 Minute Post BP 154/82            Oxygen Initial Assessment:   Oxygen Re-Evaluation:   Oxygen Discharge (Final Oxygen Re-Evaluation):   Initial Exercise Prescription:  Initial Exercise Prescription - 04/17/20 1000      Date of Initial Exercise RX and Referring Provider   Date 04/17/20    Referring Provider Melodie Bouillon MD    Primary Cardiologist: Dr. Arelia Sneddon Croitoru     Treadmill   MPH 1.7    Grade 0    Minutes 15    METs 2.3      REL-XR   Level 2    Speed 50    Minutes 15    METs 2.5      T5 Nustep   Level 3    SPM 80    Minutes 15    METs 2.5      Prescription Details   Frequency (times per week) 2    Duration Progress to 30 minutes of continuous aerobic without signs/symptoms of physical distress      Intensity   THRR 40-80% of Max Heartrate 120-149    Ratings of Perceived Exertion 11-13    Perceived Dyspnea 0-4      Progression   Progression Continue to progress workloads to maintain intensity without signs/symptoms of physical distress.      Resistance Training   Training Prescription Yes    Weight 3lbs (right), 1 lb (left)    Reps 10-15           Perform Capillary Blood Glucose checks as needed.  Exercise Prescription Changes:  Exercise Prescription Changes    Row Name 04/17/20 1000  04/19/20 1200 05/02/20 1500 05/16/20 1400 05/30/20 1400     Response to Exercise   Blood Pressure (Admit) 118/64 114/58 138/64 138/68 126/62   Blood Pressure (Exercise) 186/82  rck 154/82 128/58 144/62 128/56 128/70   Blood Pressure (Exit) 124/72 112/56 122/64 114/64 118/68   Heart Rate (Admit) 92 bpm 93 bpm 91 bpm 94 bpm 85 bpm   Heart Rate (Exercise) 106 bpm 108 bpm 113 bpm 110 bpm 110 bpm   Heart Rate (Exit) 97 bpm 94 bpm 91 bpm 86 bpm 82 bpm   Oxygen Saturation (Admit) 99 % -- -- -- --   Oxygen Saturation (Exercise) 99 % -- -- -- --   Rating of Perceived Exertion (Exercise) 13 11 11 11 11    Symptoms none none none none none   Comments walk test results -- -- -- --   Duration -- Continue with 30 min of aerobic exercise without signs/symptoms of physical distress. Continue with 30 min of aerobic exercise without signs/symptoms of physical distress. Continue with 30 min of aerobic exercise without signs/symptoms of physical distress. Continue with 30 min of aerobic exercise without signs/symptoms of physical distress.   Intensity -- THRR unchanged THRR unchanged THRR unchanged THRR unchanged     Progression   Progression -- -- Continue to progress workloads to maintain intensity without signs/symptoms of physical distress. Continue to progress workloads to maintain intensity without signs/symptoms of physical distress. Continue to progress workloads to maintain intensity without signs/symptoms of physical distress.   Average METs -- 3.3 3.9 4.5 5.4  Resistance Training   Training Prescription -- Yes Yes Yes Yes   Weight -- 3lbs (right), 1 lb (left) 3lbs (right), 1 lb (left) 3lbs (right), 1 lb (left) 3lbs (right), 1 lb (left)   Reps -- 10-15 10-15 10-15 10-15     Interval Training   Interval Training -- -- No No No     Treadmill   MPH -- 1.7 2.1 2.4 2.7   Grade -- 0 1 1.5 2.5   Minutes -- 15 15 15 15    METs -- 2.3 2.9 3.33 4     REL-XR   Level -- 2 2 3 3    Speed -- 50 -- 50 --    Minutes -- 15 15 15 15    METs -- 4.3 6.3 5.7 6.8     T5 Nustep   Level -- -- 3 -- --   Minutes -- -- 15 -- --   METs -- -- 2.5 -- --     Home Exercise Plan   Plans to continue exercise at -- -- Home (comment)  walking, biking Home (comment)  walking, biking Home (comment)  walking, biking   Frequency -- -- Add 2 additional days to program exercise sessions. Add 2 additional days to program exercise sessions. Add 2 additional days to program exercise sessions.   Initial Home Exercises Provided -- -- 04/27/20 04/27/20 04/27/20   Row Name 06/13/20 1400             Response to Exercise   Blood Pressure (Admit) 142/72       Blood Pressure (Exercise) 132/54       Blood Pressure (Exit) 146/70       Heart Rate (Admit) 77 bpm       Heart Rate (Exercise) 117 bpm       Heart Rate (Exit) 80 bpm       Rating of Perceived Exertion (Exercise) 13       Symptoms none       Duration Continue with 30 min of aerobic exercise without signs/symptoms of physical distress.       Intensity THRR unchanged         Progression   Progression Continue to progress workloads to maintain intensity without signs/symptoms of physical distress.       Average METs 5.22         Resistance Training   Training Prescription Yes       Weight 3lbs (right), 1 lb (left)       Reps 10-15         Interval Training   Interval Training No         Treadmill   MPH 3       Grade 3       Minutes 15       METs 4.54         REL-XR   Level 3       Minutes 15       METs 5.9         Home Exercise Plan   Plans to continue exercise at Home (comment)  walking, biking       Frequency Add 2 additional days to program exercise sessions.       Initial Home Exercises Provided 04/27/20              Exercise Comments:  Exercise Comments    Row Name 04/18/20 0949           Exercise Comments First full day  of exercise!  Patient was oriented to gym and equipment including functions, settings, policies, and  procedures.  Patient's individual exercise prescription and treatment plan were reviewed.  All starting workloads were established based on the results of the 6 minute walk test done at initial orientation visit.  The plan for exercise progression was also introduced and progression will be customized based on patient's performance and goals.              Exercise Goals and Review:  Exercise Goals    Row Name 04/17/20 1036             Exercise Goals   Increase Physical Activity Yes       Intervention Provide advice, education, support and counseling about physical activity/exercise needs.;Develop an individualized exercise prescription for aerobic and resistive training based on initial evaluation findings, risk stratification, comorbidities and participant's personal goals.       Expected Outcomes Short Term: Attend rehab on a regular basis to increase amount of physical activity.;Long Term: Add in home exercise to make exercise part of routine and to increase amount of physical activity.;Long Term: Exercising regularly at least 3-5 days a week.       Increase Strength and Stamina Yes       Intervention Provide advice, education, support and counseling about physical activity/exercise needs.;Develop an individualized exercise prescription for aerobic and resistive training based on initial evaluation findings, risk stratification, comorbidities and participant's personal goals.       Expected Outcomes Short Term: Increase workloads from initial exercise prescription for resistance, speed, and METs.;Short Term: Perform resistance training exercises routinely during rehab and add in resistance training at home;Long Term: Improve cardiorespiratory fitness, muscular endurance and strength as measured by increased METs and functional capacity (6MWT)       Able to understand and use rate of perceived exertion (RPE) scale Yes       Intervention Provide education and explanation on how to use RPE  scale       Expected Outcomes Long Term:  Able to use RPE to guide intensity level when exercising independently;Short Term: Able to use RPE daily in rehab to express subjective intensity level       Able to understand and use Dyspnea scale Yes       Intervention Provide education and explanation on how to use Dyspnea scale       Expected Outcomes Long Term: Able to use Dyspnea scale to guide intensity level when exercising independently;Short Term: Able to use Dyspnea scale daily in rehab to express subjective sense of shortness of breath during exertion       Knowledge and understanding of Target Heart Rate Range (THRR) Yes       Intervention Provide education and explanation of THRR including how the numbers were predicted and where they are located for reference       Expected Outcomes Short Term: Able to state/look up THRR;Short Term: Able to use daily as guideline for intensity in rehab;Long Term: Able to use THRR to govern intensity when exercising independently       Able to check pulse independently Yes       Intervention Provide education and demonstration on how to check pulse in carotid and radial arteries.;Review the importance of being able to check your own pulse for safety during independent exercise       Expected Outcomes Short Term: Able to explain why pulse checking is important during independent exercise;Long Term: Able to check pulse  independently and accurately       Understanding of Exercise Prescription Yes       Intervention Provide education, explanation, and written materials on patient's individual exercise prescription       Expected Outcomes Short Term: Able to explain program exercise prescription;Long Term: Able to explain home exercise prescription to exercise independently              Exercise Goals Re-Evaluation :  Exercise Goals Re-Evaluation    Row Name 04/18/20 0949 04/27/20 0808 05/02/20 1534 05/16/20 1426 05/30/20 1443     Exercise Goal Re-Evaluation    Exercise Goals Review Able to understand and use rate of perceived exertion (RPE) scale;Knowledge and understanding of Target Heart Rate Range (THRR);Understanding of Exercise Prescription Increase Physical Activity;Increase Strength and Stamina;Understanding of Exercise Prescription Increase Physical Activity;Increase Strength and Stamina;Understanding of Exercise Prescription Increase Physical Activity;Increase Strength and Stamina;Understanding of Exercise Prescription Increase Physical Activity;Increase Strength and Stamina;Understanding of Exercise Prescription   Comments Reviewed RPE and dyspnea scales, THR and program prescription with pt today.  Pt voiced understanding and was given a copy of goals to take home. Geraldyn is off to a good start in rehab.  Her strength and stamina are recovering and she is no longer as tired.  She is already riding her bike and walking at home.  Reviewed home exercise with pt today.  Pt plans to continue to ride her bike and walk at home for exercise.  She was already at 20 min and will increase to 30 min. Reviewed THR, pulse, RPE, sign and symptoms, pulse oximetery and when to call 911 or MD.  Also discussed weather considerations and indoor options.  Pt voiced understanding. Amily is off on vacation this week, walking on the beach!  She  is up to 6.3 METs on the XR.  We will continue to monitor her progress. Kikuye continues to increase workloads on machines.  She continues to have some issues with her wrist but is seeing OT. Shannon has been doing well in rehab.  She is now up to 2.7 mph on the treadmill!!  She is doing better with her wrist but also having some chest pain as noted in [facility] notes with cardiologist.   Expected Outcomes Short: Use RPE daily to regulate intensity. Long: Follow program prescription in THR. Short: Increase time at home to 30 min Long: Continue to improve strength and stamina. Short: Try level 3 on XR and walk on vacation Long: Continue to improve  stamina. Short: continue to attend consistently Long: increase MET levels Short: Figure out chest discomfort Long: Continue to improve stamina.   [place] Name 06/13/20 1429             Exercise Goal Re-Evaluation   Exercise Goals Review Increase Physical Activity;Increase Strength and Stamina;Understanding of Exercise Prescription       Comments Tarisha continues to progress well and has increased speed and grade on TM.  Staff will monitor progress.       Expected Outcomes Short:  continue to progress workloads Long: increase stamina              Discharge Exercise Prescription (Final Exercise Prescription Changes):  Exercise Prescription Changes - 06/13/20 1400      Response to Exercise   Blood Pressure (Admit) 142/72    Blood Pressure (Exercise) 132/54    Blood Pressure (Exit) 146/70    Heart Rate (Admit) 77 bpm    Heart Rate (Exercise) 117 bpm    Heart  Rate (Exit) 80 bpm    Rating of Perceived Exertion (Exercise) 13    Symptoms none    Duration Continue with 30 min of aerobic exercise without signs/symptoms of physical distress.    Intensity THRR unchanged      Progression   Progression Continue to progress workloads to maintain intensity without signs/symptoms of physical distress.    Average METs 5.22      Resistance Training   Training Prescription Yes    Weight 3lbs (right), 1 lb (left)    Reps 10-15      Interval Training   Interval Training No      Treadmill   MPH 3    Grade 3    Minutes 15    METs 4.54      REL-XR   Level 3    Minutes 15    METs 5.9      Home Exercise Plan   Plans to continue exercise at Home (comment)   walking, biking   Frequency Add 2 additional days to program exercise sessions.    Initial Home Exercises Provided 04/27/20           Nutrition:  Target Goals: Understanding of nutrition guidelines, daily intake of sodium <1563m, cholesterol <2026m calories 30% from fat and 7% or less from saturated fats, daily to have 5 or more  servings of fruits and vegetables.  Education: Controlling Sodium/Reading Food Labels -Group verbal and written material supporting the discussion of sodium use in heart healthy nutrition. Review and explanation with models, verbal and written materials for utilization of the food label.   Education: General Nutrition Guidelines/Fats and Fiber: -Group instruction provided by verbal, written material, models and posters to present the general guidelines for heart healthy nutrition. Gives an explanation and review of dietary fats and fiber.   Biometrics:  Pre Biometrics - 04/17/20 1037      Pre Biometrics   Height 5' 3.75" (1.619 m)    Weight 132 lb 11.2 oz (60.2 kg)    BMI (Calculated) 22.96    Single Leg Stand 1.66 seconds            Nutrition Therapy Plan and Nutrition Goals:  Nutrition Therapy & Goals - 05/22/20 1002      Nutrition Therapy   Diet Heart healthy, T1DM diet    Protein (specify units) 50g    Fiber 25 grams    Whole Grain Foods 3 servings    Saturated Fats 12 max. grams    Fruits and Vegetables 5 servings/day    Sodium 1.5 grams      Personal Nutrition Goals   Nutrition Goal ST: continue with heart healthy changes LT: continue with heart healthy changes    Comments Discussed heart healthy eating. Pt reports doing research on heart healthy eating and understands heart healthy eaitng. Pt has been doing well with changes. Discussed variety and flavor.      Intervention Plan   Intervention Prescribe, educate and counsel regarding individualized specific dietary modifications aiming towards targeted core components such as weight, hypertension, lipid management, diabetes, heart failure and other comorbidities.;Nutrition handout(s) given to patient.    Expected Outcomes Short Term Goal: Understand basic principles of dietary content, such as calories, fat, sodium, cholesterol and nutrients.;Short Term Goal: A plan has been developed with personal nutrition goals set  during dietitian appointment.;Long Term Goal: Adherence to prescribed nutrition plan.           Nutrition Assessments:  Nutrition Assessments - 04/11/20 1121  MEDFICTS Scores   Pre Score 3           MEDIFICTS Score Key:          ?70 Need to make dietary changes          40-70 Heart Healthy Diet         ? 40 Therapeutic Level Cholesterol Diet  Nutrition Goals Re-Evaluation:  Nutrition Goals Re-Evaluation    [place] Name 04/27/20 0816             Goals   Comment Will meet with dietician once both back from vacation.              Nutrition Goals Discharge (Final Nutrition Goals Re-Evaluation):  Nutrition Goals Re-Evaluation - 04/27/20 0816      Goals   Comment Will meet with dietician once both back from vacation.           Psychosocial: Target Goals: Acknowledge presence or absence of significant depression and/or stress, maximize coping skills, provide positive support system. Participant is able to verbalize types and ability to use techniques and skills needed for reducing stress and depression.   Education: Depression - Provides group verbal and written instruction on the correlation between heart/lung disease and depressed mood, treatment options, and the stigmas associated with seeking treatment.   Cardiac Rehab from 06/08/2020 in [facility] Cardiac and Pulmonary Rehab  Date 06/08/20  Educator [facility]  Instruction Review Code 1- Verbalizes Understanding      Education: Sleep Hygiene -Provides group verbal and written instruction about how sleep can affect your health.  Define sleep hygiene, discuss sleep cycles and impact of sleep habits. Review good sleep hygiene tips.     Education: Stress and Anxiety: - Provides group verbal and written instruction about the health risks of elevated stress and causes of high stress.  Discuss the correlation between heart/lung disease and anxiety and treatment options. Review healthy ways to manage with stress and anxiety.    Cardiac Rehab from 06/08/2020 in [facility] Cardiac and Pulmonary Rehab  Date 06/08/20  Educator [facility]  Instruction Review Code 1- Verbalizes Understanding       Initial Review & Psychosocial Screening:  Initial Psych Review & Screening - 04/10/20 1414      Initial Review   Current issues with Current Stress Concerns;Current Sleep Concerns    Source of Stress Concerns Unable to participate in former interests or hobbies;Unable to perform yard/household activities;Occupation    Comments Averages about 4-5 hours of sleep each night post surgery, eager to return to work      [place]? Yes   husband, daughter, mother, and granddaughter   Concerns No support system      Barriers   Psychosocial barriers to participate in program The patient should benefit from training in stress management and relaxation.;Psychosocial barriers identified (see note)      Screening Interventions   Interventions Encouraged to exercise;To provide support and resources with identified psychosocial needs;Provide feedback about the scores to participant    Expected Outcomes Short Term goal: Utilizing psychosocial counselor, staff and physician to assist with identification of specific Stressors or current issues interfering with healing process. Setting desired goal for each stressor or current issue identified.;Long Term Goal: Stressors or current issues are controlled or eliminated.;Short Term goal: Identification and review with participant of any Quality of Life or Depression concerns found by scoring the questionnaire.;Long Term goal: The participant improves quality of Life and PHQ9 Scores as seen by  post scores and/or verbalization of changes           Quality of Life Scores:   Quality of Life - 04/11/20 1120      Quality of Life   Select Quality of Life      Quality of Life Scores   Health/Function Pre 22.1 %    Socioeconomic Pre 15.68 %    Psych/Spiritual Pre 26.78 %    Family  Pre 10.8 %    GLOBAL Pre 19.95 %          Scores of 19 and below usually indicate a poorer quality of life in these areas.  A difference of  2-3 points is a clinically meaningful difference.  A difference of 2-3 points in the total score of the Quality of Life Index has been associated with significant improvement in overall quality of life, self-image, physical symptoms, and general health in studies assessing change in quality of life.  PHQ-9: Recent Review Flowsheet Data    Depression screen [facility] 2/9 04/17/2020 02/07/2020   Decreased Interest 0 0   Down, Depressed, Hopeless 0 0   PHQ - 2 Score 0 0   Altered sleeping 1 2   Tired, decreased energy 0 2   Change in appetite 0 0   Feeling bad or failure about yourself  0 0   Trouble concentrating 0 2   Moving slowly or fidgety/restless 0 0   Suicidal thoughts 0 0   PHQ-9 Score 1 6   Difficult doing work/chores Not difficult at all -     Interpretation of Total Score  Total Score Depression Severity:  1-4 = Minimal depression, 5-9 = Mild depression, 10-14 = Moderate depression, 15-19 = Moderately severe depression, 20-27 = Severe depression   Psychosocial Evaluation and Intervention:  Psychosocial Evaluation - 04/27/20 0813      Psychosocial Evaluation & Interventions   Interventions Encouraged to exercise with the program and follow exercise prescription;Stress management education    Comments Averages about 4-5 hours of sleep each night post surgery, eager to return to work.  Should benefit from exercise.    Expected Outcomes Exercise an dimprove sleep    Continue Psychosocial Services  Follow up required by staff           Psychosocial Re-Evaluation:  Psychosocial Re-Evaluation    Wallace Name 04/27/20 [phone]             Psychosocial Re-Evaluation   Current issues with Current Sleep Concerns       Comments Bernadean is doing well mentally.  Her sleep continues to be her biggest issue as it is very sporadic and broken.  She  usually gets about 4-5 hours.  She is headed out for vacation next week to [place] to get away for a while.       Expected Outcomes Short: Enjoy vacation Long: Continue to work on sleep.       Interventions Encouraged to attend Cardiac Rehabilitation for the exercise;Relaxation education       Continue Psychosocial Services  Follow up required by staff              Psychosocial Discharge (Final Psychosocial Re-Evaluation):  Psychosocial Re-Evaluation - 04/27/20 0814      Psychosocial Re-Evaluation   Current issues with Current Sleep Concerns    Comments Kioni is doing well mentally.  Her sleep continues to be her biggest issue as it is very sporadic and broken.  She usually gets about 4-5 hours.  She  is headed out for vacation next week to [place] to get away for a while.    Expected Outcomes Short: Enjoy vacation Long: Continue to work on sleep.    Interventions Encouraged to attend Cardiac Rehabilitation for the exercise;Relaxation education    Continue Psychosocial Services  Follow up required by staff           Vocational Rehabilitation: Provide vocational rehab assistance to qualifying candidates.   Vocational Rehab Evaluation & Intervention:  Vocational Rehab - 04/10/20 1412      Initial Vocational Rehab Evaluation & Intervention   Assessment shows need for Vocational Rehabilitation No           Education: Education Goals: Education classes will be provided on a variety of topics geared toward better understanding of heart health and risk factor modification. Participant will state understanding/return demonstration of topics presented as noted by education test scores.  Learning Barriers/Preferences:  Learning Barriers/Preferences - 04/10/20 1412      Learning Barriers/Preferences   Learning Barriers Sight   glasses for reading   Learning Preferences Skilled Demonstration           General Cardiac Education Topics:  AED/CPR: - Group verbal and written  instruction with the use of models to demonstrate the basic use of the AED with the basic ABC's of resuscitation.   Anatomy & Physiology of the Heart: - Group verbal and written instruction and models provide basic cardiac anatomy and physiology, with the coronary electrical and arterial systems. Review of Valvular disease and Heart Failure   Cardiac Procedures: - Group verbal and written instruction to review commonly prescribed medications for heart disease. Reviews the medication, class of the drug, and side effects. Includes the steps to properly store meds and maintain the prescription regimen. (beta blockers and nitrates)   Cardiac Medications I: - Group verbal and written instruction to review commonly prescribed medications for heart disease. Reviews the medication, class of the drug, and side effects. Includes the steps to properly store meds and maintain the prescription regimen.   Cardiac Rehab from 06/08/2020 in [facility] Cardiac and Pulmonary Rehab  Date 06/01/20  Educator SB  Instruction Review Code 1- Verbalizes Understanding      Cardiac Medications II: -Group verbal and written instruction to review commonly prescribed medications for heart disease. Reviews the medication, class of the drug, and side effects. (all other drug classes)    Go Sex-Intimacy & Heart Disease, Get SMART - Goal Setting: - Group verbal and written instruction through game format to discuss heart disease and the return to sexual intimacy. Provides group verbal and written material to discuss and apply goal setting through the application of the S.M.A.R.T. Method.   Other Matters of the Heart: - Provides group verbal, written materials and models to describe Stable Angina and Peripheral Artery. Includes description of the disease process and treatment options available to the cardiac patient.   Infection Prevention: - Provides verbal and written material to individual with discussion of infection  control including proper hand washing and proper equipment cleaning during exercise session.   Cardiac Rehab from 06/08/2020 in [facility] Cardiac and Pulmonary Rehab  Date 04/17/20  Educator [facility]  Instruction Review Code 1- Verbalizes Understanding      Falls Prevention: - Provides verbal and written material to individual with discussion of falls prevention and safety.   Cardiac Rehab from 06/08/2020 in [facility] Cardiac and Pulmonary Rehab  Date 04/17/20  Educator [facility]  Instruction Review Code 1- Verbalizes Understanding  Other: -Provides group and verbal instruction on various topics (see comments)   Knowledge Questionnaire Score:  Knowledge Questionnaire Score - 04/11/20 1121      Knowledge Questionnaire Score   Pre Score 23/26 Education Focus: Angina, Nutrition, Exercise           Core Components/Risk Factors/Patient Goals at Admission:  Personal Goals and Risk Factors at Admission - 04/17/20 1037      Core Components/Risk Factors/Patient Goals on Admission    Weight Management Yes;Weight Maintenance    Intervention Weight Management: Develop a combined nutrition and exercise program designed to reach desired caloric intake, while maintaining appropriate intake of nutrient and fiber, sodium and fats, and appropriate energy expenditure required for the weight goal.;Weight Management: Provide education and appropriate resources to help participant work on and attain dietary goals.    Admit Weight 132 lb 11.2 oz (60.2 kg)    Goal Weight: Short Term 130 lb (59 kg)    Goal Weight: Long Term 130 lb (59 kg)    Expected Outcomes Short Term: Continue to assess and modify interventions until short term weight is achieved;Long Term: Adherence to nutrition and physical activity/exercise program aimed toward attainment of established weight goal;Weight Maintenance: Understanding of the daily nutrition guidelines, which includes 25-35% calories from fat, 7% or less cal from saturated fats, less than  210m cholesterol, less than 1.5gm of sodium, & 5 or more servings of fruits and vegetables daily    Diabetes Yes    Intervention Provide education about signs/symptoms and action to take for hypo/hyperglycemia.;Provide education about proper nutrition, including hydration, and aerobic/resistive exercise prescription along with prescribed medications to achieve blood glucose in normal ranges: Fasting glucose 65-99 mg/dL    Expected Outcomes Short Term: Participant verbalizes understanding of the signs/symptoms and immediate care of hyper/hypoglycemia, proper foot care and importance of medication, aerobic/resistive exercise and nutrition plan for blood glucose control.;Long Term: Attainment of HbA1C < 7%.    Hypertension Yes    Intervention Provide education on lifestyle modifcations including regular physical activity/exercise, weight management, moderate sodium restriction and increased consumption of fresh fruit, vegetables, and low fat dairy, alcohol moderation, and smoking cessation.;Monitor prescription use compliance.    Expected Outcomes Short Term: Continued assessment and intervention until BP is < 140/931mHG in hypertensive participants. < 130/8081mG in hypertensive participants with diabetes, heart failure or chronic kidney disease.;Long Term: Maintenance of blood pressure at goal levels.    Lipids Yes    Intervention Provide education and support for participant on nutrition & aerobic/resistive exercise along with prescribed medications to achieve LDL <60m64mDL >40mg64m Expected Outcomes Short Term: Participant states understanding of desired cholesterol values and is compliant with medications prescribed. Participant is following exercise prescription and nutrition guidelines.;Long Term: Cholesterol controlled with medications as prescribed, with individualized exercise RX and with personalized nutrition plan. Value goals: LDL < 60mg,39m > 40 mg.           Education:Diabetes -  Individual verbal and written instruction to review signs/symptoms of diabetes, desired ranges of glucose level fasting, after meals and with exercise. Acknowledge that pre and post exercise glucose checks will be done for 3 sessions at entry of program.   [facility]  Date 04/10/20  [facility]- Verbalizes Understanding      Education: Know Your Numbers and Risk Factors: -Group verbal and written instruction about important numbers in your health.  Discussion  of what are risk factors and how they play a role in the disease process.  Review of Cholesterol, Blood Pressure, Diabetes, and BMI and the role they play in your overall health.   Core Components/Risk Factors/Patient Goals Review:   Goals and Risk Factor Review    Row Name 04/27/20 0816 06/01/20 0804           Core Components/Risk Factors/Patient Goals Review   Personal Goals Review Weight Management/Obesity;Diabetes;Hypertension Weight Management/Obesity;Diabetes;Hypertension      Review Tahani is doing well in rehab.  Her weight has been steady. Her blood sugars have been great and she checks them 5-6x a day. She is waiting for her hand to heal to be able use her new machine. She checks her blood pressures daily and has noticed that it is low first thing in the morning.  She was encouraged to message her doctor about it. Debras weight is still steady.  Her machine tells her the 30 day average blood sugar is 136.  She is having a nerve test next week for her wrist.  BP is still lower in the morning - around 790 systolic.  She can tell it takes her a little longer to get going when it is that low.  She states she is not on BP meds.  She has an echo week after next.  She has had some pain/brusing around sternum and has spoken with her Dr.      Noberto Retort Outcomes Short: Continue to monitor pressures and sugars Long: Continue to montior risk factors. Short: follow  up with echo/ monitor risk factors Long: manage risk factors long term             Core Components/Risk Factors/Patient Goals at Discharge (Final Review):   Goals and Risk Factor Review - 06/01/20 0804      Core Components/Risk Factors/Patient Goals Review   Personal Goals Review Weight Management/Obesity;Diabetes;Hypertension    Review Debras weight is still steady.  Her machine tells her the 30 day average blood sugar is 136.  She is having a nerve test next week for her wrist.  BP is still lower in the morning - around 240 systolic.  She can tell it takes her a little longer to get going when it is that low.  She states she is not on BP meds.  She has an echo week after next.  She has had some pain/brusing around sternum and has spoken with her Dr.    Noberto Retort Outcomes Short: follow up with echo/ monitor risk factors Long: manage risk factors long term           ITP Comments:  ITP Comments    Row Name 04/10/20 1420 04/17/20 1030 04/18/20 0949 04/19/20 0641 05/17/20 1105   ITP Comments Completed virtual orientation today.  EP evaluation is scheduled for Mon 6/14 at 930.  Documentation for diagnosis can be found in [facility] encounter 4/2, 4/19, 4/25. Completed 6MWT and gym orientation.  Initial ITP created and sent for review to Dr. Emily Filbert, Medical Director. First full day of exercise!  Patient was oriented to gym and equipment including functions, settings, policies, and procedures.  Patient's individual exercise prescription and treatment plan were reviewed.  All starting workloads were established based on the results of the 6 minute walk test done at initial orientation visit.  The plan for exercise progression was also introduced and progression will be customized based on patient's performance and goals. 30 Day review completed. Medical Director ITP review done,  changes made as directed, and signed approval by Medical Director. 30 Day review completed. Medical Director ITP review done,  changes made as directed, and signed approval by Medical Director.   Row Name 05/22/20 1029 05/30/20 1442 06/14/20 0635       ITP Comments Completed Initial RD Earnestine Mealing had been having some chest discomfort and contacted her doctor.  She had some lab work done today and scheduled for an echo on 8/9 for possible pericarditis. 30 Day review completed. Medical Director ITP review done, changes made as directed, and signed approval by Medical Director.            Comments:

## 2020-06-15 ENCOUNTER — Encounter: Payer: BC Managed Care – PPO | Admitting: *Deleted

## 2020-06-15 ENCOUNTER — Other Ambulatory Visit: Payer: Self-pay

## 2020-06-15 DIAGNOSIS — E785 Hyperlipidemia, unspecified: Secondary | ICD-10-CM | POA: Diagnosis not present

## 2020-06-15 DIAGNOSIS — Z794 Long term (current) use of insulin: Secondary | ICD-10-CM | POA: Diagnosis not present

## 2020-06-15 DIAGNOSIS — Z87442 Personal history of urinary calculi: Secondary | ICD-10-CM | POA: Diagnosis not present

## 2020-06-15 DIAGNOSIS — E10319 Type 1 diabetes mellitus with unspecified diabetic retinopathy without macular edema: Secondary | ICD-10-CM | POA: Diagnosis not present

## 2020-06-15 DIAGNOSIS — K219 Gastro-esophageal reflux disease without esophagitis: Secondary | ICD-10-CM | POA: Diagnosis not present

## 2020-06-15 DIAGNOSIS — Z87440 Personal history of urinary (tract) infections: Secondary | ICD-10-CM | POA: Diagnosis not present

## 2020-06-15 DIAGNOSIS — Z951 Presence of aortocoronary bypass graft: Secondary | ICD-10-CM | POA: Diagnosis not present

## 2020-06-15 DIAGNOSIS — I252 Old myocardial infarction: Secondary | ICD-10-CM | POA: Diagnosis not present

## 2020-06-15 DIAGNOSIS — I214 Non-ST elevation (NSTEMI) myocardial infarction: Secondary | ICD-10-CM

## 2020-06-15 DIAGNOSIS — Z7982 Long term (current) use of aspirin: Secondary | ICD-10-CM | POA: Diagnosis not present

## 2020-06-15 DIAGNOSIS — I2510 Atherosclerotic heart disease of native coronary artery without angina pectoris: Secondary | ICD-10-CM | POA: Diagnosis not present

## 2020-06-15 DIAGNOSIS — Z79899 Other long term (current) drug therapy: Secondary | ICD-10-CM | POA: Diagnosis not present

## 2020-06-15 NOTE — Progress Notes (Signed)
Daily Session Note  Patient Details  Name: Felicia [name] MRN: [id number] Date of Birth: 22-Feb-1961 Referring Provider:     Cardiac Rehab from 04/17/2020 in [facility] Cardiac and Pulmonary Rehab  Referring Provider Melodie Bouillon MD   [facility] Cardiologist: Dr. Arelia Sneddon Croitoru]      Encounter Date: 06/15/2020  Check In:  Session Check In - 06/15/20 0834      Check-In   Supervising physician immediately available to respond to emergencies See telemetry face sheet for immediately available ER MD    Location [facility]-Cardiac & Pulmonary Rehab    Staff Present Renita Papa, RN BSN;Melissa Caiola RDN, Rowe Pavy, BA, ACSM CEP, Exercise Physiologist    Virtual Visit No    Medication changes reported     No    Fall or balance concerns reported    No    Warm-up and Cool-down Performed on first and last piece of equipment    Resistance Training Performed Yes    VAD Patient? No    PAD/SET Patient? No      Pain Assessment   Currently in Pain? No/denies              Social History   Tobacco Use  Smoking Status Never Smoker  Smokeless Tobacco Never Used    Goals Met:  Independence with exercise equipment Exercise tolerated well No report of cardiac concerns or symptoms Strength training completed today  Goals Unmet:  Not Applicable  Comments: Pt able to follow exercise prescription today without complaint.  Will continue to monitor for progression.    Dr. Emily Filbert is Medical Director for [name] [name].

## 2020-06-19 DIAGNOSIS — G5603 Carpal tunnel syndrome, bilateral upper limbs: Secondary | ICD-10-CM | POA: Diagnosis not present

## 2020-06-20 ENCOUNTER — Other Ambulatory Visit: Payer: Self-pay

## 2020-06-20 ENCOUNTER — Encounter: Payer: BC Managed Care – PPO | Admitting: *Deleted

## 2020-06-20 DIAGNOSIS — Z951 Presence of aortocoronary bypass graft: Secondary | ICD-10-CM

## 2020-06-20 DIAGNOSIS — I252 Old myocardial infarction: Secondary | ICD-10-CM | POA: Diagnosis not present

## 2020-06-20 DIAGNOSIS — I2510 Atherosclerotic heart disease of native coronary artery without angina pectoris: Secondary | ICD-10-CM | POA: Diagnosis not present

## 2020-06-20 DIAGNOSIS — E785 Hyperlipidemia, unspecified: Secondary | ICD-10-CM | POA: Diagnosis not present

## 2020-06-20 DIAGNOSIS — Z7982 Long term (current) use of aspirin: Secondary | ICD-10-CM | POA: Diagnosis not present

## 2020-06-20 DIAGNOSIS — Z87440 Personal history of urinary (tract) infections: Secondary | ICD-10-CM | POA: Diagnosis not present

## 2020-06-20 DIAGNOSIS — Z794 Long term (current) use of insulin: Secondary | ICD-10-CM | POA: Diagnosis not present

## 2020-06-20 DIAGNOSIS — Z87442 Personal history of urinary calculi: Secondary | ICD-10-CM | POA: Diagnosis not present

## 2020-06-20 DIAGNOSIS — K219 Gastro-esophageal reflux disease without esophagitis: Secondary | ICD-10-CM | POA: Diagnosis not present

## 2020-06-20 DIAGNOSIS — Z79899 Other long term (current) drug therapy: Secondary | ICD-10-CM | POA: Diagnosis not present

## 2020-06-20 DIAGNOSIS — E10319 Type 1 diabetes mellitus with unspecified diabetic retinopathy without macular edema: Secondary | ICD-10-CM | POA: Diagnosis not present

## 2020-06-20 NOTE — Progress Notes (Signed)
Daily Session Note  Patient Details  Name: HAMDA KLUTTS MRN: [id number] Date of Birth: [birth year] Referring Provider:  2   Cardiac Rehab from 04/17/2020 in [facility] Cardiac and Pulmonary Rehab  Referring Provider Melodie Bouillon MD   [facility] Cardiologist: Dr. Arelia Sneddon Croitoru]      Encounter Date: 06/20/2020  Check In:  Session Check In - 06/20/20 0811      Check-In   Supervising physician immediately available to respond to emergencies See telemetry face sheet for immediately available ER MD    Location [facility]-Cardiac & Pulmonary Rehab    Staff Present Heath Lark, RN, BSN, Jacklynn Bue, MS Exercise Physiologist;Joseph Tessie Fass RCP,RRT,BSRT    Virtual Visit No    Medication changes reported     No    Fall or balance concerns reported    No    Warm-up and Cool-down Performed on first and last piece of equipment    Resistance Training Performed Yes    VAD Patient? No    PAD/SET Patient? No      Pain Assessment   Currently in Pain? No/denies              Social History   Tobacco Use  Smoking Status Never Smoker  Smokeless Tobacco Never Used    Goals Met:  Independence with exercise equipment Exercise tolerated well No report of cardiac concerns or symptoms  Goals Unmet:  Not Applicable  Comments: Pt able to follow exercise prescription today without complaint.  Will continue to monitor for progression.    Dr. Emily Filbert is Medical Director for [place] [name].

## 2020-06-21 ENCOUNTER — Other Ambulatory Visit: Payer: Self-pay

## 2020-06-21 ENCOUNTER — Encounter: Payer: BC Managed Care – PPO | Admitting: *Deleted

## 2020-06-21 DIAGNOSIS — E785 Hyperlipidemia, unspecified: Secondary | ICD-10-CM | POA: Diagnosis not present

## 2020-06-21 DIAGNOSIS — K219 Gastro-esophageal reflux disease without esophagitis: Secondary | ICD-10-CM | POA: Diagnosis not present

## 2020-06-21 DIAGNOSIS — Z951 Presence of aortocoronary bypass graft: Secondary | ICD-10-CM | POA: Diagnosis not present

## 2020-06-21 DIAGNOSIS — I2510 Atherosclerotic heart disease of native coronary artery without angina pectoris: Secondary | ICD-10-CM | POA: Diagnosis not present

## 2020-06-21 DIAGNOSIS — Z87442 Personal history of urinary calculi: Secondary | ICD-10-CM | POA: Diagnosis not present

## 2020-06-21 DIAGNOSIS — I252 Old myocardial infarction: Secondary | ICD-10-CM | POA: Diagnosis not present

## 2020-06-21 DIAGNOSIS — Z794 Long term (current) use of insulin: Secondary | ICD-10-CM | POA: Diagnosis not present

## 2020-06-21 DIAGNOSIS — E10319 Type 1 diabetes mellitus with unspecified diabetic retinopathy without macular edema: Secondary | ICD-10-CM | POA: Diagnosis not present

## 2020-06-21 DIAGNOSIS — Z87440 Personal history of urinary (tract) infections: Secondary | ICD-10-CM | POA: Diagnosis not present

## 2020-06-21 DIAGNOSIS — Z79899 Other long term (current) drug therapy: Secondary | ICD-10-CM | POA: Diagnosis not present

## 2020-06-21 DIAGNOSIS — Z7982 Long term (current) use of aspirin: Secondary | ICD-10-CM | POA: Diagnosis not present

## 2020-06-21 DIAGNOSIS — I214 Non-ST elevation (NSTEMI) myocardial infarction: Secondary | ICD-10-CM

## 2020-06-21 NOTE — Progress Notes (Signed)
Daily Session Note  Patient Details  Name: Felicia [name] MRN: [id number] Date of Birth: 1960-12-25 Referring Provider:     Cardiac Rehab from 04/17/2020 in [facility] Cardiac and Pulmonary Rehab  Referring Provider Melodie Bouillon MD   [facility] Cardiologist: Dr. Arelia Sneddon Croitoru]      Encounter Date: 06/21/2020  Check In:  Session Check In - 06/21/20 0911      Check-In   Supervising physician immediately available to respond to emergencies See telemetry face sheet for immediately available ER MD    Location [facility]-Cardiac & Pulmonary Rehab    Staff Present Heath Lark, RN, BSN, CCRP;Jessica Bayard, MA, RCEP, CCRP, CCET;Joseph [employer], [place], ACSM CEP, Exercise Physiologist    Virtual Visit No    Medication changes reported     No    Fall or balance concerns reported    No    Warm-up and Cool-down Performed on first and last piece of equipment    Resistance Training Performed Yes    VAD Patient? No    PAD/SET Patient? No      Pain Assessment   Currently in Pain? No/denies              Social History   Tobacco Use  Smoking Status Never Smoker  Smokeless Tobacco Never Used    Goals Met:  Independence with exercise equipment Exercise tolerated well No report of cardiac concerns or symptoms  Goals Unmet:  Not Applicable  Comments: Pt able to follow exercise prescription today without complaint.  Will continue to monitor for progression.    Dr. Emily Filbert is Medical Director for [place] and [name].

## 2020-06-22 DIAGNOSIS — G5602 Carpal tunnel syndrome, left upper limb: Secondary | ICD-10-CM | POA: Diagnosis not present

## 2020-06-26 ENCOUNTER — Encounter: Payer: Self-pay | Admitting: *Deleted

## 2020-06-28 ENCOUNTER — Other Ambulatory Visit: Payer: Self-pay

## 2020-06-28 ENCOUNTER — Encounter: Payer: BC Managed Care – PPO | Admitting: *Deleted

## 2020-06-28 DIAGNOSIS — I214 Non-ST elevation (NSTEMI) myocardial infarction: Secondary | ICD-10-CM

## 2020-06-28 DIAGNOSIS — Z79899 Other long term (current) drug therapy: Secondary | ICD-10-CM | POA: Diagnosis not present

## 2020-06-28 DIAGNOSIS — Z7982 Long term (current) use of aspirin: Secondary | ICD-10-CM | POA: Diagnosis not present

## 2020-06-28 DIAGNOSIS — I252 Old myocardial infarction: Secondary | ICD-10-CM | POA: Diagnosis not present

## 2020-06-28 DIAGNOSIS — Z951 Presence of aortocoronary bypass graft: Secondary | ICD-10-CM

## 2020-06-28 DIAGNOSIS — Z794 Long term (current) use of insulin: Secondary | ICD-10-CM | POA: Diagnosis not present

## 2020-06-28 DIAGNOSIS — Z87442 Personal history of urinary calculi: Secondary | ICD-10-CM | POA: Diagnosis not present

## 2020-06-28 DIAGNOSIS — E785 Hyperlipidemia, unspecified: Secondary | ICD-10-CM | POA: Diagnosis not present

## 2020-06-28 DIAGNOSIS — Z87440 Personal history of urinary (tract) infections: Secondary | ICD-10-CM | POA: Diagnosis not present

## 2020-06-28 DIAGNOSIS — E10319 Type 1 diabetes mellitus with unspecified diabetic retinopathy without macular edema: Secondary | ICD-10-CM | POA: Diagnosis not present

## 2020-06-28 DIAGNOSIS — K219 Gastro-esophageal reflux disease without esophagitis: Secondary | ICD-10-CM | POA: Diagnosis not present

## 2020-06-28 DIAGNOSIS — I2510 Atherosclerotic heart disease of native coronary artery without angina pectoris: Secondary | ICD-10-CM | POA: Diagnosis not present

## 2020-06-28 NOTE — Progress Notes (Signed)
Daily Session Note  Patient Details  Name: Felicia [name] MRN: [id number] Date of Birth: 01/20/1961 Referring Provider:     Cardiac Rehab from 04/17/2020 in [facility] Cardiac and Pulmonary Rehab  Referring Provider Melodie Bouillon MD   [facility] Cardiologist: Dr. Arelia Sneddon Croitoru]      Encounter Date: 06/28/2020  Check In:  Session Check In - 06/28/20 0806      Check-In   Supervising physician immediately available to respond to emergencies See telemetry face sheet for immediately available ER MD    Location [facility]-Cardiac & Pulmonary Rehab    Staff Present Heath Lark, RN, BSN, CCRP;Jessica [place], MA, RCEP, CCRP, Homer C Jones, [place], ACSM CEP, Exercise Physiologist;Kara Eliezer Bottom, MS Exercise Physiologist    Virtual Visit No    Medication changes reported     No    Fall or balance concerns reported    No    Warm-up and Cool-down Performed on first and last piece of equipment    Resistance Training Performed Yes    VAD Patient? No    PAD/SET Patient? No      Pain Assessment   Currently in Pain? No/denies              Social History   Tobacco Use  Smoking Status Never Smoker  Smokeless Tobacco Never Used    Goals Met:  Independence with exercise equipment Exercise tolerated well No report of cardiac concerns or symptoms  Goals Unmet:  Not Applicable  Comments: Pt able to follow exercise prescription today without complaint.  Will continue to monitor for progression.    Dr. Emily Filbert is Medical Director for [place] [name].

## 2020-06-29 ENCOUNTER — Encounter: Payer: BC Managed Care – PPO | Admitting: *Deleted

## 2020-06-29 ENCOUNTER — Other Ambulatory Visit: Payer: Self-pay

## 2020-06-29 DIAGNOSIS — E10319 Type 1 diabetes mellitus with unspecified diabetic retinopathy without macular edema: Secondary | ICD-10-CM | POA: Diagnosis not present

## 2020-06-29 DIAGNOSIS — Z79899 Other long term (current) drug therapy: Secondary | ICD-10-CM | POA: Diagnosis not present

## 2020-06-29 DIAGNOSIS — I2510 Atherosclerotic heart disease of native coronary artery without angina pectoris: Secondary | ICD-10-CM | POA: Diagnosis not present

## 2020-06-29 DIAGNOSIS — Z951 Presence of aortocoronary bypass graft: Secondary | ICD-10-CM

## 2020-06-29 DIAGNOSIS — Z87442 Personal history of urinary calculi: Secondary | ICD-10-CM | POA: Diagnosis not present

## 2020-06-29 DIAGNOSIS — Z87440 Personal history of urinary (tract) infections: Secondary | ICD-10-CM | POA: Diagnosis not present

## 2020-06-29 DIAGNOSIS — Z7982 Long term (current) use of aspirin: Secondary | ICD-10-CM | POA: Diagnosis not present

## 2020-06-29 DIAGNOSIS — I252 Old myocardial infarction: Secondary | ICD-10-CM | POA: Diagnosis not present

## 2020-06-29 DIAGNOSIS — Z794 Long term (current) use of insulin: Secondary | ICD-10-CM | POA: Diagnosis not present

## 2020-06-29 DIAGNOSIS — E785 Hyperlipidemia, unspecified: Secondary | ICD-10-CM | POA: Diagnosis not present

## 2020-06-29 DIAGNOSIS — K219 Gastro-esophageal reflux disease without esophagitis: Secondary | ICD-10-CM | POA: Diagnosis not present

## 2020-06-29 NOTE — Progress Notes (Signed)
Daily Session Note  Patient Details  Name: Felicia [name] MRN: [id number] Date of Birth: [birth year] Referring Provider:     Cardiac Rehab from 04/17/2020 in [facility] Cardiac and Pulmonary Rehab  Referring Provider Melodie Bouillon MD   [facility] Cardiologist: Dr. Arelia Sneddon Croitoru]      Encounter Date: 06/29/2020  Check In:  Session Check In - 06/29/20 0832      Check-In   Supervising physician immediately available to respond to emergencies See telemetry face sheet for immediately available ER MD    Location [facility]-Cardiac & Pulmonary Rehab    Staff Present Heath Lark, RN, BSN, CCRP;Melissa Caiola RDN, Rowe Pavy, BA, ACSM CEP, Exercise Physiologist    Virtual Visit No    Medication changes reported     No    Fall or balance concerns reported    No    Warm-up and Cool-down Performed on first and last piece of equipment    Resistance Training Performed Yes    VAD Patient? No    PAD/SET Patient? No      Pain Assessment   Currently in Pain? No/denies              Social History   Tobacco Use  Smoking Status Never Smoker  Smokeless Tobacco Never Used    Goals Met:  Independence with exercise equipment Exercise tolerated well No report of cardiac concerns or symptoms  Goals Unmet:  Not Applicable  Comments: Pt able to follow exercise prescription today without complaint.  Will continue to monitor for progression.    Dr. Emily Filbert is Medical Director for [place] [name].

## 2020-07-04 ENCOUNTER — Encounter: Payer: BC Managed Care – PPO | Admitting: *Deleted

## 2020-07-04 ENCOUNTER — Other Ambulatory Visit: Payer: Self-pay

## 2020-07-04 DIAGNOSIS — Z87442 Personal history of urinary calculi: Secondary | ICD-10-CM | POA: Diagnosis not present

## 2020-07-04 DIAGNOSIS — E785 Hyperlipidemia, unspecified: Secondary | ICD-10-CM | POA: Diagnosis not present

## 2020-07-04 DIAGNOSIS — Z794 Long term (current) use of insulin: Secondary | ICD-10-CM | POA: Diagnosis not present

## 2020-07-04 DIAGNOSIS — K219 Gastro-esophageal reflux disease without esophagitis: Secondary | ICD-10-CM | POA: Diagnosis not present

## 2020-07-04 DIAGNOSIS — I2510 Atherosclerotic heart disease of native coronary artery without angina pectoris: Secondary | ICD-10-CM | POA: Diagnosis not present

## 2020-07-04 DIAGNOSIS — I252 Old myocardial infarction: Secondary | ICD-10-CM | POA: Diagnosis not present

## 2020-07-04 DIAGNOSIS — Z87440 Personal history of urinary (tract) infections: Secondary | ICD-10-CM | POA: Diagnosis not present

## 2020-07-04 DIAGNOSIS — E10319 Type 1 diabetes mellitus with unspecified diabetic retinopathy without macular edema: Secondary | ICD-10-CM | POA: Diagnosis not present

## 2020-07-04 DIAGNOSIS — Z951 Presence of aortocoronary bypass graft: Secondary | ICD-10-CM

## 2020-07-04 DIAGNOSIS — Z7982 Long term (current) use of aspirin: Secondary | ICD-10-CM | POA: Diagnosis not present

## 2020-07-04 DIAGNOSIS — Z79899 Other long term (current) drug therapy: Secondary | ICD-10-CM | POA: Diagnosis not present

## 2020-07-04 NOTE — Progress Notes (Signed)
Daily Session Note  Patient Details  Name: Felicia [name] MRN: [id number] Date of Birth: April 08, 1961 Referring Provider:     Cardiac Rehab from 04/17/2020 in [facility] Re[facility]  Referring Provider Melodie Bouillon MD   [facility] Cardiologist: Dr. Arelia Sneddon Croitoru]      Encounter Date: 07/04/2020  Check In:  Session Check In - 07/04/20 1010      Check-In   Supervising physician immediately available to respond to emergencies See telemetry face sheet for immediately available ER MD    Location [facility]-Cardiac & Pulmonary Rehab    Staff Present Heath Lark, RN, BSN, Jacklynn Bue, MS Exercise Physiologist;Amanda Oletta Darter, [place], ACSM CEP, Exercise Physiologist    Virtual Visit No    Medication changes reported     No    Fall or balance concerns reported    No    Warm-up and Cool-down Performed on first and last piece of equipment    Resistance Training Performed Yes    VAD Patient? No    PAD/SET Patient? No      Pain Assessment   Currently in Pain? No/denies             6 Minute Walk    Row Name 04/17/20 1030 07/04/20 0811       6 Minute Walk   Phase Initial Discharge    Distance 940 feet 1518 feet    Distance % Change -- 61 %    Distance Feet Change -- 578 ft    Walk Time 6 minutes 6 minutes    # of Rest Breaks 0 0    MPH 1.78 2.88    METS 3.65 4.3    RPE 13 11    VO2 Peak 12.77 14.93    Symptoms No No    Resting HR 92 bpm 85 bpm    Resting BP 118/64 128/70    Resting Oxygen Saturation  99 % --    Exercise Oxygen Saturation  during 6 min walk 99 % --    Max Ex. HR 106 bpm 100 bpm    Max Ex. BP 186/82 148/60    2 Minute Post BP 154/82 --              Social History   Tobacco Use  Smoking Status Never Smoker  Smokeless Tobacco Never Used    Goals Met:  Independence with exercise equipment Exercise tolerated well No report of cardiac concerns or symptoms  Goals Unmet:  Not Applicable  Comments: Pt able to follow exercise prescription  today without complaint.  Will continue to monitor for progression.    Dr. Emily Filbert is Medical Director for [name].

## 2020-07-06 ENCOUNTER — Other Ambulatory Visit: Payer: Self-pay

## 2020-07-06 ENCOUNTER — Encounter: Payer: BC Managed Care – PPO | Attending: Cardiovascular Disease | Admitting: *Deleted

## 2020-07-06 DIAGNOSIS — Z79899 Other long term (current) drug therapy: Secondary | ICD-10-CM | POA: Diagnosis not present

## 2020-07-06 DIAGNOSIS — Z794 Long term (current) use of insulin: Secondary | ICD-10-CM | POA: Insufficient documentation

## 2020-07-06 DIAGNOSIS — I214 Non-ST elevation (NSTEMI) myocardial infarction: Secondary | ICD-10-CM

## 2020-07-06 DIAGNOSIS — E10319 Type 1 diabetes mellitus with unspecified diabetic retinopathy without macular edema: Secondary | ICD-10-CM | POA: Insufficient documentation

## 2020-07-06 DIAGNOSIS — Z87440 Personal history of urinary (tract) infections: Secondary | ICD-10-CM | POA: Diagnosis not present

## 2020-07-06 DIAGNOSIS — Z951 Presence of aortocoronary bypass graft: Secondary | ICD-10-CM | POA: Diagnosis not present

## 2020-07-06 DIAGNOSIS — Z87442 Personal history of urinary calculi: Secondary | ICD-10-CM | POA: Diagnosis not present

## 2020-07-06 DIAGNOSIS — I2510 Atherosclerotic heart disease of native coronary artery without angina pectoris: Secondary | ICD-10-CM | POA: Diagnosis not present

## 2020-07-06 DIAGNOSIS — K219 Gastro-esophageal reflux disease without esophagitis: Secondary | ICD-10-CM | POA: Insufficient documentation

## 2020-07-06 DIAGNOSIS — Z7982 Long term (current) use of aspirin: Secondary | ICD-10-CM | POA: Diagnosis not present

## 2020-07-06 DIAGNOSIS — E785 Hyperlipidemia, unspecified: Secondary | ICD-10-CM | POA: Insufficient documentation

## 2020-07-06 DIAGNOSIS — I252 Old myocardial infarction: Secondary | ICD-10-CM | POA: Diagnosis not present

## 2020-07-06 NOTE — Patient Instructions (Signed)
Discharge Patient Instructions  Patient Details  Name: Felicia [name] MRN: [id number] Date of Birth: 1961-10-09 Referring Provider:  Sanda Klein, MD   Number of Visits: 66  Reason for Discharge:  Patient reached a stable level of exercise. Patient independent in their exercise. Patient has met program and personal goals.  Smoking History:  Social History   Tobacco Use  Smoking Status Never Smoker  Smokeless Tobacco Never Used    Diagnosis:  S/P CABG x 5  NSTEMI (non-ST elevated myocardial infarction) [facility])  Initial Exercise Prescription:  Initial Exercise Prescription - 04/17/20 1000      Date of Initial Exercise RX and Referring Provider   Date 04/17/20    Referring Provider Melodie Bouillon MD    Primary Cardiologist: Dr. Arelia Sneddon Croitoru     Treadmill   MPH 1.7    Grade 0    Minutes 15    METs 2.3      REL-XR   Level 2    Speed 50    Minutes 15    METs 2.5      T5 Nustep   Level 3    SPM 80    Minutes 15    METs 2.5      Prescription Details   Frequency (times per week) 2    Duration Progress to 30 minutes of continuous aerobic without signs/symptoms of physical distress      Intensity   THRR 40-80% of Max Heartrate 120-149    Ratings of Perceived Exertion 11-13    Perceived Dyspnea 0-4      Progression   Progression Continue to progress workloads to maintain intensity without signs/symptoms of physical distress.      Resistance Training   Training Prescription Yes    Weight 3lbs (right), 1 lb (left)    Reps 10-15           Discharge Exercise Prescription (Final Exercise Prescription Changes):  Exercise Prescription Changes - 06/28/20 1300      Response to Exercise   Blood Pressure (Admit) 130/68    Blood Pressure (Exercise) 132/60    Blood Pressure (Exit) 122/64    Heart Rate (Admit) 77 bpm    Heart Rate (Exercise) 92 bpm    Heart Rate (Exit) 80 bpm    Rating of Perceived Exertion (Exercise) 12    Symptoms none    Duration  Continue with 30 min of aerobic exercise without signs/symptoms of physical distress.    Intensity THRR unchanged      Progression   Progression Continue to progress workloads to maintain intensity without signs/symptoms of physical distress.    Average METs 4.31      Resistance Training   Training Prescription Yes    Weight 3lbs (right), 1 lb (left)    Reps 10-15      Interval Training   Interval Training No      Treadmill   MPH 3    Grade 3.5    Minutes 15    METs 4.74      Elliptical   Level 1    Speed 2.5    Minutes 15    METs 2.3      REL-XR   Level 3    Minutes 15    METs 5.9      Home Exercise Plan   Plans to continue exercise at Home (comment)   walking, biking   Frequency Add 2 additional days to program exercise sessions.    Initial Home Exercises Provided  04/27/20           Functional Capacity:  6 Minute Walk    Row Name 04/17/20 1030 07/04/20 0811       6 Minute Walk   Phase Initial Discharge    Distance 940 feet 1518 feet    Distance % Change - 61 %    Distance Feet Change - 578 ft    Walk Time 6 minutes 6 minutes    # of Rest Breaks 0 0    MPH 1.78 2.88    METS 3.65 4.3    RPE 13 11    VO2 Peak 12.77 14.93    Symptoms No No    Resting HR 92 bpm 85 bpm    Resting BP 118/64 128/70    Resting Oxygen Saturation  99 % -    Exercise Oxygen Saturation  during 6 min walk 99 % -    Max Ex. HR 106 bpm 100 bpm    Max Ex. BP 186/82 148/60    2 Minute Post BP 154/82 -           Quality of Life:  Quality of Life - 07/06/20 0818      Quality of Life   Select Quality of Life      Quality of Life Scores   Health/Function Pre 22.1 %    Health/Function Post 27.73 %    Health/Function % Change 25.48 %    Socioeconomic Pre 15.68 %    Socioeconomic Post 25.36 %    Socioeconomic % Change  61.73 %    Psych/Spiritual Pre 26.78 %    Psych/Spiritual Post 28.57 %    Psych/Spiritual % Change 6.68 %    Family Pre 10.8 %    Family Post 29.5 %     Family % Change 173.15 %    GLOBAL Pre 19.95 %    GLOBAL Post 27.67 %    GLOBAL % Change 38.7 %            Nutrition & Weight - Outcomes:  Pre Biometrics - 04/17/20 1037      Pre Biometrics   Height 5' 3.75" (1.619 m)    Weight 132 lb 11.2 oz (60.2 kg)    BMI (Calculated) 22.96    Single Leg Stand 1.66 seconds            Nutrition:  Nutrition Therapy & Goals - 05/22/20 1002      Nutrition Therapy   Diet Heart healthy, T1DM diet    Protein (specify units) 50g    Fiber 25 grams    Whole Grain Foods 3 servings    Saturated Fats 12 max. grams    Fruits and Vegetables 5 servings/day    Sodium 1.5 grams      Personal Nutrition Goals   Nutrition Goal ST: continue with heart healthy changes LT: continue with heart healthy changes    Comments Discussed heart healthy eating. Pt reports doing research on heart healthy eating and understands heart healthy eaitng. Pt has been doing well with changes. Discussed variety and flavor.      Intervention Plan   Intervention Prescribe, educate and counsel regarding individualized specific dietary modifications aiming towards targeted core components such as weight, hypertension, lipid management, diabetes, heart failure and other comorbidities.;Nutrition handout(s) given to patient.    Expected Outcomes Short Term Goal: Understand basic principles of dietary content, such as calories, fat, sodium, cholesterol and nutrients.;Short Term Goal: A plan has been developed with personal  nutrition goals set during dietitian appointment.;Long Term Goal: Adherence to prescribed nutrition plan.           Goals reviewed with patient; copy given to patient.

## 2020-07-06 NOTE — Progress Notes (Signed)
Daily Session Note  Patient Details  Name: Felicia [name] MRN: [id number] Date of Birth: [birth year] Referring Provider:     Cardiac Rehab from 04/17/2020 in [facility] Cardiac and Pulmonary Rehab  Referring Provider Melodie Bouillon MD   [facility] Cardiologist: Dr. Arelia Sneddon Croitoru]      Encounter Date: 07/06/2020  Check In:  Session Check In - 07/06/20 0802      Check-In   Supervising physician immediately available to respond to emergencies See telemetry face sheet for immediately available ER MD    Location [facility]-Cardiac & Pulmonary Rehab    Staff Present Renita Papa, RN Margurite Auerbach, MS Exercise Physiologist;Odyn Turko Luan Pulling, MA, RCEP, CCRP, CCET;Melissa Socorro RDN, Rowe Pavy, [place], ACSM CEP, Exercise Physiologist    Virtual Visit No    Medication changes reported     No    Fall or balance concerns reported    No    Warm-up and Cool-down Performed on first and last piece of equipment    Resistance Training Performed Yes    VAD Patient? No    PAD/SET Patient? No      Pain Assessment   Currently in Pain? No/denies              Social History   Tobacco Use  Smoking Status Never Smoker  Smokeless Tobacco Never Used    Goals Met:  Independence with exercise equipment Exercise tolerated well No report of cardiac concerns or symptoms Strength training completed today  Goals Unmet:  Not Applicable  Comments: Pt able to follow exercise prescription today without complaint.  Will continue to monitor for progression.    Dr. Emily Filbert is Medical Director for [name].

## 2020-07-11 ENCOUNTER — Other Ambulatory Visit: Payer: Self-pay

## 2020-07-11 ENCOUNTER — Encounter: Payer: BC Managed Care – PPO | Admitting: *Deleted

## 2020-07-11 ENCOUNTER — Telehealth: Payer: Self-pay | Admitting: Cardiovascular Disease

## 2020-07-11 DIAGNOSIS — Z794 Long term (current) use of insulin: Secondary | ICD-10-CM | POA: Diagnosis not present

## 2020-07-11 DIAGNOSIS — E785 Hyperlipidemia, unspecified: Secondary | ICD-10-CM | POA: Diagnosis not present

## 2020-07-11 DIAGNOSIS — Z87442 Personal history of urinary calculi: Secondary | ICD-10-CM | POA: Diagnosis not present

## 2020-07-11 DIAGNOSIS — Z951 Presence of aortocoronary bypass graft: Secondary | ICD-10-CM | POA: Diagnosis not present

## 2020-07-11 DIAGNOSIS — Z7982 Long term (current) use of aspirin: Secondary | ICD-10-CM | POA: Diagnosis not present

## 2020-07-11 DIAGNOSIS — I252 Old myocardial infarction: Secondary | ICD-10-CM | POA: Diagnosis not present

## 2020-07-11 DIAGNOSIS — E10319 Type 1 diabetes mellitus with unspecified diabetic retinopathy without macular edema: Secondary | ICD-10-CM | POA: Diagnosis not present

## 2020-07-11 DIAGNOSIS — I2510 Atherosclerotic heart disease of native coronary artery without angina pectoris: Secondary | ICD-10-CM | POA: Diagnosis not present

## 2020-07-11 DIAGNOSIS — Z79899 Other long term (current) drug therapy: Secondary | ICD-10-CM | POA: Diagnosis not present

## 2020-07-11 DIAGNOSIS — I214 Non-ST elevation (NSTEMI) myocardial infarction: Secondary | ICD-10-CM

## 2020-07-11 DIAGNOSIS — K219 Gastro-esophageal reflux disease without esophagitis: Secondary | ICD-10-CM | POA: Diagnosis not present

## 2020-07-11 DIAGNOSIS — Z87440 Personal history of urinary (tract) infections: Secondary | ICD-10-CM | POA: Diagnosis not present

## 2020-07-11 NOTE — Progress Notes (Signed)
Cardiac Individual Treatment Plan  Patient Details  Name: Felicia [name] MRN: [id number] Date of Birth: Aug 01, 1961 Referring Provider:     Cardiac Rehab from 04/17/2020 in [facility] Cardiac and Pulmonary Rehab  Referring Provider Melodie Bouillon MD   [facility] Cardiologist: Dr. Arelia Sneddon Croitoru]      Initial Encounter Date:    Cardiac Rehab from 04/17/2020 in [facility] Cardiac and Pulmonary Rehab  Date 04/17/20      Visit Diagnosis: S/P CABG x 5  NSTEMI (non-ST elevated myocardial infarction) ([place])  Patient's Home Medications on Admission:  Current Outpatient Medications:  .  aspirin 81 MG EC tablet, Take 1 tablet (81 mg total) by mouth daily., Disp: , Rfl:  .  esomeprazole (NEXIUM) 40 MG capsule, Take 1 capsule (40 mg total) by mouth daily. Patient needs office visit for further refills, Disp: 90 capsule, Rfl: 0 .  Evolocumab (REPATHA SURECLICK) 037 MG/ML SOAJ, Inject 140 mg into the skin every 14 (fourteen) days., Disp: 2 pen, Rfl: 11 .  glucagon (GLUCAGON EMERGENCY) 1 MG injection, Inject 1 mg into the muscle once as needed (in case of severe hypoglycemia)., Disp: 1 each, Rfl: 12 .  Insulin Glargine (LANTUS SOLOSTAR) 100 UNIT/ML Solostar Pen, Inject 12 Units into the skin daily. (Patient taking differently: Inject 8-10 Units into the skin See admin instructions. Inject 10 units into the skin in the morning and 8 units in the evening), Disp: 5 pen, Rfl: 11 .  Insulin Pen Needle (NOVOFINE) 32G X 6 MM MISC, USE TO INJECT INSULIN 4 TIMES A DAY, Disp: 200 each, Rfl: 1 .  insulin regular (NOVOLIN R) 100 units/mL injection, INJECT 6 UNITS TOTAL INTO THE SKIN 3 (THREE) TIMES DAILY BEFORE MEALS. (Patient taking differently: Inject 1-10 Units into the skin See admin instructions. For glucose 150-210 add 1 additional unit, per scale 211-270= 2 units, 271-330= 3 units, 331-390= 4 units  For breakfast and dinner, administer 1 unit for every 10 grams of carbs. For snacks and dinner, administer 1 unit for every  20 grams of carbs.), Disp: 60 mL, Rfl: 1 .  Insulin Syringe-Needle U-100 30G X 5/16" 0.3 ML MISC, Use to inject insulin 6 times a day., Disp: 600 each, Rfl: 11 .  Multiple Vitamin (MULTIVITAMIN) tablet, Take 1 tablet by mouth daily.  , Disp: , Rfl:   Past Medical History: Past Medical History:  Diagnosis Date  . Allergy   . Anemia    past history long ago  . Borderline HTN (hypertension)   . CAD (coronary artery disease)    a. 02/2020 Cath: LM nl, LAD large, 90p, 61m D1 90p, LCX nl, OM 60-70p, RCA dominant w/ dampening upon engagement of ostium. 70p/m, RPDA 50. EF 55-65%.  . Disorder of kidney    has abd kidney (right)  . GERD (gastroesophageal reflux disease)   . History of diabetic gastroparesis   . History of echocardiogram    a. 01/2013 Echo: EF 55-65%, no rwma. Mild MR. Nl RV fxn.  .Marland KitchenHLD (hyperlipidemia)   . Hx: UTI (urinary tract infection)   . IBS (irritable bowel syndrome)   . Nephrolithiasis 2005  . Osteopenia 2012   per pt  . Systolic murmur 30/4888  mild mitral insuff  . Type 1 diabetes mellitus with diabetic retinopathy without macular edema (HCC)    dx at 59y/o, background retinopathy    Tobacco Use: Social History   Tobacco Use  Smoking Status Never Smoker  Smokeless Tobacco Never Used    Labs:  Recent Review Flowsheet Data    Labs for ITP Cardiac and Pulmonary Rehab Latest Ref Rng & Units 02/21/2020 02/21/2020 02/21/2020 02/21/2020 06/09/2020   Cholestrol 0 - 200 mg/dL - - - - 142   LDLCALC 0 - 99 mg/dL - - - - 59   LDLDIRECT mg/dL - - - - -   HDL >39.00 mg/dL - - - - 50.70   Trlycerides 0 - 149 mg/dL - - - - 163.0(H)   Hemoglobin A1c 4.8 - 5.6 % - - - - -   PHART 7.35 - 7.45 7.485(H) 7.344(L) 7.292(L) 7.314(L) -   PCO2ART 32 - 48 mmHg 28.8(L) 38.7 43.3 46.7 -   HCO3 20.0 - 28.0 mmol/L 21.7 21.1 20.7 23.3 -   TCO2 22 - 32 mmol/L 23 22 22 25  -   ACIDBASEDEF 0.0 - 2.0 mmol/L 1.0 4.0(H) 5.0(H) 3.0(H) -   O2SAT % 98.0 99.0 88.0 100.0 -       Exercise  Target Goals: Exercise Program Goal: Individual exercise prescription set using results from initial 6 min walk test and THRR while considering  patient's activity barriers and safety.   Exercise Prescription Goal: Initial exercise prescription builds to 30-45 minutes a day of aerobic activity, 2-3 days per week.  Home exercise guidelines will be given to patient during program as part of exercise prescription that the participant will acknowledge.   Education: Aerobic Exercise & Resistance Training: - Gives group verbal and written instruction on the various components of exercise. Focuses on aerobic and resistive training programs and the benefits of this training and how to safely progress through these programs..   Cardiac Rehab from 07/06/2020 in [facility] Cardiac and Pulmonary Rehab  Date 06/21/20  Educator [facility]  Instruction Review Code 1- Verbalizes Understanding      Education: Exercise & Equipment Safety: - Individual verbal instruction and demonstration of equipment use and safety with use of the equipment.   Cardiac Rehab from 07/06/2020 in [facility] Cardiac and Pulmonary Rehab  Date 04/17/20  Educator [facility]  Instruction Review Code 1- Verbalizes Understanding      Education: Exercise Physiology & General Exercise Guidelines: - Group verbal and written instruction with models to review the exercise physiology of the cardiovascular system and associated critical values. Provides general exercise guidelines with specific guidelines to those with heart or lung disease.    Cardiac Rehab from 07/06/2020 in [facility] Cardiac and Pulmonary Rehab  Date 05/18/20  Educator [facility]  Instruction Review Code 1- Verbalizes Understanding      Education: Flexibility, Balance, Mind/Body Relaxation: Provides group verbal/written instruction on the benefits of flexibility and balance training, including mind/body exercise modes such as yoga, pilates and tai chi.  Demonstration and skill practice provided.   Cardiac  Rehab from 07/06/2020 in [facility] Cardiac and Pulmonary Rehab  Date 07/06/20  Educator [facility]  Instruction Review Code 1- Verbalizes Understanding      Activity Barriers & Risk Stratification:  Activity Barriers & Cardiac Risk Stratification - 04/17/20 1031      Activity Barriers & Cardiac Risk Stratification   Activity Barriers Deconditioning;Incisional Pain;Muscular Weakness;Joint Problems;Balance Concerns;Other (comment);History of Falls;Back Problems;Neck/Spine Problems    Comments no heavy weights for 3 more weeks    Cardiac Risk Stratification Moderate           6 Minute Walk:  6 Minute Walk    Row Name 04/17/20 1030 07/04/20 0811       6 Minute Walk   Phase Initial Discharge    Distance  940 feet 1518 feet    Distance % Change -- 61 %    Distance Feet Change -- 578 ft    Walk Time 6 minutes 6 minutes    # of Rest Breaks 0 0    MPH 1.78 2.88    METS 3.65 4.3    RPE 13 11    VO2 Peak 12.77 14.93    Symptoms No No    Resting HR 92 bpm 85 bpm    Resting BP 118/64 128/70    Resting Oxygen Saturation  99 % --    Exercise Oxygen Saturation  during 6 min walk 99 % --    Max Ex. HR 106 bpm 100 bpm    Max Ex. BP 186/82 148/60    2 Minute Post BP 154/82 --           Oxygen Initial Assessment:   Oxygen Re-Evaluation:   Oxygen Discharge (Final Oxygen Re-Evaluation):   Initial Exercise Prescription:  Initial Exercise Prescription - 04/17/20 1000      Date of Initial Exercise RX and Referring Provider   Date 04/17/20    Referring Provider Melodie Bouillon MD    Primary Cardiologist: Dr. Arelia Sneddon Croitoru     Treadmill   MPH 1.7    Grade 0    Minutes 15    METs 2.3      REL-XR   Level 2    Speed 50    Minutes 15    METs 2.5      T5 Nustep   Level 3    SPM 80    Minutes 15    METs 2.5      Prescription Details   Frequency (times per week) 2    Duration Progress to 30 minutes of continuous aerobic without signs/symptoms of physical distress       Intensity   THRR 40-80% of Max Heartrate 120-149    Ratings of Perceived Exertion 11-13    Perceived Dyspnea 0-4      Progression   Progression Continue to progress workloads to maintain intensity without signs/symptoms of physical distress.      Resistance Training   Training Prescription Yes    Weight 3lbs (right), 1 lb (left)    Reps 10-15           Perform Capillary Blood Glucose checks as needed.  Exercise Prescription Changes:  Exercise Prescription Changes    Row Name 04/17/20 1000 04/19/20 1200 05/02/20 1500 05/16/20 1400 05/30/20 1400     Response to Exercise   Blood Pressure (Admit) 118/64 114/58 138/64 138/68 126/62   Blood Pressure (Exercise) 186/82  rck 154/82 128/58 144/62 128/56 128/70   Blood Pressure (Exit) 124/72 112/56 122/64 114/64 118/68   Heart Rate (Admit) 92 bpm 93 bpm 91 bpm 94 bpm 85 bpm   Heart Rate (Exercise) 106 bpm 108 bpm 113 bpm 110 bpm 110 bpm   Heart Rate (Exit) 97 bpm 94 bpm 91 bpm 86 bpm 82 bpm   Oxygen Saturation (Admit) 99 % -- -- -- --   Oxygen Saturation (Exercise) 99 % -- -- -- --   Rating of Perceived Exertion (Exercise) 13 11 11 11 11    Symptoms none none none none none   Comments walk test results -- -- -- --   Duration -- Continue with 30 min of aerobic exercise without signs/symptoms of physical distress. Continue with 30 min of aerobic exercise without signs/symptoms of physical distress. Continue with 30 min of aerobic exercise  without signs/symptoms of physical distress. Continue with 30 min of aerobic exercise without signs/symptoms of physical distress.   Intensity -- THRR unchanged THRR unchanged THRR unchanged THRR unchanged     Progression   Progression -- -- Continue to progress workloads to maintain intensity without signs/symptoms of physical distress. Continue to progress workloads to maintain intensity without signs/symptoms of physical distress. Continue to progress workloads to maintain intensity without  signs/symptoms of physical distress.   Average METs -- 3.3 3.9 4.5 5.4     Resistance Training   Training Prescription -- Yes Yes Yes Yes   Weight -- 3lbs (right), 1 lb (left) 3lbs (right), 1 lb (left) 3lbs (right), 1 lb (left) 3lbs (right), 1 lb (left)   Reps -- 10-15 10-15 10-15 10-15     Interval Training   Interval Training -- -- No No No     Treadmill   MPH -- 1.7 2.1 2.4 2.7   Grade -- 0 1 1.5 2.5   Minutes -- 15 15 15 15    METs -- 2.3 2.9 3.33 4     REL-XR   Level -- 2 2 3 3    Speed -- 50 -- 50 --   Minutes -- 15 15 15 15    METs -- 4.3 6.3 5.7 6.8     T5 Nustep   Level -- -- 3 -- --   Minutes -- -- 15 -- --   METs -- -- 2.5 -- --     Home Exercise Plan   Plans to continue exercise at -- -- Home (comment)  walking, biking Home (comment)  walking, biking Home (comment)  walking, biking   Frequency -- -- Add 2 additional days to program exercise sessions. Add 2 additional days to program exercise sessions. Add 2 additional days to program exercise sessions.   Initial Home Exercises Provided -- -- 04/27/20 04/27/20 04/27/20   Row Name 06/13/20 1400 06/28/20 1300           Response to Exercise   Blood Pressure (Admit) 142/72 130/68      Blood Pressure (Exercise) 132/54 132/60      Blood Pressure (Exit) 146/70 122/64      Heart Rate (Admit) 77 bpm 77 bpm      Heart Rate (Exercise) 117 bpm 92 bpm      Heart Rate (Exit) 80 bpm 80 bpm      Rating of Perceived Exertion (Exercise) 13 12      Symptoms none none      Duration Continue with 30 min of aerobic exercise without signs/symptoms of physical distress. Continue with 30 min of aerobic exercise without signs/symptoms of physical distress.      Intensity THRR unchanged THRR unchanged        Progression   Progression Continue to progress workloads to maintain intensity without signs/symptoms of physical distress. Continue to progress workloads to maintain intensity without signs/symptoms of physical distress.       Average METs 5.22 4.31        Resistance Training   Training Prescription Yes Yes      Weight 3lbs (right), 1 lb (left) 3lbs (right), 1 lb (left)      Reps 10-15 10-15        Interval Training   Interval Training No No        Treadmill   MPH 3 3      Grade 3 3.5      Minutes 15 15      METs  4.54 4.74        Elliptical   Level -- 1      Speed -- 2.5      Minutes -- 15      METs -- 2.3        REL-XR   Level 3 3      Minutes 15 15      METs 5.9 5.9        Home Exercise Plan   Plans to continue exercise at Home (comment)  walking, biking Home (comment)  walking, biking      Frequency Add 2 additional days to program exercise sessions. Add 2 additional days to program exercise sessions.      Initial Home Exercises Provided 04/27/20 04/27/20             Exercise Comments:  Exercise Comments    Row Name 04/18/20 0949           Exercise Comments First full day of exercise!  Patient was oriented to gym and equipment including functions, settings, policies, and procedures.  Patient's individual exercise prescription and treatment plan were reviewed.  All starting workloads were established based on the results of the 6 minute walk test done at initial orientation visit.  The plan for exercise progression was also introduced and progression will be customized based on patient's performance and goals.              Exercise Goals and Review:  Exercise Goals    Row Name 04/17/20 1036             Exercise Goals   Increase Physical Activity Yes       Intervention Provide advice, education, support and counseling about physical activity/exercise needs.;Develop an individualized exercise prescription for aerobic and resistive training based on initial evaluation findings, risk stratification, comorbidities and participant's personal goals.       Expected Outcomes Short Term: Attend rehab on a regular basis to increase amount of physical activity.;Long Term: Add in home  exercise to make exercise part of routine and to increase amount of physical activity.;Long Term: Exercising regularly at least 3-5 days a week.       Increase Strength and Stamina Yes       Intervention Provide advice, education, support and counseling about physical activity/exercise needs.;Develop an individualized exercise prescription for aerobic and resistive training based on initial evaluation findings, risk stratification, comorbidities and participant's personal goals.       Expected Outcomes Short Term: Increase workloads from initial exercise prescription for resistance, speed, and METs.;Short Term: Perform resistance training exercises routinely during rehab and add in resistance training at home;Long Term: Improve cardiorespiratory fitness, muscular endurance and strength as measured by increased METs and functional capacity (6MWT)       Able to understand and use rate of perceived exertion (RPE) scale Yes       Intervention Provide education and explanation on how to use RPE scale       Expected Outcomes Long Term:  Able to use RPE to guide intensity level when exercising independently;Short Term: Able to use RPE daily in rehab to express subjective intensity level       Able to understand and use Dyspnea scale Yes       Intervention Provide education and explanation on how to use Dyspnea scale       Expected Outcomes Long Term: Able to use Dyspnea scale to guide intensity level when exercising independently;Short Term: Able to use Dyspnea scale  daily in rehab to express subjective sense of shortness of breath during exertion       Knowledge and understanding of Target Heart Rate Range (THRR) Yes       Intervention Provide education and explanation of THRR including how the numbers were predicted and where they are located for reference       Expected Outcomes Short Term: Able to state/look up THRR;Short Term: Able to use daily as guideline for intensity in rehab;Long Term: Able to use  THRR to govern intensity when exercising independently       Able to check pulse independently Yes       Intervention Provide education and demonstration on how to check pulse in carotid and radial arteries.;Review the importance of being able to check your own pulse for safety during independent exercise       Expected Outcomes Short Term: Able to explain why pulse checking is important during independent exercise;Long Term: Able to check pulse independently and accurately       Understanding of Exercise Prescription Yes       Intervention Provide education, explanation, and written materials on patient's individual exercise prescription       Expected Outcomes Short Term: Able to explain program exercise prescription;Long Term: Able to explain home exercise prescription to exercise independently              Exercise Goals Re-Evaluation :  Exercise Goals Re-Evaluation    Row Name 04/18/20 0949 04/27/20 0808 05/02/20 1534 05/16/20 1426 05/30/20 1443     Exercise Goal Re-Evaluation   Exercise Goals Review Able to understand and use rate of perceived exertion (RPE) scale;Knowledge and understanding of Target Heart Rate Range (THRR);Understanding of Exercise Prescription Increase Physical Activity;Increase Strength and Stamina;Understanding of Exercise Prescription Increase Physical Activity;Increase Strength and Stamina;Understanding of Exercise Prescription Increase Physical Activity;Increase Strength and Stamina;Understanding of Exercise Prescription Increase Physical Activity;Increase Strength and Stamina;Understanding of Exercise Prescription   Comments Reviewed RPE and dyspnea scales, THR and program prescription with pt today.  Pt voiced understanding and was given a copy of goals to take home. Allahna is off to a good start in rehab.  Her strength and stamina are recovering and she is no longer as tired.  She is already riding her bike and walking at home.  Reviewed home exercise with pt today.   Pt plans to continue to ride her bike and walk at home for exercise.  She was already at 20 min and will increase to 30 min. Reviewed THR, pulse, RPE, sign and symptoms, pulse oximetery and when to call 911 or MD.  Also discussed weather considerations and indoor options.  Pt voiced understanding. Foye is off on vacation this week, walking on the beach!  She  is up to 6.3 METs on the XR.  We will continue to monitor her progress. Antoniette continues to increase workloads on machines.  She continues to have some issues with her wrist but is seeing OT. Nargis has been doing well in rehab.  She is now up to 2.7 mph on the treadmill!!  She is doing better with her wrist but also having some chest pain as noted in [facility] notes with cardiologist.   Expected Outcomes Short: Use RPE daily to regulate intensity. Long: Follow program prescription in THR. Short: Increase time at home to 30 min Long: Continue to improve strength and stamina. Short: Try level 3 on XR and walk on vacation Long: Continue to improve stamina. Short: continue to attend consistently  Long: increase MET levels Short: Figure out chest discomfort Long: Continue to improve stamina.   [place] Name 06/13/20 1429 06/28/20 1334 06/29/20 0801         Exercise Goal Re-Evaluation   Exercise Goals Review Increase Physical Activity;Increase Strength and Stamina;Understanding of Exercise Prescription Increase Physical Activity;Increase Strength and Stamina;Understanding of Exercise Prescription Increase Physical Activity;Increase Strength and Stamina;Understanding of Exercise Prescription     Comments Adahlia continues to progress well and has increased speed and grade on TM.  Staff will monitor progress. Nurah is doing well in rehab.  She is thinking that she will graduate early around 9/7, just before her hand surgery.  She is doing well with the elliptical still.  We will continue to monitor her progress. Joeli is currently walking everyday for about 30-40 min  several days at home. She is aware of her THR and was encouraged to use the RPE scale. She recognizes to not complete exercise under severe weather circumstances. She also completes hand weights at home. She is going to graduate in the next couples of weeks and plans to continue exercise with [employer].     Expected Outcomes Short:  continue to progress workloads Long: increase stamina Short: Improve post 6MWT Long: Continue to exercise independently Short: Graduate and improve 6MWT Long: Exercise independently at home            Discharge Exercise Prescription (Final Exercise Prescription Changes):  Exercise Prescription Changes - 06/28/20 1300      Response to Exercise   Blood Pressure (Admit) 130/68    Blood Pressure (Exercise) 132/60    Blood Pressure (Exit) 122/64    Heart Rate (Admit) 77 bpm    Heart Rate (Exercise) 92 bpm    Heart Rate (Exit) 80 bpm    Rating of Perceived Exertion (Exercise) 12    Symptoms none    Duration Continue with 30 min of aerobic exercise without signs/symptoms of physical distress.    Intensity THRR unchanged      Progression   Progression Continue to progress workloads to maintain intensity without signs/symptoms of physical distress.    Average METs 4.31      Resistance Training   Training Prescription Yes    Weight 3lbs (right), 1 lb (left)    Reps 10-15      Interval Training   Interval Training No      Treadmill   MPH 3    Grade 3.5    Minutes 15    METs 4.74      Elliptical   Level 1    Speed 2.5    Minutes 15    METs 2.3      REL-XR   Level 3    Minutes 15    METs 5.9      Home Exercise Plan   Plans to continue exercise at Home (comment)   walking, biking   Frequency Add 2 additional days to program exercise sessions.    Initial Home Exercises Provided 04/27/20           Nutrition:  Target Goals: Understanding of nutrition guidelines, daily intake of sodium <1533m, cholesterol <2046m calories 30% from fat and  7% or less from saturated fats, daily to have 5 or more servings of fruits and vegetables.  Education: Controlling Sodium/Reading Food Labels -Group verbal and written material supporting the discussion of sodium use in heart healthy nutrition. Review and explanation with models, verbal and written materials for utilization of the food label.  Education: General Nutrition Guidelines/Fats and Fiber: -Group instruction provided by verbal, written material, models and posters to present the general guidelines for heart healthy nutrition. Gives an explanation and review of dietary fats and fiber.   Biometrics:  Pre Biometrics - 04/17/20 1037      Pre Biometrics   Height 5' 3.75" (1.619 m)    Weight 132 lb 11.2 oz (60.2 kg)    BMI (Calculated) 22.96    Single Leg Stand 1.66 seconds            Nutrition Therapy Plan and Nutrition Goals:  Nutrition Therapy & Goals - 05/22/20 1002      Nutrition Therapy   Diet Heart healthy, T1DM diet    Protein (specify units) 50g    Fiber 25 grams    Whole Grain Foods 3 servings    Saturated Fats 12 max. grams    Fruits and Vegetables 5 servings/day    Sodium 1.5 grams      Personal Nutrition Goals   Nutrition Goal ST: continue with heart healthy changes LT: continue with heart healthy changes    Comments Discussed heart healthy eating. Pt reports doing research on heart healthy eating and understands heart healthy eaitng. Pt has been doing well with changes. Discussed variety and flavor.      Intervention Plan   Intervention Prescribe, educate and counsel regarding individualized specific dietary modifications aiming towards targeted core components such as weight, hypertension, lipid management, diabetes, heart failure and other comorbidities.;Nutrition handout(s) given to patient.    Expected Outcomes Short Term Goal: Understand basic principles of dietary content, such as calories, fat, sodium, cholesterol and nutrients.;Short Term Goal: A  plan has been developed with personal nutrition goals set during dietitian appointment.;Long Term Goal: Adherence to prescribed nutrition plan.           Nutrition Assessments:  Nutrition Assessments - 04/11/20 1121      MEDFICTS Scores   Pre Score 3           MEDIFICTS Score Key:          ?70 Need to make dietary changes          40-70 Heart Healthy Diet         ? 40 Therapeutic Level Cholesterol Diet  Nutrition Goals Re-Evaluation:  Nutrition Goals Re-Evaluation    [place] Name 04/27/20 0816 06/28/20 0758           Goals   Nutrition Goal -- ST: continue with heart healthy changes LT: continue with heart healthy changes      Comment Will meet with dietician once both back from vacation. Yonna has been independently managing her heart healthy eating and will continue to do so, she does not have any questions or concerns at this time.      Expected Outcome -- ST: continue with heart healthy changes LT: continue with heart healthy changes             Nutrition Goals Discharge (Final Nutrition Goals Re-Evaluation):  Nutrition Goals Re-Evaluation - 06/28/20 0758      Goals   Nutrition Goal ST: continue with heart healthy changes LT: continue with heart healthy changes    Comment Serrina has been independently managing her heart healthy eating and will continue to do so, she does not have any questions or concerns at this time.    Expected Outcome ST: continue with heart healthy changes LT: continue with heart healthy changes  Psychosocial: Target Goals: Acknowledge presence or absence of significant depression and/or stress, maximize coping skills, provide positive support system. Participant is able to verbalize types and ability to use techniques and skills needed for reducing stress and depression.   Education: Depression - Provides group verbal and written instruction on the correlation between heart/lung disease and depressed mood, treatment options, and the  stigmas associated with seeking treatment.   Cardiac Rehab from 07/06/2020 in [facility] Cardiac and Pulmonary Rehab  Date 06/08/20  Educator [facility]  Instruction Review Code 1- Verbalizes Understanding      Education: Sleep Hygiene -Provides group verbal and written instruction about how sleep can affect your health.  Define sleep hygiene, discuss sleep cycles and impact of sleep habits. Review good sleep hygiene tips.     Education: Stress and Anxiety: - Provides group verbal and written instruction about the health risks of elevated stress and causes of high stress.  Discuss the correlation between heart/lung disease and anxiety and treatment options. Review healthy ways to manage with stress and anxiety.   Cardiac Rehab from 07/06/2020 in [facility] Cardiac and Pulmonary Rehab  Date 06/08/20  Educator [facility]  Instruction Review Code 1- Verbalizes Understanding       Initial Review & Psychosocial Screening:  Initial Psych Review & Screening - 04/10/20 1414      Initial Review   Current issues with Current Stress Concerns;Current Sleep Concerns    Source of Stress Concerns Unable to participate in former interests or hobbies;Unable to perform yard/household activities;Occupation    Comments Averages about 4-5 hours of sleep each night post surgery, eager to return to work      [place]? Yes   husband, daughter, mother, and granddaughter   Concerns No support system      Barriers   Psychosocial barriers to participate in program The patient should benefit from training in stress management and relaxation.;Psychosocial barriers identified (see note)      Screening Interventions   Interventions Encouraged to exercise;To provide support and resources with identified psychosocial needs;Provide feedback about the scores to participant    Expected Outcomes Short Term goal: Utilizing psychosocial counselor, staff and physician to assist with identification of specific Stressors  or current issues interfering with healing process. Setting desired goal for each stressor or current issue identified.;Long Term Goal: Stressors or current issues are controlled or eliminated.;Short Term goal: Identification and review with participant of any Quality of Life or Depression concerns found by scoring the questionnaire.;Long Term goal: The participant improves quality of Life and PHQ9 Scores as seen by post scores and/or verbalization of changes           Quality of Life Scores:   Quality of Life - 07/06/20 0818      Quality of Life   Select Quality of Life      Quality of Life Scores   Health/Function Pre 22.1 %    Health/Function Post 27.73 %    Health/Function % Change 25.48 %    Socioeconomic Pre 15.68 %    Socioeconomic Post 25.36 %    Socioeconomic % Change  61.73 %    Psych/Spiritual Pre 26.78 %    Psych/Spiritual Post 28.57 %    Psych/Spiritual % Change 6.68 %    Family Pre 10.8 %    Family Post 29.5 %    Family % Change 173.15 %    GLOBAL Pre 19.95 %    GLOBAL Post 27.67 %  GLOBAL % Change 38.7 %          Scores of 19 and below usually indicate a poorer quality of life in these areas.  A difference of  2-3 points is a clinically meaningful difference.  A difference of 2-3 points in the total score of the Quality of Life Index has been associated with significant improvement in overall quality of life, self-image, physical symptoms, and general health in studies assessing change in quality of life.  PHQ-9: Recent Review Flowsheet Data    Depression screen [facility] 2/9 07/06/2020 04/17/2020 02/07/2020   Decreased Interest 0 0 0   Down, Depressed, Hopeless 0 0 0   PHQ - 2 Score 0 0 0   Altered sleeping 1 1 2    Tired, decreased energy 0 0 2   Change in appetite 0 0 0   Feeling bad or failure about yourself  0 0 0   Trouble concentrating 0 0 2   Moving slowly or fidgety/restless 0 0 0   Suicidal thoughts 0 0 0   PHQ-9 Score 1 1 6    Difficult doing work/chores  Not difficult at all Not difficult at all -     Interpretation of Total Score  Total Score Depression Severity:  1-4 = Minimal depression, 5-9 = Mild depression, 10-14 = Moderate depression, 15-19 = Moderately severe depression, 20-27 = Severe depression   Psychosocial Evaluation and Intervention:  Psychosocial Evaluation - 04/27/20 0813      Psychosocial Evaluation & Interventions   Interventions Encouraged to exercise with the program and follow exercise prescription;Stress management education    Comments Averages about 4-5 hours of sleep each night post surgery, eager to return to work.  Should benefit from exercise.    Expected Outcomes Exercise an dimprove sleep    Continue Psychosocial Services  Follow up required by staff           Psychosocial Re-Evaluation:  Psychosocial Re-Evaluation    Row Name 04/27/20 0814 06/29/20 1517           Psychosocial Re-Evaluation   Current issues with Current Sleep Concerns Current Sleep Concerns      Comments Lareta is doing well mentally.  Her sleep continues to be her biggest issue as it is very sporadic and broken.  She usually gets about 4-5 hours.  She is headed out for vacation next week to [place] to get away for a while. Benetta is holding up very well. Her biggest stressor is trying to find a job right now as her previous employer let her go. Shaneque has not been sleeping very well. Her wrist gives her pain at night which wakes her up. She has an appointment the first week of September for surgery to help fix her wrist and then will proceed to go through PT afterwards. She will be graduating in a couple of weeks and has been doing well in the program.      Expected Outcomes Short: Enjoy vacation Long: Continue to work on sleep. Short: Complete surgery for wrist Long: Improve sleep quality and utilize exercise for stress management      Interventions Encouraged to attend Cardiac Rehabilitation for the exercise;Relaxation education  Encouraged to attend Cardiac Rehabilitation for the exercise      Continue Psychosocial Services  Follow up required by staff Follow up required by staff             Psychosocial Discharge (Final Psychosocial Re-Evaluation):  Psychosocial Re-Evaluation - 06/29/20 6160  Psychosocial Re-Evaluation   Current issues with Current Sleep Concerns    Comments Saxon is holding up very well. Her biggest stressor is trying to find a job right now as her previous employer let her go. Tynesha has not been sleeping very well. Her wrist gives her pain at night which wakes her up. She has an appointment the first week of September for surgery to help fix her wrist and then will proceed to go through PT afterwards. She will be graduating in a couple of weeks and has been doing well in the program.    Expected Outcomes Short: Complete surgery for wrist Long: Improve sleep quality and utilize exercise for stress management    Interventions Encouraged to attend Cardiac Rehabilitation for the exercise    Continue Psychosocial Services  Follow up required by staff           Vocational Rehabilitation: Provide vocational rehab assistance to qualifying candidates.   Vocational Rehab Evaluation & Intervention:  Vocational Rehab - 04/10/20 1412      Initial Vocational Rehab Evaluation & Intervention   Assessment shows need for Vocational Rehabilitation No           Education: Education Goals: Education classes will be provided on a variety of topics geared toward better understanding of heart health and risk factor modification. Participant will state understanding/return demonstration of topics presented as noted by education test scores.  Learning Barriers/Preferences:  Learning Barriers/Preferences - 04/10/20 1412      Learning Barriers/Preferences   Learning Barriers Sight   glasses for reading   Learning Preferences Skilled Demonstration           General Cardiac Education  Topics:  AED/CPR: - Group verbal and written instruction with the use of models to demonstrate the basic use of the AED with the basic ABC's of resuscitation.   Anatomy & Physiology of the Heart: - Group verbal and written instruction and models provide basic cardiac anatomy and physiology, with the coronary electrical and arterial systems. Review of Valvular disease and Heart Failure   Cardiac Procedures: - Group verbal and written instruction to review commonly prescribed medications for heart disease. Reviews the medication, class of the drug, and side effects. Includes the steps to properly store meds and maintain the prescription regimen. (beta blockers and nitrates)   Cardiac Medications I: - Group verbal and written instruction to review commonly prescribed medications for heart disease. Reviews the medication, class of the drug, and side effects. Includes the steps to properly store meds and maintain the prescription regimen.   Cardiac Rehab from 07/06/2020 in [facility] Cardiac and Pulmonary Rehab  Date 06/01/20  Educator [facility]  Instruction Review Code 1- Verbalizes Understanding      Cardiac Medications II: -Group verbal and written instruction to review commonly prescribed medications for heart disease. Reviews the medication, class of the drug, and side effects. (all other drug classes)    Go Sex-Intimacy & Heart Disease, Get SMART - Goal Setting: - Group verbal and written instruction through game format to discuss heart disease and the return to sexual intimacy. Provides group verbal and written material to discuss and apply goal setting through the application of the S.M.A.R.T. Method.   Other Matters of the Heart: - Provides group verbal, written materials and models to describe Stable Angina and Peripheral Artery. Includes description of the disease process and treatment options available to the cardiac patient.   Infection Prevention: - Provides verbal and written material  to individual with discussion of  infection control including proper hand washing and proper equipment cleaning during exercise session.   Cardiac Rehab from 07/06/2020 in [facility] Cardiac and Pulmonary Rehab  Date 04/17/20  Educator [facility]  Instruction Review Code 1- Verbalizes Understanding      Falls Prevention: - Provides verbal and written material to individual with discussion of falls prevention and safety.   Cardiac Rehab from 07/06/2020 in [facility] Cardiac and Pulmonary Rehab  Date 04/17/20  Educator [facility]  Instruction Review Code 1- Verbalizes Understanding      Other: -Provides group and verbal instruction on various topics (see comments)   Knowledge Questionnaire Score:  Knowledge Questionnaire Score - 07/06/20 0820      Knowledge Questionnaire Score   Post Score 26/26           Core Components/Risk Factors/Patient Goals at Admission:  Personal Goals and Risk Factors at Admission - 04/17/20 1037      Core Components/Risk Factors/Patient Goals on Admission    Weight Management Yes;Weight Maintenance    Intervention Weight Management: Develop a combined nutrition and exercise program designed to reach desired caloric intake, while maintaining appropriate intake of nutrient and fiber, sodium and fats, and appropriate energy expenditure required for the weight goal.;Weight Management: Provide education and appropriate resources to help participant work on and attain dietary goals.    Admit Weight 132 lb 11.2 oz (60.2 kg)    Goal Weight: Short Term 130 lb (59 kg)    Goal Weight: Long Term 130 lb (59 kg)    Expected Outcomes Short Term: Continue to assess and modify interventions until short term weight is achieved;Long Term: Adherence to nutrition and physical activity/exercise program aimed toward attainment of established weight goal;Weight Maintenance: Understanding of the daily nutrition guidelines, which includes 25-35% calories from fat, 7% or less cal from saturated fats, less than  283m cholesterol, less than 1.5gm of sodium, & 5 or more servings of fruits and vegetables daily    Diabetes Yes    Intervention Provide education about signs/symptoms and action to take for hypo/hyperglycemia.;Provide education about proper nutrition, including hydration, and aerobic/resistive exercise prescription along with prescribed medications to achieve blood glucose in normal ranges: Fasting glucose 65-99 mg/dL    Expected Outcomes Short Term: Participant verbalizes understanding of the signs/symptoms and immediate care of hyper/hypoglycemia, proper foot care and importance of medication, aerobic/resistive exercise and nutrition plan for blood glucose control.;Long Term: Attainment of HbA1C < 7%.    Hypertension Yes    Intervention Provide education on lifestyle modifcations including regular physical activity/exercise, weight management, moderate sodium restriction and increased consumption of fresh fruit, vegetables, and low fat dairy, alcohol moderation, and smoking cessation.;Monitor prescription use compliance.    Expected Outcomes Short Term: Continued assessment and intervention until BP is < 140/967mHG in hypertensive participants. < 130/8039mG in hypertensive participants with diabetes, heart failure or chronic kidney disease.;Long Term: Maintenance of blood pressure at goal levels.    Lipids Yes    Intervention Provide education and support for participant on nutrition & aerobic/resistive exercise along with prescribed medications to achieve LDL <74m61mDL >40mg43m Expected Outcomes Short Term: Participant states understanding of desired cholesterol values and is compliant with medications prescribed. Participant is following exercise prescription and nutrition guidelines.;Long Term: Cholesterol controlled with medications as prescribed, with individualized exercise RX and with personalized nutrition plan. Value goals: LDL < 74mg,39m > 40 mg.           Education:Diabetes -  Individual verbal and written  instruction to review signs/symptoms of diabetes, desired ranges of glucose level fasting, after meals and with exercise. Acknowledge that pre and post exercise glucose checks will be done for 3 sessions at entry of program.   Cardiac Rehab from 07/06/2020 in [facility] Cardiac and Pulmonary Rehab  Date 04/10/20  Educator [facility]  Instruction Review Code 1- Verbalizes Understanding      Education: Know Your Numbers and Risk Factors: -Group verbal and written instruction about important numbers in your health.  Discussion of what are risk factors and how they play a role in the disease process.  Review of Cholesterol, Blood Pressure, Diabetes, and BMI and the role they play in your overall health.   Core Components/Risk Factors/Patient Goals Review:   Goals and Risk Factor Review    Row Name 04/27/20 0816 06/01/20 0804 06/29/20 0805         Core Components/Risk Factors/Patient Goals Review   Personal Goals Review Weight Management/Obesity;Diabetes;Hypertension Weight Management/Obesity;Diabetes;Hypertension Weight Management/Obesity;Diabetes;Hypertension     Review [place] is doing well in rehab.  Her weight has been steady. Her blood sugars have been great and she checks them 5-6x a day. She is waiting for her hand to heal to be able use her new machine. She checks her blood pressures daily and has noticed that it is low first thing in the morning.  She was encouraged to message her doctor about it. Debras weight is still steady.  Her machine tells her the 30 day average blood sugar is 136.  She is having a nerve test next week for her wrist.  BP is still lower in the morning - around 342 systolic.  She can tell it takes her a little longer to get going when it is that low.  She states she is not on BP meds.  She has an echo week after next.  She has had some pain/brusing around sternum and has spoken with her Dr. Hilda Blades is very compliant with taking her sugars at home and reports  stables ranges. She does reocgnize symptoms if she is too high or too low. Her doctor stated her echo had normal results. Pain around the sternum has disspeared. She checks her BPs at home 2x/day and states they are not over 876 systolic. Weight has been steady. Graduation will be in a couple of weeks.     Expected Outcomes Short: Continue to monitor pressures and sugars Long: Continue to montior risk factors. Short: follow up with echo/ monitor risk factors Long: manage risk factors long term Short: Graduate Long: Continue to manage risk factors post graduation            Core Components/Risk Factors/Patient Goals at Discharge (Final Review):   Goals and Risk Factor Review - 06/29/20 0805      Core Components/Risk Factors/Patient Goals Review   Personal Goals Review Weight Management/Obesity;Diabetes;Hypertension    Review Kimimila is very compliant with taking her sugars at home and reports stables ranges. She does reocgnize symptoms if she is too high or too low. Her doctor stated her echo had normal results. Pain around the sternum has disspeared. She checks her BPs at home 2x/day and states they are not over 811 systolic. Weight has been steady. Graduation will be in a couple of weeks.    Expected Outcomes Short: Graduate Long: Continue to manage risk factors post graduation           ITP Comments:  ITP Comments    Row Name 04/10/20 1420 04/17/20 1030 04/18/20 5726  04/19/20 0641 05/17/20 1105   ITP Comments Completed virtual orientation today.  EP evaluation is scheduled for Mon 6/14 at 930.  Documentation for diagnosis can be found in [facility] encounter 4/2, 4/19, 4/25. Completed 6MWT and gym orientation.  Initial ITP created and sent for review to Dr. Emily Filbert, Medical Director. First full day of exercise!  Patient was oriented to gym and equipment including functions, settings, policies, and procedures.  Patient's individual exercise prescription and treatment plan were reviewed.  All  starting workloads were established based on the results of the 6 minute walk test done at initial orientation visit.  The plan for exercise progression was also introduced and progression will be customized based on patient's performance and goals. 30 Day review completed. Medical Director ITP review done, changes made as directed, and signed approval by Medical Director. 30 Day review completed. Medical Director ITP review done, changes made as directed, and signed approval by Medical Director.   Row Name 05/22/20 1029 05/30/20 1442 06/14/20 0635 07/11/20 0816     ITP Comments Completed Initial RD Eval Hilda Blades had been having some chest discomfort and contacted her doctor.  She had some lab work done today and scheduled for an echo on 8/9 for possible pericarditis. 30 Day review completed. Medical Director ITP review done, changes made as directed, and signed approval by Medical Director. Season graduated today from  rehab with 31 sessions completed.  Details of the patient's exercise prescription and what She needs to do in order to continue the prescription and progress were discussed with patient.  Patient was given a copy of prescription and goals.  Patient verbalized understanding.  [place] plans to continue to exercise by walk and bike.           Comments: discharge ITP

## 2020-07-11 NOTE — Progress Notes (Signed)
Daily Session Note  Patient Details  Name: Felicia [name] MRN: [id number] Date of Birth: 07-Apr-1961 Referring Provider:     Cardiac Rehab from 04/17/2020 in [facility] Cardiac and Pulmonary Rehab  Referring Provider Melodie Bouillon MD   [facility] Cardiologist: Dr. Arelia Sneddon Croitoru]      Encounter Date: 07/11/2020  Check In:  Session Check In - 07/11/20 [employer]      Check-In   Supervising physician immediately available to respond to emergencies See telemetry face sheet for immediately available ER MD    Location [facility]-Cardiac & Pulmonary Rehab    Staff Present Renita Papa, RN Geralyn Corwin, RN Margurite Auerbach, MS Exercise Physiologist;Jessica [place], MA, RCEP, CCRP, CCET;Amanda Sommer, [place], ACSM CEP, Exercise Physiologist    Virtual Visit No    Medication changes reported     No    Fall or balance concerns reported    No    Warm-up and Cool-down Performed on first and last piece of equipment    Resistance Training Performed Yes    VAD Patient? No    PAD/SET Patient? No      Pain Assessment   Currently in Pain? No/denies              Social History   Tobacco Use  Smoking Status Never Smoker  Smokeless Tobacco Never Used    Goals Met:  Independence with exercise equipment Exercise tolerated well No report of cardiac concerns or symptoms Strength training completed today  Goals Unmet:  Not Applicable  Comments:  [name] graduated today from  rehab with 31 sessions completed.  Details of the patient's exercise prescription and what She needs to do in order to continue the prescription and progress were discussed with patient.  Patient was given a copy of prescription and goals.  Patient verbalized understanding.  [name] plans to continue to exercise by walk and bike.     Dr. Emily Filbert is Medical Director for [facility] and [name].

## 2020-07-11 NOTE — Progress Notes (Signed)
Discharge Progress Report  Patient Details  Name: Felicia [name] MRN: [id number] Date of Birth: 1961-04-05 Referring Provider:     Cardiac Rehab from 04/17/2020 in [facility]  Referring Provider Melodie Bouillon MD   [Primary Cardiologist: Dr. Arelia Sneddon Croitoru]       Number of Visits: 27  Reason for Discharge:  Patient reached a stable level of exercise. Patient independent in their exercise. Patient has met program and personal goals.  Smoking History:  Social History   Tobacco Use  Smoking Status Never Smoker  Smokeless Tobacco Never Used    Diagnosis:  S/P CABG x 5  NSTEMI (non-ST elevated myocardial infarction) ([place])  ADL UCSD:   Initial Exercise Prescription:  Initial Exercise Prescription - 04/17/20 1000      Date of Initial Exercise RX and Referring Provider   Date 04/17/20    Referring Provider Melodie Bouillon MD    Primary Cardiologist: Dr. Arelia Sneddon Croitoru     Treadmill   MPH 1.7    Grade 0    Minutes 15    METs 2.3      REL-XR   Level 2    Speed 50    Minutes 15    METs 2.5      T5 Nustep   Level 3    SPM 80    Minutes 15    METs 2.5      Prescription Details   Frequency (times per week) 2    Duration Progress to 30 minutes of continuous aerobic without signs/symptoms of physical distress      Intensity   THRR 40-80% of Max Heartrate 120-149    Ratings of Perceived Exertion 11-13    Perceived Dyspnea 0-4      Progression   Progression Continue to progress workloads to maintain intensity without signs/symptoms of physical distress.      Resistance Training   Training Prescription Yes    Weight 3lbs (right), 1 lb (left)    Reps 10-15           Discharge Exercise Prescription (Final Exercise Prescription Changes):  Exercise Prescription Changes - 06/28/20 1300      Response to Exercise   Blood Pressure (Admit) 130/68    Blood Pressure (Exercise) 132/60    Blood Pressure (Exit) 122/64    Heart Rate  (Admit) 77 bpm    Heart Rate (Exercise) 92 bpm    Heart Rate (Exit) 80 bpm    Rating of Perceived Exertion (Exercise) 12    Symptoms none    Duration Continue with 30 min of aerobic exercise without signs/symptoms of physical distress.    Intensity THRR unchanged      Progression   Progression Continue to progress workloads to maintain intensity without signs/symptoms of physical distress.    Average METs 4.31      Resistance Training   Training Prescription Yes    Weight 3lbs (right), 1 lb (left)    Reps 10-15      Interval Training   Interval Training No      Treadmill   MPH 3    Grade 3.5    Minutes 15    METs 4.74      Elliptical   Level 1    Speed 2.5    Minutes 15    METs 2.3      REL-XR   Level 3    Minutes 15    METs 5.9      Home Exercise Plan  Plans to continue exercise at Home (comment)   walking, biking   Frequency Add 2 additional days to program exercise sessions.    Initial Home Exercises Provided 04/27/20           Functional Capacity:  6 Minute Walk    Row Name 04/17/20 1030 07/04/20 0811       6 Minute Walk   Phase Initial Discharge    Distance 940 feet 1518 feet    Distance % Change -- 61 %    Distance Feet Change -- 578 ft    Walk Time 6 minutes 6 minutes    # of Rest Breaks 0 0    MPH 1.78 2.88    METS 3.65 4.3    RPE 13 11    VO2 Peak 12.77 14.93    Symptoms No No    Resting HR 92 bpm 85 bpm    Resting BP 118/64 128/70    Resting Oxygen Saturation  99 % --    Exercise Oxygen Saturation  during 6 min walk 99 % --    Max Ex. HR 106 bpm 100 bpm    Max Ex. BP 186/82 148/60    2 Minute Post BP 154/82 --           Psychological, QOL, Others - Outcomes: PHQ 2/9: Depression screen [facility] 2/9 07/06/2020 04/17/2020 02/07/2020  Decreased Interest 0 0 0  Down, Depressed, Hopeless 0 0 0  PHQ - 2 Score 0 0 0  Altered sleeping 1 1 2   Tired, decreased energy 0 0 2  Change in appetite 0 0 0  Feeling bad or failure about yourself  0 0 0   Trouble concentrating 0 0 2  Moving slowly or fidgety/restless 0 0 0  Suicidal thoughts 0 0 0  PHQ-9 Score 1 1 6   Difficult doing work/chores Not difficult at all Not difficult at all -  Some recent data might be hidden    Quality of Life:  Quality of Life - 07/06/20 0818      Quality of Life   Select Quality of Life      Quality of Life Scores   Health/Function Pre 22.1 %    Health/Function Post 27.73 %    Health/Function % Change 25.48 %    Socioeconomic Pre 15.68 %    Socioeconomic Post 25.36 %    Socioeconomic % Change  61.73 %    Psych/Spiritual Pre 26.78 %    Psych/Spiritual Post 28.57 %    Psych/Spiritual % Change 6.68 %    Family Pre 10.8 %    Family Post 29.5 %    Family % Change 173.15 %    GLOBAL Pre 19.95 %    GLOBAL Post 27.67 %    GLOBAL % Change 38.7 %           Nutrition & Weight - Outcomes:  Pre Biometrics - 04/17/20 1037      Pre Biometrics   Height 5' 3.75" (1.619 m)    Weight 132 lb 11.2 oz (60.2 kg)    BMI (Calculated) 22.96    Single Leg Stand 1.66 seconds            Nutrition:  Nutrition Therapy & Goals - 05/22/20 1002      Nutrition Therapy   Diet Heart healthy, T1DM diet    Protein (specify units) 50g    Fiber 25 grams    Whole Grain Foods 3 servings    Saturated Fats 12  max. grams    Fruits and Vegetables 5 servings/day    Sodium 1.5 grams      Personal Nutrition Goals   Nutrition Goal ST: continue with heart healthy changes LT: continue with heart healthy changes    Comments Discussed heart healthy eating. Pt reports doing research on heart healthy eating and understands heart healthy eaitng. Pt has been doing well with changes. Discussed variety and flavor.      Intervention Plan   Intervention Prescribe, educate and counsel regarding individualized specific dietary modifications aiming towards targeted core components such as weight, hypertension, lipid management, diabetes, heart failure and other  comorbidities.;Nutrition handout(s) given to patient.    Expected Outcomes Short Term Goal: Understand basic principles of dietary content, such as calories, fat, sodium, cholesterol and nutrients.;Short Term Goal: A plan has been developed with personal nutrition goals set during dietitian appointment.;Long Term Goal: Adherence to prescribed nutrition plan.           Nutrition Discharge:  Nutrition Assessments - 04/11/20 1121      MEDFICTS Scores   Pre Score 3           Education Questionnaire Score:  Knowledge Questionnaire Score - 07/06/20 0820      Knowledge Questionnaire Score   Post Score 26/26           Goals reviewed with patient; copy given to patient.

## 2020-07-11 NOTE — Telephone Encounter (Signed)
   Primary Cardiologist: Thurmon Fair, MD  Chart reviewed and patient contacted today by phone as part of pre-operative protocol coverage. Given past medical history and time since last visit, based on ACC/AHA guidelines, SALEHA KALP would be at acceptable risk for the planned procedure without further cardiovascular testing.   OK to hold aspirin pre op if needed- resume when safe post op.   The patient was advised that if she develops new symptoms prior to surgery to contact our office to arrange for a follow-up visit, and she verbalized understanding.  I will route this recommendation to the requesting party via Epic fax function and remove from pre-op pool.  Please call with questions.  Corine Shelter, PA-C 07/11/2020, 11:20 AM

## 2020-07-11 NOTE — Telephone Encounter (Signed)
   [facility] Pre-operative Risk Assessment    HEARTCARE STAFF: - Please ensure there is not already an duplicate clearance open for this procedure. - Under Visit Info/Reason for Call, type in Other and utilize the format Clearance MM/DD/YY or Clearance TBD. Do not use dashes or single digits. - If request is for dental extraction, please clarify the # of teeth to be extracted.  Request for surgical clearance:  1. What type of surgery is being performed? Left open carpal tunnel release, left long finger revision with a left first dorsal compartment cyst excision  2. When is this surgery scheduled? 07/12/2020  3. What type of clearance is required (medical clearance vs. Pharmacy clearance to hold med vs. Both)? medical  4. Are there any medications that need to be held prior to surgery and how long? no  5. Practice name and name of physician performing surgery? [facility], Dr. Peggye Ley  6. What is the office phone number? [phone]   7.   What is the office fax number? [phone]  8.   Anesthesia type (None, local, MAC, general) ? Regional block   Felicia [name] 07/11/2020, 9:06 AM  _________________________________________________________________   (provider comments below)

## 2020-07-12 ENCOUNTER — Telehealth: Payer: Self-pay

## 2020-07-12 ENCOUNTER — Ambulatory Visit
Admission: EM | Admit: 2020-07-12 | Discharge: 2020-07-12 | Disposition: A | Discharge status: Home / Self Care | Payer: BC Managed Care – PPO | Attending: Family Medicine | Admitting: Family Medicine

## 2020-07-12 ENCOUNTER — Other Ambulatory Visit: Payer: Self-pay

## 2020-07-12 DIAGNOSIS — G5602 Carpal tunnel syndrome, left upper limb: Secondary | ICD-10-CM | POA: Diagnosis not present

## 2020-07-12 DIAGNOSIS — I252 Old myocardial infarction: Secondary | ICD-10-CM | POA: Diagnosis not present

## 2020-07-12 DIAGNOSIS — E7800 Pure hypercholesterolemia, unspecified: Secondary | ICD-10-CM | POA: Diagnosis not present

## 2020-07-12 DIAGNOSIS — L03031 Cellulitis of right toe: Secondary | ICD-10-CM | POA: Diagnosis not present

## 2020-07-12 DIAGNOSIS — Z91040 Latex allergy status: Secondary | ICD-10-CM | POA: Diagnosis not present

## 2020-07-12 DIAGNOSIS — M25532 Pain in left wrist: Secondary | ICD-10-CM | POA: Diagnosis not present

## 2020-07-12 DIAGNOSIS — G8918 Other acute postprocedural pain: Secondary | ICD-10-CM | POA: Diagnosis not present

## 2020-07-12 DIAGNOSIS — Z888 Allergy status to other drugs, medicaments and biological substances status: Secondary | ICD-10-CM | POA: Diagnosis not present

## 2020-07-12 DIAGNOSIS — E1043 Type 1 diabetes mellitus with diabetic autonomic (poly)neuropathy: Secondary | ICD-10-CM | POA: Diagnosis not present

## 2020-07-12 DIAGNOSIS — K3184 Gastroparesis: Secondary | ICD-10-CM | POA: Diagnosis not present

## 2020-07-12 DIAGNOSIS — M7989 Other specified soft tissue disorders: Secondary | ICD-10-CM | POA: Diagnosis not present

## 2020-07-12 DIAGNOSIS — Z794 Long term (current) use of insulin: Secondary | ICD-10-CM | POA: Diagnosis not present

## 2020-07-12 DIAGNOSIS — E1042 Type 1 diabetes mellitus with diabetic polyneuropathy: Secondary | ICD-10-CM | POA: Diagnosis not present

## 2020-07-12 DIAGNOSIS — Z951 Presence of aortocoronary bypass graft: Secondary | ICD-10-CM | POA: Diagnosis not present

## 2020-07-12 DIAGNOSIS — M65332 Trigger finger, left middle finger: Secondary | ICD-10-CM | POA: Diagnosis not present

## 2020-07-12 MED ORDER — SULFAMETHOXAZOLE-TRIMETHOPRIM 800-160 MG PO TABS
1.0000 | ORAL_TABLET | Freq: Two times a day (BID) | ORAL | 0 refills | Status: AC
Start: 2020-07-12 — End: 2020-07-19

## 2020-07-12 NOTE — Telephone Encounter (Signed)
Felicia [name] with Access nurse said that pt has 2nd toe on rt foot is purple that started on 07/11/20. No known injury to toe. Pt is diabetic but said BS are OK. Access nurse said pt needs to be seen within 4 hrs. And no available appts at [facility]; pt had recent CABG and had carpal tunnel surgery this morning.Dr Reece Agar advised pt should be seen in UC today. Pt voiced understanding and plans to go to [facility] in [place].

## 2020-07-12 NOTE — Discharge Instructions (Signed)
I have sent in Bactrim for you to take twice a day for a week  It appears that you have a bug bite to your toe  Follow up if not improving after 24-48 hours  Follow up with the ER for trouble swallowing, trouble breathing, other concerning symptoms

## 2020-07-12 NOTE — ED Triage Notes (Signed)
Pt is here with a right #2 finger that she noticed yesterday, pt has not not taken anything to relieve discomfort, pt states there is no pain.

## 2020-07-12 NOTE — Telephone Encounter (Signed)
Records reviewed. Seen at [facility], treated for cellulitis with bactrim abx.  plz call Friday for update on symptoms, ensure tolerating abx and toe is improving.

## 2020-07-12 NOTE — Telephone Encounter (Signed)
[facility] Day - Client TELEPHONE ADVICE RECORD AccessNurse Patient Name: Felicia [name] Gender: Female DOB: 1961-08-08 Age: 59 Y 4 M 24 D Return Phone Number: [phone] (Primary), [phone] (Secondary) Address: [place] [address] Client [facility] Day - Client Client Site [facility] [place] - Day Physician [name] - MD Contact Type Call Who Is Calling Patient / Member / Family / Caregiver Call Type Triage / Clinical Caller Name Nandana Krolikowski Relationship To Patient Spouse Return Phone Number [phone] (Secondary) Chief Complaint Unclassified Symptom Reason for Call Symptomatic / Request for Health Information Initial Comment Caller states his wife's toe next to the big toe on her right foot is purple in color. This is on the same leg they harvested to do vein or artery graft. There is no known injury or pain. She is also a diabetic. She did have a cabbage x 5 surgery. He would like to have her seen today if possible. Translation No Nurse Assessment Nurse: Darlin Coco, RN, Charyl Dancer Date/Time (Eastern Time): 07/12/2020 9:59:44 AM Confirm and document reason for call. If symptomatic, describe symptoms. ---Caller states her right second toe is purple, first noted yesterday. States this is the same leg that she had a veins harvested. Has a CABGx5 in April 2021. Denies any known injury or pain. Has the patient had close contact with a person known or suspected to have the novel coronavirus illness OR traveled / lives in area with major community spread (including international travel) in the last 14 days from the onset of symptoms? * If Asymptomatic, screen for exposure and travel within the last 14 days. ---No Does the patient have any new or worsening symptoms? ---Yes Will a triage be completed? ---Yes Related visit to physician within the last 2 weeks? ---N/A Does the PT have  any chronic conditions? (i.e. diabetes, asthma, this includes High risk factors for pregnancy, etc.) ---Yes List chronic conditions. ---CABGx5 in April 2021, DM Is this a behavioral health or substance abuse call? ---No PLEASE NOTE: All timestamps contained within this report are represented as [place] Standard Time. CONFIDENTIALTY NOTICE: This fax transmission is intended only for the addressee. It contains information that is legally privileged, confidential or otherwise protected from use or disclosure. If you are not the intended recipient, you are strictly prohibited from reviewing, disclosing, copying using or disseminating any of this information or taking any action in reliance on or regarding this information. If you have received this fax in error, please notify us immediately by telephone so that we can arrange for its return to [place]. Phone: [phone], Toll-Free: [phone], Fax: [phone] Page: 2 of 2 Call Id: [id number] Guidelines Guideline Title Affirmed Question Affirmed Notes Nurse Date/Time Lamount Cohen Time) Diabetes - Foot Problems and Questions [1] Purple or black skin on foot or toe AND [2] new or increased Cazares, RN, Charyl Dancer 07/12/2020 10:03:52 AM Disp. Time Lamount Cohen Time) Disposition Final User 07/12/2020 10:05:36 AM See HCP within 4 Hours (or PCP triage) Yes Cazares, RN, Charyl Dancer Caller Disagree/Comply Comply Caller Understands Yes PreDisposition Did not know what to do Care Advice Given Per Guideline SEE HCP (OR PCP TRIAGE) WITHIN 4 HOURS: * IF OFFICE WILL BE OPEN: You need to be seen within the next 3 or 4 hours. Call your doctor (or NP/PA) now or as soon as the office opens. BRING MEDICINES: CALL BACK IF: * You become worse CARE ADVICE given per Diabetes - Foot Problems and Questions (Adult) guideline. Comments  User: Charyl Dancer, Darlin Coco, RN Date/Time Lamount Cohen Time): 07/12/2020 10:13:32 AM Called office backline, spoke with Lyla Son  about trying to obtain an appointment for the patient within the next 4hrs (per triage outcome). All information provided. Also spoke with nurse Eddison Searls. States they do not have any available appointments at this time. Warm transferred her to speak directly with the patient for further instructions. Referrals REFERRED TO PCP OFFICE Warm transfer to backline

## 2020-07-12 NOTE — Telephone Encounter (Signed)
Per chart review tab pt went to [facility] in . 

## 2020-07-12 NOTE — ED Provider Notes (Signed)
[facility]   [id number] 07/12/20 Arrival Time: 1036  CC: RASH  SUBJECTIVE:  Felicia [name] is a 59 y.o. female who presents with a skin complaint that began today. Reports that she is diabetic and was concerned about the color change in her right 2nd toe. Reports that the toe is purple in color and swollen. Reports that she came here directly from carpal tunnel and trigger release surgical repairs. Denies precipitating event or trauma.  Denies changes in soaps, detergents, close contacts with similar rash, known trigger or environmental trigger, allergy. Denies medications change or starting a new medication recently. Has not attempted OTC treatment. There are no aggravating or alleviating factors. Denies similar symptoms in the past. Denies fever, chills, nausea, vomiting, swelling, discharge, oral lesions, SOB, chest pain, abdominal pain, changes in bowel or bladder function.    ROS: As per HPI.  All other pertinent ROS negative.     Past Medical History:  Diagnosis Date  . Allergy   . Anemia    past history long ago  . Borderline HTN (hypertension)   . CAD (coronary artery disease)    a. 02/2020 Cath: LM nl, LAD large, 90p, 4m, D1 90p, LCX nl, OM 60-70p, RCA dominant w/ dampening upon engagement of ostium. 70p/m, RPDA 50. EF 55-65%.  . Disorder of kidney    has abd kidney (right)  . GERD (gastroesophageal reflux disease)   . History of diabetic gastroparesis   . History of echocardiogram    a. 01/2013 Echo: EF 55-65%, no rwma. Mild MR. Nl RV fxn.  Marland Kitchen HLD (hyperlipidemia)   . Hx: UTI (urinary tract infection)   . IBS (irritable bowel syndrome)   . Nephrolithiasis 2005  . Osteopenia 2012   per pt  . Systolic murmur 01/2013   mild mitral insuff  . Type 1 diabetes mellitus with diabetic retinopathy without macular edema (HCC)    dx at 59 y/o, background retinopathy   Past Surgical History:  Procedure Laterality Date  . CESAREAN SECTION  1985  . COLONOSCOPY  07/2016    WNL ([place])  . CORONARY ARTERY BYPASS GRAFT N/A 02/21/2020   Procedure: CORONARY ARTERY BYPASS GRAFTING (CABG) times five using left internal mammary and right leg saphenous vein;  Surgeon: Corliss Skains, MD;  Location: [facility] OR;  Service: Open Heart Surgery;  Laterality: N/A;  . ESOPHAGOGASTRODUODENOSCOPY  07/2016   LA Grade C reflux esophagitis, concern for stasis/dysmotility ([place])  . LAPAROSCOPIC APPENDECTOMY N/A 09/01/2015   Procedure: APPENDECTOMY LAPAROSCOPIC;  Surgeon: Jimmye Norman, MD;  Location: [facility] OR;  Service: General;  Laterality: N/A;  . LEFT HEART CATH AND CORONARY ANGIOGRAPHY N/A 02/17/2020   Procedure: LEFT HEART CATH AND CORONARY ANGIOGRAPHY;  Surgeon: Antonieta Iba, MD;  Location: [facility] INVASIVE CV LAB;  Service: Cardiovascular;  Laterality: N/A;  . LEFT HEART CATH AND CORS/GRAFTS ANGIOGRAPHY N/A 02/29/2020   Procedure: LEFT HEART CATH AND CORS/GRAFTS ANGIOGRAPHY;  Surgeon: [name], MD;  Location: [facility] INVASIVE CV LAB;  Service: Cardiovascular;  Laterality: N/A;  . ORIF FEMUR FRACTURE Left 1996   Corrected by patient in history 2016  . PARTIAL HYSTERECTOMY  2000   paps by [facility] [place]  . TEE WITHOUT CARDIOVERSION N/A 02/21/2020   Procedure: TRANSESOPHAGEAL ECHOCARDIOGRAM (TEE);  Surgeon: Corliss Skains, MD;  Location: [facility] OR;  Service: Open Heart Surgery;  Laterality: N/A;  . TONSILLECTOMY AND ADENOIDECTOMY  1975  . TRIGGER FINGER RELEASE Left 12/2010   thumb   Allergies  Allergen  Reactions  . Protonix [Pantoprazole] Swelling    Facial Swelling  . Toujeo Solostar [Insulin Glargine] Shortness Of Breath  . Lactose Intolerance (Gi)     GI  . Magnesium-Containing Compounds Swelling and Other (See Comments)    limbs  . Adhesive [Tape] Rash and Other (See Comments)    Band aids   No current facility-administered medications on file prior to encounter.   Current Outpatient Medications on File Prior to Encounter  Medication Sig Dispense  Refill  . aspirin 81 MG EC tablet Take 1 tablet (81 mg total) by mouth daily.    Marland Kitchen esomeprazole (NEXIUM) 40 MG capsule Take 1 capsule (40 mg total) by mouth daily. Patient needs office visit for further refills 90 capsule 0  . Evolocumab (REPATHA SURECLICK) 140 MG/ML SOAJ Inject 140 mg into the skin every 14 (fourteen) days. 2 pen 11  . glucagon (GLUCAGON EMERGENCY) 1 MG injection Inject 1 mg into the muscle once as needed (in case of severe hypoglycemia). 1 each 12  . Insulin Glargine (LANTUS SOLOSTAR) 100 UNIT/ML Solostar Pen Inject 12 Units into the skin daily. (Patient taking differently: Inject 8-10 Units into the skin See admin instructions. Inject 10 units into the skin in the morning and 8 units in the evening) 5 pen 11  . Insulin Pen Needle (NOVOFINE) 32G X 6 MM MISC USE TO INJECT INSULIN 4 TIMES A DAY 200 each 1  . insulin regular (NOVOLIN R) 100 units/mL injection INJECT 6 UNITS TOTAL INTO THE SKIN 3 (THREE) TIMES DAILY BEFORE MEALS. (Patient taking differently: Inject 1-10 Units into the skin See admin instructions. For glucose 150-210 add 1 additional unit, per scale 211-270= 2 units, 271-330= 3 units, 331-390= 4 units  For breakfast and dinner, administer 1 unit for every 10 grams of carbs. For snacks and dinner, administer 1 unit for every 20 grams of carbs.) 60 mL 1  . Insulin Syringe-Needle U-100 30G X 5/16" 0.3 ML MISC Use to inject insulin 6 times a day. 600 each 11  . Multiple Vitamin (MULTIVITAMIN) tablet Take 1 tablet by mouth daily.       Social History   Socioeconomic History  . Marital status: Married    Spouse name: Not on file  . Number of children: 1  . Years of education: 46  . Highest education level: Bachelor's degree (e.g., BA, AB, BS)  Occupational History  . Occupation: HR Chemical engineer: OTHER    Comment: Scientist, research (medical)  Tobacco Use  . Smoking status: Never Smoker  . Smokeless tobacco: Never Used  Vaping Use  . Vaping Use: Never used  Substance  and Sexual Activity  . Alcohol use: No    Alcohol/week: 0.0 standard drinks  . Drug use: No  . Sexual activity: Yes    Birth control/protection: None    Comment: Married  Other Topics Concern  . 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  Has one daughter.  Education: college.      1 dog   Social Determinants of Health   Financial Resource Strain:   . Difficulty of Paying Living Expenses: Not on file  Food Insecurity:   . Worried About Programme researcher, broadcasting/film/video in the Last Year: Not on file  . Ran Out of Food in the Last Year: Not on file  Transportation Needs:   . Lack  of Transportation (Medical): Not on file  . Lack of Transportation (Non-Medical): Not on file  Physical Activity:   . Days of Exercise per Week: Not on file  . Minutes of Exercise per Session: Not on file  Stress:   . Feeling of Stress : Not on file  Social Connections:   . Frequency of Communication with Friends and Family: Not on file  . Frequency of Social Gatherings with Friends and Family: Not on file  . Attends Religious Services: Not on file  . Active Member of Clubs or Organizations: Not on file  . Attends Banker Meetings: Not on file  . Marital Status: Not on file  Intimate Partner Violence:   . Fear of Current or Ex-Partner: Not on file  . Emotionally Abused: Not on file  . Physically Abused: Not on file  . Sexually Abused: Not on file   Family History  Problem Relation Age of Onset  . Diabetes Maternal Grandmother   . Heart failure Maternal Grandmother   . COPD Maternal Grandmother        smoker  . Thyroid disease Maternal Grandmother   . Vascular Disease Maternal Grandmother   . Colon cancer Maternal Grandfather   . Colon polyps Mother   . Breast cancer Other   . CAD Brother 52       stent  . Esophageal cancer Neg Hx   . Rectal cancer Neg Hx   . Stomach cancer Neg Hx      OBJECTIVE: Vitals:   07/12/20 1039  BP: 130/82  Pulse: 88  Resp: 18  Temp: 97.7 F (36.5 C)  TempSrc: Oral  SpO2: 98%    General appearance: alert; no distress Head: NCAT Lungs: clear to auscultation bilaterally Heart: regular rate and rhythm.  Radial pulse 2+ bilaterally Extremities: no edema Skin: warm and dry; right 2nd to erythematous, nontender, appears to have whitish color to the center, no drainage to the area Psychological: alert and cooperative; normal mood and affect  ASSESSMENT & PLAN:  1. Cellulitis of toe of right foot   2. Toe swelling     Meds ordered this encounter  Medications  . sulfamethoxazole-trimethoprim (BACTRIM DS) 800-160 MG tablet    Sig: Take 1 tablet by mouth 2 (two) times daily for 7 days.    Dispense:  14 tablet    Refill:  0    Order Specific Question:   Supervising Provider    Answer:   Merrilee Jansky [id number]    Prescribed Bactrim Take as prescribed and to completion Avoid hot showers/ baths Moisturize skin daily  Follow up with PCP if symptoms persists Return or go to the ER if you have any new or worsening symptoms such as fever, chills, nausea, vomiting, redness, swelling, discharge, if symptoms do not improve with medications  Reviewed expectations re: course of current medical issues. Questions answered. Outlined signs and symptoms indicating need for more acute intervention. Patient verbalized understanding. After Visit Summary given.   Moshe Cipro, NP 07/12/20 1120

## 2020-07-13 NOTE — Telephone Encounter (Signed)
Noted  

## 2020-07-14 NOTE — Telephone Encounter (Signed)
Lvm asking pt to call back.  Need an update on sxs.  

## 2020-07-17 NOTE — Telephone Encounter (Signed)
Spoke with pt asking for update.  States she is doing much better with abx.  Area looks a lot better.

## 2020-07-24 ENCOUNTER — Other Ambulatory Visit: Payer: Self-pay | Admitting: *Deleted

## 2020-07-24 DIAGNOSIS — R079 Chest pain, unspecified: Secondary | ICD-10-CM

## 2020-07-26 ENCOUNTER — Ambulatory Visit: Payer: BC Managed Care – PPO | Admitting: Family Medicine

## 2020-07-26 ENCOUNTER — Other Ambulatory Visit: Payer: Self-pay

## 2020-07-26 ENCOUNTER — Encounter: Payer: Self-pay | Admitting: Family Medicine

## 2020-07-26 VITALS — BP 132/70 | HR 74 | Temp 97.8°F | Ht 64.0 in | Wt 134.4 lb

## 2020-07-26 DIAGNOSIS — Z23 Encounter for immunization: Secondary | ICD-10-CM

## 2020-07-26 DIAGNOSIS — L03031 Cellulitis of right toe: Secondary | ICD-10-CM | POA: Insufficient documentation

## 2020-07-26 DIAGNOSIS — T8149XA Infection following a procedure, other surgical site, initial encounter: Secondary | ICD-10-CM | POA: Diagnosis not present

## 2020-07-26 MED ORDER — SULFAMETHOXAZOLE-TRIMETHOPRIM 800-160 MG PO TABS: 1.0000 | ORAL_TABLET | Freq: Two times a day (BID) | ORAL | 0 refills | Status: DC

## 2020-07-26 NOTE — Assessment & Plan Note (Addendum)
Improving but some persistent swelling and erythema - will Rx another 5 days of bactrim. She has tolerated well.

## 2020-07-26 NOTE — Assessment & Plan Note (Addendum)
R leg vein harvesting incision has fully healed but with some scarring. Discussed scar care.   Sternotomy incision with evidence of minimal keloid formation

## 2020-07-26 NOTE — Patient Instructions (Addendum)
Flu shot today.  Toe is better but still swollen and some red - do another 5 days of bactrim antibiotic. Also use warm compresses to toe to help stimulate circulation.

## 2020-07-26 NOTE — Progress Notes (Signed)
This visit was conducted in person.  BP 132/70 (BP Location: Left Arm, Patient Position: Sitting, Cuff Size: Normal)   Pulse 74   Temp 97.8 F (36.6 C) (Temporal)   Ht 5\' 4"  (1.626 m)   Wt 134 lb 7 oz (61 kg)   SpO2 99%   BMI 23.08 kg/m    CC: UCC f/u visit  Subjective:    Patient ID: Felicia [name], female    DOB: 07/23/1961, 59 y.o.   MRN: [id number]  HPI: Felicia [name] is a 59 y.o. female presenting on 07/26/2020 for Hospitalization Follow-up (Seen at [facility] on 07/12/20.  States 2nd right toe is still swollen and a little has some redness. ) and Wound Check (Wants incision sites checked from open heart surgery. )   Recent eval at [facility] on 07/12/2020 for R 2nd toe cellulitis treated with bactrim 1 BID x 1 wk. She finished abx 2d ago - erythema has much improved but area remains swollen but not tender or itchy - never inciting trauma/injury. This happened after she was outdoors in her yard barefoot. Tolerated abx well.   No fevers/chills, nausea, streaking redness.   Ongoing scarring to R leg vein harvest surgery incision - has been treating vit E cream. Mederma cream caused irritation.   Just had L CTR surgery.      Relevant past medical, surgical, family and social history reviewed and updated as indicated. Interim medical history since our last visit reviewed. Allergies and medications reviewed and updated. Outpatient Medications Prior to Visit  Medication Sig Dispense Refill  . aspirin 81 MG EC tablet Take 1 tablet (81 mg total) by mouth daily.    09/11/2020 esomeprazole (NEXIUM) 40 MG capsule Take 1 capsule (40 mg total) by mouth daily. Patient needs office visit for further refills 90 capsule 0  . Evolocumab (REPATHA SURECLICK) 140 MG/ML SOAJ Inject 140 mg into the skin every 14 (fourteen) days. 2 pen 11  . glucagon (GLUCAGON EMERGENCY) 1 MG injection Inject 1 mg into the muscle once as needed (in case of severe hypoglycemia). 1 each 12  . Insulin Glargine (LANTUS  SOLOSTAR) 100 UNIT/ML Solostar Pen Inject 12 Units into the skin daily. (Patient taking differently: Inject 8-10 Units into the skin See admin instructions. Inject 10 units into the skin in the morning and 8 units in the evening) 5 pen 11  . Insulin Pen Needle (NOVOFINE) 32G X 6 MM MISC USE TO INJECT INSULIN 4 TIMES A DAY 200 each 1  . insulin regular (NOVOLIN R) 100 units/mL injection INJECT 6 UNITS TOTAL INTO THE SKIN 3 (THREE) TIMES DAILY BEFORE MEALS. (Patient taking differently: Inject 1-10 Units into the skin See admin instructions. For glucose 150-210 add 1 additional unit, per scale 211-270= 2 units, 271-330= 3 units, 331-390= 4 units  For breakfast and dinner, administer 1 unit for every 10 grams of carbs. For snacks and dinner, administer 1 unit for every 20 grams of carbs.) 60 mL 1  . Insulin Syringe-Needle U-100 30G X 5/16" 0.3 ML MISC Use to inject insulin 6 times a day. 600 each 11  . Multiple Vitamin (MULTIVITAMIN) tablet Take 1 tablet by mouth daily.       No facility-administered medications prior to visit.     Per HPI unless specifically indicated in ROS section below Review of Systems Objective:  BP 132/70 (BP Location: Left Arm, Patient Position: Sitting, Cuff Size: Normal)   Pulse 74   Temp 97.8 F (36.6 C) (  Temporal)   Ht 5\' 4"  (1.626 m)   Wt 134 lb 7 oz (61 kg)   SpO2 99%   BMI 23.08 kg/m   Wt Readings from Last 3 Encounters:  07/26/20 134 lb 7 oz (61 kg)  05/19/20 131 lb 3.2 oz (59.5 kg)  04/21/20 132 lb (59.9 kg)      Physical Exam Vitals and nursing note reviewed.  Constitutional:      Appearance: Normal appearance. She is not ill-appearing.  Musculoskeletal:     Right lower leg: No edema.     Left lower leg: No edema.     Comments:  2+ DP bilaterally FROM at toes, no significant pain with axial loading  Skin:    General: Skin is warm and dry.     Findings: Erythema (slight to R 2nd toe) present. No rash.     Comments: Slight keloid to midline chest  sternotomy incision  Neurological:     Mental Status: She is alert.  Psychiatric:        Mood and Affect: Mood normal.        Behavior: Behavior normal.       Lab Results  Component Value Date   CREATININE 0.97 03/01/2020   BUN 11 03/01/2020   NA 139 03/01/2020   K 4.7 03/01/2020   CL 104 03/01/2020   CO2 23 03/01/2020    Assessment & Plan:  This visit occurred during the SARS-CoV-2 public health emergency.  Safety protocols were in place, including screening questions prior to the visit, additional usage of staff PPE, and extensive cleaning of exam room while observing appropriate contact time as indicated for disinfecting solutions.   Problem List Items Addressed This Visit    Inflammation of operative incision    R leg vein harvesting incision has fully healed but with some scarring. Discussed scar care.   Sternotomy incision with evidence of minimal keloid formation      Cellulitis of toe of right foot - Primary    Improving but some persistent swelling and erythema - will Rx another 5 days of bactrim. She has tolerated well.        Other Visit Diagnoses    Need for influenza vaccination       Relevant Orders   Flu Vaccine QUAD 36+ mos IM (Completed)       Meds ordered this encounter  Medications  . sulfamethoxazole-trimethoprim (BACTRIM DS) 800-160 MG tablet    Sig: Take 1 tablet by mouth 2 (two) times daily.    Dispense:  10 tablet    Refill:  0   Orders Placed This Encounter  Procedures  . Flu Vaccine QUAD 36+ mos IM    Patient Instructions  Flu shot today.  Toe is better but still swollen and some red - do another 5 days of bactrim antibiotic. Also use warm compresses to toe to help stimulate circulation.    Follow up plan: Return if symptoms worsen or fail to improve.  03/03/2020, MD

## 2020-07-28 ENCOUNTER — Encounter: Payer: Self-pay | Admitting: Cardiovascular Disease

## 2020-07-28 ENCOUNTER — Other Ambulatory Visit: Payer: Self-pay

## 2020-07-28 ENCOUNTER — Ambulatory Visit (INDEPENDENT_AMBULATORY_CARE_PROVIDER_SITE_OTHER): Payer: BC Managed Care – PPO | Admitting: Cardiovascular Disease

## 2020-07-28 ENCOUNTER — Ambulatory Visit (INDEPENDENT_AMBULATORY_CARE_PROVIDER_SITE_OTHER): Payer: BC Managed Care – PPO | Admitting: Internal Medicine

## 2020-07-28 ENCOUNTER — Encounter: Payer: Self-pay | Admitting: Internal Medicine

## 2020-07-28 VITALS — BP 136/80 | HR 76 | Ht 64.0 in | Wt 136.0 lb

## 2020-07-28 VITALS — BP 141/71 | HR 76 | Ht 64.0 in | Wt 135.8 lb

## 2020-07-28 DIAGNOSIS — E108 Type 1 diabetes mellitus with unspecified complications: Secondary | ICD-10-CM

## 2020-07-28 DIAGNOSIS — E103293 Type 1 diabetes mellitus with mild nonproliferative diabetic retinopathy without macular edema, bilateral: Secondary | ICD-10-CM

## 2020-07-28 DIAGNOSIS — E785 Hyperlipidemia, unspecified: Secondary | ICD-10-CM

## 2020-07-28 DIAGNOSIS — I25708 Atherosclerosis of coronary artery bypass graft(s), unspecified, with other forms of angina pectoris: Secondary | ICD-10-CM

## 2020-07-28 DIAGNOSIS — E1069 Type 1 diabetes mellitus with other specified complication: Secondary | ICD-10-CM | POA: Diagnosis not present

## 2020-07-28 DIAGNOSIS — E7800 Pure hypercholesterolemia, unspecified: Secondary | ICD-10-CM | POA: Diagnosis not present

## 2020-07-28 LAB — POCT GLYCOSYLATED HEMOGLOBIN (HGB A1C): Hemoglobin A1C: 7 % — AB (ref 4.0–5.6)

## 2020-07-28 MED ORDER — ISOSORBIDE MONONITRATE ER 30 MG PO TB24: 30.0000 mg | ORAL_TABLET | Freq: Every day | ORAL | 3 refills | Status: DC

## 2020-07-28 NOTE — Patient Instructions (Addendum)
Please decrease: - Lantus 6 units in am and 6 units at bedtime  - R insulin: - insulin to carb ratio (ICR)   B'fast: 10:1  Lunch: 10:1   Dinner: 20:1  - target 150 - insulin sensitivity factor (ISF) 60: 150-200: + 1 unit 201-260: + 2 units 261-320: + 3 units 321-380: + 4 units >380: + 5 units Do not correct bedtime sugars <300, and only inject 1 unit then.   Please increase Fish oil to 1000 mg 2x a day.  Please return in 3-4 months with your sugar log.

## 2020-07-28 NOTE — Progress Notes (Signed)
Patient ID: TINEY ZIPPER, female   DOB: [birth year], 59 y.o.   MRN: [id number]  This visit occurred during the SARS-CoV-2 public health emergency.  Safety protocols were in place, including screening questions prior to the visit, additional usage of staff PPE, and extensive cleaning of exam room while observing appropriate contact time as indicated for disinfecting solutions.   HPI: Felicia [name] is a 58 y.o.-year-old female, presenting for follow-up for DM1, dx in 3 (age 4), uncontrolled, with complications (CAD - s/p 5v CABG, mild retinopathy, gastroparesis). Last visit 3.5 months ago.  In 01/2020 >> started exercise >> started to have CP >> saw PCP - BP very high >> sent to ED >> AMI r/o >> sent to see cardiology in OP >> had cath >> 9 blockages >> CABG 02/21/2020 >> went home >> she had a syncopal episode in 02/25/2020 (fell and fractured orbit, wrist) -2/2 hypotension >> another catheterization >> 4/5 bypasses failed.  As of now, she is followed conservatively with plan for PCI if chest pain recurs.  She finished cardiac rehab 2 weeks ago.  However, she tells me that she has Chest pressure for the last week.  She did let cardiology know about this and has an appointment today.  A 2D echo is planned.  She had hand sx 2 weeks ago.  Her sugars improved significantly after her CABG surgery.  Reviewed her HbA1c levels: Lab Results  Component Value Date   HGBA1C 8.9 (H) 02/17/2020   HGBA1C 8.5 (H) 09/23/2019   HGBA1C 9.0 (H) 06/17/2019   She was previously on insulin pump for 5 years (~2010) but she did not like it.  Sugars were not better on this.  She is not interested in getting back on the pump.    She is on: - Tresiba 12 units in am >> ... >> Lantus 8 in a.m. and 6 units at bedtime  - R insulin: - insulin to carb ratio (ICR) - 2-3 units/meal  B'fast: 10:1  Lunch: 10:1   Dinner: 20:1 (no more than 2-3 units) - target 150 - insulin sensitivity factor (ISF) 60: 150-200: + 1  unit 201-260: + 2 units 261-320: + 3 units 321-380: + 4 units >380: + 5 units Do not correct bedtime sugars <300, and only inject 1 unit then.  For the snack at night, do not take more than 1-2 units of regular insulin!  We tried Metformin ER 1000 mg with dinner but stopped due to lack of effect. Tried [place] >> she did not feel that this worked as well as Lantus. Tried Toujeo >> allergy: CP, SOB.  Meter: ReliOn  She checks her sugars 4x a day: - am: 175-343 >> 51, 59-157, 191, 229 >> 85-207, 227, 267 - ate during the night - 2h after b'fast: n/c >> 87-203 >> n/c - lunch: 146, 236-342 >> 81-192, 264 >> 44, 45, 75-171, 331 - 2h after lunch: n/c >> 62 >> n/c - dinner: 196-303, 436 >> 71-174, 260, 265 >> 49-159, 239 - 2h after dinner: n/c >> 83 >> n/c - bedtime: 83-203 >> 65-208, 253, 266 >> 67-119, 139 - night: 55, 150 >> n/c  Lowest sugar was 38 >>... 44 >> 55 >> 51 >> 45; + hypoglycemia awareness in the 70s.  No recent hypo or hyperglycemia admissions.  She has a glucagon kit at home. Highest sugar was 500s (bagel) >> 436 >> 266 >> 331.   Pt's meals are: - Breakfast: protein bar + fruit >> granola  bar (25 g carbs) - 10 am snack  - Lunch: apple + cheese, carrots - Snack bar: 15g carbs - Glucerna - Dinner: meat + 1/2 backed potato + veggies/salad - Snacks: popcorn, salty snacks: Potato chips  -No CKD, last BUN/creatinine:  Lab Results  Component Value Date   BUN 11 03/01/2020   CREATININE 0.97 03/01/2020  Off quinapril. + HL; last set of lipids: Lab Results  Component Value Date   CHOL 142 06/09/2020   HDL 50.70 06/09/2020   LDLCALC 59 06/09/2020   LDLDIRECT 120.0 06/17/2019   TRIG 163.0 (H) 06/09/2020   CHOLHDL 3 06/09/2020  In 04/2016, we started pravastatin, but she had to come off due to muscle cramps.  CoQ10 did not help much.  Also tried Zetia, but now off.  She was on lovastatin but this was switched to [place]. She also uses uses 2 creams: Theraworx + Muscle  calm.   - last eye exam was on 11/18/2019: + DR -No numbness and tingling in her feet.  On ASA 81.  Latest TSH was normal: Lab Results  Component Value Date   TSH 2.744 02/20/2020   She has a history of HTN.  ROS: Constitutional: no weight gain/no weight loss, no fatigue, no subjective hyperthermia, no subjective hypothermia Eyes: no blurry vision, no xerophthalmia ENT: no sore throat, no nodules palpated in neck, no dysphagia, no odynophagia, no hoarseness Cardiovascular: + CP - improved/no SOB/no palpitations/no leg swelling Respiratory: no cough/no SOB/no wheezing Gastrointestinal: no N/no V/no D/no C/no acid reflux Musculoskeletal: + muscle aches/no joint aches Skin: no rashes, no hair loss Neurological: no tremors/no numbness/no tingling/no dizziness  I reviewed pt's medications, allergies, PMH, social hx, family hx, and changes were documented in the history of present illness. Otherwise, unchanged from my initial visit note.  Past Medical History:  Diagnosis Date  . Allergy   . Anemia    past history long ago  . Borderline HTN (hypertension)   . CAD (coronary artery disease)    a. 02/2020 Cath: LM nl, LAD large, 90p, 82m, D1 90p, LCX nl, OM 60-70p, RCA dominant w/ dampening upon engagement of ostium. 70p/m, RPDA 50. EF 55-65%.  . Disorder of kidney    has abd kidney (right)  . GERD (gastroesophageal reflux disease)   . History of diabetic gastroparesis   . History of echocardiogram    a. 01/2013 Echo: EF 55-65%, no rwma. Mild MR. Nl RV fxn.  Marland Kitchen HLD (hyperlipidemia)   . Hx: UTI (urinary tract infection)   . IBS (irritable bowel syndrome)   . Nephrolithiasis 2005  . Osteopenia 2012   per pt  . Systolic murmur 06/1447   mild mitral insuff  . Type 1 diabetes mellitus with diabetic retinopathy without macular edema (HCC)    dx at 59 y/o, background retinopathy   Past Surgical History:  Procedure Laterality Date  . CESAREAN SECTION  1985  . COLONOSCOPY  07/2016    WNL (Pyrtle)  . CORONARY ARTERY BYPASS GRAFT N/A 02/21/2020   Procedure: CORONARY ARTERY BYPASS GRAFTING (CABG) times five using left internal mammary and right leg saphenous vein;  Surgeon: Lajuana Matte, MD;  Location: [place];  Service: Open Heart Surgery;  Laterality: N/A;  . ESOPHAGOGASTRODUODENOSCOPY  07/2016   LA Grade C reflux esophagitis, concern for stasis/dysmotility ([place])  . LAPAROSCOPIC APPENDECTOMY N/A 09/01/2015   Procedure: APPENDECTOMY LAPAROSCOPIC;  Surgeon: Judeth Horn, MD;  Location: [place];  Service: General;  Laterality: N/A;  . LEFT HEART CATH AND  CORONARY ANGIOGRAPHY N/A 02/17/2020   Procedure: LEFT HEART CATH AND CORONARY ANGIOGRAPHY;  Surgeon: Minna Merritts, MD;  Location: [employer] [facility];  Service: Cardiovascular;  Laterality: N/A;  . LEFT HEART CATH AND CORS/GRAFTS ANGIOGRAPHY N/A 02/29/2020   Procedure: LEFT HEART CATH AND CORS/GRAFTS ANGIOGRAPHY;  Surgeon: Burnell Blanks, MD;  Location: [place] [facility];  Service: Cardiovascular;  Laterality: N/A;  . ORIF FEMUR FRACTURE Left 1996   Corrected by patient in history 2016  . PARTIAL HYSTERECTOMY  2000   paps by [facility] [place]  . TEE WITHOUT CARDIOVERSION N/A 02/21/2020   Procedure: TRANSESOPHAGEAL ECHOCARDIOGRAM (TEE);  Surgeon: Lajuana Matte, MD;  Location: [place];  Service: Open Heart Surgery;  Laterality: N/A;  . TONSILLECTOMY AND ADENOIDECTOMY  1975  . TRIGGER FINGER RELEASE Left 12/2010   thumb   Social History   Socioeconomic History  . Marital status: Married    Spouse name: Not on file  . Number of children: 1  . Years of education: 45  . Highest education level: Bachelor's degree (e.g., BA, AB, BS)  Occupational History  . 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 Smoking status: Never Smoker  . Smokeless tobacco: Never Used  Vaping Use  . Vaping Use: Never used  Substance and Sexual Activity  . Alcohol use: No     Alcohol/week: 0.0 standard drinks  . Drug use: No  . Sexual activity: Yes    Birth control/protection: None    Comment: Married  Other Topics Concern  . 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  Has one daughter.  Education: college.      1 dog   Social Determinants of Health   Financial Resource Strain:   . Difficulty of Paying Living Expenses: Not on file  Food Insecurity:   . Worried About Charity fundraiser in the Last Year: Not on file  . Ran Out of Food in the Last Year: Not on file  Transportation Needs:   . Lack of Transportation (Medical): Not on file  . Lack of Transportation (Non-Medical): Not on file  Physical Activity:   . Days of Exercise per Week: Not on file  . Minutes of Exercise per Session: Not on file  Stress:   . Feeling of Stress : Not on file  Social Connections:   . Frequency of Communication with Friends and Family: Not on file  . Frequency of Social Gatherings with Friends and Family: Not on file  . Attends Religious Services: Not on file  . Active Member of Clubs or Organizations: Not on file  . Attends Archivist Meetings: Not on file  . Marital Status: Not on file  Intimate Partner Violence:   . Fear of Current or Ex-Partner: Not on file  . Emotionally Abused: Not on file  . Physically Abused: Not on file  . Sexually Abused: Not on file   Current Outpatient Medications on File Prior to Visit  Medication Sig Dispense Refill  . aspirin 81 MG EC tablet Take 1 tablet (81 mg total) by mouth daily.    Marland Kitchen esomeprazole (NEXIUM) 40 MG capsule Take 1 capsule (40 mg total) by mouth daily. Patient needs office visit for further refills 90 capsule 0  . Evolocumab (REPATHA SURECLICK) 371 MG/ML SOAJ  Inject 140 mg into the skin every 14 (fourteen) days. 2 pen 11  . glucagon (GLUCAGON EMERGENCY) 1 MG injection Inject 1 mg into the muscle once as needed  (in case of severe hypoglycemia). 1 each 12  . Insulin Glargine (LANTUS SOLOSTAR) 100 UNIT/ML Solostar Pen Inject 12 Units into the skin daily. (Patient taking differently: Inject 8-10 Units into the skin See admin instructions. Inject 10 units into the skin in the morning and 8 units in the evening) 5 pen 11  . Insulin Pen Needle (NOVOFINE) 32G X 6 MM MISC USE TO INJECT INSULIN 4 TIMES A DAY 200 each 1  . insulin regular (NOVOLIN R) 100 units/mL injection INJECT 6 UNITS TOTAL INTO THE SKIN 3 (THREE) TIMES DAILY BEFORE MEALS. (Patient taking differently: Inject 1-10 Units into the skin See admin instructions. For glucose 150-210 add 1 additional unit, per scale 211-270= 2 units, 271-330= 3 units, 331-390= 4 units  For breakfast and dinner, administer 1 unit for every 10 grams of carbs. For snacks and dinner, administer 1 unit for every 20 grams of carbs.) 60 mL 1  . Insulin Syringe-Needle U-100 30G X 5/16" 0.3 ML MISC Use to inject insulin 6 times a day. 600 each 11  . Multiple Vitamin (MULTIVITAMIN) tablet Take 1 tablet by mouth daily.      Marland Kitchen sulfamethoxazole-trimethoprim (BACTRIM DS) 800-160 MG tablet Take 1 tablet by mouth 2 (two) times daily. 10 tablet 0   No current facility-administered medications on file prior to visit.   Allergies  Allergen Reactions  . Protonix [Pantoprazole] Swelling    Facial Swelling  . Toujeo Solostar [Insulin Glargine] Shortness Of Breath  . Lactose Intolerance (Gi)     GI  . Magnesium-Containing Compounds Swelling and Other (See Comments)    limbs  . Adhesive [Tape] Rash and Other (See Comments)    Band aids   Family History  Problem Relation Age of Onset  . Diabetes Maternal Grandmother   . Heart failure Maternal Grandmother   . COPD Maternal Grandmother        smoker  . Thyroid disease Maternal Grandmother   . Vascular Disease Maternal Grandmother   . Colon cancer Maternal Grandfather   . Colon polyps Mother   . Breast cancer Other   . CAD Brother  25       stent  . Esophageal cancer Neg Hx   . Rectal cancer Neg Hx   . Stomach cancer Neg Hx    PE: BP 136/80 (BP Location: Left Arm, Patient Position: Sitting, Cuff Size: Normal)   Pulse 76   Ht _0  (1.626 m)   Wt 136 lb (61.7 kg)   SpO2 99%   BMI 23.34 kg/m  Body mass index is 23.34 kg/m.  Wt Readings from Last 3 Encounters:  07/28/20 136 lb (61.7 kg)  07/26/20 134 lb 7 oz (61 kg)  05/19/20 131 lb 3.2 oz (59.5 kg)   Constitutional: normal weight, in NAD Eyes: PERRLA, EOMI, no exophthalmos ENT: moist mucous membranes, no thyromegaly, no cervical lymphadenopathy Cardiovascular: RRR, No MRG Respiratory: CTA B Gastrointestinal: abdomen soft, NT, ND, BS+ Musculoskeletal: no deformities, strength intact in all 4 Skin: moist, warm, no rashes Neurological: no tremor with outstretched hands, DTR normal in all 4   ASSESSMENT: 1. DM1, uncontrolled, with complications - CAD - s/p 5v CABG - 02/21/2020 - Mild DR - Gastroparesis - This is the reason why she is on regular insulin, and she is taking it 10 minutes  rather than 30 minutes before eating  She had low CBGs with Novolog!  2. HL  PLAN:  1.  This is a complex patient, with difficult to control, longstanding, type 1 diabetes, on basal-bolus insulin regimen.  She tried an insulin pump in the past but did not like it.  She recently obtained a Dexcom CGM but this was not attached at last visit.  She still did not attach it at this visit.  She mentions that she is reticent to start it due to body image (scarring from insulin injections).  -At last visit, sugars improved significantly after her CABG and they were dropping to the 50s and 60s, with occasional higher blood sugars in the 200s but overall, much improved control.  She still had some lower blood sugars after meals and she was only taking approximately 2 units of Humalog with every meal so we decreased her a.m. Lantus dose from 10 units to 8 units in the days without  cardiac rehab and possibly even 6 units in the days that she had cardiac rehab. -At today's visit, we reviewed her detailed log.  Sugars remain better controlled than before, however, she does have some hyperglycemic spikes in the morning, in the 200s, after she woke up and ate in the middle of the night.  She also has one blood sugar in the 300s, but she cannot explain this value.  Before lunch she has a few blood sugars in the 40s.  Her sugars later in the day are more controlled, as always. -Due to the low blood sugars before lunch, I advised her to decrease the Lantus to 6 units in the morning. -She continues to be active, walking twice a day and working on improving her diet.  She is limiting white starches. -At today's visit, we discussed about the benefits of using the CGM especially since she has this at home.  With her history of hypoglycemia, it would be extremely important to find out what the sugars are before they actually dropped to the 40s.  I explained that every hypoglycemia episode can increase her risk of arrhythmia and ischemia.  I strongly advised her to reconsider attaching the CGM. - I suggested to:  Patient Instructions  Please decrease: - Lantus 6 units in am and 6 units at bedtime  - R insulin: - insulin to carb ratio (ICR)   B'fast: 10:1  Lunch: 10:1   Dinner: 20:1  - target 150 - insulin sensitivity factor (ISF) 60: 150-200: + 1 unit 201-260: + 2 units 261-320: + 3 units 321-380: + 4 units >380: + 5 units Do not correct bedtime sugars <300, and only inject 1 unit then.   Please increase Fish oil to 1000 mg 2x a day.  Please return in 3-4 months with your sugar log.   - we checked her HbA1c: 7.0% (much better) - advised to check sugars at different times of the day - 4x a day, rotating check times - advised for yearly eye exams >> she is UTD - return to clinic in 3-4 months     2. HL -Reviewed latest lipid panel from 06/2020: LDL much improved, at goal,  triglycerides slightly high: Lab Results  Component Value Date   CHOL 142 06/09/2020   HDL 50.70 06/09/2020   LDLCALC 59 06/09/2020   LDLDIRECT 120.0 06/17/2019   TRIG 163.0 (H) 06/09/2020   CHOLHDL 3 06/09/2020  -On Repatha -without side effects -She is also on 500 mg fish oil daily.  I  advised her that this is a low dose and advised her to increase it to 1000 mg twice a day to improve her triglycerides.  Philemon Kingdom, MD PhD [facility] Endocrinology

## 2020-07-28 NOTE — Patient Instructions (Signed)
Medication Instructions:  BEGIN TAKING ISOSORBIDE MONONITRATE 30 MG ONCE A DAY  *If you need a refill on your cardiac medications before your next appointment, please call your pharmacy*   Lab Work: NONE  If you have labs (blood work) drawn today and your tests are completely normal, you will receive your results only by: Marland Kitchen MyChart Message (if you have MyChart) OR . A paper copy in the mail If you have any lab test that is abnormal or we need to change your treatment, we will call you to review the results.   Testing/Procedures: Your physician has requested that you have a lexiscan myoview. For further information please visit [email or web address] Please follow instruction sheet, as given.  PLEASE HOLD ALL DIABETIC MEDICATIONS UNTIL AFTER YOUR TEST WHEN YOU HAVE A MEAL  THIS TEST WILL BE PERFORMED AT [address]   Follow-Up: At [facility], you and your health needs are our priority.  As part of our continuing mission to provide you with exceptional heart care, we have created designated Provider Care Teams.  These Care Teams include your primary Cardiologist (physician) and Advanced Practice Providers (APPs -  Physician Assistants and Nurse Practitioners) who all work together to provide you with the care you need, when you need it.  We recommend signing up for the patient portal called "MyChart".  Sign up information is provided on this After Visit Summary.  MyChart is used to connect with patients for Virtual Visits (Telemedicine).  Patients are able to view lab/test results, encounter notes, upcoming appointments, etc.  Non-urgent messages can be sent to your provider as well.   To learn more about what you can do with MyChart, go to [email or web address].    Your next appointment:   6-8 week(s)  The format for your next appointment:   In Person  Provider:   Thurmon Fair, MD   Other Instructions NONE

## 2020-07-28 NOTE — Progress Notes (Signed)
Cardiology Office Note:    Date:  07/28/2020   ID:  Felicia [name], DOB 22-Nov-1960, MRN [id number]  PCP:  [name], MD  [facility] Cardiologist:  Thurmon Fair, MD  [facility] Electrophysiologist:  None   Referring MD: [name], MD   No chief complaint on file.   History of Present Illness:    Felicia [name] is a 59 y.o. female with a hx of CAD s/p CABG April 2021, with early vein graft occlusion but patent LIMA to LAD, type 1 diabetes mellitus on insulin, hypercholesterolemia intolerant to statins on PCSK9 inhibitor.  Early postoperative course was complicated by an episode of syncope with fall and R orbital fracture and left wrist fracture and coronary/graft angiography showed that she had lost patency of all the SVGs.  The LIMA to LAD bypass was patent.  No intervention was performed.  Echocardiogram showed that left ventricular wall motion was abnormal, significant for moderate hypokinesis of the apical segments of the anterior and anterolateral walls, but overall LVEF was normal at 55 to 60%.  Diagnostic Dominance: Right           She has completed cardiac rehab, has had successful surgical repair of her wrist and is engaged in a program of daily exercise.  Over the last several weeks she has had a constant sensation of constriction in her chest  (" if my heart was in a zip lock bag- the bag got filled with water and it feel full/or heavy. It is not a pressure feeling per say nor does it hurt- it just feels heavy around my heart/chest area.  I am not having any shortness of breathing or pain. This is not worsened by activity and has not stopped her from doing anything.  She had an echocardiogram in August did not show any evidence of regional wall motion abnormalities or of a pericardial effusion.  She does not have exertional dyspnea.  She has not had palpitations, dizziness, syncope or signs of orthostatic hypotension.   She has kept a daily log of  her heart rate and blood pressure.  Her systolic blood pressure has typically been in the high 110s-120s and her diastolic blood pressure in the mid-high 60s.  Her heart rate is in the 60s and 70s.  Her most recent LDL cholesterol was 59 on treatment with Repatha.  She reports that glucose control has been good, and this is reflected in her most recent hemoglobin A1c which is now much better at just 7.0%, down from 8.9% in April.  Past Medical History:  Diagnosis Date  . Allergy   . Anemia    past history long ago  . Borderline HTN (hypertension)   . CAD (coronary artery disease)    a. 02/2020 Cath: LM nl, LAD large, 90p, 81m, D1 90p, LCX nl, OM 60-70p, RCA dominant w/ dampening upon engagement of ostium. 70p/m, RPDA 50. EF 55-65%.  . Disorder of kidney    has abd kidney (right)  . GERD (gastroesophageal reflux disease)   . History of diabetic gastroparesis   . History of echocardiogram    a. 01/2013 Echo: EF 55-65%, no rwma. Mild MR. Nl RV fxn.  Marland Kitchen HLD (hyperlipidemia)   . Hx: UTI (urinary tract infection)   . IBS (irritable bowel syndrome)   . Nephrolithiasis 2005  . Osteopenia 2012   per pt  . Systolic murmur 01/2013   mild mitral insuff  . Type 1 diabetes mellitus with diabetic retinopathy without macular edema (  HCC)    dx at 59 y/o, background retinopathy    Past Surgical History:  Procedure Laterality Date  . CESAREAN SECTION  1985  . COLONOSCOPY  07/2016   WNL (Pyrtle)  . CORONARY ARTERY BYPASS GRAFT N/A 02/21/2020   Procedure: CORONARY ARTERY BYPASS GRAFTING (CABG) times five using left internal mammary and right leg saphenous vein;  Surgeon: Corliss Skains, MD;  Location: [facility] OR;  Service: Open Heart Surgery;  Laterality: N/A;  . ESOPHAGOGASTRODUODENOSCOPY  07/2016   LA Grade C reflux esophagitis, concern for stasis/dysmotility (Pyrtle)  . LAPAROSCOPIC APPENDECTOMY N/A 09/01/2015   Procedure: APPENDECTOMY LAPAROSCOPIC;  Surgeon: Jimmye Norman, MD;  Location: [facility] OR;   Service: General;  Laterality: N/A;  . LEFT HEART CATH AND CORONARY ANGIOGRAPHY N/A 02/17/2020   Procedure: LEFT HEART CATH AND CORONARY ANGIOGRAPHY;  Surgeon: Antonieta Iba, MD;  Location: [facility] INVASIVE CV LAB;  Service: Cardiovascular;  Laterality: N/A;  . LEFT HEART CATH AND CORS/GRAFTS ANGIOGRAPHY N/A 02/29/2020   Procedure: LEFT HEART CATH AND CORS/GRAFTS ANGIOGRAPHY;  Surgeon: [name], MD;  Location: [facility] INVASIVE CV LAB;  Service: Cardiovascular;  Laterality: N/A;  . ORIF FEMUR FRACTURE Left 1996   Corrected by patient in history 2016  . PARTIAL HYSTERECTOMY  2000   paps by [facility] [place]  . TEE WITHOUT CARDIOVERSION N/A 02/21/2020   Procedure: TRANSESOPHAGEAL ECHOCARDIOGRAM (TEE);  Surgeon: Corliss Skains, MD;  Location: [facility] OR;  Service: Open Heart Surgery;  Laterality: N/A;  . TONSILLECTOMY AND ADENOIDECTOMY  1975  . TRIGGER FINGER RELEASE Left 12/2010   thumb    Current Medications: Current Meds  Medication Sig  . aspirin 81 MG EC tablet Take 1 tablet (81 mg total) by mouth daily.  Marland Kitchen esomeprazole (NEXIUM) 40 MG capsule Take 1 capsule (40 mg total) by mouth daily. Patient needs office visit for further refills  . Evolocumab (REPATHA SURECLICK) 140 MG/ML SOAJ Inject 140 mg into the skin every 14 (fourteen) days.  Marland Kitchen glucagon (GLUCAGON EMERGENCY) 1 MG injection Inject 1 mg into the muscle once as needed (in case of severe hypoglycemia).  . Insulin Glargine (LANTUS SOLOSTAR) 100 UNIT/ML Solostar Pen Inject 12 Units into the skin daily. (Patient taking differently: Inject 8-10 Units into the skin See admin instructions. Inject 10 units into the skin in the morning and 8 units in the evening)  . Insulin Pen Needle (NOVOFINE) 32G X 6 MM MISC USE TO INJECT INSULIN 4 TIMES A DAY  . insulin regular (NOVOLIN R) 100 units/mL injection INJECT 6 UNITS TOTAL INTO THE SKIN 3 (THREE) TIMES DAILY BEFORE MEALS. (Patient taking differently: Inject 1-10 Units into the skin  See admin instructions. For glucose 150-210 add 1 additional unit, per scale 211-270= 2 units, 271-330= 3 units, 331-390= 4 units  For breakfast and dinner, administer 1 unit for every 10 grams of carbs. For snacks and dinner, administer 1 unit for every 20 grams of carbs.)  . Insulin Syringe-Needle U-100 30G X 5/16" 0.3 ML MISC Use to inject insulin 6 times a day.  . Multiple Vitamin (MULTIVITAMIN) tablet Take 1 tablet by mouth daily.    Marland Kitchen sulfamethoxazole-trimethoprim (BACTRIM DS) 800-160 MG tablet Take 1 tablet by mouth 2 (two) times daily.     Allergies:   Protonix [pantoprazole], Toujeo solostar [insulin glargine], Lactose intolerance (gi), Magnesium-containing compounds, and Adhesive [tape]   Social History   Socioeconomic History  . Marital status: Married    Spouse name: Not on file  . Number  of children: 1  . Years of education: 6516  . Highest education level: Bachelor's degree (e.g., BA, AB, BS)  Occupational History  . Occupation: HR Chemical engineerdirector    Employer: OTHER    Comment: Scientist, research ([employer]  Tobacco Use  . Smoking status: Never Smoker  . Smokeless tobacco: Never Used  Vaping Use  . Vaping Use: Never used  Substance and Sexual Activity  . Alcohol use: No    Alcohol/week: 0.0 standard drinks  . Drug use: No  . Sexual activity: Yes    Birth control/protection: None    Comment: Married  Other Topics Concern  . 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  Has one daughter.  Education: college.      1 dog   Social Determinants of Health   Financial Resource Strain:   . Difficulty of Paying Living Expenses: Not on file  Food Insecurity:   . Worried About Programme researcher, broadcasting/film/videounning Out of Food in the Last Year: Not on file  . Ran Out of Food in the Last Year: Not on file  Transportation Needs:   . Lack of Transportation (Medical): Not on file  . Lack of Transportation (Non-Medical): Not on file   Physical Activity:   . Days of Exercise per Week: Not on file  . Minutes of Exercise per Session: Not on file  Stress:   . Feeling of Stress : Not on file  Social Connections:   . Frequency of Communication with Friends and Family: Not on file  . Frequency of Social Gatherings with Friends and Family: Not on file  . Attends Religious Services: Not on file  . Active Member of Clubs or Organizations: Not on file  . Attends BankerClub or Organization Meetings: Not on file  . Marital Status: Not on file     Family History: The patient's family history includes Breast cancer in an other family member; CAD (age of onset: 6052) in her brother; COPD in her maternal grandmother; Colon cancer in her maternal grandfather; Colon polyps in her mother; Diabetes in her maternal grandmother; Heart failure in her maternal grandmother; Thyroid disease in her maternal grandmother; Vascular Disease in her maternal grandmother. There is no history of Esophageal cancer, Rectal cancer, or Stomach cancer.  ROS:   Please see the history of present illness.     All other systems reviewed and are negative.  EKGs/Labs/Other Studies Reviewed:    The following studies were reviewed today: Cardiac catheterizations performed on 4/15 (before bypass) and fourth/27 (after bypass surgery) Echocardiograms performed on 4/15 and 4/25  Echo 06/12/2020 1. Left ventricular ejection fraction, by estimation, is 55 to 60%. The  left ventricle has normal function. The left ventricle has no regional  wall motion abnormalities. Left ventricular diastolic parameters are  consistent with Grade II diastolic  dysfunction (pseudonormalization).  2. Right ventricular systolic function is normal. The right ventricular  size is normal.  3. The mitral valve is normal in structure. Mild mitral valve  regurgitation. No evidence of mitral stenosis.  4. The aortic valve is tricuspid. Aortic valve regurgitation is not  visualized. Mild aortic  valve sclerosis is present, with no evidence of  aortic valve stenosis.  5. The inferior vena cava is normal in size with greater than 50%  respiratory variability, suggesting right atrial pressure of 3 mmHg.   EKG:  EKG is ordered today.  It is unchanged from previous tracing and shows sinus rhythm with a QS pattern in leads V1 and V2, but no ST segment or T wave abnormalities.  QTc is normal at 423 ms.  Recent Labs: 02/20/2020: TSH 2.744 02/22/2020: Magnesium 2.3 02/26/2020: B Natriuretic Peptide 598.5 03/01/2020: BUN 11; Creatinine, Ser 0.97; Hemoglobin 12.7; Platelets 338; Potassium 4.7; Sodium 139 06/09/2020: ALT 19  Recent Lipid Panel    Component Value Date/Time   CHOL 142 06/09/2020 0852   TRIG 163.0 (H) 06/09/2020 0852   HDL 50.70 06/09/2020 0852   CHOLHDL 3 06/09/2020 0852   VLDL 32.6 06/09/2020 0852   LDLCALC 59 06/09/2020 0852   LDLDIRECT 120.0 06/17/2019 0738    Physical Exam:    VS:  BP (!) 141/71   Pulse 76   Ht 5\' 4"  (1.626 m)   Wt 135 lb 12.8 oz (61.6 kg)   SpO2 100%   BMI 23.31 kg/m     Wt Readings from Last 3 Encounters:  07/28/20 135 lb 12.8 oz (61.6 kg)  07/28/20 136 lb (61.7 kg)  07/26/20 134 lb 7 oz (61 kg)      General: Alert, oriented x3, no distress, appears well.  Lean Head: no evidence of trauma, PERRL, EOMI, no exophtalmos or lid lag, no myxedema, no xanthelasma; normal ears, nose and oropharynx Neck: normal jugular venous pulsations and no hepatojugular reflux; brisk carotid pulses without delay and no carotid bruits Chest: clear to auscultation, no signs of consolidation by percussion or palpation, normal fremitus, symmetrical and full respiratory excursions Cardiovascular: normal position and quality of the apical impulse, regular rhythm, normal first and second heart sounds, 1-2/6 systolic ejection murmur in the aortic focus early peaking, no diastolic murmurs, rubs or gallops Abdomen: no tenderness or distention, no masses by palpation, no  abnormal pulsatility or arterial bruits, normal bowel sounds, no hepatosplenomegaly Extremities: no clubbing, cyanosis or edema; 2+ radial, ulnar and brachial pulses bilaterally; 2+ right femoral, posterior tibial and dorsalis pedis pulses; 2+ left femoral, posterior tibial and dorsalis pedis pulses; no subclavian or femoral bruits Neurological: grossly nonfocal Psych: Normal mood and affect   ASSESSMENT:    1. Coronary artery disease involving coronary bypass graft of native heart with other forms of angina pectoris (HCC)   2. Hypercholesterolemia   3. Controlled type 1 diabetes mellitus with complication, with long-term current use of insulin (HCC)    PLAN:    In order of problems listed above:  1. CAD s/p CABG: She is probably describing angina pectoris, although the pattern is not clearly exertional.  There are no changes on her ECG at rest.  It is possible that the discomfort is now more obvious since she is leading a more active lifestyle.  She has always preferred a conservative approach to management of her current coronary problems, following the early failure of her venous grafts.  She did not tolerate beta-blockers due to low blood pressure, although her blood pressure is a little higher than it was in the past now.  We will start long-acting nitrates.  Continue aspirin and lipid-lowering therapy.  There are multiple areas of ischemia that might be responsible for her symptoms.  The first diagonal artery is probably to small for percutaneous revascularization.  The oblique marginal branch has an ostial stenosis that could be stented, but also had some distal disease that is unlikely to be helped by PCI.  Finally, the right coronary artery has a high-grade stenosis in its midportion.  If nitrates do not  work we could try adding ranolazine or a really low dose of beta-blocker.  We will refer her for a Lexiscan Myoview to see if we can identify the major area of ischemia that could be treated  with PCI, but we will try medications before referral for repeat cath/PCI. 2. HLP: Statin intolerant but with excellent improvement in lipid profile on PCSK9 inhibitor. 3. DM: Remarkable improvement in glycemic control, seen by Dr. Elvera Lennox in the endocrinology clinic   Medication Adjustments/Labs and Tests Ordered: Current medicines are reviewed at length with the patient today.  Concerns regarding medicines are outlined above.  Orders Placed This Encounter  Procedures  . MYOCARDIAL PERFUSION IMAGING  . EKG 12-Lead   Meds ordered this encounter  Medications  . isosorbide mononitrate (IMDUR) 30 MG 24 hr tablet    Sig: Take 1 tablet (30 mg total) by mouth daily.    Dispense:  90 tablet    Refill:  3    Patient Instructions  Medication Instructions:  BEGIN TAKING ISOSORBIDE MONONITRATE 30 MG ONCE A DAY  *If you need a refill on your cardiac medications before your next appointment, please call your pharmacy*   Lab Work: NONE  If you have labs (blood work) drawn today and your tests are completely normal, you will receive your results only by: Marland Kitchen MyChart Message (if you have MyChart) OR . A paper copy in the mail If you have any lab test that is abnormal or we need to change your treatment, we will call you to review the results.   Testing/Procedures: Your physician has requested that you have a lexiscan myoview. For further information please visit [email or web address] Please follow instruction sheet, as given.  PLEASE HOLD ALL DIABETIC MEDICATIONS UNTIL AFTER YOUR TEST WHEN YOU HAVE A MEAL  THIS TEST WILL BE PERFORMED AT [address]   Follow-Up: At [facility], you and your health needs are our priority.  As part of our continuing mission to provide you with exceptional heart care, we have created designated Provider Care Teams.  These Care Teams include your primary Cardiologist (physician) and Advanced Practice Providers (APPs -  Physician Assistants and Nurse  Practitioners) who all work together to provide you with the care you need, when you need it.  We recommend signing up for the patient portal called "MyChart".  Sign up information is provided on this After Visit Summary.  MyChart is used to connect with patients for Virtual Visits (Telemedicine).  Patients are able to view lab/test results, encounter notes, upcoming appointments, etc.  Non-urgent messages can be sent to your provider as well.   To learn more about what you can do with MyChart, go to [email or web address].    Your next appointment:   6-8 week(s)  The format for your next appointment:   In Person  Provider:   Thurmon Fair, MD   Other Instructions NONE     Signed, Thurmon Fair, MD  07/28/2020 12:24 PM    [facility]

## 2020-07-31 ENCOUNTER — Telehealth (HOSPITAL_COMMUNITY): Payer: Self-pay

## 2020-07-31 NOTE — Telephone Encounter (Signed)
Spoke with the patient, she stated that she understood and would be here for her test. Asked to call back with any questions. S.Ronda Kazmi EMTP 

## 2020-08-01 ENCOUNTER — Encounter: Payer: Self-pay | Admitting: Family Medicine

## 2020-08-03 ENCOUNTER — Ambulatory Visit (HOSPITAL_COMMUNITY): Payer: BC Managed Care – PPO | Attending: Internal Medicine

## 2020-08-03 ENCOUNTER — Other Ambulatory Visit: Payer: Self-pay

## 2020-08-03 DIAGNOSIS — I25708 Atherosclerosis of coronary artery bypass graft(s), unspecified, with other forms of angina pectoris: Secondary | ICD-10-CM | POA: Diagnosis not present

## 2020-08-03 LAB — MYOCARDIAL PERFUSION IMAGING
LV dias vol: 47 mL (ref 46–106)
LV sys vol: 14 mL
Peak HR: 86 {beats}/min
Rest HR: 75 {beats}/min
SDS: 2
SRS: 9
SSS: 11
TID: 1.01

## 2020-08-03 MED ORDER — TECHNETIUM TC 99M TETROFOSMIN IV KIT
10.9000 | PACK | Freq: Once | INTRAVENOUS | Status: AC | PRN
  Administered 2020-08-03: 10.9 via INTRAVENOUS
  Filled 2020-08-03: qty 11

## 2020-08-03 MED ORDER — REGADENOSON 0.4 MG/5ML IV SOLN
0.4000 mg | Freq: Once | INTRAVENOUS | Status: AC
  Administered 2020-08-03: 0.4 mg via INTRAVENOUS

## 2020-08-03 MED ORDER — TECHNETIUM TC 99M TETROFOSMIN IV KIT
30.9000 | PACK | Freq: Once | INTRAVENOUS | Status: AC | PRN
  Administered 2020-08-03: 30.9 via INTRAVENOUS
  Filled 2020-08-03: qty 31

## 2020-08-09 ENCOUNTER — Other Ambulatory Visit (HOSPITAL_COMMUNITY): Payer: BC Managed Care – PPO

## 2020-08-26 ENCOUNTER — Other Ambulatory Visit: Payer: Self-pay | Admitting: Internal Medicine

## 2020-09-11 ENCOUNTER — Encounter: Payer: Self-pay | Admitting: *Deleted

## 2020-09-11 ENCOUNTER — Encounter: Payer: Self-pay | Admitting: Cardiovascular Disease

## 2020-09-21 ENCOUNTER — Ambulatory Visit: Payer: BC Managed Care – PPO | Admitting: Cardiovascular Disease

## 2020-09-21 ENCOUNTER — Telehealth: Payer: Self-pay | Admitting: Internal Medicine

## 2020-09-21 NOTE — Telephone Encounter (Signed)
Patient called re: [place] [employer] told patient to call our office to let our office know that they will be calling and faxing request for new RX for [employer]

## 2020-09-27 NOTE — Telephone Encounter (Signed)
Patient called again re: [place] [employer] stated to patient that they have faxed forms for Dexcom  Glucose Monitoring Refills/Supplies with no response from our office.

## 2020-09-27 NOTE — Telephone Encounter (Signed)
Outbound call to patient to advise the [place] [employer] paperwork and requested clinical has been faxed. Patient verbalized understanding.

## 2020-10-05 ENCOUNTER — Telehealth: Payer: Self-pay | Admitting: Internal Medicine

## 2020-10-05 NOTE — Telephone Encounter (Signed)
FAX [phone] [place] [employer] per pt they still say they have not received anything.  Pt states they gave her this alternate fax number. Please fax paperwork there.

## 2020-10-05 NOTE — Telephone Encounter (Signed)
Note created in error.

## 2020-10-06 NOTE — Telephone Encounter (Signed)
Called and left a message for patient advising paperwork was faxed to alternate fax number provided.

## 2020-10-11 DIAGNOSIS — E1065 Type 1 diabetes mellitus with hyperglycemia: Secondary | ICD-10-CM | POA: Diagnosis not present

## 2020-10-17 ENCOUNTER — Encounter: Payer: Self-pay | Admitting: Family Medicine

## 2020-10-21 ENCOUNTER — Other Ambulatory Visit: Payer: Self-pay | Admitting: Internal Medicine

## 2020-10-31 DIAGNOSIS — H4312 Vitreous hemorrhage, left eye: Secondary | ICD-10-CM | POA: Diagnosis not present

## 2020-11-09 DIAGNOSIS — M65331 Trigger finger, right middle finger: Secondary | ICD-10-CM | POA: Diagnosis not present

## 2020-11-14 DIAGNOSIS — H35372 Puckering of macula, left eye: Secondary | ICD-10-CM | POA: Diagnosis not present

## 2020-11-14 DIAGNOSIS — E103592 Type 1 diabetes mellitus with proliferative diabetic retinopathy without macular edema, left eye: Secondary | ICD-10-CM | POA: Diagnosis not present

## 2020-11-14 DIAGNOSIS — E103311 Type 1 diabetes mellitus with moderate nonproliferative diabetic retinopathy with macular edema, right eye: Secondary | ICD-10-CM | POA: Diagnosis not present

## 2020-11-14 DIAGNOSIS — H35033 Hypertensive retinopathy, bilateral: Secondary | ICD-10-CM | POA: Diagnosis not present

## 2020-11-15 ENCOUNTER — Encounter: Payer: Self-pay | Admitting: Family Medicine

## 2020-11-20 ENCOUNTER — Other Ambulatory Visit: Payer: Self-pay | Admitting: Internal Medicine

## 2020-11-23 ENCOUNTER — Encounter: Payer: Self-pay | Admitting: Cardiovascular Disease

## 2020-11-23 ENCOUNTER — Other Ambulatory Visit: Payer: Self-pay

## 2020-11-23 ENCOUNTER — Ambulatory Visit (INDEPENDENT_AMBULATORY_CARE_PROVIDER_SITE_OTHER): Payer: BC Managed Care – PPO | Admitting: Cardiovascular Disease

## 2020-11-23 VITALS — BP 134/76 | HR 78 | Ht 64.0 in | Wt 132.0 lb

## 2020-11-23 DIAGNOSIS — E108 Type 1 diabetes mellitus with unspecified complications: Secondary | ICD-10-CM | POA: Diagnosis not present

## 2020-11-23 DIAGNOSIS — E1069 Type 1 diabetes mellitus with other specified complication: Secondary | ICD-10-CM

## 2020-11-23 DIAGNOSIS — I25810 Atherosclerosis of coronary artery bypass graft(s) without angina pectoris: Secondary | ICD-10-CM

## 2020-11-23 DIAGNOSIS — E785 Hyperlipidemia, unspecified: Secondary | ICD-10-CM | POA: Diagnosis not present

## 2020-11-23 NOTE — Patient Instructions (Signed)

## 2020-11-25 NOTE — Progress Notes (Signed)
Cardiology Office Note:    Date:  11/25/2020   ID:  Felicia [name], DOB 22-Jul-1961, MRN [id number]  PCP:  Eustaquio BoydenGutierrez, Javier, MD  [facility] Cardiologist:  Thurmon FairMihai Kaylanni Ezelle, MD  [facility] Electrophysiologist:  None   Referring MD: Eustaquio BoydenGutierrez, Javier, MD   Chief Complaint  Patient presents with  . Follow-up    2 months.    History of Present Illness:    Felicia DallyDebra L Si is a 60 y.o. female with a hx of CAD s/p CABG April 2021, with early vein graft occlusion but patent LIMA to LAD, type 1 diabetes mellitus on insulin, hypercholesterolemia intolerant to statins on PCSK9 inhibitor.  Early postoperative course was complicated by an episode of syncope with fall and R orbital fracture and left wrist fracture and coronary/graft angiography showed that she had lost patency of all the SVGs.  The LIMA to LAD bypass was patent.  No intervention was performed.  Echocardiogram showed that left ventricular wall motion was abnormal, significant for moderate hypokinesis of the apical segments of the anterior and anterolateral walls, but overall LVEF was normal at 55 to 60%.  Diagnostic Dominance: Right     She is remaining very physically active and feels greatly improved.  She walks on her treadmill daily for 40 minutes at an incline of 4% and a speed of 3-4 mph.  The other day she shoveled wet snow and felt okay without shortness of breath or angina.  Repeat echocardiogram in August showed a residual apical lateral wall motion abnormality, but normal overall EF.  Her nuclear stress that showed similar findings with an apical scar, no evidence of reversible ischemia and normal left ventricular systolic function.  Risk factors are very well controlled. Her most recent LDL cholesterol was 59 on treatment with Repatha.  She reports that glucose control has been good, and this is reflected in her most recent hemoglobin A1c which is now much better at just 7.0%, down from 8.9% in April.  Past Medical  History:  Diagnosis Date  . Allergy   . Anemia    past history long ago  . Borderline HTN (hypertension)   . CAD (coronary artery disease)    a. 02/2020 Cath: LM nl, LAD large, 90p, 3060m, D1 90p, LCX nl, OM 60-70p, RCA dominant w/ dampening upon engagement of ostium. 70p/m, RPDA 50. EF 55-65%.  . Disorder of kidney    has abd kidney (right)  . GERD (gastroesophageal reflux disease)   . History of diabetic gastroparesis   . History of echocardiogram    a. 01/2013 Echo: EF 55-65%, no rwma. Mild MR. Nl RV fxn.  Marland Kitchen. HLD (hyperlipidemia)   . Hx: UTI (urinary tract infection)   . IBS (irritable bowel syndrome)   . Nephrolithiasis 2005  . Osteopenia 2012   per pt  . Systolic murmur 01/2013   mild mitral insuff  . Type 1 diabetes mellitus with diabetic retinopathy without macular edema (HCC)    dx at 60 y/o, background retinopathy    Past Surgical History:  Procedure Laterality Date  . CESAREAN SECTION  1985  . COLONOSCOPY  07/2016   WNL (Pyrtle)  . CORONARY ARTERY BYPASS GRAFT N/A 02/21/2020   Procedure: CORONARY ARTERY BYPASS GRAFTING (CABG) times five using left internal mammary and right leg saphenous vein;  Surgeon: Corliss SkainsLightfoot, Harrell O, MD;  Location: [facility] OR;  Service: Open Heart Surgery;  Laterality: N/A;  . ESOPHAGOGASTRODUODENOSCOPY  07/2016   LA Grade C reflux esophagitis, concern for stasis/dysmotility (Pyrtle)  .  LAPAROSCOPIC APPENDECTOMY N/A 09/01/2015   Procedure: APPENDECTOMY LAPAROSCOPIC;  Surgeon: Jimmye Norman, MD;  Location: [facility] OR;  Service: General;  Laterality: N/A;  . LEFT HEART CATH AND CORONARY ANGIOGRAPHY N/A 02/17/2020   Procedure: LEFT HEART CATH AND CORONARY ANGIOGRAPHY;  Surgeon: Antonieta Iba, MD;  Location: [facility] INVASIVE CV LAB;  Service: Cardiovascular;  Laterality: N/A;  . LEFT HEART CATH AND CORS/GRAFTS ANGIOGRAPHY N/A 02/29/2020   Procedure: LEFT HEART CATH AND CORS/GRAFTS ANGIOGRAPHY;  Surgeon: [name], MD;  Location: [facility] INVASIVE CV LAB;   Service: Cardiovascular;  Laterality: N/A;  . ORIF FEMUR FRACTURE Left 1996   Corrected by patient in history 2016  . PARTIAL HYSTERECTOMY  2000   paps by [facility] [place]  . TEE WITHOUT CARDIOVERSION N/A 02/21/2020   Procedure: TRANSESOPHAGEAL ECHOCARDIOGRAM (TEE);  Surgeon: Corliss Skains, MD;  Location: [facility] OR;  Service: Open Heart Surgery;  Laterality: N/A;  . TONSILLECTOMY AND ADENOIDECTOMY  1975  . TRIGGER FINGER RELEASE Left 12/2010   thumb    Current Medications: Current Meds  Medication Sig  . aspirin 81 MG EC tablet Take 1 tablet (81 mg total) by mouth daily.  Marland Kitchen esomeprazole (NEXIUM) 40 MG capsule Take 1 capsule (40 mg total) by mouth daily. Patient needs office visit for further refills  . Evolocumab (REPATHA SURECLICK) 140 MG/ML SOAJ Inject 140 mg into the skin every 14 (fourteen) days.  Marland Kitchen glucagon (GLUCAGON EMERGENCY) 1 MG injection Inject 1 mg into the muscle once as needed (in case of severe hypoglycemia).  . insulin regular (NOVOLIN R) 100 units/mL injection INJECT 6 UNITS TOTAL INTO THE SKIN 3 (THREE) TIMES DAILY BEFORE MEALS.  Marland Kitchen Insulin Syringe-Needle U-100 30G X 5/16" 0.3 ML MISC Use to inject insulin 6 times a day.  Marland Kitchen LANTUS SOLOSTAR 100 UNIT/ML Solostar Pen INJECT 12 UNITS INTO THE SKIN DAILY.  . Multiple Vitamin (MULTIVITAMIN) tablet Take 1 tablet by mouth daily.  Marland Kitchen NOVOFINE PEN NEEDLE 32G X 6 MM MISC USE TO INJECT INSULIN 4 TIMES A DAY  . sulfamethoxazole-trimethoprim (BACTRIM DS) 800-160 MG tablet Take 1 tablet by mouth 2 (two) times daily.     Allergies:   Protonix [pantoprazole], Toujeo solostar [insulin glargine], Lactose intolerance (gi), Magnesium-containing compounds, and Adhesive [tape]   Social History   Socioeconomic History  . Marital status: Married    Spouse name: Not on file  . Number of children: 1  . Years of education: 50  . Highest education level: Bachelor's degree (e.g., BA, AB, BS)  Occupational History  . Occupation: HR  Chemical engineer: OTHER    Comment: Scientist, research (medical)  Tobacco Use  . Smoking status: Never Smoker  . Smokeless tobacco: Never Used  Vaping Use  . Vaping Use: Never used  Substance and Sexual Activity  . Alcohol use: No    Alcohol/week: 0.0 standard drinks  . Drug use: No  . Sexual activity: Yes    Birth control/protection: None    Comment: Married  Other Topics Concern  . 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  Has one daughter.  Education: college.      1 dog   Social Determinants of Corporate investment banker Strain: Not on file  Food Insecurity: Not on file  Transportation Needs: Not on file  Physical Activity: Not on file  Stress: Not on  file  Social Connections: Not on file     Family History: The patient's family history includes Breast cancer in an other family member; CAD (age of onset: 64) in her brother; COPD in her maternal grandmother; Colon cancer in her maternal grandfather; Colon polyps in her mother; Diabetes in her maternal grandmother; Heart failure in her maternal grandmother; Thyroid disease in her maternal grandmother; Vascular Disease in her maternal grandmother. There is no history of Esophageal cancer, Rectal cancer, or Stomach cancer.  ROS:   Please see the history of present illness.     All other systems reviewed and are negative.  EKGs/Labs/Other Studies Reviewed:    The following studies were reviewed today: Cardiac catheterizations performed on 4/15 (before bypass) and 4/27 (after bypass surgery) Echocardiograms performed on 4/15 and 4/25  Nuclear stress test 08/03/2020    The left ventricular ejection fraction is hyperdynamic (>65%).  Nuclear stress EF: 70%.  There was no ST segment deviation noted during stress.  Defect 1: There is a small defect of severe severity present in the apical anterior and apex location.   There is a  small irreversible defect of severe severity present in the apical anterior and apex location consistent with prior infarct in the distal LAD territory, there is no peri-infarct ischemia, overall LVEF is normal.    Echo 06/12/2020 1. Left ventricular ejection fraction, by estimation, is 55 to 60%. The  left ventricle has normal function. The left ventricle has no regional  wall motion abnormalities. Left ventricular diastolic parameters are  consistent with Grade II diastolic  dysfunction (pseudonormalization).  2. Right ventricular systolic function is normal. The right ventricular  size is normal.  3. The mitral valve is normal in structure. Mild mitral valve  regurgitation. No evidence of mitral stenosis.  4. The aortic valve is tricuspid. Aortic valve regurgitation is not  visualized. Mild aortic valve sclerosis is present, with no evidence of  aortic valve stenosis.  5. The inferior vena cava is normal in size with greater than 50%  respiratory variability, suggesting right atrial pressure of 3 mmHg.   EKG:  EKG is ordered today.  It is unchanged from previous tracing and shows sinus rhythm with a QS pattern in leads V1 and V2, but no ST segment or T wave abnormalities.  QTc is normal at 423 ms.  Recent Labs: 02/20/2020: TSH 2.744 02/22/2020: Magnesium 2.3 02/26/2020: B Natriuretic Peptide 598.5 03/01/2020: BUN 11; Creatinine, Ser 0.97; Hemoglobin 12.7; Platelets 338; Potassium 4.7; Sodium 139 06/09/2020: ALT 19  Recent Lipid Panel    Component Value Date/Time   CHOL 142 06/09/2020 0852   TRIG 163.0 (H) 06/09/2020 0852   HDL 50.70 06/09/2020 0852   CHOLHDL 3 06/09/2020 0852   VLDL 32.6 06/09/2020 0852   LDLCALC 59 06/09/2020 0852   LDLDIRECT 120.0 06/17/2019 0738    Physical Exam:    VS:  BP 134/76 (BP Location: Left Arm, Patient Position: Sitting, Cuff Size: Normal)   Pulse 78   Ht 5\' 4"  (1.626 m)   Wt 132 lb (59.9 kg)   BMI 22.66 kg/m     Wt Readings from Last 3  Encounters:  11/23/20 132 lb (59.9 kg)  08/03/20 135 lb (61.2 kg)  07/28/20 135 lb 12.8 oz (61.6 kg)     General: Alert, oriented x3, no distress, appears well Head: no evidence of trauma, PERRL, EOMI, no exophtalmos or lid lag, no myxedema, no xanthelasma; normal ears, nose and oropharynx Neck: normal jugular venous pulsations and  no hepatojugular reflux; brisk carotid pulses without delay and no carotid bruits Chest: clear to auscultation, no signs of consolidation by percussion or palpation, normal fremitus, symmetrical and full respiratory excursions Cardiovascular: normal position and quality of the apical impulse, regular rhythm, normal first and second heart sounds, 1-2/6 early peaking aortic ejection murmur, no diastolic murmurs, rubs or gallops Abdomen: no tenderness or distention, no masses by palpation, no abnormal pulsatility or arterial bruits, normal bowel sounds, no hepatosplenomegaly Extremities: no clubbing, cyanosis or edema; 2+ radial, ulnar and brachial pulses bilaterally; 2+ right femoral, posterior tibial and dorsalis pedis pulses; 2+ left femoral, posterior tibial and dorsalis pedis pulses; no subclavian or femoral bruits Neurological: grossly nonfocal Psych: Normal mood and affect   ASSESSMENT:    1. Coronary artery disease involving coronary bypass graft of native heart without angina pectoris   2. Hyperlipidemia due to type 1 diabetes mellitus (HCC)   3. Controlled type 1 diabetes mellitus with complication, with long-term current use of insulin (HCC)    PLAN:    In order of problems listed above:  1. CAD s/p CABG: She is doing remarkably well with medical therapy, despite the complexity of her coronary problems.  She is exercising daily and has no complaints of angina pectoris, despite the fact that she is no longer taking any antianginal medications.  Her risk factors are very well addressed.  She did not tolerate beta-blockers due to low blood pressure.  On  low-dose aspirin. 2. HLP: Statin intolerant but with excellent improvement in lipid profile on PCSK9 inhibitor.  Most recent LDL cholesterol 59.  Triglycerides borderline at 163 3. DM: She has a history of type 1 diabetes mellitus for the last 45 years.  She is very insulin sensitive.  Remarkable improvement in glycemic control recently, seen by Dr. Elvera Lennox in the endocrinology clinic.  Her most recent hemoglobin A1c was 7% but she is also noticed increasing frequency of episodes of low glucose for which she has to emergently administer glucose supplements.   Medication Adjustments/Labs and Tests Ordered: Current medicines are reviewed at length with the patient today.  Concerns regarding medicines are outlined above.  No orders of the defined types were placed in this encounter.  No orders of the defined types were placed in this encounter.   Patient Instructions  Medication Instructions:  No changes *If you need a refill on your cardiac medications before your next appointment, please call your pharmacy*   Lab Work: None ordered If you have labs (blood work) drawn today and your tests are completely normal, you will receive your results only by: Marland Kitchen MyChart Message (if you have MyChart) OR . A paper copy in the mail If you have any lab test that is abnormal or we need to change your treatment, we will call you to review the results.   Testing/Procedures: None ordered   Follow-Up: At [facility], you and your health needs are our priority.  As part of our continuing mission to provide you with exceptional heart care, we have created designated Provider Care Teams.  These Care Teams include your primary Cardiologist (physician) and Advanced Practice Providers (APPs -  Physician Assistants and Nurse Practitioners) who all work together to provide you with the care you need, when you need it.  We recommend signing up for the patient portal called "MyChart".  Sign up information is  provided on this After Visit Summary.  MyChart is used to connect with patients for Virtual Visits (Telemedicine).  Patients are  able to view lab/test results, encounter notes, upcoming appointments, etc.  Non-urgent messages can be sent to your provider as well.   To learn more about what you can do with MyChart, go to [email or web address].    Your next appointment:   12 month(s)  The format for your next appointment:   In Person  Provider:   You may see Thurmon FairMihai Sultan Pargas, MD or one of the following Advanced Practice Providers on your designated Care Team:    Azalee CourseHao Meng, PA-C  Micah FlesherAngela Duke, [place] JerseyPA-C or   Judy PimpleKrista Kroeger, PA-C       Signed, Thurmon FairMihai Raechal Raben, MD  11/25/2020 4:07 PM    [facility]

## 2020-11-28 DIAGNOSIS — Z1231 Encounter for screening mammogram for malignant neoplasm of breast: Secondary | ICD-10-CM | POA: Diagnosis not present

## 2020-11-28 DIAGNOSIS — Z01419 Encounter for gynecological examination (general) (routine) without abnormal findings: Secondary | ICD-10-CM | POA: Diagnosis not present

## 2020-11-28 DIAGNOSIS — Z6822 Body mass index (BMI) 22.0-22.9, adult: Secondary | ICD-10-CM | POA: Diagnosis not present

## 2020-11-28 DIAGNOSIS — M810 Age-related osteoporosis without current pathological fracture: Secondary | ICD-10-CM | POA: Insufficient documentation

## 2020-11-28 DIAGNOSIS — M8580 Other specified disorders of bone density and structure, unspecified site: Secondary | ICD-10-CM | POA: Insufficient documentation

## 2020-11-29 ENCOUNTER — Ambulatory Visit: Payer: Self-pay | Admitting: Internal Medicine

## 2020-11-30 ENCOUNTER — Encounter: Payer: Self-pay | Admitting: Internal Medicine

## 2020-11-30 ENCOUNTER — Other Ambulatory Visit: Payer: Self-pay

## 2020-11-30 ENCOUNTER — Ambulatory Visit (INDEPENDENT_AMBULATORY_CARE_PROVIDER_SITE_OTHER): Payer: BC Managed Care – PPO | Admitting: Internal Medicine

## 2020-11-30 VITALS — BP 120/82 | HR 77 | Ht 64.0 in | Wt 132.4 lb

## 2020-11-30 DIAGNOSIS — E103293 Type 1 diabetes mellitus with mild nonproliferative diabetic retinopathy without macular edema, bilateral: Secondary | ICD-10-CM | POA: Diagnosis not present

## 2020-11-30 DIAGNOSIS — E1069 Type 1 diabetes mellitus with other specified complication: Secondary | ICD-10-CM | POA: Diagnosis not present

## 2020-11-30 DIAGNOSIS — E785 Hyperlipidemia, unspecified: Secondary | ICD-10-CM | POA: Diagnosis not present

## 2020-11-30 DIAGNOSIS — M65331 Trigger finger, right middle finger: Secondary | ICD-10-CM | POA: Diagnosis not present

## 2020-11-30 LAB — POCT GLYCOSYLATED HEMOGLOBIN (HGB A1C): Hemoglobin A1C: 7.5 % — AB (ref 4.0–5.6)

## 2020-11-30 NOTE — Progress Notes (Signed)
Patient ID: Felicia [name], female   DOB: June 30, 1961, 60 y.o.   MRN: [id number]  This visit occurred during the SARS-CoV-2 public health emergency.  Safety protocols were in place, including screening questions prior to the visit, additional usage of staff PPE, and extensive cleaning of exam room while observing appropriate contact time as indicated for disinfecting solutions.   HPI: Felicia [name] is a 60 y.o.-year-old female, presenting for follow-up for DM1, dx in 60 (age 62), uncontrolled, with complications (CAD - s/p 5v CABG, mild retinopathy, gastroparesis). Last visit 4 months ago. She is here with her husband.  In 01/2020 >> started exercise >> started to have CP >> saw PCP - BP very high >> sent to ED >> AMI r/o >> sent to see cardiology in OP >> had cath >> 9 blockages >> CABG 02/21/2020 >> went home >> she had a syncopal episode in 02/25/2020 (fell and fractured orbit, wrist) -2/2 hypotension >> another catheterization >> 4/5 bypasses failed.  As of now, she is followed conservatively with plan for PCI if chest pain recurs.  She finished cardiac rehab 2 weeks prior to our last visit.  She still had chest pain at last visit -continues to follow with cardiology.  Of note, her sugars improved significantly after her CABG surgery.  She had outpatient carpal tunnel surgery this morning.  Right hand is bandaged.  Reviewed HbA1c levels: Lab Results  Component Value Date   HGBA1C 7.0 (A) 07/28/2020   HGBA1C 8.9 (H) 02/17/2020   HGBA1C 8.5 (H) 09/23/2019   She was previously on insulin pump for 5 years (~2010) but she did not like it.  Sugars were not better on this.  She is not interested in getting back on the pump.    She is on: - Tresiba 12 units in am >> ... >> Lantus 8 in a.m. and 6 units at bedtime >> 6 units twice a day >> 4 units in am and 6 units in pm  - R insulin: - insulin to carb ratio (ICR)   B'fast: 10:1 (2-4 units)  Lunch: 10:1 (1-2 units)  Dinner: 20:1 (no more  than 1-2 units) - target 150 - insulin sensitivity factor (ISF) 60: 150-200: + 1 unit 201-260: + 2 units 261-320: + 3 units 321-380: + 4 units >380: + 5 units Do not correct bedtime sugars <300, and only inject 1 unit then.  For the snack at night, do not take more than 1-2 units of regular insulin!  We tried Metformin ER 1000 mg with dinner but stopped due to lack of effect. Tried [place] >> she did not feel that this worked as well as Lantus. Tried Toujeo >> allergy: CP, SOB.  Meter: ReliOn  However, she now checks her blood sugars more than 4 times a day with her Dexcom CGM.  Freestyle libre CGM parameters: - Average: 171 +/-63 - % active CGM time: 99.1% of the time - Glucose variability 36.9% (target < or = to 36%) - GMI: 7.4% - time in range:  - very low (<54): <1% - low (54-69): 1% - normal range (70-180): 56% - high sugars (181-250): 31% - very high sugars (>250): 11%    Previously: - am: 51, 59-157, 191, 229 >> 85-207, 227, 267 - ate during the night - 2h after b'fast: n/c >> 87-203 >> n/c - lunch: 146, 236-342 >> 81-192, 264 >> 44, 45, 75-171, 331  - 2h after lunch: n/c >> 62 >> n/c - dinner: 196-303, 436 >>  71-174, 260, 265 >> 49-159, 239 - 2h after dinner: n/c >> 83 >> n/c - bedtime: 83-203 >> 65-208, 253, 266 >> 67-119, 139 - night: 55, 150 >> n/c  Lowest sugar was 38 >>... 45 >> 55; + hypoglycemia awareness in the 70s.  No recent hyper or hypoglycemia admissions.  She has a glucagon kit at home. Highest sugar was 500s (bagel) >> 436 >> 266 >> 331 >> 254.   Pt's meals are: - Breakfast: protein bar + fruit >> granola bar (25 g carbs) - 10 am snack  - Lunch: apple + cheese, carrots - Snack bar: 15g carbs - Glucerna - Dinner: meat + 1/2 backed potato + veggies/salad - Snacks: popcorn, salty snacks: Potato chips  -No CKD, last BUN/creatinine:  Lab Results  Component Value Date   BUN 11 03/01/2020   CREATININE 0.97 03/01/2020  Previously on quinapril,  now off. + HL; last set of lipids: Lab Results  Component Value Date   CHOL 142 06/09/2020   HDL 50.70 06/09/2020   LDLCALC 59 06/09/2020   LDLDIRECT 120.0 06/17/2019   TRIG 163.0 (H) 06/09/2020   CHOLHDL 3 06/09/2020  She tried pravastatin, lovastatin, Zetia, even adding co-Q10 but she still had muscle cramps >>  now on Repatha. She also uses uses 2 creams: Theraworx + Muscle calm.   - last eye exam was on 09/17/2020: + DR - no numbness and tingling in her feet.  On ASA 81.  Latest TSH is normal: Lab Results  Component Value Date   TSH 2.744 02/20/2020   She also has a history of HTN.  ROS: Constitutional: no weight gain/no weight loss, no fatigue, no subjective hyperthermia, no subjective hypothermia Eyes: no blurry vision, no xerophthalmia ENT: no sore throat, no nodules palpated in neck, no dysphagia, no odynophagia, no hoarseness Cardiovascular: no CP/no SOB/no palpitations/no leg swelling Respiratory: no cough/no SOB/no wheezing Gastrointestinal: no N/no V/no D/no C/no acid reflux Musculoskeletal: + muscle aches (hand)/no joint aches Skin: no rashes, no hair loss Neurological: no tremors/no numbness/no tingling/no dizziness  I reviewed pt's medications, allergies, PMH, social hx, family hx, and changes were documented in the history of present illness. Otherwise, unchanged from my initial visit note.  Past Medical History:  Diagnosis Date  . Allergy   . Anemia    past history long ago  . Borderline HTN (hypertension)   . CAD (coronary artery disease)    a. 02/2020 Cath: LM nl, LAD large, 90p, 54m D1 90p, LCX nl, OM 60-70p, RCA dominant w/ dampening upon engagement of ostium. 70p/m, RPDA 50. EF 55-65%.  . Disorder of kidney    has abd kidney (right)  . GERD (gastroesophageal reflux disease)   . History of diabetic gastroparesis   . History of echocardiogram    a. 01/2013 Echo: EF 55-65%, no rwma. Mild MR. Nl RV fxn.  .Marland KitchenHLD (hyperlipidemia)   . Hx: UTI (urinary  tract infection)   . IBS (irritable bowel syndrome)   . Nephrolithiasis 2005  . Osteopenia 2012   per pt  . Systolic murmur 30/0938  mild mitral insuff  . Type 1 diabetes mellitus with diabetic retinopathy without macular edema (HCC)    dx at 60y/o, background retinopathy   Past Surgical History:  Procedure Laterality Date  . CESAREAN SECTION  1985  . COLONOSCOPY  07/2016   WNL (Pyrtle)  . CORONARY ARTERY BYPASS GRAFT N/A 02/21/2020   Procedure: CORONARY ARTERY BYPASS GRAFTING (CABG) times five using left internal mammary  and right leg saphenous vein;  Surgeon: Lajuana Matte, MD;  Location: [facility];  Service: Open Heart Surgery;  Laterality: N/A;  . ESOPHAGOGASTRODUODENOSCOPY  07/2016   LA Grade C reflux esophagitis, concern for stasis/dysmotility ([place])  . LAPAROSCOPIC APPENDECTOMY N/A 09/01/2015   Procedure: APPENDECTOMY LAPAROSCOPIC;  Surgeon: Judeth Horn, MD;  Location: [facility];  Service: General;  Laterality: N/A;  . LEFT HEART CATH AND CORONARY ANGIOGRAPHY N/A 02/17/2020   Procedure: LEFT HEART CATH AND CORONARY ANGIOGRAPHY;  Surgeon: Minna Merritts, MD;  Location: [facility];  Service: Cardiovascular;  Laterality: N/A;  . LEFT HEART CATH AND CORS/GRAFTS ANGIOGRAPHY N/A 02/29/2020   Procedure: LEFT HEART CATH AND CORS/GRAFTS ANGIOGRAPHY;  Surgeon: Burnell Blanks, MD;  Location: [facility];  Service: Cardiovascular;  Laterality: N/A;  . ORIF FEMUR FRACTURE Left 1996   Corrected by patient in history 2016  . PARTIAL HYSTERECTOMY  2000   paps by [facility] [place]  . TEE WITHOUT CARDIOVERSION N/A 02/21/2020   Procedure: TRANSESOPHAGEAL ECHOCARDIOGRAM (TEE);  Surgeon: Lajuana Matte, MD;  Location: [facility];  Service: Open Heart Surgery;  Laterality: N/A;  . TONSILLECTOMY AND ADENOIDECTOMY  1975  . TRIGGER FINGER RELEASE Left 12/2010   thumb   Social History   Socioeconomic History  . Marital status: Married    Spouse name: Not on file   . Number of children: 1  . Years of education: 31  . Highest education level: Bachelor's degree (e.g., BA, AB, BS)  Occupational History  . 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 Smoking status: Never Smoker  . Smokeless tobacco: Never Used  Vaping Use  . Vaping Use: Never used  Substance and Sexual Activity  . Alcohol use: No    Alcohol/week: 0.0 standard drinks  . Drug use: No  . Sexual activity: Yes    Birth control/protection: None    Comment: Married  Other Topics Concern  . 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  Has one daughter.  Education: college.      1 dog   Social Determinants of Radio broadcast assistant Strain: Not on file  Food Insecurity: Not on file  Transportation Needs: Not on file  Physical Activity: Not on file  Stress: Not on file  Social Connections: Not on file  Intimate Partner Violence: Not on file   Current Outpatient Medications on File Prior to Visit  Medication Sig Dispense Refill  . aspirin 81 MG EC tablet Take 1 tablet (81 mg total) by mouth daily.    Marland Kitchen esomeprazole (NEXIUM) 40 MG capsule Take 1 capsule (40 mg total) by mouth daily. Patient needs office visit for further refills 90 capsule 0  . Evolocumab (REPATHA SURECLICK) 270 MG/ML SOAJ Inject 140 mg into the skin every 14 (fourteen) days. 2 pen 11  . glucagon (GLUCAGON EMERGENCY) 1 MG injection Inject 1 mg into the muscle once as needed (in case of severe hypoglycemia). 1 each 12  . insulin regular (NOVOLIN R) 100 units/mL injection INJECT 6 UNITS TOTAL INTO THE SKIN 3 (THREE) TIMES DAILY BEFORE MEALS. 20 mL 5  . Insulin Syringe-Needle U-100 30G X 5/16" 0.3 ML MISC Use to inject insulin 6 times a day. 600 each 11  . LANTUS SOLOSTAR 100 UNIT/ML Solostar  Pen INJECT 12 UNITS INTO THE SKIN DAILY. 15 mL 0  . Multiple Vitamin (MULTIVITAMIN)  tablet Take 1 tablet by mouth daily.    Marland Kitchen NOVOFINE PEN NEEDLE 32G X 6 MM MISC USE TO INJECT INSULIN 4 TIMES A DAY 200 each 1  . sulfamethoxazole-trimethoprim (BACTRIM DS) 800-160 MG tablet Take 1 tablet by mouth 2 (two) times daily. 10 tablet 0   No current facility-administered medications on file prior to visit.   Allergies  Allergen Reactions  . Protonix [Pantoprazole] Swelling    Facial Swelling  . Toujeo Solostar [Insulin Glargine] Shortness Of Breath  . Lactose Intolerance (Gi)     GI  . Magnesium-Containing Compounds Swelling and Other (See Comments)    limbs  . Adhesive [Tape] Rash and Other (See Comments)    Band aids   Family History  Problem Relation Age of Onset  . Diabetes Maternal Grandmother   . Heart failure Maternal Grandmother   . COPD Maternal Grandmother        smoker  . Thyroid disease Maternal Grandmother   . Vascular Disease Maternal Grandmother   . Colon cancer Maternal Grandfather   . Colon polyps Mother   . Breast cancer Other   . CAD Brother 65       stent  . Esophageal cancer Neg Hx   . Rectal cancer Neg Hx   . Stomach cancer Neg Hx    PE: BP 120/82   Pulse 77   Ht 5' 4"  (1.626 m)   Wt 132 lb 6.4 oz (60.1 kg)   SpO2 99%   BMI 22.73 kg/m  Body mass index is 22.73 kg/m.  Wt Readings from Last 3 Encounters:  11/30/20 132 lb 6.4 oz (60.1 kg)  11/23/20 132 lb (59.9 kg)  08/03/20 135 lb (61.2 kg)   Constitutional: normal weight, in NAD Eyes: PERRLA, EOMI, no exophthalmos ENT: moist mucous membranes, no thyromegaly, no cervical lymphadenopathy Cardiovascular: RRR, No MRG Respiratory: CTA B Gastrointestinal: abdomen soft, NT, ND, BS+ Musculoskeletal: no deformities, strength intact in all 4 Skin: moist, warm, no rashes Neurological: no tremor with outstretched hands, DTR normal in all 4  ASSESSMENT: 1. DM1, uncontrolled, with complications - CAD - s/p 5v CABG - 02/21/2020 - Mild DR - Gastroparesis - This is the reason why she is on  regular insulin, and she is taking it 10 minutes  rather than 30 minutes before eating  She had low CBGs with Novolog!  2. HL  PLAN:  1.  Patient with difficult to control, longstanding type 1 diabetes, on a basal-bolus insulin regimen.  She tried an insulin pump in the past but did not like it.  She obtained a CGM (Dexcom) but she was reticent to start it due to body image (she also has scarring from insulin injections). -At last visit, sugars were much better after her CABG surgery and she was having low blood sugars before lunch.  I advised her to decrease the morning Lantus dose.  I also advised her to reconsider attaching the CGM.  She did so since last visit. CGM interpretation: -At today's visit, we reviewed her CGM downloads: It appears that 56.8% of values are in target range (goal >70%), while 42% are higher than 180 (goal <25%), and 1.8% are lower than 70 (goal <4%).  The calculated average blood sugar is 171.  The projected HbA1c for the next 3 months (GMI) is 7.4%. -Reviewing the CGM trends, the sugars are quite fluctuating, but she appears to have  less lows after she decreased the a.m. dose of Lantus from 6 units to 4 units.  She did this 2 weeks ago.  Sugars are very variable overnight, there is an increase after breakfast, however, afterwards sugars decrease and fluctuate mostly within the normal range but they appear to be lower after meals, including after dinner.  She is overcompensating for lows after dinner and sugars increase around 12 AM. -At this visit we discussed about possible reasons for the variability in the blood sugars.  She reports that blood sugars are better controlled when she injects insulin in her arms so we discussed about injecting only in the abdomen or in the upper buttocks or thighs -She also reports higher blood sugars after eating a muffin in the morning and I strongly advised her to stop eating this and switch to a better tolerated breakfast (given  examples) -She is also overcompensating for low blood sugars and we discussed about using less carbs to correct hypoglycemia -She reports dropping her blood sugars after exercise, from approximately 130 to the 70s (she is walking/running on the treadmill for 20 minutes).  I advised her that she may want to start with a slightly higher blood sugars, at 150 or above.  However, we discussed about not overcompensating if she gets to the 30s. -I also advised him to rely more on the Dexcom arrows and not to look at the blood sugar values as static parameters.  Discussed about how to interpret different CGM trends. -We did discuss that her gastroparesis is most likely influencing the absorption of her blood sugars so, in her case, her diabetes is more difficult to control and her blood sugars more unpredictable -Ultimately, since some of her blood sugars are low at night, I advised her to take less Lantus at bedtime, 5 units instead of 6 - I suggested to:  Patient Instructions  Please decrease: - Lantus 4 units in am and 5 units at bedtime  - R insulin: - insulin to carb ratio (ICR)   B'fast: 10:1  Lunch: 10:1   Dinner: 20:1  - target 150 - insulin sensitivity factor (ISF) 60: 150-200: + 1 unit 201-260: + 2 units 261-320: + 3 units 321-380: + 4 units >380: + 5 units Do not correct bedtime sugars <300, and only inject 1 unit then.   Stop muffins in am. Try half a sandwich with whole grain bread instead or oatmeal.  Try to correct low blood sugars with only 2-3 oz at a time.  Please return in 3-4 months with your sugar log.   - we checked her HbA1c: 7.5% (higher) - advised to check sugars at different times of the day - 4x a day, rotating check times - advised for yearly eye exams >> she is UTD - return to clinic in 3-4 months     2. HL -Reviewed latest lipid panel from 06/2020: LDL much improved, now at goal, triglycerides slightly high Lab Results  Component Value Date   CHOL 142  06/09/2020   HDL 50.70 06/09/2020   LDLCALC 59 06/09/2020   LDLDIRECT 120.0 06/17/2019   TRIG 163.0 (H) 06/09/2020   CHOLHDL 3 06/09/2020  -She continues on Repatha without side effects -At last visit, she was only on 500 mg fish oil daily.  I advised her to increase this to 1000 mg twice a day.  Philemon Kingdom, MD PhD [facility]

## 2020-11-30 NOTE — Patient Instructions (Addendum)
Please decrease: - Lantus 4 units in am and 5 units at bedtime  - R insulin: - insulin to carb ratio (ICR)   B'fast: 10:1  Lunch: 10:1   Dinner: 20:1  - target 150 - insulin sensitivity factor (ISF) 60: 150-200: + 1 unit 201-260: + 2 units 261-320: + 3 units 321-380: + 4 units >380: + 5 units Do not correct bedtime sugars <300, and only inject 1 unit then.   Stop muffins in am. Try half a sandwich with whole grain bread instead or oatmeal.  Try to correct low blood sugars with only 2-3 oz at a time.  Please return in 3-4 months with your sugar log.

## 2020-12-04 DIAGNOSIS — H35372 Puckering of macula, left eye: Secondary | ICD-10-CM | POA: Diagnosis not present

## 2020-12-04 DIAGNOSIS — E103311 Type 1 diabetes mellitus with moderate nonproliferative diabetic retinopathy with macular edema, right eye: Secondary | ICD-10-CM | POA: Diagnosis not present

## 2020-12-04 DIAGNOSIS — E103592 Type 1 diabetes mellitus with proliferative diabetic retinopathy without macular edema, left eye: Secondary | ICD-10-CM | POA: Diagnosis not present

## 2020-12-04 DIAGNOSIS — H35033 Hypertensive retinopathy, bilateral: Secondary | ICD-10-CM | POA: Diagnosis not present

## 2020-12-07 ENCOUNTER — Ambulatory Visit: Payer: Self-pay | Admitting: Internal Medicine

## 2020-12-12 ENCOUNTER — Ambulatory Visit: Payer: BC Managed Care – PPO | Admitting: Cardiovascular Disease

## 2020-12-15 ENCOUNTER — Encounter: Payer: Self-pay | Admitting: Internal Medicine

## 2021-01-06 ENCOUNTER — Other Ambulatory Visit: Payer: Self-pay | Admitting: Internal Medicine

## 2021-02-01 MED ORDER — NITROGLYCERIN 0.4 MG SL SUBL: 0.4000 mg | SUBLINGUAL_TABLET | SUBLINGUAL | 3 refills | Status: DC | PRN

## 2021-02-01 MED ORDER — ISOSORBIDE MONONITRATE ER 30 MG PO TB24: 30.0000 mg | ORAL_TABLET | Freq: Every day | ORAL | 3 refills | Status: DC

## 2021-02-01 NOTE — Telephone Encounter (Signed)
Spoke with pt on the phone regarding pain on the left side of her chest. Pt states that it started about 3 weeks ago and felt like it could be breast tissue related, pt called her OB/GYN who told her that her recent mammogram was normal and advised her to give it a few weeks to see if the pain would improve.  Pt states that she considers it a discomfort that just won't completely go away. Pt states that the discomfort increases to pain with exercise or stress at work.  Pt describes a walk last night with her husband and the pain got pretty intense. Pt states vitals have not changed with blood pressure ranges being 100s over 60s&70s with heart rate in the 70s typically. Asked pt about being placed on imdur.  Pt states that she was prescribed this medication back in September and filled it but didn't actually start taking it and eventually threw the medication away. Pt asked if she has any nitroglycerin at home and she states that she has never been given a prescription for SL nitro.  Will forward to Dr. Salena Saner to advise.

## 2021-02-01 NOTE — Telephone Encounter (Signed)
Yes, Imdur 30 mg in AM

## 2021-02-01 NOTE — Telephone Encounter (Signed)
Please re-prescribe the imdur as well as SL NTG. She tends to always run a low BP, but should tolerate the imdur. It will have a minor and gradual effect on her BP. Please instruct her to always take the SL NTG when sitting down or lying down. It can quickly drop her BP (but briefly). Please instruct in how to take NTG (Q5 minutes, max 3 for pain that will not relent) and tell her to go to ED for pain that is severe an lasts>30 minutes. Please schedule office appointment.

## 2021-02-02 ENCOUNTER — Encounter: Payer: Self-pay | Admitting: *Deleted

## 2021-02-02 DIAGNOSIS — U071 COVID-19: Secondary | ICD-10-CM

## 2021-02-02 HISTORY — DX: COVID-19: U07.1

## 2021-02-02 NOTE — Telephone Encounter (Signed)
We can try Ranexa 500 mg twice daily. It does not affect the BP (although you will see dizziness listed as a potential side effect, it does not occur due to BP changes)

## 2021-02-05 ENCOUNTER — Telehealth: Payer: Self-pay

## 2021-02-05 DIAGNOSIS — E103293 Type 1 diabetes mellitus with mild nonproliferative diabetic retinopathy without macular edema, bilateral: Secondary | ICD-10-CM

## 2021-02-05 NOTE — Telephone Encounter (Signed)
Patient called in stating that she just recently changed her insurance plan and her new plan doesn't work with there other provider she was using with the  Dexcom. She is needing a new Rx sent to her regular pharmacy.    3 boxes of sensors  1 transmitter   [id number], [place] - [address]

## 2021-02-06 MED ORDER — DEXCOM G6 SENSOR MISC: 0 refills | Status: DC

## 2021-02-06 MED ORDER — DEXCOM G6 TRANSMITTER MISC: 0 refills | Status: DC

## 2021-02-06 NOTE — Telephone Encounter (Signed)
Rx sent to preferred pharmacy.

## 2021-02-09 ENCOUNTER — Telehealth: Payer: Self-pay | Admitting: Internal Medicine

## 2021-02-09 DIAGNOSIS — N644 Mastodynia: Secondary | ICD-10-CM | POA: Insufficient documentation

## 2021-02-09 DIAGNOSIS — E103293 Type 1 diabetes mellitus with mild nonproliferative diabetic retinopathy without macular edema, bilateral: Secondary | ICD-10-CM

## 2021-02-09 DIAGNOSIS — E103592 Type 1 diabetes mellitus with proliferative diabetic retinopathy without macular edema, left eye: Secondary | ICD-10-CM | POA: Diagnosis not present

## 2021-02-09 MED ORDER — DEXCOM G6 SENSOR MISC: 0 refills | Status: DC

## 2021-02-09 NOTE — Telephone Encounter (Signed)
Rx corrected.

## 2021-02-09 NOTE — Telephone Encounter (Signed)
New message    Patient only received 2 boxes of the sensors the request was for 3 boxes - 90 days supplies    [facility] [place]

## 2021-02-13 DIAGNOSIS — N644 Mastodynia: Secondary | ICD-10-CM | POA: Diagnosis not present

## 2021-02-21 ENCOUNTER — Ambulatory Visit: Payer: BC Managed Care – PPO | Admitting: Cardiovascular Disease

## 2021-02-27 ENCOUNTER — Telehealth: Payer: Self-pay

## 2021-02-27 DIAGNOSIS — U071 COVID-19: Secondary | ICD-10-CM

## 2021-02-27 DIAGNOSIS — E1069 Type 1 diabetes mellitus with other specified complication: Secondary | ICD-10-CM

## 2021-02-27 NOTE — Telephone Encounter (Addendum)
Spoke with pt relaying Dr. Timoteo Expose message.  Pt verbalizes understanding and states she did not get the COVID booster.  She requests the CDC info and the vitamin recommendations be sent to her MyChart so she will have as a reference.   Sent info via [employer].

## 2021-02-27 NOTE — Telephone Encounter (Signed)
Patient called stating she took a home test this morning for COVID and it was positive. Symptoms started on 02/24/21-congestion, coughing-slight productivity-clear phlegm, nausea, congested and runny nose, post nasal drip, head congested, muscle aches. NO fever. Patient has CAD-hx of heart bypass, diabetes. Patient states that she knows there is a medication that someone can take to help with COVID symptoms and with her health history she wanted to get on that ASAP. There are no appointments with anyone until Thursday this week. Patient took her vitals this morning and B/P was  116/66, pulse 92. No chest pain, no SOB. Patient would like your advice since we did not have anything open in the clinic.

## 2021-02-27 NOTE — Telephone Encounter (Signed)
Referral placed to outpatient COVID treatment team. Pt would benefit from Paxlovid or possible mAb infusion.   Let us know right away if any worsening shortness of breath or persistent productive cough or fever.   First day of symptoms 4/23. She is vaccinated with [employer]. Did she receive booster?  Follow these current CDC guidelines for self-isolation: - Stay home for 5 days, starting the day after your symptoms (The first day is really day 0). - If you have no symptoms or your symptoms are resolving after 5 days, you can leave your house. - Continue to wear a mask around others for 5 additional days. **If you have a fever, continue to stay home until your fever resolves for 24 hours without fever-reducing medications.**  Here are some of the supportive therapies that may help with symptoms of a COVID-19 infection: A. Zinc Lozenge 75 to 100mg  daily B. Vitamin C 500mg  twice a day C. Vitamin D (Cholecalciferol) 2000 IU daily

## 2021-02-28 ENCOUNTER — Telehealth (HOSPITAL_COMMUNITY): Payer: Self-pay

## 2021-02-28 MED ORDER — NIRMATRELVIR/RITONAVIR (PAXLOVID)TABLET: 3.0000 | ORAL_TABLET | Freq: Two times a day (BID) | ORAL | 0 refills | Status: DC

## 2021-02-28 NOTE — Addendum Note (Signed)
Addended by: [name] on: 02/28/2021 06:00 PM   Modules accepted: Orders

## 2021-02-28 NOTE — Telephone Encounter (Signed)
Called and spoke with patient who was upset that no one from the infusion team had reached out to her since it was day 5 of her symptoms and after today the pill or infusion would be no good. Spoke with Dr. [name] regarding this issue and he said he would speak with her. Sending to Dr. Reece Agar.

## 2021-02-28 NOTE — Telephone Encounter (Signed)
Pt daughter called in wanted to know about the infusion and/or medication for her due to getting its day 5.

## 2021-02-28 NOTE — Telephone Encounter (Addendum)
Spoke with patient. I'm not sure why outpt team hasn't reached out to pt as I believe I ordered this urgent.  Spoke with patient regarding mAb infusion vs paxlovid. She desires Paxlovid treatment. Reviewed possible side effects. [facility] in to [facility] which [facility] med available.   Please call tomorrow to schedule stat curbside lab visit to check Cr ensure >60. labs ordered.

## 2021-02-28 NOTE — Telephone Encounter (Signed)
Pt called concerned that she has not received a call from the treatment center yet and today will be day 5...Felicia [name] pt would like to know if anything can be done to speed up the process as she knows she can not take the pill form treatment after day 5... explained to pt that it had not even been 24 hours since referral was placed and the covid treatment center will be giving her a call  Please advise

## 2021-02-28 NOTE — Telephone Encounter (Signed)
Can you follow up with this plz?

## 2021-02-28 NOTE — Telephone Encounter (Signed)
Called to discuss with patient about COVID-19 symptoms and the use of one of the available treatments for those with mild to moderate Covid symptoms and at a high risk of hospitalization.  Pt appears to qualify for outpatient treatment due to co-morbid conditions and/or a member of an at-risk group in accordance with the FDA Emergency Use Authorization.    Symptom onset: 02/24/21 Vaccinated: Yes Booster: No Immunocompromised: No Qualifiers: CAD, MI, DM, HTN, HLD, CABG NIH Criteria: Tier 2  Pt stated her MD called her in Paxlovid that she will start taking tonight.   Essie Hart, RN

## 2021-03-01 ENCOUNTER — Other Ambulatory Visit (INDEPENDENT_AMBULATORY_CARE_PROVIDER_SITE_OTHER): Payer: BC Managed Care – PPO

## 2021-03-01 DIAGNOSIS — E1069 Type 1 diabetes mellitus with other specified complication: Secondary | ICD-10-CM

## 2021-03-01 DIAGNOSIS — U071 COVID-19: Secondary | ICD-10-CM | POA: Diagnosis not present

## 2021-03-01 DIAGNOSIS — E785 Hyperlipidemia, unspecified: Secondary | ICD-10-CM | POA: Diagnosis not present

## 2021-03-01 LAB — BASIC METABOLIC PANEL
BUN: 22 mg/dL (ref 6–23)
CO2: 28 mEq/L (ref 19–32)
Calcium: 8.6 mg/dL (ref 8.4–10.5)
Chloride: 99 mEq/L (ref 96–112)
Creatinine, Ser: 1.18 mg/dL (ref 0.40–1.20)
GFR: 51.08 mL/min — ABNORMAL LOW (ref >60.00)
Glucose, Bld: 282 mg/dL — ABNORMAL HIGH (ref 70–99)
Potassium: 4 mEq/L (ref 3.5–5.1)
Sodium: 135 mEq/L (ref 135–145)

## 2021-03-01 LAB — LIPID PANEL
Cholesterol: 114 mg/dL (ref 0–200)
HDL: 45.3 mg/dL (ref >39.00)
LDL Cholesterol: 49 mg/dL (ref 0–99)
NonHDL: 69.06
Total CHOL/HDL Ratio: 3
Triglycerides: 99 mg/dL (ref 0.0–149.0)
VLDL: 19.8 mg/dL (ref 0.0–40.0)

## 2021-03-01 NOTE — Telephone Encounter (Signed)
Spoke with pt scheduling car-side lab visit today at 2:30.

## 2021-03-02 NOTE — Telephone Encounter (Signed)
Spoke with patient - she is feeling better since starting Paxlovid. Now husband tested positive, starting treatment today.  Regarding GFR 51 - recommend dropping dose of nirmatrelvir to 1 tab BID for rest of treatment course, continue ritonavir 1 tab BID.  Pt agrees with plan.

## 2021-03-08 ENCOUNTER — Other Ambulatory Visit: Payer: Self-pay

## 2021-03-08 MED ORDER — REPATHA SURECLICK 140 MG/ML ~~LOC~~ SOAJ: 140.0000 mg | SUBCUTANEOUS | 11 refills | Status: DC

## 2021-03-20 ENCOUNTER — Telehealth: Payer: Self-pay | Admitting: Cardiovascular Disease

## 2021-03-20 NOTE — Telephone Encounter (Signed)
Pt c/o medication issue:  1. Name of Medication: Evolocumab (REPATHA SURECLICK) 140 MG/ML SOAJ  2. How are you currently taking this medication (dosage and times per day)? n/a  3. Are you having a reaction (difficulty breathing--STAT)? no  4. What is your medication issue? CoverMyMeds following up on [place] for this medication. Reference # [id number]

## 2021-03-21 NOTE — Telephone Encounter (Signed)
Submitted responses on cmm  Felicia [name] (Key: [id number]) Repatha SureClick 140MG /ML auto-injectors   Form Blue Form (CB) Created 2 minutes ago Sent to Plan 1 minute ago Plan Response 1 minute ago Submit Clinical Questions less than a minute ago Determination Wait for Determination Please wait for Advertising account executive to return a determination.

## 2021-03-22 ENCOUNTER — Other Ambulatory Visit: Payer: Self-pay | Admitting: *Deleted

## 2021-03-22 MED ORDER — NOVOFINE PEN NEEDLE 32G X 6 MM MISC: 1 refills | Status: DC

## 2021-03-23 ENCOUNTER — Other Ambulatory Visit: Payer: Self-pay | Admitting: Internal Medicine

## 2021-03-23 DIAGNOSIS — E103293 Type 1 diabetes mellitus with mild nonproliferative diabetic retinopathy without macular edema, bilateral: Secondary | ICD-10-CM

## 2021-03-23 MED ORDER — REPATHA SURECLICK 140 MG/ML ~~LOC~~ SOAJ
140.0000 mg | SUBCUTANEOUS | 3 refills | Status: DC
Start: 2021-03-23 — End: 2021-03-27

## 2021-03-27 ENCOUNTER — Telehealth: Payer: Self-pay | Admitting: Internal Medicine

## 2021-03-27 ENCOUNTER — Other Ambulatory Visit: Payer: Self-pay | Admitting: Cardiovascular Disease

## 2021-03-27 MED ORDER — INSULIN SYRINGE-NEEDLE U-100 31G X 15/64" 1 ML MISC
5 refills | Status: DC
Start: 2021-03-27 — End: 2021-03-30

## 2021-03-27 NOTE — Telephone Encounter (Signed)
Refill sent.

## 2021-03-27 NOTE — Telephone Encounter (Signed)
RX REQUEST FOR-  BD 49mm Syringes    PHARMACY-  [id number] [place], [place] - [address] Phone:  [phone]  Fax:  [phone]

## 2021-03-30 MED ORDER — "BD VEO INSULIN SYRINGE U/F 31G X 15/64"" 0.3 ML MISC": 2 refills | Status: DC

## 2021-03-30 NOTE — Telephone Encounter (Signed)
Patient needs syringe RX to be corrected - she needs : purple box , 3/10 capacity, 6 mm  - 90 day supply is 3 boxes of 100 b/c of using 5-6 per day for insulin  [facility] [place]

## 2021-03-30 NOTE — Addendum Note (Signed)
Addended by: Tawnya Crook on: 03/30/2021 10:35 AM   Modules accepted: Orders

## 2021-03-30 NOTE — Telephone Encounter (Signed)
Refill as requested 

## 2021-04-03 ENCOUNTER — Telehealth (INDEPENDENT_AMBULATORY_CARE_PROVIDER_SITE_OTHER): Payer: BC Managed Care – PPO | Admitting: Internal Medicine

## 2021-04-03 ENCOUNTER — Encounter: Payer: Self-pay | Admitting: Internal Medicine

## 2021-04-03 ENCOUNTER — Other Ambulatory Visit: Payer: Self-pay

## 2021-04-03 DIAGNOSIS — E103293 Type 1 diabetes mellitus with mild nonproliferative diabetic retinopathy without macular edema, bilateral: Secondary | ICD-10-CM | POA: Diagnosis not present

## 2021-04-03 DIAGNOSIS — E1069 Type 1 diabetes mellitus with other specified complication: Secondary | ICD-10-CM

## 2021-04-03 DIAGNOSIS — E785 Hyperlipidemia, unspecified: Secondary | ICD-10-CM | POA: Diagnosis not present

## 2021-04-03 NOTE — Progress Notes (Signed)
Patient ID: Felicia [name], female   DOB: November 06, 1960, 60 y.o.   MRN: [id number]  Patient location: Home My location: Office Persons participating in the virtual visit: patient, provider  Referring Provider: Ria Bush, MD  I connected with the patient on 04/03/21 at  3:51 PM EDT by a video enabled telemedicine application and verified that I am speaking with the correct person.   I discussed the limitations of evaluation and management by telemedicine and the availability of in person appointments. The patient expressed understanding and agreed to proceed.   Details of the encounter are shown below.  HPI: Felicia [name] is a 60 y.o.-year-old female, presenting for follow-up for DM1, dx in 55 (age 60), uncontrolled, with complications (CAD - s/p 5v CABG, mild retinopathy, gastroparesis). Last visit 4 months ago.   Interim hx: No new sxs since last OV. She is still having high blood sugars overnight and in the morning.  Reviewed HbA1c levels: Lab Results  Component Value Date   HGBA1C 7.5 (A) 11/30/2020   HGBA1C 7.0 (A) 07/28/2020   HGBA1C 8.9 (H) 02/17/2020   She was previously on insulin pump for 5 years (~2010) but she did not like it.  Sugars were not better on this.  She is not interested in getting back on the pump.    She is on: - Tresiba 12 units in am >> ... >> Lantus 8 in a.m. and 6 units at bedtime >> 6 units twice a day >> 4 units in am and 6 units in pm >> 4 units in am and 5 units at night  - R insulin: - insulin to carb ratio (ICR)   B'fast: 5:1   Lunch: 10:1   Dinner: 20:1 - target 150 - insulin sensitivity factor (ISF) 60: Insulin glargine 150-200: + 1 unit 201-260: + 2 units 261-320: + 3 units 321-380: + 4 units >380: + 5 units Do not correct bedtime sugars <300, and only inject 1 unit then.  For the snack at night, do not take more than 1-2 units of regular insulin!  We tried Metformin ER 1000 mg with dinner but stopped due to lack of  effect. Tried [place] >> she did not feel that this worked as well as Lantus. Tried Toujeo >> allergy: CP, SOB.  Meter: ReliOn  She checks her blood sugars more than 4 times a day with her Dexcom CGM.  [facility]   Previously:    Lowest sugar was 38 >>... 45 >> 55; + hypoglycemia awareness in the 70s.  No recent hyper or hypoglycemia admissions.  She has a glucagon kit at home. Highest sugar was 500s (bagel) >> 436 >> 266 >> 331 >> 254 >> 300s.   Pt's meals are: - Breakfast: protein bar + fruit >> granola bar (25 g carbs) - 10 am snack  - Lunch: apple + cheese, carrots - Snack bar: 15g carbs - Glucerna - Dinner: meat + 1/2 backed potato + veggies/salad - Snacks: popcorn, salty snacks: Potato chips  -No CKD, last BUN/creatinine:  Lab Results  Component Value Date   BUN 22 03/01/2021   CREATININE 1.18 03/01/2021  Previously on quinapril, now off. + HL; last set of lipids: Lab Results  Component Value Date   CHOL 114 03/01/2021   HDL 45.30 03/01/2021   LDLCALC 49 03/01/2021   LDLDIRECT 120.0 06/17/2019   TRIG 99.0 03/01/2021   CHOLHDL 3 03/01/2021  She tried pravastatin, lovastatin, Zetia, even adding co-Q10 but she still had muscle cramps >>  now on Repatha, tolerated well. She also uses uses 2 creams: Theraworx + Muscle calm.   - last eye exam was on 09/17/2020: + DR - no numbness and tingling in her feet.  On ASA 81.  Latest TSH is normal: Lab Results  Component Value Date   TSH 2.744 02/20/2020   She also has a history of HTN.  ROS: Constitutional: no weight gain/no weight loss, no fatigue, no subjective hyperthermia, no subjective hypothermia Eyes: no blurry vision, no xerophthalmia ENT: no sore throat, no nodules palpated in neck, no dysphagia, no odynophagia, no hoarseness Cardiovascular: no CP/no SOB/no palpitations/no leg swelling Respiratory: no cough/no SOB/no wheezing Gastrointestinal: no N/no V/no D/no C/no acid reflux Musculoskeletal: no  muscle aches/no joint aches Skin: no rashes, no hair loss Neurological: no tremors/no numbness/no tingling/no dizziness  I reviewed pt's medications, allergies, PMH, social hx, family hx, and changes were documented in the history of present illness. Otherwise, unchanged from my initial visit note.  Past Medical History:  Diagnosis Date  . Allergy   . Anemia    past history long ago  . Borderline HTN (hypertension)   . CAD (coronary artery disease)    a. 02/2020 Cath: LM nl, LAD large, 90p, 56m D1 90p, LCX nl, OM 60-70p, RCA dominant w/ dampening upon engagement of ostium. 70p/m, RPDA 50. EF 55-65%.  . COVID-19 virus infection 02/2021  . Disorder of kidney    has abd kidney (right)  . GERD (gastroesophageal reflux disease)   . History of diabetic gastroparesis   . History of echocardiogram    a. 01/2013 Echo: EF 55-65%, no rwma. Mild MR. Nl RV fxn.  .Marland KitchenHLD (hyperlipidemia)   . Hx: UTI (urinary tract infection)   . IBS (irritable bowel syndrome)   . Nephrolithiasis 2005  . Osteopenia 2012   per pt  . Systolic murmur 37/4827  mild mitral insuff  . Type 1 diabetes mellitus with diabetic retinopathy without macular edema (HCC)    dx at 60y/o, background retinopathy   Past Surgical History:  Procedure Laterality Date  . CESAREAN SECTION  1985  . COLONOSCOPY  07/2016   WNL (Pyrtle)  . CORONARY ARTERY BYPASS GRAFT N/A 02/21/2020   Procedure: CORONARY ARTERY BYPASS GRAFTING (CABG) times five using left internal mammary and right leg saphenous vein;  Surgeon: LLajuana Matte MD;  Location[facility]  Service: Open Heart Surgery;  Laterality: N/A;  . ESOPHAGOGASTRODUODENOSCOPY  07/2016   LA Grade C reflux esophagitis, concern for stasis/dysmotility ([place]le)  . LAPAROSCOPIC APPENDECTOMY N/A 09/01/2015   Procedure: APPENDECTOMY LAPAROSCOPIC;  Surgeon: JJudeth Horn MD;  [facility]  Service: General;  Laterality: N/A;  . LEFT HEART CATH AND CORONARY ANGIOGRAPHY N/A 02/17/2020    Procedure: LEFT HEART CATH AND CORONARY ANGIOGRAPHY;  Surgeon: GMinna Merritts MD;  [facility];  Service: Cardiovascular;  Laterality: N/A;  . LEFT HEART CATH AND CORS/GRAFTS ANGIOGRAPHY N/A 02/29/2020   Procedure: LEFT HEART CATH AND CORS/GRAFTS ANGIOGRAPHY;  Surgeon: MBurnell Blanks MD;  [facility];  Service: Cardiovascular;  Laterality: N/A;  . ORIF FEMUR FRACTURE Left 1996   Corrected by patient in history 2016  . PARTIAL HYSTERECTOMY  2000   [facility]  . TEE WITHOUT CARDIOVERSION N/A 02/21/2020   Procedure: TRANSESOPHAGEAL ECHOCARDIOGRAM (TEE);  Surgeon: LLajuana Matte MD;  [facility]  Service: Open Heart Surgery;  Laterality: N/A;  . TONSILLECTOMY AND ADENOIDECTOMY  1975  .  TRIGGER FINGER RELEASE Left 12/2010   thumb   Social History   Socioeconomic History  . Marital status: Married    Spouse name: Not on file  . Number of children: 1  . Years of education: 15  . Highest education level: Bachelor's degree (e.g., BA, AB, BS)  Occupational History  . 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 Smoking status: Never Smoker  . Smokeless tobacco: Never Used  Vaping Use  . Vaping Use: Never used  Substance and Sexual Activity  . Alcohol use: No    Alcohol/week: 0.0 standard drinks  . Drug use: No  . Sexual activity: Yes    Birth control/protection: None    Comment: Married  Other Topics Concern  . 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  Has one daughter.  Education: college.      1 dog   Social Determinants of Radio broadcast assistant Strain: Not on file  Food Insecurity: Not on file  Transportation Needs: Not on file  Physical Activity: Not on file  Stress: Not on file  Social Connections: Not on file  Intimate Partner Violence: Not on file    Current Outpatient Medications on File Prior to Visit  Medication Sig Dispense Refill  . aspirin 81 MG EC tablet Take 1 tablet (81 mg total) by mouth daily.    . Continuous Blood Gluc Sensor (DEXCOM G6 SENSOR) MISC Use as instructed to check blood sugar change every 10 days 9 each 0  . Continuous Blood Gluc Transmit (DEXCOM G6 TRANSMITTER) MISC USE AS INSTRUCTED CHANGE EVERY 90 DAYS 1 each 0  . esomeprazole (NEXIUM) 40 MG capsule Take 1 capsule (40 mg total) by mouth daily. Patient needs office visit for further refills 90 capsule 0  . glucagon (GLUCAGON EMERGENCY) 1 MG injection Inject 1 mg into the muscle once as needed (in case of severe hypoglycemia). 1 each 12  . Insulin Pen Needle (NOVOFINE PEN NEEDLE) 32G X 6 MM MISC USE TO INJECT INSULIN 4 TIMES A DAY 200 each 1  . insulin regular (NOVOLIN R) 100 units/mL injection INJECT 6 UNITS TOTAL INTO THE SKIN 3 (THREE) TIMES DAILY BEFORE MEALS. (Patient taking differently: Injecting 5 to 20 units before meals) 20 mL 5  . Insulin Syringe-Needle U-100 (BD VEO INSULIN SYRINGE U/F) 31G X 15/64" 0.3 ML MISC Use to inject insulin 5-6 times a day 300 each 2  . isosorbide mononitrate (IMDUR) 30 MG 24 hr tablet Take 1 tablet (30 mg total) by mouth daily. 30 tablet 3  . LANTUS SOLOSTAR 100 UNIT/ML Solostar Pen INJECT 12 UNITS INTO THE SKIN EVERY DAY 15 mL 1  . Multiple Vitamin (MULTIVITAMIN) tablet Take 1 tablet by mouth daily.    . nirmatrelvir/ritonavir EUA (PAXLOVID) TABS Take 3 tablets by mouth 2 (two) times daily. Patient GFR is >60. Take nirmatrelvir (150 mg) 2 tablet(s) twice daily for 5 days and ritonavir (100 mg) one tablet twice daily for 5 days. 30 tablet 0  . nitroGLYCERIN (NITROSTAT) 0.4 MG SL tablet Place 1 tablet (0.4 mg total) under the tongue every 5 (five) minutes as needed for chest pain. 25 tablet 3  . REPATHA SURECLICK 314 MG/ML SOAJ INJECT 140 MG INTO THE SKIN EVERY 14 (FOURTEEN) DAYS. 6 mL 3   No current  facility-administered  medications on file prior to visit.   Allergies  Allergen Reactions  . Protonix [Pantoprazole] Swelling    Facial Swelling  . Toujeo Solostar [Insulin Glargine] Shortness Of Breath  . Lactose Intolerance (Gi)     GI  . Magnesium-Containing Compounds Swelling and Other (See Comments)    limbs  . Adhesive [Tape] Rash and Other (See Comments)    Band aids   Family History  Problem Relation Age of Onset  . Diabetes Maternal Grandmother   . Heart failure Maternal Grandmother   . COPD Maternal Grandmother        smoker  . Thyroid disease Maternal Grandmother   . Vascular Disease Maternal Grandmother   . Colon cancer Maternal Grandfather   . Colon polyps Mother   . Breast cancer Other   . CAD Brother 20       stent  . Esophageal cancer Neg Hx   . Rectal cancer Neg Hx   . Stomach cancer Neg Hx    PE: There were no vitals taken for this visit. There is no height or weight on file to calculate BMI.  Wt Readings from Last 3 Encounters:  11/30/20 132 lb 6.4 oz (60.1 kg)  11/23/20 132 lb (59.9 kg)  08/03/20 135 lb (61.2 kg)   Constitutional:  in NAD  The physical exam was not performed (virtual visit).   ASSESSMENT: 1. DM1, uncontrolled, with complications - CAD - s/p 5v CABG - 02/21/2020 - Mild DR - Gastroparesis - This is the reason why she is on regular insulin, and she is taking it 10 minutes  rather than 30 minutes before eating  She had low CBGs with Novolog!  2. HL  PLAN:  1.  Patient with difficult to control type 1 diabetes, longstanding, on a basal-bolus insulin regimen.  She is very insulin sensitive so we use low doses of insulin while.  She tried an insulin pump in the past but did not like it.  She was initially also reticent to start a CGM, but she finally accepted to try the Dexcom.  -At last visit, reviewing Dexcom downloads, sugars were very variable overnight and also during the day and we discussed about possible reasons for the variability.  She  reported that blood sugars were better controlled when she injected insulin in her arms, also discussed about improving breakfast that she was eating a muffin in the morning.  She was also overcompensating for low blood sugars and we discussed about taking less carbs for correction.  She was also dropping her sugars too much after exercise.  We also discussed that her gastroparesis may influence her blood sugar on caused him to become unpredictable.  We decreased her Lantus dose at night  from 6 units to 5 units. CGM interpretation: -At today's visit, we reviewed her CGM downloads: It appears that 63.1% of values are in target range (goal >70%), while 35.6% are higher than 180 (goal <25%), and 1.4% are lower than 70 (goal <4%).  The calculated average blood sugar is 166.  The projected HbA1c for the next 3 months (GMI) is 7.3%, lower than the one projected at last visit. -Reviewing the CGM trends, sugars appear to be improved in the last 2 days compared to the previous 2 weeks.  They are higher during the night, increasing after approximately 3 AM and staying elevated mostly throughout the morning and early afternoon.  They are again mostly at target around 4-5 PM and then increasing after dinner, but  not consistently.  She is also dropping her sugars occasionally after breakfast if she does for a walk before breakfast. -For now, I advised her to take less insulin for breakfast if she walked in the morning, changing the insulin to carb ratio from 1: 5-1: 10 -We do not have many options to improve her blood sugars at night as she may drop her blood sugars if we increase her Lantus at bedtime.  However, patient mentioned that she is willing to wake up in the middle of the night to give herself regular insulin before the sugars start to increase around 4 AM.  In that case, I advised her to try to take Lantus at that time, 6 units around 3-4 PM and 5 units at 3-4 PM.  If she finds that she is not able to wake up  and give herself insulin around these times, then I advised her to stay with 4 units of Lantus in the morning and  increase the dose at bedtime to 6 units. -We again discussed about the possibility of an insulin pump which would definitely help with improving her blood sugars overnight, but she would not want to retry this. - I suggested to:  Patient Instructions  Please change: - Lantus 6 units around 3-4 am and 5 units at 3-4 pm  (if this does not wotk, use: 4 units in am and 6 units at bedtime).  Continue: - R insulin: - insulin to carb ratio (ICR)   B'fast: 5:1 (if going for a walk: 10:1)  Lunch: 10:1   Dinner: 20:1  - target 150 - insulin sensitivity factor (ISF) 60: 150-200: + 1 unit 201-260: + 2 units 261-320: + 3 units 321-380: + 4 units >380: + 5 units Do not correct bedtime sugars <300, and only inject 1 unit then.   - we checked her HbA1c: 7.5% (higher) - advised to check sugars at different times of the day - 4x a day, rotating check times - advised for yearly eye exams >> she is UTD - return to clinic in 3-4 months     2. HL -Reviewed latest lipid panel from 06/2020: LDL much improved, now at goal, triglycerides slightly high Lab Results  Component Value Date   CHOL 114 03/01/2021   HDL 45.30 03/01/2021   LDLCALC 49 03/01/2021   LDLDIRECT 120.0 06/17/2019   TRIG 99.0 03/01/2021   CHOLHDL 3 03/01/2021  -She continues on Repatha without side effects.  She tolerates it well.  Also on fish oil.  Philemon Kingdom, MD PhD [facility] Endocrinology

## 2021-04-03 NOTE — Patient Instructions (Addendum)
Please change: - Lantus 6 units around 3-4 am and 5 units at 3-4 pm  (if this does not work, use: 4 units in am and 6 units at bedtime).  Continue: - R insulin: - insulin to carb ratio (ICR)   B'fast: 5:1 (if going for a walk: 10:1)  Lunch: 10:1   Dinner: 20:1  - target 150 - insulin sensitivity factor (ISF) 60: 150-200: + 1 unit 201-260: + 2 units 261-320: + 3 units 321-380: + 4 units >380: + 5 units Do not correct bedtime sugars <300, and only inject 1 unit then.

## 2021-04-04 ENCOUNTER — Telehealth: Payer: Self-pay | Admitting: Cardiovascular Disease

## 2021-04-04 MED ORDER — REPATHA SURECLICK 140 MG/ML ~~LOC~~ SOAJ: 140.0000 mg | SUBCUTANEOUS | 3 refills | Status: DC

## 2021-04-04 NOTE — Telephone Encounter (Signed)
Returned a call to the pt and we threeway called the pharmacy and they stated that they made a mistake and will correct it then re call the pt when the error is corrected

## 2021-04-04 NOTE — Telephone Encounter (Signed)
Pt c/o medication issue:  1. Name of Medication: REPATHA SURECLICK 140 MG/ML SOAJ  2. How are you currently taking this medication (dosage and times per day)?  As prescribed  3. Are you having a reaction (difficulty breathing--STAT)?  No   4. What is your medication issue?  Patient states she is running out of medication and [employer]BS needs PA letter for coverage. She states the letter also needs to state Dr. Royann Shivers is the patient's cardiologist because [employer]BS still has Dr. Mariah Milling listed. She states the letter needs to go through CoverMyMeds and she requested a call back to discuss further. She states she will be in an online meeting from 2:00 PM-3:00 PM today, but any time before or after is fine.  covermymeds  webinar 2-3

## 2021-04-06 DIAGNOSIS — H35372 Puckering of macula, left eye: Secondary | ICD-10-CM | POA: Diagnosis not present

## 2021-04-06 DIAGNOSIS — E103592 Type 1 diabetes mellitus with proliferative diabetic retinopathy without macular edema, left eye: Secondary | ICD-10-CM | POA: Diagnosis not present

## 2021-04-06 DIAGNOSIS — H43821 Vitreomacular adhesion, right eye: Secondary | ICD-10-CM | POA: Diagnosis not present

## 2021-04-06 DIAGNOSIS — E103311 Type 1 diabetes mellitus with moderate nonproliferative diabetic retinopathy with macular edema, right eye: Secondary | ICD-10-CM | POA: Diagnosis not present

## 2021-04-12 ENCOUNTER — Other Ambulatory Visit: Payer: Self-pay | Admitting: Internal Medicine

## 2021-04-17 ENCOUNTER — Telehealth: Payer: Self-pay | Admitting: Pharmacy Technician

## 2021-04-17 NOTE — Telephone Encounter (Addendum)
jPatient Advocate Encounter   Received notification from [facility] that prior authorization for [facility] TRANSMITTER is required.   PA submitted on 04/17/2021 Key [id number] Status is APPROVED through 04/16/2022    [facility] will continue to follow.   Netty Starring. Dimas Aguas, CPhT Patient Advocate [facility] Phone: [phone] Fax:  [phone]

## 2021-05-10 ENCOUNTER — Encounter: Payer: Self-pay | Admitting: Family Medicine

## 2021-05-18 ENCOUNTER — Other Ambulatory Visit: Payer: Self-pay | Admitting: Obstetrics and Gynecology

## 2021-05-18 DIAGNOSIS — N644 Mastodynia: Secondary | ICD-10-CM

## 2021-05-24 ENCOUNTER — Telehealth: Payer: Self-pay

## 2021-05-24 NOTE — Telephone Encounter (Signed)
Called and spoke w/representative to get a verbal rx to replace the repatha that she had an issue or malfunction with and they voiced that they would replace it and contact the pt

## 2021-06-01 ENCOUNTER — Ambulatory Visit
Admission: RE | Admit: 2021-06-01 | Discharge: 2021-06-01 | Disposition: A | Discharge status: Home / Self Care | Payer: BC Managed Care – PPO | Source: Ambulatory Visit | Attending: Obstetrics and Gynecology | Admitting: Obstetrics and Gynecology

## 2021-06-01 ENCOUNTER — Other Ambulatory Visit: Payer: Self-pay

## 2021-06-01 DIAGNOSIS — N644 Mastodynia: Secondary | ICD-10-CM | POA: Diagnosis not present

## 2021-06-01 DIAGNOSIS — R922 Inconclusive mammogram: Secondary | ICD-10-CM | POA: Diagnosis not present

## 2021-06-10 ENCOUNTER — Other Ambulatory Visit: Payer: Self-pay | Admitting: Family Medicine

## 2021-06-10 DIAGNOSIS — IMO0002 Reserved for concepts with insufficient information to code with codable children: Secondary | ICD-10-CM

## 2021-06-10 DIAGNOSIS — E785 Hyperlipidemia, unspecified: Secondary | ICD-10-CM

## 2021-06-10 DIAGNOSIS — E1065 Type 1 diabetes mellitus with hyperglycemia: Secondary | ICD-10-CM

## 2021-06-10 DIAGNOSIS — E1069 Type 1 diabetes mellitus with other specified complication: Secondary | ICD-10-CM

## 2021-06-11 ENCOUNTER — Other Ambulatory Visit (INDEPENDENT_AMBULATORY_CARE_PROVIDER_SITE_OTHER): Payer: BC Managed Care – PPO

## 2021-06-11 ENCOUNTER — Other Ambulatory Visit: Payer: Self-pay

## 2021-06-11 DIAGNOSIS — E785 Hyperlipidemia, unspecified: Secondary | ICD-10-CM | POA: Diagnosis not present

## 2021-06-11 DIAGNOSIS — IMO0002 Reserved for concepts with insufficient information to code with codable children: Secondary | ICD-10-CM

## 2021-06-11 DIAGNOSIS — E1069 Type 1 diabetes mellitus with other specified complication: Secondary | ICD-10-CM

## 2021-06-11 DIAGNOSIS — E1065 Type 1 diabetes mellitus with hyperglycemia: Secondary | ICD-10-CM | POA: Diagnosis not present

## 2021-06-11 DIAGNOSIS — E1059 Type 1 diabetes mellitus with other circulatory complications: Secondary | ICD-10-CM

## 2021-06-11 LAB — COMPREHENSIVE METABOLIC PANEL
ALT: 13 U/L (ref 0–35)
AST: 19 U/L (ref 0–37)
Albumin: 3.9 g/dL (ref 3.5–5.2)
Alkaline Phosphatase: 99 U/L (ref 39–117)
BUN: 17 mg/dL (ref 6–23)
CO2: 28 mEq/L (ref 19–32)
Calcium: 9.2 mg/dL (ref 8.4–10.5)
Chloride: 102 mEq/L (ref 96–112)
Creatinine, Ser: 1.07 mg/dL (ref 0.40–1.20)
GFR: 57.34 mL/min — ABNORMAL LOW (ref >60.00)
Glucose, Bld: 249 mg/dL — ABNORMAL HIGH (ref 70–99)
Potassium: 5.1 mEq/L (ref 3.5–5.1)
Sodium: 137 mEq/L (ref 135–145)
Total Bilirubin: 0.4 mg/dL (ref 0.2–1.2)
Total Protein: 6.6 g/dL (ref 6.0–8.3)

## 2021-06-11 LAB — MICROALBUMIN / CREATININE URINE RATIO
Creatinine,U: 105.6 mg/dL
Microalb Creat Ratio: 1.6 mg/g (ref 0.0–30.0)
Microalb, Ur: 1.7 mg/dL (ref 0.0–1.9)

## 2021-06-11 LAB — LIPID PANEL
Cholesterol: 160 mg/dL (ref 0–200)
HDL: 50.3 mg/dL (ref >39.00)
LDL Cholesterol: 86 mg/dL (ref 0–99)
NonHDL: 110.16
Total CHOL/HDL Ratio: 3
Triglycerides: 121 mg/dL (ref 0.0–149.0)
VLDL: 24.2 mg/dL (ref 0.0–40.0)

## 2021-06-11 LAB — HEMOGLOBIN A1C: Hgb A1c MFr Bld: 7.9 % — ABNORMAL HIGH (ref 4.6–6.5)

## 2021-06-11 LAB — TSH: TSH: 2.85 u[IU]/mL (ref 0.35–5.50)

## 2021-06-15 ENCOUNTER — Encounter: Payer: Self-pay | Admitting: Family Medicine

## 2021-06-15 ENCOUNTER — Ambulatory Visit (INDEPENDENT_AMBULATORY_CARE_PROVIDER_SITE_OTHER): Payer: BC Managed Care – PPO | Admitting: Family Medicine

## 2021-06-15 ENCOUNTER — Other Ambulatory Visit: Payer: Self-pay

## 2021-06-15 VITALS — BP 134/72 | HR 71 | Temp 97.9°F | Ht 62.75 in | Wt 131.6 lb

## 2021-06-15 DIAGNOSIS — N289 Disorder of kidney and ureter, unspecified: Secondary | ICD-10-CM

## 2021-06-15 DIAGNOSIS — Z0000 Encounter for general adult medical examination without abnormal findings: Secondary | ICD-10-CM | POA: Diagnosis not present

## 2021-06-15 DIAGNOSIS — E1059 Type 1 diabetes mellitus with other circulatory complications: Secondary | ICD-10-CM

## 2021-06-15 DIAGNOSIS — Z23 Encounter for immunization: Secondary | ICD-10-CM | POA: Diagnosis not present

## 2021-06-15 DIAGNOSIS — N3941 Urge incontinence: Secondary | ICD-10-CM | POA: Diagnosis not present

## 2021-06-15 DIAGNOSIS — I25708 Atherosclerosis of coronary artery bypass graft(s), unspecified, with other forms of angina pectoris: Secondary | ICD-10-CM

## 2021-06-15 DIAGNOSIS — R3915 Urgency of urination: Secondary | ICD-10-CM

## 2021-06-15 DIAGNOSIS — E7800 Pure hypercholesterolemia, unspecified: Secondary | ICD-10-CM

## 2021-06-15 DIAGNOSIS — Z951 Presence of aortocoronary bypass graft: Secondary | ICD-10-CM

## 2021-06-15 DIAGNOSIS — E785 Hyperlipidemia, unspecified: Secondary | ICD-10-CM

## 2021-06-15 DIAGNOSIS — R0789 Other chest pain: Secondary | ICD-10-CM

## 2021-06-15 DIAGNOSIS — E1069 Type 1 diabetes mellitus with other specified complication: Secondary | ICD-10-CM

## 2021-06-15 DIAGNOSIS — IMO0002 Reserved for concepts with insufficient information to code with codable children: Secondary | ICD-10-CM

## 2021-06-15 DIAGNOSIS — I10 Essential (primary) hypertension: Secondary | ICD-10-CM

## 2021-06-15 DIAGNOSIS — E1065 Type 1 diabetes mellitus with hyperglycemia: Secondary | ICD-10-CM

## 2021-06-15 DIAGNOSIS — K3184 Gastroparesis: Secondary | ICD-10-CM

## 2021-06-15 DIAGNOSIS — E1043 Type 1 diabetes mellitus with diabetic autonomic (poly)neuropathy: Secondary | ICD-10-CM

## 2021-06-15 LAB — POC URINALSYSI DIPSTICK (AUTOMATED)
Bilirubin, UA: NEGATIVE
Blood, UA: NEGATIVE
Glucose, UA: NEGATIVE
Ketones, UA: NEGATIVE
Leukocytes, UA: NEGATIVE
Nitrite, UA: NEGATIVE
Protein, UA: NEGATIVE
Spec Grav, UA: 1.02 (ref 1.010–1.025)
Urobilinogen, UA: 0.2 E.U./dL
pH, UA: 5.5 (ref 5.0–8.0)

## 2021-06-15 NOTE — Patient Instructions (Addendum)
Urinalysis today for urgency.  Prevnar-20 today  Consider shingrix vaccine series.  We will request records from latest eye exam with [facility]) as well as [employer].  Increase water intake. Touch base with lipid clinic, we could recheck levels in 3-4 months (lab visit only).  Return in 1 year for next physical.   Health Maintenance for Postmenopausal Women Menopause is a normal process in which your ability to get pregnant comes to an end. This process happens slowly over many months or years, usually between the ages of 61 and 64. Menopause is complete when you have missed your menstrualperiods for 12 months. It is important to talk with your health care provider about some of the most common conditions that affect women after menopause (postmenopausal women). These include heart disease, cancer, and bone loss (osteoporosis). Adopting a healthy lifestyle and getting preventive care can help to promote your health and wellness. The actions you take can also lower your chances ofdeveloping some of these common conditions. What should I know about menopause? During menopause, you may get a number of symptoms, such as: Hot flashes. These can be moderate or severe. Night sweats. Decrease in sex drive. Mood swings. Headaches. Tiredness. Irritability. Memory problems. Insomnia. Choosing to treat or not to treat these symptoms is a decision that you makewith your health care provider. Do I need hormone replacement therapy? Hormone replacement therapy is effective in treating symptoms that are caused by menopause, such as hot flashes and night sweats. Hormone replacement carries certain risks, especially as you become older. If you are thinking about using estrogen or estrogen with progestin, discuss the benefits and risks with your health care provider. What is my risk for heart disease and stroke? The risk of heart disease, heart attack, and stroke increases  as you age. One of the causes may be a change in the body's hormones during menopause. This can affect how your body uses dietary fats, triglycerides, and cholesterol. Heart attack and stroke are medical emergencies. There are many things that you cando to help prevent heart disease and stroke. Watch your blood pressure High blood pressure causes heart disease and increases the risk of stroke. This is more likely to develop in people who have high blood pressure readings, are of African descent, or are overweight. Have your blood pressure checked: Every 3-5 years if you are 17-43 years of age. Every year if you are 27 years old or older. Eat a healthy diet  Eat a diet that includes plenty of vegetables, fruits, low-fat dairy products, and lean protein. Do not eat a lot of foods that are high in solid fats, added sugars, or sodium.  Get regular exercise Get regular exercise. This is one of the most important things you can do for your health. Most adults should: Try to exercise for at least 150 minutes each week. The exercise should increase your heart rate and make you sweat (moderate-intensity exercise). Try to do strengthening exercises at least twice each week. Do these in addition to the moderate-intensity exercise. Spend less time sitting. Even light physical activity can be beneficial. Other tips Work with your health care provider to achieve or maintain a healthy weight. Do not use any products that contain nicotine or tobacco, such as cigarettes, e-cigarettes, and chewing tobacco. If you need help quitting, ask your health care provider. Know your numbers. Ask your health care provider to check your cholesterol and your blood sugar (glucose). Continue to have your blood tested as  directed by your health care provider. Do I need screening for cancer? Depending on your health history and family history, you may need to have cancer screening at different stages of your life. This may  include screening for: Breast cancer. Cervical cancer. Lung cancer. Colorectal cancer. What is my risk for osteoporosis? After menopause, you may be at increased risk for osteoporosis. Osteoporosis is a condition in which bone destruction happens more quickly than new bone creation. To help prevent osteoporosis or the bone fractures that can happen because of osteoporosis, you may take the following actions: If you are 39-79 years old, get at least 1,000 mg of calcium and at least 600 mg of vitamin D per day. If you are older than age 66 but younger than age 75, get at least 1,200 mg of calcium and at least 600 mg of vitamin D per day. If you are older than age 57, get at least 1,200 mg of calcium and at least 800 mg of vitamin D per day. Smoking and drinking excessive alcohol increase the risk of osteoporosis. Eat foods that are rich in calcium and vitamin D, and do weight-bearing exercisesseveral times each week as directed by your health care provider. How does menopause affect my mental health? Depression may occur at any age, but it is more common as you become older. Common symptoms of depression include: Low or sad mood. Changes in sleep patterns. Changes in appetite or eating patterns. Feeling an overall lack of motivation or enjoyment of activities that you previously enjoyed. Frequent crying spells. Talk with your health care provider if you think that you are experiencingdepression. General instructions See your health care provider for regular wellness exams and vaccines. This may include: Scheduling regular health, dental, and eye exams. Getting and maintaining your vaccines. These include: Influenza vaccine. Get this vaccine each year before the flu season begins. Pneumonia vaccine. Shingles vaccine. Tetanus, diphtheria, and pertussis (Tdap) booster vaccine. Your health care provider may also recommend other immunizations. Tell your health care provider if you have ever been  abused or do not feel safeat home. Summary Menopause is a normal process in which your ability to get pregnant comes to an end. This condition causes hot flashes, night sweats, decreased interest in sex, mood swings, headaches, or lack of sleep. Treatment for this condition may include hormone replacement therapy. Take actions to keep yourself healthy, including exercising regularly, eating a healthy diet, watching your weight, and checking your blood pressure and blood sugar levels. Get screened for cancer and depression. Make sure that you are up to date with all your vaccines. This information is not intended to replace advice given to you by your health care provider. Make sure you discuss any questions you have with your healthcare provider. Document Revised: 10/14/2018 Document Reviewed: 10/14/2018 Elsevier Patient Education  2022 [employer].

## 2021-06-15 NOTE — Progress Notes (Signed)
Patient ID: Felicia [name], female    DOB: 03/14/1961, 60 y.o.   MRN: [id number]  This visit was conducted in person.  BP 134/72   Pulse 71   Temp 97.9 F (36.6 C) (Temporal)   Ht 5' 2.75" (1.594 m)   Wt 131 lb 9 oz (59.7 kg)   SpO2 100%   BMI 23.49 kg/m    CC: CPE Subjective:   HPI: Felicia [name] is a 60 y.o. female presenting on 06/15/2021 for Annual Exam   Preventative: COLONOSCOPY 07/2016 - WNL (Pyrtle) Breast cancer screening - L breast dx mammo and [place] reassuring 05/2021 Well woman exam - with Dr [facility] last seen Fall 2021 Lung cancer screen - not eligible  DEXA scan - done remotely (told osteopenia) may be due for repeat (followed by GYN)  Flu shot - yearly  COVID vaccine - Pfizer 02/2020, 05/2020, no booster  Tdap 08/2011  Pneumovax-23 2004. Prevnar-20 today Shingrix - discussed, to consider  Advanced directive discussion -  Seat belt use discussed Sunscreen use discussed. No changing moles on skin.  Non smoker  Alcohol - none  Dentist - q6 mo  Eye exam - sees Bulakowski regularly, also seeing retina specialist [facility])  Bowel - no constipation  Bladder - intermittent urge incontinence, not too bothersome at this time. 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 Interim medical history since our last visit reviewed. Allergies and medications reviewed and updated. Outpatient Medications Prior to Visit  Medication Sig Dispense Refill   isosorbide mononitrate (IMDUR) 30 MG 24 hr tablet Take 1 tablet (30 mg total) by mouth daily. 30 tablet 3   aspirin 81 MG EC tablet Take 1 tablet (81 mg total) by mouth daily.     Continuous Blood Gluc Sensor (DEXCOM G6 SENSOR) MISC Use as instructed to check blood sugar change every 10 days 9 each 0   Continuous Blood Gluc Transmit (DEXCOM G6  TRANSMITTER) MISC USE AS INSTRUCTED CHANGE EVERY 90 DAYS 1 each 0   esomeprazole (NEXIUM) 40 MG capsule Take 1 capsule (40 mg total) by mouth daily. Patient needs office visit for further refills 90 capsule 0   Evolocumab (REPATHA SURECLICK) 140 MG/ML SOAJ Inject 140 mg into the skin every 14 (fourteen) days. 6 mL 3   glucagon (GLUCAGON EMERGENCY) 1 MG injection Inject 1 mg into the muscle once as needed (in case of severe hypoglycemia). 1 each 12   Insulin Pen Needle (NOVOFINE PEN NEEDLE) 32G X 6 MM MISC USE TO INJECT INSULIN 4 TIMES A DAY 200 each 1   insulin regular (NOVOLIN R) 100 units/mL injection Injecting 5 to 20 units before meals 10 mL 5   Insulin Syringe-Needle U-100 (BD VEO INSULIN SYRINGE U/F) 31G X 15/64" 0.3 ML MISC Use to inject insulin 5-6 times a day 300 each 2   LANTUS SOLOSTAR 100 UNIT/ML Solostar Pen INJECT 12 UNITS INTO THE SKIN EVERY DAY 15 mL 1   Multiple Vitamin (MULTIVITAMIN) tablet Take 1 tablet by mouth daily.     nitroGLYCERIN (NITROSTAT) 0.4 MG SL tablet Place 1 tablet (0.4 mg total) under the tongue every 5 (five) minutes as needed for chest pain. 25 tablet 3   nirmatrelvir/ritonavir EUA (PAXLOVID) TABS Take 3 tablets by mouth 2 (two) times daily. Patient GFR is >60. Take nirmatrelvir (  150 mg) 2 tablet(s) twice daily for 5 days and ritonavir (100 mg) one tablet twice daily for 5 days. 30 tablet 0   No facility-administered medications prior to visit.     Per HPI unless specifically indicated in ROS section below Review of Systems  Constitutional:  Negative for activity change, appetite change, chills, fatigue, fever and unexpected weight change.  HENT:  Negative for hearing loss.   Eyes:  Negative for visual disturbance.  Respiratory:  Positive for chest tightness (intermittent). Negative for cough, shortness of breath and wheezing.   Cardiovascular:  Positive for leg swelling (chronic). Negative for chest pain and palpitations.  Gastrointestinal:  Positive for  constipation and nausea. Negative for abdominal distention, abdominal pain, blood in stool, diarrhea and vomiting.  Genitourinary:  Negative for difficulty urinating and hematuria.  Musculoskeletal:  Negative for arthralgias, myalgias and neck pain.  Skin:  Negative for rash.  Neurological:  Negative for dizziness, seizures, syncope and headaches.  Hematological:  Negative for adenopathy. Does not bruise/bleed easily.  Psychiatric/Behavioral:  Negative for dysphoric mood. The patient is not nervous/anxious.    Objective:  BP 134/72   Pulse 71   Temp 97.9 F (36.6 C) (Temporal)   Ht 5' 2.75" (1.594 m)   Wt 131 lb 9 oz (59.7 kg)   SpO2 100%   BMI 23.49 kg/m   Wt Readings from Last 3 Encounters:  06/15/21 131 lb 9 oz (59.7 kg)  11/30/20 132 lb 6.4 oz (60.1 kg)  11/23/20 132 lb (59.9 kg)      Physical Exam Vitals and nursing note reviewed.  Constitutional:      Appearance: Normal appearance. She is not ill-appearing.  HENT:     Head: Normocephalic and atraumatic.     Right Ear: Tympanic membrane, ear canal and external ear normal. There is no impacted cerumen.     Left Ear: Tympanic membrane, ear canal and external ear normal. There is no impacted cerumen.  Eyes:     General:        Right eye: No discharge.        Left eye: No discharge.     Extraocular Movements: Extraocular movements intact.     Conjunctiva/sclera: Conjunctivae normal.     Pupils: Pupils are equal, round, and reactive to light.  Neck:     Thyroid: No thyroid mass or thyromegaly.     Vascular: No carotid bruit.  Cardiovascular:     Rate and Rhythm: Normal rate and regular rhythm.     Pulses: Normal pulses.     Heart sounds: Normal heart sounds. No murmur heard. Pulmonary:     Effort: Pulmonary effort is normal. No respiratory distress.     Breath sounds: Normal breath sounds. No wheezing, rhonchi or rales.  Abdominal:     General: Bowel sounds are normal. There is no distension.     Palpations: Abdomen  is soft. There is no mass.     Tenderness: no abdominal tenderness There is no guarding or rebound.     Hernia: No hernia is present.  Musculoskeletal:     Cervical back: Normal range of motion and neck supple. No rigidity.     Right lower leg: No edema.     Left lower leg: No edema.  Lymphadenopathy:     Cervical: No cervical adenopathy.  Skin:    General: Skin is warm and dry.     Findings: No rash.  Neurological:     General: No focal deficit present.  Mental Status: She is alert. Mental status is at baseline.  Psychiatric:        Mood and Affect: Mood normal.        Behavior: Behavior normal.      Results for orders placed or performed in visit on 06/15/21  POCT Urinalysis Dipstick (Automated)  Result Value Ref Range   Color, UA yellow    Clarity, UA clear    Glucose, UA Negative Negative   Bilirubin, UA negative    Ketones, UA negative    Spec Grav, UA 1.020 1.010 - 1.025   Blood, UA negative    pH, UA 5.5 5.0 - 8.0   Protein, UA Negative Negative   Urobilinogen, UA 0.2 0.2 or 1.0 E.U./dL   Nitrite, UA negativie    Leukocytes, UA Negative Negative    Assessment & Plan:  This visit occurred during the SARS-CoV-2 public health emergency.  Safety protocols were in place, including screening questions prior to the visit, additional usage of staff PPE, and extensive cleaning of exam room while observing appropriate contact time as indicated for disinfecting solutions.   Problem List Items Addressed This Visit     Uncontrolled type 1 diabetes mellitus with circulatory complication, with long-term current use of insulin (HCC)    Followed by endo Elvera Lennox), considering pump      Hyperlipidemia due to type 1 diabetes mellitus (HCC)    Continues repatha. Recent LDL above goal, unclear why - she will reach out to lipid clinic. Discussed option to recheck cholesterol levels in 3-4 months.       Essential hypertension    Chronic, stable off antihypertensive.  Discussed  imdur use.       Diabetic gastroparesis associated with type 1 diabetes mellitus (HCC)    H/o this. Reglan didn't help.       Chest tightness    Intermittent, not exertion related.  Discussed imdur use - she is hesitant given prior experience with antihypertensives causing syncope with orbital/wrist fracture.       S/P CABG (coronary artery bypass graft)   CAD (coronary artery disease)    Continue to see cardiology regularly.       Health maintenance examination - Primary    Preventative protocols reviewed and updated unless pt declined. Discussed healthy diet and lifestyle.       Urge urinary incontinence    Mild symptoms, not currently bothersome enough to consider trial medication - will monitor. Check UA today.       Relevant Orders   POCT Urinalysis Dipstick (Automated) (Completed)   Renal insufficiency    Encouraged increased water intake. She avoids NSAIDs       Other Visit Diagnoses     Need for vaccination against Streptococcus pneumoniae       Relevant Orders   Pneumococcal conjugate vaccine 20-valent (Completed)        No orders of the defined types were placed in this encounter.  Orders Placed This Encounter  Procedures   Pneumococcal conjugate vaccine 20-valent   POCT Urinalysis Dipstick (Automated)    Patient instructions: Urinalysis today for urgency.  Prevnar-20 today  Consider shingrix vaccine series.  We will request records from latest eye exam with [facility]) as well as [employer].  Increase water intake. Touch base with lipid clinic, we could recheck levels in 3-4 months (lab visit only).  Return in 1 year for next physical.   Follow up plan: Return in about 1 year (around 06/15/2022) for annual  exam, prior fasting for blood work.  Ria Bush, MD

## 2021-06-16 DIAGNOSIS — N3941 Urge incontinence: Secondary | ICD-10-CM | POA: Insufficient documentation

## 2021-06-16 DIAGNOSIS — N289 Disorder of kidney and ureter, unspecified: Secondary | ICD-10-CM | POA: Insufficient documentation

## 2021-06-16 DIAGNOSIS — Z0000 Encounter for general adult medical examination without abnormal findings: Secondary | ICD-10-CM | POA: Insufficient documentation

## 2021-06-16 DIAGNOSIS — E1022 Type 1 diabetes mellitus with diabetic chronic kidney disease: Secondary | ICD-10-CM | POA: Insufficient documentation

## 2021-06-16 NOTE — Assessment & Plan Note (Addendum)
H/o this. Reglan didn't help.

## 2021-06-16 NOTE — Assessment & Plan Note (Signed)
Encouraged increased water intake. She avoids NSAIDs

## 2021-06-16 NOTE — Assessment & Plan Note (Signed)
Followed by endo Elvera Lennox), considering pump

## 2021-06-16 NOTE — Assessment & Plan Note (Signed)
Intermittent, not exertion related.  Discussed imdur use - she is hesitant given prior experience with antihypertensives causing syncope with orbital/wrist fracture.

## 2021-06-16 NOTE — Assessment & Plan Note (Signed)
Chronic, stable off antihypertensive.  Discussed imdur use.

## 2021-06-16 NOTE — Assessment & Plan Note (Signed)
Continues repatha. Recent LDL above goal, unclear why - she will reach out to lipid clinic. Discussed option to recheck cholesterol levels in 3-4 months.

## 2021-06-16 NOTE — Assessment & Plan Note (Signed)
Preventative protocols reviewed and updated unless pt declined. Discussed healthy diet and lifestyle.  

## 2021-06-16 NOTE — Assessment & Plan Note (Signed)
Continue to see cardiology regularly.

## 2021-06-16 NOTE — Assessment & Plan Note (Signed)
Mild symptoms, not currently bothersome enough to consider trial medication - will monitor. Check UA today.

## 2021-06-21 ENCOUNTER — Encounter: Payer: Self-pay | Admitting: Family Medicine

## 2021-06-27 MED ORDER — MONTELUKAST SODIUM 10 MG PO TABS: 10.0000 mg | ORAL_TABLET | Freq: Every day | ORAL | 1 refills | Status: DC

## 2021-06-28 MED ORDER — METOPROLOL SUCCINATE ER 25 MG PO TB24: 12.5000 mg | ORAL_TABLET | Freq: Every day | ORAL | 3 refills | Status: DC

## 2021-06-28 NOTE — Telephone Encounter (Signed)
Per Dr C:  Please Rx metoprolol succinate 12.5 mg daily, #90, RF 3 . New rx sent to the pharmacy as ordered.

## 2021-07-16 ENCOUNTER — Telehealth: Payer: Self-pay

## 2021-07-16 NOTE — Telephone Encounter (Signed)
Pt is calling for Dexcom sensor refills. [facility] hasn't received them. Call pt [phone].

## 2021-07-17 ENCOUNTER — Other Ambulatory Visit: Payer: Self-pay | Admitting: Internal Medicine

## 2021-07-17 DIAGNOSIS — E103293 Type 1 diabetes mellitus with mild nonproliferative diabetic retinopathy without macular edema, bilateral: Secondary | ICD-10-CM

## 2021-07-18 MED ORDER — DEXCOM G6 SENSOR MISC: 0 refills | Status: DC

## 2021-07-20 ENCOUNTER — Telehealth: Payer: Self-pay | Admitting: Family Medicine

## 2021-07-27 ENCOUNTER — Other Ambulatory Visit: Payer: Self-pay

## 2021-07-27 ENCOUNTER — Encounter: Payer: Self-pay | Admitting: Internal Medicine

## 2021-07-27 ENCOUNTER — Ambulatory Visit: Payer: BC Managed Care – PPO | Admitting: Internal Medicine

## 2021-07-27 VITALS — BP 120/70 | HR 60 | Ht 62.75 in | Wt 131.4 lb

## 2021-07-27 DIAGNOSIS — E785 Hyperlipidemia, unspecified: Secondary | ICD-10-CM

## 2021-07-27 DIAGNOSIS — E103293 Type 1 diabetes mellitus with mild nonproliferative diabetic retinopathy without macular edema, bilateral: Secondary | ICD-10-CM

## 2021-07-27 DIAGNOSIS — E1069 Type 1 diabetes mellitus with other specified complication: Secondary | ICD-10-CM | POA: Diagnosis not present

## 2021-07-27 NOTE — Patient Instructions (Addendum)
Please continue: - Lantus 4 units in am and 5-6 units in pm  Change: - R insulin - take the insulin at the end of the meal or try 30 min after the meal - try first just with lunch, then the other meals: - insulin to carb ratio (ICR)  B'fast: 5:1 (if going for a walk: 10:1) Lunch: 10:1  Dinner: 20:1  - target 150 - insulin sensitivity factor (ISF) 60: 150-200: + 1 unit 201-260: + 2 units 261-320: + 3 units 321-380: + 4 units >380: + 5 units Do not correct bedtime sugars <300, and only inject 1 unit then.  Try to check sugars with the glucometer while correcting low blood sugars.   Please return in 3-4 months with your sugar log.

## 2021-07-27 NOTE — Progress Notes (Signed)
Patient ID: Felicia [name], female   DOB: [birth year], 60 y.o.   MRN: [id number]  This visit occurred during the SARS-CoV-2 public health emergency.  Safety protocols were in place, including screening questions prior to the visit, additional usage of staff PPE, and extensive cleaning of exam room while observing appropriate contact time as indicated for disinfecting solutions.   HPI: Felicia [name] is a 60 y.o.-year-old female, presenting for follow-up for DM1, dx in 40 (age 36), uncontrolled, with complications (CAD - s/p 5v CABG, mild retinopathy, gastroparesis). Last visit 4 months ago (virtual).   Interim hx: No increased urination, blurry vision, nausea, chest pain. He is very bothered by the alarms on her sensor during the night.  She is not usually sleeping through the night because of these.  Reviewed HbA1c levels: Lab Results  Component Value Date   HGBA1C 7.9 (H) 06/11/2021   HGBA1C 7.5 (A) 11/30/2020   HGBA1C 7.0 (A) 07/28/2020   She was previously on insulin pump for 5 years (~2010) but she did not like it.  Sugars were not better on this.  She is not interested in getting back on the pump.    She is on: - Tresiba 12 units in am >> ... >> Lantus 8 in a.m. and 6 units at bedtime >> 6 units twice a day >> 4 units in am and 6 units in pm >> 4 units in am and 5 units at night >> 6 units around 3-4 am and 5 units at 3-4 pm >> 4 units in am and 6 units at night - R insulin: - insulin to carb ratio (ICR)  B'fast: 5:1 (if going for a walk: 10:1) Lunch: 10:1  Dinner: 20:1  - target 150 - insulin sensitivity factor (ISF) 60: 150-200: + 1 unit 201-260: + 2 units 261-320: + 3 units 321-380: + 4 units >380: + 5 units Do not correct bedtime sugars <300, and only inject 1 unit then.  For the snack at night, do not take more than 1-2 units of regular insulin!  We tried Metformin ER 1000 mg with dinner but stopped due to lack of effect. Tried [place] >> she did not feel that this  worked as well as Lantus. Tried Toujeo >> allergy: CP, SOB.  Meter: ReliOn  She checks her blood sugars more than 4 times a day with her Dexcom CGM:   Previously:   Lowest sugar was 38 >>... 45 >> 55; + hypoglycemia awareness in the 70s.  No recent hyper or hypoglycemia admissions.  She has a glucagon kit at home. Highest sugar was 500s (bagel)  ... >> 300s.   Pt's meals are: - Breakfast: protein bar + fruit >> granola bar (25 g carbs) - 10 am snack  - Lunch: apple + cheese, carrots - Snack bar: 15g carbs - Glucerna - Dinner: meat + 1/2 backed potato + veggies/salad - Snacks: popcorn, salty snacks: Potato chips  -No CKD, last BUN/creatinine:  Lab Results  Component Value Date   BUN 17 06/11/2021   CREATININE 1.07 06/11/2021  Previously on quinapril, now off.  + HL; last set of lipids: Lab Results  Component Value Date   CHOL 160 06/11/2021   HDL 50.30 06/11/2021   LDLCALC 86 06/11/2021   LDLDIRECT 120.0 06/17/2019   TRIG 121.0 06/11/2021   CHOLHDL 3 06/11/2021  She tried pravastatin, lovastatin, Zetia, even adding co-Q10 but she still had muscle cramps >>  now on Repatha, tolerated well. She also uses uses 2  creams: Theraworx + Muscle calm.    - last eye exam was on 09/17/2020: + DR  - no numbness and tingling in her feet.  On ASA 81.  Latest TSH is normal: Lab Results  Component Value Date   TSH 2.85 06/11/2021   She also has a history of HTN.  ROS: + See HPI  I reviewed pt's medications, allergies, PMH, social hx, family hx, and changes were documented in the history of present illness. Otherwise, unchanged from my initial visit note.  Past Medical History:  Diagnosis Date   Allergy    Anemia    past history long ago   Borderline HTN (hypertension)    CAD (coronary artery disease)    a. 02/2020 Cath: LM nl, LAD large, 90p, 31m D1 90p, LCX nl, OM 60-70p, RCA dominant w/ dampening upon engagement of ostium. 70p/m, RPDA 50. EF 55-65%.   COVID-19 virus  infection 02/2021   Disorder of kidney    has abd kidney (right)   GERD (gastroesophageal reflux disease)    History of diabetic gastroparesis    History of echocardiogram    a. 01/2013 Echo: EF 55-65%, no rwma. Mild MR. Nl RV fxn.   HLD (hyperlipidemia)    Hx: UTI (urinary tract infection)    IBS (irritable bowel syndrome)    Nephrolithiasis 2005   Osteopenia 2012   per pt   Systolic murmur 38/1191  mild mitral insuff   Type 1 diabetes mellitus with diabetic retinopathy without macular edema (HCC)    dx at 60y/o, background retinopathy   Past Surgical History:  Procedure Laterality Date   CESAREAN SECTION  1985   COLONOSCOPY  07/2016   WNL (Pyrtle)   CORONARY ARTERY BYPASS GRAFT N/A 02/21/2020   Procedure: CORONARY ARTERY BYPASS GRAFTING (CABG) times five using left internal mammary and right leg saphenous vein;  Surgeon: LLajuana Matte MD;  Location: [facility]  Service: Open Heart Surgery;  Laterality: N/A;   ESOPHAGOGASTRODUODENOSCOPY  07/2016   LA Grade C reflux esophagitis, concern for stasis/dysmotility (Pyrtle)   LAPAROSCOPIC APPENDECTOMY N/A 09/01/2015   Procedure: APPENDECTOMY LAPAROSCOPIC;  Surgeon: JJudeth Horn MD;  [facility]  Service: General;  Laterality: N/A;   LEFT HEART CATH AND CORONARY ANGIOGRAPHY N/A 02/17/2020   Procedure: LEFT HEART CATH AND CORONARY ANGIOGRAPHY;  Surgeon: GMinna Merritts MD;  [facility];  Service: Cardiovascular;  Laterality: N/A;   LEFT HEART CATH AND CORS/GRAFTS ANGIOGRAPHY N/A 02/29/2020   early failure of 4/5 bypass grafts - medically managed (Angelena Form CAnnita Brod MD)   ORIF FEMUR FRACTURE Left 1996   Corrected by patient in history 2016   PARTIAL HYSTERECTOMY  2000   paps by WErling ConteOB/GYN Tay[place]   TEE WITHOUT CARDIOVERSION N/A 02/21/2020   Procedure: TRANSESOPHAGEAL ECHOCARDIOGRAM (TEE);  Surgeon: LLajuana Matte MD;  [facility]  Service: Open Heart Surgery;  Laterality: N/A;    TONSILLECTOMY AND ADENOIDECTOMY  1975   TRIGGER FINGER RELEASE Left 12/2010   thumb   Social History   Socioeconomic History   Marital status: Married    Spouse name: Not on file   Number of children: 1   Years of education: 16   Highest education level: Bachelor's degree (e.g., BA, AB, BS)  Occupational History   Occupation: HR dMarketing executive OTHER    Comment: [employer]  Tobacco Use   Smoking status: Never   Smokeless tobacco: Never  Vaping Use  Vaping Use: Never used  Substance and Sexual Activity   Alcohol use: No    Alcohol/week: 0.0 standard drinks   Drug use: No   Sexual activity: Yes    Birth control/protection: None    Comment: Married  Other Topics Concern   Not on file  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  Has one daughter.  Education: college.      1 dog   Social Determinants of Radio broadcast assistant Strain: Not on file  Food Insecurity: Not on file  Transportation Needs: Not on file  Physical Activity: Not on file  Stress: Not on file  Social Connections: Not on file  Intimate Partner Violence: Not on file   Current Outpatient Medications on File Prior to Visit  Medication Sig Dispense Refill   aspirin 81 MG EC tablet Take 1 tablet (81 mg total) by mouth daily.     Continuous Blood Gluc Sensor (DEXCOM G6 SENSOR) MISC Use as instructed to check blood sugar change every 10 days 9 each 0   Continuous Blood Gluc Transmit (DEXCOM G6 TRANSMITTER) MISC USE AS INSTRUCTED CHANGE EVERY 90 DAYS 1 each 0   esomeprazole (NEXIUM) 40 MG capsule Take 1 capsule (40 mg total) by mouth daily. Patient needs office visit for further refills 90 capsule 0   Evolocumab (REPATHA SURECLICK) 007 MG/ML SOAJ Inject 140 mg into the skin every 14 (fourteen) days. 6 mL 3   glucagon (GLUCAGON EMERGENCY) 1 MG injection Inject 1 mg into the muscle once as needed (in case of severe  hypoglycemia). 1 each 12   Insulin Pen Needle (NOVOFINE PEN NEEDLE) 32G X 6 MM MISC USE TO INJECT INSULIN 4 TIMES A DAY 200 each 1   insulin regular (NOVOLIN R) 100 units/mL injection Injecting 5 to 20 units before meals 10 mL 5   Insulin Syringe-Needle U-100 (BD VEO INSULIN SYRINGE U/F) 31G X 15/64" 0.3 ML MISC Use to inject insulin 5-6 times a day 300 each 2   LANTUS SOLOSTAR 100 UNIT/ML Solostar Pen INJECT 12 UNITS INTO THE SKIN EVERY DAY 15 mL 1   metoprolol succinate (TOPROL XL) 25 MG 24 hr tablet Take 0.5 tablets (12.5 mg total) by mouth daily. 90 tablet 3   montelukast (SINGULAIR) 10 MG tablet TAKE 1 TABLET BY MOUTH EVERYDAY AT BEDTIME 30 tablet 2   Multiple Vitamin (MULTIVITAMIN) tablet Take 1 tablet by mouth daily.     nitroGLYCERIN (NITROSTAT) 0.4 MG SL tablet Place 1 tablet (0.4 mg total) under the tongue every 5 (five) minutes as needed for chest pain. 25 tablet 3   No current facility-administered medications on file prior to visit.   Allergies  Allergen Reactions   Protonix [Pantoprazole] Swelling    Facial Swelling   Toujeo Solostar [Insulin Glargine] Shortness Of Breath   Lactose Intolerance (Gi)     GI   Magnesium-Containing Compounds Swelling and Other (See Comments)    limbs   Adhesive [Tape] Rash and Other (See Comments)    Band aids   Family History  Problem Relation Age of Onset   Diabetes Maternal Grandmother    Heart failure Maternal Grandmother    COPD Maternal Grandmother        smoker   Thyroid disease Maternal Grandmother    Vascular Disease Maternal Grandmother    Colon cancer Maternal Grandfather    Colon polyps Mother  Breast cancer Other    CAD Brother 18       stent   Esophageal cancer Neg Hx    Rectal cancer Neg Hx    Stomach cancer Neg Hx    PE: BP 120/70 (BP Location: Right Arm, Patient Position: Sitting, Cuff Size: Normal)   Pulse 60   Ht 5' 2.75" (1.594 m)   Wt 131 lb 6.4 oz (59.6 kg)   SpO2 98%   BMI 23.46 kg/m  Body mass index  is 23.46 kg/m.  Wt Readings from Last 3 Encounters:  07/27/21 131 lb 6.4 oz (59.6 kg)  06/15/21 131 lb 9 oz (59.7 kg)  11/30/20 132 lb 6.4 oz (60.1 kg)   Constitutional: normal weight, in NAD Eyes: PERRLA, EOMI, no exophthalmos ENT: moist mucous membranes, no thyromegaly, no cervical lymphadenopathy Cardiovascular: RRR, No MRG Respiratory: CTA B Gastrointestinal: abdomen soft, NT, ND, BS+ Musculoskeletal: no deformities, strength intact in all 4 Skin: moist, warm, no rashes Neurological: no tremor with outstretched hands, DTR normal in all 4   ASSESSMENT: 1. DM1, uncontrolled, with complications - CAD - s/p 5v CABG - 02/21/2020 - Mild DR - Gastroparesis - This is the reason why she is on regular insulin, and she is taking it 10 minutes  rather than 30 minutes before eating  She had low CBGs with Novolog!  2. HL  PLAN:  1.  Patient with difficult to control type 1 diabetes, longstanding, on basal-bolus insulin regimen.  She was on an insulin pump in the past but did not like it and would not want to return to one.  She was initially reticent to start the CGM but she finally accepted to start a Dexcom CGM.  She is very insulin daily and use low doses of insulin for her.  Sugars are usually very variable, possibly also related to her gastroparesis, and we unfortunately are limited in what we can do with a basal-bolus insulin regimen to reduce variability.  At last visit, reviewing her CGM trends, sugars were improved in the previous 2 days.  They were higher during the night increasing after approximately 3 AM and they were mostly at target around 4-5 PM and then increasing after dinner.  I advised him to take less insulin for breakfast.  She was planning to walk in the morning and we also discussed about having her take 6 units of Lantus around 3-4 AM and 5 units around 3-4 AM (at her suggestion).  I advised her that if she does not wake up at night to give herself the insulin around his  diet to take 4 units of Lantus in the morning and increase the dose at bedtime to 6 units.  She ended up using this regimen. I again suggested an insulin pump at last visit but she did not want to retry it. -Since last visit, she had another HbA1c which was higher, at 7.9%, last month, increased from 7.5%. CGM interpretation: -At today's visit, we reviewed her CGM downloads: It appears that 64.5% of values are in target range (goal >70%), while 32.8% are higher than 180 (goal <25%), and 2.6% are lower than 70 (goal <4%).  The calculated average blood sugar is 161.  The projected HbA1c for the next 3 months (GMI) is 7.2%. -Reviewing the CGM trends, it appears that her sugars are dropping in the evening in the first part of the night and then increasing after approximately 3 AM, peaking around 7 AM.  Upon questioning, she appears to be  over correcting low blood sugars around 12-2 AM because she is doing the corrections based on sensor data, and not glucometer.  We discussed about the importance of checking sugars with a glucometer after correcting the low.  She is usually correcting with approximately 2 ounces of juice/soda, which is appropriate.  I did advise her that she may need to use a lower dose of Lantus in the evening if this continues to happen. -She also describes that part of the reasons that the sugars are high in the morning his exercise.  She is exercising on the treadmill and even if she starts with a good blood sugar, she has postexercise hyperglycemia in the 200s.  She then tries to correct it with regular insulin and then is dropping her blood sugars before lunch.  We discussed about exercise effect on blood sugars depending on the time of the day, the intensity of the exercise, and the type of exercise.  In her case, even if she does cardio, sugars do increase so one of the options would be to try to bolus insulin before exercise, rather than trying to correct postexercise hyperglycemia and  expose herself to lows afterwards. -Later in the day, sugars are fluctuating and they increase significantly in the late afternoon, before dinner.  Dinner is usually around 7 PM when sugars increased to 200 and even higher occasionally before the meal.  It is unclear why she has this effect.  She is not eating or drinking before dinner.  The only explanation I have would be a delayed effect of postlunch hyperglycemia due to her gastroparesis.  It is possible that her sugars are initially low after a meal since she is injecting regular insulin at the start of the meal and she is not eating carbs with the meal almost at all, and they increase afterwards beyond the effect of the regular insulin.  This would also explain her hyperglycemia in the second half of the night.  Therefore, we discussed about trying to inject regular insulin after she finishes the meal or even 30 minutes after the meal to see if this helps.  This is risky but she does have the CGM and it can alert her if the sugars are dropping.  I advised her to start with lunch and then possibly use this protocol for dinner, also.  She agrees to try it. -We also discussed about her diet.  I suggested to add healthy carbs and healthy fats which is trying to control her cholesterol level and was told to limit starches. -She is wondering whether other medications that are approved for type 2 diabetes may work for her.  I would not suggest a GLP-1 receptor agonist due to history of gastroparesis.  Also, because of her difficult to control type 1 diabetes and since she is trying to limit carbs and barely eats any carbs throughout the day, she would be a higher risk for DKA so SGLT2 inhibitors would not be the best option for her.  We tried metformin and this did not work for her. -At this visit, I again tried to convince her to start an insulin pump, at least to see how this would work for her.  She promises me that if her sugars are not better controlled at  next visit, she we will try a pump. - I suggested to:  Patient Instructions  Please continue: - Lantus 4 units in am and 5-6 units in pm  Change: - R insulin - take the insulin  at the end of the meal or try 30 min after the meal - try first just with lunch, then the other meals: - insulin to carb ratio (ICR)  B'fast: 5:1 (if going for a walk: 10:1) Lunch: 10:1  Dinner: 20:1  - target 150 - insulin sensitivity factor (ISF) 60: 150-200: + 1 unit 201-260: + 2 units 261-320: + 3 units 321-380: + 4 units >380: + 5 units Do not correct bedtime sugars <300, and only inject 1 unit then.  Try to check sugars with the glucometer while correcting low blood sugars.   Please return in 3-4 months.   - advised to check sugars at different times of the day - 4x a day, rotating check times - advised for yearly eye exams >> she is UTD - return to clinic in 3-4 months     2. HL -Reviewed latest lipid panel from 06/2021: LDL decreased, but still not at goal of less than 70, the rest of the fractions at goal: Lab Results  Component Value Date   CHOL 160 06/11/2021   HDL 50.30 06/11/2021   LDLCALC 86 06/11/2021   LDLDIRECT 120.0 06/17/2019   TRIG 121.0 06/11/2021   CHOLHDL 3 06/11/2021  -She continues on Repatha without side effects.  She tolerates it well.  Also, on fish oil.  Total time spent for the visit: 40 min, in assessing the adequacy of her insulin regimen, discussing her hypo- and hyper-glycemic episodes, reviewing previous labs, discussing possible reasons for blood sugar patterns, adjusting insulin doses and developing a plan to avoid hypo- and hyper-glycemia.  Please see above other issues addressed.  Philemon Kingdom, MD PhD [facility] Endocr[facility]

## 2021-07-30 ENCOUNTER — Encounter: Payer: Self-pay | Admitting: Family Medicine

## 2021-07-30 ENCOUNTER — Telehealth: Payer: Self-pay | Admitting: Family Medicine

## 2021-07-30 NOTE — Addendum Note (Signed)
Addended by: Nanci Pina on: 07/30/2021 01:11 PM   Modules accepted: Orders

## 2021-07-30 NOTE — Telephone Encounter (Signed)
Pt called in stating she received a call from her pharmacy stating this was filled. She was confused as she never requested a refill on this. Arline Asp brought me her information to investigate. I called patient to get clarification. I let her know that the pharmacy had sent over a refill request for her Singulair. She states she does not take this and is wondering if it can be removed from her medication list. Please advise.

## 2021-07-30 NOTE — Telephone Encounter (Signed)
Noted.  Med has been removed from list.

## 2021-07-30 NOTE — Telephone Encounter (Signed)
Pt called requesting Medical excuse for [employer] . Will send juror info in [place]  #   [phone]

## 2021-07-31 NOTE — Telephone Encounter (Signed)
See my chart note.

## 2021-08-01 ENCOUNTER — Telehealth: Payer: Self-pay | Admitting: Cardiovascular Disease

## 2021-08-01 NOTE — Telephone Encounter (Signed)
   [facility] Pre-operative Risk Assessment    Request for surgical clearance:  What type of surgery is being performed?   1 tooth extraction  When is this surgery scheduled?  TBD   What type of clearance is required (medical clearance vs. Pharmacy clearance to hold med vs. Both)?  Medical clearance    Are there any medications that need to be held prior to surgery and how long? No    Practice name and name of physician performing surgery?  [place] , DDS   What is your office phone number?   [phone].   What is your office fax number?  [phone].   Anesthesia type (None, local, MAC, general) ?  IV Sedation   Zara Council 08/01/2021, 9:02 AM

## 2021-08-01 NOTE — Telephone Encounter (Signed)
   Patient Name: Felicia [name]  DOB: September 17, 1961 MRN: [id number]  Primary Cardiologist: Thurmon Fair, MD  Chart reviewed as part of pre-operative protocol coverage.   Simple dental extractions are considered low risk procedures per guidelines and generally do not require any specific cardiac clearance. It is also generally accepted that for simple extractions and dental cleanings, there is no need to interrupt blood thinner therapy.  Given plans for IV sedation patient was contacted 08/01/21 for preop assessment. She reports doing well from a cardiac perspective. She can easily complete 4 METs without anginal complaints. Patient would be at acceptable risk for upcoming dental procedure without further cardiac work-up.  SBE prophylaxis is not required for the patient from a cardiac standpoint.  I will route this recommendation to the requesting party via Epic fax function and remove from pre-op pool.  Please call with questions.  Beatriz Stallion, PA-C 08/01/2021, 12:56 PM

## 2021-08-06 NOTE — Telephone Encounter (Signed)
Mailed letter to pt

## 2021-08-07 DIAGNOSIS — H43821 Vitreomacular adhesion, right eye: Secondary | ICD-10-CM | POA: Diagnosis not present

## 2021-08-07 DIAGNOSIS — E103311 Type 1 diabetes mellitus with moderate nonproliferative diabetic retinopathy with macular edema, right eye: Secondary | ICD-10-CM | POA: Diagnosis not present

## 2021-08-07 DIAGNOSIS — E103592 Type 1 diabetes mellitus with proliferative diabetic retinopathy without macular edema, left eye: Secondary | ICD-10-CM | POA: Diagnosis not present

## 2021-08-07 DIAGNOSIS — H35372 Puckering of macula, left eye: Secondary | ICD-10-CM | POA: Diagnosis not present

## 2021-09-17 ENCOUNTER — Other Ambulatory Visit: Payer: Self-pay | Admitting: Family Medicine

## 2021-09-17 DIAGNOSIS — N289 Disorder of kidney and ureter, unspecified: Secondary | ICD-10-CM

## 2021-09-17 DIAGNOSIS — E785 Hyperlipidemia, unspecified: Secondary | ICD-10-CM

## 2021-09-17 DIAGNOSIS — E1059 Type 1 diabetes mellitus with other circulatory complications: Secondary | ICD-10-CM

## 2021-09-17 NOTE — Addendum Note (Signed)
Addended by: [name] on: 09/17/2021 09:40 AM   Modules accepted: Orders

## 2021-09-19 ENCOUNTER — Other Ambulatory Visit: Payer: BC Managed Care – PPO

## 2021-10-05 ENCOUNTER — Other Ambulatory Visit: Payer: Self-pay

## 2021-10-05 ENCOUNTER — Other Ambulatory Visit (INDEPENDENT_AMBULATORY_CARE_PROVIDER_SITE_OTHER): Payer: BC Managed Care – PPO

## 2021-10-05 DIAGNOSIS — E1059 Type 1 diabetes mellitus with other circulatory complications: Secondary | ICD-10-CM | POA: Diagnosis not present

## 2021-10-05 DIAGNOSIS — E785 Hyperlipidemia, unspecified: Secondary | ICD-10-CM | POA: Diagnosis not present

## 2021-10-05 DIAGNOSIS — E1069 Type 1 diabetes mellitus with other specified complication: Secondary | ICD-10-CM

## 2021-10-05 DIAGNOSIS — N289 Disorder of kidney and ureter, unspecified: Secondary | ICD-10-CM | POA: Diagnosis not present

## 2021-10-05 LAB — CBC WITH DIFFERENTIAL/PLATELET
Basophils Absolute: 0 10*3/uL (ref 0.0–0.1)
Basophils Relative: 1 % (ref 0.0–3.0)
Eosinophils Absolute: 0.2 10*3/uL (ref 0.0–0.7)
Eosinophils Relative: 4.4 % (ref 0.0–5.0)
HCT: 37.8 % (ref 36.0–46.0)
Hemoglobin: 12.4 g/dL (ref 12.0–15.0)
Lymphocytes Relative: 29.7 % (ref 12.0–46.0)
Lymphs Abs: 1.4 10*3/uL (ref 0.7–4.0)
MCHC: 32.9 g/dL (ref 30.0–36.0)
MCV: 90.6 fl (ref 78.0–100.0)
Monocytes Absolute: 0.4 10*3/uL (ref 0.1–1.0)
Monocytes Relative: 8.3 % (ref 3.0–12.0)
Neutro Abs: 2.6 10*3/uL (ref 1.4–7.7)
Neutrophils Relative %: 56.6 % (ref 43.0–77.0)
Platelets: 268 10*3/uL (ref 150.0–400.0)
RBC: 4.17 Mil/uL (ref 3.87–5.11)
RDW: 13.7 % (ref 11.5–15.5)
WBC: 4.6 10*3/uL (ref 4.0–10.5)

## 2021-10-05 LAB — RENAL FUNCTION PANEL
Albumin: 3.9 g/dL (ref 3.5–5.2)
BUN: 14 mg/dL (ref 6–23)
CO2: 27 mEq/L (ref 19–32)
Calcium: 9 mg/dL (ref 8.4–10.5)
Chloride: 103 mEq/L (ref 96–112)
Creatinine, Ser: 0.99 mg/dL (ref 0.40–1.20)
GFR: 62.8 mL/min (ref >60.00)
Glucose, Bld: 197 mg/dL — ABNORMAL HIGH (ref 70–99)
Phosphorus: 3.8 mg/dL (ref 2.3–4.6)
Potassium: 4.5 mEq/L (ref 3.5–5.1)
Sodium: 139 mEq/L (ref 135–145)

## 2021-10-05 LAB — LIPID PANEL
Cholesterol: 132 mg/dL (ref 0–200)
HDL: 46 mg/dL (ref >39.00)
LDL Cholesterol: 49 mg/dL (ref 0–99)
NonHDL: 85.88
Total CHOL/HDL Ratio: 3
Triglycerides: 186 mg/dL — ABNORMAL HIGH (ref 0.0–149.0)
VLDL: 37.2 mg/dL (ref 0.0–40.0)

## 2021-10-05 LAB — HEMOGLOBIN A1C: Hgb A1c MFr Bld: 7.6 % — ABNORMAL HIGH (ref 4.6–6.5)

## 2021-10-05 LAB — VITAMIN D 25 HYDROXY (VIT D DEFICIENCY, FRACTURES): VITD: 39.11 ng/mL (ref 30.00–100.00)

## 2021-10-08 ENCOUNTER — Other Ambulatory Visit: Payer: Self-pay | Admitting: Internal Medicine

## 2021-10-08 DIAGNOSIS — E103293 Type 1 diabetes mellitus with mild nonproliferative diabetic retinopathy without macular edema, bilateral: Secondary | ICD-10-CM

## 2021-10-10 ENCOUNTER — Encounter: Payer: Self-pay | Admitting: Family Medicine

## 2021-10-11 ENCOUNTER — Telehealth: Payer: Self-pay | Admitting: *Deleted

## 2021-10-11 NOTE — Telephone Encounter (Signed)
Received a voicemail from [facility] stating that they need a verbal ok to replace a lost / damaged order for Repatha.  Their call back # is [phone] Reference # [id number]

## 2021-10-11 NOTE — Telephone Encounter (Signed)
Provided verbal order to replace Repatha pen.

## 2021-11-09 ENCOUNTER — Telehealth: Payer: Self-pay | Admitting: Internal Medicine

## 2021-11-09 DIAGNOSIS — E103293 Type 1 diabetes mellitus with mild nonproliferative diabetic retinopathy without macular edema, bilateral: Secondary | ICD-10-CM

## 2021-11-09 MED ORDER — "TECHLITE INSULIN SYRINGE 31G X 15/64"" 0.3 ML MISC": 1 refills | Status: DC

## 2021-11-09 NOTE — Telephone Encounter (Signed)
Rx sent to preferred pharmacy.

## 2021-11-09 NOTE — Telephone Encounter (Signed)
MEDICATION: Tech-Lite Syringes  PHARMACY:   [id number] [name], [name] Phone:  [phone]  Fax:  [phone]      HAS THE PATIENT CONTACTED THEIR PHARMACY?  Yes-requires new RX  IS THIS A 90 DAY SUPPLY : Yes  IS PATIENT OUT OF MEDICATION: No  IF NOT; HOW MUCH IS LEFT: Approx. 2-3 weeks  LAST APPOINTMENT DATE: @12 /03/2021  NEXT APPOINTMENT DATE:@1 /27/2023  DO WE HAVE YOUR PERMISSION TO LEAVE A DETAILED MESSAGE?: Yes  OTHER COMMENTS: Patient states the [name] listed above told Patient that they sent a request to [name] on 11/06/21 for the above with no response   **Let patient know to contact pharmacy at the end of the day to make sure medication is ready. **  ** Please notify patient to allow 48-72 hours to process**  **Encourage patient to contact the pharmacy for refills or they can request refills through [name]**

## 2021-11-30 ENCOUNTER — Encounter: Payer: Self-pay | Admitting: Internal Medicine

## 2021-11-30 ENCOUNTER — Ambulatory Visit (INDEPENDENT_AMBULATORY_CARE_PROVIDER_SITE_OTHER): Payer: BC Managed Care – PPO | Admitting: Internal Medicine

## 2021-11-30 ENCOUNTER — Other Ambulatory Visit: Payer: Self-pay

## 2021-11-30 ENCOUNTER — Other Ambulatory Visit: Payer: Self-pay | Admitting: Internal Medicine

## 2021-11-30 VITALS — BP 120/78 | HR 66 | Ht 62.75 in | Wt 132.6 lb

## 2021-11-30 DIAGNOSIS — E1069 Type 1 diabetes mellitus with other specified complication: Secondary | ICD-10-CM | POA: Diagnosis not present

## 2021-11-30 DIAGNOSIS — E103293 Type 1 diabetes mellitus with mild nonproliferative diabetic retinopathy without macular edema, bilateral: Secondary | ICD-10-CM | POA: Diagnosis not present

## 2021-11-30 DIAGNOSIS — E785 Hyperlipidemia, unspecified: Secondary | ICD-10-CM | POA: Diagnosis not present

## 2021-11-30 LAB — POCT GLYCOSYLATED HEMOGLOBIN (HGB A1C): Hemoglobin A1C: 6.9 % — AB (ref 4.0–5.6)

## 2021-11-30 MED ORDER — OMNIPOD 5 DEXG7G6 PODS GEN 5 MISC: 1.0000 | 3 refills | Status: DC

## 2021-11-30 MED ORDER — OMNIPOD 5 DEXG7G6 INTRO GEN 5 KIT: 1.0000 | PACK | 0 refills | Status: DC | PRN

## 2021-11-30 NOTE — Patient Instructions (Addendum)
Please increase: - Lantus 7-8 units in pm  Continue: - R insulin: - insulin to carb ratio (ICR)  B'fast: 5:1 (if going for a walk: 10:1) Lunch: 10:1  Dinner: 20:1  - target 150 - insulin sensitivity factor (ISF) 60: 150-200: + 1 unit 201-260: + 2 units 261-320: + 3 units 321-380: + 4 units >380: + 5 units Do not correct bedtime sugars <300, and only inject 1 unit then.  Try to add: - Farxiga 5 mg before b'fast  Try to see if if you can start the Omnipod 5 pump.  Please return in 3-4 months.

## 2021-11-30 NOTE — Progress Notes (Signed)
Patient ID: Felicia [name], female   DOB: [birth year], 61 y.o.   MRN: [id number]  This visit occurred during the SARS-CoV-2 public health emergency.  Safety protocols were in place, including screening questions prior to the visit, additional usage of staff PPE, and extensive cleaning of exam room while observing appropriate contact time as indicated for disinfecting solutions.   HPI: Felicia [name] is a 61 y.o.-year-old female, presenting for follow-up for DM1, dx in 25 (age 89), uncontrolled, with complications (CAD - s/p 5v CABG, mild retinopathy, gastroparesis). Last visit 4 months ago.  Interim hx: No increased urination, blurry vision, nausea, chest pain. He is still bothered by the alarms on her sensor during the night.  She is not usually sleeping through the night because of these.  Reviewed HbA1c levels: Lab Results  Component Value Date   HGBA1C 7.6 (H) 10/05/2021   HGBA1C 7.9 (H) 06/11/2021   HGBA1C 7.5 (A) 11/30/2020   She was previously on insulin pump for 5 years (~2010) but she did not like it.  Sugars were not better on this.  She is not interested in getting back on the pump.    She is on: - Tresiba 12 units in am >> ... >> Lantus 4 units in am and 5-6 units in pm >> 6 units at night - R insulin - take the insulin at the end of the meal or try 30 min after the meal - try first just with lunch, then the other meals - insulin to carb ratio (ICR)  B'fast: 5:1 (if going for a walk: 10:1) Lunch: 10:1  Dinner: 20:1  - target 150 - insulin sensitivity factor (ISF) 60: 150-200: + 1 unit 201-260: + 2 units 261-320: + 3 units 321-380: + 4 units >380: + 5 units Do not correct bedtime sugars <300, and only inject 1 unit then. Do not correct bedtime sugars <300, and only inject 1 unit then.  For the snack at night, do not take more than 1-2 units of regular insulin!  We tried Metformin ER 1000 mg with dinner but stopped due to lack of effect. Tried [place] >> she did not  feel that this worked as well as Lantus. Tried Toujeo >> allergy: CP, SOB.  Meter: ReliOn  She checks her blood sugars more than 4 times a day with her Dexcom CGM:   Previously:   Previously:   Lowest sugar was 38 >>... 45 >> 55; + hypoglycemia awareness in the 70s.  No recent hyper or hypoglycemia admissions.  She has a glucagon kit at home. Highest sugar was 500s (bagel)  ... >> 300s.   Pt's meals are: - Breakfast: protein bar + fruit >> granola bar (25 g carbs) - 10 am snack  - Lunch: apple + cheese, carrots - Snack bar: 15g carbs - Glucerna - Dinner: meat + 1/2 backed potato + veggies/salad - Snacks: popcorn, salty snacks: Potato chips  -No CKD, last BUN/creatinine:  Lab Results  Component Value Date   BUN 14 10/05/2021   CREATININE 0.99 10/05/2021  Previously on quinapril, now off.  + HL; last set of lipids: Lab Results  Component Value Date   CHOL 132 10/05/2021   HDL 46.00 10/05/2021   LDLCALC 49 10/05/2021   LDLDIRECT 120.0 06/17/2019   TRIG 186.0 (H) 10/05/2021   CHOLHDL 3 10/05/2021  She tried pravastatin, lovastatin, Zetia, even adding co-Q10 but she still had muscle cramps >>  now on Repatha, tolerated well. She also uses uses 2 creams:  Theraworx + Muscle calm.    - last eye exam was on 09/17/2021: + DR  - no numbness and tingling in her feet.  On ASA 81.  Latest TSH is normal: Lab Results  Component Value Date   TSH 2.85 06/11/2021   She also has a history of HTN.  ROS: + See HPI  I reviewed pt's medications, allergies, PMH, social hx, family hx, and changes were documented in the history of present illness. Otherwise, unchanged from my initial visit note.  Past Medical History:  Diagnosis Date   Allergy    Anemia    past history long ago   Borderline HTN (hypertension)    CAD (coronary artery disease)    a. 02/2020 Cath: LM nl, LAD large, 90p, 35m D1 90p, LCX nl, OM 60-70p, RCA dominant w/ dampening upon engagement of ostium. 70p/m,  RPDA 50. EF 55-65%.   COVID-19 virus infection 02/2021   Disorder of kidney    has abd kidney (right)   GERD (gastroesophageal reflux disease)    History of diabetic gastroparesis    History of echocardiogram    a. 01/2013 Echo: EF 55-65%, no rwma. Mild MR. Nl RV fxn.   HLD (hyperlipidemia)    Hx: UTI (urinary tract infection)    IBS (irritable bowel syndrome)    Nephrolithiasis 2005   Osteopenia 2012   per pt   Systolic murmur 32/9924  mild mitral insuff   Type 1 diabetes mellitus with diabetic retinopathy without macular edema (HCC)    dx at 61y/o, background retinopathy   Past Surgical History:  Procedure Laterality Date   CESAREAN SECTION  1985   COLONOSCOPY  07/2016   WNL (Pyrtle)   CORONARY ARTERY BYPASS GRAFT N/A 02/21/2020   Procedure: CORONARY ARTERY BYPASS GRAFTING (CABG) times five using left internal mammary and right leg saphenous vein;  Surgeon: LLajuana Matte MD;  Location[facility]  Service: Open Heart Surgery;  Laterality: N/A;   ESOPHAGOGASTRODUODENOSCOPY  07/2016   LA Grade C reflux esophagitis, concern for stasis/dysmotility (Pyrtle)   LAPAROSCOPIC APPENDECTOMY N/A 09/01/2015   Procedure: APPENDECTOMY LAPAROSCOPIC;  Surgeon: JJudeth Horn MD;  [facility]  Service: General;  Laterality: N/A;   LEFT HEART CATH AND CORONARY ANGIOGRAPHY N/A 02/17/2020   Procedure: LEFT HEART CATH AND CORONARY ANGIOGRAPHY;  Surgeon: GMinna Merritts MD;  [facility];  Service: Cardiovascular;  Laterality: N/A;   LEFT HEART CATH AND CORS/GRAFTS ANGIOGRAPHY N/A 02/29/2020   early failure of 4/5 bypass grafts - medically managed (Angelena Form CAnnita Brod MD)   ORIF FEMUR FRACTURE Left 1996   Corrected by patient in history 2016   PARTIAL HYSTERECTOMY  2000   paps by WErling ConteOB/GYN [place]   TEE WITHOUT CARDIOVERSION N/A 02/21/2020   Procedure: TRANSESOPHAGEAL ECHOCARDIOGRAM (TEE);  Surgeon: LLajuana Matte MD;  Location: [facility]  Service: Open  Heart Surgery;  Laterality: N/A;   TONSILLECTOMY AND ADENOIDECTOMY  1975   TRIGGER FINGER RELEASE Left 12/2010   thumb   Social History   Socioeconomic History   Marital status: Married    Spouse name: Not on file   Number of children: 1   Years of education: 16   Highest education level: Bachelor's degree (e.g., BA, AB, BS)  Occupational History   Occupation: HR dMarketing executive OTHER    Comment: [employer]  Tobacco Use   Smoking status: Never   Smokeless tobacco: Never  Vaping Use   Vaping  Use: Never used  Substance and Sexual Activity   Alcohol use: No    Alcohol/week: 0.0 standard drinks   Drug use: No   Sexual activity: Yes    Birth control/protection: None    Comment: Married  Other Topics Concern   Not on file  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  Has one daughter.  Education: college.      1 dog   Social Determinants of Radio broadcast assistant Strain: Not on file  Food Insecurity: Not on file  Transportation Needs: Not on file  Physical Activity: Not on file  Stress: Not on file  Social Connections: Not on file  Intimate Partner Violence: Not on file   Current Outpatient Medications on File Prior to Visit  Medication Sig Dispense Refill   aspirin 81 MG EC tablet Take 1 tablet (81 mg total) by mouth daily.     Continuous Blood Gluc Sensor (DEXCOM G6 SENSOR) MISC USE AS INSTRUCTED TO CHECK BLOOD SUGAR CHANGE EVERY 10 DAYS 9 each 1   Continuous Blood Gluc Transmit (DEXCOM G6 TRANSMITTER) MISC USE AS INSTRUCTED CHANGE EVERY 90 DAYS 1 each 1   esomeprazole (NEXIUM) 40 MG capsule Take 1 capsule (40 mg total) by mouth daily. Patient needs office visit for further refills 90 capsule 0   Evolocumab (REPATHA SURECLICK) 211 MG/ML SOAJ Inject 140 mg into the skin every 14 (fourteen) days. 6 mL 3   glucagon (GLUCAGON EMERGENCY) 1 MG injection Inject 1 mg into the muscle  once as needed (in case of severe hypoglycemia). 1 each 12   Insulin Pen Needle (NOVOFINE PEN NEEDLE) 32G X 6 MM MISC USE TO INJECT INSULIN 4 TIMES A DAY 200 each 1   insulin regular (NOVOLIN R) 100 units/mL injection INJECT 6 UNITS TOTAL INTO THE SKIN 3 (THREE) TIMES DAILY BEFORE MEALS. 30 mL 4   Insulin Syringe-Needle U-100 (TECHLITE INSULIN SYRINGE) 31G X 15/64" 0.3 ML MISC Use up to 5 times daily to inject insulin daily 300 each 1   LANTUS SOLOSTAR 100 UNIT/ML Solostar Pen INJECT 12 UNITS INTO THE SKIN EVERY DAY 15 mL 1   metoprolol succinate (TOPROL XL) 25 MG 24 hr tablet Take 0.5 tablets (12.5 mg total) by mouth daily. 90 tablet 3   Multiple Vitamin (MULTIVITAMIN) tablet Take 1 tablet by mouth daily.     nitroGLYCERIN (NITROSTAT) 0.4 MG SL tablet Place 1 tablet (0.4 mg total) under the tongue every 5 (five) minutes as needed for chest pain. 25 tablet 3   No current facility-administered medications on file prior to visit.   Allergies  Allergen Reactions   Protonix [Pantoprazole] Swelling    Facial Swelling   Toujeo Solostar [Insulin Glargine] Shortness Of Breath   Lactose Intolerance (Gi)     GI   Magnesium-Containing Compounds Swelling and Other (See Comments)    limbs   Adhesive [Tape] Rash and Other (See Comments)    Band aids   Family History  Problem Relation Age of Onset   Diabetes Maternal Grandmother    Heart failure Maternal Grandmother    COPD Maternal Grandmother        smoker   Thyroid disease Maternal Grandmother    Vascular Disease Maternal Grandmother    Colon cancer Maternal Grandfather    Colon polyps Mother    Breast cancer Other    CAD Brother 28  stent   Esophageal cancer Neg Hx    Rectal cancer Neg Hx    Stomach cancer Neg Hx    PE: BP 120/78 (BP Location: Right Arm, Patient Position: Sitting, Cuff Size: Normal)    Pulse 66    Ht 5' 2.75" (1.594 m)    Wt 132 lb 9.6 oz (60.1 kg)    SpO2 99%    BMI 23.68 kg/m    Wt Readings from Last 3  Encounters:  11/30/21 132 lb 9.6 oz (60.1 kg)  07/27/21 131 lb 6.4 oz (59.6 kg)  06/15/21 131 lb 9 oz (59.7 kg)   Constitutional: normal weight, in NAD Eyes: PERRLA, EOMI, no exophthalmos ENT: moist mucous membranes, no thyromegaly, no cervical lymphadenopathy Cardiovascular: RRR, No MRG Respiratory: CTA B Musculoskeletal: no deformities, strength intact in all 4 Skin: moist, warm, no rashes Neurological: no tremor with outstretched hands, DTR normal in all 4 Diabetic Foot Exam - Simple   Simple Foot Form Diabetic Foot exam was performed with the following findings: Yes 11/30/2021  4:00 PM  Visual Inspection No deformities, no ulcerations, no other skin breakdown bilaterally: Yes Sensation Testing Intact to touch and monofilament testing bilaterally: Yes See comments: Yes Pulse Check Posterior Tibialis and Dorsalis pulse intact bilaterally: Yes Comments Hypersensitivity to monofilament touch Onychodystrophy bilateral hallux toenails     ASSESSMENT: 1. DM1, uncontrolled, with complications - CAD - s/p 5v CABG - 02/21/2020 - Mild DR - Gastroparesis - This is the reason why she is on regular insulin, and she is taking it 10 minutes  rather than 30 minutes before eating  She had low CBGs with Novolog!  2. HL  PLAN:  1.  Patient with difficult to control type 1 diabetes, longstanding, basal-bolus insulin regimen.  She was previously on an insulin pump but did not like it and despite multiple discussions, she did not want to restart the pump, despite advances in the field.  She is using low insulin doses daily and we tried different combinations of the doses, but sugars remain very variable.  This is possibly related to her gastroparesis and unfortunately we reached the limit of what we can do with a basal-bolus insulin regimen to reduce variability.  At last visit, HbA1c was 7.9% but she had another HbA1c obtained almost 2 months ago, and this was slightly lower, at 7.6%. -At last  visit, I advised her to try to take the regular insulin at the end of the meal for 30 minutes after the meal due to the fact that she has gastroparesis and her stomach emptying is delayed.  We also discussed about her diet and I suggested to add healthy carbs and healthy fats to try to control cholesterol and also improve diabetes control.  We reviewed other medications for diabetes.  I did not suggest a GLP-1 receptor agonist due to the history of gastroparesis and also did not recommend an SGLT2 inhibitor as she does have type 1 diabetes, and eats a low-carb diet.  However, we can revisit this decision in the future.  I strongly advised her at the last visit to retry an insulin pump. CGM interpretation: -At today's visit, we reviewed her CGM downloads: It appears that 56.3% of values are in target range (goal >70%), while 41.6% are higher than 180 (goal <25%), and 2.2% are lower than 70 (goal <4%).  The calculated average blood sugar is 175.  The projected HbA1c for the next 3 months (GMI) is 7.5%. -Reviewing the CGM trends, it appears  that her sugars are increasing significantly after approximately 12-1 AM, with hyperglycemic peaks at approximately 4 AM and then another peak at 8 AM.  After this, sugars start to decrease and they are only slightly higher after lunch and at goal after dinner. -Therefore, at this visit, I suggested to try to increase the Lantus at that time.  At last visit she was taking Lantus twice a day, but she stopped the morning dose if she did not see an increase in morning blood sugars. -Since last visit she tried to move the regular insulin at the end of the meal for 30 minutes after the meal but she did not feel that this made a difference.  She is not taking it before meals.  We can continue with this. -To improve the sugars throughout the day and reduce hyperglycemic peaks, especially since she does have a history of heart disease, at this visit we did discuss about adding an  SGLT2 inhibitor.  She is adding some more carbs to her diet compared to last visit.  We discussed about benefits and possible side effects from the medication.  I did advise her to stay well-hydrated while on the medication.  Discussed about the possibility of euglycemic DKA while on this.  She agrees to try it. -We again discussed about an insulin pump.  She would be open to the idea of an OmniPod after I explained how this works and how it integrates with the Dexcom CGM.  She is attracted by the idea that this does not happen daily.  I sent the prescription for the OmniPod 5 to her pharmacy.  I advised her to let me know if she is able to get this, in which case, she will need to come in for pump training with the diabetes educator. Patient Instructions  Please increase: - Lantus 7-8 units in pm  Continue: - R insulin: - insulin to carb ratio (ICR)  B'fast: 5:1 (if going for a walk: 10:1) Lunch: 10:1  Dinner: 20:1  - target 150 - insulin sensitivity factor (ISF) 60: 150-200: + 1 unit 201-260: + 2 units 261-320: + 3 units 321-380: + 4 units >380: + 5 units Do not correct bedtime sugars <300, and only inject 1 unit then.  Try to add: - Farxiga 5 mg before b'fast  Try to see if if you can start the Omnipod 5 pump.  Please return in 3-4 months.   - we checked her HbA1c: 6.9% (lower, but this is not explained by her sugars at home). - advised to check sugars at different times of the day - 4x a day, rotating check times - advised for yearly eye exams >> she is UTD - Foot exam performed today - return to clinic in 3-4 months     2. HL -Reviewed latest lipid panel from 10/2021: LDL now at goal, triglycerides high: Lab Results  Component Value Date   CHOL 132 10/05/2021   HDL 46.00 10/05/2021   LDLCALC 49 10/05/2021   LDLDIRECT 120.0 06/17/2019   TRIG 186.0 (H) 10/05/2021   CHOLHDL 3 10/05/2021  -She continues on Repatha without side effects.  She tolerates this well.  She also  takes fish oil.  Philemon Kingdom, MD PhD [facility] Endocrinology

## 2021-12-12 ENCOUNTER — Other Ambulatory Visit: Payer: Self-pay

## 2021-12-12 ENCOUNTER — Encounter: Payer: Self-pay | Admitting: Cardiovascular Disease

## 2021-12-12 ENCOUNTER — Ambulatory Visit (INDEPENDENT_AMBULATORY_CARE_PROVIDER_SITE_OTHER): Payer: BC Managed Care – PPO | Admitting: Cardiovascular Disease

## 2021-12-12 VITALS — BP 140/86 | HR 67 | Ht 64.0 in | Wt 134.0 lb

## 2021-12-12 DIAGNOSIS — I25708 Atherosclerosis of coronary artery bypass graft(s), unspecified, with other forms of angina pectoris: Secondary | ICD-10-CM | POA: Diagnosis not present

## 2021-12-12 DIAGNOSIS — E108 Type 1 diabetes mellitus with unspecified complications: Secondary | ICD-10-CM

## 2021-12-12 DIAGNOSIS — E782 Mixed hyperlipidemia: Secondary | ICD-10-CM

## 2021-12-12 DIAGNOSIS — I25810 Atherosclerosis of coronary artery bypass graft(s) without angina pectoris: Secondary | ICD-10-CM

## 2021-12-12 MED ORDER — METOPROLOL SUCCINATE ER 25 MG PO TB24: 25.0000 mg | ORAL_TABLET | Freq: Every day | ORAL | 3 refills | Status: DC

## 2021-12-12 NOTE — Progress Notes (Signed)
Cardiology Office Note:    Date:  12/14/2021   ID:  Felicia [name], DOB [birth year], MRN [id number]  PCP:  Ria Bush, MD  [facility] Cardiologist:  Sanda Klein, MD  [facility] Electrophysiologist:  None   Referring MD: Ria Bush, MD   Chief Complaint  Patient presents with   Chest Pain    History of Present Illness:    Felicia [name] is a 61 y.o. female with a hx of CAD s/p CABG April 2021, with early vein graft occlusion but patent LIMA to LAD, type 1 diabetes mellitus on insulin, hypercholesterolemia intolerant to statins on PCSK9 inhibitor.  She has recently developed increased frequency of episodes of chest discomfort.  Most of the time the episodes are brief and mild and she just ignores them.  Occasionally they are a little longer and they do respond to nitroglycerin.  She is taking this very infrequently.  They do not necessarily happen with physical activity or with any significant emotional outbursts, but sometimes she does feel that her heart is beating faster when they occur.  They are not really interfering with her daily activity.  Risk factors are well controlled with a hemoglobin A1c that was recently 6.9% and an LDL that is 49, HDL 46.  Triglycerides remain borderline elevated at 186.  She denies problems shortness of breath at rest or with activity.  She does not have palpitations dizziness or syncope.  She has not had lower extremity edema or intermittent claudication.  She has not had falls or bleeding.  Early postoperative course was complicated by an episode of syncope with fall and R orbital fracture and left wrist fracture and coronary/graft angiography showed that she had lost patency of all the SVGs.  The LIMA to LAD bypass was patent.  No intervention was performed.  Echocardiogram showed that left ventricular wall motion was abnormal, significant for moderate hypokinesis of the apical segments of the anterior and anterolateral walls, but  overall LVEF was normal at 55 to 60%.  Diagnostic Dominance: Right      Repeat echocardiogram in August showed a residual apical lateral wall motion abnormality, but normal overall EF.  Her nuclear stress that showed similar findings with an apical scar, no evidence of reversible ischemia and normal left ventricular systolic function.    Past Medical History:  Diagnosis Date   Allergy    Anemia    past history long ago   Borderline HTN (hypertension)    CAD (coronary artery disease)    a. 02/2020 Cath: LM nl, LAD large, 90p, 28m D1 90p, LCX nl, OM 60-70p, RCA dominant w/ dampening upon engagement of ostium. 70p/m, RPDA 50. EF 55-65%.   COVID-19 virus infection 02/2021   Disorder of kidney    has abd kidney (right)   GERD (gastroesophageal reflux disease)    History of diabetic gastroparesis    History of echocardiogram    a. 01/2013 Echo: EF 55-65%, no rwma. Mild MR. Nl RV fxn.   HLD (hyperlipidemia)    Hx: UTI (urinary tract infection)    IBS (irritable bowel syndrome)    Nephrolithiasis 2005   Osteopenia 2012   per pt   Systolic murmur 32/2979  mild mitral insuff   Type 1 diabetes mellitus with diabetic retinopathy without macular edema (HCC)    dx at 61y/o, background retinopathy    Past Surgical History:  Procedure Laterality Date   [place]  COLONOSCOPY  07/2016   WNL (  [place])   CORONARY ARTERY BYPASS GRAFT N/A 02/21/2020   Procedure: CORONARY ARTERY BYPASS GRAFTING (CABG) times five using left internal mammary and right leg saphenous vein;  Surgeon: Lajuana Matte, MD;  Location: [place];  Service: Open Heart Surgery;  Laterality: N/A;   ESOPHAGOGASTRODUODENOSCOPY  07/2016   LA Grade C reflux esophagitis, concern for stasis/dysmotility ([place])   LAPAROSCOPIC APPENDECTOMY N/A 09/01/2015   Procedure: APPENDECTOMY LAPAROSCOPIC;  Surgeon: Judeth Horn, MD;  Location: [place];  Service: General;  Laterality: N/A;   LEFT HEART CATH AND CORONARY  ANGIOGRAPHY N/A 02/17/2020   Procedure: LEFT HEART CATH AND CORONARY ANGIOGRAPHY;  Surgeon: Minna Merritts, MD;  Location: [place] [facility];  Service: Cardiovascular;  Laterality: N/A;   LEFT HEART CATH AND CORS/GRAFTS ANGIOGRAPHY N/A 02/29/2020   early failure of 4/5 bypass grafts - medically managed Angelena Form, Annita Brod, MD)   ORIF FEMUR FRACTURE Left 1996   Corrected by patient in history 2016   PARTIAL HYSTERECTOMY  2000   paps by Erling Con[facility] [place]   TEE WITHOUT CARDIOVERSION N/A 02/21/2020   Procedure: TRANSESOPHAGEAL ECHOCARDIOGRAM (TEE);  Surgeon: Lajuana Matte, MD;  Location: [place];  Service: Open Heart Surgery;  Laterality: N/A;   TONSILLECTOMY AND ADENOIDECTOMY  1975   TRIGGER FINGER RELEASE Left 12/2010   thumb    Current Medications: Current Meds  Medication Sig   aspirin 81 MG EC tablet Take 1 tablet (81 mg total) by mouth daily.   Continuous Blood Gluc Sensor (DEXCOM G6 SENSOR) MISC USE AS INSTRUCTED TO CHECK BLOOD SUGAR CHANGE EVERY 10 DAYS   Continuous Blood Gluc Transmit (DEXCOM G6 TRANSMITTER) MISC USE AS INSTRUCTED CHANGE EVERY 90 DAYS   esomeprazole (NEXIUM) 40 MG capsule Take 1 capsule (40 mg total) by mouth daily. Patient needs office visit for further refills   Evolocumab (REPATHA SURECLICK) 956 MG/ML SOAJ Inject 140 mg into the skin every 14 (fourteen) days.   glucagon (GLUCAGON EMERGENCY) 1 MG injection Inject 1 mg into the muscle once as needed (in case of severe hypoglycemia).   Insulin Disposable Pump (OMNIPOD 5 G6 INTRO, GEN 5,) KIT 1 each by Does not apply route as needed.   Insulin Disposable Pump (OMNIPOD 5 G6 POD, GEN 5,) MISC 1 each by Does not apply route every 3 (three) days.   Insulin Pen Needle (NOVOFINE PEN NEEDLE) 32G X 6 MM MISC USE TO INJECT INSULIN 4 TIMES A DAY   insulin regular (NOVOLIN R) 100 units/mL injection INJECT 6 UNITS TOTAL INTO THE SKIN 3 (THREE) TIMES DAILY BEFORE MEALS.   LANTUS SOLOSTAR 100 UNIT/ML Solostar  Pen INJECT 12 UNITS INTO THE SKIN EVERY DAY   Multiple Vitamin (MULTIVITAMIN) tablet Take 1 tablet by mouth daily.   [DISCONTINUED] Insulin Syringe-Needle U-100 (TECHLITE INSULIN SYRINGE) 31G X 15/64" 0.3 ML MISC Use up to 5 times daily to inject insulin daily   [DISCONTINUED] metoprolol succinate (TOPROL XL) 25 MG 24 hr tablet Take 0.5 tablets (12.5 mg total) by mouth daily.     Allergies:   Protonix [pantoprazole], Toujeo solostar [insulin glargine], Lactose intolerance (gi), Magnesium-containing compounds, and Adhesive [tape]   Social History   Socioeconomic History   Marital status: Married    Spouse name: Not on file   Number of children: 1   Years of education: 16   Highest education level: Bachelor's degree (e.g., BA, AB, BS)  Occupational History   Occupation: HR Marketing executive: OTHER    Comment: [employer]  Tobacco Use   Smoking status: Never   Smokeless tobacco: Never  Vaping Use   Vaping Use: Never used  Substance and Sexual Activity   Alcohol use: No    Alcohol/week: 0.0 standard drinks   Drug use: No   Sexual activity: Yes    Birth control/protection: None    Comment: Married  Other Topics Concern   Not on file  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  Has one daughter.  Education: college.      1 dog   Social Determinants of Radio broadcast assistant Strain: Not on file  Food Insecurity: Not on file  Transportation Needs: Not on file  Physical Activity: Not on file  Stress: Not on file  Social Connections: Not on file     Family History: The patient's family history includes Breast cancer in an other family member; CAD (age of onset: 72) in her brother; COPD in her maternal grandmother; Colon cancer in her maternal grandfather; Colon polyps in her mother; Diabetes in her maternal grandmother; Heart failure in her maternal grandmother; Thyroid disease in her  maternal grandmother; Vascular Disease in her maternal grandmother. There is no history of Esophageal cancer, Rectal cancer, or Stomach cancer.  ROS:   Please see the history of present illness.     All other systems reviewed and are negative.  EKGs/Labs/Other Studies Reviewed:    The following studies were reviewed today: Cardiac catheterizations performed on 4/15 (before bypass) and 4/27 (after bypass surgery) Echocardiograms performed on 4/15 and 4/25  Nuclear stress test 08/03/2020    The left ventricular ejection fraction is hyperdynamic (>65%). Nuclear stress EF: 70%. There was no ST segment deviation noted during stress. Defect 1: There is a small defect of severe severity present in the apical anterior and apex location.   There is a small irreversible defect of severe severity present in the apical anterior and apex location consistent with prior infarct in the distal LAD territory, there is no peri-infarct ischemia, overall LVEF is normal.     Echo 06/12/2020  1. Left ventricular ejection fraction, by estimation, is 55 to 60%. The  left ventricle has normal function. The left ventricle has no regional  wall motion abnormalities. Left ventricular diastolic parameters are  consistent with Grade II diastolic  dysfunction (pseudonormalization).   2. Right ventricular systolic function is normal. The right ventricular  size is normal.   3. The mitral valve is normal in structure. Mild mitral valve  regurgitation. No evidence of mitral stenosis.   4. The aortic valve is tricuspid. Aortic valve regurgitation is not  visualized. Mild aortic valve sclerosis is present, with no evidence of  aortic valve stenosis.   5. The inferior vena cava is normal in size with greater than 50%  respiratory variability, suggesting right atrial pressure of 3 mmHg.   EKG:  EKG is ordered today.  Personally reviewed, it is very similar to previous tracings.  Shows normal sinus rhythm with QS in  leads V1-V2 but without any repolarization abnormalities.   Recent Labs: 06/11/2021: ALT 13; TSH 2.85 10/05/2021: BUN 14; Creatinine, Ser 0.99; Hemoglobin 12.4; Platelets 268.0; Potassium 4.5; Sodium 139  Recent Lipid Panel    Component Value Date/Time   CHOL 132 10/05/2021 0755   TRIG 186.0 (H) 10/05/2021 0755   HDL 46.00 10/05/2021 0755   CHOLHDL 3 10/05/2021 0755  VLDL 37.2 10/05/2021 0755   LDLCALC 49 10/05/2021 0755   LDLDIRECT 120.0 06/17/2019 0738    Physical Exam:    VS:  BP 140/86    Pulse 67    Ht _0  (1.626 m)    Wt 134 lb (60.8 kg)    SpO2 99%    BMI 23.00 kg/m     Wt Readings from Last 3 Encounters:  12/12/21 134 lb (60.8 kg)  11/30/21 132 lb 9.6 oz (60.1 kg)  07/27/21 131 lb 6.4 oz (59.6 kg)      General: Alert, oriented x3, no distress, appears well Head: no evidence of trauma, PERRL, EOMI, no exophtalmos or lid lag, no myxedema, no xanthelasma; normal ears, nose and oropharynx Neck: normal jugular venous pulsations and no hepatojugular reflux; brisk carotid pulses without delay and no carotid bruits Chest: clear to auscultation, no signs of consolidation by percussion or palpation, normal fremitus, symmetrical and full respiratory excursions Cardiovascular: normal position and quality of the apical impulse, regular rhythm, normal first and second heart sounds, 1/6 early peaking aortic ejection murmur, no diastolic murmurs, rubs or gallops Abdomen: no tenderness or distention, no masses by palpation, no abnormal pulsatility or arterial bruits, normal bowel sounds, no hepatosplenomegaly Extremities: no clubbing, cyanosis or edema; 2+ radial, ulnar and brachial pulses bilaterally; 2+ right femoral, posterior tibial and dorsalis pedis pulses; 2+ left femoral, posterior tibial and dorsalis pedis pulses; no subclavian or femoral bruits Neurological: grossly nonfocal Psych: Normal mood and affect    ASSESSMENT:    1. Coronary artery disease involving coronary  bypass graft of native heart with other forms of angina pectoris ([place])   2. Mixed hyperlipidemia   3. Controlled type 1 diabetes mellitus with complication, with long-term current use of insulin (HCC)     PLAN:    In order of problems listed above:  CAD s/p CABG: She is having some issues with angina pectoris, albeit not severe.  She has extensive coronary disease and had early graft failure of all 3 vein grafts.  There are many territories likely responsible for ischemic symptoms.  She has mostly done very well with medical therapy.  She has preserved left ventricular systolic function.  She has had long periods of time without any symptoms at all.  Although she is recently having issues with chest pain it really does not sound like a pattern concerning for unstable angina.  She is on low-dose aspirin and appropriate lipid-lowering therapy with the Repatha (intolerant to statins).  She has not tolerated higher doses of beta-blocker in the past due to hypotension, but seems to have more blood pressure to work with now.  In the office today her blood pressure was 140, but usually at home her blood pressure is in the 100-120/60-70 range.  We will try to increase the metoprolol succinate to 25 mg daily.  She did not do well with long-acting nitrates because of headache.  If beta-blocker adjustment does not work, would strongly consider Ranexa as a neck step. HLP: Statin intolerant but with excellent improvement in lipid profile on PCSK9 inhibitor.  Excellent LDL cholesterol reduction.  Borderline triglyceride elevation does not require separate therapy.  Glycemic control is better. DM: Type 1 diabetes mellitus for 45 years..  She is very insulin sensitive.  Remarkable improvement in glycemic control recently, seen by Dr. Cruzita Lederer in the endocrinology clinic. Recent hemoglobin A1c of 6.9%.  Medication Adjustments/Labs and Tests Ordered: Current medicines are reviewed at length with the patient today.  Concerns regarding medicines are outlined above.  Orders Placed This Encounter  Procedures   EKG 12-Lead   Meds ordered this encounter  Medications   metoprolol succinate (TOPROL XL) 25 MG 24 hr tablet    Sig: Take 1 tablet (25 mg total) by mouth daily.    Dispense:  90 tablet    Refill:  3    Patient Instructions  Medication Instructions:  INCREASE the Metoprolol Succinate to 25 mg once daily  *If you need a refill on your cardiac medications before your next appointment, please call your pharmacy*   Lab Work: None ordered If you have labs (blood work) drawn today and your tests are completely normal, you will receive your results only by: [place] (if you have MyChart) OR A paper copy in the mail If you have any lab test that is abnormal or we need to change your treatment, we will call you to review the results.   Testing/Procedures: None ordered   Follow-Up: At [facility], you and your health needs are our priority.  As part of our continuing mission to provide you with exceptional heart care, we have created designated Provider Care Teams.  These Care Teams include your primary Cardiologist (physician) and Advanced Practice Providers (APPs -  Physician Assistants and Nurse Practitioners) who all work together to provide you with the care you need, when you need it.  We recommend signing up for the patient portal called "MyChart".  Sign up information is provided on this After Visit Summary.  MyChart is used to connect with patients for Virtual Visits (Telemedicine).  Patients are able to view lab/test results, encounter notes, upcoming appointments, etc.  Non-urgent messages can be sent to your provider as well.   To learn more about what you can do with MyChart, go to [email or web address].    Your next appointment:   6 month(s)  The format for your next appointment:   In Person  Provider:   Sanda Klein, MD {      Signed, Sanda Klein, MD   12/14/2021 10:35 AM    [place]

## 2021-12-12 NOTE — Patient Instructions (Signed)
Medication Instructions:  INCREASE the Metoprolol Succinate to 25 mg once daily  *If you need a refill on your cardiac medications before your next appointment, please call your pharmacy*   Lab Work: None ordered If you have labs (blood work) drawn today and your tests are completely normal, you will receive your results only by: MyChart Message (if you have MyChart) OR A paper copy in the mail If you have any lab test that is abnormal or we need to change your treatment, we will call you to review the results.   Testing/Procedures: None ordered   Follow-Up: At [facility], you and your health needs are our priority.  As part of our continuing mission to provide you with exceptional heart care, we have created designated Provider Care Teams.  These Care Teams include your primary Cardiologist (physician) and Advanced Practice Providers (APPs -  Physician Assistants and Nurse Practitioners) who all work together to provide you with the care you need, when you need it.  We recommend signing up for the patient portal called "MyChart".  Sign up information is provided on this After Visit Summary.  MyChart is used to connect with patients for Virtual Visits (Telemedicine).  Patients are able to view lab/test results, encounter notes, upcoming appointments, etc.  Non-urgent messages can be sent to your provider as well.   To learn more about what you can do with MyChart, go to [email or web address].    Your next appointment:   6 month(s)  The format for your next appointment:   In Person  Provider:   Thurmon Fair, MD {

## 2021-12-13 ENCOUNTER — Encounter: Payer: Self-pay | Admitting: Cardiovascular Disease

## 2021-12-13 ENCOUNTER — Other Ambulatory Visit (HOSPITAL_COMMUNITY): Payer: Self-pay

## 2021-12-13 ENCOUNTER — Encounter: Payer: Self-pay | Admitting: Internal Medicine

## 2021-12-13 ENCOUNTER — Telehealth: Payer: Self-pay

## 2021-12-13 DIAGNOSIS — E103293 Type 1 diabetes mellitus with mild nonproliferative diabetic retinopathy without macular edema, bilateral: Secondary | ICD-10-CM

## 2021-12-13 MED ORDER — "TECHLITE INSULIN SYRINGE 31G X 15/64"" 0.3 ML MISC": 1 refills | Status: DC

## 2021-12-13 MED ORDER — DAPAGLIFLOZIN PROPANEDIOL 5 MG PO TABS: 5.0000 mg | ORAL_TABLET | Freq: Every day | ORAL | 1 refills | Status: DC

## 2021-12-13 NOTE — Telephone Encounter (Addendum)
Patient Advocate Encounter   Received notification from [facility] that prior authorization for Omnipod 5 G6 kit & pods is required by his/her insurance [facility]   PA submitted on 12/13/21  Key#: [id number] (kit) Key#: [id number] (pods)  Status is pending    [facility] will continue to follow:  Patient Advocate Fax: [phone]

## 2021-12-14 ENCOUNTER — Other Ambulatory Visit (HOSPITAL_COMMUNITY): Payer: Self-pay

## 2021-12-14 ENCOUNTER — Encounter: Payer: Self-pay | Admitting: Cardiovascular Disease

## 2021-12-14 NOTE — Telephone Encounter (Signed)
Patient Advocate Encounter  Prior Authorization for Omnipod 5 G6 kit & pods has been approved.    PA# N/A  Effective dates: 12/13/21 through 12/12/22  Per Test Claim Patients co-pay is $0.   Spoke with Pharmacy to Process.  Patient Advocate Fax: [phone]

## 2021-12-21 ENCOUNTER — Encounter: Payer: Self-pay | Admitting: Internal Medicine

## 2022-01-01 ENCOUNTER — Encounter: Payer: BC Managed Care – PPO | Attending: Urology | Admitting: Nutrition

## 2022-01-01 ENCOUNTER — Other Ambulatory Visit: Payer: Self-pay

## 2022-01-01 DIAGNOSIS — E1059 Type 1 diabetes mellitus with other circulatory complications: Secondary | ICD-10-CM | POA: Insufficient documentation

## 2022-01-02 ENCOUNTER — Telehealth: Payer: Self-pay | Admitting: Nutrition

## 2022-01-02 NOTE — Patient Instructions (Signed)
Read over starter booklet and pump manuel Call office if blood sugars remain high, or drop below 70 Call pump 800 number if pump questions Change pod every 3 days Bolus before all meals and make sure there is a blood sugar readings with this.

## 2022-01-02 NOTE — Progress Notes (Addendum)
Patient reports having been on a pump "a few years ago".  We reviewed basal bolus insulin delivery and steps to bolus.  Settings were put into the PDM by patient:  basal rate:.  0.35u/hr, I/C: 5  ISF:60  target: 110 with correction over 110.  Timing 4 hr.  She filled  a pod with novolog insulin and applied it to her right abdomen.  We discussed pod placement with reference to her Dexcom.  Dexcom was linked to the pod, and PDM was linked via glooko to [place]. She is also sharing with dexcom alone.   She re demonstrated how to bolus X2 correctly, as well as how/when to do a correction dose.  The pump was put in automated mode, and we discussed that it will take 7 days before it will work successfully.  She reported good understanding of this, and had no final questions.

## 2022-01-02 NOTE — Telephone Encounter (Signed)
Patient reported that blood sugars dropped low X3 since starting the pump--once during the night.  She treated this with juice and FBS was 80 this AM.  It then dropped X2 today- once at 10 AM, without breakfast and again now at 3PM 3hr. pcL.  She reported that she counted carbs correctly at lunch today.  Basal rate dropped to 0.25u/hr. She was told to call the office if blood sugars rise or continue to drop tomorrow, or Friday.  She agreed to do this.   ?She reported no difficulty giving boluses and sleeping with the pump.  He had no final questions for me. ?

## 2022-01-04 ENCOUNTER — Telehealth: Payer: Self-pay | Admitting: Nutrition

## 2022-01-04 NOTE — Telephone Encounter (Signed)
Patient reports no difficulty in changing pod, but says still dropping low 1 1/2 hrs after meals.  FBS today was 180, and she dropped low.  ISF changed from 60 to 70 and I/C changed from 5 to 7.5.  She will call on Monday to review download. ?

## 2022-01-09 ENCOUNTER — Encounter: Payer: Self-pay | Admitting: Internal Medicine

## 2022-01-18 ENCOUNTER — Encounter: Payer: Self-pay | Admitting: *Deleted

## 2022-01-23 ENCOUNTER — Telehealth: Payer: Self-pay | Admitting: Nutrition

## 2022-01-23 ENCOUNTER — Other Ambulatory Visit: Payer: Self-pay

## 2022-01-23 ENCOUNTER — Encounter: Payer: BC Managed Care – PPO | Attending: Urology | Admitting: Nutrition

## 2022-01-23 DIAGNOSIS — E1059 Type 1 diabetes mellitus with other circulatory complications: Secondary | ICD-10-CM | POA: Insufficient documentation

## 2022-01-23 NOTE — Telephone Encounter (Signed)
Patient called and left message that blood sugar went to 550 yesterday with no corrections or alarms from Dexcom or pump.  Very upset and concerned.  She had no syringes and gave boluses which "finally got her back down.   ?She was told to come in today to review settings on pdm.  She reports having call Dexcom and they told her to update her phone and to reinstall the Dexcom app.  Target on pdm is set to 150.   ?

## 2022-01-23 NOTE — Telephone Encounter (Signed)
Felicia [name], ?As per our discussion, let's relax the insulin to carb ratios and lower the target.  Also, please encourage her to keep the pump more time before (ideally a year) before deciding whether this is a good fit for her or not. ?

## 2022-01-24 ENCOUNTER — Encounter: Payer: Self-pay | Admitting: Internal Medicine

## 2022-01-24 NOTE — Progress Notes (Signed)
See phone message.  Pt. Very upset that she did not receive alerts and that pump did not bring down unexpalined 500 blood sugar readings.  Discussed that put did do a basal max for 2 hours and then sent her an alarm that it was transferring to manual mode.  We reviewed what to do when this happens. ?She reports having called dexcom, and they talked her into updating her phone and reinstalling the Dexcom app.   ?She had another high last night and now alarm from her phone/pump. ?She was told to shut her phone down and restart it--something that she has never done.  She was told that this may help since she had made changes to her settings the day before. ?Her download was shown to Dr. Cruzita Lederer and changes were made to I/C ratio and sensitivity factor, and target blood sugars.  This was changed from 140 with correction over 150 to 130 with correction over 130.  Marland Kitchen   ?We also discussed high blood sugar protocol and how this pump works for her when this happes. ?She was told that I will call Irven Baltimore after she gets back from vacation, next  week,  if she wants this.  She agreed to have her call her to help explain what had happened with help to review this download.  ?LVM for laura Hite of this problem and to call her after she returns from vacation ?

## 2022-01-24 NOTE — Patient Instructions (Signed)
Once a week turn phone off, and restart it ?Call Dexcom if still not having alarms come through phone for high blood sugar readings. ? ?

## 2022-01-31 ENCOUNTER — Ambulatory Visit: Payer: BC Managed Care – PPO | Admitting: Internal Medicine

## 2022-02-01 ENCOUNTER — Telehealth: Payer: Self-pay | Admitting: *Deleted

## 2022-02-01 ENCOUNTER — Encounter: Payer: Self-pay | Admitting: Internal Medicine

## 2022-02-01 ENCOUNTER — Ambulatory Visit (INDEPENDENT_AMBULATORY_CARE_PROVIDER_SITE_OTHER): Payer: BC Managed Care – PPO | Admitting: Internal Medicine

## 2022-02-01 VITALS — BP 122/74 | HR 61 | Ht 64.0 in | Wt 133.2 lb

## 2022-02-01 DIAGNOSIS — K582 Mixed irritable bowel syndrome: Secondary | ICD-10-CM

## 2022-02-01 DIAGNOSIS — R1319 Other dysphagia: Secondary | ICD-10-CM | POA: Diagnosis not present

## 2022-02-01 DIAGNOSIS — K219 Gastro-esophageal reflux disease without esophagitis: Secondary | ICD-10-CM | POA: Diagnosis not present

## 2022-02-01 NOTE — Patient Instructions (Signed)
You have been scheduled for an endoscopy. Please follow written instructions given to you at your visit today. ?If you use inhalers (even only as needed), please bring them with you on the day of your procedure. ? ?Continue Nexium 40 mg daily. ? ?Please purchase the following medications over the counter and take as directed: ?Metamucil OR Citrucel 2 teaspoons twice daily ? ?If you are age 59 or older, your body mass index should be between 23-30. Your Body mass index is 22.86 kg/m?Marland Kitchen If this is out of the aforementioned range listed, please consider follow up with your Primary Care Provider. ? ?If you are age 43 or younger, your body mass index should be between 19-25. Your Body mass index is 22.86 kg/m?Marland Kitchen If this is out of the aformentioned range listed, please consider follow up with your Primary Care Provider.  ? ?________________________________________________________ ? ?The [facility] providers would like to encourage you to use [facility] to communicate with providers for non-urgent requests or questions.  Due to long hold times on the telephone, sending your provider a message by [facility] may be a faster and more efficient way to get a response.  Please allow 48 business hours for a response.  Please remember that this is for non-urgent requests.  ?_______________________________________________________ ? ?Due to recent changes in healthcare laws, you may see the results of your imaging and laboratory studies on MyChart before your provider has had a chance to review them.  We understand that in some cases there may be results that are confusing or concerning to you. Not all laboratory results come back in the same time frame and the provider may be waiting for multiple results in order to interpret others.  Please give [place] 48 hours in order for your provider to thoroughly review all the results before contacting the office for clarification of your results.  ? ?

## 2022-02-01 NOTE — Telephone Encounter (Signed)
I have spoken to Felicia [name] to advise her of Dr Charlean Sanfilippo recommendations regarding insulin pump instructions prior to endoscopy. Felicia [name] verbalizes understanding. ?

## 2022-02-01 NOTE — Telephone Encounter (Signed)
===  View-only below this line=== ?----- Message ----- ?From: Carlus Pavlov, MD ?Sent: 02/01/2022   3:44 PM EDT ?To: Richardson Chiquito, CMA ? ?Hi Ashwath Lasch, ?For patients on the pump, I usually recommend to continue the same basal rates, but not to bolus for meals unless they are eating regular meals and only do corrections if the sugars increase above 180.  They can resume taking boluses for meals when they start eating after the colonoscopy. ?Sincerely, ?Carlus Pavlov MD ?

## 2022-02-01 NOTE — Telephone Encounter (Signed)
Elliot Dally ?Mar 07, 1961 ?[id number] ? ? ?Dear Dr. Dr Elvera Lennox  ? ? ?Dr. Rhea Belton has scheduled the above individual for a(n) endoscopy at [facility] on 04/17/22.  Our records show that this patient is on insulin therapy via an insulin pump. ? ?Our colonoscopy prep protocol requires that: ? ?? the patient must be on a clear liquid diet starting midnight prior to the procedure date as well as the morning of the procedure ?? the patient must be NPO for 3 to 4 hours prior to the procedure  ? ?Please advise [place] of any adjustments that need to be made to the patient?s insulin pump therapy prior to the above procedure date.   ? ?Please route your response to Vernia Buff, CMA. ? ?Thank you for your help with this matter. ? ?Sincerely, ? ?Vernia Buff, CMA ? ? ? ?Physician Recommendation:  _________________________________________________________________ ?_________________________________________________________________ ?_________________________________________________________________ ?_________________________________________________________________ ? ?

## 2022-02-01 NOTE — Progress Notes (Signed)
Patient ID: Felicia [name], female   DOB: Nov 29, 1960, 61 y.o.   MRN: [id number] ?HPI: ?Felicia [name] is a 60 year old female with a history of GERD with esophagitis, CAD, diabetes-type 1, hyperlipidemia, who is seen to discuss issues with swallowing.  She is here alone today and was last seen in 2019. ? ?Patient had prior upper endoscopy revealing LA grade C esophagitis in 2017 ?Normal colonoscopy 2017. ? ?She reports that her biggest GI issue is dysphagia.  Solid food.  Intermittent but nearly daily issue.  She has modified her diet and avoiding foods like breads, rice and pasta.  Some meats bother her but not all meats.  Weight stable.  No odynophagia.  No heartburn.  She has been taking Nexium 40 mg daily.  She has changed her diet dramatically since her coronary artery disease diagnosis in 2021.  She is eating lean meats and frequent fruits and vegetables.  Less carbohydrates, white flour and processed sugars.  She has noticed most recently even without Nexium she does not seem to have heartburn though she has been taking Nexium daily. ? ?Also she continues to have bowel movements which she states have been lifelong.  She has been trying a daily fiber tablet.  She describes habit where she will not go to the bathroom for 2 or 3 days.  Then, she will have a day where she will go multiple times and stools will come progressively looser.  Popcorn seems to be a trigger for looser stools.  No blood in stool or melena. ? ?Past Medical History:  ?Diagnosis Date  ? Allergy   ? Anemia   ? past history long ago  ? Borderline HTN (hypertension)   ? CAD (coronary artery disease)   ? a. 02/2020 Cath: LM nl, LAD large, 90p, 75m D1 90p, LCX nl, OM 60-70p, RCA dominant w/ dampening upon engagement of ostium. 70p/m, RPDA 50. EF 55-65%.  ? COVID-19 virus infection 02/2021  ? Diabetes ([place]   ? Disorder of kidney   ? has abd kidney (right)  ? GERD (gastroesophageal reflux disease)   ? History of diabetic gastroparesis   ? History of  echocardiogram   ? a. 01/2013 Echo: EF 55-65%, no rwma. Mild MR. Nl RV fxn.  ? HLD (hyperlipidemia)   ? Hx: UTI (urinary tract infection)   ? IBS (irritable bowel syndrome)   ? Nephrolithiasis 2005  ? Osteopenia 2012  ? per pt  ? Reflux esophagitis   ? Systolic murmur 010/2585 ? mild mitral insuff  ? Type 1 diabetes mellitus with diabetic retinopathy without macular edema (HCC)   ? dx at 61y/o, background retinopathy  ? ? ?Past Surgical History:  ?Procedure Laterality Date  ? APPENDECTOMY    ? [place] ? COLONOSCOPY  07/2016  ? WNL (Felicia [name])  ? CORONARY ARTERY BYPASS GRAFT N/A 02/21/2020  ? Procedure: CORONARY ARTERY BYPASS GRAFTING (CABG) times five using left internal mammary and right leg saphenous vein;  Surgeon: LLajuana Matte MD;  [place]  Service: Open Heart Surgery;  Laterality: N/A;  ? ESOPHAGOGASTRODUODENOSCOPY  07/2016  ? LA Grade C reflux esophagitis, concern for stasis/dysmotility (Felicia [name])  ? LAPAROSCOPIC APPENDECTOMY N/A 09/01/2015  ? Procedure: APPENDECTOMY LAPAROSCOPIC;  Surgeon: Felicia Horn MD;  [place]  Service: General;  Laterality: N/A;  ? LEFT HEART CATH AND CORONARY ANGIOGRAPHY N/A 02/17/2020  ? Procedure: LEFT HEART CATH AND CORONARY ANGIOGRAPHY;  Surgeon: Felicia Merritts MD;  [place]  [facility];  Service: Cardiovascular;  Laterality: N/A;  ? LEFT HEART CATH AND CORS/GRAFTS ANGIOGRAPHY N/A 02/29/2020  ? early failure of 4/5 bypass grafts - medically managed Felicia [name], Felicia Brod, MD)  ? ORIF FEMUR FRACTURE Left 1996  ? Corrected by patient in history 2016  ? PARTIAL HYSTERECTOMY  2000  ? paps by Felicia [name] OB/GYN Felicia [name]  ? TEE WITHOUT CARDIOVERSION N/A 02/21/2020  ? Procedure: TRANSESOPHAGEAL ECHOCARDIOGRAM (TEE);  Surgeon: Felicia Matte, MD;  Location: [place];  Service: Open Heart Surgery;  Laterality: N/A;  ? Felicia [name]  ? TRIGGER FINGER RELEASE Left 12/2010  ? thumb  ? ? ?Outpatient Medications Prior to  Visit  ?Medication Sig Dispense Refill  ? aspirin 81 MG EC tablet Take 1 tablet (81 mg total) by mouth daily.    ? Continuous Blood Gluc Sensor (DEXCOM G6 SENSOR) MISC USE AS INSTRUCTED TO CHECK BLOOD SUGAR CHANGE EVERY 10 DAYS 9 each 1  ? Continuous Blood Gluc Transmit (DEXCOM G6 TRANSMITTER) MISC USE AS INSTRUCTED CHANGE EVERY 90 DAYS 1 each 1  ? esomeprazole (NEXIUM) 40 MG capsule Take 1 capsule (40 mg total) by mouth daily. Patient needs office visit for further refills 90 capsule 0  ? Evolocumab (REPATHA SURECLICK) 883 MG/ML SOAJ Inject 140 mg into the skin every 14 (fourteen) days. 6 mL 3  ? glucagon (GLUCAGON EMERGENCY) 1 MG injection Inject 1 mg into the muscle once as needed (in case of severe hypoglycemia). 1 each 12  ? Insulin Disposable Pump (OMNIPOD 5 G6 INTRO, GEN 5,) KIT 1 each by Does not apply route as needed. 1 kit 0  ? Insulin Disposable Pump (OMNIPOD 5 G6 POD, GEN 5,) MISC 1 each by Does not apply route every 3 (three) days. 30 each 3  ? insulin regular (NOVOLIN R) 100 units/mL injection INJECT 6 UNITS TOTAL INTO THE SKIN 3 (THREE) TIMES DAILY BEFORE MEALS. 30 mL 4  ? Insulin Syringe-Needle U-100 (TECHLITE INSULIN SYRINGE) 31G X 15/64" 0.3 ML MISC Use up to 5 times daily to inject insulin daily 400 each 1  ? metoprolol succinate (TOPROL XL) 25 MG 24 hr tablet Take 1 tablet (25 mg total) by mouth daily. 90 tablet 3  ? Multiple Vitamin (MULTIVITAMIN) tablet Take 1 tablet by mouth daily.    ? nitroGLYCERIN (NITROSTAT) 0.4 MG SL tablet Place 1 tablet (0.4 mg total) under the tongue every 5 (five) minutes as needed for chest pain. 25 tablet 3  ? dapagliflozin propanediol (FARXIGA) 5 MG TABS tablet Take 1 tablet (5 mg total) by mouth daily before breakfast. (Patient not taking: Reported on 02/01/2022) 90 tablet 1  ? Insulin Pen Needle (NOVOFINE PEN NEEDLE) 32G X 6 MM MISC USE TO INJECT INSULIN 4 TIMES A DAY 200 each 1  ? LANTUS SOLOSTAR 100 UNIT/ML Solostar Pen INJECT 12 UNITS INTO THE SKIN EVERY DAY 15  mL 1  ? ?No facility-administered medications prior to visit.  ? ? ?Allergies  ?Allergen Reactions  ? Protonix [Pantoprazole] Swelling  ?  Facial Swelling  ? Toujeo Solostar [Insulin Glargine] Shortness Of Breath  ? Lactose Intolerance (Gi)   ?  GI  ? Magnesium-Containing Compounds Swelling and Other (See Comments)  ?  limbs  ? Adhesive [Tape] Rash and Other (See Comments)  ?  Band aids  ? ? ?Family History  ?Problem Relation Age of Onset  ? Diabetes Maternal Grandmother   ? Heart failure Maternal Grandmother   ? COPD Maternal Grandmother   ?  smoker  ? Thyroid disease Maternal Grandmother   ? Vascular Disease Maternal Grandmother   ? Colon cancer Maternal Grandfather   ? Colon polyps Mother   ? Breast cancer Other   ? CAD Brother 13  ?     stent  ? Esophageal cancer Neg Hx   ? Rectal cancer Neg Hx   ? Stomach cancer Neg Hx   ? ? ?Social History  ? ?Tobacco Use  ? Smoking status: Never  ? Smokeless tobacco: Never  ?Vaping Use  ? Vaping Use: Never used  ?Substance Use Topics  ? Alcohol use: No  ?  Alcohol/week: 0.0 standard drinks  ? Drug use: No  ? ? ?ROS: ?As per history of present illness, otherwise negative ? ?Ht _0  (1.626 m)   Wt 133 lb 3.2 oz (60.4 kg)   BMI 22.86 kg/m?  ?Gen: awake, alert, NAD ?HEENT: anicteric  ?CV: RRR, no mrg ?Pulm: CTA b/l ?Abd: soft, NT/ND, +BS throughout, insulin pump left lower abdomen ?Ext: no c/c/e ?Neuro: nonfocal ? ? ?RELEVANT LABS AND IMAGING: ?CBC ?   ?Component Value Date/Time  ? WBC 4.6 10/05/2021 0755  ? RBC 4.17 10/05/2021 0755  ? HGB 12.4 10/05/2021 0755  ? HCT 37.8 10/05/2021 0755  ? PLT 268.0 10/05/2021 0755  ? MCV 90.6 10/05/2021 0755  ? MCH 29.3 03/01/2020 0652  ? MCHC 32.9 10/05/2021 0755  ? RDW 13.7 10/05/2021 0755  ? LYMPHSABS 1.4 10/05/2021 0755  ? MONOABS 0.4 10/05/2021 0755  ? EOSABS 0.2 10/05/2021 0755  ? BASOSABS 0.0 10/05/2021 0755  ? ? ?CMP  ?   ?Component Value Date/Time  ? NA 139 10/05/2021 0755  ? K 4.5 10/05/2021 0755  ? CL 103 10/05/2021 0755  ? CO2  27 10/05/2021 0755  ? GLUCOSE 197 (H) 10/05/2021 0755  ? BUN 14 10/05/2021 0755  ? CREATININE 0.99 10/05/2021 0755  ? CREATININE 0.92 09/17/2017 0835  ? CALCIUM 9.0 10/05/2021 0755  ? PROT 6.6 08/08/

## 2022-02-18 ENCOUNTER — Encounter: Payer: Self-pay | Admitting: Internal Medicine

## 2022-02-19 NOTE — Telephone Encounter (Signed)
I contacted Felicia [name] on 3/27 and again texted her today.

## 2022-02-20 ENCOUNTER — Encounter: Payer: Self-pay | Admitting: Internal Medicine

## 2022-02-21 ENCOUNTER — Telehealth: Payer: Self-pay

## 2022-02-21 DIAGNOSIS — E103293 Type 1 diabetes mellitus with mild nonproliferative diabetic retinopathy without macular edema, bilateral: Secondary | ICD-10-CM

## 2022-02-21 MED ORDER — INSULIN REGULAR HUMAN 100 UNIT/ML IJ SOLN: INTRAMUSCULAR | 0 refills | Status: DC

## 2022-02-24 DIAGNOSIS — J069 Acute upper respiratory infection, unspecified: Secondary | ICD-10-CM | POA: Diagnosis not present

## 2022-02-25 NOTE — Telephone Encounter (Signed)
Pt called requesting a refill on Novolin R. Rx was sent to preferred pharmacy.  ?

## 2022-02-26 ENCOUNTER — Ambulatory Visit: Payer: BC Managed Care – PPO | Admitting: Cardiovascular Disease

## 2022-02-27 ENCOUNTER — Ambulatory Visit: Payer: BC Managed Care – PPO | Admitting: Family

## 2022-02-27 ENCOUNTER — Encounter: Payer: Self-pay | Admitting: Family

## 2022-02-27 ENCOUNTER — Encounter: Payer: Self-pay | Admitting: Family Medicine

## 2022-02-27 VITALS — BP 120/68 | HR 57 | Temp 98.1°F | Ht 64.0 in | Wt 131.0 lb

## 2022-02-27 DIAGNOSIS — J302 Other seasonal allergic rhinitis: Secondary | ICD-10-CM | POA: Diagnosis not present

## 2022-02-27 DIAGNOSIS — J0110 Acute frontal sinusitis, unspecified: Secondary | ICD-10-CM | POA: Diagnosis not present

## 2022-02-27 MED ORDER — AMOXICILLIN-POT CLAVULANATE 875-125 MG PO TABS: 1.0000 | ORAL_TABLET | Freq: Two times a day (BID) | ORAL | 0 refills | Status: AC

## 2022-02-27 NOTE — Assessment & Plan Note (Signed)
Prescription given for augmentin 875/125 mg po bid for ten days. Pt to continue tylenol/ibuprofen prn sinus pain. Continue with humidifier prn and steam showers recommended as well. instructed If no symptom improvement in 48 hours please f/u ? ?Recommend daily flonase continue claritin ?

## 2022-02-27 NOTE — Assessment & Plan Note (Signed)
Continue claritin 10 mg ?Recommend starting flonase ?

## 2022-02-27 NOTE — Progress Notes (Signed)
? ?Established Patient Office Visit ? ?Subjective:  ?Patient ID: Felicia [name], female    DOB: 09-30-1961  Age: 61 y.o. MRN: [id number] ? ?CC:  ?Chief Complaint  ?Patient presents with  ? Cough  ?  X 1 week; did have congestion and ear clogged but that is mostly better. Still has some head congestion but mostly coughing is horrible. Has been taking musinex and delsym for the cough. Did covid test Monday and was negative.  ? ? ?HPI ?Felicia [name] is here today with concerns.  ?Covid tested and was negative two days ago. ? ?Coughing a lot and productive with clear sputum, does feel some mid to upper back pain with cough as well. Started off with sneezing, runny nose, thought allergies. Tried decongestant, mucinex and delsym with mild relief however seeming to only get worse. With nasal congestion, rhinorrhea, did have ear pain which has since resolved. No sore throat. Does have frontal sinus pressure. ? ?Sx for the last 1.5 weeks. ? ?No fever or chills.  ? ?Past Medical History:  ?Diagnosis Date  ? Allergy   ? Anemia   ? past history long ago  ? Borderline HTN (hypertension)   ? CAD (coronary artery disease)   ? a. 02/2020 Cath: LM nl, LAD large, 90p, 21m D1 90p, LCX nl, OM 60-70p, RCA dominant w/ dampening upon engagement of ostium. 70p/m, RPDA 50. EF 55-65%.  ? COVID-19 virus infection 02/2021  ? [place]   ? Disorder of kidney   ? has abd kidney (right)  ? GERD (gastroesophageal reflux disease)   ? History of diabetic gastroparesis   ? History of echocardiogram   ? a. 01/2013 Echo: EF 55-65%, no rwma. Mild MR. Nl RV fxn.  ? HLD (hyperlipidemia)   ? Hx: UTI (urinary tract infection)   ? IBS (irritable bowel syndrome)   ? Nephrolithiasis 2005  ? Osteopenia 2012  ? per pt  ? Reflux esophagitis   ? Systolic murmur 097/9480 ? mild mitral insuff  ? Type 1 diabetes mellitus with diabetic retinopathy without macular edema (HCC)   ? dx at 61y/o, background retinopathy  ? ? ?Past Surgical History:  ?Procedure  Laterality Date  ? APPENDECTOMY    ? [place] ? COLONOSCOPY  07/2016  ? WNL ([place])  ? CORONARY ARTERY BYPASS GRAFT N/A 02/21/2020  ? Procedure: CORONARY ARTERY BYPASS GRAFTING (CABG) times five using left internal mammary and right leg saphenous vein;  Surgeon: LLajuana Matte MD;  [place]  Service: Open Heart Surgery;  Laterality: N/A;  ? ESOPHAGOGASTRODUODENOSCOPY  07/2016  ? LA Grade C reflux esophagitis, concern for stasis/dysmotility ([place])  ? LAPAROSCOPIC APPENDECTOMY N/A 09/01/2015  ? Procedure: APPENDECTOMY LAPAROSCOPIC;  Surgeon: JJudeth Horn MD;  [place]  Service: General;  Laterality: N/A;  ? LEFT HEART CATH AND CORONARY ANGIOGRAPHY N/A 02/17/2020  ? Procedure: LEFT HEART CATH AND CORONARY ANGIOGRAPHY;  Surgeon: GMinna Merritts MD;  [place];  Service: Cardiovascular;  Laterality: N/A;  ? LEFT HEART CATH AND CORS/GRAFTS ANGIOGRAPHY N/A 02/29/2020  ? early failure of 4/5 bypass grafts - medically managed (Angelena Form CAnnita Brod MD)  ? ORIF FEMUR FRACTURE Left 1996  ? Corrected by patient in history 2016  ? PARTIAL HYSTERECTOMY  2000  ? paps by WErling ConteOB/GYN [place]  ? TEE WITHOUT CARDIOVERSION N/A 02/21/2020  ? Procedure: TRANSESOPHAGEAL ECHOCARDIOGRAM (TEE);  Surgeon: LLajuana Matte MD;  [place]  Service:  Open Heart Surgery;  Laterality: N/A;  ? [place]  ? TRIGGER FINGER RELEASE Left 12/2010  ? thumb  ? ? ?Family History  ?Problem Relation Age of Onset  ? Diabetes Maternal Grandmother   ? Heart failure Maternal Grandmother   ? COPD Maternal Grandmother   ?     smoker  ? Thyroid disease Maternal Grandmother   ? Vascular Disease Maternal Grandmother   ? Colon cancer Maternal Grandfather   ? Colon polyps Mother   ? Breast cancer Other   ? CAD Brother 86  ?     stent  ? Esophageal cancer Neg Hx   ? Rectal cancer Neg Hx   ? Stomach cancer Neg Hx   ? ? ?Social History  ? ?Socioeconomic History  ?  Marital status: Married  ?  Spouse name: Not on file  ? Number of children: 1  ? Years of education: 52  ? Highest education level: Bachelor's degree (e.g., BA, AB, BS)  ?Occupational History  ? Occupation: HR Mudlogger  ?  Employer: OTHER  ?  Comment: [employer]  ?Tobacco Use  ? Smoking status: Never  ? Smokeless tobacco: Never  ?Vaping Use  ? Vaping Use: Never used  ?Substance and Sexual Activity  ? Alcohol use: No  ?  Alcohol/week: 0.0 standard drinks  ? Drug use: No  ? Sexual activity: Yes  ?  Birth control/protection: None  ?  Comment: Married  ?Other Topics Concern  ? Not on file  ?Social History Narrative  ? 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   ? Lives with husband in a 2 story home.  Has one daughter.  Education: college.  ?   ? 1 dog  ? ?Social Determinants of Health  ? ?Financial Resource Strain: Not on file  ?Food Insecurity: Not on file  ?Transportation Needs: Not on file  ?Physical Activity: Not on file  ?Stress: Not on file  ?Social Connections: Not on file  ?Intimate Partner Violence: Not on file  ? ? ?Outpatient Medications Prior to Visit  ?Medication Sig Dispense Refill  ? aspirin 81 MG EC tablet Take 1 tablet (81 mg total) by mouth daily.    ? Continuous Blood Gluc Sensor (DEXCOM G6 SENSOR) MISC USE AS INSTRUCTED TO CHECK BLOOD SUGAR CHANGE EVERY 10 DAYS 9 each 1  ? Continuous Blood Gluc Transmit (DEXCOM G6 TRANSMITTER) MISC USE AS INSTRUCTED CHANGE EVERY 90 DAYS 1 each 1  ? esomeprazole (NEXIUM) 40 MG capsule Take 1 capsule (40 mg total) by mouth daily. Patient needs office visit for further refills 90 capsule 0  ? Evolocumab (REPATHA SURECLICK) 250 MG/ML SOAJ Inject 140 mg into the skin every 14 (fourteen) days. 6 mL 3  ? glucagon (GLUCAGON EMERGENCY) 1 MG injection Inject 1 mg into the muscle once as needed (in case of severe hypoglycemia). 1 each 12  ? insulin regular (NOVOLIN R) 100 units/mL injection INJECT 6 UNITS TOTAL INTO THE SKIN 3 (THREE) TIMES DAILY BEFORE MEALS.  30 mL 0  ? Insulin Syringe-Needle U-100 (TECHLITE INSULIN SYRINGE) 31G X 15/64" 0.3 ML MISC Use up to 5 times daily to inject insulin daily 400 each 1  ? metoprolol succinate (TOPROL XL) 25 MG 24 hr tablet Take 1 tablet (25 mg total) by mouth daily. 90 tablet 3  ? Multiple Vitamin (MULTIVITAMIN) tablet Take 1 tablet by mouth daily.    ? nitroGLYCERIN (NITROSTAT) 0.4 MG SL tablet Place 1 tablet (0.4  mg total) under the tongue every 5 (five) minutes as needed for chest pain. 25 tablet 3  ? Insulin Disposable Pump (OMNIPOD 5 G6 INTRO, GEN 5,) KIT 1 each by Does not apply route as needed. (Patient not taking: Reported on 02/27/2022) 1 kit 0  ? Insulin Disposable Pump (OMNIPOD 5 G6 POD, GEN 5,) MISC 1 each by Does not apply route every 3 (three) days. (Patient not taking: Reported on 02/27/2022) 30 each 3  ? ?No facility-administered medications prior to visit.  ? ? ?Allergies  ?Allergen Reactions  ? Protonix [Pantoprazole] Swelling  ?  Facial Swelling  ? Toujeo Solostar [Insulin Glargine] Shortness Of Breath  ? Lactose Intolerance (Gi)   ?  GI  ? Magnesium-Containing Compounds Swelling and Other (See Comments)  ?  limbs  ? Adhesive [Tape] Rash and Other (See Comments)  ?  Band aids  ? ? ?ROS ?Review of Systems  ?Constitutional:  Positive for fatigue. Negative for chills and fever.  ?HENT:  Positive for congestion, postnasal drip, rhinorrhea and sinus pain (frontal). Negative for ear pain, sinus pressure and sore throat.   ?Respiratory:  Positive for cough (clear) and chest tightness. Negative for shortness of breath and wheezing.   ?Cardiovascular:  Negative for chest pain and palpitations.  ? ?  ?Objective:  ?  ?Physical Exam ?Constitutional:   ?   General: She is not in acute distress. ?   Appearance: Normal appearance. She is normal weight. She is not ill-appearing, toxic-appearing or diaphoretic.  ?HENT:  ?   Head: Normocephalic.  ?   Right Ear: Tympanic membrane normal.  ?   Left Ear: Tympanic membrane normal.  ?    Nose:  ?   Right Turbinates: Enlarged.  ?   Left Turbinates: Enlarged.  ?   Right Sinus: Maxillary sinus tenderness and frontal sinus tenderness present.  ?   Left Sinus: Maxillary sinus tenderness and fronta

## 2022-02-27 NOTE — Patient Instructions (Signed)
Start prescription of augmentin 875/125 mg and take as prescribed.  ?Recommend daily flonase.  ? ?Tylenol/ibuprofen ok for sinus pain as needed ?Increase oral fluids. Ok to continue with humidifers and hot steamy showers as discussed during visit. ? ?It was a pleasure speaking with you today, I hope you start feeling better soon. ? ?Regards,  ? ?Rozell Kettlewell ? ?

## 2022-03-01 ENCOUNTER — Other Ambulatory Visit: Payer: Self-pay | Admitting: Internal Medicine

## 2022-03-01 DIAGNOSIS — E103293 Type 1 diabetes mellitus with mild nonproliferative diabetic retinopathy without macular edema, bilateral: Secondary | ICD-10-CM

## 2022-03-01 MED ORDER — INSULIN REGULAR HUMAN 100 UNIT/ML IJ SOLN: INTRAMUSCULAR | 3 refills | Status: DC

## 2022-03-04 ENCOUNTER — Encounter: Payer: Self-pay | Admitting: Family

## 2022-03-04 DIAGNOSIS — B3731 Acute candidiasis of vulva and vagina: Secondary | ICD-10-CM

## 2022-03-05 ENCOUNTER — Encounter: Payer: Self-pay | Admitting: Family Medicine

## 2022-03-05 MED ORDER — FLUCONAZOLE 150 MG PO TABS: 150.0000 mg | ORAL_TABLET | Freq: Once | ORAL | 0 refills | Status: AC

## 2022-03-05 NOTE — Telephone Encounter (Signed)
Pt notified as instructed and pt voiced understanding. Pt said she would let [facility] know if not completely cleared after taking med.  ?

## 2022-03-05 NOTE — Telephone Encounter (Signed)
I spoke with pt and T Dugal FNP has sent in the fluconazole (diflucan) to pharmacy for pt and I notified pt by phone and pt will let [facility] know if symptoms do not clear after finishing med. Nothing further needed at this time. ?

## 2022-03-07 ENCOUNTER — Other Ambulatory Visit: Payer: Self-pay | Admitting: Family

## 2022-03-07 DIAGNOSIS — B3731 Acute candidiasis of vulva and vagina: Secondary | ICD-10-CM

## 2022-03-07 MED ORDER — FLUCONAZOLE 150 MG PO TABS: ORAL_TABLET | ORAL | 0 refills | Status: DC

## 2022-03-13 IMAGING — DX DG CHEST 1V PORT
1 series · 1 of 1 positions shown · non-contrast
Comparison: 02/22/2020

CLINICAL DATA: Bypass surgery.

EXAM:
PORTABLE CHEST 1 VIEW

[chest ap]
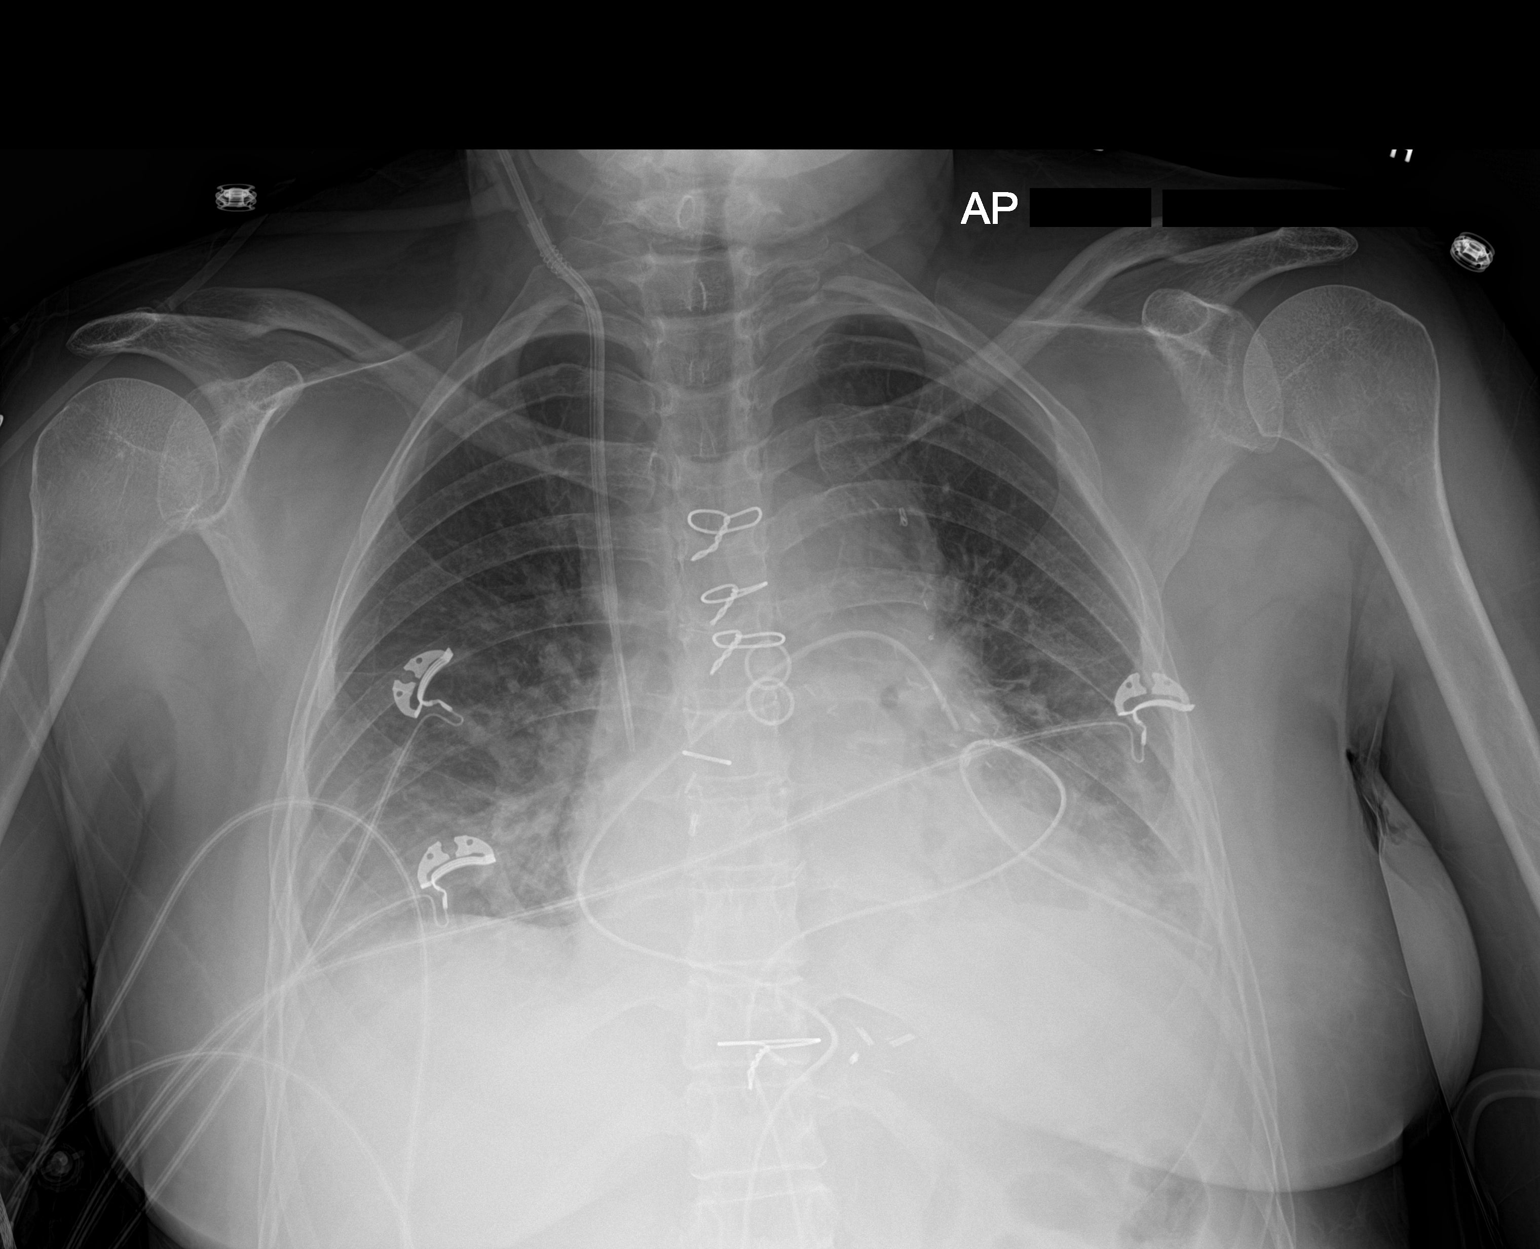

[1 of 1 positions shown; findings below may reference images not displayed]

FINDINGS: The right IJ catheter is stable. The left chest tube and mediastinal
drain tubes are stable. No pneumothorax.

Persistent bibasilar atelectasis but no pleural effusions or
pulmonary edema.
IMPRESSION: 1. Stable support apparatus.
2. Bibasilar atelectasis.

## 2022-03-14 DIAGNOSIS — N762 Acute vulvitis: Secondary | ICD-10-CM | POA: Diagnosis not present

## 2022-03-14 DIAGNOSIS — N898 Other specified noninflammatory disorders of vagina: Secondary | ICD-10-CM | POA: Diagnosis not present

## 2022-03-18 ENCOUNTER — Encounter: Payer: Self-pay | Admitting: Internal Medicine

## 2022-03-18 ENCOUNTER — Ambulatory Visit: Payer: BC Managed Care – PPO | Admitting: Internal Medicine

## 2022-03-18 VITALS — BP 110/76 | HR 59 | Ht 64.0 in | Wt 130.4 lb

## 2022-03-18 DIAGNOSIS — E785 Hyperlipidemia, unspecified: Secondary | ICD-10-CM | POA: Diagnosis not present

## 2022-03-18 DIAGNOSIS — E103293 Type 1 diabetes mellitus with mild nonproliferative diabetic retinopathy without macular edema, bilateral: Secondary | ICD-10-CM | POA: Diagnosis not present

## 2022-03-18 DIAGNOSIS — E1069 Type 1 diabetes mellitus with other specified complication: Secondary | ICD-10-CM

## 2022-03-18 LAB — POCT GLYCOSYLATED HEMOGLOBIN (HGB A1C): Hemoglobin A1C: 7.3 % — AB (ref 4.0–5.6)

## 2022-03-18 MED ORDER — INSULIN NPH (HUMAN) (ISOPHANE) 100 UNIT/ML ~~LOC~~ SUSP: 4.0000 [IU] | Freq: Every day | SUBCUTANEOUS | 11 refills | Status: DC

## 2022-03-18 MED ORDER — DEXCOM G7 SENSOR MISC: 3.0000 | 4 refills | Status: DC

## 2022-03-18 NOTE — Patient Instructions (Addendum)
Please add: ?- NPH insulin 4-5 units during the night ? ?Please change R insulin: ?B'fast: 5:1 (if going for a walk: 10:1) ?Lunch: 10:1 >> 15:1 ?Dinner: 20:1 >> 25:1 ?- target 150 ?- insulin sensitivity factor (ISF) 60: ?150-200: + 1 unit ?201-260: + 2 units ?261-320: + 3 units ?321-380: + 4 units ?>380: + 5 units ?Do not correct bedtime sugars <300, and only inject 1 unit then.  ?For the snack at night, do not take more than 1-2 units of regular insulin! ? ?Please return in 3 months. ? ?

## 2022-03-18 NOTE — Progress Notes (Signed)
?Patient ID: Felicia [name], female   DOB: Mar 31, 1961, 61 y.o.   MRN: [id number] ? ?This visit occurred during the SARS-CoV-2 public health emergency.  Safety protocols were in place, including screening questions prior to the visit, additional usage of staff PPE, and extensive cleaning of exam room while observing appropriate contact time as indicated for disinfecting solutions.  ? ?HPI: ?Felicia [name] is a 61 y.o.-year-old female, presenting for follow-up for DM1, dx in 90 (age 26), uncontrolled, with complications (CAD - s/p 5v CABG, mild retinopathy, gastroparesis). Last visit 3.5 months ago. ? ?Interim hx: ?No increased urination, blurry vision, nausea, chest pain. ?She has recurrent vaginal candidiasis. ?Since last visit she started on OmniPod 5 insulin pump. However, she stopped this as she did not feel it was helping her.  Her sugars stayed very high.  She actually stopped this approximately 1.5 months ago. ?As of now she is only on regular insulin regimen, without long-acting insulin! ? ?Insulin pump: ?-  >> stopped 02/2022 ? ?CGM: ?- Dexcom G6 ? ?Reviewed HbA1c levels: ?Lab Results  ?Component Value Date  ? HGBA1C 6.9 (A) 11/30/2021  ? HGBA1C 7.6 (H) 10/05/2021  ? HGBA1C 7.9 (H) 06/11/2021  ? ?She was previously on insulin pump for 5 years (~2010) but she did not like it.  Sugars were not better on this.  She is not interested in getting back on the pump.   ? ?She was on: ?- Tresiba 12 units in am >> ... >> Lantus 4 units in am and 5-6 units in pm >> 6 >> 7-8 units at night ?- R insulin - take the insulin at the end of the meal or try 30 min after the meal - try first just with lunch, then the other meals ?- insulin to carb ratio (ICR)  ?B'fast: 5:1 (if going for a walk: 10:1) ?Lunch: 10:1  ?Dinner: 20:1  ?- target 150 ?- insulin sensitivity factor (ISF) 60: ?150-200: + 1 unit ?201-260: + 2 units ?261-320: + 3 units ?321-380: + 4 units ?>380: + 5 units ?Do not correct bedtime sugars <300, and only inject  1 unit then.  ?For the snack at night, do not take more than 1-2 units of regular insulin! ? ?We tried Metformin ER 1000 mg with dinner but stopped due to lack of effect. ?Junie Spencer >> she did not feel that this worked as well as Lantus. ?Tried Toujeo >> allergy: CP, SOB. ? ?Then on insulin pump >> now on: ?- R insulin - at the start of the meal or correction- 4-8 units ?- insulin to carb ratio (ICR)  ?B'fast: 5:1 (if going for a walk: 10:1) ?Lunch: 10:1  ?Dinner: 20:1  ?- target 150 ?- insulin sensitivity factor (ISF) 60: ?150-200: + 1 unit ?201-260: + 2 units ?261-320: + 3 units ?321-380: + 4 units ?>380: + 5 units ?Do not correct bedtime sugars <300, and only inject 1 unit then.  ?For the snack at night, do not take more than 1-2 units of regular insulin! ? ?Meter: ReliOn ? ?She checks her blood sugars more than 4 times a day with her Dexcom CGM: ? ? ?Previously: ? ? ?Previously: ? ? ?Lowest sugar was 38 >>... 45 >> 55 >> 50s; + hypoglycemia awareness in the 70s.  No recent hyper or hypoglycemia admissions.  She has a glucagon kit at home. ?Highest sugar was 500s (bagel)  ... >> 300s>> 300s.  ? ?Pt's meals are: ?- Breakfast: protein bar + fruit >> granola bar (  25 g carbs) ?- 10 am snack  ?- Lunch: apple + cheese, carrots ?- Snack bar: 15g carbs - Glucerna ?- Dinner: meat + 1/2 backed potato + veggies/salad ?- Snacks: popcorn, salty snacks: Potato chips ? ?-No CKD, last BUN/creatinine:  ?Lab Results  ?Component Value Date  ? BUN 14 10/05/2021  ? CREATININE 0.99 10/05/2021  ?Previously on quinapril, now off. ? ?+ HL; last set of lipids: ?Lab Results  ?Component Value Date  ? CHOL 132 10/05/2021  ? HDL 46.00 10/05/2021  ? LDLCALC 49 10/05/2021  ? LDLDIRECT 120.0 06/17/2019  ? TRIG 186.0 (H) 10/05/2021  ? CHOLHDL 3 10/05/2021  ?She tried pravastatin, lovastatin, Zetia, even adding co-Q10 but she still had muscle cramps >>  now on Repatha, tolerated well. She also uses uses 2 creams: Theraworx + Muscle calm.    ? ?- last eye exam was on 09/17/2021: + DR ? ?- no numbness and tingling in her feet. Last foot exam: 11/2021. ? ?On ASA 81. ? ?Latest TSH is normal: ?Lab Results  ?Component Value Date  ? TSH 2.85 06/11/2021  ? ?She also has a history of HTN. ? ?ROS: ?+ See HPI ? ?I reviewed pt's medications, allergies, PMH, social hx, family hx, and changes were documented in the history of present illness. Otherwise, unchanged from my initial visit note. ? ?Past Medical History:  ?Diagnosis Date  ? Allergy   ? Anemia   ? past history long ago  ? Borderline HTN (hypertension)   ? CAD (coronary artery disease)   ? a. 02/2020 Cath: LM nl, LAD large, 90p, 71m D1 90p, LCX nl, OM 60-70p, RCA dominant w/ dampening upon engagement of ostium. 70p/m, RPDA 50. EF 55-65%.  ? COVID-19 virus infection 02/2021  ? Diabetes ([place]   ? Disorder of kidney   ? has abd kidney (right)  ? GERD (gastroesophageal reflux disease)   ? History of diabetic gastroparesis   ? History of echocardiogram   ? a. 01/2013 Echo: EF 55-65%, no rwma. Mild MR. Nl RV fxn.  ? HLD (hyperlipidemia)   ? Hx: UTI (urinary tract infection)   ? IBS (irritable bowel syndrome)   ? Nephrolithiasis 2005  ? Osteopenia 2012  ? per pt  ? Reflux esophagitis   ? Systolic murmur 028/3151 ? mild mitral insuff  ? Type 1 diabetes mellitus with diabetic retinopathy without macular edema (HCC)   ? dx at 61y/o, background retinopathy  ? ?Past Surgical History:  ?Procedure Laterality Date  ? [place]CTOMY    ? [place] ? COLONOSCOPY  07/2016  ? WNL ([place])  ? CORONARY ARTERY BYPASS GRAFT N/A 02/21/2020  ? Procedure: CORONARY ARTERY BYPASS GRAFTING (CABG) times five using left internal mammary and right leg saphenous vein;  Surgeon: LLajuana Matte MD;  [place] [facility]  Service: Open Heart Surgery;  Laterality: N/A;  ? ESOPHAGOGASTRODUODENOSCOPY  07/2016  ? LA Grade C reflux esophagitis, concern for stasis/dysmotility ([place])  ? LAPAROSCOPIC APPENDECTOMY N/A 09/01/2015  ?  Procedure: APPENDECTOMY LAPAROSCOPIC;  Surgeon: JJudeth Horn MD;  [place] [facility]  Service: General;  Laterality: N/A;  ? LEFT HEART CATH AND CORONARY ANGIOGRAPHY N/A 02/17/2020  ? Procedure: LEFT HEART CATH AND CORONARY ANGIOGRAPHY;  Surgeon: GMinna Merritts MD;  [facility];  Service: Cardiovascular;  Laterality: N/A;  ? LEFT HEART CATH AND CORS/GRAFTS ANGIOGRAPHY N/A 02/29/2020  ? early failure of 4/5 bypass grafts - medically managed (Angelena Form CAnnita Brod MD)  ?  ORIF FEMUR FRACTURE Left 1996  ? Corrected by patient in history 2016  ? PARTIAL HYSTERECTOMY  2000  ? paps by Erling Conte OB/GY[facility] [place]  ? TEE WITHOUT CARDIOVERSION N/A 02/21/2020  ? Procedure: TRANSESOPHAGEAL ECHOCARDIOGRAM (TEE);  Surgeon: Lajuana Matte, MD;  Location: [place];  Service: Open Heart Surgery;  Laterality: N/A;  ? [place]  ? TRIGGER FINGER RELEASE Left 12/2010  ? thumb  ? ?Social History  ? ?Socioeconomic History  ? Marital status: Married  ?  Spouse name: Not on file  ? Number of children: 1  ? Years of education: 70  ? Highest education level: Bachelor's degree (e.g., BA, AB, BS)  ?Occupational History  ? Occupation: HR Mudlogger  ?  Employer: OTHER  ?  Comment: [employer]  ?Tobacco Use  ? Smoking status: Never  ? Smokeless tobacco: Never  ?Vaping Use  ? Vaping Use: Never used  ?Substance and Sexual Activity  ? Alcohol use: No  ?  Alcohol/week: 0.0 standard drinks  ? Drug use: No  ? Sexual activity: Yes  ?  Birth control/protection: None  ?  Comment: Married  ?Other Topics Concern  ? Not on file  ?Social History Narrative  ? 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   ? Lives with husband in a 2 story home.  Has one daughter.  Education: college.  ?   ? 1 dog  ? ?Social Determinants of Health  ? ?Financial Resource Strain: Not on file  ?Food Insecurity: Not on file  ?Transportation Needs: Not on file  ?Physical Activity: Not on file  ?Stress: Not on file  ?Social  Connections: Not on file  ?Intimate Partner Violence: Not on file  ? ?Current Outpatient Medications on File Prior to Visit  ?Medication Sig Dispense Refill  ? aspirin 81 MG EC tablet Take 1 tablet (81 m

## 2022-03-21 ENCOUNTER — Encounter: Payer: BC Managed Care – PPO | Admitting: Internal Medicine

## 2022-03-26 ENCOUNTER — Encounter: Payer: Self-pay | Admitting: Internal Medicine

## 2022-03-26 DIAGNOSIS — E103293 Type 1 diabetes mellitus with mild nonproliferative diabetic retinopathy without macular edema, bilateral: Secondary | ICD-10-CM

## 2022-03-26 MED ORDER — INSULIN NPH (HUMAN) (ISOPHANE) 100 UNIT/ML ~~LOC~~ SUSP: 4.0000 [IU] | Freq: Every day | SUBCUTANEOUS | 1 refills | Status: DC

## 2022-04-04 ENCOUNTER — Ambulatory Visit: Payer: BC Managed Care – PPO | Admitting: Internal Medicine

## 2022-04-17 ENCOUNTER — Ambulatory Visit (AMBULATORY_SURGERY_CENTER): Payer: BC Managed Care – PPO | Admitting: Internal Medicine

## 2022-04-17 ENCOUNTER — Encounter: Payer: Self-pay | Admitting: Internal Medicine

## 2022-04-17 VITALS — BP 133/56 | HR 60 | Temp 98.2°F | Resp 13 | Ht 64.0 in | Wt 133.0 lb

## 2022-04-17 DIAGNOSIS — R1310 Dysphagia, unspecified: Secondary | ICD-10-CM | POA: Diagnosis not present

## 2022-04-17 DIAGNOSIS — K219 Gastro-esophageal reflux disease without esophagitis: Secondary | ICD-10-CM

## 2022-04-17 MED ORDER — SODIUM CHLORIDE 0.9 % IV SOLN: 500.0000 mL | Freq: Once | INTRAVENOUS | Status: DC

## 2022-04-17 NOTE — Progress Notes (Signed)
GASTROENTEROLOGY PROCEDURE H&P NOTE   Primary Care Physician: Ria Bush, MD    Reason for Procedure:  Dysphagia  Plan:    Upper endoscopy with probable dilation  Patient is appropriate for endoscopic procedure(s) in the ambulatory ([facility]) setting.  The nature of the procedure, as well as the risks, benefits, and alternatives were carefully and thoroughly reviewed with the patient. Ample time for discussion and questions allowed. The patient understood, was satisfied, and agreed to proceed.     HPI: Felicia [name] is a 61 y.o. female who presents for EGD.  Medical history as below.   No recent chest pain or shortness of breath.  No abdominal pain today.  Past Medical History:  Diagnosis Date   Allergy    Anemia    past history long ago   Borderline HTN (hypertension)    CAD (coronary artery disease)    a. 02/2020 Cath: LM nl, LAD large, 90p, 55m, D1 90p, LCX nl, OM 60-70p, RCA dominant w/ dampening upon engagement of ostium. 70p/m, RPDA 50. EF 55-65%.   COVID-19 virus infection 02/2021   Diabetes ([place])    Disorder of kidney    has abd kidney (right)   GERD (gastroesophageal reflux disease)    History of diabetic gastroparesis    History of echocardiogram    a. 01/2013 Echo: EF 55-65%, no rwma. Mild MR. Nl RV fxn.   HLD (hyperlipidemia)    Hx: UTI (urinary tract infection)    IBS (irritable bowel syndrome)    Nephrolithiasis 2005   Osteopenia 2012   per pt   Reflux esophagitis    Systolic murmur [id number]   mild mitral insuff   Type 1 diabetes mellitus with diabetic retinopathy without macular edema (HCC)    dx at 61 y/o, background retinopathy    Past Surgical History:  Procedure Laterality Date   APPENDECTOMY     CESAREAN SECTION  1985   COLONOSCOPY  07/2016   WNL (Sylvain Hasten)   CORONARY ARTERY BYPASS GRAFT N/A 02/21/2020   Procedure: CORONARY ARTERY BYPASS GRAFTING (CABG) times five using left internal mammary and right leg saphenous vein;  Surgeon:  Lajuana Matte, MD;  Location: [facility];  Service: Open Heart Surgery;  Laterality: N/A;   ESOPHAGOGASTRODUODENOSCOPY  07/2016   LA Grade C reflux esophagitis, concern for stasis/dysmotility ([place] Neels)   LAPAROSCOPIC APPENDECTOMY N/A 09/01/2015   Procedure: APPENDECTOMY LAPAROSCOPIC;  Surgeon: Judeth Horn, MD;  Location: [place] [facility];  Service: General;  Laterality: N/A;   LEFT HEART CATH AND CORONARY ANGIOGRAPHY N/A 02/17/2020   Procedure: LEFT HEART CATH AND CORONARY ANGIOGRAPHY;  Surgeon: Minna Merritts, MD;  Location: [facility];  Service: Cardiovascular;  Laterality: N/A;   LEFT HEART CATH AND CORS/GRAFTS ANGIOGRAPHY N/A 02/29/2020   early failure of 4/5 bypass grafts - medically managed Angelena Form, Annita Brod, MD)   ORIF FEMUR FRACTURE Left 1996   Corrected by patient in history 2016   PARTIAL HYSTERECTOMY  2000   paps by Erling Conte OB/GY[facility] [place]   TEE WITHOUT CARDIOVERSION N/A 02/21/2020   Procedure: TRANSESOPHAGEAL ECHOCARDIOGRAM (TEE);  Surgeon: Lajuana Matte, MD;  Location: [facility];  Service: Open Heart Surgery;  Laterality: N/A;   TONSILLECTOMY AND ADENOIDECTOMY  1975   TRIGGER FINGER RELEASE Left 12/2010   thumb    Prior to Admission medications   Medication Sig Start Date End Date Taking? Authorizing Provider  aspirin 81 MG EC tablet Take 1 tablet (81 mg total) by mouth daily. 05/19/20  Yes  Croitoru, Mihai, MD  Continuous Blood Gluc Sensor (DEXCOM G7 SENSOR) MISC 3 each by Does not apply route every 30 (thirty) days. Apply 1 sensor every 10 days 03/18/22  Yes Philemon Kingdom, MD  esomeprazole (NEXIUM) 40 MG capsule Take 1 capsule (40 mg total) by mouth daily. Patient needs office visit for further refills 04/26/19  Yes Pricella Gaugh, Lajuan Lines, MD  insulin NPH Human (NOVOLIN N) 100 UNIT/ML injection Inject 0.04-0.06 mLs (4-6 Units total) into the skin daily before breakfast. 03/26/22  Yes Philemon Kingdom, MD  insulin regular (NOVOLIN R) 100 units/mL injection INJECT 6 UNITS  TOTAL INTO THE SKIN 3 (THREE) TIMES DAILY BEFORE MEALS. 03/01/22  Yes Philemon Kingdom, MD  Insulin Syringe-Needle U-100 (TECHLITE INSULIN SYRINGE) 31G X 15/64" 0.3 ML MISC Use up to 5 times daily to inject insulin daily 12/13/21  Yes Philemon Kingdom, MD  metoprolol succinate (TOPROL XL) 25 MG 24 hr tablet Take 1 tablet (25 mg total) by mouth daily. 12/12/21  Yes Croitoru, Mihai, MD  Multiple Vitamin (MULTIVITAMIN) tablet Take 1 tablet by mouth daily.   Yes [provider]  Evolocumab (REPATHA SURECLICK) XX123456 MG/ML SOAJ Inject 140 mg into the skin every 14 (fourteen) days. 04/04/21   Croitoru, Mihai, MD  glucagon (GLUCAGON EMERGENCY) 1 MG injection Inject 1 mg into the muscle once as needed (in case of severe hypoglycemia). 09/04/18   Philemon Kingdom, MD  nitroGLYCERIN (NITROSTAT) 0.4 MG SL tablet Place 1 tablet (0.4 mg total) under the tongue every 5 (five) minutes as needed for chest pain. 02/01/21 05/02/21  Croitoru, Mihai, MD    Current Outpatient Medications  Medication Sig Dispense Refill   aspirin 81 MG EC tablet Take 1 tablet (81 mg total) by mouth daily.     Continuous Blood Gluc Sensor (DEXCOM G7 SENSOR) MISC 3 each by Does not apply route every 30 (thirty) days. Apply 1 sensor every 10 days 9 each 4   esomeprazole (NEXIUM) 40 MG capsule Take 1 capsule (40 mg total) by mouth daily. Patient needs office visit for further refills 90 capsule 0   insulin NPH Human (NOVOLIN N) 100 UNIT/ML injection Inject 0.04-0.06 mLs (4-6 Units total) into the skin daily before breakfast. 9 mL 1   insulin regular (NOVOLIN R) 100 units/mL injection INJECT 6 UNITS TOTAL INTO THE SKIN 3 (THREE) TIMES DAILY BEFORE MEALS. 30 mL 3   Insulin Syringe-Needle U-100 (TECHLITE INSULIN SYRINGE) 31G X 15/64" 0.3 ML MISC Use up to 5 times daily to inject insulin daily 400 each 1   metoprolol succinate (TOPROL XL) 25 MG 24 hr tablet Take 1 tablet (25 mg total) by mouth daily. 90 tablet 3   Multiple Vitamin (MULTIVITAMIN)  tablet Take 1 tablet by mouth daily.     Evolocumab (REPATHA SURECLICK) [id number] MG/ML SOAJ Inject 140 mg into the skin every 14 (fourteen) days. 6 mL 3   glucagon (GLUCAGON EMERGENCY) 1 MG injection Inject 1 mg into the muscle once as needed (in case of severe hypoglycemia). 1 each 12   nitroGLYCERIN (NITROSTAT) 0.4 MG SL tablet Place 1 tablet (0.4 mg total) under the tongue every 5 (five) minutes as needed for chest pain. 25 tablet 3   Current Facility-Administered Medications  Medication Dose Route Frequency Provider Last Rate Last Admin   0.9 %  sodium chloride infusion  500 mL Intravenous Once Jocilyn Trego, Lajuan Lines, MD        Allergies as of 04/17/2022 - Review Complete 04/17/2022  Allergen Reaction Noted   Protonix [  pantoprazole] Swelling 04/16/2017   Toujeo solostar [insulin glargine] Shortness Of Breath 04/21/2015   Lactose intolerance (gi)  06/15/2013   Magnesium-containing compounds Swelling and Other (See Comments) 09/14/2015   Adhesive [tape] Rash and Other (See Comments) 02/17/2020    Family History  Problem Relation Age of Onset   Diabetes Maternal Grandmother    Heart failure Maternal Grandmother    COPD Maternal Grandmother        smoker   Thyroid disease Maternal Grandmother    Vascular Disease Maternal Grandmother    Colon cancer Maternal Grandfather    Colon polyps Mother    Breast cancer Other    CAD Brother 59       stent   Esophageal cancer Neg Hx    Rectal cancer Neg Hx    Stomach cancer Neg Hx     Social History   Socioeconomic History   Marital status: Married    Spouse name: Not on file   Number of children: 1   Years of education: 16   Highest education level: Bachelor's degree (e.g., BA, AB, BS)  Occupational History   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  Has one daughter.  Education: college.      1 dog   Social Determinants of Radio broadcast assistant Strain: Not on file  Food Insecurity: Not on file  Transportation Needs: Not on file  Physical Activity: Not on file  Stress: Not on file  Social Connections: Not on file  Intimate Partner Violence: Not on file    Physical Exam: Vital signs in last 24 hours: @BP  (!) 175/85   Pulse 68   Temp 98.2 F (36.8 C)   Ht 5\' 4"  (1.626 m)   Wt 133 lb (60.3 kg)   SpO2 100%   BMI 22.83 kg/m  GEN: NAD EYE: Sclerae anicteric ENT: MMM CV: Non-tachycardic Pulm: CTA b/l GI: Soft, NT/ND NEURO:  Alert & Oriented x 3   Zenovia Jarred, MD [facility]  04/17/2022 8:37 AM

## 2022-04-17 NOTE — Op Note (Signed)
[facility] Patient Name: Felicia [name] Procedure Date: 04/17/2022 8:33 AM MRN: [id number] Endoscopist: Jerene Bears , MD Age: 61 Referring MD:  Date of Birth: Jan 27, 1961 Gender: Female Account #: [id number] Procedure:                Upper GI endoscopy Indications:              Dysphagia, Gastro-esophageal reflux disease with hx                            of esophagitis seen by EGD in Sept 2017 Medicines:                Monitored Anesthesia Care Procedure:                Pre-Anesthesia Assessment:                           - Prior to the procedure, a History and Physical                            was performed, and patient medications and                            allergies were reviewed. The patient's tolerance of                            previous anesthesia was also reviewed. The risks                            and benefits of the procedure and the sedation                            options and risks were discussed with the patient.                            All questions were answered, and informed consent                            was obtained. Prior Anticoagulants: The patient has                            taken no previous anticoagulant or antiplatelet                            agents. ASA Grade Assessment: III - A patient with                            severe systemic disease. After reviewing the risks                            and benefits, the patient was deemed in                            satisfactory condition to undergo the procedure.  After obtaining informed consent, the endoscope was                            passed under direct vision. Throughout the                            procedure, the patient's blood pressure, pulse, and                            oxygen saturations were monitored continuously. The                            Endoscope was introduced through the mouth, and                            advanced to the  second part of duodenum. The upper                            GI endoscopy was accomplished without difficulty.                            The patient tolerated the procedure well. Scope In: Scope Out: Findings:                 The examined esophagus was normal.                           No endoscopic abnormality was evident in the                            esophagus to explain the patient's complaint of                            dysphagia. It was decided, however, to proceed with                            dilation of the entire esophagus. The scope was                            withdrawn. Dilation was performed with a Maloney                            dilator with mild resistance at 51 Fr and 54 Fr.                           A large amount of food (residue) was found in the                            gastric body and in the gastric antrum. This                            significantly limited visualization of gastric  mucosa.                           The examined duodenum was normal where not limited                            by food residue. Complications:            No immediate complications. Estimated Blood Loss:     Estimated blood loss: none. Impression:               - Normal esophagus.                           - No endoscopic esophageal abnormality to explain                            patient's dysphagia. Esophagus dilated to 54 Fr                            with Maloney.                           - A large amount of food (residue) in the stomach.                            Consistent with gastroparesis.                           - Normal examined duodenum.                           - No specimens collected. Recommendation:           - Patient has a contact number available for                            emergencies. The signs and symptoms of potential                            delayed complications were discussed with the                             patient. Return to normal activities tomorrow.                            Written discharge instructions were provided to the                            patient.                           - Advance diet as tolerated.                           - Continue present medications.                           -  Repeat upper endoscopy PRN for retreatment. If                            persistent dysphagia then evaluate esophageal                            motility. Jerene Bears, MD 04/17/2022 8:58:50 AM This report has been signed electronically.

## 2022-04-17 NOTE — Patient Instructions (Signed)
Resume previous medications.  Resume diet as tolerated.  Handouts on findings given to patient.      YOU HAD AN ENDOSCOPIC PROCEDURE TODAY AT THE [facility]:   Refer to the procedure report that was given to you for any specific questions about what was found during the examination.  If the procedure report does not answer your questions, please call your gastroenterologist to clarify.  If you requested that your care partner not be given the details of your procedure findings, then the procedure report has been included in a sealed envelope for you to review at your convenience later.  YOU SHOULD EXPECT: Some feelings of bloating in the abdomen. Passage of more gas than usual.  Walking can help get rid of the air that was put into your GI tract during the procedure and reduce the bloating. If you had a lower endoscopy (such as a colonoscopy or flexible sigmoidoscopy) you may notice spotting of blood in your stool or on the toilet paper. If you underwent a bowel prep for your procedure, you may not have a normal bowel movement for a few days.  Please Note:  You might notice some irritation and congestion in your nose or some drainage.  This is from the oxygen used during your procedure.  There is no need for concern and it should clear up in a day or so.  SYMPTOMS TO REPORT IMMEDIATELY:   Following upper endoscopy (EGD)  Vomiting of blood or coffee ground material  New chest pain or pain under the shoulder blades  Painful or persistently difficult swallowing  New shortness of breath  Fever of 100F or higher  Black, tarry-looking stools  For urgent or emergent issues, a gastroenterologist can be reached at any hour by calling [phone]. Do not use MyChart messaging for urgent concerns.    DIET:  We do recommend a small meal at first, but then you may proceed to your regular diet.  Drink plenty of fluids but you should avoid alcoholic beverages for 24 hours.  ACTIVITY:   You should plan to take it easy for the rest of today and you should NOT DRIVE or use heavy machinery until tomorrow (because of the sedation medicines used during the test).    FOLLOW UP: Our staff will call the number listed on your records 24-72 hours following your procedure to check on you and address any questions or concerns that you may have regarding the information given to you following your procedure. If we do not reach you, we will leave a message.  We will attempt to reach you two times.  During this call, we will ask if you have developed any symptoms of COVID 19. If you develop any symptoms (ie: fever, flu-like symptoms, shortness of breath, cough etc.) before then, please call [phone].  If you test positive for Covid 19 in the 2 weeks post procedure, please call and report this information to [place].    If any biopsies were taken you will be contacted by phone or by letter within the next 1-3 weeks.  Please call us at [phone] if you have not heard about the biopsies in 3 weeks.    SIGNATURES/CONFIDENTIALITY: You and/or your care partner have signed paperwork which will be entered into your electronic medical record.  These signatures attest to the fact that that the information above on your After Visit Summary has been reviewed and is understood.  Full responsibility of the confidentiality of this discharge information  lies with you and/or your care-partner.  

## 2022-04-17 NOTE — Progress Notes (Signed)
To pacu, VSS. Report to [email or web address] 

## 2022-04-17 NOTE — Progress Notes (Signed)
Called to room to assist during endoscopic procedure.  Patient ID and intended procedure confirmed with present staff. Received instructions for my participation in the procedure from the performing physician.  

## 2022-04-18 ENCOUNTER — Telehealth: Payer: Self-pay

## 2022-04-18 NOTE — Telephone Encounter (Signed)
  Follow up Call-     04/17/2022    7:38 AM  Call back number  Post procedure Call Back phone  # [phone]  Permission to leave phone message Yes     Patient questions:  Do you have a fever, pain , or abdominal swelling? No. Pain Score  0 *  Have you tolerated food without any problems? Yes.    Have you been able to return to your normal activities? Yes.    Do you have any questions about your discharge instructions: Diet   No. Medications  No. Follow up visit  No.  Do you have questions or concerns about your Care? No.  Actions: * If pain score is 4 or above: No action needed, pain <4.

## 2022-04-19 DIAGNOSIS — Z90710 Acquired absence of both cervix and uterus: Secondary | ICD-10-CM | POA: Insufficient documentation

## 2022-04-30 ENCOUNTER — Other Ambulatory Visit (HOSPITAL_COMMUNITY): Payer: Self-pay

## 2022-04-30 ENCOUNTER — Telehealth: Payer: Self-pay | Admitting: Pharmacy Technician

## 2022-05-16 ENCOUNTER — Other Ambulatory Visit: Payer: Self-pay | Admitting: Internal Medicine

## 2022-05-16 DIAGNOSIS — E103293 Type 1 diabetes mellitus with mild nonproliferative diabetic retinopathy without macular edema, bilateral: Secondary | ICD-10-CM

## 2022-05-20 ENCOUNTER — Encounter: Payer: Self-pay | Admitting: Internal Medicine

## 2022-05-22 ENCOUNTER — Other Ambulatory Visit: Payer: Self-pay

## 2022-05-22 DIAGNOSIS — R1310 Dysphagia, unspecified: Secondary | ICD-10-CM

## 2022-05-22 NOTE — Telephone Encounter (Signed)
Pls let pt know I got her message Proceed with barium esophagram with tablet Dysphagia despite esophageal dilation, mano may be needed depending on results of barium esophagram

## 2022-05-25 ENCOUNTER — Other Ambulatory Visit: Payer: Self-pay | Admitting: Family Medicine

## 2022-05-28 NOTE — Telephone Encounter (Signed)
Left a message on voicemail for patient to call the office back. This medication Singulair was discontinued 07/30/22 . Need to find out if patient requested this medication. Medication is no longer on med list.

## 2022-05-29 NOTE — Telephone Encounter (Signed)
Patient called and stated that she is taking the Singulair medication still.

## 2022-05-29 NOTE — Telephone Encounter (Signed)
ERx 

## 2022-05-29 NOTE — Telephone Encounter (Signed)
Singulair was discontinued from her medication list. Patient called back stating that she is taking the medication and requested that a refill be sent in. Last office visit 02/27/22 acute Upcoming appointment 08/20/22

## 2022-06-10 DIAGNOSIS — Z6823 Body mass index (BMI) 23.0-23.9, adult: Secondary | ICD-10-CM | POA: Diagnosis not present

## 2022-06-10 DIAGNOSIS — Z1231 Encounter for screening mammogram for malignant neoplasm of breast: Secondary | ICD-10-CM | POA: Diagnosis not present

## 2022-06-10 DIAGNOSIS — Z01419 Encounter for gynecological examination (general) (routine) without abnormal findings: Secondary | ICD-10-CM | POA: Diagnosis not present

## 2022-06-10 LAB — HM MAMMOGRAPHY

## 2022-06-14 ENCOUNTER — Ambulatory Visit (HOSPITAL_COMMUNITY): Payer: BC Managed Care – PPO

## 2022-06-14 DIAGNOSIS — M65341 Trigger finger, right ring finger: Secondary | ICD-10-CM | POA: Diagnosis not present

## 2022-06-15 ENCOUNTER — Other Ambulatory Visit: Payer: Self-pay | Admitting: Cardiovascular Disease

## 2022-06-28 ENCOUNTER — Other Ambulatory Visit (HOSPITAL_COMMUNITY): Payer: BC Managed Care – PPO

## 2022-06-28 ENCOUNTER — Encounter: Payer: Self-pay | Admitting: Internal Medicine

## 2022-06-28 ENCOUNTER — Telehealth: Payer: Self-pay | Admitting: Internal Medicine

## 2022-06-28 ENCOUNTER — Encounter (HOSPITAL_COMMUNITY): Payer: Self-pay

## 2022-06-28 ENCOUNTER — Ambulatory Visit: Payer: BC Managed Care – PPO | Admitting: Internal Medicine

## 2022-06-28 VITALS — BP 130/70 | HR 63 | Ht 64.0 in | Wt 133.0 lb

## 2022-06-28 DIAGNOSIS — E785 Hyperlipidemia, unspecified: Secondary | ICD-10-CM | POA: Diagnosis not present

## 2022-06-28 DIAGNOSIS — E1069 Type 1 diabetes mellitus with other specified complication: Secondary | ICD-10-CM

## 2022-06-28 DIAGNOSIS — E103293 Type 1 diabetes mellitus with mild nonproliferative diabetic retinopathy without macular edema, bilateral: Secondary | ICD-10-CM

## 2022-06-28 LAB — POCT GLYCOSYLATED HEMOGLOBIN (HGB A1C): Hemoglobin A1C: 7.1 % — AB (ref 4.0–5.6)

## 2022-06-28 MED ORDER — GLUCAGON 3 MG/DOSE NA POWD: 3.0000 mg | Freq: Once | NASAL | 11 refills | Status: AC | PRN

## 2022-06-28 NOTE — Progress Notes (Signed)
Patient ID: Felicia [name], female   DOB: 01-26-1961, 61 y.o.   MRN: [id number]  HPI: Felicia [name] is a 61 y.o.-year-old female, presenting for follow-up for DM1, dx in 68 (age 69), uncontrolled, with complications (CAD - s/p 5v CABG, mild retinopathy, gastroparesis). Last visit 3 months ago.  Interim hx: No increased urination, blurry vision, nausea, chest pain. She has recurrent vaginal candidiasis. 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  Insulin pump: -  >> stopped 02/2022  CGM: - Dexcom G6 >> G7 - she likes this  Reviewed HbA1c levels: Lab Results  Component Value Date   HGBA1C 7.3 (A) 03/18/2022   HGBA1C 6.9 (A) 11/30/2021   HGBA1C 7.6 (H) 10/05/2021   She was previously on insulin pump for 5 years (~2010) but she did not like it.  Sugars were not better on this.     She was on: - Tresiba 12 units in am >> ... >> Lantus 4 units in am and 5-6 units in pm >> 6 >> 7-8 units at night - R insulin - take the insulin at the end of the meal or try 30 min after the meal - try first just with lunch, then the other meals - insulin to carb ratio (ICR)  B'fast: 5:1 (if going for a walk: 10:1) Lunch: 10:1  Dinner: 20:1  - target 150 - insulin sensitivity factor (ISF) 60: 150-200: + 1 unit 201-260: + 2 units 261-320: + 3 units 321-380: + 4 units >380: + 5 units Do not correct bedtime sugars <300, and only inject 1 unit then.  For the snack at night, do not take more than 1-2 units of regular insulin!  We tried Metformin ER 1000 mg with dinner but stopped due to lack of effect. Tried [place] >> she did not feel that this worked as well as Lantus. Tried Toujeo >> allergy: CP, SOB.  She was then on an OmniPod 5 insulin pump but felt her sugars were higher >> stopped 02/2022.  Currently on: - NPH 4  units at night (1-2 am) -added 03/2022 - R insulin - at the start of the meal or correction- 4-8 units - insulin to carb ratio (ICR)  B'fast: 5:1  (if going for a walk: 10:1) Lunch: 15:1 Dinner: 25:1 - target 150 - insulin sensitivity factor (ISF) 60: 150-200: + 1 unit 201-260: + 2 units 261-320: + 3 units 321-380: + 4 units >380: + 5 units Do not correct bedtime sugars <300, and only inject 1 unit then.  For the snack at night, do not take more than 1-2 units of regular insulin!  Meter: ReliOn  She checks her blood sugars more than 4 times a day with her Dexcom CGM:  Previously:   Previously:   Lowest sugar was 38 >>... 50s >> 45 (CGM but 62 by glucometer); + hypoglycemia awareness in the 70s.  No recent hyper or hypoglycemia admissions.  She has a glucagon kit at home. Highest sugar was 500s (bagel)  ... >> 300s >> 345.   Pt's meals are: - Breakfast: protein bar + fruit >> granola bar (25 g carbs) - 10 am snack  - Lunch: apple + cheese, carrots - Snack bar: 15g carbs - Glucerna - Dinner: meat + 1/2 backed potato + veggies/salad - Snacks: popcorn, salty snacks: Potato chips  -No CKD, last BUN/creatinine:  Lab Results  Component Value Date   BUN 14 10/05/2021   CREATININE 0.99 10/05/2021  Previously  on quinapril, now off.  + HL; last set of lipids: Lab Results  Component Value Date   CHOL 132 10/05/2021   HDL 46.00 10/05/2021   LDLCALC 49 10/05/2021   LDLDIRECT 120.0 06/17/2019   TRIG 186.0 (H) 10/05/2021   CHOLHDL 3 10/05/2021  She tried pravastatin, lovastatin, Zetia, even adding co-Q10 but she still had muscle cramps >>  now on Repatha, tolerated well. She also uses uses 2 creams: Theraworx + Muscle calm.    - last eye exam was on 09/17/2021: + DR  - no numbness and tingling in her feet. Last foot exam: 11/2021.  On ASA 81.  Latest TSH is normal: Lab Results  Component Value Date   TSH 2.85 06/11/2021   She also has a history of HTN.  ROS: + See HPI  I reviewed pt's medications, allergies, PMH, social hx, family hx, and changes were documented in the history of present illness. Otherwise,  unchanged from my initial visit note.  Past Medical History:  Diagnosis Date   Allergy    Anemia    past history long ago   Borderline HTN (hypertension)    CAD (coronary artery disease)    a. 02/2020 Cath: LM nl, LAD large, 90p, 28m D1 90p, LCX nl, OM 60-70p, RCA dominant w/ dampening upon engagement of ostium. 70p/m, RPDA 50. EF 55-65%.   COVID-19 virus infection 02/2021   Diabetes ([facility]    Disorder of kidney    has abd kidney (right)   GERD (gastroesophageal reflux disease)    History of diabetic gastroparesis    History of echocardiogram    a. 01/2013 Echo: EF 55-65%, no rwma. Mild MR. Nl RV fxn.   HLD (hyperlipidemia)    Hx: UTI (urinary tract infection)    IBS (irritable bowel syndrome)    Nephrolithiasis 2005   Osteopenia 2012   per pt   Reflux esophagitis    Systolic murmur 009/3267  mild mitral insuff   Type 1 diabetes mellitus with diabetic retinopathy without macular edema (HCC)    dx at 61y/o, background retinopathy   Past Surgical History:  Procedure Laterality Date   APPENDECTOMY     CESAREAN SECTION  1985   COLONOSCOPY  07/2016   WNL (Pyrtle)   CORONARY ARTERY BYPASS GRAFT N/A 02/21/2020   Procedure: CORONARY ARTERY BYPASS GRAFTING (CABG) times five using left internal mammary and right leg saphenous vein;  Surgeon: LLajuana Matte MD;  [facility]  Service: Open Heart Surgery;  Laterality: N/A;   ESOPHAGOGASTRODUODENOSCOPY  07/2016   LA Grade C reflux esophagitis, concern for stasis/dysmotility (Pyrtle)   LAPAROSCOPIC APPENDECTOMY N/A 09/01/2015   Procedure: APPENDECTOMY LAPAROSCOPIC;  Surgeon: JJudeth Horn MD;  [facility]  Service: General;  Laterality: N/A;   LEFT HEART CATH AND CORONARY ANGIOGRAPHY N/A 02/17/2020   Procedure: LEFT HEART CATH AND CORONARY ANGIOGRAPHY;  Surgeon: GMinna Merritts MD;  [facility];  Service: Cardiovascular;  Laterality: N/A;   LEFT HEART CATH AND CORS/GRAFTS ANGIOGRAPHY N/A 02/29/2020    early failure of 4/5 bypass grafts - medically managed (Angelena Form CAnnita Brod MD)   ORIF FEMUR FRACTURE Left 1996   Corrected by patient in history 2016   PARTIAL HYSTERECTOMY  2000   paps by WErling ConteOB/GYN Tay[place]   TEE WITHOUT CARDIOVERSION N/A 02/21/2020   Procedure: TRANSESOPHAGEAL ECHOCARDIOGRAM (TEE);  Surgeon: LLajuana Matte MD;  [place] [facility]  Service: Open Heart Surgery;  Laterality: N/A;   TONSILLECTOMY AND  ADENOIDECTOMY  1975   TRIGGER FINGER RELEASE Left 12/2010   thumb   Social History   Socioeconomic History   Marital status: Married    Spouse name: Not on file   Number of children: 1   Years of education: 16   Highest education level: Bachelor's degree (e.g., BA, AB, BS)  Occupational History   Occupation: HR Marketing executive: OTHER    Comment: Sport and exercise psychologist  Tobacco Use   Smoking status: Never   Smokeless tobacco: Never  Vaping Use   Vaping Use: Never used  Substance and Sexual Activity   Alcohol use: No    Alcohol/week: 0.0 standard drinks of alcohol   Drug use: No   Sexual activity: Yes    Birth control/protection: None    Comment: Married  Other Topics Concern   Not on file  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  Has one daughter.  Education: college.      1 dog   Social Determinants of Radio broadcast assistant Strain: Not on file  Food Insecurity: Not on file  Transportation Needs: Not on file  Physical Activity: Not on file  Stress: Not on file  Social Connections: Not on file  Intimate Partner Violence: Not on file   Current Outpatient Medications on File Prior to Visit  Medication Sig Dispense Refill   aspirin 81 MG EC tablet Take 1 tablet (81 mg total) by mouth daily.     Continuous Blood Gluc Sensor (DEXCOM G7 SENSOR) MISC 3 each by Does not apply route every 30 (thirty) days. Apply 1 sensor every 10 days 9 each 4   esomeprazole  (NEXIUM) 40 MG capsule Take 1 capsule (40 mg total) by mouth daily. Patient needs office visit for further refills 90 capsule 0   glucagon (GLUCAGON EMERGENCY) 1 MG injection Inject 1 mg into the muscle once as needed (in case of severe hypoglycemia). 1 each 12   insulin NPH Human (NOVOLIN N) 100 UNIT/ML injection Inject 0.04-0.06 mLs (4-6 Units total) into the skin daily before breakfast. 9 mL 1   insulin regular (NOVOLIN R) 100 units/mL injection INJECT 6 UNITS TOTAL INTO THE SKIN 3 (THREE) TIMES DAILY BEFORE MEALS. 30 mL 3   metoprolol succinate (TOPROL XL) 25 MG 24 hr tablet Take 1 tablet (25 mg total) by mouth daily. 90 tablet 3   montelukast (SINGULAIR) 10 MG tablet TAKE 1 TABLET BY MOUTH EVERYDAY AT BEDTIME 90 tablet 0   Multiple Vitamin (MULTIVITAMIN) tablet Take 1 tablet by mouth daily.     nitroGLYCERIN (NITROSTAT) 0.4 MG SL tablet Place 1 tablet (0.4 mg total) under the tongue every 5 (five) minutes as needed for chest pain. 25 tablet 3   REPATHA SURECLICK 244 MG/ML SOAJ INJECT 140 MG INTO THE SKIN EVERY 14 (FOURTEEN) DAYS. 6 mL 3   TECHLITE INSULIN SYRINGE 31G X 15/64" 0.3 ML MISC USE UP TO 5 TIMES DAILY TO INJECT INSULIN DAILY 300 each 1   No current facility-administered medications on file prior to visit.   Allergies  Allergen Reactions   Protonix [Pantoprazole] Swelling    Facial Swelling   Toujeo Solostar [Insulin Glargine] Shortness Of Breath   Lactose Intolerance (Gi)     GI   Magnesium-Containing Compounds Swelling and Other (See Comments)    limbs   Adhesive [Tape] Rash and Other (See Comments)  Band aids   Family History  Problem Relation Age of Onset   Diabetes Maternal Grandmother    Heart failure Maternal Grandmother    COPD Maternal Grandmother        smoker   Thyroid disease Maternal Grandmother    Vascular Disease Maternal Grandmother    Colon cancer Maternal Grandfather    Colon polyps Mother    Breast cancer Other    CAD Brother 69       stent    Esophageal cancer Neg Hx    Rectal cancer Neg Hx    Stomach cancer Neg Hx    PE: BP 130/70 (BP Location: Right Arm, Patient Position: Sitting, Cuff Size: Normal)   Pulse 63   Ht 5' 4"  (1.626 m)   Wt 133 lb (60.3 kg)   SpO2 99%   BMI 22.83 kg/m    Wt Readings from Last 3 Encounters:  06/28/22 133 lb (60.3 kg)  04/17/22 133 lb (60.3 kg)  03/18/22 130 lb 6.4 oz (59.1 kg)   Constitutional: normal weight, in NAD Eyes: no exophthalmos ENT: moist mucous membranes, no masses palpated in neck, no cervical lymphadenopathy Cardiovascular: RRR, No MRG Respiratory: CTA B Musculoskeletal: no deformities Skin: moist, warm, no rashes Neurological: no tremor with outstretched hands  ASSESSMENT: 1. DM1, uncontrolled, with complications - CAD - s/p 5v CABG - 02/21/2020 - Mild DR - Gastroparesis - This is the reason why she is on regular insulin, and she is taking it 10 minutes  rather than 30 minutes before eating  She had low CBGs with Novolog!  2. HL  PLAN:  1.  Patient with difficult to control type 1 diabetes, on basal/bolus insulin regimen, after she stopped her insulin pump before last visit since she felt that her sugars were higher on the OmniPod 5 pump.  She previously had an insulin pump in the past but did not feel that the sugars improved and she was very reticent to retry this.  However, she finally accepted the OmniPod 5 pump, but again, she was not happy with it and stopped in 02/2022. -At last visit, HbA1c was higher, at 7.3%.  Sugars continued to be very high in the second half of the night and much better controlled later in the day.  We relaxed her insulin to carb ratio with lunch and dinner and, since she was not on long-acting insulin (she stopped this previously), we added NPH insulin before the increase in blood sugars overnight. -We did not start a GLP-1 receptor agonist due to history of gastroparesis.  I did recommend Farxiga in the past but she is not on this. CGM  interpretation: -At today's visit, we reviewed her CGM downloads: It appears that 64% of values are in target range (goal >70%), while 35% are higher than 180 (goal <25%), and 1% are lower than 70 (goal <4%).  The calculated average blood sugar is 163.  The projected HbA1c for the next 3 months (GMI) is 7.2%. -Reviewing the CGM trends, it appears that her sugars are lower later in the day, but they increase approximately 2 AM and remain elevated until approximately 2 PM.  Upon questioning, she is taking now 4 units of NPH between 1 and 2 AM.  She sets an alarm to remember to take it. She feels that sugars improved slightly after starting to do so.  However, she is worried that she is repeatedly trying to correct the high blood sugars in the first half of the day ending  up with stacking the insulin and having low blood sugars before and after dinner.  After this, she is trying to correct hypoglycemia and she feels that this may cause the high blood sugars during the night.  At this visit, we discussed about the proper protocol to raise her blood sugars we also discussed about not over correcting hyperglycemia due to the risk of stacking especially with the longer acting regular insulin.  I advised her to try to avoid repeated correction boluses, and to space them up possibly approximately every 4 hours.  She needs to watch the arrows on the CGM to see when it is appropriate to correct. -For now, my recommendation is to try her best to avoid the high blood sugars during the night.  We discussed about different strategies to do so: Increase her NPH at night to 6 units or even more Possibly add regular insulin to her NPH at night, but this does have the risk of lower blood sugars during the night so would not be preferred Decrease the snacks after dinner, which she is mostly using to prevent her blood sugars from dropping overnight, if her sugars are lower Exercise around dinnertime rather than in the morning   -She will try the above strategies.  I advised her to write these down so that she knows what she already tried. -We also discussed about the 15 to 30 minutes lag time between capillary blood sugars and interstitial blood sugars -I advised her to expect difference between these especially when she is correcting a high or low blood sugars or when she is eating, especially high glycemic index foods. -I advised her to Patient Instructions  Please increase: - NPH insulin 4 >> 6 units during the night (may need even more) - R insulin: B'fast: 5:1 (if going for a walk: 10:1) Lunch: 15:1 Dinner: 25:1 - target 150 - insulin sensitivity factor (ISF) 60: 150-200: + 1 unit 201-260: + 2 units 261-320: + 3 units 321-380: + 4 units >380: + 5 units Do not correct bedtime sugars <300, and only inject 1 unit then.  For the snack at night, do not take more than 1-2 units of regular insulin!  Please return in 3-4 months.  - we checked her HbA1c: 7.1% (lower) - advised to check sugars at different times of the day - 4x a day, rotating check times - advised for yearly eye exams >> she is UTD - return to clinic in 3-4 months     2. HL -Reviewed latest lipid panel from 10/2021: Excellent LDL, triglycerides high: Lab Results  Component Value Date   CHOL 132 10/05/2021   HDL 46.00 10/05/2021   LDLCALC 49 10/05/2021   LDLDIRECT 120.0 06/17/2019   TRIG 186.0 (H) 10/05/2021   CHOLHDL 3 10/05/2021  -She is on Repatha and fish oil without side effects  - Total time spent for the visit: 40 min, in precharting, obtaining medical information from the patient and from the chart, reviewing her  previous labs, imaging evaluations, and treatments, reviewing her symptoms, counseling her about her conditions (please see the discussed topics above), and developing a plan to further treat them and avoid further hypo and hyperglycemia; she had a number of questions which I addressed.  Philemon Kingdom, MD  PhD [facility]ocr[facility]

## 2022-06-28 NOTE — Patient Instructions (Addendum)
Please increase: - NPH insulin 4 >> 6 units during the night (may need even more) - R insulin: B'fast: 5:1 (if going for a walk: 10:1) Lunch: 15:1 Dinner: 25:1 - target 150 - insulin sensitivity factor (ISF) 60: 150-200: + 1 unit 201-260: + 2 units 261-320: + 3 units 321-380: + 4 units >380: + 5 units Do not correct bedtime sugars <300, and only inject 1 unit then.  For the snack at night, do not take more than 1-2 units of regular insulin!  Please return in 3-4 months.

## 2022-07-02 ENCOUNTER — Encounter: Payer: Self-pay | Admitting: Cardiovascular Disease

## 2022-07-02 ENCOUNTER — Ambulatory Visit: Payer: BC Managed Care – PPO | Attending: Cardiovascular Disease | Admitting: Cardiovascular Disease

## 2022-07-02 VITALS — BP 136/60 | HR 56 | Ht 64.0 in | Wt 134.4 lb

## 2022-07-02 DIAGNOSIS — I25708 Atherosclerosis of coronary artery bypass graft(s), unspecified, with other forms of angina pectoris: Secondary | ICD-10-CM

## 2022-07-02 DIAGNOSIS — E782 Mixed hyperlipidemia: Secondary | ICD-10-CM | POA: Diagnosis not present

## 2022-07-02 DIAGNOSIS — E108 Type 1 diabetes mellitus with unspecified complications: Secondary | ICD-10-CM | POA: Diagnosis not present

## 2022-07-02 NOTE — Patient Instructions (Signed)
Medication Instructions:  The current medical regimen is effective;  continue present plan and medications.  *If you need a refill on your cardiac medications before your next appointment, please call your pharmacy*   Follow-Up: At [facility], you and your health needs are our priority.  As part of our continuing mission to provide you with exceptional heart care, we have created designated Provider Care Teams.  These Care Teams include your primary Cardiologist (physician) and Advanced Practice Providers (APPs -  Physician Assistants and Nurse Practitioners) who all work together to provide you with the care you need, when you need it.  We recommend signing up for the patient portal called "MyChart".  Sign up information is provided on this After Visit Summary.  MyChart is used to connect with patients for Virtual Visits (Telemedicine).  Patients are able to view lab/test results, encounter notes, upcoming appointments, etc.  Non-urgent messages can be sent to your provider as well.   To learn more about what you can do with MyChart, go to [email or web address].    Your next appointment:   6 month(s)  The format for your next appointment:   In Person  Provider:   Mihai Croitoru, MD          

## 2022-07-02 NOTE — Progress Notes (Signed)
Cardiology Office Note:    Date:  07/05/2022   ID:  Felicia [name], DOB [birth year], MRN [id number]  PCP:  [name], MD  [facility] Cardiologist:  Thurmon Fair, MD  [facility] Electrophysiologist:  None   Referring MD: [name], MD   Chief Complaint  Patient presents with   Coronary Artery Disease    History of Present Illness:    Felicia [name] is a 61 y.o. female with a hx of CAD s/p CABG April 2021, with early vein graft occlusion but patent LIMA to LAD, type 1 diabetes mellitus on insulin, hypercholesterolemia intolerant to statins on PCSK9 inhibitor.  She is doing well in particular feels that her chest discomfort has improved after increasing the dose of metoprolol.  She has been able to tolerate this without symptomatic hypotension, although on 1 occasion she did record a blood pressure of 90/59.  Her typical blood pressure is in the 100-120/60-65 range.  She has not experienced syncope or near syncope.  She complains of fatigue when she climbs a flight of stairs.  Each flight has about 16 steps and she has to stop at the top of the flight to rest for a second.  On the other hand she is able to walk on the treadmill 7 days a week.  She exercises between 20 and 45 minutes at 3 miles an hour with a incline of 2-3.  This does not cause dyspnea or angina.  She has not had orthopnea, PND or lower extremity edema and denies intermittent claudication.  She denies palpitations.  She continues to work full-time.  Metabolic control is excellent with an LDL of 49 and HDL of 46 (on Repatha), minimal hypertriglyceridemia, hemoglobin A1c 7.1%.  She has not had symptomatic hypoglycemia.  Early postoperative course was complicated by an episode of syncope with fall and R orbital fracture and left wrist fracture and coronary/graft angiography showed that she had lost patency of all the SVGs.  The LIMA to LAD bypass was patent.  No intervention was performed.  Echocardiogram  showed that left ventricular wall motion was abnormal, significant for moderate hypokinesis of the apical segments of the anterior and anterolateral walls, but overall LVEF was normal at 55 to 60%.  Diagnostic Dominance: Right      Repeat echocardiogram in August showed a residual apical lateral wall motion abnormality, but normal overall EF.  Her nuclear stress that showed similar findings with an apical scar, no evidence of reversible ischemia and normal left ventricular systolic function.    Past Medical History:  Diagnosis Date   Allergy    Anemia    past history long ago   Borderline HTN (hypertension)    CAD (coronary artery disease)    a. 02/2020 Cath: LM nl, LAD large, 90p, 85m, D1 90p, LCX nl, OM 60-70p, RCA dominant w/ dampening upon engagement of ostium. 70p/m, RPDA 50. EF 55-65%.   COVID-19 virus infection 02/2021   Diabetes (HCC)    Disorder of kidney    has abd kidney (right)   GERD (gastroesophageal reflux disease)    History of diabetic gastroparesis    History of echocardiogram    a. 01/2013 Echo: EF 55-65%, no rwma. Mild MR. Nl RV fxn.   HLD (hyperlipidemia)    Hx: UTI (urinary tract infection)    IBS (irritable bowel syndrome)    Nephrolithiasis 2005   Osteopenia 2012   per pt   Reflux esophagitis    Systolic murmur 01/2013   mild  mitral insuff   Type 1 diabetes mellitus with diabetic retinopathy without macular edema (HCC)    dx at 61 y/o, background retinopathy    Past Surgical History:  Procedure Laterality Date   APPENDECTOMY     CESAREAN SECTION  1985   COLONOSCOPY  07/2016   WNL (Pyrtle)   CORONARY ARTERY BYPASS GRAFT N/A 02/21/2020   Procedure: CORONARY ARTERY BYPASS GRAFTING (CABG) times five using left internal mammary and right leg saphenous vein;  Surgeon: Corliss Skains, MD;  Location: [facility] OR;  Service: Open Heart Surgery;  Laterality: N/A;   ESOPHAGOGASTRODUODENOSCOPY  07/2016   LA Grade C reflux esophagitis, concern for  stasis/dysmotility (Pyrtle)   LAPAROSCOPIC APPENDECTOMY N/A 09/01/2015   Procedure: APPENDECTOMY LAPAROSCOPIC;  Surgeon: Jimmye Norman, MD;  Location: [facility] OR;  Service: General;  Laterality: N/A;   LEFT HEART CATH AND CORONARY ANGIOGRAPHY N/A 02/17/2020   Procedure: LEFT HEART CATH AND CORONARY ANGIOGRAPHY;  Surgeon: Antonieta Iba, MD;  Location: [facility] INVASIVE CV LAB;  Service: Cardiovascular;  Laterality: N/A;   LEFT HEART CATH AND CORS/GRAFTS ANGIOGRAPHY N/A 02/29/2020   early failure of 4/5 bypass grafts - medically managed Clifton James, Nile Dear, MD)   ORIF FEMUR FRACTURE Left 1996   Corrected by patient in history 2016   PARTIAL HYSTERECTOMY  2000   paps by [facility]   TEE WITHOUT CARDIOVERSION N/A 02/21/2020   Procedure: TRANSESOPHAGEAL ECHOCARDIOGRAM (TEE);  Surgeon: Corliss Skains, MD;  Location: [facility] OR;  Service: Open Heart Surgery;  Laterality: N/A;   TONSILLECTOMY AND ADENOIDECTOMY  1975   TRIGGER FINGER RELEASE Left 12/2010   thumb    Current Medications: Current Meds  Medication Sig   aspirin 81 MG EC tablet Take 1 tablet (81 mg total) by mouth daily.   Continuous Blood Gluc Sensor (DEXCOM G7 SENSOR) MISC 3 each by Does not apply route every 30 (thirty) days. Apply 1 sensor every 10 days   esomeprazole (NEXIUM) 40 MG capsule Take 1 capsule (40 mg total) by mouth daily. Patient needs office visit for further refills   insulin NPH Human (NOVOLIN N) 100 UNIT/ML injection Inject 0.04-0.06 mLs (4-6 Units total) into the skin daily before breakfast.   insulin regular (NOVOLIN R) 100 units/mL injection INJECT 6 UNITS TOTAL INTO THE SKIN 3 (THREE) TIMES DAILY BEFORE MEALS.   metoprolol succinate (TOPROL XL) 25 MG 24 hr tablet Take 1 tablet (25 mg total) by mouth daily.   Multiple Vitamin (MULTIVITAMIN) tablet Take 1 tablet by mouth daily.   REPATHA SURECLICK 140 MG/ML SOAJ INJECT 140 MG INTO THE SKIN EVERY 14 (FOURTEEN) DAYS.   TECHLITE INSULIN SYRINGE 31G X  15/64" 0.3 ML MISC USE UP TO 5 TIMES DAILY TO INJECT INSULIN DAILY     Allergies:   Protonix [pantoprazole], Toujeo solostar [insulin glargine], Lactose intolerance (gi), Magnesium-containing compounds, and Adhesive [tape]   Social History   Socioeconomic History   Marital status: Married    Spouse name: Not on file   Number of children: 1   Years of education: 16   Highest education level: Bachelor's degree (e.g., BA, AB, BS)  Occupational History   Occupation: HR Chemical engineer: OTHER    Comment: Scientist, research (medical)  Tobacco Use   Smoking status: Never   Smokeless tobacco: Never  Vaping Use   Vaping Use: Never used  Substance and Sexual Activity   Alcohol use: No    Alcohol/week: 0.0 standard drinks of alcohol   Drug use:  No   Sexual activity: Yes    Birth control/protection: None    Comment: Married  Other Topics Concern   Not on file  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 For [employer] (ALF, SNF)      Lives with husband in a 2 story home.  Has one daughter.  Education: college.      1 dog   Social Determinants of Radio broadcast assistant Strain: Not on file  Food Insecurity: Not on file  Transportation Needs: Not on file  Physical Activity: Not on file  Stress: Not on file  Social Connections: Not on file     Family History: The patient's family history includes Breast cancer in an other family member; CAD (age of onset: 57) in her brother; COPD in her maternal grandmother; Colon cancer in her maternal grandfather; Colon polyps in her mother; Diabetes in her maternal grandmother; Heart failure in her maternal grandmother; Thyroid disease in her maternal grandmother; Vascular Disease in her maternal grandmother. There is no history of Esophageal cancer, Rectal cancer, or Stomach cancer.  ROS:   Please see the history of present illness.     All other systems reviewed and are negative.  EKGs/Labs/Other Studies Reviewed:    The following  studies were reviewed today: Cardiac catheterizations performed on 4/15 (before bypass) and 4/27 (after bypass surgery) Echocardiograms performed on 4/15 and 4/25  Nuclear stress test 08/03/2020    The left ventricular ejection fraction is hyperdynamic (>65%). Nuclear stress EF: 70%. There was no ST segment deviation noted during stress. Defect 1: There is a small defect of severe severity present in the apical anterior and apex location.   There is a small irreversible defect of severe severity present in the apical anterior and apex location consistent with prior infarct in the distal LAD territory, there is no peri-infarct ischemia, overall LVEF is normal.     Echo 06/12/2020  1. Left ventricular ejection fraction, by estimation, is 55 to 60%. The  left ventricle has normal function. The left ventricle has no regional  wall motion abnormalities. Left ventricular diastolic parameters are  consistent with Grade II diastolic  dysfunction (pseudonormalization).   2. Right ventricular systolic function is normal. The right ventricular  size is normal.   3. The mitral valve is normal in structure. Mild mitral valve  regurgitation. No evidence of mitral stenosis.   4. The aortic valve is tricuspid. Aortic valve regurgitation is not  visualized. Mild aortic valve sclerosis is present, with no evidence of  aortic valve stenosis.   5. The inferior vena cava is normal in size with greater than 50%  respiratory variability, suggesting right atrial pressure of 3 mmHg.   EKG:  EKG is ordered today.  Personally reviewed, it is very similar to previous tracings.  Shows normal sinus rhythm with QS in leads V1-V2 but without any repolarization abnormalities.   Recent Labs: 10/05/2021: BUN 14; Creatinine, Ser 0.99; Hemoglobin 12.4; Platelets 268.0; Potassium 4.5; Sodium 139  Recent Lipid Panel    Component Value Date/Time   CHOL 132 10/05/2021 0755   TRIG 186.0 (H) 10/05/2021 0755   HDL 46.00  10/05/2021 0755   CHOLHDL 3 10/05/2021 0755   VLDL 37.2 10/05/2021 0755   LDLCALC 49 10/05/2021 0755   LDLDIRECT 120.0 06/17/2019 0738    Physical Exam:    VS:  BP 136/60 (BP Location: Left Arm, Patient Position: Sitting, Cuff Size: Normal)   Pulse (!) 56   Ht 5'  4" (1.626 m)   Wt 134 lb 7.2 oz (61 kg)   SpO2 98%   BMI 23.08 kg/m     Wt Readings from Last 3 Encounters:  07/02/22 134 lb 7.2 oz (61 kg)  06/28/22 133 lb (60.3 kg)  04/17/22 133 lb (60.3 kg)       General: Alert, oriented x3, no distress, very lean Head: no evidence of trauma, PERRL, EOMI, no exophtalmos or lid lag, no myxedema, no xanthelasma; normal ears, nose and oropharynx Neck: normal jugular venous pulsations and no hepatojugular reflux; brisk carotid pulses without delay and no carotid bruits Chest: clear to auscultation, no signs of consolidation by percussion or palpation, normal fremitus, symmetrical and full respiratory excursions Cardiovascular: normal position and quality of the apical impulse, regular rhythm, normal first and second heart sounds, no murmurs, rubs or gallops Abdomen: no tenderness or distention, no masses by palpation, no abnormal pulsatility or arterial bruits, normal bowel sounds, no hepatosplenomegaly Extremities: no clubbing, cyanosis or edema; 2+ radial, ulnar and brachial pulses bilaterally; 2+ right femoral, posterior tibial and dorsalis pedis pulses; 2+ left femoral, posterior tibial and dorsalis pedis pulses; no subclavian or femoral bruits Neurological: grossly nonfocal Psych: Normal mood and affect   ASSESSMENT:    1. Coronary artery disease of bypass graft of native heart with stable angina pectoris (Anna)   2. Mixed hyperlipidemia   3. Controlled type 1 diabetes mellitus with complication, with long-term current use of insulin (HCC)      PLAN:    In order of problems listed above:  CAD s/p CABG: Angina is now well controlled on the higher dose of beta-blocker.   Overall doing well with medical therapy.  Once again we reviewed the difference being stable/exertional angina and unstable angina/acute coronary syndromes.  She has extensive coronary disease and had early graft failure of all 3 vein grafts.  There are many territories likely responsible for ischemic symptoms.  She has preserved left ventricular systolic function.  She is on low-dose aspirin and appropriate lipid-lowering therapy with the Repatha (intolerant to statins). She did not do well with long-acting nitrates because of headache.  If beta-blockers alone are insufficient to control stable angina, would strongly consider Ranexa as the next step. HLP: LDL in target range on current therapy.  Statin intolerant but with excellent improvement in lipid profile on PCSK9 inhibitor.  The mildly elevated triglycerides do not require separate therapy, focus on good glycemic control. DM: Good glycemic control.  Type 1 diabetes mellitus for 45 years. She is very insulin sensitive.  Followed by Dr. Cruzita Lederer in the endocrinology clinic.   Medication Adjustments/Labs and Tests Ordered: Current medicines are reviewed at length with the patient today.  Concerns regarding medicines are outlined above.  No orders of the defined types were placed in this encounter.  No orders of the defined types were placed in this encounter.   Patient Instructions  Medication Instructions:  The current medical regimen is effective;  continue present plan and medications.  *If you need a refill on your cardiac medications before your next appointment, please call your pharmacy*   Follow-Up: At [facility], you and your health needs are our priority.  As part of our continuing mission to provide you with exceptional heart care, we have created designated Provider Care Teams.  These Care Teams include your primary Cardiologist (physician) and Advanced Practice Providers (APPs -  Physician Assistants and Nurse  Practitioners) who all work together to provide you with the care  you need, when you need it.  We recommend signing up for the patient portal called "MyChart".  Sign up information is provided on this After Visit Summary.  MyChart is used to connect with patients for Virtual Visits (Telemedicine).  Patients are able to view lab/test results, encounter notes, upcoming appointments, etc.  Non-urgent messages can be sent to your provider as well.   To learn more about what you can do with MyChart, go to [email or web address].    Your next appointment:   6 month(s)  The format for your next appointment:   In Person  Provider:   Sanda Klein, MD           Signed, Sanda Klein, MD  07/05/2022 10:00 AM    [place]

## 2022-07-05 ENCOUNTER — Encounter: Payer: Self-pay | Admitting: Cardiovascular Disease

## 2022-07-09 ENCOUNTER — Encounter: Payer: Self-pay | Admitting: Cardiovascular Disease

## 2022-07-11 DIAGNOSIS — M65341 Trigger finger, right ring finger: Secondary | ICD-10-CM | POA: Diagnosis not present

## 2022-07-15 NOTE — Telephone Encounter (Signed)
error 

## 2022-07-22 ENCOUNTER — Encounter: Payer: Self-pay | Admitting: Family Medicine

## 2022-07-22 ENCOUNTER — Encounter (INDEPENDENT_AMBULATORY_CARE_PROVIDER_SITE_OTHER): Payer: BC Managed Care – PPO | Admitting: Internal Medicine

## 2022-07-22 DIAGNOSIS — E103293 Type 1 diabetes mellitus with mild nonproliferative diabetic retinopathy without macular edema, bilateral: Secondary | ICD-10-CM

## 2022-07-22 DIAGNOSIS — E1059 Type 1 diabetes mellitus with other circulatory complications: Secondary | ICD-10-CM

## 2022-07-25 NOTE — Telephone Encounter (Addendum)
 -  I reviewed her CGM downloads: It appears that 73% of values are in target range (goal >70%), while 7% are higher than 180 (goal <25%), and 0% are lower than 70 (goal <4%).  The calculated average blood sugar is 156.  The projected HbA1c for the next 3 months (GMI) is 7.0%. -Reviewing the CGM trends, sugars appear to be increasing in the second half of the night peaking around 9 AM.  She has a second peak after lunch, but the sugars are fluctuating after this meal.  Sugars are usually better controlled after dinner.  Please see the MyChart message reply(ies) for my assessment and plan.    This patient gave consent for this Medical Advice Message and is aware that it may result in a bill to [employer], as well as the possibility of receiving a bill for a co-payment or deductible. They are an established patient, but are not seeking medical advice exclusively about a problem treated during an in person or video visit in the last seven days. I did not recommend an in person or video visit within seven days of my reply.    I spent a total of 15 minutes cumulative time within 7 days through [employer].  Philemon Kingdom, MD

## 2022-07-26 ENCOUNTER — Other Ambulatory Visit: Payer: Self-pay | Admitting: Internal Medicine

## 2022-07-26 MED ORDER — NOVOLOG FLEXPEN 100 UNIT/ML ~~LOC~~ SOPN: PEN_INJECTOR | SUBCUTANEOUS | 3 refills | Status: DC

## 2022-07-31 NOTE — Addendum Note (Signed)
Addended by: Ria Bush on: 07/31/2022 01:40 PM   Modules accepted: Orders

## 2022-08-01 ENCOUNTER — Other Ambulatory Visit: Payer: Self-pay | Admitting: Obstetrics and Gynecology

## 2022-08-01 DIAGNOSIS — M8580 Other specified disorders of bone density and structure, unspecified site: Secondary | ICD-10-CM

## 2022-08-02 ENCOUNTER — Encounter: Payer: Self-pay | Admitting: Internal Medicine

## 2022-08-10 ENCOUNTER — Other Ambulatory Visit: Payer: Self-pay | Admitting: Family Medicine

## 2022-08-10 DIAGNOSIS — E1059 Type 1 diabetes mellitus with other circulatory complications: Secondary | ICD-10-CM

## 2022-08-10 DIAGNOSIS — N289 Disorder of kidney and ureter, unspecified: Secondary | ICD-10-CM

## 2022-08-10 DIAGNOSIS — E785 Hyperlipidemia, unspecified: Secondary | ICD-10-CM

## 2022-08-13 ENCOUNTER — Telehealth: Payer: Self-pay | Admitting: Family Medicine

## 2022-08-13 ENCOUNTER — Other Ambulatory Visit (INDEPENDENT_AMBULATORY_CARE_PROVIDER_SITE_OTHER): Payer: BC Managed Care – PPO

## 2022-08-13 DIAGNOSIS — E1069 Type 1 diabetes mellitus with other specified complication: Secondary | ICD-10-CM

## 2022-08-13 DIAGNOSIS — E785 Hyperlipidemia, unspecified: Secondary | ICD-10-CM

## 2022-08-13 DIAGNOSIS — E1059 Type 1 diabetes mellitus with other circulatory complications: Secondary | ICD-10-CM | POA: Diagnosis not present

## 2022-08-13 LAB — LIPID PANEL
Cholesterol: 138 mg/dL (ref 0–200)
HDL: 49.4 mg/dL (ref >39.00)
LDL Cholesterol: 66 mg/dL (ref 0–99)
NonHDL: 88.91
Total CHOL/HDL Ratio: 3
Triglycerides: 116 mg/dL (ref 0.0–149.0)
VLDL: 23.2 mg/dL (ref 0.0–40.0)

## 2022-08-13 LAB — COMPREHENSIVE METABOLIC PANEL
ALT: 15 U/L (ref 0–35)
AST: 20 U/L (ref 0–37)
Albumin: 3.9 g/dL (ref 3.5–5.2)
Alkaline Phosphatase: 78 U/L (ref 39–117)
BUN: 13 mg/dL (ref 6–23)
CO2: 28 mEq/L (ref 19–32)
Calcium: 8.9 mg/dL (ref 8.4–10.5)
Chloride: 103 mEq/L (ref 96–112)
Creatinine, Ser: 1.16 mg/dL (ref 0.40–1.20)
GFR: 51.61 mL/min — ABNORMAL LOW (ref >60.00)
Glucose, Bld: 217 mg/dL — ABNORMAL HIGH (ref 70–99)
Potassium: 4.7 mEq/L (ref 3.5–5.1)
Sodium: 138 mEq/L (ref 135–145)
Total Bilirubin: 0.4 mg/dL (ref 0.2–1.2)
Total Protein: 6.8 g/dL (ref 6.0–8.3)

## 2022-08-13 LAB — MICROALBUMIN / CREATININE URINE RATIO
Creatinine,U: 85.3 mg/dL
Microalb Creat Ratio: 2 mg/g (ref 0.0–30.0)
Microalb, Ur: 1.7 mg/dL (ref 0.0–1.9)

## 2022-08-13 NOTE — Telephone Encounter (Signed)
Amber from [facility] called in and stated that Dr. Buddy Duty isn't accepting new patients for diabetes. She tried to get patient scheduled with Dr. Meredith Pel but patient declined to schedule.

## 2022-08-13 NOTE — Telephone Encounter (Signed)
Spoke to pt she states that she is going to talk to Dr. Darnell Level next week at her appt to see what he thinks she should do///ELEA   I have closed the referral

## 2022-08-18 LAB — FRUCTOSAMINE: Fructosamine: 324 umol/L — ABNORMAL HIGH (ref 205–285)

## 2022-08-20 ENCOUNTER — Encounter: Payer: Self-pay | Admitting: Family Medicine

## 2022-08-20 ENCOUNTER — Ambulatory Visit (INDEPENDENT_AMBULATORY_CARE_PROVIDER_SITE_OTHER): Payer: BC Managed Care – PPO | Admitting: Family Medicine

## 2022-08-20 VITALS — BP 134/68 | HR 67 | Temp 97.4°F | Ht 62.5 in | Wt 133.5 lb

## 2022-08-20 DIAGNOSIS — I10 Essential (primary) hypertension: Secondary | ICD-10-CM | POA: Diagnosis not present

## 2022-08-20 DIAGNOSIS — E785 Hyperlipidemia, unspecified: Secondary | ICD-10-CM

## 2022-08-20 DIAGNOSIS — Z0000 Encounter for general adult medical examination without abnormal findings: Secondary | ICD-10-CM | POA: Diagnosis not present

## 2022-08-20 DIAGNOSIS — Z951 Presence of aortocoronary bypass graft: Secondary | ICD-10-CM

## 2022-08-20 DIAGNOSIS — N289 Disorder of kidney and ureter, unspecified: Secondary | ICD-10-CM

## 2022-08-20 DIAGNOSIS — K2100 Gastro-esophageal reflux disease with esophagitis, without bleeding: Secondary | ICD-10-CM

## 2022-08-20 DIAGNOSIS — E1069 Type 1 diabetes mellitus with other specified complication: Secondary | ICD-10-CM | POA: Diagnosis not present

## 2022-08-20 DIAGNOSIS — I25708 Atherosclerosis of coronary artery bypass graft(s), unspecified, with other forms of angina pectoris: Secondary | ICD-10-CM

## 2022-08-20 DIAGNOSIS — E1059 Type 1 diabetes mellitus with other circulatory complications: Secondary | ICD-10-CM | POA: Diagnosis not present

## 2022-08-20 DIAGNOSIS — N3941 Urge incontinence: Secondary | ICD-10-CM

## 2022-08-20 NOTE — Assessment & Plan Note (Signed)
Followed by endo, considering 2nd opinion in [place].

## 2022-08-20 NOTE — Progress Notes (Signed)
Patient ID: Felicia [name], female    DOB: Jan 09, 1961, 61 y.o.   MRN: [id number]  This visit was conducted in person.  BP 134/68   Pulse 67   Temp (!) 97.4 F (36.3 C) (Temporal)   Ht 5' 2.5" (1.588 m)   Wt 133 lb 8 oz (60.6 kg)   SpO2 99%   BMI 24.03 kg/m    CC: CPE Subjective:   HPI: Felicia [name] is a 61 y.o. female presenting on 08/20/2022 for Annual Exam   T1DM - difficult time controlling sugars, followed by [facility], latest changed to novolin R with meals along with novolog flexpen for correction. Currently off basal insulin. Difficulty using insulin pump due to hypoglycemia. Finds long acting insulin makes her need more short acting insulin. Considering switching practices. Uses Dexcom G7.   CAD s/p CABG with early graft failure of all 3 vein grafts - sees Dr Royann Shivers, angina improved on higher beta blocker. Continues repatha (intolerant to statins). Long acting nitrates caused headache. To consider Ranexa. Sees Q6 months.   Preventative: COLONOSCOPY 07/2016 - WNL (Pyrtle) Mammogram with OBGYN, latest 2023.  Well woman exam - with Dr Billy [facility] last seen 06/2022  Lung cancer screen - not eligible  DEXA scan - done remotely (told osteopenia) upcoming scheduled next month (followed by GYN)  Lung cancer screening - not eligible  Flu shot - yearly  COVID vaccine - Pfizer 02/2020, 05/2020, no booster  Tdap 08/2011, due for 2nd  Pneumovax-23 2004. Prevnar-20 06/2021 Shingrix - discussed, she will return for this Advanced directive discussion -  Seat belt use discussed Sunscreen use discussed. No changing moles on skin.  Non smoker  Alcohol - none  Dentist - q6 mo  Eye exam - sees Bulakowski - upcoming appt next month  Bowel - no constipation  Bladder - intermittent urge incontinence, not too bothersome at this time. No stress incontinence symptoms. 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 Interim medical history since our last visit reviewed. Allergies and medications reviewed and updated. Outpatient Medications Prior to Visit  Medication Sig Dispense Refill   aspirin 81 MG EC tablet Take 1 tablet (81 mg total) by mouth daily.     Continuous Blood Gluc Sensor (DEXCOM G7 SENSOR) MISC 3 each by Does not apply route every 30 (thirty) days. Apply 1 sensor every 10 days 9 each 4   esomeprazole (NEXIUM) 40 MG capsule Take 1 capsule (40 mg total) by mouth daily. Patient needs office visit for further refills 90 capsule 0   Glucagon 3 MG/DOSE POWD Place 3 mg into the nose once as needed for up to 1 dose. 1 each 11   insulin aspart (NOVOLOG FLEXPEN) 100 UNIT/ML FlexPen Use up to 15 units a day for high blood sugar correction. 15 mL 3   insulin regular (NOVOLIN R) 100 units/mL injection INJECT 6 UNITS TOTAL INTO THE SKIN 3 (THREE) TIMES DAILY BEFORE MEALS. 30 mL 3   metoprolol succinate (TOPROL XL) 25 MG 24 hr tablet Take 1 tablet (25 mg total) by mouth daily. 90 tablet 3   mirabegron ER (MYRBETRIQ) 25 MG TB24 tablet Take 1 tablet (25 mg total) by mouth daily.  Multiple Vitamin (MULTIVITAMIN) tablet Take 1 tablet by mouth daily.     nitroGLYCERIN (NITROSTAT) 0.4 MG SL tablet Place 1 tablet (0.4 mg total) under the tongue every 5 (five) minutes as needed for chest pain. 25 tablet 3   REPATHA SURECLICK 500 MG/ML SOAJ INJECT 140 MG INTO THE SKIN EVERY 14 (FOURTEEN) DAYS. 6 mL 3   TECHLITE INSULIN SYRINGE 31G X 15/64" 0.3 ML MISC USE UP TO 5 TIMES DAILY TO INJECT INSULIN DAILY 300 each 1   insulin NPH Human (NOVOLIN N) 100 UNIT/ML injection Inject 0.04-0.06 mLs (4-6 Units total) into the skin daily before breakfast. 9 mL 1   No facility-administered medications prior to visit.      Per HPI unless specifically indicated in ROS section below Review of Systems  Constitutional:  Negative for activity change, appetite change, chills, fatigue, fever and unexpected weight change.  HENT:  Negative for hearing loss.   Eyes:  Negative for visual disturbance.  Respiratory:  Positive for cough (allergic). Negative for chest tightness, shortness of breath and wheezing.   Cardiovascular:  Negative for chest pain, palpitations and leg swelling.  Gastrointestinal:  Negative for abdominal distention, abdominal pain, blood in stool, constipation, diarrhea, nausea and vomiting.  Genitourinary:  Negative for difficulty urinating and hematuria.  Musculoskeletal:  Negative for arthralgias, myalgias and neck pain.  Skin:  Negative for rash.  Neurological:  Negative for dizziness, seizures, syncope and headaches.  Hematological:  Negative for adenopathy. Does not bruise/bleed easily.  Psychiatric/Behavioral:  Negative for dysphoric mood. The patient is not nervous/anxious.     Objective:  BP 134/68   Pulse 67   Temp (!) 97.4 F (36.3 C) (Temporal)   Ht 5' 2.5" (1.588 m)   Wt 133 lb 8 oz (60.6 kg)   SpO2 99%   BMI 24.03 kg/m   Wt Readings from Last 3 Encounters:  08/20/22 133 lb 8 oz (60.6 kg)  07/02/22 134 lb 7.2 oz (61 kg)  06/28/22 133 lb (60.3 kg)      Physical Exam Vitals and nursing note reviewed.  Constitutional:      Appearance: Normal appearance. She is not ill-appearing.  HENT:     Head: Normocephalic and atraumatic.     Right Ear: Tympanic membrane, ear canal and external ear normal. There is no impacted cerumen.     Left Ear: Tympanic membrane, ear canal and external ear normal. There is no impacted cerumen.  Eyes:     General:        Right eye: No discharge.        Left eye: No discharge.     Extraocular Movements: Extraocular movements intact.     Conjunctiva/sclera: Conjunctivae normal.     Pupils: Pupils are equal, round, and reactive to light.  Neck:      Thyroid: No thyroid mass or thyromegaly.  Cardiovascular:     Rate and Rhythm: Normal rate and regular rhythm.     Pulses: Normal pulses.     Heart sounds: Normal heart sounds. No murmur heard. Pulmonary:     Effort: Pulmonary effort is normal. No respiratory distress.     Breath sounds: Normal breath sounds. No wheezing, rhonchi or rales.  Abdominal:     General: Bowel sounds are normal. There is no distension.     Palpations: Abdomen is soft. There is no mass.     Tenderness: There is no abdominal tenderness. There is no guarding or rebound.     Hernia: No hernia is  present.  Musculoskeletal:     Cervical back: Normal range of motion and neck supple. No rigidity.     Right lower leg: No edema.     Left lower leg: No edema.  Lymphadenopathy:     Cervical: No cervical adenopathy.  Skin:    General: Skin is warm and dry.     Findings: No rash.  Neurological:     General: No focal deficit present.     Mental Status: She is alert. Mental status is at baseline.  Psychiatric:        Mood and Affect: Mood normal.        Behavior: Behavior normal.       Results for orders placed or performed in visit on 08/13/22  Fructosamine  Result Value Ref Range   Fructosamine 324 (H) 205 - 285 umol/L  Microalbumin / creatinine urine ratio  Result Value Ref Range   Microalb, Ur 1.7 0.0 - 1.9 mg/dL   Creatinine,U 15.4 mg/dL   Microalb Creat Ratio 2.0 0.0 - 30.0 mg/g  Comprehensive metabolic panel  Result Value Ref Range   Sodium 138 135 - 145 mEq/L   Potassium 4.7 3.5 - 5.1 mEq/L   Chloride 103 96 - 112 mEq/L   CO2 28 19 - 32 mEq/L   Glucose, Bld 217 (H) 70 - 99 mg/dL   BUN 13 6 - 23 mg/dL   Creatinine, Ser 0.08 0.40 - 1.20 mg/dL   Total Bilirubin 0.4 0.2 - 1.2 mg/dL   Alkaline Phosphatase 78 39 - 117 U/L   AST 20 0 - 37 U/L   ALT 15 0 - 35 U/L   Total Protein 6.8 6.0 - 8.3 g/dL   Albumin 3.9 3.5 - 5.2 g/dL   GFR 67.61 (L) >95.09 mL/min   Calcium 8.9 8.4 - 10.5 mg/dL  Lipid  panel  Result Value Ref Range   Cholesterol 138 0 - 200 mg/dL   Triglycerides 326.7 0.0 - 149.0 mg/dL   HDL 12.45 >80.99 mg/dL   VLDL 83.3 0.0 - 82.5 mg/dL   LDL Cholesterol 66 0 - 99 mg/dL   Total CHOL/HDL Ratio 3    NonHDL 88.91     Assessment & Plan:   Problem List Items Addressed This Visit     Health maintenance examination - Primary (Chronic)    Preventative protocols reviewed and updated unless pt declined. Discussed healthy diet and lifestyle.       Type 1 diabetes mellitus with circulatory complication, with long-term current use of insulin (HCC)    Followed by endo, considering 2nd opinion in [place].       Hyperlipidemia due to type 1 diabetes mellitus (HCC)    Chronic, stable period on repatha- continue. The ASCVD Risk score (Arnett DK, et al., 2019) failed to calculate for the following reasons:   The patient has a prior MI or stroke diagnosis       Essential hypertension    Chronic, stable on toprol XL       Reflux esophagitis    Continues nexium 40mg  daily.       S/P CABG (coronary artery bypass graft)   CAD (coronary artery disease)    Appreciate cardiology care.       Urge urinary incontinence    Sees GYN, recently started on myrbetriq.       Relevant Medications   mirabegron ER (MYRBETRIQ) 25 MG TB24 tablet   Renal insufficiency    Continue to monitor.  No orders of the defined types were placed in this encounter.  No orders of the defined types were placed in this encounter.    Patient instructions: We will request latest mammogram from Dr Jorene Minors office [facility]).  Return for nurse visit for shingles shot and Tdap. Return in 2-6 months for second shingles shot.  Good to see you today Return as needed or in 1 year for next physical.   Follow up plan: Return in about 1 year (around 08/21/2023), or if symptoms worsen or fail to improve, for annual exam, prior fasting for blood work.  [name], MD

## 2022-08-20 NOTE — Assessment & Plan Note (Signed)
Continues nexium 40mg daily.  

## 2022-08-20 NOTE — Assessment & Plan Note (Signed)
Chronic, stable period on repatha- continue. The ASCVD Risk score (Arnett DK, et al., 2019) failed to calculate for the following reasons:   The patient has a prior MI or stroke diagnosis

## 2022-08-20 NOTE — Assessment & Plan Note (Signed)
Continue to monitor

## 2022-08-20 NOTE — Assessment & Plan Note (Signed)
Preventative protocols reviewed and updated unless pt declined. Discussed healthy diet and lifestyle.  

## 2022-08-20 NOTE — Assessment & Plan Note (Signed)
Chronic, stable on toprol XL °

## 2022-08-20 NOTE — Patient Instructions (Addendum)
We will request latest mammogram from Dr Kennith Maes office [facility]).  Return for nurse visit for shingles shot and Tdap. Return in 2-6 months for second shingles shot.  Good to see you today Return as needed or in 1 year for next physical.   Health Maintenance for Postmenopausal Women Menopause is a normal process in which your ability to get pregnant comes to an end. This process happens slowly over many months or years, usually between the ages of 59 and 1. Menopause is complete when you have missed your menstrual period for 12 months. It is important to talk with your health care provider about some of the most common conditions that affect women after menopause (postmenopausal women). These include heart disease, cancer, and bone loss (osteoporosis). Adopting a healthy lifestyle and getting preventive care can help to promote your health and wellness. The actions you take can also lower your chances of developing some of these common conditions. What are the signs and symptoms of menopause? During menopause, you may have the following symptoms: Hot flashes. These can be moderate or severe. Night sweats. Decrease in sex drive. Mood swings. Headaches. Tiredness (fatigue). Irritability. Memory problems. Problems falling asleep or staying asleep. Talk with your health care provider about treatment options for your symptoms. Do I need hormone replacement therapy? Hormone replacement therapy is effective in treating symptoms that are caused by menopause, such as hot flashes and night sweats. Hormone replacement carries certain risks, especially as you become older. If you are thinking about using estrogen or estrogen with progestin, discuss the benefits and risks with your health care provider. How can I reduce my risk for heart disease and stroke? The risk of heart disease, heart attack, and stroke increases as you age. One of the causes may be a change in the body's hormones during  menopause. This can affect how your body uses dietary fats, triglycerides, and cholesterol. Heart attack and stroke are medical emergencies. There are many things that you can do to help prevent heart disease and stroke. Watch your blood pressure High blood pressure causes heart disease and increases the risk of stroke. This is more likely to develop in people who have high blood pressure readings or are overweight. Have your blood pressure checked: Every 3-5 years if you are 59-57 years of age. Every year if you are 59 years old or older. Eat a healthy diet  Eat a diet that includes plenty of vegetables, fruits, low-fat dairy products, and lean protein. Do not eat a lot of foods that are high in solid fats, added sugars, or sodium. Get regular exercise Get regular exercise. This is one of the most important things you can do for your health. Most adults should: Try to exercise for at least 150 minutes each week. The exercise should increase your heart rate and make you sweat (moderate-intensity exercise). Try to do strengthening exercises at least twice each week. Do these in addition to the moderate-intensity exercise. Spend less time sitting. Even light physical activity can be beneficial. Other tips Work with your health care provider to achieve or maintain a healthy weight. Do not use any products that contain nicotine or tobacco. These products include cigarettes, chewing tobacco, and vaping devices, such as e-cigarettes. If you need help quitting, ask your health care provider. Know your numbers. Ask your health care provider to check your cholesterol and your blood sugar (glucose). Continue to have your blood tested as directed by your health care provider. Do I need screening  for cancer? Depending on your health history and family history, you may need to have cancer screenings at different stages of your life. This may include screening for: Breast cancer. Cervical cancer. Lung  cancer. Colorectal cancer. What is my risk for osteoporosis? After menopause, you may be at increased risk for osteoporosis. Osteoporosis is a condition in which bone destruction happens more quickly than new bone creation. To help prevent osteoporosis or the bone fractures that can happen because of osteoporosis, you may take the following actions: If you are 47-72 years old, get at least 1,000 mg of calcium and at least 600 international units (IU) of vitamin D per day. If you are older than age 75 but younger than age 81, get at least 1,200 mg of calcium and at least 600 international units (IU) of vitamin D per day. If you are older than age 37, get at least 1,200 mg of calcium and at least 800 international units (IU) of vitamin D per day. Smoking and drinking excessive alcohol increase the risk of osteoporosis. Eat foods that are rich in calcium and vitamin D, and do weight-bearing exercises several times each week as directed by your health care provider. How does menopause affect my mental health? Depression may occur at any age, but it is more common as you become older. Common symptoms of depression include: Feeling depressed. Changes in sleep patterns. Changes in appetite or eating patterns. Feeling an overall lack of motivation or enjoyment of activities that you previously enjoyed. Frequent crying spells. Talk with your health care provider if you think that you are experiencing any of these symptoms. General instructions See your health care provider for regular wellness exams and vaccines. This may include: Scheduling regular health, dental, and eye exams. Getting and maintaining your vaccines. These include: Influenza vaccine. Get this vaccine each year before the flu season begins. Pneumonia vaccine. Shingles vaccine. Tetanus, diphtheria, and pertussis (Tdap) booster vaccine. Your health care provider may also recommend other immunizations. Tell your health care provider if  you have ever been abused or do not feel safe at home. Summary Menopause is a normal process in which your ability to get pregnant comes to an end. This condition causes hot flashes, night sweats, decreased interest in sex, mood swings, headaches, or lack of sleep. Treatment for this condition may include hormone replacement therapy. Take actions to keep yourself healthy, including exercising regularly, eating a healthy diet, watching your weight, and checking your blood pressure and blood sugar levels. Get screened for cancer and depression. Make sure that you are up to date with all your vaccines. This information is not intended to replace advice given to you by your health care provider. Make sure you discuss any questions you have with your health care provider. Document Revised: 03/12/2021 Document Reviewed: 03/12/2021 Elsevier Patient Education  [place].

## 2022-08-20 NOTE — Assessment & Plan Note (Signed)
Sees GYN, recently started on myrbetriq.

## 2022-08-20 NOTE — Assessment & Plan Note (Signed)
Appreciate cardiology care.  °

## 2022-08-23 ENCOUNTER — Encounter: Payer: Self-pay | Admitting: Family Medicine

## 2022-08-28 ENCOUNTER — Telehealth: Payer: Self-pay | Admitting: Family Medicine

## 2022-08-28 NOTE — Telephone Encounter (Signed)
Caller name: Museum/gallery conservator From [facility]   On DPR?: No  Call back number: [phone]  Provider they see: Ria Bush, MD  Reason for call:  Amber call stating that Dr.Keri isn't taking any new diabetic pt at the moment. Amber stated that she tried to schedule pt with Dr.Doerr (who's taking new diabetic pt) and patient refuse the appt.

## 2022-08-28 NOTE — Telephone Encounter (Signed)
Provider and CMA are aware. Referral has been closed [facility]

## 2022-09-09 ENCOUNTER — Ambulatory Visit: Payer: BC Managed Care – PPO | Admitting: Internal Medicine

## 2022-09-10 DIAGNOSIS — E103293 Type 1 diabetes mellitus with mild nonproliferative diabetic retinopathy without macular edema, bilateral: Secondary | ICD-10-CM | POA: Diagnosis not present

## 2022-09-10 DIAGNOSIS — H524 Presbyopia: Secondary | ICD-10-CM | POA: Diagnosis not present

## 2022-09-10 DIAGNOSIS — H5213 Myopia, bilateral: Secondary | ICD-10-CM | POA: Diagnosis not present

## 2022-09-10 LAB — HM DIABETES EYE EXAM

## 2022-09-13 ENCOUNTER — Encounter: Payer: Self-pay | Admitting: Family Medicine

## 2022-09-13 DIAGNOSIS — E10319 Type 1 diabetes mellitus with unspecified diabetic retinopathy without macular edema: Secondary | ICD-10-CM | POA: Insufficient documentation

## 2022-09-16 ENCOUNTER — Telehealth: Payer: Self-pay

## 2022-09-16 ENCOUNTER — Encounter: Payer: Self-pay | Admitting: Family Medicine

## 2022-09-16 ENCOUNTER — Encounter: Payer: Self-pay | Admitting: Cardiovascular Disease

## 2022-09-16 NOTE — Telephone Encounter (Signed)
Plz triage pt.  °

## 2022-09-16 NOTE — Telephone Encounter (Signed)
Please see pt message to Dr Reece Agar and also see pt message to cardiology who has already answered question. Sending to Dr Reece Agar and Misty Stanley CMA.

## 2022-09-16 NOTE — Telephone Encounter (Signed)
See phone note and cardiology pt message. Sending to Dr Reece Agar and Misty Stanley CMA.

## 2022-09-25 ENCOUNTER — Encounter: Payer: Self-pay | Admitting: Podiatry

## 2022-09-25 ENCOUNTER — Ambulatory Visit (INDEPENDENT_AMBULATORY_CARE_PROVIDER_SITE_OTHER): Payer: BC Managed Care – PPO | Admitting: Podiatry

## 2022-09-25 DIAGNOSIS — E109 Type 1 diabetes mellitus without complications: Secondary | ICD-10-CM | POA: Diagnosis not present

## 2022-09-25 DIAGNOSIS — L603 Nail dystrophy: Secondary | ICD-10-CM | POA: Diagnosis not present

## 2022-09-25 DIAGNOSIS — E119 Type 2 diabetes mellitus without complications: Secondary | ICD-10-CM | POA: Diagnosis not present

## 2022-09-25 NOTE — Progress Notes (Signed)
  Subjective:  Patient ID: Felicia [name], female    DOB: 10/02/1961,  MRN: [id number]  Chief Complaint  Patient presents with   Diabetes    Diabetic foot exam   Nail Problem    Thick painful toenails   Hammer Toe    61 y.o. female presents with the above complaint. History confirmed with patient.  She notes the toes are curling up and there is a space developing between them.  She also notes thickening of the nails and a coworker and told her that this could be nail fungus and should have it examined.  She has type 1 diabetes, her A1c is 7.1% and has always been fairly well controlled.  Objective:  Physical Exam: warm, good capillary refill, no trophic changes or ulcerative lesions, normal DP and PT pulses, normal monofilament exam, normal sensory exam, and she has nail dystrophy of multiple toenails with thickening, no clear evidence of onychomycosis or fungus, she has semireducible hammertoe deformities.  Assessment:   1. Nail dystrophy   2. Type 1 diabetes mellitus without complication (HCC)   3. Encounter for diabetic foot exam [facility])      Plan:  Patient was evaluated and treated and all questions answered.  Patient educated on diabetes. Discussed proper diabetic foot care and discussed risks and complications of disease. Educated patient in depth on reasons to return to the office immediately should he/she discover anything concerning or new on the feet. All questions answered. Discussed proper shoes as well.  At risk diabetic foot exam performed today she is overall at fairly low risk for complication she has good circulation and no neuropathy.  We discussed etiology and treatment options of hammertoe deformities currently these are fairly asymptomatic for her and she will let me know if this worsens and we will take x-rays and reevaluate   She has nail dystrophy as well.  Discussed etiology and treatment of this.  I think most of this is secondary to her hammertoe deformities  and foot position.  She will let me know if this worsens currently do not see any active signs of mycotic infection, if this is worsening we will plan for oral therapy   Return if symptoms worsen or fail to improve.

## 2022-09-30 ENCOUNTER — Ambulatory Visit
Admission: RE | Admit: 2022-09-30 | Discharge: 2022-09-30 | Disposition: A | Discharge status: Home / Self Care | Payer: BC Managed Care – PPO | Source: Ambulatory Visit | Attending: Obstetrics and Gynecology | Admitting: Obstetrics and Gynecology

## 2022-09-30 DIAGNOSIS — M810 Age-related osteoporosis without current pathological fracture: Secondary | ICD-10-CM | POA: Diagnosis not present

## 2022-09-30 DIAGNOSIS — M8580 Other specified disorders of bone density and structure, unspecified site: Secondary | ICD-10-CM | POA: Insufficient documentation

## 2022-10-03 ENCOUNTER — Ambulatory Visit (INDEPENDENT_AMBULATORY_CARE_PROVIDER_SITE_OTHER): Payer: BC Managed Care – PPO | Admitting: Internal Medicine

## 2022-10-03 ENCOUNTER — Encounter: Payer: Self-pay | Admitting: Internal Medicine

## 2022-10-03 ENCOUNTER — Encounter: Payer: Self-pay | Admitting: Cardiovascular Disease

## 2022-10-03 VITALS — BP 112/64 | HR 64 | Temp 97.0°F | Ht 62.5 in | Wt 130.0 lb

## 2022-10-03 DIAGNOSIS — J0110 Acute frontal sinusitis, unspecified: Secondary | ICD-10-CM | POA: Diagnosis not present

## 2022-10-03 DIAGNOSIS — S46811A Strain of other muscles, fascia and tendons at shoulder and upper arm level, right arm, initial encounter: Secondary | ICD-10-CM | POA: Diagnosis not present

## 2022-10-03 MED ORDER — AMOXICILLIN 500 MG PO TABS: 1000.0000 mg | ORAL_TABLET | Freq: Two times a day (BID) | ORAL | 0 refills | Status: DC

## 2022-10-03 NOTE — Progress Notes (Signed)
Subjective:    Patient ID: Felicia [name], female    DOB: Jun 04, 1961, 61 y.o.   MRN: [id number]  HPI Here due to respiratory infection  Started about a week ago Thought it was allergies at first--was just congested Then husband and son in law got sick. They were COVID negative Congestion, sore throat, cough (not bad) Gets nasal drainage as well Did use left over amoxil 875mg  daily x 2 and seems to feel some better (11/28 and 29)  No fever Some post nasal drip Occasional sputum--clear No ear pain Has frontal headache Some trouble breathing yesterday---vaporizer and afrin helped  Some pain in back of neck--preceded the respiratory symptoms Gets really tight (points to trap on right) Feels better if she keeps neck flexed or extended Tried tylenol--slight help  Current Outpatient Medications on File Prior to Visit  Medication Sig Dispense Refill   aspirin 81 MG EC tablet Take 1 tablet (81 mg total) by mouth daily.     Continuous Blood Gluc Sensor (DEXCOM G7 SENSOR) MISC 3 each by Does not apply route every 30 (thirty) days. Apply 1 sensor every 10 days 9 each 4   esomeprazole (NEXIUM) 40 MG capsule Take 1 capsule (40 mg total) by mouth daily. Patient needs office visit for further refills 90 capsule 0   Glucagon 3 MG/DOSE POWD Place 3 mg into the nose once as needed for up to 1 dose. 1 each 11   insulin NPH-regular Human (70-30) 100 UNIT/ML injection Inject 5 Units into the skin daily.     insulin regular (NOVOLIN R) 100 units/mL injection INJECT 6 UNITS TOTAL INTO THE SKIN 3 (THREE) TIMES DAILY BEFORE MEALS. 30 mL 3   metoprolol succinate (TOPROL XL) 25 MG 24 hr tablet Take 1 tablet (25 mg total) by mouth daily. 90 tablet 3   mirabegron ER (MYRBETRIQ) 25 MG TB24 tablet Take 1 tablet (25 mg total) by mouth daily.     Multiple Vitamin (MULTIVITAMIN) tablet Take 1 tablet by mouth daily.     REPATHA SURECLICK XX123456 MG/ML SOAJ INJECT 140 MG INTO THE SKIN EVERY 14 (FOURTEEN) DAYS. 6 mL 3    TECHLITE INSULIN SYRINGE 31G X 15/64" 0.3 ML MISC USE UP TO 5 TIMES DAILY TO INJECT INSULIN DAILY 300 each 1   nitroGLYCERIN (NITROSTAT) 0.4 MG SL tablet Place 1 tablet (0.4 mg total) under the tongue every 5 (five) minutes as needed for chest pain. 25 tablet 3   No current facility-administered medications on file prior to visit.    Allergies  Allergen Reactions   Protonix [Pantoprazole] Swelling    Facial Swelling   Toujeo Solostar [Insulin Glargine] Shortness Of Breath   Lactose Intolerance (Gi)     GI   Magnesium-Containing Compounds Swelling and Other (See Comments)    limbs   Adhesive [Tape] Rash and Other (See Comments)    Band aids    Past Medical History:  Diagnosis Date   Allergy    Anemia    past history long ago   Borderline HTN (hypertension)    CAD (coronary artery disease)    a. 02/2020 Cath: LM nl, LAD large, 90p, 30m, D1 90p, LCX nl, OM 60-70p, RCA dominant w/ dampening upon engagement of ostium. 70p/m, RPDA 50. EF 55-65%.   COVID-19 virus infection 02/2021   Diabetes ([place])    Disorder of kidney    has abd kidney (right)   GERD (gastroesophageal reflux disease)    History of diabetic gastroparesis  History of echocardiogram    a. 01/2013 Echo: EF 55-65%, no rwma. Mild MR. Nl RV fxn.   HLD (hyperlipidemia)    Hx: UTI (urinary tract infection)    IBS (irritable bowel syndrome)    Nephrolithiasis 2005   Osteopenia 2012   per pt   Reflux esophagitis    Systolic murmur [id number]   mild mitral insuff   Type 1 diabetes mellitus with diabetic retinopathy without macular edema (HCC)    dx at 61 y/o, background retinopathy    Past Surgical History:  Procedure Laterality Date   APPENDECTOMY     CESAREAN SECTION  1985   COLONOSCOPY  07/2016   WNL ([place])   CORONARY ARTERY BYPASS GRAFT N/A 02/21/2020   Procedure: CORONARY ARTERY BYPASS GRAFTING (CABG) times five using left internal mammary and right leg saphenous vein;  Surgeon: Lajuana Matte, MD;   Location: [place];  Service: Open Heart Surgery;  Laterality: N/A;   ESOPHAGOGASTRODUODENOSCOPY  07/2016   LA Grade C reflux esophagitis, concern for stasis/dysmotility ([place])   LAPAROSCOPIC APPENDECTOMY N/A 09/01/2015   Procedure: APPENDECTOMY LAPAROSCOPIC;  Surgeon: Judeth Horn, MD;  Location: [place];  Service: General;  Laterality: N/A;   LEFT HEART CATH AND CORONARY ANGIOGRAPHY N/A 02/17/2020   Procedure: LEFT HEART CATH AND CORONARY ANGIOGRAPHY;  Surgeon: Minna Merritts, MD;  Location: [facility];  Service: Cardiovascular;  Laterality: N/A;   LEFT HEART CATH AND CORS/GRAFTS ANGIOGRAPHY N/A 02/29/2020   early failure of 4/5 bypass grafts - medically managed Angelena Form, Annita Brod, MD)   ORIF FEMUR FRACTURE Left 1996   Corrected by patient in history 2016   PARTIAL HYSTERECTOMY  2000   paps by Erling Con[facility] [place]   TEE WITHOUT CARDIOVERSION N/A 02/21/2020   Procedure: TRANSESOPHAGEAL ECHOCARDIOGRAM (TEE);  Surgeon: Lajuana Matte, MD;  Location: [facility];  Service: Open Heart Surgery;  Laterality: N/A;   TONSILLECTOMY AND ADENOIDECTOMY  1975   TRIGGER FINGER RELEASE Left 12/2010   thumb    Family History  Problem Relation Age of Onset   Diabetes Maternal Grandmother    Heart failure Maternal Grandmother    COPD Maternal Grandmother        smoker   Thyroid disease Maternal Grandmother    Vascular Disease Maternal Grandmother    Colon cancer Maternal Grandfather    Colon polyps Mother    Breast cancer Other    CAD Brother 32       stent   Esophageal cancer Neg Hx    Rectal cancer Neg Hx    Stomach cancer Neg Hx     Social History   Socioeconomic History   Marital status: Married    Spouse name: Not on file   Number of children: 1   Years of education: 16   Highest education level: Bachelor's degree (e.g., BA, AB, BS)  Occupational History   Occupation: HR Marketing executive: OTHER    Comment: Sport and exercise psychologist  Tobacco Use   Smoking status:  Never    Passive exposure: Never   Smokeless tobacco: Never  Vaping Use   Vaping Use: Never used  Substance and Sexual Activity   Alcohol use: No    Alcohol/week: 0.0 standard drinks of alcohol   Drug use: No   Sexual activity: Yes    Birth control/protection: None    Comment: Married  Other Topics Concern   Not on file  Social History 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  Has one daughter.  Education: college.      1 dog   Social Determinants of Corporate investment banker Strain: Not on file  Food Insecurity: Not on file  Transportation Needs: Not on file  Physical Activity: Not on file  Stress: Not on file  Social Connections: Not on file  Intimate Partner Violence: Not on file   Review of Systems No N/V Appetite is off     Objective:   Physical Exam Constitutional:      Appearance: Normal appearance.  HENT:     Head:     Comments: Mild frontal tenderness    Right Ear: Tympanic membrane and ear canal normal.     Left Ear: Tympanic membrane and ear canal normal.     Mouth/Throat:     Pharynx: No oropharyngeal exudate or posterior oropharyngeal erythema.  Neck:     Comments: Tightness and tenderness along right trapezius Pulmonary:     Effort: Pulmonary effort is normal.     Breath sounds: Normal breath sounds. No wheezing or rales.  Neurological:     Mental Status: She is alert.            Assessment & Plan:

## 2022-10-03 NOTE — Patient Instructions (Signed)
Try heat wrap and over the counter diclofenac gel for your neck

## 2022-10-03 NOTE — Assessment & Plan Note (Signed)
1 week of symptoms and persistent May have improved with underdosed amoxil Will treat with 1000mg  bid analgesics

## 2022-10-03 NOTE — Telephone Encounter (Signed)
No contraindication to Prolia, OK to take

## 2022-10-03 NOTE — Assessment & Plan Note (Signed)
Discussed heat and topical diclofenac 

## 2022-10-05 ENCOUNTER — Telehealth: Payer: BC Managed Care – PPO | Admitting: Nurse Practitioner

## 2022-10-05 DIAGNOSIS — R051 Acute cough: Secondary | ICD-10-CM | POA: Diagnosis not present

## 2022-10-05 MED ORDER — PROMETHAZINE-DM 6.25-15 MG/5ML PO SYRP: 5.0000 mL | ORAL_SOLUTION | Freq: Four times a day (QID) | ORAL | 0 refills | Status: DC | PRN

## 2022-10-05 MED ORDER — ALBUTEROL SULFATE HFA 108 (90 BASE) MCG/ACT IN AERS: 2.0000 | INHALATION_SPRAY | Freq: Four times a day (QID) | RESPIRATORY_TRACT | 0 refills | Status: DC | PRN

## 2022-10-05 MED ORDER — PREDNISONE 20 MG PO TABS: 40.0000 mg | ORAL_TABLET | Freq: Every day | ORAL | 0 refills | Status: DC

## 2022-10-05 NOTE — Progress Notes (Signed)
Virtual Visit Consent   TEDDIE CURD, you are scheduled for a virtual visit with Mary-Margaret Daphine Deutscher, FNP, a [facility] provider, today.     Just as with appointments in the office, your consent must be obtained to participate.  Your consent will be active for this visit and any virtual visit you may have with one of our providers in the next 365 days.     If you have a MyChart account, a copy of this consent can be sent to you electronically.  All virtual visits are billed to your insurance company just like a traditional visit in the office.    As this is a virtual visit, video technology does not allow for your provider to perform a traditional examination.  This may limit your provider's ability to fully assess your condition.  If your provider identifies any concerns that need to be evaluated in person or the need to arrange testing (such as labs, EKG, etc.), we will make arrangements to do so.     Although advances in technology are sophisticated, we cannot ensure that it will always work on either your end or our end.  If the connection with a video visit is poor, the visit may have to be switched to a telephone visit.  With either a video or telephone visit, we are not always able to ensure that we have a secure connection.     I need to obtain your verbal consent now.   Are you willing to proceed with your visit today? YES   LINEA CALLES has provided verbal consent on 10/05/2022 for a virtual visit (video or telephone).   Mary-Margaret Daphine Deutscher, FNP   Date: 10/05/2022 8:36 AM   Virtual Visit via Video Note   I, Mary-Margaret Daphine Deutscher, connected with JANIELLE MITTELSTADT ([id number], [birth year]) on 10/05/22 at  8:45 AM EST by a video-enabled telemedicine application and verified that I am speaking with the correct person using two identifiers.  Location: Patient: Virtual Visit Location Patient: Home Provider: Virtual Visit Location Provider: Mobile   I discussed the limitations of  evaluation and management by telemedicine and the availability of in person appointments. The patient expressed understanding and agreed to proceed.    History of Present Illness: NAAVA JANEWAY is a 61 y.o. who identifies as a female who was assigned female at birth, and is being seen today for sinusitis.  HPI: She sr=tarted on amoxicillin on Sunday. She is some better other then the cough.  Sinusitis This is a new problem. The current episode started 1 to 4 weeks ago. The problem has been waxing and waning since onset. There has been no fever. Her pain is at a severity of 6/10. She is experiencing no pain. Associated symptoms include congestion, coughing, sinus pressure and a sore throat. Pertinent negatives include no chills or shortness of breath. (PND) Treatments tried: augmentin, delsym and tessalon perles.    Review of Systems  Constitutional:  Negative for chills and fever.  HENT:  Positive for congestion, sinus pressure and sore throat.   Respiratory:  Positive for cough. Negative for shortness of breath.     Problems:  Patient Active Problem List   Diagnosis Date Noted   Trapezius strain, right, initial encounter 10/03/2022   Diabetic retinopathy associated with type 1 diabetes mellitus (HCC) 09/13/2022   Acquired trigger finger of right ring finger 06/14/2022   History of hysterectomy 04/19/2022   Acute non-recurrent frontal sinusitis 02/27/2022   Seasonal allergic rhinitis 02/27/2022  Health maintenance examination 06/16/2021   Urge urinary incontinence 06/16/2021   Renal insufficiency 06/16/2021   Pain of left breast 02/09/2021   Osteopenia 11/28/2020   BPPV (benign paroxysmal positional vertigo) 04/22/2020   Dizziness 03/31/2020   Non-ST elevation (NSTEMI) myocardial infarction [facility])    Syncope due to orthostatic hypotension 02/26/2020   CAD (coronary artery disease) 02/26/2020   Radius and ulna distal fracture 02/26/2020   Orbital fracture (HCC) 02/26/2020   S/P  CABG (coronary artery bypass graft) 02/21/2020   Unstable angina (HCC) 02/17/2020   Reflux esophagitis 06/28/2019   Carpal tunnel syndrome 04/23/2018   Systolic murmur 12/07/2012   Type 1 diabetes mellitus with circulatory complication, with long-term current use of insulin (HCC) 06/25/2010   Hyperlipidemia due to type 1 diabetes mellitus (HCC) 06/25/2010   Diabetic gastroparesis associated with type 1 diabetes mellitus (HCC) 06/25/2010    Allergies:  Allergies  Allergen Reactions   Protonix [Pantoprazole] Swelling    Facial Swelling   Toujeo Solostar [Insulin Glargine] Shortness Of Breath   Lactose Intolerance (Gi)     GI   Magnesium-Containing Compounds Swelling and Other (See Comments)    limbs   Adhesive [Tape] Rash and Other (See Comments)    Band aids   Medications:  Current Outpatient Medications:    amoxicillin (AMOXIL) 500 MG tablet, Take 2 tablets (1,000 mg total) by mouth 2 (two) times daily for 7 days., Disp: 28 tablet, Rfl: 0   aspirin 81 MG EC tablet, Take 1 tablet (81 mg total) by mouth daily., Disp: , Rfl:    Continuous Blood Gluc Sensor (DEXCOM G7 SENSOR) MISC, 3 each by Does not apply route every 30 (thirty) days. Apply 1 sensor every 10 days, Disp: 9 each, Rfl: 4   esomeprazole (NEXIUM) 40 MG capsule, Take 1 capsule (40 mg total) by mouth daily. Patient needs office visit for further refills, Disp: 90 capsule, Rfl: 0   Glucagon 3 MG/DOSE POWD, Place 3 mg into the nose once as needed for up to 1 dose., Disp: 1 each, Rfl: 11   insulin NPH-regular Human (70-30) 100 UNIT/ML injection, Inject 5 Units into the skin daily., Disp: , Rfl:    insulin regular (NOVOLIN R) 100 units/mL injection, INJECT 6 UNITS TOTAL INTO THE SKIN 3 (THREE) TIMES DAILY BEFORE MEALS., Disp: 30 mL, Rfl: 3   metoprolol succinate (TOPROL XL) 25 MG 24 hr tablet, Take 1 tablet (25 mg total) by mouth daily., Disp: 90 tablet, Rfl: 3   mirabegron ER (MYRBETRIQ) 25 MG TB24 tablet, Take 1 tablet (25 mg  total) by mouth daily., Disp: , Rfl:    Multiple Vitamin (MULTIVITAMIN) tablet, Take 1 tablet by mouth daily., Disp: , Rfl:    nitroGLYCERIN (NITROSTAT) 0.4 MG SL tablet, Place 1 tablet (0.4 mg total) under the tongue every 5 (five) minutes as needed for chest pain., Disp: 25 tablet, Rfl: 3   REPATHA SURECLICK 140 MG/ML SOAJ, INJECT 140 MG INTO THE SKIN EVERY 14 (FOURTEEN) DAYS., Disp: 6 mL, Rfl: 3   TECHLITE INSULIN SYRINGE 31G X 15/64" 0.3 ML MISC, USE UP TO 5 TIMES DAILY TO INJECT INSULIN DAILY, Disp: 300 each, Rfl: 1  Observations/Objective: Patient is well-developed, well-nourished in no acute distress.  Resting comfortably  at home.  Head is normocephalic, atraumatic.  No labored breathing.  Speech is clear and coherent with logical content.  Patient is alert and oriented at baseline.  Deep wet cough  Assessment and Plan:  Elliot Dally in today with  chief complaint of Sinusitis   1. Acute cough 1. Take meds as prescribed 2. Use a cool mist humidifier especially during the winter months and when heat has been humid. 3. Use saline nose sprays frequently 4. Saline irrigations of the nose can be very helpful if done frequently.  * 4X daily for 1 week*  * Use of a nettie pot can be helpful with this. Follow directions with this* 5. Drink plenty of fluids 6. Keep thermostat turn down low 7.For any cough or congestion- promethazine DM 8. For fever or aces or pains- take tylenol or ibuprofen appropriate for age and weight.  * for fevers greater than 101 orally you may alternate ibuprofen and tylenol every  3 hours.    - albuterol (VENTOLIN HFA) 108 (90 Base) MCG/ACT inhaler; Inhale 2 puffs into the lungs every 6 (six) hours as needed for wheezing or shortness of breath.  Dispense: 8 g; Refill: 0 - promethazine-dextromethorphan (PROMETHAZINE-DM) 6.25-15 MG/5ML syrup; Take 5 mLs by mouth 4 (four) times daily as needed for cough.  Dispense: 118 mL; Refill: 0 - predniSONE (DELTASONE)  20 MG tablet; Take 2 tablets (40 mg total) by mouth daily with breakfast for 5 days. 2 po daily for 5 days  Dispense: 10 tablet; Refill: 0  Check blood sugar prior to each meal and if over 200 give self extra 2 u of insulin   Follow Up Instructions: I discussed the assessment and treatment plan with the patient. The patient was provided an opportunity to ask questions and all were answered. The patient agreed with the plan and demonstrated an understanding of the instructions.  A copy of instructions were sent to the patient via MyChart.  The patient was advised to call back or seek an in-person evaluation if the symptoms worsen or if the condition fails to improve as anticipated.  Time:  I spent 13 minutes with the patient via telehealth technology discussing the above problems/concerns.    Mary-Margaret Daphine Deutscher, FNP

## 2022-10-05 NOTE — Patient Instructions (Signed)
1. Take meds as prescribed 2. Use a cool mist humidifier especially during the winter months and when heat has been humid. 3. Use saline nose sprays frequently 4. Saline irrigations of the nose can be very helpful if done frequently.  * 4X daily for 1 week*  * Use of a nettie pot can be helpful with this. Follow directions with this* 5. Drink plenty of fluids 6. Keep thermostat turn down low 7.For any cough or congestion- prometazine DM 8. For fever or aces or pains- take tylenol or ibuprofen appropriate for age and weight.  * for fevers greater than 101 orally you may alternate ibuprofen and tylenol every  3 hours.

## 2022-10-09 ENCOUNTER — Ambulatory Visit: Payer: BC Managed Care – PPO | Admitting: Family Medicine

## 2022-10-09 ENCOUNTER — Encounter: Payer: Self-pay | Admitting: Family Medicine

## 2022-10-09 VITALS — BP 122/70 | HR 63 | Temp 97.6°F | Ht 62.5 in | Wt 134.0 lb

## 2022-10-09 DIAGNOSIS — R051 Acute cough: Secondary | ICD-10-CM

## 2022-10-09 DIAGNOSIS — R0981 Nasal congestion: Secondary | ICD-10-CM | POA: Diagnosis not present

## 2022-10-09 MED ORDER — PROMETHAZINE-DM 6.25-15 MG/5ML PO SYRP: 5.0000 mL | ORAL_SOLUTION | Freq: Four times a day (QID) | ORAL | 0 refills | Status: DC | PRN

## 2022-10-09 NOTE — Patient Instructions (Addendum)
I think you have persistent inflammation possibly allergy related - start daily antihistamine allegra over the counter as well as flonase nasal spray 2 sprays into each nose once daily.  Phenergan cough syrup refilled.  Let us know if fever >101, worsening productive cough or colored mucous production.

## 2022-10-09 NOTE — Progress Notes (Signed)
Patient ID: Felicia [name], female    DOB: 14-Nov-1960, 61 y.o.   MRN: [id number]  This visit was conducted in person.  BP 122/70   Pulse 63   Temp 97.6 F (36.4 C) (Temporal)   Ht 5' 2.5" (1.588 m)   Wt 134 lb (60.8 kg)   SpO2 100%   BMI 24.12 kg/m    CC: cough Subjective:   HPI: Felicia [name] is a 61 y.o. female presenting on 10/09/2022 for Sinus Problem (Ongoing nasal congestion, cough and HA. Seen for previously, sxs improving. )   Ongoing cough present for 2 weeks now. Saw Dr Alphonsus Sias 10/03/2022, treated for sinusitis course with amoxicillin 1000mg  BID. Some improvement noted however with persistent cough.   Seen in follow up for virtual visit for ongoing cough 10/05/2022, treated with prednisone 40mg  5d course, phenergan cough syrup, and albuterol inhaler. Again some limited improvement.  Sugars have been manageable on prednisone course.   Chest pain is better, ST is better but still with significant sinus congestion, PNdrainage, fatigue, forehead pressure. Blowing nose with significant clear nasal mucous production.   No fevers/chills, ear or tooth pain, facial pain.   Husband and SIL were sick with similar symptoms - they are now better.     Relevant past medical, surgical, family and social history reviewed and updated as indicated. Interim medical history since our last visit reviewed. Allergies and medications reviewed and updated. Outpatient Medications Prior to Visit  Medication Sig Dispense Refill   aspirin 81 MG EC tablet Take 1 tablet (81 mg total) by mouth daily.     Continuous Blood Gluc Sensor (DEXCOM G7 SENSOR) MISC 3 each by Does not apply route every 30 (thirty) days. Apply 1 sensor every 10 days 9 each 4   esomeprazole (NEXIUM) 40 MG capsule Take 1 capsule (40 mg total) by mouth daily. Patient needs office visit for further refills 90 capsule 0   Glucagon 3 MG/DOSE POWD Place 3 mg into the nose once as needed for up to 1 dose. 1 each 11   insulin  NPH-regular Human (70-30) 100 UNIT/ML injection Inject 5 Units into the skin daily.     insulin regular (NOVOLIN R) 100 units/mL injection INJECT 6 UNITS TOTAL INTO THE SKIN 3 (THREE) TIMES DAILY BEFORE MEALS. 30 mL 3   metoprolol succinate (TOPROL XL) 25 MG 24 hr tablet Take 1 tablet (25 mg total) by mouth daily. 90 tablet 3   mirabegron ER (MYRBETRIQ) 25 MG TB24 tablet Take 1 tablet (25 mg total) by mouth daily.     Multiple Vitamin (MULTIVITAMIN) tablet Take 1 tablet by mouth daily.     REPATHA SURECLICK 140 MG/ML SOAJ INJECT 140 MG INTO THE SKIN EVERY 14 (FOURTEEN) DAYS. 6 mL 3   TECHLITE INSULIN SYRINGE 31G X 15/64" 0.3 ML MISC USE UP TO 5 TIMES DAILY TO INJECT INSULIN DAILY 300 each 1   nitroGLYCERIN (NITROSTAT) 0.4 MG SL tablet Place 1 tablet (0.4 mg total) under the tongue every 5 (five) minutes as needed for chest pain. 25 tablet 3   albuterol (VENTOLIN HFA) 108 (90 Base) MCG/ACT inhaler Inhale 2 puffs into the lungs every 6 (six) hours as needed for wheezing or shortness of breath. 8 g 0   amoxicillin (AMOXIL) 500 MG tablet Take 2 tablets (1,000 mg total) by mouth 2 (two) times daily for 7 days. 28 tablet 0   predniSONE (DELTASONE) 20 MG tablet Take 2 tablets (40 mg total) by mouth daily  with breakfast for 5 days. 2 po daily for 5 days 10 tablet 0   promethazine-dextromethorphan (PROMETHAZINE-DM) 6.25-15 MG/5ML syrup Take 5 mLs by mouth 4 (four) times daily as needed for cough. 118 mL 0   No facility-administered medications prior to visit.     Per HPI unless specifically indicated in ROS section below Review of Systems  Objective:  BP 122/70   Pulse 63   Temp 97.6 F (36.4 C) (Temporal)   Ht 5' 2.5" (1.588 m)   Wt 134 lb (60.8 kg)   SpO2 100%   BMI 24.12 kg/m   Wt Readings from Last 3 Encounters:  10/09/22 134 lb (60.8 kg)  10/03/22 130 lb (59 kg)  08/20/22 133 lb 8 oz (60.6 kg)      Physical Exam Vitals and nursing note reviewed.  Constitutional:      Appearance:  Normal appearance. She is not ill-appearing.  HENT:     Head: Normocephalic and atraumatic.     Right Ear: Tympanic membrane, ear canal and external ear normal. There is no impacted cerumen.     Left Ear: Tympanic membrane, ear canal and external ear normal. There is no impacted cerumen.     Nose: Congestion and rhinorrhea present. Rhinorrhea is clear.     Right Turbinates: Swollen and pale. Not enlarged.     Left Turbinates: Swollen and pale. Not enlarged.     Right Sinus: Frontal sinus tenderness present. No maxillary sinus tenderness.     Left Sinus: Frontal sinus tenderness present. No maxillary sinus tenderness.     Mouth/Throat:     Mouth: Mucous membranes are moist.     Pharynx: Oropharynx is clear. No oropharyngeal exudate or posterior oropharyngeal erythema.  Eyes:     Extraocular Movements: Extraocular movements intact.     Conjunctiva/sclera: Conjunctivae normal.     Pupils: Pupils are equal, round, and reactive to light.  Cardiovascular:     Rate and Rhythm: Normal rate and regular rhythm.     Pulses: Normal pulses.     Heart sounds: Normal heart sounds. No murmur heard. Pulmonary:     Effort: Pulmonary effort is normal. No respiratory distress.     Breath sounds: Normal breath sounds. No wheezing, rhonchi or rales.  Lymphadenopathy:     Head:     Right side of head: No submental, submandibular, tonsillar, preauricular or posterior auricular adenopathy.     Left side of head: No submental, submandibular, tonsillar, preauricular or posterior auricular adenopathy.     Cervical: No cervical adenopathy.     Right cervical: No superficial cervical adenopathy.    Left cervical: No superficial cervical adenopathy.     Upper Body:     Right upper body: No supraclavicular adenopathy.     Left upper body: No supraclavicular adenopathy.  Skin:    Findings: No rash.  Neurological:     Mental Status: She is alert.  Psychiatric:        Mood and Affect: Mood normal.         Behavior: Behavior normal.       Results for orders placed or performed in visit on 09/13/22  HM DIABETES EYE EXAM  Result Value Ref Range   HM Diabetic Eye Exam Retinopathy (A) No Retinopathy    Assessment & Plan:   Problem List Items Addressed This Visit       Unprioritized   Sinus congestion - Primary    Persistent sinus congestion with cough after initial treatment for acute  sinusitis with high dose amoxicillin course, then prednisone, albuterol, phenergan cough syrup. No signs of ongoing bacterial infection. Anticipate more allergic vs post-infectious inflammation.  Rec antihistamine, intranasal steroid, will also refill phenergan cough syrup.  Update if fever >101, worsening productive cough, or worsening sinus pain, purulent mucous production.       Other Visit Diagnoses     Acute cough       Relevant Medications   promethazine-dextromethorphan (PROMETHAZINE-DM) 6.25-15 MG/5ML syrup        Meds ordered this encounter  Medications   promethazine-dextromethorphan (PROMETHAZINE-DM) 6.25-15 MG/5ML syrup    Sig: Take 5 mLs by mouth 4 (four) times daily as needed for cough.    Dispense:  118 mL    Refill:  0   No orders of the defined types were placed in this encounter.    Patient Instructions  I think you have persistent inflammation possibly allergy related - start daily antihistamine allegra over the counter as well as flonase nasal spray 2 sprays into each nose once daily.  Phenergan cough syrup refilled.  Let us know if fever >101, worsening productive cough or colored mucous production.   Follow up plan: Return if symptoms worsen or fail to improve.  [name], MD

## 2022-10-09 NOTE — Assessment & Plan Note (Signed)
Persistent sinus congestion with cough after initial treatment for acute sinusitis with high dose amoxicillin course, then prednisone, albuterol, phenergan cough syrup. No signs of ongoing bacterial infection. Anticipate more allergic vs post-infectious inflammation.  Rec antihistamine, intranasal steroid, will also refill phenergan cough syrup.  Update if fever >101, worsening productive cough, or worsening sinus pain, purulent mucous production.

## 2022-10-16 ENCOUNTER — Encounter: Payer: Self-pay | Admitting: Internal Medicine

## 2022-10-27 ENCOUNTER — Other Ambulatory Visit: Payer: Self-pay | Admitting: Nurse Practitioner

## 2022-10-27 DIAGNOSIS — R051 Acute cough: Secondary | ICD-10-CM

## 2022-10-30 DIAGNOSIS — M47812 Spondylosis without myelopathy or radiculopathy, cervical region: Secondary | ICD-10-CM | POA: Diagnosis not present

## 2022-10-30 DIAGNOSIS — M542 Cervicalgia: Secondary | ICD-10-CM | POA: Diagnosis not present

## 2022-10-30 DIAGNOSIS — S161XXA Strain of muscle, fascia and tendon at neck level, initial encounter: Secondary | ICD-10-CM | POA: Diagnosis not present

## 2022-11-01 ENCOUNTER — Ambulatory Visit (INDEPENDENT_AMBULATORY_CARE_PROVIDER_SITE_OTHER): Payer: BC Managed Care – PPO | Admitting: Internal Medicine

## 2022-11-01 ENCOUNTER — Encounter: Payer: Self-pay | Admitting: Internal Medicine

## 2022-11-01 VITALS — BP 122/70 | HR 65 | Ht 62.5 in | Wt 136.2 lb

## 2022-11-01 DIAGNOSIS — E1069 Type 1 diabetes mellitus with other specified complication: Secondary | ICD-10-CM | POA: Diagnosis not present

## 2022-11-01 DIAGNOSIS — E103293 Type 1 diabetes mellitus with mild nonproliferative diabetic retinopathy without macular edema, bilateral: Secondary | ICD-10-CM

## 2022-11-01 DIAGNOSIS — E785 Hyperlipidemia, unspecified: Secondary | ICD-10-CM

## 2022-11-01 DIAGNOSIS — M810 Age-related osteoporosis without current pathological fracture: Secondary | ICD-10-CM | POA: Diagnosis not present

## 2022-11-01 LAB — POCT GLYCOSYLATED HEMOGLOBIN (HGB A1C): Hemoglobin A1C: 6.6 % — AB (ref 4.0–5.6)

## 2022-11-01 NOTE — Progress Notes (Addendum)
Patient ID: Felicia [name], female   DOB: Mar 22, 1961, 61 y.o.   MRN: [id number]  HPI: Felicia [name] is a 61 y.o.-year-old female, presenting for follow-up for DM1, dx in 16 (age 61), uncontrolled, with complications (CAD - s/p 5v CABG, mild retinopathy, gastroparesis). Last visit 4 months ago.  Since last visit, she tried to change endocrinologists to start seeing Dr. Buddy Duty, but he was not accepting new patients so she returns to see me today.  Interim hx: No increased urination, blurry vision, nausea, chest pain. Started on a muscle relaxer 2 days ago for neck pain - severe arthritis in neck.  It was recommended that she uses a steroid, but she did not start this yet.  Insulin pump: -  >> stopped 02/2022  CGM: - Dexcom G6 >> G7  Reviewed HbA1c levels: Lab Results  Component Value Date   HGBA1C 7.1 (A) 06/28/2022   HGBA1C 7.3 (A) 03/18/2022   HGBA1C 6.9 (A) 11/30/2021   HGBA1C 7.6 (H) 10/05/2021   HGBA1C 7.9 (H) 06/11/2021   HGBA1C 7.5 (A) 11/30/2020   HGBA1C 7.0 (A) 07/28/2020   HGBA1C 8.9 (H) 02/17/2020   HGBA1C 8.5 (H) 09/23/2019   HGBA1C 9.0 (H) 06/17/2019   HGBA1C 8.0 (A) 09/04/2018   HGBA1C 7.7 (A) 04/17/2018   HGBA1C 7.5 12/16/2017   HGBA1C 7.8 08/14/2017   HGBA1C 7.5 04/08/2017   HGBA1C 7.5 10/29/2016   HGBA1C 7.1 07/30/2016   HGBA1C 7.9 05/06/2016   HGBA1C 7.3 12/28/2015   HGBA1C 7.6 09/27/2015   HGBA1C 7.7 (H) 06/07/2015   HGBA1C 7.5 (H) 03/23/2015   HGBA1C 8.2 (H) 11/15/2014   She is on: - NPH 4  units at night (1-2 am) -added 03/2022 >> 6 >> increased to 7 units daily - at 9-10 pm, 2 weeks ago  - R insulin - at the start of the meal or correction- 4-8 units - insulin to carb ratio (ICR)  B'fast: 5:1 (if going for a walk: 10:1) Lunch: 15:1 Dinner: 25:1 - target 150 - insulin sensitivity factor (ISF) 60: 150-200: + 1 unit 201-260: + 2 units 261-320: + 3 units 321-380: + 4 units >380: + 5 units Do not correct bedtime sugars <300, and only inject 1  unit then.  For the snack at night, do not take more than 1-2 units of regular insulin!  Tried Metformin ER 1000 mg with dinner but stopped due to lack of effect. Tried [place] >> she did not feel that this worked as well as Lantus. Tried Toujeo >> allergy: CP, SOB. She was previously on insulin pump for 5 years (~2010) but she did not like it.  Sugars were not better on this.    She was on an OmniPod 5 insulin pump but felt her sugars were higher >> stopped 02/2022.  Meter: ReliOn  She checks her blood sugars more than 4 times a day with her Dexcom CGM:   Previously:  Previously:   Lowest sugar was 38 >>... 50s >> 45 (CGM but 62 by glucometer) >> 52; + hypoglycemia awareness in the 70s.  No recent hyper or hypoglycemia admissions.  She has a glucagon kit at home. Highest sugar was 500s (bagel)  ... >> 300s >> 345 >> 300s.   Pt's meals are: - Breakfast: protein bar + fruit >> granola bar (25 g carbs) - 10 am snack  - Lunch: apple + cheese, carrots - Snack bar: 15g carbs - Glucerna - Dinner: meat + 1/2 backed potato + veggies/salad - Snacks:  popcorn, salty snacks: Potato chips  -No CKD, last BUN/creatinine:  Lab Results  Component Value Date   BUN 13 08/13/2022   CREATININE 1.16 08/13/2022  Previously on quinapril, now off.  + HL; last set of lipids: Lab Results  Component Value Date   CHOL 138 08/13/2022   HDL 49.40 08/13/2022   LDLCALC 66 08/13/2022   LDLDIRECT 120.0 06/17/2019   TRIG 116.0 08/13/2022   CHOLHDL 3 08/13/2022  She tried pravastatin, lovastatin, Zetia, even adding co-Q10 but she still had muscle cramps >>  now on Repatha, tolerated well. She also uses uses 2 creams: Theraworx + Muscle calm.    - last eye exam was on 09/10/2022: + DR  - no numbness and tingling in her feet. Last foot exam: 09/25/2022.  On ASA 81.  Latest TSH is normal: Lab Results  Component Value Date   TSH 2.85 06/11/2021   She also has a history of HTN. She has recurrent  vaginal candidiasis.  ROS: + See HPI  I reviewed pt's medications, allergies, PMH, social hx, family hx, and changes were documented in the history of present illness. Otherwise, unchanged from my initial visit note.  Past Medical History:  Diagnosis Date   Allergy    Anemia    past history long ago   Borderline HTN (hypertension)    CAD (coronary artery disease)    a. 02/2020 Cath: LM nl, LAD large, 90p, 80m D1 90p, LCX nl, OM 60-70p, RCA dominant w/ dampening upon engagement of ostium. 70p/m, RPDA 50. EF 55-65%.   COVID-19 virus infection 02/2021   Diabetes ([place]    Disorder of kidney    has abd kidney (right)   GERD (gastroesophageal reflux disease)    History of diabetic gastroparesis    History of echocardiogram    a. 01/2013 Echo: EF 55-65%, no rwma. Mild MR. Nl RV fxn.   HLD (hyperlipidemia)    Hx: UTI (urinary tract infection)    IBS (irritable bowel syndrome)    Nephrolithiasis 2005   Osteopenia 2012   per pt   Reflux esophagitis    Systolic murmur [id number]  mild mitral insuff   Type 1 diabetes mellitus with diabetic retinopathy without macular edema (HCC)    dx at 61y/o, background retinopathy   Past Surgical History:  Procedure Laterality Date   APPENDECTOMY     CESAREAN SECTION  1985   COLONOSCOPY  07/2016   WNL ([place])   CORONARY ARTERY BYPASS GRAFT N/A 02/21/2020   Procedure: CORONARY ARTERY BYPASS GRAFTING (CABG) times five using left internal mammary and right leg saphenous vein;  Surgeon: LLajuana Matte MD;  [facility] [facility]  Service: Open Heart Surgery;  Laterality: N/A;   ESOPHAGOGASTRODUODENOSCOPY  07/2016   LA Grade C reflux esophagitis, concern for stasis/dysmotility ([place])   LAPAROSCOPIC APPENDECTOMY N/A 09/01/2015   Procedure: APPENDECTOMY LAPAROSCOPIC;  Surgeon: JJudeth Horn MD;  [facility] [facility]  Service: General;  Laterality: N/A;   LEFT HEART CATH AND CORONARY ANGIOGRAPHY N/A 02/17/2020   Procedure: LEFT HEART CATH AND CORONARY  ANGIOGRAPHY;  Surgeon: GMinna Merritts MD;  [facility];  Service: Cardiovascular;  Laterality: N/A;   LEFT HEART CATH AND CORS/GRAFTS ANGIOGRAPHY N/A 02/29/2020   early failure of 4/5 bypass grafts - medically managed (Angelena Form CAnnita Brod MD)   ORIF FEMUR FRACTURE Left 1996   Corrected by patient in history 2016   PARTIAL HYSTERECTOMY  2000   [place]  TEE WITHOUT CARDIOVERSION N/A 02/21/2020   Procedure: TRANSESOPHAGEAL ECHOCARDIOGRAM (TEE);  Surgeon: Lajuana Matte, MD;  Location: [place];  Service: Open Heart Surgery;  Laterality: N/A;   TONSILLECTOMY AND ADENOIDECTOMY  1975   TRIGGER FINGER RELEASE Left 12/2010   thumb   Social History   Socioeconomic History   Marital status: Married    Spouse name: Not on file   Number of children: 1   Years of education: 16   Highest education level: Bachelor's degree (e.g., BA, AB, BS)  Occupational History   Occupation: HR Marketing executive: OTHER    Comment: Sport and exercise psychologist  Tobacco Use   Smoking status: Never    Passive exposure: Never   Smokeless tobacco: Never  Vaping Use   Vaping Use: Never used  Substance and Sexual Activity   Alcohol use: No    Alcohol/week: 0.0 standard drinks of alcohol   Drug use: No   Sexual activity: Yes    Birth control/protection: None    Comment: Married  Other Topics Concern   Not on file  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  Has one daughter.  Education: college.      1 dog   Social Determinants of Radio broadcast assistant Strain: Not on file  Food Insecurity: Not on file  Transportation Needs: Not on file  Physical Activity: Not on file  Stress: Not on file  Social Connections: Not on file  Intimate Partner Violence: Not on file   Current Outpatient Medications on File Prior to Visit  Medication Sig Dispense Refill   aspirin 81 MG EC  tablet Take 1 tablet (81 mg total) by mouth daily.     Continuous Blood Gluc Sensor (DEXCOM G7 SENSOR) MISC 3 each by Does not apply route every 30 (thirty) days. Apply 1 sensor every 10 days 9 each 4   esomeprazole (NEXIUM) 40 MG capsule Take 1 capsule (40 mg total) by mouth daily. Patient needs office visit for further refills 90 capsule 0   Glucagon 3 MG/DOSE POWD Place 3 mg into the nose once as needed for up to 1 dose. 1 each 11   insulin NPH-regular Human (70-30) 100 UNIT/ML injection Inject 5 Units into the skin daily.     insulin regular (NOVOLIN R) 100 units/mL injection INJECT 6 UNITS TOTAL INTO THE SKIN 3 (THREE) TIMES DAILY BEFORE MEALS. 30 mL 3   metoprolol succinate (TOPROL XL) 25 MG 24 hr tablet Take 1 tablet (25 mg total) by mouth daily. 90 tablet 3   mirabegron ER (MYRBETRIQ) 25 MG TB24 tablet Take 1 tablet (25 mg total) by mouth daily.     Multiple Vitamin (MULTIVITAMIN) tablet Take 1 tablet by mouth daily.     nitroGLYCERIN (NITROSTAT) 0.4 MG SL tablet Place 1 tablet (0.4 mg total) under the tongue every 5 (five) minutes as needed for chest pain. 25 tablet 3   promethazine-dextromethorphan (PROMETHAZINE-DM) 6.25-15 MG/5ML syrup Take 5 mLs by mouth 4 (four) times daily as needed for cough. 118 mL 0   REPATHA SURECLICK 546 MG/ML SOAJ INJECT 140 MG INTO THE SKIN EVERY 14 (FOURTEEN) DAYS. 6 mL 3   TECHLITE INSULIN SYRINGE 31G X 15/64" 0.3 ML MISC USE UP TO 5 TIMES DAILY TO INJECT INSULIN DAILY 300 each 1   No current facility-administered medications on file prior to visit.   Allergies  Allergen Reactions   Protonix [Pantoprazole] Swelling    Facial Swelling   Toujeo Solostar [Insulin Glargine] Shortness Of Breath   Lactose Intolerance (Gi)     GI   Magnesium-Containing Compounds Swelling and Other (See Comments)    limbs   Adhesive [Tape] Rash and Other (See Comments)    Band aids   Family History  Problem Relation Age of Onset   Diabetes Maternal Grandmother    Heart  failure Maternal Grandmother    COPD Maternal Grandmother        smoker   Thyroid disease Maternal Grandmother    Vascular Disease Maternal Grandmother    Colon cancer Maternal Grandfather    Colon polyps Mother    Breast cancer Other    CAD Brother 51       stent   Esophageal cancer Neg Hx    Rectal cancer Neg Hx    Stomach cancer Neg Hx    PE: BP 122/70 (BP Location: Left Arm, Patient Position: Sitting, Cuff Size: Small)   Pulse 65   Ht 5' 2.5" (1.588 m)   Wt 136 lb 3.2 oz (61.8 kg)   SpO2 99%   BMI 24.51 kg/m    Wt Readings from Last 3 Encounters:  11/01/22 136 lb 3.2 oz (61.8 kg)  10/09/22 134 lb (60.8 kg)  10/03/22 130 lb (59 kg)   Constitutional: normal weight, in NAD Eyes: no exophthalmos ENT: no masses palpated in neck, no cervical lymphadenopathy Cardiovascular: RRR, No MRG Respiratory: CTA B Musculoskeletal: no deformities Skin: no rashes Neurological: no tremor with outstretched hands  ASSESSMENT: 1. DM1, uncontrolled, with complications - CAD - s/p 5v CABG - 02/21/2020 - Mild DR - Gastroparesis - This is the reason why she is on regular insulin, and she is taking it 10 minutes  rather than 30 minutes before eating  She had low CBGs with Novolog!  2. HL  PLAN:  1.  Complex patient with difficult to control type 1 diabetes, on a basal/bolus insulin regimen, after she stopped her insulin pump as she felt that her sugars are higher on the OmniPod 5 device.  She previously had an insulin pump more than 10 years ago but she did not feel that her sugars improved on this and was very reticent to try it.  She finally accepted to retry a pump, but again, she was not happy with it and stopped the OmniPod 5 in 02/2022. -At last visit, HbA1c was slightly better, at 7.1% -Her sugars are usually very high in the second half of the night and much better controlled later in the day.  Unfortunately, we were not able to find a good regimen for her to counteract the dawn  phenomenon. -Of note, we did not start a GLP-1 receptor agonist due to history of gastroparesis.  I did recommend Farxiga in the past but she is not on this. -At last visit, reviewing the CGM trends, sugars were lower later in the day and they were increasing after approximately 2 AM and remained elevated until approximately 2 PM.  She was taking 4 units of NPH between 1 to 2 AM.  She was setting an alarm to remember to take it.  She felt that sugars improved slightly after doing so.  However, she was worried that she was repeatedly trying to correct the high blood sugars in the first half of the day, ending up with stacking insulin and having low blood sugars before and after dinner.  Afterwards, she was  trying to break hypoglycemia and this could have caused the high blood sugars during the night.  I advised her to try the following strategies: Increase her NPH at night to 6 units or even more Possibly add regular insulin to her NPH at night, but this does have the risk of lower blood sugars during the night so would not be preferred Decrease the snacks after dinner, which she is mostly using to prevent her blood sugars from dropping overnight, if her sugars are lower Exercise around dinnertime rather than in the morning  -We also discussed about the 15 to 30 minutes lag time between capillary blood sugars and interstitial blood sugars -I advised her to expect difference between these especially when she is correcting a high or low blood sugars or when she is eating, especially high glycemic index foods. CGM interpretation: -At today's visit, we reviewed her CGM downloads: It appears that 78% of values are in target range (goal >70%), while 21% are higher than 180 (goal <25%), and 1% are lower than 70 (goal <4%).  The calculated average blood sugar is 150.  The projected HbA1c for the next 3 months (GMI) is 6.9%. -Reviewing the CGM trends for the last 2 weeks, these are some of the best blood sugars I  have seen for her.  Upon questioning, she reports that she finally increase the dose of her NPH from 6 to 7 units 2 weeks ago.  The sugars appear to be much less variable with this change.  She still has to inject regular insulin at different times of the day and night to bring the sugars down with subsequent occasional hypoglycemia around lunchtime.  We discussed that she can try to increase the NPH even more, to 8 units and be on and see how she responds to this.  I am hoping that she does not need to correct the high blood sugars with regular insulin as much if she does increase the dose of intermediate acting insulin. -She mentions that she also lower the alarm notification threshold for the sensor from 180-170 and this helps prevent higher blood sugars.  Will continue this. -We discussed how to change the doses of her insulin in case she uses prednisone.  I advised her to use more regular insulin before meals.  However, she does not think that she would want to start the steroid. -I advised her to Patient Instructions  Please increase: - NPH insulin 8 units during the night (may need to increase further)  Continue: - R insulin: B'fast: 5:1 (if going for a walk: 10:1) Lunch: 15:1 Dinner: 25:1 - target 150 - insulin sensitivity factor (ISF) 60: 150-200: + 1 unit 201-260: + 2 units 261-320: + 3 units 321-380: + 4 units >380: + 5 units Do not correct bedtime sugars <300, and only inject 1 unit then.  For the snack at night, do not take more than 1-2 units of regular insulin!  Please return in 4 months.  - we checked her HbA1c: 6.6% (best in many years!) - advised to check sugars at different times of the day - 4x a day, rotating check times - advised for yearly eye exams >> she is UTD - return to clinic in 4 months     2. HL -Reviewed latest lipid panel from 08/2022: LDL at goal, as are the rest of her lipid fractions: Lab Results  Component Value Date   CHOL 138 08/13/2022   HDL  49.40 08/13/2022   LDLCALC 66 08/13/2022  LDLDIRECT 120.0 06/17/2019   TRIG 116.0 08/13/2022   CHOLHDL 3 08/13/2022  -She continues on Repatha and fish oil without side effects:  Philemon Kingdom, MD PhD [facility] Endocrinology

## 2022-11-01 NOTE — Patient Instructions (Addendum)
Please increase: - NPH insulin 8 units during the night (may need to increase further)  Continue: - R insulin: B'fast: 5:1 (if going for a walk: 10:1) Lunch: 15:1 Dinner: 25:1 - target 150 - insulin sensitivity factor (ISF) 60: 150-200: + 1 unit 201-260: + 2 units 261-320: + 3 units 321-380: + 4 units >380: + 5 units Do not correct bedtime sugars <300, and only inject 1 unit then.  For the snack at night, do not take more than 1-2 units of regular insulin!  Please return in 4 months.

## 2022-11-07 ENCOUNTER — Encounter: Payer: Self-pay | Admitting: Family Medicine

## 2022-11-07 DIAGNOSIS — M810 Age-related osteoporosis without current pathological fracture: Secondary | ICD-10-CM

## 2022-11-08 ENCOUNTER — Ambulatory Visit: Payer: BC Managed Care – PPO | Admitting: Family Medicine

## 2022-11-13 DIAGNOSIS — M542 Cervicalgia: Secondary | ICD-10-CM | POA: Diagnosis not present

## 2022-11-17 ENCOUNTER — Encounter: Payer: Self-pay | Admitting: Internal Medicine

## 2022-11-17 ENCOUNTER — Encounter: Payer: Self-pay | Admitting: Cardiovascular Disease

## 2022-11-30 DIAGNOSIS — M542 Cervicalgia: Secondary | ICD-10-CM | POA: Diagnosis not present

## 2022-12-03 ENCOUNTER — Other Ambulatory Visit: Payer: Self-pay | Admitting: Internal Medicine

## 2022-12-04 ENCOUNTER — Other Ambulatory Visit (HOSPITAL_COMMUNITY): Payer: Self-pay

## 2022-12-06 ENCOUNTER — Telehealth: Payer: Self-pay

## 2022-12-06 DIAGNOSIS — G894 Chronic pain syndrome: Secondary | ICD-10-CM | POA: Diagnosis not present

## 2022-12-06 DIAGNOSIS — M542 Cervicalgia: Secondary | ICD-10-CM | POA: Diagnosis not present

## 2022-12-06 NOTE — Telephone Encounter (Signed)
Pharmacy Patient Advocate Encounter  Prior Authorization for REPATHA 140 MG/ML INJ has been approved.    Effective dates: 12/04/22 through 12/03/23   Received notification from [facility] that prior authorization for REPATHA 140 MG/ML INJ is needed.    PA submitted on 12/03/22 Key [id number] Status is pending  Karie Soda, [place] Patient Advocate Specialist Direct Number: [phone] Fax: [phone]

## 2022-12-24 ENCOUNTER — Other Ambulatory Visit: Payer: Self-pay | Admitting: Cardiovascular Disease

## 2022-12-28 ENCOUNTER — Encounter: Payer: Self-pay | Admitting: Internal Medicine

## 2022-12-29 ENCOUNTER — Other Ambulatory Visit: Payer: Self-pay

## 2022-12-30 ENCOUNTER — Other Ambulatory Visit: Payer: Self-pay | Admitting: Internal Medicine

## 2022-12-30 MED ORDER — INSULIN NPH (HUMAN) (ISOPHANE) 100 UNIT/ML ~~LOC~~ SUSP: 8.0000 [IU] | Freq: Every day | SUBCUTANEOUS | 5 refills | Status: DC

## 2022-12-30 MED ORDER — INSULIN REGULAR HUMAN 100 UNIT/ML IJ SOLN: 4.0000 [IU] | Freq: Three times a day (TID) | INTRAMUSCULAR | 5 refills | Status: DC

## 2023-01-08 ENCOUNTER — Ambulatory Visit: Payer: BC Managed Care – PPO | Attending: Cardiovascular Disease | Admitting: Cardiovascular Disease

## 2023-01-08 ENCOUNTER — Encounter: Payer: Self-pay | Admitting: Cardiovascular Disease

## 2023-01-08 VITALS — BP 160/70 | HR 63 | Ht 64.0 in | Wt 136.2 lb

## 2023-01-08 DIAGNOSIS — E108 Type 1 diabetes mellitus with unspecified complications: Secondary | ICD-10-CM

## 2023-01-08 DIAGNOSIS — R030 Elevated blood-pressure reading, without diagnosis of hypertension: Secondary | ICD-10-CM

## 2023-01-08 DIAGNOSIS — I25708 Atherosclerosis of coronary artery bypass graft(s), unspecified, with other forms of angina pectoris: Secondary | ICD-10-CM

## 2023-01-08 DIAGNOSIS — E782 Mixed hyperlipidemia: Secondary | ICD-10-CM | POA: Diagnosis not present

## 2023-01-08 NOTE — Patient Instructions (Signed)
Medication Instructions:  No changes *If you need a refill on your cardiac medications before your next appointment, please call your pharmacy*  Follow-Up: At [facility], you and your health needs are our priority.  As part of our continuing mission to provide you with exceptional heart care, we have created designated Provider Care Teams.  These Care Teams include your primary Cardiologist (physician) and Advanced Practice Providers (APPs -  Physician Assistants and Nurse Practitioners) who all work together to provide you with the care you need, when you need it.  We recommend signing up for the patient portal called "MyChart".  Sign up information is provided on this After Visit Summary.  MyChart is used to connect with patients for Virtual Visits (Telemedicine).  Patients are able to view lab/test results, encounter notes, upcoming appointments, etc.  Non-urgent messages can be sent to your provider as well.   To learn more about what you can do with MyChart, go to [email or web address].    Your next appointment:   6 month(s)  Provider:   Sanda Klein, MD

## 2023-01-08 NOTE — Progress Notes (Unsigned)
Cardiology Office Note:    Date:  01/09/2023   ID:  Felicia [name], DOB May 06, 1961, MRN [id number]  PCP:  Ria Bush, MD  [facility] Cardiologist:  Sanda Klein, MD  [facility] Electrophysiologist:  None   Referring MD: Ria Bush, MD   Chief Complaint  Patient presents with   Coronary Artery Disease    History of Present Illness:    Felicia [name] is a 62 y.o. female with a hx of CAD s/p CABG April 2021, with early vein graft occlusion but patent LIMA to LAD, type 1 diabetes mellitus on insulin, hypercholesterolemia intolerant to statins on PCSK9 inhibitor.  Previous problems with chest discomfort appear to improve on the higher dose of metoprolol, but she had to reduce the dose again due to severe fatigue.  She has occasional chest discomfort that occurs randomly, not during physical activity.  It is always mild.  On 1 occasion she took sublingual nitroglycerin and did obtain relief, but the symptoms were relieved only after about 15-20 minutes after the nitroglycerin, making it less likely that it was not nitroglycerin that actually helped.  She is still using her treadmill for 20-40 minutes a day and does not have chest discomfort during physical activity.  She has some mild chest discomfort in the office today but this is reproducible with palpation.  She continues to work full-time.  Her blood pressure is unusually high today, even when rechecked, but at home her blood pressure is usually in the 90/50-60 range.  She keeps a detailed daily log of her blood pressure.  These values are more in keeping with previous blood pressure recording in the office as well.  She has not had lower extremity edema, orthopnea, PND or exertional dyspnea.  She denies syncope but has occasional dizziness if she stands up too quickly.  Metabolic control remains good.  Her recent hemoglobin A1c was 6.6% and her LDL cholesterol was 66.  She has very slight elevation in creatinine, most  recently 1.16.  Early postoperative course was complicated by an episode of syncope with fall and R orbital fracture and left wrist fracture and coronary/graft angiography showed that she had lost patency of all the SVGs.  The LIMA to LAD bypass was patent.  No intervention was performed.  Echocardiogram showed that left ventricular wall motion was abnormal, significant for moderate hypokinesis of the apical segments of the anterior and anterolateral walls, but overall LVEF was normal at 55 to 60%.  Diagnostic Dominance: Right      Repeat echocardiogram in August showed a residual apical lateral wall motion abnormality, but normal overall EF.  Her nuclear stress that showed similar findings with an apical scar, no evidence of reversible ischemia and normal left ventricular systolic function.    Past Medical History:  Diagnosis Date   Allergy    Anemia    past history long ago   Borderline HTN (hypertension)    CAD (coronary artery disease)    a. 02/2020 Cath: LM nl, LAD large, 90p, 18m D1 90p, LCX nl, OM 60-70p, RCA dominant w/ dampening upon engagement of ostium. 70p/m, RPDA 50. EF 55-65%.   COVID-19 virus infection 02/2021   [place]    Disorder of kidney    has abd kidney (right)   GERD (gastroesophageal reflux disease)    History of diabetic gastroparesis    History of echocardiogram    a. 01/2013 Echo: EF 55-65%, no rwma. Mild MR. Nl RV fxn.   HLD (hyperlipidemia)  Hx: UTI (urinary tract infection)    IBS (irritable bowel syndrome)    Nephrolithiasis 2005   Osteopenia 2012   per pt   Reflux esophagitis    Systolic murmur [id number]   mild mitral insuff   Type 1 diabetes mellitus with diabetic retinopathy without macular edema (HCC)    dx at 62 y/o, background retinopathy    Past Surgical History:  Procedure Laterality Date   APPENDECTOMY     CESAREAN SECTION  1985   COLONOSCOPY  07/2016   WNL ([place])   CORONARY ARTERY BYPASS GRAFT N/A 02/21/2020    Procedure: CORONARY ARTERY BYPASS GRAFTING (CABG) times five using left internal mammary and right leg saphenous vein;  Surgeon: Lajuana Matte, MD;  Location: [place];  Service: Open Heart Surgery;  Laterality: N/A;   ESOPHAGOGASTRODUODENOSCOPY  07/2016   LA Grade C reflux esophagitis, concern for stasis/dysmotility ([place])   LAPAROSCOPIC APPENDECTOMY N/A 09/01/2015   Procedure: APPENDECTOMY LAPAROSCOPIC;  Surgeon: Judeth Horn, MD;  Location: [place];  Service: General;  Laterality: N/A;   LEFT HEART CATH AND CORONARY ANGIOGRAPHY N/A 02/17/2020   Procedure: LEFT HEART CATH AND CORONARY ANGIOGRAPHY;  Surgeon: Minna Merritts, MD;  Location: [place] CV LAB;  Service: Cardiovascular;  Laterality: N/A;   LEFT HEART CATH AND CORS/GRAFTS ANGIOGRAPHY N/A 02/29/2020   early failure of 4/5 bypass grafts - medically managed Angelena Form, Annita Brod, MD)   ORIF FEMUR FRACTURE Left 1996   Corrected by patient in history 2016   PARTIAL HYSTERECTOMY  2000   paps by Erling Conte [facility] [place]   TEE WITHOUT CARDIOVERSION N/A 02/21/2020   Procedure: TRANSESOPHAGEAL ECHOCARDIOGRAM (TEE);  Surgeon: Lajuana Matte, MD;  Location: [place];  Service: Open Heart Surgery;  Laterality: N/A;   TONSILLECTOMY AND ADENOIDECTOMY  1975   TRIGGER FINGER RELEASE Left 12/2010   thumb    Current Medications: Current Meds  Medication Sig   aspirin 81 MG EC tablet Take 1 tablet (81 mg total) by mouth daily.   Continuous Blood Gluc Sensor (DEXCOM G7 SENSOR) MISC 3 each by Does not apply route every 30 (thirty) days. Apply 1 sensor every 10 days   esomeprazole (NEXIUM) 40 MG capsule Take 1 capsule (40 mg total) by mouth daily. Patient needs office visit for further refills   Glucagon 3 MG/DOSE POWD Place 3 mg into the nose once as needed for up to 1 dose.   insulin NPH Human (HUMULIN N) 100 UNIT/ML injection Inject 0.08 mLs (8 Units total) into the skin at bedtime.   insulin regular (HUMULIN R) 100 units/mL  injection Inject 0.04-0.08 mLs (4-8 Units total) into the skin 3 (three) times daily before meals.   metoprolol succinate (TOPROL-XL) 25 MG 24 hr tablet TAKE 1 TABLET (25 MG TOTAL) BY MOUTH DAILY.   mirabegron ER (MYRBETRIQ) 25 MG TB24 tablet Take 1 tablet (25 mg total) by mouth daily.   Multiple Vitamin (MULTIVITAMIN) tablet Take 1 tablet by mouth daily.   nitroGLYCERIN (NITROSTAT) 0.4 MG SL tablet Place 1 tablet (0.4 mg total) under the tongue every 5 (five) minutes as needed for chest pain.   promethazine-dextromethorphan (PROMETHAZINE-DM) 6.25-15 MG/5ML syrup Take 5 mLs by mouth 4 (four) times daily as needed for cough.   REPATHA SURECLICK [id number] MG/ML SOAJ INJECT 140 MG INTO THE SKIN EVERY 14 (FOURTEEN) DAYS.   TECHLITE INSULIN SYRINGE 31G X 5/16" 0.3 ML MISC USE UP TO 5 TIMES DAILY TO INJECT INSULIN DAILY     Allergies:  Protonix [pantoprazole], Toujeo solostar [insulin glargine], Lactose intolerance (gi), Magnesium-containing compounds, and Adhesive [tape]   Social History   Socioeconomic History   Marital status: Married    Spouse name: Not on file   Number of children: 1   Years of education: 16   Highest education level: Bachelor's degree (e.g., BA, AB, BS)  Occupational History   Occupation: HR Marketing executive: OTHER    Comment: Sport and exercise psychologist  Tobacco Use   Smoking status: Never    Passive exposure: Never   Smokeless tobacco: Never  Vaping Use   Vaping Use: Never used  Substance and Sexual Activity   Alcohol use: No    Alcohol/week: 0.0 standard drinks of alcohol   Drug use: No   Sexual activity: Yes    Birth control/protection: None    Comment: Married  Other Topics Concern   Not on file  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  Has one daughter.  Education: college.      1 dog   Social Determinants of Radio broadcast assistant Strain: Not on file  Food Insecurity: Not  on file  Transportation Needs: Not on file  Physical Activity: Not on file  Stress: Not on file  Social Connections: Not on file     Family History: The patient's family history includes Breast cancer in an other family member; CAD (age of onset: 27) in her brother; COPD in her maternal grandmother; Colon cancer in her maternal grandfather; Colon polyps in her mother; Diabetes in her maternal grandmother; Heart failure in her maternal grandmother; Thyroid disease in her maternal grandmother; Vascular Disease in her maternal grandmother. There is no history of Esophageal cancer, Rectal cancer, or Stomach cancer.  ROS:   Please see the history of present illness.     All other systems reviewed and are negative.  EKGs/Labs/Other Studies Reviewed:    The following studies were reviewed today: Cardiac catheterizations performed on 4/15 (before bypass) and 4/27 (after bypass surgery) Echocardiograms performed on 4/15 and 4/25  Nuclear stress test 08/03/2020    The left ventricular ejection fraction is hyperdynamic (>65%). Nuclear stress EF: 70%. There was no ST segment deviation noted during stress. Defect 1: There is a small defect of severe severity present in the apical anterior and apex location.   There is a small irreversible defect of severe severity present in the apical anterior and apex location consistent with prior infarct in the distal LAD territory, there is no peri-infarct ischemia, overall LVEF is normal.     Echo 06/12/2020  1. Left ventricular ejection fraction, by estimation, is 55 to 60%. The  left ventricle has normal function. The left ventricle has no regional  wall motion abnormalities. Left ventricular diastolic parameters are  consistent with Grade II diastolic  dysfunction (pseudonormalization).   2. Right ventricular systolic function is normal. The right ventricular  size is normal.   3. The mitral valve is normal in structure. Mild mitral valve   regurgitation. No evidence of mitral stenosis.   4. The aortic valve is tricuspid. Aortic valve regurgitation is not  visualized. Mild aortic valve sclerosis is present, with no evidence of  aortic valve stenosis.   5. The inferior vena cava is normal in size with greater than 50%  respiratory variability, suggesting right atrial pressure of 3 mmHg.   EKG:  EKG is ordered  today.  It is unchanged from previous tracings.  Shows normal sinus rhythm, relatively low voltage QRS and QS pattern in leads V1-V2.  There are no acute ischemic repolarization abnormalities.  The QTc is 399 ms.    Recent Labs: 08/13/2022: ALT 15; BUN 13; Creatinine, Ser 1.16; Potassium 4.7; Sodium 138  Recent Lipid Panel    Component Value Date/Time   CHOL 138 08/13/2022 0744   TRIG 116.0 08/13/2022 0744   HDL 49.40 08/13/2022 0744   CHOLHDL 3 08/13/2022 0744   VLDL 23.2 08/13/2022 0744   LDLCALC 66 08/13/2022 0744   LDLDIRECT 120.0 06/17/2019 0738    Physical Exam:    VS:  BP (!) 160/70   Pulse 63   Ht '5\' 4"'$  (1.626 m)   Wt 136 lb 3.2 oz (61.8 kg)   SpO2 99%   BMI 23.38 kg/m     Wt Readings from Last 3 Encounters:  01/08/23 136 lb 3.2 oz (61.8 kg)  11/01/22 136 lb 3.2 oz (61.8 kg)  10/09/22 134 lb (60.8 kg)      General: Alert, oriented x3, no distress, very lean and petite Head: no evidence of trauma, PERRL, EOMI, no exophtalmos or lid lag, no myxedema, no xanthelasma; normal ears, nose and oropharynx Neck: normal jugular venous pulsations and no hepatojugular reflux; brisk carotid pulses without delay and no carotid bruits Chest: clear to auscultation, no signs of consolidation by percussion or palpation, normal fremitus, symmetrical and full respiratory excursions Cardiovascular: normal position and quality of the apical impulse, regular rhythm, normal first and second heart sounds, no murmurs, rubs or gallops Abdomen: no tenderness or distention, no masses by palpation, no abnormal pulsatility or  arterial bruits, normal bowel sounds, no hepatosplenomegaly Extremities: no clubbing, cyanosis or edema; 2+ radial, ulnar and brachial pulses bilaterally; 2+ right femoral, posterior tibial and dorsalis pedis pulses; 2+ left femoral, posterior tibial and dorsalis pedis pulses; no subclavian or femoral bruits Neurological: grossly nonfocal Psych: Normal mood and affect    ASSESSMENT:    1. Coronary artery disease involving coronary bypass graft of native heart with other forms of angina pectoris ([place])   2. Mixed hyperlipidemia   3. Controlled type 1 diabetes mellitus with complication, with long-term current use of insulin (HCC)   4. Elevated blood pressure reading       PLAN:    In order of problems listed above:  CAD s/p CABG: Although she has a lot of areas of myocardium that are probably ischemic, I do not think the chest discomfort she is currently describing is angina.  It occurs at rest, not during physical activity.  After nitroglycerin menstruation release to 15-20 minutes.  The symptoms are mild.  She did not tolerate higher doses of beta-blocker and in the past has had intolerable headaches with long-acting nitrates.  Not withstanding the blood pressure recorded today, usually her blood pressure is low and would not allow calcium channel blockers.  If angina does become a more significant problem, consider Ranexa as the next step. HLP: All lipid parameters are in target range.  Even her triglycerides have now reached normal range without specific triglyceride lowering medications.  Continue Repatha. DM: Very good glycemic control.  Type 1 diabetes mellitus for 45 years. She is very insulin sensitive.  Followed by Dr. Cruzita Lederer in the endocrinology clinic. Elevated blood pressure seems to be situational and is decidedly unusual for her.  No change in medications.  Continue monitoring at home and make sure we check her home  blood pressure monitor against an office  standard.   Medication Adjustments/Labs and Tests Ordered: Current medicines are reviewed at length with the patient today.  Concerns regarding medicines are outlined above.  No orders of the defined types were placed in this encounter.  No orders of the defined types were placed in this encounter.   Patient Instructions  Medication Instructions:  No changes *If you need a refill on your cardiac medications before your next appointment, please call your pharmacy*  Follow-Up: At [facility], you and your health needs are our priority.  As part of our continuing mission to provide you with exceptional heart care, we have created designated Provider Care Teams.  These Care Teams include your primary Cardiologist (physician) and Advanced Practice Providers (APPs -  Physician Assistants and Nurse Practitioners) who all work together to provide you with the care you need, when you need it.  We recommend signing up for the patient portal called "MyChart".  Sign up information is provided on this After Visit Summary.  MyChart is used to connect with patients for Virtual Visits (Telemedicine).  Patients are able to view lab/test results, encounter notes, upcoming appointments, etc.  Non-urgent messages can be sent to your provider as well.   To learn more about what you can do with MyChart, go to [email or web address].    Your next appointment:   6 month(s)  Provider:   Sanda Klein, MD        Signed, Sanda Klein, MD  01/09/2023 8:41 AM    [place]

## 2023-01-09 ENCOUNTER — Encounter: Payer: Self-pay | Admitting: Cardiovascular Disease

## 2023-01-23 ENCOUNTER — Encounter: Payer: Self-pay | Admitting: Cardiovascular Disease

## 2023-01-23 DIAGNOSIS — R079 Chest pain, unspecified: Secondary | ICD-10-CM

## 2023-01-23 MED ORDER — RANOLAZINE ER 500 MG PO TB12: 500.0000 mg | ORAL_TABLET | Freq: Two times a day (BID) | ORAL | 0 refills | Status: DC

## 2023-01-23 MED ORDER — RANOLAZINE ER 1000 MG PO TB12: 1000.0000 mg | ORAL_TABLET | Freq: Two times a day (BID) | ORAL | 3 refills | Status: DC

## 2023-01-23 NOTE — Telephone Encounter (Signed)
Called the patient. (She has been trying to pay more attention to the chest pain/when it happens, etc...) She states that she had just the one episode this morning- felt like a sqeezing/pulling sensation in her chest. Took NTG, and After about 2-3 minutes it was completely better.  Then after a few hours she reports starting to feel some "soreness/tenderness" in her heart/chest area. She feels like maybe she is overthinking and making herself more anxious and making it worse?  Says it has been happening on and off over the past several days and she ended up taking a NTG this morning. This seems to happen with activity. 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  She said that this is how "it all started back in the day." Cabg w/5 bypasses 3 years ago- and this is how she felt then. But she was told she did not have a heart attack, she just had a lot of blockages.  She reports-No sweating, no referred pain, no shortness of breath, no swelling, no dizziness.  She said that Dr Sallyanne Kuster had mentioned a medication that can maybe help with these s/s?   Went over ER precautions and NTG precautions. Told her I would send this info to Dr Sallyanne Kuster for recommendations.

## 2023-01-23 NOTE — Telephone Encounter (Signed)
Please start Ranexa 500 mg twice daily for a week, then increase to 1000 mg twice daily. This medication has no impact on BP or heart rate. May cause some sensation of dizziness, independent of the HR and BP, which is why we start with the lower dose. Please schedule for a cardiac PET scan.

## 2023-01-23 NOTE — Telephone Encounter (Signed)
Please start Ranexa 500 mg twice daily for a week, then increase to 1000 mg twice daily. This medication has no impact on BP or heart rate. May cause some sensation of dizziness, independent of the HR and BP, which is why we start with the lower dose. Please schedule for a cardiac PET scan.     Called pt and gave the information above from Dr [employer]. Sent in prescriptions to preferred pharmacy. Put in order for Cardiac PET. Sent Staff message to [place].  Told pt that I will go over procedure instructions/preparation for PET on Monday 01/27/23.  Pt verbalized understanding.

## 2023-01-27 ENCOUNTER — Encounter: Payer: Self-pay | Admitting: Cardiovascular Disease

## 2023-02-10 ENCOUNTER — Encounter: Payer: Self-pay | Admitting: Cardiovascular Disease

## 2023-02-14 ENCOUNTER — Telehealth (HOSPITAL_COMMUNITY): Payer: Self-pay | Admitting: Emergency Medicine

## 2023-02-14 NOTE — Telephone Encounter (Signed)
Pt states dexcom is no longer offering replacement monitor/sensors. Wishes to postpone scan until a date when she does not need to replace the sensor prematurely since they are expensive.  New appt date 03/05/23.  Rockwell Alexandria RN Navigator Cardiac Imaging [facility] Heart and Vascular Services [phone] Office  [phone] Cell

## 2023-02-18 ENCOUNTER — Other Ambulatory Visit (HOSPITAL_COMMUNITY): Payer: Managed Care, Other (non HMO)

## 2023-02-21 ENCOUNTER — Telehealth: Payer: Self-pay | Admitting: Pharmacy Technician

## 2023-02-21 ENCOUNTER — Other Ambulatory Visit (HOSPITAL_COMMUNITY): Payer: Self-pay

## 2023-02-21 NOTE — Telephone Encounter (Signed)
Pharmacy Patient Advocate Encounter   Received notification from Pt advice request msgs that prior authorization for Dexcom G7 is required/requested.   PA submitted on 02/21/23 to (ins) [employer] via CoverMyMeds Key or [facility]) confirmation # [id number] Status is pending  Also received PA request in CoverMyMeds for omnipod, but the pt is no longer using it & per test claim no PA was needed.

## 2023-02-21 NOTE — Telephone Encounter (Signed)
Pharmacy Patient Advocate Encounter  Prior Authorization for [facility] G7 Sensor has been approved by [employer] (ins).    PA # [id number] Effective dates: 02/21/23 through 02/21/24

## 2023-02-21 NOTE — Telephone Encounter (Signed)
PA request submitted. New Encounter created for follow up. For additional info see Prior Auth telephone encounter from 02/21/23.

## 2023-02-24 ENCOUNTER — Encounter: Payer: Self-pay | Admitting: Cardiovascular Disease

## 2023-02-24 NOTE — Telephone Encounter (Signed)
I still think the PET would offer [place] useful information.  We do not have to act upon it if she is no longer having symptoms, but it would offer [place] a roadmap for the future.

## 2023-02-24 NOTE — Telephone Encounter (Signed)
Okay, sounds good.  Please cancel the PET scan.

## 2023-03-04 ENCOUNTER — Telehealth (HOSPITAL_COMMUNITY): Payer: Self-pay | Admitting: *Deleted

## 2023-03-04 NOTE — Telephone Encounter (Signed)
Returning patient's call about canceling her cardiac PET appointment.  Patient states she is having a reaction to a recent shingle vaccination and does not want to come in. I informed her that her Berkley Harvey was denied and we would seek an appeal.  She is aware to follow up with Dr. Erin Hearing office for an update.  Larey Brick RN Navigator Cardiac Imaging [facility] Heart and Vascular Services [phone] Office [phone] Cell

## 2023-03-05 ENCOUNTER — Encounter (HOSPITAL_COMMUNITY): Payer: Self-pay

## 2023-03-05 ENCOUNTER — Encounter (HOSPITAL_COMMUNITY): Admission: RE | Admit: 2023-03-05 | Payer: Managed Care, Other (non HMO) | Source: Ambulatory Visit

## 2023-03-06 ENCOUNTER — Ambulatory Visit: Payer: Managed Care, Other (non HMO) | Admitting: Internal Medicine

## 2023-03-23 ENCOUNTER — Other Ambulatory Visit: Payer: Self-pay | Admitting: Internal Medicine

## 2023-04-07 ENCOUNTER — Telehealth: Payer: Self-pay | Admitting: Cardiovascular Disease

## 2023-04-07 ENCOUNTER — Other Ambulatory Visit (HOSPITAL_COMMUNITY): Payer: Self-pay

## 2023-04-07 ENCOUNTER — Encounter: Payer: Self-pay | Admitting: Pharmacist

## 2023-04-07 ENCOUNTER — Telehealth: Payer: Self-pay

## 2023-04-07 DIAGNOSIS — I25708 Atherosclerosis of coronary artery bypass graft(s), unspecified, with other forms of angina pectoris: Secondary | ICD-10-CM

## 2023-04-07 DIAGNOSIS — I214 Non-ST elevation (NSTEMI) myocardial infarction: Secondary | ICD-10-CM

## 2023-04-07 DIAGNOSIS — E1069 Type 1 diabetes mellitus with other specified complication: Secondary | ICD-10-CM

## 2023-04-07 NOTE — Telephone Encounter (Signed)
Please run PA for Repatha under new insurance [employer]. Patient ask to lease keep her updated on PA status.

## 2023-04-07 NOTE — Telephone Encounter (Signed)
Pt c/o medication issue:  1. Name of Medication:    REPATHA SURECLICK 140 MG/ML SOAJ    2. How are you currently taking this medication (dosage and times per day)? As written  3. Are you having a reaction (difficulty breathing--STAT)? No   4. What is your medication issue? Pt called in stating she needs a new prior auth for this med since her insurance has changed. Please advise.

## 2023-04-10 ENCOUNTER — Other Ambulatory Visit: Payer: Self-pay | Admitting: Internal Medicine

## 2023-04-10 ENCOUNTER — Encounter: Payer: Self-pay | Admitting: Cardiovascular Disease

## 2023-04-10 DIAGNOSIS — E103293 Type 1 diabetes mellitus with mild nonproliferative diabetic retinopathy without macular edema, bilateral: Secondary | ICD-10-CM

## 2023-04-14 ENCOUNTER — Other Ambulatory Visit: Payer: Self-pay | Admitting: Internal Medicine

## 2023-04-14 NOTE — Telephone Encounter (Signed)
Was on the phone for over 30 minutes with [employer] rep and the patient answering questions for PA for Repatha, due to pt recently changing insurance companies.  Also faxed several documents to [employer] work, OV notes from Dr [employer] and other [facility] team members, and  Heart Cath results  [phone] Case ID# [id number]

## 2023-04-16 MED ORDER — REPATHA SURECLICK 140 MG/ML ~~LOC~~ SOAJ: 140.0000 mg | SUBCUTANEOUS | 3 refills | Status: DC

## 2023-04-16 NOTE — Telephone Encounter (Signed)
Called to let the patient know that her Repatha has been approved. She was thankful and verbalized understanding.

## 2023-04-16 NOTE — Telephone Encounter (Signed)
Received via FAX QUE  (REPATHA APPROVED)  04/14/23 THROUGH 04/14/24

## 2023-05-02 ENCOUNTER — Encounter: Payer: Self-pay | Admitting: Internal Medicine

## 2023-05-02 ENCOUNTER — Ambulatory Visit: Payer: Managed Care, Other (non HMO) | Admitting: Internal Medicine

## 2023-05-02 VITALS — BP 120/68 | HR 58 | Ht 64.0 in

## 2023-05-02 DIAGNOSIS — Z794 Long term (current) use of insulin: Secondary | ICD-10-CM | POA: Diagnosis not present

## 2023-05-02 DIAGNOSIS — E1069 Type 1 diabetes mellitus with other specified complication: Secondary | ICD-10-CM | POA: Diagnosis not present

## 2023-05-02 DIAGNOSIS — E103293 Type 1 diabetes mellitus with mild nonproliferative diabetic retinopathy without macular edema, bilateral: Secondary | ICD-10-CM

## 2023-05-02 DIAGNOSIS — E119 Type 2 diabetes mellitus without complications: Secondary | ICD-10-CM

## 2023-05-02 DIAGNOSIS — E785 Hyperlipidemia, unspecified: Secondary | ICD-10-CM | POA: Diagnosis not present

## 2023-05-02 LAB — HEMOGLOBIN A1C: Hemoglobin A1C: 6.3

## 2023-05-02 NOTE — Patient Instructions (Addendum)
Please use the following regimen: - NPH insulin 8 >> 9 (may increase to 10 units if needed) in the evening - R insulin: B'fast: 5:1 (if going for a walk: 10:1) Lunch: 15:1 Dinner: 25:1 - target 150 - insulin sensitivity factor (ISF) 60: 150-200: + 1 unit 201-260: + 2 units 261-320: + 3 units 321-380: + 4 units >380: + 5 units Do not correct bedtime sugars <300, and only inject 1 unit then.  For the snack at night, do not take more than 1-2 units of regular insulin!   Please return in 4 months.

## 2023-05-02 NOTE — Progress Notes (Signed)
Patient ID: Felicia [name], female   DOB: [birth year], 62 y.o.   MRN: [id number]  HPI: Felicia [name] is a 62 y.o.-year-old female, presenting for follow-up for DM1, dx in 76 (age 62), uncontrolled, with complications (CAD - s/p 5v CABG, mild retinopathy, gastroparesis). Last visit 6 months ago.  Interim hx: No increased urination, blurry vision, nausea, chest pain.  Insulin pump: -  >> stopped 02/2022  CGM: - Dexcom G6 >> G7  Reviewed HbA1c levels: Lab Results  Component Value Date   HGBA1C 6.6 (A) 11/01/2022   HGBA1C 7.1 (A) 06/28/2022   HGBA1C 7.3 (A) 03/18/2022   HGBA1C 6.9 (A) 11/30/2021   HGBA1C 7.6 (H) 10/05/2021   HGBA1C 7.9 (H) 06/11/2021   HGBA1C 7.5 (A) 11/30/2020   HGBA1C 7.0 (A) 07/28/2020   HGBA1C 8.9 (H) 02/17/2020   HGBA1C 8.5 (H) 09/23/2019   HGBA1C 9.0 (H) 06/17/2019   HGBA1C 8.0 (A) 09/04/2018   HGBA1C 7.7 (A) 04/17/2018   HGBA1C 7.5 12/16/2017   HGBA1C 7.8 08/14/2017   HGBA1C 7.5 04/08/2017   HGBA1C 7.5 10/29/2016   HGBA1C 7.1 07/30/2016   HGBA1C 7.9 05/06/2016   HGBA1C 7.3 12/28/2015   HGBA1C 7.6 09/27/2015   HGBA1C 7.7 (H) 06/07/2015   HGBA1C 7.5 (H) 03/23/2015   HGBA1C 8.2 (H) 11/15/2014   She is on: - NPH 4  units at night (1-2 am) -added 03/2022 >> 6 >> 7 units at 9-10 pm >> 8 units at night - R insulin - at the start of the meal or correction- 4-8 units - insulin to carb ratio (ICR)  B'fast: 5:1 (if going for a walk: 10:1) Lunch: 15:1 Dinner: 25:1 - target 150 - insulin sensitivity factor (ISF) 60: 150-200: + 1 unit 201-260: + 2 units 261-320: + 3 units 321-380: + 4 units >380: + 5 units Do not correct bedtime sugars <300, and only inject 1 unit then.  For the snack at night, do not take more than 1-2 units of regular insulin!  Tried Metformin ER 1000 mg with dinner but stopped due to lack of effect. Tried [place] >> she did not feel that this worked as well as Lantus. Tried Toujeo >> allergy: CP, SOB. She was previously on  insulin pump for 5 years (~2010) but she did not like it.  Sugars were not better on this.    She was on an OmniPod 5 insulin pump but felt her sugars were higher >> stopped 02/2022.  Meter: ReliOn  She checks her blood sugars more than 4 times a day with her Dexcom CGM:    Previously:   Previously:   Lowest sugar was 38 >>...45 (CGM but 62 by glucometer) >> 52 >> 50s; + hypoglycemia awareness in the 70s.  No recent hyper or hypoglycemia admissions.  She has a glucagon kit at home. Highest sugar was 500s (bagel)  ... >> 345 >> 300s >> 400s (?).   Pt's meals are: - Breakfast: protein bar + fruit >> granola bar (25 g carbs) - 10 am snack  - Lunch: apple + cheese, carrots - Snack bar: 15g carbs - Glucerna - Dinner: meat + 1/2 backed potato + veggies/salad - Snacks: popcorn, salty snacks: Potato chips  -No CKD, last BUN/creatinine:  Lab Results  Component Value Date   BUN 13 08/13/2022   CREATININE 1.16 08/13/2022  Previously on quinapril, now off.  + HL; last set of lipids: Lab Results  Component Value Date   CHOL 138 08/13/2022   HDL 49.40  08/13/2022   LDLCALC 66 08/13/2022   LDLDIRECT 120.0 06/17/2019   TRIG 116.0 08/13/2022   CHOLHDL 3 08/13/2022  She tried pravastatin, lovastatin, Zetia, even adding co-Q10 but she still had muscle cramps >>  now on Repatha, tolerated well. She also uses uses 2 creams: Theraworx + Muscle calm.    - last eye exam was on 09/10/2022: + DR  - no numbness and tingling in her feet. Last foot exam: 09/25/2022.  On ASA 81.  Latest TSH is normal: Lab Results  Component Value Date   TSH 2.85 06/11/2021   She also has a history of HTN. She has a history of recurrent vaginal candidiasis.  ROS: + See HPI  I reviewed pt's medications, allergies, PMH, social hx, family hx, and changes were documented in the history of present illness. Otherwise, unchanged from my initial visit note.  Past Medical History:  Diagnosis Date   Allergy     Anemia    past history long ago   Borderline HTN (hypertension)    CAD (coronary artery disease)    a. 02/2020 Cath: LM nl, LAD large, 90p, 33m, D1 90p, LCX nl, OM 60-70p, RCA dominant w/ dampening upon engagement of ostium. 70p/m, RPDA 50. EF 55-65%.   COVID-19 virus infection 02/2021   Diabetes (HCC)    Disorder of kidney    has abd kidney (right)   GERD (gastroesophageal reflux disease)    History of diabetic gastroparesis    History of echocardiogram    a. 01/2013 Echo: EF 55-65%, no rwma. Mild MR. Nl RV fxn.   HLD (hyperlipidemia)    Hx: UTI (urinary tract infection)    IBS (irritable bowel syndrome)    Nephrolithiasis 2005   Osteopenia 2012   per pt   Reflux esophagitis    Systolic murmur 01/2013   mild mitral insuff   Type 1 diabetes mellitus with diabetic retinopathy without macular edema (HCC)    dx at 62 y/o, background retinopathy, background retinopathy   Past Surgical History:  Procedure Laterality Date   APPENDECTOMY     CESAREAN SECTION  1985   COLONOSCOPY  07/2016   WNL (Pyrtle)   CORONARY ARTERY BYPASS GRAFT N/A 02/21/2020   Procedure: CORONARY ARTERY BYPASS GRAFTING (CABG) times five using left internal mammary and right leg saphenous vein;  Surgeon: Corliss Skains, MD;  Location: [facility] OR;  Service: Open Heart Surgery;  Laterality: N/A;   ESOPHAGOGASTRODUODENOSCOPY  07/2016   LA Grade C reflux esophagitis, concern for stasis/dysmotility (Pyrtle)   LAPAROSCOPIC APPENDECTOMY N/A 09/01/2015   Procedure: APPENDECTOMY LAPAROSCOPIC;  Surgeon: Jimmye Norman, MD;  Location: [facility] OR;  Service: General;  Laterality: N/A;   LEFT HEART CATH AND CORONARY ANGIOGRAPHY N/A 02/17/2020   Procedure: LEFT HEART CATH AND CORONARY ANGIOGRAPHY;  Surgeon: Antonieta Iba, MD;  Location: [facility] INVASIVE CV LAB;  Service: Cardiovascular;  Laterality: N/A;   LEFT HEART CATH AND CORS/GRAFTS ANGIOGRAPHY N/A 02/29/2020   early failure of 4/5 bypass grafts - medically managed Clifton James, Nile Dear, MD)    ORIF FEMUR FRACTURE Left 1996   Corrected by patient in history 2016   PARTIAL HYSTERECTOMY  2000   paps by [facility]   TEE WITHOUT CARDIOVERSION N/A 02/21/2020   Procedure: TRANSESOPHAGEAL ECHOCARDIOGRAM (TEE);  Surgeon: Corliss Skains, MD;  Location: [facility] OR;  Service: Open Heart Surgery;  Laterality: N/A;   TONSILLECTOMY AND ADENOIDECTOMY  1975   TRIGGER FINGER RELEASE Left 12/2010   thumb   Social History  Socioeconomic History   Marital status: Married    Spouse name: Not on file   Number of children: 1   Years of education: 16   Highest education level: Bachelor's degree (e.g., BA, AB, BS)  Occupational History   Occupation: HR Chemical engineer: OTHER    Comment: Scientist, research (medical)  Tobacco Use   Smoking status: Never    Passive exposure: Never   Smokeless tobacco: Never  Vaping Use   Vaping Use: Never used  Substance and Sexual Activity   Alcohol use: No    Alcohol/week: 0.0 standard drinks of alcohol   Drug use: No   Sexual activity: Yes    Birth control/protection: None    Comment: Married  Other Topics Concern   Not on file  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  Has one daughter.  Education: college.      1 dog   Social Determinants of Corporate investment banker Strain: Not on file  Food Insecurity: Not on file  Transportation Needs: Not on file  Physical Activity: Not on file  Stress: Not on file  Social Connections: Not on file  Intimate Partner Violence: Not on file   Current Outpatient Medications on File Prior to Visit  Medication Sig Dispense Refill   aspirin 81 MG EC tablet Take 1 tablet (81 mg total) by mouth daily.     Continuous Glucose Sensor (DEXCOM G7 SENSOR) MISC CHANGE SENSOR EVERY 10 DAYS 9 each 1   esomeprazole (NEXIUM) 40 MG capsule Take 1 capsule (40 mg total) by mouth daily. Patient needs office visit for further refills 90  capsule 0   Evolocumab (REPATHA SURECLICK) 140 MG/ML SOAJ Inject 140 mg into the skin every 14 (fourteen) days. 6 mL 3   Glucagon 3 MG/DOSE POWD Place 3 mg into the nose once as needed for up to 1 dose. 1 each 11   insulin NPH Human (HUMULIN N) 100 UNIT/ML injection Inject 0.08 mLs (8 Units total) into the skin at bedtime. 10 mL 5   insulin regular (NOVOLIN R) 100 units/mL injection INJECT 6 UNITS TOTAL INTO THE SKIN 3 (THREE) TIMES DAILY BEFORE MEALS. 30 mL 0   metoprolol succinate (TOPROL-XL) 25 MG 24 hr tablet TAKE 1 TABLET (25 MG TOTAL) BY MOUTH DAILY. 90 tablet 1   mirabegron ER (MYRBETRIQ) 25 MG TB24 tablet Take 1 tablet (25 mg total) by mouth daily.     Multiple Vitamin (MULTIVITAMIN) tablet Take 1 tablet by mouth daily.     nitroGLYCERIN (NITROSTAT) 0.4 MG SL tablet Place 1 tablet (0.4 mg total) under the tongue every 5 (five) minutes as needed for chest pain. 25 tablet 3   promethazine-dextromethorphan (PROMETHAZINE-DM) 6.25-15 MG/5ML syrup Take 5 mLs by mouth 4 (four) times daily as needed for cough. 118 mL 0   ranolazine (RANEXA) 1000 MG SR tablet Take 1 tablet (1,000 mg total) by mouth 2 (two) times daily. 180 tablet 3   ranolazine (RANEXA) 500 MG 12 hr tablet Take 1 tablet (500 mg total) by mouth 2 (two) times daily for 7 days. Increase to 1000mg  twice a day after 7 days 14 tablet 0   TECHLITE INSULIN SYRINGE 31G X 5/16" 0.3 ML MISC USE UP TO 5 TIMES DAILY TO INJECT INSULIN DAILY 300 each 1   No current facility-administered medications on file prior to visit.   Allergies  Allergen Reactions   Protonix [Pantoprazole] Swelling    Facial Swelling   Toujeo Solostar [Insulin Glargine] Shortness Of Breath   Lactose Intolerance (Gi)     GI   Magnesium-Containing Compounds Swelling and Other (See Comments)    limbs   Adhesive [Tape] Rash and Other (See Comments)    Band aids   Family History  Problem Relation Age of Onset   Diabetes Maternal Grandmother    Heart failure Maternal  Grandmother    COPD Maternal Grandmother        smoker   Thyroid disease Maternal Grandmother    Vascular Disease Maternal Grandmother    Colon cancer Maternal Grandfather    Colon polyps Mother    Breast cancer Other    CAD Brother 46       stent   Esophageal cancer Neg Hx    Rectal cancer Neg Hx    Stomach cancer Neg Hx    PE: BP 120/68   Pulse (!) 58   Ht 5\' 4"  (1.626 m)   SpO2 99%   BMI 23.38 kg/m    Wt Readings from Last 3 Encounters:  01/08/23 136 lb 3.2 oz (61.8 kg)  11/01/22 136 lb 3.2 oz (61.8 kg)  10/09/22 134 lb (60.8 kg)   Constitutional: normal weight, in NAD Eyes: no exophthalmos ENT: no masses palpated in neck, no cervical lymphadenopathy Cardiovascular: RRR, No MRG Respiratory: CTA B Musculoskeletal: no deformities Skin: no rashes Neurological: no tremor with outstretched hands  ASSESSMENT: 1. DM1, uncontrolled, with complications - CAD - s/p 5v CABG - 02/21/2020 - Mild DR - Gastroparesis - This is the reason why she is on regular insulin, and she is taking it 10 minutes  rather than 30 minutes before eating  She had low CBGs with Novolog!  2. HL  PLAN:  1.  Complex patient with difficult to control type 1 diabetes, on basal/bolus insulin regimen.  She was previously on insulin pump but she felt that her sugars were higher on the OmniPod 5 device and stopped 02/2022.  Previously, she had another insulin pump more than 10 years prior but she did not feel that the sugars were better on this. -Her sugars are usually very high in the second half of the night and much better controlled during the day.  Unfortunately, we were not able to find a good regimen for her to counteract the dawn phenomenon. -I did previously recommend [place] but she did not start.  We did not use a GLP-1 receptor agonist for her due to history of gastroparesis. -At last visit, she had some of the blood sugars I have seen for her.  She finally accepted to increase the dose of NPH  from 6 to 7 units and sugars appears to be less variable with this change.  She still have to measure regular insulin at different times of the day and night to bring the sugars down and she had subsequent occasional hypoglycemia around lunchtime.  We discussed about trying to increase the NPH to go more, to 8 units and possibly 9 to see if she can use less regular insulin correction.  We discussed how to change the doses of her insulin in case of using prednisone. -At last visit, HbA1c was lower, at 6.6%. -CGM interpretation: -At today's visit, we reviewed her CGM downloads: It appears that 81% of values are in target range (goal >70%), while 18% are higher than 180 (goal <25%), and 1% are lower than 70 (goal <4%).  The calculated average blood sugar is 145.  The projected HbA1c for the next 3 months (GMI) is 6.8%. -Reviewing the CGM trends, she continues to have dawn phenomenon, but her sugars continue to improve after she finally started to increase her NPH dose at night.  We have gradually increased the dose that she takes at approximately 9:30 PM to 8 units and her sugars are now fluctuating mostly within the target range with only occasional higher values around 4 AM.  She still has to correct high blood sugars repeatedly with regular insulin, and she noticed that the sugars take a longer time to come down and they finally improve around 11 AM, but she does feel that she does not have to fight with her blood sugars as much as before.  At today's visit, we discussed about the increasing NPH further, to 9 and even 10 units if needed while keeping the rest of her regimen the same.  We also discussed about possible reasons for blood sugar fluctuation during the day, including variable absorption rates, dehydration, stress, etc. -I advised her to Patient Instructions  Please use the following regimen: - NPH insulin 8 >> 9 (may increase to 10 units if needed) in the evening - R insulin: B'fast: 5:1 (if  going for a walk: 10:1) Lunch: 15:1 Dinner: 25:1 - target 150 - insulin sensitivity factor (ISF) 60: 150-200: + 1 unit 201-260: + 2 units 261-320: + 3 units 321-380: + 4 units >380: + 5 units Do not correct bedtime sugars <300, and only inject 1 unit then.  For the snack at night, do not take more than 1-2 units of regular insulin!   Please return in 4 months.  - we checked her HbA1c: 6.3% (even lower) - advised to check sugars at different times of the day - 4x a day, rotating check times - advised for yearly eye exams >> she is UTD - return to clinic in 4 months     2. HL -Reviewed latest lipid panel from 08/2022: LDL at goal, as are the rest of her lipid fractions: Lab Results  Component Value Date   CHOL 138 08/13/2022   HDL 49.40 08/13/2022   LDLCALC 66 08/13/2022   LDLDIRECT 120.0 06/17/2019   TRIG 116.0 08/13/2022   CHOLHDL 3 08/13/2022  -She is on Repatha and fish oil without side effects  Carlus Pavlov, MD PhD [facility]

## 2023-05-22 ENCOUNTER — Telehealth: Payer: Self-pay | Admitting: *Deleted

## 2023-05-22 NOTE — Telephone Encounter (Signed)
   Name: Felicia [name]  DOB: [birth year]  MRN: [id number]  Primary Cardiologist: Thurmon Fair, MD   Preoperative team, please contact this patient and set up a phone call appointment for further preoperative risk assessment. Please obtain consent and complete medication review. Thank you for your help.  I confirm that guidance regarding antiplatelet and oral anticoagulation therapy has been completed and, if necessary, noted below.  Per office protocol, if patient is without any new symptoms or concerns at the time of their virtual visit, she may hold Aspirin for 5-7 days prior to procedure. Please resume Aspirin as soon as possible postprocedure, at the discretion of the surgeon.    Joylene Grapes, NP 05/22/2023, 4:40 PM [facility]

## 2023-05-22 NOTE — Telephone Encounter (Signed)
   Pre-operative Risk Assessment    Patient Name: Felicia [name]  DOB: August 26, 1961 MRN: [id number]       Request for Surgical Clearance    Procedure:   ARTHROSCOPUY AND JOINT DEBRIDEMENT  2. When is this surgery scheduled? Press F2 to enter date below and place date in Reason for Visit (see directions below). :1} Date of Surgery:  Clearance 05/28/23                                 Surgeon: DR Bradly Bienenstock Surgeon's Group or Practice Name:  [facility]  Phone number:  [phone] Fax number:  [phone] Felicia [name]    Type of Clearance Requested:   - Medical  - Pharmacy:  Hold Aspirin     Type of Anesthesia:  General    Additional requests/questions:   N/A   SignedOleta [name]   05/22/2023, 4:05 PM

## 2023-05-23 ENCOUNTER — Telehealth: Payer: Self-pay | Admitting: *Deleted

## 2023-05-23 ENCOUNTER — Ambulatory Visit: Payer: Managed Care, Other (non HMO) | Attending: Nurse Practitioner | Admitting: Nurse Practitioner

## 2023-05-23 DIAGNOSIS — Z01810 Encounter for preprocedural cardiovascular examination: Secondary | ICD-10-CM

## 2023-05-23 NOTE — Telephone Encounter (Signed)
Pt agreeable to tele pre op appt 3:20 today ok per pre op APP due to med hold and proc date. Med rec and consent are done.      Patient Consent for Virtual Visit        Felicia [name] has provided verbal consent on 05/23/2023 for a virtual visit (video or telephone).   CONSENT FOR VIRTUAL VISIT FOR:  Felicia [name]  By participating in this virtual visit I agree to the following:  I hereby voluntarily request, consent and authorize [facility] and its employed or contracted physicians, physician assistants, nurse practitioners or other licensed health care professionals (the Practitioner), to provide me with telemedicine health care services (the "Services") as deemed necessary by the treating Practitioner. I acknowledge and consent to receive the Services by the Practitioner via telemedicine. I understand that the telemedicine visit will involve communicating with the Practitioner through live audiovisual communication technology and the disclosure of certain medical information by electronic transmission. I acknowledge that I have been given the opportunity to request an in-person assessment or other available alternative prior to the telemedicine visit and am voluntarily participating in the telemedicine visit.  I understand that I have the right to withhold or withdraw my consent to the use of telemedicine in the course of my care at any time, without affecting my right to future care or treatment, and that the Practitioner or I may terminate the telemedicine visit at any time. I understand that I have the right to inspect all information obtained and/or recorded in the course of the telemedicine visit and may receive copies of available information for a reasonable fee.  I understand that some of the potential risks of receiving the Services via telemedicine include:  Delay or interruption in medical evaluation due to technological equipment failure or disruption; Information  transmitted may not be sufficient (e.g. poor resolution of images) to allow for appropriate medical decision making by the Practitioner; and/or  In rare instances, security protocols could fail, causing a breach of personal health information.  Furthermore, I acknowledge that it is my responsibility to provide information about my medical history, conditions and care that is complete and accurate to the best of my ability. I acknowledge that Practitioner's advice, recommendations, and/or decision may be based on factors not within their control, such as incomplete or inaccurate data provided by me or distortions of diagnostic images or specimens that may result from electronic transmissions. I understand that the practice of medicine is not an exact science and that Practitioner makes no warranties or guarantees regarding treatment outcomes. I acknowledge that a copy of this consent can be made available to me via my patient portal [facility] MyChart), or I can request a printed copy by calling the office of [facility].    I understand that my insurance will be billed for this visit.   I have read or had this consent read to me. I understand the contents of this consent, which adequately explains the benefits and risks of the Services being provided via telemedicine.  I have been provided ample opportunity to ask questions regarding this consent and the Services and have had my questions answered to my satisfaction. I give my informed consent for the services to be provided through the use of telemedicine in my medical care

## 2023-05-23 NOTE — Telephone Encounter (Signed)
Pt agreeable to tele pre op appt 3:20 today ok per pre op APP due to med hold and proc date. Med rec and consent are done.

## 2023-05-23 NOTE — Progress Notes (Signed)
Virtual Visit via Telephone Note   Because of Felicia [name]'s co-morbid illnesses, she is at least at moderate risk for complications without adequate follow up.  This format is felt to be most appropriate for this patient at this time.  The patient did not have access to video technology/had technical difficulties with video requiring transitioning to audio format only (telephone).  All issues noted in this document were discussed and addressed.  No physical exam could be performed with this format.  Please refer to the patient's chart for her consent to telehealth for [facility].  Evaluation Performed:  Preoperative cardiovascular risk assessment _____________   Date:  05/23/2023   Patient ID:  Felicia [name], DOB 08/23/1961, MRN [id number] Patient Location:  Home Provider location:   Office  Primary Care Provider:  [name], MD Primary Cardiologist:  Thurmon Fair, MD  Chief Complaint / Patient Profile   62 y.o. y/o female with a h/o CAD s/p CABG, hypertension, hyperlipidemia, and type 1 diabetes who is pending  ARTHROSCOPY AND JOINT DEBRIDEMENT of L wrist on 05/28/2023 with Dr. Bradly Bienenstock of [facility] and presents today for telephonic preoperative cardiovascular risk assessment.  History of Present Illness    Felicia [name] is a 62 y.o. female who presents via audio/video conferencing for a telehealth visit today.  Pt was last seen in cardiology clinic on 01/08/2023 by Dr. Royann Shivers.  At that time JEWEL MCAFEE was doing well.  The patient is now pending procedure as outlined above. Since her last visit, she has done well from a cardiac standpoint.   She denies chest pain, palpitations, dyspnea, pnd, orthopnea, n, v, dizziness, syncope, edema, weight gain, or early satiety. All other systems reviewed and are otherwise negative except as noted above.   Past Medical History    Past Medical History:  Diagnosis Date   Allergy    Anemia    past history long  ago   Borderline HTN (hypertension)    CAD (coronary artery disease)    a. 02/2020 Cath: LM nl, LAD large, 90p, 4m, D1 90p, LCX nl, OM 60-70p, RCA dominant w/ dampening upon engagement of ostium. 70p/m, RPDA 50. EF 55-65%.   COVID-19 virus infection 02/2021   Diabetes (HCC)    Disorder of kidney    has abd kidney (right)   GERD (gastroesophageal reflux disease)    History of diabetic gastroparesis    History of echocardiogram    a. 01/2013 Echo: EF 55-65%, no rwma. Mild MR. Nl RV fxn.   HLD (hyperlipidemia)    Hx: UTI (urinary tract infection)    IBS (irritable bowel syndrome)    Nephrolithiasis 2005   Osteopenia 2012   per pt   Reflux esophagitis    Systolic murmur 01/2013   mild mitral insuff   Type 1 diabetes mellitus with diabetic retinopathy without macular edema (HCC)    dx at 62 y/o, background retinopathy   Past Surgical History:  Procedure Laterality Date   APPENDECTOMY     CESAREAN SECTION  1985   COLONOSCOPY  07/2016   WNL (Pyrtle)   CORONARY ARTERY BYPASS GRAFT N/A 02/21/2020   Procedure: CORONARY ARTERY BYPASS GRAFTING (CABG) times five using left internal mammary and right leg saphenous vein;  Surgeon: Corliss Skains, MD;  Location: [facility] OR;  Service: Open Heart Surgery;  Laterality: N/A;   ESOPHAGOGASTRODUODENOSCOPY  07/2016   LA Grade C reflux esophagitis, concern for stasis/dysmotility (Pyrtle)   LAPAROSCOPIC APPENDECTOMY N/A 09/01/2015  Procedure: APPENDECTOMY LAPAROSCOPIC;  Surgeon: Jimmye Norman, MD;  Location: [facility] OR;  Service: General;  Laterality: N/A;   LEFT HEART CATH AND CORONARY ANGIOGRAPHY N/A 02/17/2020   Procedure: LEFT HEART CATH AND CORONARY ANGIOGRAPHY;  Surgeon: Antonieta Iba, MD;  Location: [facility] INVASIVE CV LAB;  Service: Cardiovascular;  Laterality: N/A;   LEFT HEART CATH AND CORS/GRAFTS ANGIOGRAPHY N/A 02/29/2020   early failure of 4/5 bypass grafts - medically managed Clifton James, Nile Dear, MD)   ORIF FEMUR FRACTURE Left 1996    Corrected by patient in history 2016   PARTIAL HYSTERECTOMY  2000   paps by [facility] [place]   TEE WITHOUT CARDIOVERSION N/A 02/21/2020   Procedure: TRANSESOPHAGEAL ECHOCARDIOGRAM (TEE);  Surgeon: Corliss Skains, MD;  Location: [facility] OR;  Service: Open Heart Surgery;  Laterality: N/A;   TONSILLECTOMY AND ADENOIDECTOMY  1975   TRIGGER FINGER RELEASE Left 12/2010   thumb    Allergies  Allergies  Allergen Reactions   Protonix [Pantoprazole] Swelling    Facial Swelling   Toujeo Solostar [Insulin Glargine] Shortness Of Breath   Lactose Intolerance (Gi)     GI   Magnesium-Containing Compounds Swelling and Other (See Comments)    limbs   Adhesive [Tape] Rash and Other (See Comments)    Band aids    Home Medications    Prior to Admission medications   Medication Sig Start Date End Date Taking? Authorizing Provider  aspirin 81 MG EC tablet Take 1 tablet (81 mg total) by mouth daily. 05/19/20   Croitoru, Rachelle Hora, MD  Continuous Glucose Sensor (DEXCOM G7 SENSOR) MISC CHANGE SENSOR EVERY 10 DAYS 03/23/23   Carlus Pavlov, MD  esomeprazole (NEXIUM) 40 MG capsule Take 1 capsule (40 mg total) by mouth daily. Patient needs office visit for further refills 04/26/19   Pyrtle, Carie Caddy, MD  Evolocumab (REPATHA SURECLICK) 140 MG/ML SOAJ Inject 140 mg into the skin every 14 (fourteen) days. 04/16/23   Croitoru, Mihai, MD  Glucagon 3 MG/DOSE POWD Place 3 mg into the nose once as needed for up to 1 dose. 06/28/22   Carlus Pavlov, MD  insulin NPH Human (HUMULIN N) 100 UNIT/ML injection Inject 0.08 mLs (8 Units total) into the skin at bedtime. 12/30/22   Carlus Pavlov, MD  insulin regular (NOVOLIN R) 100 units/mL injection INJECT 6 UNITS TOTAL INTO THE SKIN 3 (THREE) TIMES DAILY BEFORE MEALS. 04/10/23   Carlus Pavlov, MD  metoprolol succinate (TOPROL-XL) 25 MG 24 hr tablet TAKE 1 TABLET (25 MG TOTAL) BY MOUTH DAILY. 12/24/22   O'NealRonnald Ramp, MD  mirabegron ER (MYRBETRIQ) 25 MG TB24  tablet Take 1 tablet (25 mg total) by mouth daily. 08/20/22   [name], MD  Multiple Vitamin (MULTIVITAMIN) tablet Take 1 tablet by mouth daily.    [provider]  nitroGLYCERIN (NITROSTAT) 0.4 MG SL tablet Place 1 tablet (0.4 mg total) under the tongue every 5 (five) minutes as needed for chest pain. 02/01/21 01/08/23  Croitoru, Rachelle Hora, MD  TECHLITE INSULIN SYRINGE 31G X 5/16" 0.3 ML MISC USE UP TO 5 TIMES DAILY TO INJECT INSULIN DAILY 04/14/23   Carlus Pavlov, MD    Physical Exam    Vital Signs:  Elliot Dally does not have vital signs available for review today.  Given telephonic nature of communication, physical exam is limited. AAOx3. NAD. Normal affect.  Speech and respirations are unlabored.  Accessory Clinical Findings    None  Assessment & Plan    1.  Preoperative Cardiovascular  Risk Assessment:  According to the Revised Cardiac Risk Index (RCRI), her Perioperative Risk of Major Cardiac Event is (%): 0.9. Her Functional Capacity in METs is: 9.89 according to the [employer] Activity Status Index (DASI). Therefore, based on ACC/AHA guidelines, patient would be at acceptable risk for the planned procedure without further cardiovascular testing.  The patient was advised that if she develops new symptoms prior to surgery to contact our office to arrange for a follow-up visit, and she verbalized understanding.  Per office protocol,she may hold Aspirin for 5-7 days prior to procedure. Please resume Aspirin as soon as possible postprocedure, at the discretion of the surgeon.   A copy of this note will be routed to requesting surgeon.  Time:   Today, I have spent 5 minutes with the patient with telehealth technology discussing medical history, symptoms, and management plan.     Joylene Grapes, NP  05/23/2023, 3:26 PM

## 2023-05-29 ENCOUNTER — Encounter: Payer: Self-pay | Admitting: Cardiovascular Disease

## 2023-06-09 ENCOUNTER — Encounter: Payer: Self-pay | Admitting: Cardiovascular Disease

## 2023-06-09 DIAGNOSIS — R079 Chest pain, unspecified: Secondary | ICD-10-CM

## 2023-06-09 MED ORDER — NITROGLYCERIN 0.4 MG SL SUBL
0.4000 mg | SUBLINGUAL_TABLET | SUBLINGUAL | 3 refills | Status: AC | PRN
Start: 2023-06-09 — End: ?

## 2023-06-09 NOTE — Telephone Encounter (Signed)
Yes, please refill

## 2023-06-12 ENCOUNTER — Other Ambulatory Visit: Payer: Self-pay | Admitting: Internal Medicine

## 2023-06-12 DIAGNOSIS — E103293 Type 1 diabetes mellitus with mild nonproliferative diabetic retinopathy without macular edema, bilateral: Secondary | ICD-10-CM

## 2023-07-03 ENCOUNTER — Ambulatory Visit: Payer: Managed Care, Other (non HMO) | Admitting: Internal Medicine

## 2023-07-10 NOTE — Progress Notes (Signed)
Cardiology Office Note:    Date:  07/12/2023   ID:  Felicia [name], DOB November 22, 1960, MRN [id number]  PCP:  [name], MD  [facility] Cardiologist:  Thurmon Fair, MD  [facility] Electrophysiologist:  None   Referring MD: [name], MD   Chief Complaint  Patient presents with   Coronary Artery Disease    History of Present Illness:    Felicia [name] is a 62 y.o. female with a hx of CAD s/p CABG April 2021, with early vein graft occlusion but patent LIMA to LAD, type 1 diabetes mellitus on insulin, hypercholesterolemia intolerant to statins on PCSK9 inhibitor.  She had several months of feeling better, but recently has developed chest discomfort again.  This was present at work on 06/24/2023 and she had to go home early.  It was present again the next day, but resolved spontaneously.  She did not try to take nitroglycerin.  She has not had any dizzy spells or syncope.  She does not have palpitations and denies shortness of breath at rest or with activity.  No edema or claudication.  She has not tolerated long-acting nitrates due to migraines.  She is on a small dose of medical succinate 25 mg daily as her only antianginal.  Her blood pressure is again surprisingly high today: Initially 190/92, when I rechecked it 170/80.  In June her blood pressure was 120/68.  She has not been checking it recently at home.  In the past she often had problems with hypotension, especially in the first few months after her bypass surgery.  Metabolic control remains good.  Her most recent hemoglobin A1c was 6.6%, LDL cholesterol 66, HDL 49.  Early postoperative course was complicated by an episode of syncope with fall and R orbital fracture and left wrist fracture and coronary/graft angiography showed that she had lost patency of all the SVGs.  The LIMA to LAD bypass was patent.  No intervention was performed.  Echocardiogram showed that left ventricular wall motion was abnormal,  significant for moderate hypokinesis of the apical segments of the anterior and anterolateral walls, but overall LVEF was normal at 55 to 60%.  Nuclear stress test 08/03/2020 showed LVEF 70% and a small severe scar in the apex, but no ischemia.  Diagnostic Dominance: Right      Repeat echocardiogram in August showed a residual apical lateral wall motion abnormality, but normal overall EF.  Her nuclear stress that showed similar findings with an apical scar, no evidence of reversible ischemia and normal left ventricular systolic function.    Past Medical History:  Diagnosis Date   Allergy    Anemia    past history long ago   Borderline HTN (hypertension)    CAD (coronary artery disease)    a. 02/2020 Cath: LM nl, LAD large, 90p, 89m, D1 90p, LCX nl, OM 60-70p, RCA dominant w/ dampening upon engagement of ostium. 70p/m, RPDA 50. EF 55-65%.   COVID-19 virus infection 02/2021   Diabetes (HCC)    Disorder of kidney    has abd kidney (right)   GERD (gastroesophageal reflux disease)    History of diabetic gastroparesis    History of echocardiogram    a. 01/2013 Echo: EF 55-65%, no rwma. Mild MR. Nl RV fxn.   HLD (hyperlipidemia)    Hx: UTI (urinary tract infection)    IBS (irritable bowel syndrome)    Nephrolithiasis 2005   Osteopenia 2012   per pt   Reflux esophagitis    Systolic  murmur 01/2013   mild mitral insuff   Type 1 diabetes mellitus with diabetic retinopathy without macular edema (HCC)    dx at 61 y/o, background retinopathy    Past Surgical History:  Procedure Laterality Date   APPENDECTOMY     CESAREAN SECTION  1985   COLONOSCOPY  07/2016   WNL (Pyrtle)   CORONARY ARTERY BYPASS GRAFT N/A 02/21/2020   Procedure: CORONARY ARTERY BYPASS GRAFTING (CABG) times five using left internal mammary and right leg saphenous vein;  Surgeon: Corliss Skains, MD;  Location: [facility] OR;  Service: Open Heart Surgery;  Laterality: N/A;   ESOPHAGOGASTRODUODENOSCOPY  07/2016   LA  Grade C reflux esophagitis, concern for stasis/dysmotility ([place]rtle)   LAPAROSCOPIC APPENDECTOMY N/A 09/01/2015   Procedure: APPENDECTOMY LAPAROSCOPIC;  Surgeon: Jimmye Norman, MD;  Location: [facility] OR;  Service: General;  Laterality: N/A;   LEFT HEART CATH AND CORONARY ANGIOGRAPHY N/A 02/17/2020   Procedure: LEFT HEART CATH AND CORONARY ANGIOGRAPHY;  Surgeon: Antonieta Iba, MD;  Location: [facility] INVASIVE CV LAB;  Service: Cardiovascular;  Laterality: N/A;   LEFT HEART CATH AND CORS/GRAFTS ANGIOGRAPHY N/A 02/29/2020   early failure of 4/5 bypass grafts - medically managed Clifton James, Nile Dear, MD)   ORIF FEMUR FRACTURE Left 1996   Corrected by patient in history 2016   PARTIAL HYSTERECTOMY  2000   paps by [facility]   TEE WITHOUT CARDIOVERSION N/A 02/21/2020   Procedure: TRANSESOPHAGEAL ECHOCARDIOGRAM (TEE);  Surgeon: Corliss Skains, MD;  Location: [facility] OR;  Service: Open Heart Surgery;  Laterality: N/A;   TONSILLECTOMY AND ADENOIDECTOMY  1975   TRIGGER FINGER RELEASE Left 12/2010   thumb    Current Medications: Current Meds  Medication Sig   aspirin 81 MG EC tablet Take 1 tablet (81 mg total) by mouth daily.   Continuous Glucose Sensor (DEXCOM G7 SENSOR) MISC CHANGE SENSOR EVERY 10 DAYS   esomeprazole (NEXIUM) 40 MG capsule Take 1 capsule (40 mg total) by mouth daily. Patient needs office visit for further refills   Evolocumab (REPATHA SURECLICK) 140 MG/ML SOAJ Inject 140 mg into the skin every 14 (fourteen) days.   Glucagon 3 MG/DOSE POWD Place 3 mg into the nose once as needed for up to 1 dose.   insulin NPH Human (HUMULIN N) 100 UNIT/ML injection Inject 0.08 mLs (8 Units total) into the skin at bedtime.   metoprolol succinate (TOPROL-XL) 25 MG 24 hr tablet TAKE 1 TABLET (25 MG TOTAL) BY MOUTH DAILY.   Multiple Vitamin (MULTIVITAMIN) tablet Take 1 tablet by mouth daily.   nitroGLYCERIN (NITROSTAT) 0.4 MG SL tablet Place 1 tablet (0.4 mg total) under the tongue every 5  (five) minutes as needed for chest pain.   NOVOLIN R 100 UNIT/ML injection INJECT 6 UNITS TOTAL INTO THE SKIN 3 (THREE) TIMES DAILY BEFORE MEALS.   TECHLITE INSULIN SYRINGE 31G X 5/16" 0.3 ML MISC USE UP TO 5 TIMES DAILY TO INJECT INSULIN DAILY   tolterodine (DETROL LA) 2 MG 24 hr capsule Take 2 mg by mouth daily.     Allergies:   Protonix [pantoprazole], Toujeo solostar [insulin glargine], Lactose intolerance (gi), Magnesium-containing compounds, and Adhesive [tape]   Social History   Socioeconomic History   Marital status: Married    Spouse name: Not on file   Number of children: 1   Years of education: 16   Highest education level: Bachelor's degree (e.g., BA, AB, BS)  Occupational History   Occupation: HR Chemical engineer: OTHER  Comment: [employer]  Tobacco Use   Smoking status: Never    Passive exposure: Never   Smokeless tobacco: Never  Vaping Use   Vaping status: Never Used  Substance and Sexual Activity   Alcohol use: No    Alcohol/week: 0.0 standard drinks of alcohol   Drug use: No   Sexual activity: Yes    Birth control/protection: None    Comment: Married  Other Topics Concern   Not on file  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  Has one daughter.  Education: college.      1 dog   Social Determinants of Corporate investment banker Strain: Not on file  Food Insecurity: Not on file  Transportation Needs: Not on file  Physical Activity: Not on file  Stress: Not on file  Social Connections: Not on file     Family History: The patient's family history includes Breast cancer in an other family member; CAD (age of onset: 89) in her brother; COPD in her maternal grandmother; Colon cancer in her maternal grandfather; Colon polyps in her mother; Diabetes in her maternal grandmother; Heart failure in her maternal grandmother; Thyroid disease in her maternal grandmother;  Vascular Disease in her maternal grandmother. There is no history of Esophageal cancer, Rectal cancer, or Stomach cancer.  ROS:   Please see the history of present illness.     All other systems reviewed and are negative.  EKGs/Labs/Other Studies Reviewed:    The following studies were reviewed today: Cardiac catheterizations performed on 4/15 (before bypass) and 4/27 (after bypass surgery) Echocardiograms performed on 4/15 and 4/25  Nuclear stress test 08/03/2020    The left ventricular ejection fraction is hyperdynamic (>65%). Nuclear stress EF: 70%. There was no ST segment deviation noted during stress. Defect 1: There is a small defect of severe severity present in the apical anterior and apex location.   There is a small irreversible defect of severe severity present in the apical anterior and apex location consistent with prior infarct in the distal LAD territory, there is no peri-infarct ischemia, overall LVEF is normal.     Echo 06/12/2020  1. Left ventricular ejection fraction, by estimation, is 55 to 60%. The  left ventricle has normal function. The left ventricle has no regional  wall motion abnormalities. Left ventricular diastolic parameters are  consistent with Grade II diastolic  dysfunction (pseudonormalization).   2. Right ventricular systolic function is normal. The right ventricular  size is normal.   3. The mitral valve is normal in structure. Mild mitral valve  regurgitation. No evidence of mitral stenosis.   4. The aortic valve is tricuspid. Aortic valve regurgitation is not  visualized. Mild aortic valve sclerosis is present, with no evidence of  aortic valve stenosis.   5. The inferior vena cava is normal in size with greater than 50%  respiratory variability, suggesting right atrial pressure of 3 mmHg.   EKG:  EKG is ordered today.  It is really not changed from previous tracings.  It shows sinus rhythm and generally low voltage QRS and tiny R waves in  leads V1-V2, but no repolarization abnormalities or frank Q waves. The QTc is 388 ms.    Recent Labs: 08/13/2022: ALT 15; BUN 13; Creatinine, Ser 1.16; Potassium 4.7; Sodium 138  Recent Lipid Panel    Component Value Date/Time   CHOL 138 08/13/2022 0744  TRIG 116.0 08/13/2022 0744   HDL 49.40 08/13/2022 0744   CHOLHDL 3 08/13/2022 0744   VLDL 23.2 08/13/2022 0744   LDLCALC 66 08/13/2022 0744   LDLDIRECT 120.0 06/17/2019 0738    Physical Exam:    VS:  BP (!) 171/81   Pulse (!) 55   Ht 5\' 4"  (1.626 m)   Wt 134 lb 3.2 oz (60.9 kg)   SpO2 97%   BMI 23.04 kg/m     Wt Readings from Last 3 Encounters:  07/11/23 134 lb 3.2 oz (60.9 kg)  01/08/23 136 lb 3.2 oz (61.8 kg)  11/01/22 136 lb 3.2 oz (61.8 kg)      General: Alert, oriented x3, no distress, very petite, lean Head: no evidence of trauma, PERRL, EOMI, no exophtalmos or lid lag, no myxedema, no xanthelasma; normal ears, nose and oropharynx Neck: normal jugular venous pulsations and no hepatojugular reflux; brisk carotid pulses without delay and no carotid bruits Chest: clear to auscultation, no signs of consolidation by percussion or palpation, normal fremitus, symmetrical and full respiratory excursions Cardiovascular: normal position and quality of the apical impulse, regular rhythm, normal first and second heart sounds, no murmurs, rubs or gallops Abdomen: no tenderness or distention, no masses by palpation, no abnormal pulsatility or arterial bruits, normal bowel sounds, no hepatosplenomegaly Extremities: no clubbing, cyanosis or edema; 2+ radial, ulnar and brachial pulses bilaterally; 2+ right femoral, posterior tibial and dorsalis pedis pulses; 2+ left femoral, posterior tibial and dorsalis pedis pulses; no subclavian or femoral bruits Neurological: grossly nonfocal Psych: Normal mood and affect     ASSESSMENT:    1. Coronary artery disease of bypass graft of native heart with stable angina pectoris (HCC)   2.  Hyperlipidemia due to type 1 diabetes mellitus (HCC)   3. Controlled type 1 diabetes mellitus with complication, with long-term current use of insulin (HCC)   4. Angina pectoris (HCC)        PLAN:    In order of problems listed above:  CAD s/p CABG: She did well for a while but is now having some symptoms concerning for angina.  Will schedule for a PET scan.  A conventional SPECT scan is unlikely to be helpful since she may have multiple areas of balanced ischemia after losing all her vein grafts.   She did not tolerate higher doses of beta-blocker in the past and we were never able to have calcium channel blockers, but now her BP appears to be higher.  And in the past has had intolerable headaches with long-acting nitrates.  Revascularization options may be very limited.  Not withstanding the blood pressure recorded today, usually her blood pressure is low and would not allow calcium channel blockers.  If angina does become a more significant problem, consider Ranexa as the next step. HLP: All lipid parameters are in target range.  Even her triglycerides have now reached normal range without specific triglyceride lowering medications.  Continue Repatha. DM: Very good glycemic control.  Type 1 diabetes mellitus for 45 years. She is very insulin sensitive.  Followed by Dr. Elvera Lennox in the endocrinology clinic. Elevated blood pressure seems to be situational and is decidedly unusual for her.  No change in medications.  Continue monitoring at home and make sure we check her home blood pressure monitor against an office standard.   Medication Adjustments/Labs and Tests Ordered: Current medicines are reviewed at length with the patient today.  Concerns regarding medicines are outlined above.  Orders Placed This Encounter  Procedures   NM PET CT CARDIAC PERFUSION MULTI W/ABSOLUTE BLOODFLOW   Cardiac Stress Test: Informed Consent Details: Physician/Practitioner Attestation; Transcribe to consent form  and obtain patient signature   EKG 12-Lead   No orders of the defined types were placed in this encounter.   Patient Instructions  Medication Instructions:  Take your Metoprolol at bedtime *If you need a refill on your cardiac medications before your next appointment, please call your pharmacy*  Testing/Procedures: How to Prepare for Your Cardiac PET/CT Stress Test:  1. Please do not take these medications before your test:   Medications that may interfere with the cardiac pharmacological stress agent (ex. nitrates - including erectile dysfunction medications, isosorbide mononitrate, tamulosin or beta-blockers) the day of the exam. (Erectile dysfunction medication should be held for at least 72 hrs prior to test) Theophylline containing medications for 12 hours. Dipyridamole 48 hours prior to the test. Your remaining medications may be taken with water.  2. Nothing to eat or drink, except water, 3 hours prior to arrival time.   NO caffeine/decaffeinated products, or chocolate 12 hours prior to arrival.  3. NO perfume, cologne or lotion on chest or abdomen area.          - FEMALES - Please avoid wearing dresses to this appointment.  4. Total time is 1 to 2 hours; you may want to bring reading material for the waiting time.  5. Please report to Radiology at the [facility] Main Entrance 30 minutes early for your test.  [address] [place], [place] [address]  6. Please report to Radiology at [facility] Main Entrance, medical mall, 30 mins prior to your test.  [address]  [place], [place]  [phone]  Diabetic Preparation:  Hold oral medications. You may take NPH and Lantus insulin. Do not take Humalog or Humulin R (Regular Insulin) the day of your test. Check blood sugars prior to leaving the house. If able to eat breakfast prior to 3 hour fasting, you may take all medications, including your insulin, Do not worry if you miss  your breakfast dose of insulin - start at your next meal. Patients who wear a continuous glucose monitor MUST remove the device prior to scanning.  IF YOU THINK YOU MAY BE PREGNANT, OR ARE NURSING PLEASE INFORM THE TECHNOLOGIST.  In preparation for your appointment, medication and supplies will be purchased.  Appointment availability is limited, so if you need to cancel or reschedule, please call the Radiology Department at [phone] [facility]) OR [phone] [facility])  24 hours in advance to avoid a cancellation fee of $100.00  What to Expect After you Arrive:  Once you arrive and check in for your appointment, you will be taken to a preparation room within the Radiology Department.  A technologist or Nurse will obtain your medical history, verify that you are correctly prepped for the exam, and explain the procedure.  Afterwards,  an IV will be started in your arm and electrodes will be placed on your skin for EKG monitoring during the stress portion of the exam. Then you will be escorted to the PET/CT scanner.  There, staff will get you positioned on the scanner and obtain a blood pressure and EKG.  During the exam, you will continue to be connected to the EKG and blood pressure machines.  A small, safe amount of a radioactive tracer will be injected in your IV to obtain a series of pictures of your heart along with  an injection of a stress agent.    After your Exam:  It is recommended that you eat a meal and drink a caffeinated beverage to counter act any effects of the stress agent.  Drink plenty of fluids for the remainder of the day and urinate frequently for the first couple of hours after the exam.  Your doctor will inform you of your test results within 7-10 business days.  For more information and frequently asked questions, please visit our website : [email or web address]  For questions about your test or how to prepare for your test, please call: Cardiac Imaging Nurse  Navigators Office: [phone]    Follow-Up: At [facility], you and your health needs are our priority.  As part of our continuing mission to provide you with exceptional heart care, we have created designated Provider Care Teams.  These Care Teams include your primary Cardiologist (physician) and Advanced Practice Providers (APPs -  Physician Assistants and Nurse Practitioners) who all work together to provide you with the care you need, when you need it.  We recommend signing up for the patient portal called "MyChart".  Sign up information is provided on this After Visit Summary.  MyChart is used to connect with patients for Virtual Visits (Telemedicine).  Patients are able to view lab/test results, encounter notes, upcoming appointments, etc.  Non-urgent messages can be sent to your provider as well.   To learn more about what you can do with MyChart, go to [email or web address].    Your next appointment:   6 month(s)  Provider:   Thurmon Fair, MD       Signed, Thurmon Fair, MD  07/12/2023 5:35 PM    [facility]

## 2023-07-11 ENCOUNTER — Ambulatory Visit: Payer: Managed Care, Other (non HMO) | Attending: Cardiovascular Disease | Admitting: Cardiovascular Disease

## 2023-07-11 ENCOUNTER — Encounter: Payer: Self-pay | Admitting: Cardiovascular Disease

## 2023-07-11 VITALS — BP 171/81 | HR 55 | Ht 64.0 in | Wt 134.2 lb

## 2023-07-11 DIAGNOSIS — E108 Type 1 diabetes mellitus with unspecified complications: Secondary | ICD-10-CM | POA: Diagnosis not present

## 2023-07-11 DIAGNOSIS — I209 Angina pectoris, unspecified: Secondary | ICD-10-CM

## 2023-07-11 DIAGNOSIS — Z794 Long term (current) use of insulin: Secondary | ICD-10-CM

## 2023-07-11 DIAGNOSIS — I25708 Atherosclerosis of coronary artery bypass graft(s), unspecified, with other forms of angina pectoris: Secondary | ICD-10-CM

## 2023-07-11 DIAGNOSIS — E1069 Type 1 diabetes mellitus with other specified complication: Secondary | ICD-10-CM

## 2023-07-11 DIAGNOSIS — E785 Hyperlipidemia, unspecified: Secondary | ICD-10-CM | POA: Diagnosis not present

## 2023-07-11 NOTE — Patient Instructions (Signed)
Medication Instructions:  Take your Metoprolol at bedtime *If you need a refill on your cardiac medications before your next appointment, please call your pharmacy*  Testing/Procedures: How to Prepare for Your Cardiac PET/CT Stress Test:  1. Please do not take these medications before your test:   Medications that may interfere with the cardiac pharmacological stress agent (ex. nitrates - including erectile dysfunction medications, isosorbide mononitrate, tamulosin or beta-blockers) the day of the exam. (Erectile dysfunction medication should be held for at least 72 hrs prior to test) Theophylline containing medications for 12 hours. Dipyridamole 48 hours prior to the test. Your remaining medications may be taken with water.  2. Nothing to eat or drink, except water, 3 hours prior to arrival time.   NO caffeine/decaffeinated products, or chocolate 12 hours prior to arrival.  3. NO perfume, cologne or lotion on chest or abdomen area.          - FEMALES - Please avoid wearing dresses to this appointment.  4. Total time is 1 to 2 hours; you may want to bring reading material for the waiting time.  5. Please report to Radiology at the [facility] Main Entrance 30 minutes early for your test.  [address] [place], [place] [address]  6. Please report to Radiology at [facility] Main Entrance, medical mall, 30 mins prior to your test.  [address]  [place], [place]  [phone]  Diabetic Preparation:  Hold oral medications. You may take NPH and Lantus insulin. Do not take Humalog or Humulin R (Regular Insulin) the day of your test. Check blood sugars prior to leaving the house. If able to eat breakfast prior to 3 hour fasting, you may take all medications, including your insulin, Do not worry if you miss your breakfast dose of insulin - start at your next meal. Patients who wear a continuous glucose monitor MUST remove the device prior to  scanning.  IF YOU THINK YOU MAY BE PREGNANT, OR ARE NURSING PLEASE INFORM THE TECHNOLOGIST.  In preparation for your appointment, medication and supplies will be purchased.  Appointment availability is limited, so if you need to cancel or reschedule, please call the Radiology Department at [phone] [facility]) OR [phone] [facility])  24 hours in advance to avoid a cancellation fee of $100.00  What to Expect After you Arrive:  Once you arrive and check in for your appointment, you will be taken to a preparation room within the Radiology Department.  A technologist or Nurse will obtain your medical history, verify that you are correctly prepped for the exam, and explain the procedure.  Afterwards,  an IV will be started in your arm and electrodes will be placed on your skin for EKG monitoring during the stress portion of the exam. Then you will be escorted to the PET/CT scanner.  There, staff will get you positioned on the scanner and obtain a blood pressure and EKG.  During the exam, you will continue to be connected to the EKG and blood pressure machines.  A small, safe amount of a radioactive tracer will be injected in your IV to obtain a series of pictures of your heart along with an injection of a stress agent.    After your Exam:  It is recommended that you eat a meal and drink a caffeinated beverage to counter act any effects of the stress agent.  Drink plenty of fluids for the remainder of the day and urinate frequently for the first  couple of hours after the exam.  Your doctor will inform you of your test results within 7-10 business days.  For more information and frequently asked questions, please visit our website : [email or web address]  For questions about your test or how to prepare for your test, please call: Cardiac Imaging Nurse Navigators Office: [phone]    Follow-Up: At [facility], you and your health needs are our priority.  As part of our  continuing mission to provide you with exceptional heart care, we have created designated Provider Care Teams.  These Care Teams include your primary Cardiologist (physician) and Advanced Practice Providers (APPs -  Physician Assistants and Nurse Practitioners) who all work together to provide you with the care you need, when you need it.  We recommend signing up for the patient portal called "MyChart".  Sign up information is provided on this After Visit Summary.  MyChart is used to connect with patients for Virtual Visits (Telemedicine).  Patients are able to view lab/test results, encounter notes, upcoming appointments, etc.  Non-urgent messages can be sent to your provider as well.   To learn more about what you can do with MyChart, go to [email or web address].    Your next appointment:   6 month(s)  Provider:   Thurmon Fair, MD

## 2023-07-12 ENCOUNTER — Encounter: Payer: Self-pay | Admitting: Cardiovascular Disease

## 2023-07-22 ENCOUNTER — Encounter: Payer: Self-pay | Admitting: Cardiovascular Disease

## 2023-08-05 ENCOUNTER — Encounter (HOSPITAL_COMMUNITY): Payer: Self-pay

## 2023-08-07 ENCOUNTER — Ambulatory Visit
Admission: RE | Admit: 2023-08-07 | Discharge: 2023-08-07 | Disposition: A | Discharge status: Home / Self Care | Payer: Managed Care, Other (non HMO) | Source: Ambulatory Visit | Attending: Cardiovascular Disease | Admitting: Cardiovascular Disease

## 2023-08-07 DIAGNOSIS — I25118 Atherosclerotic heart disease of native coronary artery with other forms of angina pectoris: Secondary | ICD-10-CM | POA: Diagnosis present

## 2023-08-07 DIAGNOSIS — I4711 Inappropriate sinus tachycardia, so stated: Secondary | ICD-10-CM | POA: Insufficient documentation

## 2023-08-07 DIAGNOSIS — I209 Angina pectoris, unspecified: Secondary | ICD-10-CM

## 2023-08-07 DIAGNOSIS — I25708 Atherosclerosis of coronary artery bypass graft(s), unspecified, with other forms of angina pectoris: Secondary | ICD-10-CM

## 2023-08-07 MED ORDER — RUBIDIUM RB82 GENERATOR (RUBYFILL)
25.0000 | PACK | Freq: Once | INTRAVENOUS | Status: AC
  Administered 2023-08-07: 15.66 via INTRAVENOUS

## 2023-08-07 MED ORDER — CAFFEINE CITRATE BASE COMPONENT 10 MG/ML IV SOLN
INTRAVENOUS | Status: AC
  Filled 2023-08-07: qty 3

## 2023-08-07 MED ORDER — REGADENOSON 0.4 MG/5ML IV SOLN
INTRAVENOUS | Status: AC
  Filled 2023-08-07: qty 5

## 2023-08-07 MED ORDER — REGADENOSON 0.4 MG/5ML IV SOLN
0.4000 mg | Freq: Once | INTRAVENOUS | Status: AC
  Administered 2023-08-07: 0.4 mg via INTRAVENOUS

## 2023-08-07 MED ORDER — DEXTROSE 5 % IV SOLN
INTRAVENOUS | Status: AC
  Filled 2023-08-07: qty 50

## 2023-08-07 MED ORDER — RUBIDIUM RB82 GENERATOR (RUBYFILL)
25.0000 | PACK | Freq: Once | INTRAVENOUS | Status: AC
  Administered 2023-08-07: 15.94 via INTRAVENOUS

## 2023-08-07 MED ORDER — RUBIDIUM RB82 GENERATOR (RUBYFILL)
25.0000 | PACK | Freq: Once | INTRAVENOUS | Status: AC
  Administered 2023-08-07: 15.75 via INTRAVENOUS

## 2023-08-07 NOTE — Progress Notes (Signed)
Patient presents for a cardiac PET stress test and tolerated procedure without incident. Patient maintained acceptable vital signs throughout the test and was offered caffeine after test.  Patient ambulated out of department with a steady gait.  

## 2023-08-08 ENCOUNTER — Encounter: Payer: Self-pay | Admitting: Cardiovascular Disease

## 2023-08-08 LAB — NM PET CT CARDIAC PERFUSION MULTI W/ABSOLUTE BLOODFLOW
MBFR: 1.59
Nuc Rest EF: 67 %
Nuc Stress EF: 69 %
Peak HR: 80 {beats}/min
Rest HR: 73 {beats}/min
Rest MBF: 1.2 ml/g/min
Rest Nuclear Isotope Dose: 15.9 mCi
SRS: 9
SSS: 20
ST Depression (mm): 0 mm
Stress MBF: 1.91 ml/g/min
Stress Nuclear Isotope Dose: 15.8 mCi
TID: 0.9

## 2023-08-10 NOTE — Telephone Encounter (Signed)
I sent her an explanation via mychart. Please tell her I am sorry I did not have time to call.

## 2023-08-13 ENCOUNTER — Other Ambulatory Visit: Payer: Self-pay | Admitting: Emergency Medicine

## 2023-08-13 DIAGNOSIS — I25708 Atherosclerosis of coronary artery bypass graft(s), unspecified, with other forms of angina pectoris: Secondary | ICD-10-CM

## 2023-08-13 DIAGNOSIS — R9439 Abnormal result of other cardiovascular function study: Secondary | ICD-10-CM

## 2023-08-13 NOTE — Telephone Encounter (Signed)
See result management notation Dr Renaye Rakers Nurse responded to patient . Referral order is completed for patient to see Dr Clifton James

## 2023-08-22 ENCOUNTER — Other Ambulatory Visit: Payer: Self-pay | Admitting: Internal Medicine

## 2023-08-22 NOTE — Telephone Encounter (Signed)
Techlite insulin syringe refill request complete

## 2023-08-27 ENCOUNTER — Encounter: Payer: Self-pay | Admitting: Cardiovascular Disease

## 2023-08-27 ENCOUNTER — Ambulatory Visit: Payer: Managed Care, Other (non HMO) | Attending: Cardiovascular Disease | Admitting: Cardiovascular Disease

## 2023-08-27 VITALS — BP 138/72 | HR 59 | Ht 64.0 in | Wt 134.8 lb

## 2023-08-27 DIAGNOSIS — Z01812 Encounter for preprocedural laboratory examination: Secondary | ICD-10-CM

## 2023-08-27 DIAGNOSIS — I25708 Atherosclerosis of coronary artery bypass graft(s), unspecified, with other forms of angina pectoris: Secondary | ICD-10-CM

## 2023-08-27 NOTE — Progress Notes (Signed)
Chief Complaint  Patient presents with   New Patient (Initial Visit)    CAD/Chest pain   History of Present Illness: 62 yo female with history of CAD s/p CABG, diabetes, HLD who is here today as a new consult in my interventional cardiology clinic for a second opinion on her CAD. She had 5V CABG in April 2021 and one week later was noted to have graft failure of all of the vein grafts. The LIMA to the LAD was patent. There was competitive flow into the right posterolateral artery but I could not locate that graft on the aortic wall. I felt that medical management of her CAD was the right option at that time. She has been followed by Dr. Royann Shivers and has done well with medical management of her CAD. She began having some chest discomfort in August 2024. She has been on Toprol but has not tolerated long acting nitrates secondary to headaches. With titration of her Toprol and dosing change to the evening, she has felt somewhat better. PET CT cardiac stress test 08/07/23 with perfusion defect in the lateral wall and in the inferior wall. She reviewed this with Dr. Royann Shivers and he referred her to see me today to discuss the utility of repeating her cardiac cath at this time. She has been very active and notes dyspnea and chest pressure with moderate to heavy exertion. She is concerned about the findings on her stress testing.  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 She denies palpitations, dizziness, near syncope, syncope or LE edema.   Primary Care Physician: [name], MD   Past Medical History:  Diagnosis Date   Allergy    Anemia    past history long ago   Borderline HTN (hypertension)    CAD (coronary artery disease)    a. 02/2020 Cath: LM nl, LAD large, 90p, 57m, D1 90p, LCX nl, OM 60-70p, RCA dominant w/ dampening upon engagement of ostium. 70p/m, RPDA 50. EF 55-65%.   COVID-19 virus infection 02/2021   Diabetes (HCC)    Disorder of kidney    has abd kidney (right)    GERD (gastroesophageal reflux disease)    History of diabetic gastroparesis    History of echocardiogram    a. 01/2013 Echo: EF 55-65%, no rwma. Mild MR. Nl RV fxn.   HLD (hyperlipidemia)    Hx: UTI (urinary tract infection)    IBS (irritable bowel syndrome)    Nephrolithiasis 2005   Osteopenia 2012   per pt   Reflux esophagitis    Systolic murmur 01/2013   mild mitral insuff   Type 1 diabetes mellitus with diabetic retinopathy without macular edema (HCC)    dx at 62 y/o, background retinopathy    Past Surgical History:  Procedure Laterality Date   APPENDECTOMY     CESAREAN SECTION  1985   COLONOSCOPY  07/2016   WNL ([facility])   CORONARY ARTERY BYPASS GRAFT N/A 02/21/2020   Procedure: CORONARY ARTERY BYPASS GRAFTING (CABG) times five using left internal mammary and right leg saphenous vein;  Surgeon: Corliss Skains, MD;  Location: [facility] OR;  Service: Open Heart Surgery;  Laterality: N/A;   ESOPHAGOGASTRODUODENOSCOPY  07/2016   LA Grade C reflux esophagitis, concern for stasis/dysmotility ([place])   LAPAROSCOPIC APPENDECTOMY N/A 09/01/2015   Procedure: APPENDECTOMY LAPAROSCOPIC;  Surgeon: Jimmye Norman, MD;  Location: [facility] OR;  Service: General;  Laterality: N/A;   LEFT HEART CATH AND CORONARY ANGIOGRAPHY N/A 02/17/2020   Procedure:  LEFT HEART CATH AND CORONARY ANGIOGRAPHY;  Surgeon: Antonieta Iba, MD;  Location: [facility] INVASIVE CV LAB;  Service: Cardiovascular;  Laterality: N/A;   LEFT HEART CATH AND CORS/GRAFTS ANGIOGRAPHY N/A 02/29/2020   early failure of 4/5 bypass grafts - medically managed Clifton James, Nile Dear, MD)   ORIF FEMUR FRACTURE Left 1996   Corrected by patient in history 2016   PARTIAL HYSTERECTOMY  2000   paps by [facility]   TEE WITHOUT CARDIOVERSION N/A 02/21/2020   Procedure: TRANSESOPHAGEAL ECHOCARDIOGRAM (TEE);  Surgeon: Corliss Skains, MD;  Location: [facility] OR;  Service: Open Heart Surgery;  Laterality: N/A;   TONSILLECTOMY AND ADENOIDECTOMY   1975   TRIGGER FINGER RELEASE Left 12/2010   thumb    Current Outpatient Medications  Medication Sig Dispense Refill   aspirin 81 MG EC tablet Take 1 tablet (81 mg total) by mouth daily.     Continuous Glucose Sensor (DEXCOM G7 SENSOR) MISC CHANGE SENSOR EVERY 10 DAYS 9 each 1   esomeprazole (NEXIUM) 40 MG capsule Take 1 capsule (40 mg total) by mouth daily. Patient needs office visit for further refills 90 capsule 0   Evolocumab (REPATHA SURECLICK) 140 MG/ML SOAJ Inject 140 mg into the skin every 14 (fourteen) days. 6 mL 3   Glucagon 3 MG/DOSE POWD Place 3 mg into the nose once as needed for up to 1 dose. 1 each 11   insulin NPH Human (HUMULIN N) 100 UNIT/ML injection Inject 0.08 mLs (8 Units total) into the skin at bedtime. 10 mL 5   metoprolol succinate (TOPROL-XL) 25 MG 24 hr tablet TAKE 1 TABLET (25 MG TOTAL) BY MOUTH DAILY. 90 tablet 1   Multiple Vitamin (MULTIVITAMIN) tablet Take 1 tablet by mouth daily.     nitroGLYCERIN (NITROSTAT) 0.4 MG SL tablet Place 1 tablet (0.4 mg total) under the tongue every 5 (five) minutes as needed for chest pain. 25 tablet 3   NOVOLIN R 100 UNIT/ML injection INJECT 6 UNITS TOTAL INTO THE SKIN 3 (THREE) TIMES DAILY BEFORE MEALS. 90 mL 1   TECHLITE INSULIN SYRINGE 31G X 5/16" 0.3 ML MISC USE UP TO 5 TIMES DAILY TO INJECT INSULIN DAILY 300 each 1   tolterodine (DETROL LA) 2 MG 24 hr capsule Take 2 mg by mouth daily.     No current facility-administered medications for this visit.    Allergies  Allergen Reactions   Protonix [Pantoprazole] Swelling    Facial Swelling   Toujeo Solostar [Insulin Glargine] Shortness Of Breath   Lactose Intolerance (Gi)     GI   Magnesium-Containing Compounds Swelling and Other (See Comments)    limbs   Adhesive [Tape] Rash and Other (See Comments)    Band aids    Social History   Socioeconomic History   Marital status: Married    Spouse name: Not on file   Number of children: 1   Years of education: 16    Highest education level: Bachelor's degree (e.g., BA, AB, BS)  Occupational History   Occupation: HR Chemical engineer: OTHER    Comment: Scientist, research (medical)  Tobacco Use   Smoking status: Never    Passive exposure: Never   Smokeless tobacco: Never  Vaping Use   Vaping status: Never Used  Substance and Sexual Activity   Alcohol use: No    Alcohol/week: 0.0 standard drinks of alcohol   Drug use: No   Sexual activity: Yes    Birth control/protection: None    Comment:  Married  Other Topics Concern   Not on file  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  Has one daughter.  Education: college.      1 dog   Social Determinants of Corporate investment banker Strain: Not on file  Food Insecurity: Not on file  Transportation Needs: Not on file  Physical Activity: Not on file  Stress: Not on file  Social Connections: Not on file  Intimate Partner Violence: Not on file    Family History  Problem Relation Age of Onset   Diabetes Maternal Grandmother    Heart failure Maternal Grandmother    COPD Maternal Grandmother        smoker   Thyroid disease Maternal Grandmother    Vascular Disease Maternal Grandmother    Colon cancer Maternal Grandfather    Colon polyps Mother    Breast cancer Other    CAD Brother 42       stent   Esophageal cancer Neg Hx    Rectal cancer Neg Hx    Stomach cancer Neg Hx     Review of Systems:  As stated in the HPI and otherwise negative.   BP 138/72   Pulse (!) 59   Ht 5\' 4"  (1.626 m)   Wt 61.1 kg   SpO2 100%   BMI 23.14 kg/m   Physical Examination: General: Well developed, well nourished, NAD  HEENT: OP clear, mucus membranes moist  SKIN: warm, dry. No rashes. Neuro: No focal deficits  Musculoskeletal: Muscle strength 5/5 all ext  Psychiatric: Mood and affect normal  Neck: No JVD, no carotid bruits, no thyromegaly, no lymphadenopathy.  Lungs:Clear  bilaterally, no wheezes, rhonci, crackles Cardiovascular: Regular rate and rhythm. No murmurs, gallops or rubs. Abdomen:Soft. Bowel sounds present. Non-tender.  Extremities: No lower extremity edema. Pulses are 2 + in the bilateral DP/PT.  EKG:  EKG is ordered today. The ekg ordered today demonstrates  EKG Interpretation Date/Time:  Wednesday August 27 2023 08:37:23 EDT Ventricular Rate:  57 PR Interval:  148 QRS Duration:  70 QT Interval:  412 QTC Calculation: 401 R Axis:   112  Text Interpretation: Sinus bradycardia Low voltage QRS Septal infarct (cited on or before 27-Aug-2023) Lateral infarct , age undetermined Confirmed by Verne Carrow [phone]) on 08/27/2023 8:41:54 AM    Recent Labs: No results found for requested labs within last 365 days.   Lipid Panel    Component Value Date/Time   CHOL 138 08/13/2022 0744   TRIG 116.0 08/13/2022 0744   HDL 49.40 08/13/2022 0744   CHOLHDL 3 08/13/2022 0744   VLDL 23.2 08/13/2022 0744   LDLCALC 66 08/13/2022 0744   LDLDIRECT 120.0 06/17/2019 0738     Wt Readings from Last 3 Encounters:  08/27/23 61.1 kg  07/11/23 60.9 kg  01/08/23 61.8 kg    Assessment and Plan:   1. CAD s/p CABG with angina: She has stable angina but PET CT perfusion study suggests anterolateral and inferior wall perfusion defects. She is having chest pressure and dyspnea with moderate to heavy exertion. She has been intolerant of escalation of anti-anginal therapy. I have reviewed her cardiac cath films from 2021 with the patient and her husband. I think given her symptoms and the abnormal stress test findings that it is reasonable to perform a cardiac cath. Her RCA is a large dominant vessel with moderate ostial stenosis and  severe mid stenosis by cath in 2021. There was competitive flow into the posterolateral artery from a graft but I could not locate this graft on the aorta. The RCA would be a favorable vessel for stent placement if deemed to be  necessary.   I also discussed the option of ongoing medical therapy but she would rather proceed with cardiac cath to define the extent of her CAD.   I will arrange a cardiac cath at [facility] on 09/12/23 at 9am.   I have reviewed the risks, indications, and alternatives to cardiac catheterization, possible angioplasty, and stenting with the patient. Risks include but are not limited to bleeding, infection, vascular injury, stroke, myocardial infection, arrhythmia, kidney injury, radiation-related injury in the case of prolonged fluoroscopy use, emergency cardiac surgery, and death. The patient understands the risks of serious complication is 1-2 in 1000 with diagnostic cardiac cath and 1-2% or less with angioplasty/stenting.   Labs/ tests ordered today include:   Orders Placed This Encounter  Procedures   Basic Metabolic Panel (BMET)   CBC   EKG 12-Lead    Disposition:   F/U with Dr. Royann Shivers following her cath.     Signed, Verne Carrow, MD, [facility] 08/27/2023 8:56 AM    [facility] [address] [place], [place], [place]  [address] Phone: [phone]; Fax: [phone]

## 2023-08-27 NOTE — Patient Instructions (Addendum)
Medication Instructions:  No changes *If you need a refill on your cardiac medications before your next appointment, please call your pharmacy*   Lab Work: Today: bmet, cbc   Testing/Procedures: Your physician has requested that you have a cardiac catheterization. Cardiac catheterization is used to diagnose and/or treat various heart conditions. Doctors may recommend this procedure for a number of different reasons. The most common reason is to evaluate chest pain. Chest pain can be a symptom of coronary artery disease (CAD), and cardiac catheterization can show whether plaque is narrowing or blocking your heart's arteries. This procedure is also used to evaluate the valves, as well as measure the blood flow and oxygen levels in different parts of your heart. For further information please visit [email or web address] Please follow instruction sheet, as given.   Follow-Up: At [facility], you and your health needs are our priority.  As part of our continuing mission to provide you with exceptional heart care, we have created designated Provider Care Teams.  These Care Teams include your primary Cardiologist (physician) and Advanced Practice Providers (APPs -  Physician Assistants and Nurse Practitioners) who all work together to provide you with the care you need, when you need it.   Your next appointment:   4-6 week(s)  Provider:   Dr. Royann Shivers or Advanced Practice Provider (NP or PA-C)  Other Instructions       Cardiac/Peripheral Catheterization   You are scheduled for a Cardiac Catheterization on Friday, November 8 with Dr. Verne Carrow.  1. Please arrive at the [facility] (Main Entrance A) at [facility]: [address] [place], [place] [address] at 7:00 AM (This time is TWO hour(s) before your procedure to ensure your preparation). Free valet parking service is available. You will check in at ADMITTING. The support person will be asked to wait in the  waiting room.  It is OK to have someone drop you off and come back when you are ready to be discharged.        Special note: Every effort is made to have your procedure done on time. Please understand that emergencies sometimes delay scheduled procedures.  2. Diet: Do not eat solid foods after midnight.  You may have clear liquids until 5 AM the day of the procedure.  3. Labs: You will need to have blood drawn today.  You do not need to be fasting.  4. Medication instructions in preparation for your procedure:  Take only half dose of bedtime insulin night before procedure No insulin on the day of procedure  Contrast Allergy: No  On the morning of your procedure, take Aspirin 81 mg and any morning medicines NOT listed above.  You may use sips of water.  5. Plan to go home the same day, you will only stay overnight if medically necessary. 6. You MUST have a responsible adult to drive you home. 7. An adult MUST be with you the first 24 hours after you arrive home. 8. Bring a current list of your medications, and the last time and date medication taken. 9. Bring ID and current insurance cards. 10.Please wear clothes that are easy to get on and off and wear slip-on shoes.  Thank you for allowing [place] to care for you!   -- [facility]

## 2023-08-27 NOTE — H&P (View-Only) (Signed)
Chief Complaint  Patient presents with   New Patient (Initial Visit)    CAD/Chest pain   History of Present Illness: 62 yo female with history of CAD s/p CABG, diabetes, HLD who is here today as a new consult in my interventional cardiology clinic for a second opinion on her CAD. She had 5V CABG in April 2021 and one week later was noted to have graft failure of all of the vein grafts. The LIMA to the LAD was patent. There was competitive flow into the right posterolateral artery but I could not locate that graft on the aortic wall. I felt that medical management of her CAD was the right option at that time. She has been followed by Dr. Royann Shivers and has done well with medical management of her CAD. She began having some chest discomfort in August 2024. She has been on Toprol but has not tolerated long acting nitrates secondary to headaches. With titration of her Toprol and dosing change to the evening, she has felt somewhat better. PET CT cardiac stress test 08/07/23 with perfusion defect in the lateral wall and in the inferior wall. She reviewed this with Dr. Royann Shivers and he referred her to see me today to discuss the utility of repeating her cardiac cath at this time. She has been very active and notes dyspnea and chest pressure with moderate to heavy exertion. She is concerned about the findings on her stress testing.  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 She denies palpitations, dizziness, near syncope, syncope or LE edema.   Primary Care Physician: [name], MD   Past Medical History:  Diagnosis Date   Allergy    Anemia    past history long ago   Borderline HTN (hypertension)    CAD (coronary artery disease)    a. 02/2020 Cath: LM nl, LAD large, 90p, 57m, D1 90p, LCX nl, OM 60-70p, RCA dominant w/ dampening upon engagement of ostium. 70p/m, RPDA 50. EF 55-65%.   COVID-19 virus infection 02/2021   Diabetes (HCC)    Disorder of kidney    has abd kidney (right)    GERD (gastroesophageal reflux disease)    History of diabetic gastroparesis    History of echocardiogram    a. 01/2013 Echo: EF 55-65%, no rwma. Mild MR. Nl RV fxn.   HLD (hyperlipidemia)    Hx: UTI (urinary tract infection)    IBS (irritable bowel syndrome)    Nephrolithiasis 2005   Osteopenia 2012   per pt   Reflux esophagitis    Systolic murmur 01/2013   mild mitral insuff   Type 1 diabetes mellitus with diabetic retinopathy without macular edema (HCC)    dx at 62 y/o, background retinopathy    Past Surgical History:  Procedure Laterality Date   APPENDECTOMY     CESAREAN SECTION  1985   COLONOSCOPY  07/2016   WNL ([facility]rt[facility])   CORONARY ARTERY BYPASS GRAFT N/A 02/21/2020   Procedure: CORONARY ARTERY BYPASS GRAFTING (CABG) times five using left internal mammary and right leg saphenous vein;  Surgeon: Corliss Skains, MD;  Location: [facility] OR;  Service: Open Heart Surgery;  Laterality: N/A;   ESOPHAGOGASTRODUODENOSCOPY  07/2016   LA Grade C reflux esophagitis, concern for stasis/dysmotility ([place]le)   LAPAROSCOPIC APPENDECTOMY N/A 09/01/2015   Procedure: APPENDECTOMY LAPAROSCOPIC;  Surgeon: Jimmye Norman, MD;  Location: [facility] OR;  Service: General;  Laterality: N/A;   LEFT HEART CATH AND CORONARY ANGIOGRAPHY N/A 02/17/2020   Procedure:  LEFT HEART CATH AND CORONARY ANGIOGRAPHY;  Surgeon: Antonieta Iba, MD;  Location: [facility] INVASIVE CV LAB;  Service: Cardiovascular;  Laterality: N/A;   LEFT HEART CATH AND CORS/GRAFTS ANGIOGRAPHY N/A 02/29/2020   early failure of 4/5 bypass grafts - medically managed Clifton James, Nile Dear, MD)   ORIF FEMUR FRACTURE Left 1996   Corrected by patient in history 2016   PARTIAL HYSTERECTOMY  2000   paps by [facility]   TEE WITHOUT CARDIOVERSION N/A 02/21/2020   Procedure: TRANSESOPHAGEAL ECHOCARDIOGRAM (TEE);  Surgeon: Corliss Skains, MD;  Location: [facility] OR;  Service: Open Heart Surgery;  Laterality: N/A;   TONSILLECTOMY AND ADENOIDECTOMY   1975   TRIGGER FINGER RELEASE Left 12/2010   thumb    Current Outpatient Medications  Medication Sig Dispense Refill   aspirin 81 MG EC tablet Take 1 tablet (81 mg total) by mouth daily.     Continuous Glucose Sensor (DEXCOM G7 SENSOR) MISC CHANGE SENSOR EVERY 10 DAYS 9 each 1   esomeprazole (NEXIUM) 40 MG capsule Take 1 capsule (40 mg total) by mouth daily. Patient needs office visit for further refills 90 capsule 0   Evolocumab (REPATHA SURECLICK) 140 MG/ML SOAJ Inject 140 mg into the skin every 14 (fourteen) days. 6 mL 3   Glucagon 3 MG/DOSE POWD Place 3 mg into the nose once as needed for up to 1 dose. 1 each 11   insulin NPH Human (HUMULIN N) 100 UNIT/ML injection Inject 0.08 mLs (8 Units total) into the skin at bedtime. 10 mL 5   metoprolol succinate (TOPROL-XL) 25 MG 24 hr tablet TAKE 1 TABLET (25 MG TOTAL) BY MOUTH DAILY. 90 tablet 1   Multiple Vitamin (MULTIVITAMIN) tablet Take 1 tablet by mouth daily.     nitroGLYCERIN (NITROSTAT) 0.4 MG SL tablet Place 1 tablet (0.4 mg total) under the tongue every 5 (five) minutes as needed for chest pain. 25 tablet 3   NOVOLIN R 100 UNIT/ML injection INJECT 6 UNITS TOTAL INTO THE SKIN 3 (THREE) TIMES DAILY BEFORE MEALS. 90 mL 1   TECHLITE INSULIN SYRINGE 31G X 5/16" 0.3 ML MISC USE UP TO 5 TIMES DAILY TO INJECT INSULIN DAILY 300 each 1   tolterodine (DETROL LA) 2 MG 24 hr capsule Take 2 mg by mouth daily.     No current facility-administered medications for this visit.    Allergies  Allergen Reactions   Protonix [Pantoprazole] Swelling    Facial Swelling   Toujeo Solostar [Insulin Glargine] Shortness Of Breath   Lactose Intolerance (Gi)     GI   Magnesium-Containing Compounds Swelling and Other (See Comments)    limbs   Adhesive [Tape] Rash and Other (See Comments)    Band aids    Social History   Socioeconomic History   Marital status: Married    Spouse name: Not on file   Number of children: 1   Years of education: 16    Highest education level: Bachelor's degree (e.g., BA, AB, BS)  Occupational History   Occupation: HR Chemical engineer: OTHER    Comment: Scientist, research (medical)  Tobacco Use   Smoking status: Never    Passive exposure: Never   Smokeless tobacco: Never  Vaping Use   Vaping status: Never Used  Substance and Sexual Activity   Alcohol use: No    Alcohol/week: 0.0 standard drinks of alcohol   Drug use: No   Sexual activity: Yes    Birth control/protection: None    Comment:  Married  Other Topics Concern   Not on file  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  Has one daughter.  Education: college.      1 dog   Social Determinants of Corporate investment banker Strain: Not on file  Food Insecurity: Not on file  Transportation Needs: Not on file  Physical Activity: Not on file  Stress: Not on file  Social Connections: Not on file  Intimate Partner Violence: Not on file    Family History  Problem Relation Age of Onset   Diabetes Maternal Grandmother    Heart failure Maternal Grandmother    COPD Maternal Grandmother        smoker   Thyroid disease Maternal Grandmother    Vascular Disease Maternal Grandmother    Colon cancer Maternal Grandfather    Colon polyps Mother    Breast cancer Other    CAD Brother 42       stent   Esophageal cancer Neg Hx    Rectal cancer Neg Hx    Stomach cancer Neg Hx     Review of Systems:  As stated in the HPI and otherwise negative.   BP 138/72   Pulse (!) 59   Ht 5\' 4"  (1.626 m)   Wt 61.1 kg   SpO2 100%   BMI 23.14 kg/m   Physical Examination: General: Well developed, well nourished, NAD  HEENT: OP clear, mucus membranes moist  SKIN: warm, dry. No rashes. Neuro: No focal deficits  Musculoskeletal: Muscle strength 5/5 all ext  Psychiatric: Mood and affect normal  Neck: No JVD, no carotid bruits, no thyromegaly, no lymphadenopathy.  Lungs:Clear  bilaterally, no wheezes, rhonci, crackles Cardiovascular: Regular rate and rhythm. No murmurs, gallops or rubs. Abdomen:Soft. Bowel sounds present. Non-tender.  Extremities: No lower extremity edema. Pulses are 2 + in the bilateral DP/PT.  EKG:  EKG is ordered today. The ekg ordered today demonstrates  EKG Interpretation Date/Time:  Wednesday August 27 2023 08:37:23 EDT Ventricular Rate:  57 PR Interval:  148 QRS Duration:  70 QT Interval:  412 QTC Calculation: 401 R Axis:   112  Text Interpretation: Sinus bradycardia Low voltage QRS Septal infarct (cited on or before 27-Aug-2023) Lateral infarct , age undetermined Confirmed by Verne Carrow [phone]) on 08/27/2023 8:41:54 AM    Recent Labs: No results found for requested labs within last 365 days.   Lipid Panel    Component Value Date/Time   CHOL 138 08/13/2022 0744   TRIG 116.0 08/13/2022 0744   HDL 49.40 08/13/2022 0744   CHOLHDL 3 08/13/2022 0744   VLDL 23.2 08/13/2022 0744   LDLCALC 66 08/13/2022 0744   LDLDIRECT 120.0 06/17/2019 0738     Wt Readings from Last 3 Encounters:  08/27/23 61.1 kg  07/11/23 60.9 kg  01/08/23 61.8 kg    Assessment and Plan:   1. CAD s/p CABG with angina: She has stable angina but PET CT perfusion study suggests anterolateral and inferior wall perfusion defects. She is having chest pressure and dyspnea with moderate to heavy exertion. She has been intolerant of escalation of anti-anginal therapy. I have reviewed her cardiac cath films from 2021 with the patient and her husband. I think given her symptoms and the abnormal stress test findings that it is reasonable to perform a cardiac cath. Her RCA is a large dominant vessel with moderate ostial stenosis and  severe mid stenosis by cath in 2021. There was competitive flow into the posterolateral artery from a graft but I could not locate this graft on the aorta. The RCA would be a favorable vessel for stent placement if deemed to be  necessary.   I also discussed the option of ongoing medical therapy but she would rather proceed with cardiac cath to define the extent of her CAD.   I will arrange a cardiac cath at [facility] on 09/12/23 at 9am.   I have reviewed the risks, indications, and alternatives to cardiac catheterization, possible angioplasty, and stenting with the patient. Risks include but are not limited to bleeding, infection, vascular injury, stroke, myocardial infection, arrhythmia, kidney injury, radiation-related injury in the case of prolonged fluoroscopy use, emergency cardiac surgery, and death. The patient understands the risks of serious complication is 1-2 in 1000 with diagnostic cardiac cath and 1-2% or less with angioplasty/stenting.   Labs/ tests ordered today include:   Orders Placed This Encounter  Procedures   Basic Metabolic Panel (BMET)   CBC   EKG 12-Lead    Disposition:   F/U with Dr. Royann Shivers following her cath.     Signed, Verne Carrow, MD, [facility] 08/27/2023 8:56 AM    [facility] [address] [place], [place], [place]  [address] Phone: [phone]; Fax: [phone]

## 2023-08-28 LAB — BASIC METABOLIC PANEL
BUN/Creatinine Ratio: 14 (ref 12–28)
BUN: 15 mg/dL (ref 8–27)
CO2: 26 mmol/L (ref 20–29)
Calcium: 9.7 mg/dL (ref 8.7–10.3)
Chloride: 103 mmol/L (ref 96–106)
Creatinine, Ser: 1.09 mg/dL — ABNORMAL HIGH (ref 0.57–1.00)
Glucose: 74 mg/dL (ref 70–99)
Potassium: 4.5 mmol/L (ref 3.5–5.2)
Sodium: 143 mmol/L (ref 134–144)
eGFR: 58 mL/min/{1.73_m2} — ABNORMAL LOW (ref >59)

## 2023-08-28 LAB — CBC
Hematocrit: 42.7 % (ref 34.0–46.6)
Hemoglobin: 13.9 g/dL (ref 11.1–15.9)
MCH: 30 pg (ref 26.6–33.0)
MCHC: 32.6 g/dL (ref 31.5–35.7)
MCV: 92 fL (ref 79–97)
Platelets: 267 10*3/uL (ref 150–450)
RBC: 4.63 x10E6/uL (ref 3.77–5.28)
RDW: 13 % (ref 11.7–15.4)
WBC: 4.8 10*3/uL (ref 3.4–10.8)

## 2023-08-31 ENCOUNTER — Encounter: Payer: Self-pay | Admitting: Family Medicine

## 2023-08-31 DIAGNOSIS — M810 Age-related osteoporosis without current pathological fracture: Secondary | ICD-10-CM

## 2023-08-31 DIAGNOSIS — E1059 Type 1 diabetes mellitus with other circulatory complications: Secondary | ICD-10-CM

## 2023-08-31 DIAGNOSIS — E785 Hyperlipidemia, unspecified: Secondary | ICD-10-CM

## 2023-08-31 DIAGNOSIS — E1069 Type 1 diabetes mellitus with other specified complication: Secondary | ICD-10-CM

## 2023-09-03 ENCOUNTER — Other Ambulatory Visit: Payer: Self-pay | Admitting: Cardiovascular Disease

## 2023-09-05 ENCOUNTER — Other Ambulatory Visit: Payer: Managed Care, Other (non HMO)

## 2023-09-09 NOTE — Progress Notes (Addendum)
Folashade Gamboa T. Deashia Soule, MD, CAQ Sports Medicine [facility] [address] [place], [address]  Phone: [phone]  FAX: [phone]  Felicia [name] - 62 y.o. female  MRN [id number]  Date of Birth: 1961/05/10  Date: 09/10/2023  PCP: Felicia Boyden, MD  Referral: Felicia Boyden, MD  Chief Complaint  Patient presents with   Ankle Pain    Left-From ankle to top of foot since last Friday    Subjective:   Felicia [name] is a 62 y.o. very pleasant female patient with Body mass index is 24.55 kg/m. who presents with the following:  She is a very pleasant lady who presents with some ongoing ankle pain.  She is a new patient to me today.  Patient presents with 5 days of ongoing pain and mild pink/redness on the dorsum of the foot.  She did not have any kind of specific injury and she awoke on Friday with some ongoing significant pain in the midfoot as well as the ankle.  It does hurt when she tries to walk.  She has put a compression bandage on this, this may or may not have really helped all that much.  Dorum of the foot, will hurt to walk on it.  Put the other, hurt with pushing it down.  She has a mild limp right now. Has tried to soak it in epsom salts  No prior significant foot or ankle pathology, fractures, or operative intervention. To her knowledge, she has never had gout or CPPD before.  Bandage Did not hit it at all Husbands truck is really high  Gout - Cath on Friday  [name] - having a cath on Friday - gout or CPPD?  Review of Systems is noted in the HPI, as appropriate  Objective:   BP (!) 150/70 (BP Location: Left Arm, Patient Position: Sitting, Cuff Size: Normal)   Pulse 60   Temp 98.2 F (36.8 C) (Temporal)   Ht 5' 2.5" (1.588 m)   Wt 136 lb 6 oz (61.9 kg)   SpO2 99%   BMI 24.55 kg/m   GEN: No acute distress; alert,appropriate. PULM: Breathing comfortably in no respiratory distress PSYCH: Normally  interactive.   The entirety of the right foot and ankle is normal and nontender  Left foot and ankle exam: No tenderness along the tibia or fibula.  There is no bruising. True ankle joint is tender to palpation, there is some mild swelling at the ankle. ATFL, CFL, Achilles tendon, plantar fascia, calf, as well as all phalanges and MTP joints are essentially unremarkable.  She does have some mild swelling, redness and notable tenderness to palpation additionally in the midfoot.  It is essentially global midfoot tenderness.  Laboratory and Imaging Data:  Assessment and Plan:     ICD-10-CM   1. Acute idiopathic gout of left foot  M10.072     2. Acute left ankle pain  M25.572      History and exam is most consistent with gout versus CPPD.  Quite tender, she awoke with pain without any kind of injury or inciting event.  Typically, I would treat this with either combination of indomethacin and colchicine versus potentially prednisone.  She has a cardiac catheterization scheduled for Friday.  She does have worsening shortness of breath with activity and walking up a flight of stairs.  In preparation for this, we will not do any kind of pharmacological intervention.  Postcardiac catheterization, it may be appropriate to  treat her foot and ankle.  I am going to ask her cardiologist what would be appropriate after catheterization.  With intervention and possible PCI, certainly avoid prednisone, probably NSAIDs if antiplatelets are used.  Colchicine may be more appropriate.  Addendum: 09/15/23 9:38 AM  Discussed with Cardiology, and they agree that Colchicine is reasonable post-cath.  Medication Management during today's office visit: Meds ordered this encounter  Medications   colchicine 0.6 MG tablet    Sig: Take 1 tablet (0.6 mg total) by mouth 2 (two) times daily.    Dispense:  60 tablet    Refill:  2   Medications Discontinued During This Encounter  Medication Reason   tolterodine  (DETROL LA) 2 MG 24 hr capsule Completed Course   NOVOLIN R 100 UNIT/ML injection Duplicate    Orders placed today for conditions managed today: No orders of the defined types were placed in this encounter.   Disposition: No follow-ups on file.  Dragon [employer] One speech-to-text software was used for transcription in this dictation.  Possible transcriptional errors can occur using Animal nutritionist.   Signed,  Elpidio Galea. Haruna Rohlfs, MD   Outpatient Encounter Medications as of 09/10/2023  Medication Sig   aspirin 81 MG EC tablet Take 1 tablet (81 mg total) by mouth daily.   CALCIUM PO Take 2 tablets by mouth daily.   colchicine 0.6 MG tablet Take 1 tablet (0.6 mg total) by mouth 2 (two) times daily.   Continuous Glucose Sensor (DEXCOM G7 SENSOR) MISC CHANGE SENSOR EVERY 10 DAYS   esomeprazole (NEXIUM) 20 MG capsule Take 40 mg by mouth daily.   Evolocumab (REPATHA SURECLICK) 140 MG/ML SOAJ Inject 140 mg into the skin every 14 (fourteen) days.   Glucagon 3 MG/DOSE POWD Place 3 mg into the nose once as needed for up to 1 dose.   insulin NPH Human (HUMULIN N) 100 UNIT/ML injection Inject 0.08 mLs (8 Units total) into the skin at bedtime.   insulin regular (HUMULIN R) 100 units/mL injection Inject 1-6 Units into the skin 3 (three) times daily before meals. As instructed per sliding scale   metoprolol succinate (TOPROL-XL) 25 MG 24 hr tablet TAKE 1 TABLET (25 MG TOTAL) BY MOUTH DAILY.   Multiple Vitamin (MULTIVITAMIN) tablet Take 1 tablet by mouth daily.   nitroGLYCERIN (NITROSTAT) 0.4 MG SL tablet Place 1 tablet (0.4 mg total) under the tongue every 5 (five) minutes as needed for chest pain.   Omega-3 Fatty Acids (FISH OIL) 500 MG CAPS Take 500 mg by mouth daily.   TECHLITE INSULIN SYRINGE 31G X 5/16" 0.3 ML MISC USE UP TO 5 TIMES DAILY TO INJECT INSULIN DAILY   [DISCONTINUED] NOVOLIN R 100 UNIT/ML injection INJECT 6 UNITS TOTAL INTO THE SKIN 3 (THREE) TIMES DAILY BEFORE MEALS. (Patient taking  differently: Inject 1-6 Units into the skin 3 (three) times daily before meals.)   [DISCONTINUED] tolterodine (DETROL LA) 2 MG 24 hr capsule Take 2 mg by mouth daily.   No facility-administered encounter medications on file as of 09/10/2023.

## 2023-09-10 ENCOUNTER — Ambulatory Visit (INDEPENDENT_AMBULATORY_CARE_PROVIDER_SITE_OTHER): Payer: Managed Care, Other (non HMO) | Admitting: Family Medicine

## 2023-09-10 ENCOUNTER — Encounter: Payer: Self-pay | Admitting: Family Medicine

## 2023-09-10 VITALS — BP 150/70 | HR 60 | Temp 98.2°F | Ht 62.5 in | Wt 136.4 lb

## 2023-09-10 DIAGNOSIS — M25572 Pain in left ankle and joints of left foot: Secondary | ICD-10-CM | POA: Diagnosis not present

## 2023-09-10 DIAGNOSIS — M10072 Idiopathic gout, left ankle and foot: Secondary | ICD-10-CM | POA: Diagnosis not present

## 2023-09-11 ENCOUNTER — Telehealth: Payer: Self-pay | Admitting: *Deleted

## 2023-09-11 NOTE — Telephone Encounter (Signed)
Cardiac Catheterization scheduled at [facility] for: Friday September 12, 2023 9 AM Arrival time [facility] Main Entrance A at: 7 AM  Nothing to eat after midnight prior to procedure, clear liquids until 5 AM day of procedure.  Medication instructions: -Insulin-pt will adjust  -Other usual morning medications can be taken with sips of water including aspirin 81 mg.  Plan to go home the same day, you will only stay overnight if medically necessary.  You must have responsible adult to drive you home.  Someone must be with you the first 24 hours after you arrive home.  Reviewed procedure instructions with patient.

## 2023-09-12 ENCOUNTER — Ambulatory Visit (HOSPITAL_COMMUNITY)
Admission: RE | Disposition: A | Discharge status: Home / Self Care | Payer: Self-pay | Source: Home / Self Care | Attending: Cardiovascular Disease

## 2023-09-12 ENCOUNTER — Other Ambulatory Visit: Payer: Self-pay

## 2023-09-12 ENCOUNTER — Ambulatory Visit (HOSPITAL_COMMUNITY)
Admission: RE | Admit: 2023-09-12 | Discharge: 2023-09-12 | Disposition: A | Discharge status: Home / Self Care | Payer: Managed Care, Other (non HMO) | Attending: Cardiovascular Disease | Admitting: Cardiovascular Disease

## 2023-09-12 DIAGNOSIS — R0609 Other forms of dyspnea: Secondary | ICD-10-CM | POA: Insufficient documentation

## 2023-09-12 DIAGNOSIS — Z951 Presence of aortocoronary bypass graft: Secondary | ICD-10-CM | POA: Diagnosis not present

## 2023-09-12 DIAGNOSIS — E785 Hyperlipidemia, unspecified: Secondary | ICD-10-CM | POA: Insufficient documentation

## 2023-09-12 DIAGNOSIS — I2584 Coronary atherosclerosis due to calcified coronary lesion: Secondary | ICD-10-CM | POA: Diagnosis not present

## 2023-09-12 DIAGNOSIS — I25708 Atherosclerosis of coronary artery bypass graft(s), unspecified, with other forms of angina pectoris: Secondary | ICD-10-CM

## 2023-09-12 DIAGNOSIS — Z794 Long term (current) use of insulin: Secondary | ICD-10-CM | POA: Insufficient documentation

## 2023-09-12 DIAGNOSIS — E109 Type 1 diabetes mellitus without complications: Secondary | ICD-10-CM | POA: Insufficient documentation

## 2023-09-12 DIAGNOSIS — I25118 Atherosclerotic heart disease of native coronary artery with other forms of angina pectoris: Secondary | ICD-10-CM | POA: Diagnosis present

## 2023-09-12 DIAGNOSIS — Z955 Presence of coronary angioplasty implant and graft: Secondary | ICD-10-CM | POA: Insufficient documentation

## 2023-09-12 HISTORY — PX: LEFT HEART CATH AND CORS/GRAFTS ANGIOGRAPHY: CATH118250

## 2023-09-12 HISTORY — PX: CORONARY PRESSURE/FFR STUDY: CATH118243

## 2023-09-12 LAB — GLUCOSE, CAPILLARY
Glucose-Capillary: 174 mg/dL — ABNORMAL HIGH (ref 70–99)
Glucose-Capillary: 174 mg/dL — ABNORMAL HIGH (ref 70–99)
Glucose-Capillary: 209 mg/dL — ABNORMAL HIGH (ref 70–99)
Glucose-Capillary: 256 mg/dL — ABNORMAL HIGH (ref 70–99)

## 2023-09-12 LAB — POCT ACTIVATED CLOTTING TIME
Activated Clotting Time: 343 s
Activated Clotting Time: 193 s
Activated Clotting Time: 175 s

## 2023-09-12 SURGERY — LEFT HEART CATH AND CORS/GRAFTS ANGIOGRAPHY
Anesthesia: LOCAL

## 2023-09-12 MED ORDER — MIDAZOLAM HCL 2 MG/2ML IJ SOLN
INTRAMUSCULAR | Status: DC | PRN
  Administered 2023-09-12 (×2): 1 mg via INTRAVENOUS

## 2023-09-12 MED ORDER — HEPARIN SODIUM (PORCINE) 1000 UNIT/ML IJ SOLN
INTRAMUSCULAR | Status: AC
Start: 2023-09-12 — End: ?
  Filled 2023-09-12: qty 10

## 2023-09-12 MED ORDER — SODIUM CHLORIDE 0.9% FLUSH: 3.0000 mL | Freq: Two times a day (BID) | INTRAVENOUS | Status: DC

## 2023-09-12 MED ORDER — ACETAMINOPHEN 325 MG PO TABS: 650.0000 mg | ORAL_TABLET | ORAL | Status: DC | PRN

## 2023-09-12 MED ORDER — HEPARIN (PORCINE) IN NACL 1000-0.9 UT/500ML-% IV SOLN
INTRAVENOUS | Status: DC | PRN
  Administered 2023-09-12 (×2): 500 mL

## 2023-09-12 MED ORDER — LIDOCAINE HCL (PF) 1 % IJ SOLN
INTRAMUSCULAR | Status: AC
  Filled 2023-09-12: qty 30

## 2023-09-12 MED ORDER — ONDANSETRON HCL 4 MG/2ML IJ SOLN: 4.0000 mg | Freq: Four times a day (QID) | INTRAMUSCULAR | Status: DC | PRN

## 2023-09-12 MED ORDER — SODIUM CHLORIDE 0.9 % WEIGHT BASED INFUSION: 1.0000 mL/kg/h | INTRAVENOUS | Status: DC

## 2023-09-12 MED ORDER — SODIUM CHLORIDE 0.9 % IV SOLN: INTRAVENOUS | Status: AC

## 2023-09-12 MED ORDER — IOHEXOL 350 MG/ML SOLN
INTRAVENOUS | Status: DC | PRN
  Administered 2023-09-12: 50 mL

## 2023-09-12 MED ORDER — HYDRALAZINE HCL 20 MG/ML IJ SOLN: 10.0000 mg | INTRAMUSCULAR | Status: AC | PRN

## 2023-09-12 MED ORDER — FENTANYL CITRATE (PF) 100 MCG/2ML IJ SOLN
INTRAMUSCULAR | Status: DC | PRN
  Administered 2023-09-12 (×2): 25 ug via INTRAVENOUS

## 2023-09-12 MED ORDER — MIDAZOLAM HCL 2 MG/2ML IJ SOLN
INTRAMUSCULAR | Status: AC
  Filled 2023-09-12: qty 2

## 2023-09-12 MED ORDER — INSULIN ASPART 100 UNIT/ML IJ SOLN
2.0000 [IU] | Freq: Once | INTRAMUSCULAR | Status: AC
  Administered 2023-09-12: 2 [IU] via SUBCUTANEOUS

## 2023-09-12 MED ORDER — FENTANYL CITRATE (PF) 100 MCG/2ML IJ SOLN
INTRAMUSCULAR | Status: AC
  Filled 2023-09-12: qty 2

## 2023-09-12 MED ORDER — SODIUM CHLORIDE 0.9 % IV SOLN
250.0000 mL | INTRAVENOUS | Status: DC | PRN
Start: 2023-09-12 — End: 2023-09-13

## 2023-09-12 MED ORDER — SODIUM CHLORIDE 0.9% FLUSH: 3.0000 mL | INTRAVENOUS | Status: DC | PRN

## 2023-09-12 MED ORDER — HEPARIN SODIUM (PORCINE) 1000 UNIT/ML IJ SOLN
INTRAMUSCULAR | Status: DC | PRN
  Administered 2023-09-12: 8000 [IU] via INTRAVENOUS

## 2023-09-12 MED ORDER — LABETALOL HCL 5 MG/ML IV SOLN
10.0000 mg | INTRAVENOUS | Status: AC | PRN
Start: 2023-09-12 — End: 2023-09-12

## 2023-09-12 MED ORDER — ASPIRIN 81 MG PO CHEW: 81.0000 mg | CHEWABLE_TABLET | ORAL | Status: DC

## 2023-09-12 MED ORDER — SODIUM CHLORIDE 0.9 % WEIGHT BASED INFUSION: 3.0000 mL/kg/h | INTRAVENOUS | Status: AC

## 2023-09-12 SURGICAL SUPPLY — 12 items
CATH INFINITI 5FR MULTPACK ANG (CATHETERS) IMPLANT
CATH VISTA GUIDE 6FR JR4 SH (CATHETERS) IMPLANT
CATHETER LAUNCHER 6FR JR4 SH (CATHETERS) IMPLANT
GUIDEWIRE PRESSURE X 175 (WIRE) IMPLANT
KIT ESSENTIALS PG (KITS) IMPLANT
KIT MICROPUNCTURE NIT STIFF (SHEATH) IMPLANT
PACK CARDIAC CATHETERIZATION (CUSTOM PROCEDURE TRAY) ×2 IMPLANT
SET ATX-X65L (MISCELLANEOUS) IMPLANT
SHEATH PINNACLE 5F 10CM (SHEATH) IMPLANT
SHEATH PINNACLE 6F 10CM (SHEATH) IMPLANT
SHEATH PROBE COVER 6X72 (BAG) IMPLANT
WIRE EMERALD 3MM-J .035X150CM (WIRE) IMPLANT

## 2023-09-12 NOTE — Discharge Instructions (Signed)
Femoral Site Care This sheet gives you information about how to care for yourself after your procedure. Your health care provider may also give you more specific instructions. If you have problems or questions, contact your health care provider. What can I expect after the procedure?  After the procedure, it is common to have: Bruising that usually fades within 1-2 weeks. Tenderness at the site. Follow these instructions at home: Wound care Follow instructions from your health care provider about how to take care of your insertion site. Make sure you: Wash your hands with soap and water before you change your bandage (dressing). If soap and water are not available, use hand sanitizer. Remove your dressing as told by your health care provider. In 24 hours Do not take baths, swim, or use a hot tub until your health care provider approves. You may shower 24-48 hours after the procedure or as told by your health care provider. Gently wash the site with plain soap and water. Pat the area dry with a clean towel. Do not rub the site. This may cause bleeding. Do not apply powder or lotion to the site. Keep the site clean and dry. Check your femoral site every day for signs of infection. Check for: Redness, swelling, or pain. Fluid or blood. Warmth. Pus or a bad smell. Activity For the first 2-3 days after your procedure, or as long as directed: Avoid climbing stairs as much as possible. Do not squat. Do not lift anything that is heavier than 10 lb (4.5 kg), or the limit that you are told, until your health care provider says that it is safe. For 5 days Rest as directed. Avoid sitting for a long time without moving. Get up to take short walks every 1-2 hours. Do not drive for 24 hours if you were given a medicine to help you relax (sedative). General instructions Take over-the-counter and prescription medicines only as told by your health care provider. Keep all follow-up visits as told by  your health care provider. This is important. Contact a health care provider if you have: A fever or chills. You have redness, swelling, or pain around your insertion site. Get help right away if: The catheter insertion area swells very fast. You pass out. You suddenly start to sweat or your skin gets clammy. The catheter insertion area is bleeding, and the bleeding does not stop when you hold steady pressure on the area. The area near or just beyond the catheter insertion site becomes pale, cool, tingly, or numb. These symptoms may represent a serious problem that is an emergency. Do not wait to see if the symptoms will go away. Get medical help right away. Call your local emergency services (911 in the [place] Do not drive yourself to the hospital. Summary After the procedure, it is common to have bruising that usually fades within 1-2 weeks. Check your femoral site every day for signs of infection. Do not lift anything that is heavier than 10 lb (4.5 kg), or the limit that you are told, until your health care provider says that it is safe. This information is not intended to replace advice given to you by your health care provider. Make sure you discuss any questions you have with your health care provider. Document Revised: 11/03/2017 Document Reviewed: 11/03/2017 Elsevier Patient Education  2020 [employer]Femoral Site Care This sheet gives you information about how to care for yourself after your procedure. Your health care provider may also give you more specific instructions. If  you have problems or questions, contact your health care provider. What can I expect after the procedure?  After the procedure, it is common to have: Bruising that usually fades within 1-2 weeks. Tenderness at the site. Follow these instructions at home: Wound care Follow instructions from your health care provider about how to take care of your insertion site. Make sure you: Wash your hands with soap and  water before you change your bandage (dressing). If soap and water are not available, use hand sanitizer. Remove your dressing as told by your health care provider. In 24 hours Do not take baths, swim, or use a hot tub until your health care provider approves. You may shower 24-48 hours after the procedure or as told by your health care provider. Gently wash the site with plain soap and water. Pat the area dry with a clean towel. Do not rub the site. This may cause bleeding. Do not apply powder or lotion to the site. Keep the site clean and dry. Check your femoral site every day for signs of infection. Check for: Redness, swelling, or pain. Fluid or blood. Warmth. Pus or a bad smell. Activity For the first 2-3 days after your procedure, or as long as directed: Avoid climbing stairs as much as possible. Do not squat. Do not lift anything that is heavier than 10 lb (4.5 kg), or the limit that you are told, until your health care provider says that it is safe. For 5 days Rest as directed. Avoid sitting for a long time without moving. Get up to take short walks every 1-2 hours. Do not drive for 24 hours if you were given a medicine to help you relax (sedative). General instructions Take over-the-counter and prescription medicines only as told by your health care provider. Keep all follow-up visits as told by your health care provider. This is important. Contact a health care provider if you have: A fever or chills. You have redness, swelling, or pain around your insertion site. Get help right away if: The catheter insertion area swells very fast. You pass out. You suddenly start to sweat or your skin gets clammy. The catheter insertion area is bleeding, and the bleeding does not stop when you hold steady pressure on the area. The area near or just beyond the catheter insertion site becomes pale, cool, tingly, or numb. These symptoms may represent a serious problem that is an emergency.  Do not wait to see if the symptoms will go away. Get medical help right away. Call your local emergency services (911 in the [place] Do not drive yourself to the hospital. Summary After the procedure, it is common to have bruising that usually fades within 1-2 weeks. Check your femoral site every day for signs of infection. Do not lift anything that is heavier than 10 lb (4.5 kg), or the limit that you are told, until your health care provider says that it is safe. This information is not intended to replace advice given to you by your health care provider. Make sure you discuss any questions you have with your health care provider. Document Revised: 11/03/2017 Document Reviewed: 11/03/2017 Elsevier Patient Education  2020 [employer].

## 2023-09-12 NOTE — Interval H&P Note (Signed)
History and Physical Interval Note:  09/12/2023 7:55 AM  Felicia [name]  has presented today for surgery, with the diagnosis of cad.  The various methods of treatment have been discussed with the patient and family. After consideration of risks, benefits and other options for treatment, the patient has consented to  Procedure(s): LEFT HEART CATH AND CORS/GRAFTS ANGIOGRAPHY (N/A) as a surgical intervention.  The patient's history has been reviewed, patient examined, no change in status, stable for surgery.  I have reviewed the patient's chart and labs.  Questions were answered to the patient's satisfaction.    Cath Lab Visit (complete for each Cath Lab visit)  Clinical Evaluation Leading to the Procedure:   ACS: No.  Non-ACS:    Anginal Classification: CCS III  Anti-ischemic medical therapy: Minimal Therapy (1 class of medications)  Non-Invasive Test Results: Intermediate-risk stress test findings: cardiac mortality 1-3%/year  Prior CABG: Previous CABG        Verne Carrow

## 2023-09-12 NOTE — Progress Notes (Addendum)
Pt self-manages blood glucose with continuous glucose sensor (Dexcom). BG 219 at this time per monitor.

## 2023-09-12 NOTE — Progress Notes (Signed)
18:45- Patient up to ambulate. Patient became flushed, lightheaded, dizzy with vision changes/tunnel vision upon standing. Returned patient to lying position c/o sharp pain to sinus region of her head.  Rapid Response called  and Perlie Gold, PA

## 2023-09-12 NOTE — Progress Notes (Signed)
   Patient evaluated at the request of nursing staff after she became lightheaded/dizzy with vision changes/tunnel vision upon standing. I manually check orthostatic vital signs and found patient to have supine BP 154/62, sitting BP 140/62, standing BP 82/48. Groin access site well appearing with no bleeding, swelling, hematoma. Strong distal pulses. Will request nursing staff to run remaining ~420mL of saline in bag. Patient also with minimal food intake over the last 18 hours due to NPO status. Will also have her eat dinner. I've requested that overnight cardiology provider re-evaluate patient. If she improves, would be reasonable to continue with plans to d/c.   Perlie Gold, PA-C

## 2023-09-12 NOTE — Progress Notes (Signed)
Oozing noted from right groin. No hematoma present but patient states her bladder is full and she is unable to void. Dressing removed with no visible oozing noted. Dressing to right groin changed. 67fr straight in and out catheter used to empty bladder per MD orders. 800 cc urine emptied. Patient expressed relief and Systolic BP now 150's; down from 170's. ACT 193. Will continue to monitor per orders.Felicia [name]

## 2023-09-12 NOTE — Progress Notes (Signed)
Site area: R groin Site Prior to Removal:  Level 1 (oozing at site) Pressure Applied For: 30 min (held for addition due to continued oozing) Manual:   yes Patient Status During Pull:  stable Post Pull Site:  Level 0 Post Pull Instructions Given:  yes Post Pull Pulses Present: 3+ R DP  Dressing Applied:  gauze & tegaderm Bedrest begins @ 1445 Comments:

## 2023-09-12 NOTE — Progress Notes (Signed)
Spoke with patient regarding her diabetic and insulin management. Patient stated that she uses a dexcom sensor to monitor her glucose level. She uses an insulin sliding scale at home. Her Dexcom is displaying 220s. She is insisting on managing her glucose using her personal vial of Humulin R from home. Explained to patient Felicia [name] on administering medications from home. Patient agreed to allow me to check her glucose using the hospital glucometer which resulted a CBG of 174. Patient declined insulin coverage at this time based on this reading. She stated she did not want her glucose to further drop because she is currently NPO. She has agreed to allow staff to manage her glucose if needed.Felicia [name]

## 2023-09-12 NOTE — Significant Event (Signed)
Rapid Response Event Note   Reason for Call :  HA/vision changes s/p cardiac cath  Per RN, while standing at bedside, pt became very pale. SBP was 60s. Pt was placed back in bed and SBP increased to 140s. A few minutes after BP normalized, pt began to c/o HA and vision changes in both eyes described by patient as "like a kaleidoscope."  RRT was called for assistance. Pt assessed by RRT at 1907.  Initial Focused Assessment:  Pt lying in bed with eyes open, in no visible distress. She says her vision is almost back to normal.  Pupils 5, equal, and brisk. Pt is neuro intact, NIH-0. R groin site WNL-level 0. Pt's glucose per her dexcom meter remained stable t/o(this was correlated to [facility] CBG meters).  During assessment, pt states her  vision is completely back to normal.   HR-60s, BP-116/68, RR-14, SpO2-98% on RA.   Orthostatic VS's done. Lying: BP-116/68, HR-66 Sitting: BP-113/50, HR-63, no vision changes.  Standing: BP-82/51, HR-68-Pt began to feel hot/nauseous and complained that her head did not "feel right."  Per pt, vision remained normal. Sitting: BP-71/47, HR-66 Lying: BP-149/59, HR-61-symptoms resolved  Pt was given approx 150cc of NS by bedside RN at this time.  Clayburn Pert, PA to bedside. Orthostatic VS repeated with manual BPs  Lying: BP-155/62, HR-64 Sitting: BP-140/62, HR-64 Standing: BP-82/48, HR-68-pt became warm. Lying: BP-126/57, HR-61  See plan below.   Interventions:  Fluid/snack-per order by PA Evan  Plan of Care:  Pt to get rest of NS bag of fluid and eat something. Orthostatic VS to be repeated once these are done. Please call RRT if further assistance needed.   Event Summary:   MD Notified: Clayburn Pert, Cards PA notified and came to bedside.   Call Time:1902 Arrival Time:1907 End Time:2025  Terrilyn Saver, RN

## 2023-09-15 ENCOUNTER — Encounter (HOSPITAL_COMMUNITY): Payer: Self-pay | Admitting: Cardiovascular Disease

## 2023-09-15 ENCOUNTER — Telehealth: Payer: Self-pay | Admitting: Family Medicine

## 2023-09-15 MED ORDER — COLCHICINE 0.6 MG PO TABS: 0.6000 mg | ORAL_TABLET | Freq: Two times a day (BID) | ORAL | 2 refills | Status: DC

## 2023-09-15 NOTE — Telephone Encounter (Signed)
-----   Message from Verne Carrow sent at 09/11/2023 11:30 AM EST ----- Regarding: RE: post-cath gout treatment [name]. Colchicine would be fine with me. Thanks, chris ----- Message ----- From: [name], MD Sent: 09/11/2023  11:23 AM EST To: [name], MD Subject: post-cath gout treatment                       [name],   I saw Felicia [name] yesterday, and I think she probably has gout or CPPD in her foot.  She has a heart cath on Friday with you, and I definitely did not want to give her anything that could interfere.   A few days after her cath, would it be ok to give her colchicine?  I usually use NSAIDS or prednisone, too, but I did not want to use them if she has PCI.   Do you think that would be appropriate?  Thanks! [name]

## 2023-09-15 NOTE — Telephone Encounter (Signed)
Concetta notified as instructed by telephone.  Patient states understanding. 

## 2023-09-15 NOTE — Telephone Encounter (Signed)
Can you let Felicia [name] know that I sent in some Colchicine for her.  Discussed with Dr. Sanjuana Kava, and thinks that is ok after her cardiac cath.

## 2023-09-15 NOTE — Addendum Note (Signed)
Addended by: [name] on: 09/15/2023 09:38 AM   Modules accepted: Orders

## 2023-09-17 ENCOUNTER — Other Ambulatory Visit: Payer: Managed Care, Other (non HMO)

## 2023-09-17 MED FILL — Lidocaine HCl Local Preservative Free (PF) Inj 1%: INTRAMUSCULAR | Qty: 30 | Status: AC

## 2023-09-22 ENCOUNTER — Encounter: Payer: Self-pay | Admitting: Internal Medicine

## 2023-09-23 ENCOUNTER — Other Ambulatory Visit: Payer: Self-pay | Admitting: Internal Medicine

## 2023-09-24 ENCOUNTER — Telehealth: Payer: Self-pay

## 2023-09-24 ENCOUNTER — Ambulatory Visit: Payer: Managed Care, Other (non HMO) | Admitting: Family Medicine

## 2023-09-24 ENCOUNTER — Encounter: Payer: Self-pay | Admitting: Family Medicine

## 2023-09-24 VITALS — BP 132/74 | HR 56 | Temp 97.8°F | Ht 62.5 in | Wt 131.4 lb

## 2023-09-24 DIAGNOSIS — E1069 Type 1 diabetes mellitus with other specified complication: Secondary | ICD-10-CM

## 2023-09-24 DIAGNOSIS — R300 Dysuria: Secondary | ICD-10-CM

## 2023-09-24 DIAGNOSIS — E1059 Type 1 diabetes mellitus with other circulatory complications: Secondary | ICD-10-CM

## 2023-09-24 DIAGNOSIS — N3941 Urge incontinence: Secondary | ICD-10-CM

## 2023-09-24 DIAGNOSIS — K3184 Gastroparesis: Secondary | ICD-10-CM

## 2023-09-24 DIAGNOSIS — M810 Age-related osteoporosis without current pathological fracture: Secondary | ICD-10-CM

## 2023-09-24 DIAGNOSIS — N390 Urinary tract infection, site not specified: Secondary | ICD-10-CM | POA: Insufficient documentation

## 2023-09-24 DIAGNOSIS — N3001 Acute cystitis with hematuria: Secondary | ICD-10-CM

## 2023-09-24 DIAGNOSIS — Z0000 Encounter for general adult medical examination without abnormal findings: Secondary | ICD-10-CM

## 2023-09-24 DIAGNOSIS — Z951 Presence of aortocoronary bypass graft: Secondary | ICD-10-CM

## 2023-09-24 DIAGNOSIS — K2100 Gastro-esophageal reflux disease with esophagitis, without bleeding: Secondary | ICD-10-CM

## 2023-09-24 DIAGNOSIS — E785 Hyperlipidemia, unspecified: Secondary | ICD-10-CM | POA: Diagnosis not present

## 2023-09-24 DIAGNOSIS — T466X5A Adverse effect of antihyperlipidemic and antiarteriosclerotic drugs, initial encounter: Secondary | ICD-10-CM

## 2023-09-24 DIAGNOSIS — I25708 Atherosclerosis of coronary artery bypass graft(s), unspecified, with other forms of angina pectoris: Secondary | ICD-10-CM

## 2023-09-24 DIAGNOSIS — E1043 Type 1 diabetes mellitus with diabetic autonomic (poly)neuropathy: Secondary | ICD-10-CM

## 2023-09-24 DIAGNOSIS — E10319 Type 1 diabetes mellitus with unspecified diabetic retinopathy without macular edema: Secondary | ICD-10-CM

## 2023-09-24 LAB — POC URINALSYSI DIPSTICK (AUTOMATED)
Bilirubin, UA: NEGATIVE
Glucose, UA: NEGATIVE
Ketones, UA: NEGATIVE
Nitrite, UA: NEGATIVE
Protein, UA: POSITIVE — AB
Spec Grav, UA: 1.015 (ref 1.010–1.025)
Urobilinogen, UA: 0.2 U/dL
pH, UA: 5.5 (ref 5.0–8.0)

## 2023-09-24 LAB — LIPID PANEL
Cholesterol: 133 mg/dL (ref 0–200)
HDL: 47.1 mg/dL (ref >39.00)
LDL Cholesterol: 61 mg/dL (ref 0–99)
NonHDL: 85.57
Total CHOL/HDL Ratio: 3
Triglycerides: 125 mg/dL (ref 0.0–149.0)
VLDL: 25 mg/dL (ref 0.0–40.0)

## 2023-09-24 LAB — BASIC METABOLIC PANEL
BUN: 25 mg/dL — ABNORMAL HIGH (ref 6–23)
CO2: 32 meq/L (ref 19–32)
Calcium: 9.8 mg/dL (ref 8.4–10.5)
Chloride: 100 meq/L (ref 96–112)
Creatinine, Ser: 1.19 mg/dL (ref 0.40–1.20)
GFR: 49.67 mL/min — ABNORMAL LOW (ref >60.00)
Glucose, Bld: 139 mg/dL — ABNORMAL HIGH (ref 70–99)
Potassium: 4.5 meq/L (ref 3.5–5.1)
Sodium: 138 meq/L (ref 135–145)

## 2023-09-24 LAB — HEPATIC FUNCTION PANEL
ALT: 12 U/L (ref 0–35)
AST: 18 U/L (ref 0–37)
Albumin: 4.1 g/dL (ref 3.5–5.2)
Alkaline Phosphatase: 68 U/L (ref 39–117)
Bilirubin, Direct: 0.1 mg/dL (ref 0.0–0.3)
Total Bilirubin: 0.4 mg/dL (ref 0.2–1.2)
Total Protein: 6.8 g/dL (ref 6.0–8.3)

## 2023-09-24 LAB — TSH: TSH: 2.85 u[IU]/mL (ref 0.35–5.50)

## 2023-09-24 LAB — VITAMIN D 25 HYDROXY (VIT D DEFICIENCY, FRACTURES): VITD: 60.57 ng/mL (ref 30.00–100.00)

## 2023-09-24 LAB — HM DIABETES EYE EXAM

## 2023-09-24 LAB — MICROALBUMIN / CREATININE URINE RATIO
Creatinine,U: 92.6 mg/dL
Microalb Creat Ratio: 17.3 mg/g (ref 0.0–30.0)
Microalb, Ur: 16.1 mg/dL — ABNORMAL HIGH (ref 0.0–1.9)

## 2023-09-24 LAB — HM MAMMOGRAPHY

## 2023-09-24 MED ORDER — CEPHALEXIN 500 MG PO CAPS: 500.0000 mg | ORAL_CAPSULE | Freq: Two times a day (BID) | ORAL | 0 refills | Status: DC

## 2023-09-24 MED ORDER — CALCIUM CARBONATE 600 MG PO TABS: 600.0000 mg | ORAL_TABLET | Freq: Every day | ORAL | Status: DC

## 2023-09-24 MED ORDER — VITAMIN D3 25 MCG (1000 UT) PO CAPS: 1.0000 | ORAL_CAPSULE | Freq: Every day | ORAL | Status: DC

## 2023-09-24 NOTE — Progress Notes (Signed)
Ph: [phone] Fax: [phone]   Patient ID: Felicia [name], female    DOB: 1961/01/20, 62 y.o.   MRN: [id number]  This visit was conducted in person.  BP 132/74   Pulse (!) 56   Temp 97.8 F (36.6 C) (Oral)   Ht 5' 2.5" (1.588 m)   Wt 131 lb 6 oz (59.6 kg)   SpO2 99%   BMI 23.65 kg/m    CC: CPE Subjective:   HPI: Felicia [name] is a 62 y.o. female presenting on 09/24/2023 for Annual Exam   Saw Dr Patsy Lager earlier in the month with acute gout flare, treated with colchicine. She actually never took colchicine - by the time it was started symptoms were better.   T1DM - followed by [facility]inology Elvera Lennox). She notes difficulty controlling sugars - wonders if related to developing scar tissue from all the shots she receives. Difficulty using insulin pump due to hypoglycemia. Uses Dexcom G7.  Lab Results  Component Value Date   HGBA1C 6.3 05/02/2023    CAD s/p CABG with early graft failure of all 3 vein grafts - sees Dr Juel Burrow. Continues repatha (intolerant to statins). Long acting nitrates caused headache. Recent repeat L heart catheterization showing severe 3v CAD s/p 4v CABG with 1/4 patent grafts. Severe mid-LAD stenosis - rec continued aggressive medical management.   During catheterization had 4 urine catheterization attempts. Notes ongoing clear urine as well as dysuria after this.    Preventative: COLONOSCOPY 07/2016 - WNL (Pyrtle) Mammogram with Bernie Covey, 06/2022 - upcoming appt today.  Well woman exam - with Dr Billy [facility] last seen 06/2022  Lung cancer screen - not eligible  DEXA 09/2022 - T -3.7 TRF, -2.2 spine. Unable to take oral bisphosphonate due to gastroparesis. Considering Prolia vs Reclast. Lactose intolerant - takes 3 calcium supplement daily. Gets regular weight bearing exercise. Flu shot - yearly  COVID vaccine - Pfizer 02/2020, 05/2020, no booster  Tdap 08/2011.  Pneumovax-23 2004. Prevnar-20 06/2021 Shingrix - 09/2022,  02/2023 Advanced directive discussion -  Seat belt use discussed Sunscreen use discussed. No changing moles on skin.  Non smoker  Alcohol - none  Dentist - q6 mo  Eye exam - sees Bulakowski - upcoming appt later today  Bowel - some constipation managed with probiotic Bladder - intermittent urge incontinence, not too bothersome at this time. No stress incontinence symptoms. Stopped myrbetriq 25mg  due to constipation. 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 Interim medical history since our last visit reviewed. Allergies and medications reviewed and updated. Outpatient Medications Prior to Visit  Medication Sig Dispense Refill   aspirin 81 MG EC tablet Take 1 tablet (81 mg total) by mouth daily.     Continuous Glucose Sensor (DEXCOM G7 SENSOR) MISC CHANGE SENSOR EVERY 10 DAYS 9 each 1   esomeprazole (NEXIUM) 20 MG capsule Take 40 mg by mouth daily.     Evolocumab (REPATHA SURECLICK) 140 MG/ML SOAJ Inject 140 mg into the skin every 14 (fourteen) days. 6 mL 3   Glucagon 3 MG/DOSE POWD Place 3 mg into the nose once as needed for up to 1 dose. 1 each 11   insulin NPH Human (HUMULIN N) 100 UNIT/ML injection Inject 0.08 mLs (  8 Units total) into the skin at bedtime. 10 mL 5   insulin regular (HUMULIN R) 100 units/mL injection Inject 1-6 Units into the skin 3 (three) times daily before meals. As instructed per sliding scale     metoprolol succinate (TOPROL-XL) 25 MG 24 hr tablet TAKE 1 TABLET (25 MG TOTAL) BY MOUTH DAILY. 90 tablet 0   Multiple Vitamin (MULTIVITAMIN) tablet Take 1 tablet by mouth daily.     nitroGLYCERIN (NITROSTAT) 0.4 MG SL tablet Place 1 tablet (0.4 mg total) under the tongue every 5 (five) minutes as  needed for chest pain. 25 tablet 3   Omega-3 Fatty Acids (FISH OIL) 500 MG CAPS Take 500 mg by mouth daily.     TECHLITE INSULIN SYRINGE 31G X 5/16" 0.3 ML MISC USE UP TO 5 TIMES DAILY TO INJECT INSULIN DAILY 300 each 1   CALCIUM PO Take 2 tablets by mouth daily.     colchicine 0.6 MG tablet Take 1 tablet (0.6 mg total) by mouth 2 (two) times daily. (Patient not taking: Reported on 09/24/2023) 60 tablet 2   No facility-administered medications prior to visit.     Per HPI unless specifically indicated in ROS section below Review of Systems  Constitutional:  Negative for activity change, appetite change, chills, fatigue, fever and unexpected weight change.  HENT:  Negative for hearing loss.   Eyes:  Negative for visual disturbance.  Respiratory:  Negative for cough, chest tightness, shortness of breath and wheezing.   Cardiovascular:  Negative for chest pain, palpitations and leg swelling.       Post-exercise fatigue  Gastrointestinal:  Negative for abdominal distention, abdominal pain, blood in stool, constipation, diarrhea, nausea and vomiting.  Genitourinary:  Negative for difficulty urinating and hematuria.  Musculoskeletal:  Negative for arthralgias, myalgias and neck pain.  Skin:  Negative for rash.  Neurological:  Negative for dizziness, seizures, syncope and headaches.  Hematological:  Negative for adenopathy. Does not bruise/bleed easily.  Psychiatric/Behavioral:  Negative for dysphoric mood. The patient is not nervous/anxious.     Objective:  BP 132/74   Pulse (!) 56   Temp 97.8 F (36.6 C) (Oral)   Ht 5' 2.5" (1.588 m)   Wt 131 lb 6 oz (59.6 kg)   SpO2 99%   BMI 23.65 kg/m   Wt Readings from Last 3 Encounters:  09/24/23 131 lb 6 oz (59.6 kg)  09/12/23 132 lb (59.9 kg)  09/10/23 136 lb 6 oz (61.9 kg)      Physical Exam Vitals and nursing note reviewed.  Constitutional:      Appearance: Normal appearance. She is not ill-appearing.  HENT:     Head: Normocephalic  and atraumatic.     Right Ear: Tympanic membrane, ear canal and external ear normal. There is no impacted cerumen.     Left Ear: Tympanic membrane, ear canal and external ear normal. There is no impacted cerumen.     Mouth/Throat:     Mouth: Mucous membranes are moist.     Pharynx: Oropharynx is clear. No oropharyngeal exudate or posterior oropharyngeal erythema.  Eyes:     General:        Right eye: No discharge.        Left eye: No discharge.     Extraocular Movements: Extraocular movements intact.     Conjunctiva/sclera: Conjunctivae normal.     Pupils: Pupils are equal, round, and reactive to light.  Neck:     Thyroid: No thyroid mass or thyromegaly.  Vascular: No carotid bruit.  Cardiovascular:     Rate and Rhythm: Normal rate and regular rhythm.     Pulses: Normal pulses.     Heart sounds: Normal heart sounds. No murmur heard. Pulmonary:     Effort: Pulmonary effort is normal. No respiratory distress.     Breath sounds: Normal breath sounds. No wheezing, rhonchi or rales.  Abdominal:     General: Bowel sounds are normal. There is no distension.     Palpations: Abdomen is soft. There is no mass.     Tenderness: There is no abdominal tenderness. There is no guarding or rebound.     Hernia: No hernia is present.  Musculoskeletal:     Cervical back: Normal range of motion and neck supple. No rigidity.     Right lower leg: No edema.     Left lower leg: No edema.  Lymphadenopathy:     Cervical: No cervical adenopathy.  Skin:    General: Skin is warm and dry.     Findings: No rash.  Neurological:     General: No focal deficit present.     Mental Status: She is alert. Mental status is at baseline.  Psychiatric:        Mood and Affect: Mood normal.        Behavior: Behavior normal.       Results for orders placed or performed in visit on 09/24/23  POCT Urinalysis Dipstick (Automated)  Result Value Ref Range   Color, UA yelllow    Clarity, UA cloudy    Glucose, UA  Negative Negative   Bilirubin, UA negative    Ketones, UA negative    Spec Grav, UA 1.015 1.010 - 1.025   Blood, UA 2+    pH, UA 5.5 5.0 - 8.0   Protein, UA Positive (A) Negative   Urobilinogen, UA 0.2 0.2 or 1.0 E.U./dL   Nitrite, UA negative    Leukocytes, UA Large (3+) (A) Negative    Assessment & Plan:   Problem List Items Addressed This Visit     Health maintenance examination - Primary (Chronic)    Preventative protocols reviewed and updated unless pt declined. Discussed healthy diet and lifestyle.       Type 1 diabetes mellitus with circulatory complication, with long-term current use of insulin (HCC)    Appreciate endo care.       Relevant Orders   Basic metabolic panel   Hyperlipidemia due to type 1 diabetes mellitus (HCC)    Chronic, stable. Continue repatha in statin intolerance.  The ASCVD Risk score (Arnett DK, et al., 2019) failed to calculate for the following reasons:   The patient has a prior MI or stroke diagnosis       Diabetic gastroparesis associated with type 1 diabetes mellitus (HCC)   Reflux esophagitis    Continue nexium 40mg  daily.       S/P CABG (coronary artery bypass graft)   CAD (coronary artery disease)   Urge urinary incontinence    Sees GYN. Off myrbetriq currently, planning to restart once constipation improved.      Diabetic retinopathy associated with type 1 diabetes mellitus (HCC)   Osteoporosis    Reviewed latest DEXA showing osteoporosis most severe at R total femur.  She is not good candidate for oral bisphosphonate in hx gastroparesis.  Discussed Reclast IV vs Prolia IM - she will further discuss with GYN later today.  Continue dietary calcium intake vit d replacement and regular weight bearing exercise.  Discussed limiting calcium supplementation in significant CAD hx.       Relevant Medications   Cholecalciferol (VITAMIN D3) 25 MCG (1000 UT) CAPS   calcium carbonate (CALCIUM 600) 600 MG TABS tablet   UTI (urinary tract  infection)    Ongoing mild dysuria since recent heart catheterization-she notes she had 4 attempts at bladder catheterization during this hospitalization.  Update urinalysis-markedly abnormal with large amount of white cells-will start empiric antibiotic while we await urine culture results. No systemic symptoms.  Suspect catheter associated UTI.  Rx keflex antibiotic sent to pharmacy      Relevant Medications   cephALEXin (KEFLEX) 500 MG capsule   Myalgia due to statin     Meds ordered this encounter  Medications   Cholecalciferol (VITAMIN D3) 25 MCG (1000 UT) CAPS    Sig: Take 1 capsule (1,000 Units total) by mouth daily.   calcium carbonate (CALCIUM 600) 600 MG TABS tablet    Sig: Take 1 tablet (600 mg total) by mouth daily with breakfast.   cephALEXin (KEFLEX) 500 MG capsule    Sig: Take 1 capsule (500 mg total) by mouth 2 (two) times daily.    Dispense:  14 capsule    Refill:  0    Orders Placed This Encounter  Procedures   Urine Culture   Basic metabolic panel   POCT Urinalysis Dipstick (Automated)    Patient Instructions  Labs today  Urinalysis today  Consider Reclast IV infusion for osteoporosis. Get most of your calcium from the diet.  Good to see you today  Return as needed or in 1 year for next physical.   Follow up plan: Return in about 1 year (around 09/23/2024) for annual exam, prior fasting for blood work.  [name], MD

## 2023-09-24 NOTE — Assessment & Plan Note (Addendum)
Chronic, stable. Continue repatha in statin intolerance.  The ASCVD Risk score (Arnett DK, et al., 2019) failed to calculate for the following reasons:   The patient has a prior MI or stroke diagnosis

## 2023-09-24 NOTE — Telephone Encounter (Signed)
Lvm asking pt to call back. Need to relay urine results and Dr Timoteo Expose message (Lab, Result Notes- 09/24/23).  Urine/Dr G's msg: Your in-office urinalysis was abnormal. I have sent a urine culture to the lab. In the interim, I would like you to start antibiotic cephalexin or Keflex 500 mg twice daily for 7 days.

## 2023-09-24 NOTE — Patient Instructions (Addendum)
Labs today  Urinalysis today  Consider Reclast IV infusion for osteoporosis. Get most of your calcium from the diet.  Good to see you today  Return as needed or in 1 year for next physical.

## 2023-09-24 NOTE — Assessment & Plan Note (Signed)
Continue nexium 40 mg daily.  

## 2023-09-24 NOTE — Assessment & Plan Note (Signed)
Appreciate endo care.  

## 2023-09-24 NOTE — Assessment & Plan Note (Addendum)
Reviewed latest DEXA showing osteoporosis most severe at R total femur.  She is not good candidate for oral bisphosphonate in hx gastroparesis.  Discussed Reclast IV vs Prolia IM - she will further discuss with GYN later today.  Continue dietary calcium intake vit d replacement and regular weight bearing exercise.  Discussed limiting calcium supplementation in significant CAD hx.

## 2023-09-24 NOTE — Assessment & Plan Note (Signed)
Sees GYN. Off myrbetriq currently, planning to restart once constipation improved.

## 2023-09-24 NOTE — Assessment & Plan Note (Addendum)
Ongoing mild dysuria since recent heart catheterization-she notes she had 4 attempts at bladder catheterization during this hospitalization.  Update urinalysis-markedly abnormal with large amount of white cells-will start empiric antibiotic while we await urine culture results. No systemic symptoms.  Suspect catheter associated UTI.  Rx keflex antibiotic sent to pharmacy

## 2023-09-24 NOTE — Telephone Encounter (Signed)
Patient returned call regarding lab result/urinalysis. I relayed Dr Timoteo Expose message to her.She verbalized understanding and said that she would go to the pharmacy to pick up the antibiotic.

## 2023-09-24 NOTE — Assessment & Plan Note (Signed)
Preventative protocols reviewed and updated unless pt declined. Discussed healthy diet and lifestyle.  

## 2023-09-24 NOTE — Telephone Encounter (Signed)
Noted  

## 2023-09-26 LAB — URINE CULTURE
MICRO NUMBER:: 15756998
SPECIMEN QUALITY:: ADEQUATE

## 2023-10-02 ENCOUNTER — Encounter: Payer: Self-pay | Admitting: Family Medicine

## 2023-10-03 ENCOUNTER — Other Ambulatory Visit: Payer: Self-pay | Admitting: Family Medicine

## 2023-10-07 NOTE — Telephone Encounter (Signed)
See mychart message  ERx

## 2023-10-07 NOTE — Telephone Encounter (Signed)
Received refill request for antibiotic. Can we call pt to see if this is needed? Ongoing UTI symptoms?

## 2023-10-07 NOTE — Telephone Encounter (Signed)
Patient answered in my chart do you still need [place] to call?

## 2023-10-13 ENCOUNTER — Ambulatory Visit (INDEPENDENT_AMBULATORY_CARE_PROVIDER_SITE_OTHER): Payer: Managed Care, Other (non HMO) | Admitting: Internal Medicine

## 2023-10-13 ENCOUNTER — Encounter: Payer: Self-pay | Admitting: Internal Medicine

## 2023-10-13 VITALS — BP 130/60 | HR 69 | Ht 62.5 in | Wt 134.4 lb

## 2023-10-13 DIAGNOSIS — E785 Hyperlipidemia, unspecified: Secondary | ICD-10-CM | POA: Diagnosis not present

## 2023-10-13 DIAGNOSIS — E1069 Type 1 diabetes mellitus with other specified complication: Secondary | ICD-10-CM | POA: Diagnosis not present

## 2023-10-13 DIAGNOSIS — E103293 Type 1 diabetes mellitus with mild nonproliferative diabetic retinopathy without macular edema, bilateral: Secondary | ICD-10-CM

## 2023-10-13 LAB — POCT GLYCOSYLATED HEMOGLOBIN (HGB A1C): Hemoglobin A1C: 6.6 % — AB (ref 4.0–5.6)

## 2023-10-13 NOTE — Addendum Note (Signed)
Addended by: Carlus Pavlov on: 10/13/2023 04:11 PM   Modules accepted: Level of Service

## 2023-10-13 NOTE — Patient Instructions (Addendum)
Please change: - NPH insulin 9 (may increase to 10 units if needed) in the evening  Continue: - R insulin: B'fast: 5:1 (if going for a walk: 10:1) Lunch: 15:1 Dinner: 25:1 (may need 20:1) - target 150 - insulin sensitivity factor (ISF) 60: 150-200: + 1 unit 201-260: + 2 units 261-320: + 3 units 321-380: + 4 units >380: + 5 units Do not correct bedtime sugars <300, and only inject 1 unit then.  For the snack at night, do not take more than 1-2 units of regular insulin!  Please Read: - Dr. Suzzette Righter - Prevent and Reverse Heart Disease   Please return in 4-6 months.

## 2023-10-13 NOTE — Progress Notes (Addendum)
Patient ID: Felicia [name], female   DOB: 1961-05-10, 62 y.o.   MRN: [id number]  HPI: Felicia [name] is a 62 y.o.-year-old female, presenting for follow-up for DM1, dx in 5 (age 84), uncontrolled, with complications (CAD - s/p 5v CABG, mild retinopathy, gastroparesis). Last visit 6 months ago.  Interim hx: No increased urination, blurry vision, nausea, chest pain.  She had chest pain a few months ago, for which she had to take nitroglycerin. She had wrist sx 05/2023.  She had a UTI in last 2 weeks, developed after a heart cath (50% block, no stent placed),  complicated by bleeding.   Insulin pump: -  >> stopped 02/2022  CGM: - Dexcom G6 >> G7  Reviewed HbA1c levels: Lab Results  Component Value Date   HGBA1C 6.3 05/02/2023   HGBA1C 6.6 (A) 11/01/2022   HGBA1C 7.1 (A) 06/28/2022   HGBA1C 7.3 (A) 03/18/2022   HGBA1C 6.9 (A) 11/30/2021   HGBA1C 7.6 (H) 10/05/2021   HGBA1C 7.9 (H) 06/11/2021   HGBA1C 7.5 (A) 11/30/2020   HGBA1C 7.0 (A) 07/28/2020   HGBA1C 8.9 (H) 02/17/2020   HGBA1C 8.5 (H) 09/23/2019   HGBA1C 9.0 (H) 06/17/2019   HGBA1C 8.0 (A) 09/04/2018   HGBA1C 7.7 (A) 04/17/2018   HGBA1C 7.5 12/16/2017   HGBA1C 7.8 08/14/2017   HGBA1C 7.5 04/08/2017   HGBA1C 7.5 10/29/2016   HGBA1C 7.1 07/30/2016   HGBA1C 7.9 05/06/2016   HGBA1C 7.3 12/28/2015   HGBA1C 7.6 09/27/2015   HGBA1C 7.7 (H) 06/07/2015   HGBA1C 7.5 (H) 03/23/2015   HGBA1C 8.2 (H) 11/15/2014   She is on: - NPH 4  units at night (1-2 am) -added 03/2022 >> 6 >> 7 units at 9-10 pm >> 8 >> 9 (may need 10) >> 8 units at night - R insulin - at the start of the meal or correction- 4-8 units - insulin to carb ratio (ICR)  B'fast: 5:1 (if going for a walk: 10:1) Lunch: 15:1 Dinner: 25:1 - target 150 - insulin sensitivity factor (ISF) 60: 150-200: + 1 unit 201-260: + 2 units 261-320: + 3 units 321-380: + 4 units >380: + 5 units Do not correct bedtime sugars <300, and only inject 1 unit then.  For the snack  at night, do not take more than 1-2 units of regular insulin!  Tried Metformin ER 1000 mg with dinner but stopped due to lack of effect. Tried [place] >> she did not feel that this worked as well as Lantus. Tried Toujeo >> allergy: CP, SOB. She was previously on insulin pump for 5 years (~2010) but she did not like it.  Sugars were not better on this.    She was on an OmniPod 5 insulin pump but felt her sugars were higher >> stopped 02/2022.  Meter: ReliOn  She checks her blood sugars more than 4 times a day with her Dexcom CGM:  Previously:   Previously:   = Lowest sugar was 38 >>...45 (CGM but 62 by glucometer) >> 52 >> 50s >> 55 ; + hypoglycemia awareness in the 70s.  No recent hyper or hypoglycemia admissions.  She has a glucagon kit at home. Highest sugar was 500s (bagel)  ... >> 345 >> 300s >> 400s (?) >> 400s.   Pt's meals are: - Breakfast: protein bar + fruit >> granola bar (25 g carbs) - 10 am snack  - Lunch: apple + cheese, carrots - Snack bar: 15g carbs - Glucerna - Dinner: meat + 1/2  backed potato + veggies/salad - Snacks: popcorn, salty snacks: Potato chips  -No CKD, last BUN/creatinine:  Lab Results  Component Value Date   BUN 25 (H) 09/24/2023   CREATININE 1.19 09/24/2023   Lab Results  Component Value Date   MICRALBCREAT 17.3 09/24/2023   MICRALBCREAT 2.0 08/13/2022   MICRALBCREAT 1.6 06/11/2021   MICRALBCREAT 1.8 09/17/2017   MICRALBCREAT 1.7 03/23/2015   MICRALBCREAT 0.2 11/15/2014  Previously on quinapril, now off.  + HL; last set of lipids: Lab Results  Component Value Date   CHOL 133 09/24/2023   HDL 47.10 09/24/2023   LDLCALC 61 09/24/2023   LDLDIRECT 120.0 06/17/2019   TRIG 125.0 09/24/2023   CHOLHDL 3 09/24/2023  She tried pravastatin, lovastatin, Zetia, even adding co-Q10 but she still had muscle cramps >>  now on Repatha, tolerated well.  Also has 2 creams: Theraworx + Muscle calm.    - last eye exam was on 09/24/2023: + DR  - no  numbness and tingling in her feet. Last foot exam: 04/2023.  On ASA 81.  Latest TSH is normal: Lab Results  Component Value Date   TSH 2.85 09/24/2023   She also has a history of HTN. She has a history of recurrent vaginal candidiasis.  ROS: + See HPI  I reviewed pt's medications, allergies, PMH, social hx, family hx, and changes were documented in the history of present illness. Otherwise, unchanged from my initial visit note.  Past Medical History:  Diagnosis Date   Allergy    Anemia    past history long ago   Borderline HTN (hypertension)    CAD (coronary artery disease)    a. 02/2020 Cath: LM nl, LAD large, 90p, 64m, D1 90p, LCX nl, OM 60-70p, RCA dominant w/ dampening upon engagement of ostium. 70p/m, RPDA 50. EF 55-65%.   COVID-19 virus infection 02/2021   Diabetes (HCC)    Disorder of kidney    has abd kidney (right)   GERD (gastroesophageal reflux disease)    History of diabetic gastroparesis    History of echocardiogram    a. 01/2013 Echo: EF 55-65%, no rwma. Mild MR. Nl RV fxn.   HLD (hyperlipidemia)    Hx: UTI (urinary tract infection)    IBS (irritable bowel syndrome)    Nephrolithiasis 2005   Orbital fracture (HCC) 02/26/2020   Osteopenia 2012   per pt   Radius and ulna distal fracture 02/26/2020   Reflux esophagitis    Systolic murmur 01/2013   mild mitral insuff   Type 1 diabetes mellitus with diabetic retinopathy without macular edema (HCC)    dx at 62 y/o, background retinopathy   Past Surgical History:  Procedure Laterality Date   APPENDECTOMY     CESAREAN SECTION  1985   COLONOSCOPY  07/2016   WNL (Pyrtle)   CORONARY ARTERY BYPASS GRAFT N/A 02/21/2020   Procedure: CORONARY ARTERY BYPASS GRAFTING (CABG) times five using left internal mammary and right leg saphenous vein;  Surgeon: Corliss Skains, MD;  Location: [facility] OR;  Service: Open Heart Surgery;  Laterality: N/A;   CORONARY PRESSURE/FFR STUDY N/A 09/12/2023   Procedure: CORONARY  PRESSURE/FFR STUDY;  Surgeon: [name], MD;  Location: [facility] INVASIVE CV LAB;  Service: Cardiovascular;  Laterality: N/A;   ESOPHAGOGASTRODUODENOSCOPY  07/2016   LA Grade C reflux esophagitis, concern for stasis/dysmotility (Pyrtle)   LAPAROSCOPIC APPENDECTOMY N/A 09/01/2015   Procedure: APPENDECTOMY LAPAROSCOPIC;  Surgeon: Jimmye Norman, MD;  Location: [facility] OR;  Service: General;  Laterality: N/A;  LEFT HEART CATH AND CORONARY ANGIOGRAPHY N/A 02/17/2020   Procedure: LEFT HEART CATH AND CORONARY ANGIOGRAPHY;  Surgeon: Antonieta Iba, MD;  Location: [facility] INVASIVE CV LAB;  Service: Cardiovascular;  Laterality: N/A;   LEFT HEART CATH AND CORS/GRAFTS ANGIOGRAPHY N/A 02/29/2020   early failure of 4/5 bypass grafts - medically managed Clifton James, Nile Dear, MD)   LEFT HEART CATH AND CORS/GRAFTS ANGIOGRAPHY N/A 09/12/2023   Procedure: LEFT HEART CATH AND CORS/GRAFTS ANGIOGRAPHY;  Surgeon: [name], MD;  Location: [facility];  Service: Cardiovascular;  Laterality: N/A;   ORIF FEMUR FRACTURE Left 1996   Corrected by patient in history 2016   PARTIAL HYSTERECTOMY  2000   paps by [facility]   TEE WITHOUT CARDIOVERSION N/A 02/21/2020   Procedure: TRANSESOPHAGEAL ECHOCARDIOGRAM (TEE);  Surgeon: Corliss Skains, MD;  Location: [facility] OR;  Service: Open Heart Surgery;  Laterality: N/A;   TONSILLECTOMY AND ADENOIDECTOMY  1975   TRIGGER FINGER RELEASE Left 12/2010   thumb   Social History   Socioeconomic History   Marital status: Married    Spouse name: Not on file   Number of children: 1   Years of education: 16   Highest education level: Bachelor's degree (e.g., BA, AB, BS)  Occupational History   Occupation: HR Chemical engineer: OTHER    Comment: Scientist, research (medical)  Tobacco Use   Smoking status: Never    Passive exposure: Never   Smokeless tobacco: Never  Vaping Use   Vaping status: Never Used  Substance and Sexual Activity   Alcohol use:  No    Alcohol/week: 0.0 standard drinks of alcohol   Drug use: No   Sexual activity: Yes    Birth control/protection: None    Comment: Married  Other Topics Concern   Not on file  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  Has one daughter.  Education: college.      1 dog   Social Determinants of Corporate investment banker Strain: Not on file  Food Insecurity: Not on file  Transportation Needs: Not on file  Physical Activity: Not on file  Stress: Not on file  Social Connections: Not on file  Intimate Partner Violence: Not on file   Current Outpatient Medications on File Prior to Visit  Medication Sig Dispense Refill   aspirin 81 MG EC tablet Take 1 tablet (81 mg total) by mouth daily.     calcium carbonate (CALCIUM 600) 600 MG TABS tablet Take 1 tablet (600 mg total) by mouth daily with breakfast.     cephALEXin (KEFLEX) 500 MG capsule TAKE 1 CAPSULE BY MOUTH TWICE A DAY 14 capsule 0   Cholecalciferol (VITAMIN D3) 25 MCG (1000 UT) CAPS Take 1 capsule (1,000 Units total) by mouth daily.     colchicine 0.6 MG tablet Take 1 tablet (0.6 mg total) by mouth 2 (two) times daily. (Patient not taking: Reported on 09/24/2023) 60 tablet 2   Continuous Glucose Sensor (DEXCOM G7 SENSOR) MISC CHANGE SENSOR EVERY 10 DAYS 9 each 1   esomeprazole (NEXIUM) 20 MG capsule Take 40 mg by mouth daily.     Evolocumab (REPATHA SURECLICK) 140 MG/ML SOAJ Inject 140 mg into the skin every 14 (fourteen) days. 6 mL 3   Glucagon 3 MG/DOSE POWD Place 3 mg into the nose once as needed for up to 1 dose.  1 each 11   insulin NPH Human (HUMULIN N) 100 UNIT/ML injection Inject 0.08 mLs (8 Units total) into the skin at bedtime. 10 mL 5   insulin regular (HUMULIN R) 100 units/mL injection Inject 1-6 Units into the skin 3 (three) times daily before meals. As instructed per sliding scale     metoprolol succinate (TOPROL-XL) 25 MG 24 hr tablet  TAKE 1 TABLET (25 MG TOTAL) BY MOUTH DAILY. 90 tablet 0   Multiple Vitamin (MULTIVITAMIN) tablet Take 1 tablet by mouth daily.     nitroGLYCERIN (NITROSTAT) 0.4 MG SL tablet Place 1 tablet (0.4 mg total) under the tongue every 5 (five) minutes as needed for chest pain. 25 tablet 3   Omega-3 Fatty Acids (FISH OIL) 500 MG CAPS Take 500 mg by mouth daily.     TECHLITE INSULIN SYRINGE 31G X 5/16" 0.3 ML MISC USE UP TO 5 TIMES DAILY TO INJECT INSULIN DAILY 300 each 1   No current facility-administered medications on file prior to visit.   Allergies  Allergen Reactions   Protonix [Pantoprazole] Swelling    Facial Swelling   Toujeo Solostar [Insulin Glargine] Shortness Of Breath   Lactose Intolerance (Gi)     GI   Magnesium-Containing Compounds Swelling and Other (See Comments)    limbs   Adhesive [Tape] Rash and Other (See Comments)    Band aids   Family History  Problem Relation Age of Onset   Diabetes Maternal Grandmother    Heart failure Maternal Grandmother    COPD Maternal Grandmother        smoker   Thyroid disease Maternal Grandmother    Vascular Disease Maternal Grandmother    Colon cancer Maternal Grandfather    Colon polyps Mother    Breast cancer Other    CAD Brother 59       stent   Esophageal cancer Neg Hx    Rectal cancer Neg Hx    Stomach cancer Neg Hx    PE: BP 130/60   Pulse 69   Ht 5' 2.5" (1.588 m)   Wt 134 lb 6.4 oz (61 kg)   SpO2 99%   BMI 24.19 kg/m    Wt Readings from Last 3 Encounters:  10/13/23 134 lb 6.4 oz (61 kg)  09/24/23 131 lb 6 oz (59.6 kg)  09/12/23 132 lb (59.9 kg)   Constitutional: normal weight, in NAD Eyes: no exophthalmos ENT: no masses palpated in neck, no cervical lymphadenopathy Cardiovascular: RRR, No MRG Respiratory: CTA B Musculoskeletal: no deformities Skin: no rashes Neurological: no tremor with outstretched hands  ASSESSMENT: 1. DM1, uncontrolled, with complications - CAD - s/p 5v CABG - 02/21/2020 - Mild DR -  Gastroparesis - This is the reason why she is on regular insulin, and she is taking it 10 minutes  rather than 30 minutes before eating  She had low CBGs with Novolog!  2. HL  PLAN:  1.  Patient with difficult to control type 1 diabetes, on basal/bolus insulin regimen with intermediate acting and short acting insulin.  She was previously on an insulin pump but she felt that her sugars were higher on the OmniPod 5 device and stopped 02/2022.  Previously, she had another insulin pump more than 10 years prior, but she did not feel that sugars were better on this, also.  She prefers to manage them without the use of the pump. -Her sugars are usually high in the second half of the night and much better controlled during the day.  We have tried different regimens to help with this pattern.  Of note, we could not use a GLP-1 receptor agonist due to her history of gastroparesis and she declined an SGLT2 inhibitor Marcelline Deist). -At last visit, HbA1c was better, improved to 6.3%.  Reviewing the CGM trends, she continues to have dawn phenomenon blood sugars were continuing to improve after she finally started to increase her NPH dose at night.  I advised her to continue to try to increase the dose until sugars improved in the second half of the night.  She only had occasional higher values around 4 PM which she still had to correct with improvement in blood sugars after 11 AM.  She felt that she did not need to fight with her blood sugars as much as before.  We discussed about possible reasons for sugar fluctuations during the day including variable absorption rates, dehydration, stress.   -CGM interpretation: -At today's visit, we reviewed her CGM downloads: It appears that 68% of values are in target range (goal >70%), while 31% are higher than 180 (goal <25%), and 1% are lower than 70 (goal <4%).  The calculated average blood sugar is 159.  The projected HbA1c for the next 3 months (GMI) is 7.1%. -Reviewing the CGM  trends, sugars appear to be higher in the last week compared to previous.  However, she mentions that she had a difficult period of time, with a UTI.  Previous blood sugars were better.  However, she continues to have high blood sugars overnight and upon questioning, she backed off the NPH dose that she mentions that she did not see a difference between using a lower dose and a higher dose.  However, at today's visit we reviewed the CGM trends at the last visit and I explained that sugars were actually better on the higher dose.  She agrees to increase the dose back. -Sugars are increasing also after exercise in the morning, between 9 AM and 12 PM, despite the fact that she is taking 2 units of regular insulin before exercise.  She did not repeat the bolus 1 or 2 more times.  We did discuss about the possibility of taking more units at the beginning of exercise but she mentions that she does sometimes develop blood sugars so she is afraid to take all of it together. -The sugars are high after dinner and we discussed that during the holidays she may need a stricter ICR with this meal (see below).   -She is worried about decreased absorption sites from years of injecting insulin.  We discussed that she is not a candidate for the V-Go or CeQur pump due to the fact that she requires small amounts of insulin.  She is mostly using the abdomen upper arms and we discussed about using the upper thighs and even buttocks.  She will try this.  She tells me that she is considering going back to the insulin pump if the above options do not work well for her. -She is worried about her heart disease and the fact that her 50% obstructed coronary artery was not stented.  She is wondering what she can do about this.  We discussed about the benefits of a plant-based diet and I gave her a reference book for this. -I advised her to Patient Instructions  Please change: - NPH insulin 9 (may increase to 10 units if needed) in the  evening  Continue: - R insulin: B'fast: 5:1 (if going for a walk: 10:1) Lunch: 15:1  Dinner: 25:1 (may need 20:1) - target 150 - insulin sensitivity factor (ISF) 60: 150-200: + 1 unit 201-260: + 2 units 261-320: + 3 units 321-380: + 4 units >380: + 5 units Do not correct bedtime sugars <300, and only inject 1 unit then.  For the snack at night, do not take more than 1-2 units of regular insulin!  Please Read: - Dr. Suzzette Righter - Prevent and Reverse Heart Disease   Please return in 4-6 months.  - we checked her HbA1c: 6.6% (higher) - advised to check sugars at different times of the day - 4x a day, rotating check times - advised for yearly eye exams >> she is UTD - return to clinic in 4-6 months     2. HL -Reviewed latest lipid panel from 09/2023: Fractions at goal except mildly elevated LDL above 55: Lab Results  Component Value Date   CHOL 133 09/24/2023   HDL 47.10 09/24/2023   LDLCALC 61 09/24/2023   LDLDIRECT 120.0 06/17/2019   TRIG 125.0 09/24/2023   CHOLHDL 3 09/24/2023  -On Repatha and fish oil without side effects  Carlus Pavlov, MD PhD [facility] Endocr[facility]

## 2023-10-16 ENCOUNTER — Ambulatory Visit: Payer: Managed Care, Other (non HMO) | Attending: Cardiovascular Disease | Admitting: Cardiovascular Disease

## 2023-10-16 VITALS — BP 139/64 | HR 58 | Ht 64.0 in | Wt 135.4 lb

## 2023-10-16 DIAGNOSIS — E7800 Pure hypercholesterolemia, unspecified: Secondary | ICD-10-CM | POA: Diagnosis not present

## 2023-10-16 DIAGNOSIS — I25708 Atherosclerosis of coronary artery bypass graft(s), unspecified, with other forms of angina pectoris: Secondary | ICD-10-CM

## 2023-10-16 DIAGNOSIS — R030 Elevated blood-pressure reading, without diagnosis of hypertension: Secondary | ICD-10-CM | POA: Diagnosis not present

## 2023-10-16 DIAGNOSIS — I214 Non-ST elevation (NSTEMI) myocardial infarction: Secondary | ICD-10-CM

## 2023-10-16 DIAGNOSIS — E108 Type 1 diabetes mellitus with unspecified complications: Secondary | ICD-10-CM | POA: Diagnosis not present

## 2023-10-16 DIAGNOSIS — I200 Unstable angina: Secondary | ICD-10-CM

## 2023-10-16 NOTE — Patient Instructions (Addendum)
Medication Instructions:  No changes *If you need a refill on your cardiac medications before your next appointment, please call your pharmacy*  Follow-Up: At [facility], you and your health needs are our priority.  As part of our continuing mission to provide you with exceptional heart care, we have created designated Provider Care Teams.  These Care Teams include your primary Cardiologist (physician) and Advanced Practice Providers (APPs -  Physician Assistants and Nurse Practitioners) who all work together to provide you with the care you need, when you need it.  We recommend signing up for the patient portal called "MyChart".  Sign up information is provided on this After Visit Summary.  MyChart is used to connect with patients for Virtual Visits (Telemedicine).  Patients are able to view lab/test results, encounter notes, upcoming appointments, etc.  Non-urgent messages can be sent to your provider as well.   To learn more about what you can do with MyChart, go to [email or web address].    Your next appointment:    Keep scheduled appt for Wednesday 01/14/24 at 0800  Provider:   Thurmon Fair, MD

## 2023-10-16 NOTE — Progress Notes (Signed)
Cardiology Office Note:    Date:  10/18/2023   ID:  Felicia [name], DOB 08-10-1961, MRN [id number]  PCP:  [name], MD  [facility] Cardiologist:  Thurmon Fair, MD  [facility] Electrophysiologist:  None   Referring MD: [name], MD   No chief complaint on file.   History of Present Illness:    Felicia [name] is a 62 y.o. female with a hx of CAD s/p CABG April 2021, with early vein graft occlusion but patent LIMA to LAD, type 1 diabetes mellitus on insulin, hypercholesterolemia intolerant to statins on PCSK9 inhibitor.  She returns in follow-up after undergoing cardiac catheterization.  This was performed after her PET scan showed a large defect with severe reduction in uptake in the apical to mid anterior lateral and apical locations that was partially reversible as well as a mild perfusion defect in the apical to mid inferior wall that was partially reversible.  The overall impression was of infarction with peri-infarct ischemia.  The global myocardial blood flow reserve was only 1.59. She continues to have complex coronary artery anatomy with significant stenosis in the native coronary arteries and all vein grafts occluded, but with a patent LIMA-LAD.  There were no good options for revascularization.  The vessels that had high-grade stenoses were too small for angioplasty/stent.  She continues to have occasional problems with angina pectoris which make her very concerned and emotional, but she is still able to work full-time and take care of household chores.  She does not have problems with shortness of breath.  She has not had lower extremity edema, orthopnea or PND, palpitations or syncope, and claudication.    Glycemic control is exceptional with a hemoglobin A1c of 6.6% and her he lipid parameters are also great with an HDL 47, LDL 61 and normal triglycerides.  She does not smoke.  Her blood pressure was quite high when she first came in since she had  [place].  At home before leaving, her blood pressure was 112/63, which is not typical for her.  Early postoperative course was complicated by an episode of syncope with fall and R orbital fracture and left wrist fracture and coronary/graft angiography showed that she had lost patency of all the SVGs.  The LIMA to LAD bypass was patent.  No intervention was performed.  Echocardiogram showed that left ventricular wall motion was abnormal, significant for moderate hypokinesis of the apical segments of the anterior and anterolateral walls, but overall LVEF was normal at 55 to 60%.  Nuclear stress test 08/03/2020 showed LVEF 70% and a small severe scar in the apex, but no ischemia.  Diagnostic cardiac catheterization 09/12/2023  Severe three vessel CAD s/p 4V CABG with 1/4 patent bypass grafts The LAD has severe mid stenosis. The mid and distal LAD fills from the patent LIMA. The small caliber diagonal branch has severe diffuse disease, not a target for PCI. The vein graft to the diagonal branch is known to be occluded and was not injected.  The Circumflex has moderate non-flow limiting disease. The vein graft to the obtuse marginal branch is known to be occluded and was not injected.  The large dominant RCA has moderate, heavily calcified stenosis in the proximal and mid vessel. Diffuse disease in the small caliber PDA. The vein graft to the posterolateral artery and PDA is known to be occluded and was not injected.  Pressure wire analysis of the RCA with  RFR of 0.93 suggesting the lesion in the mid RCA is not flow limiting.  Normal LVEDP   Recommendations: Continue medical management of CAD.    Dominance: Right         Past Medical History:  Diagnosis Date   Allergy    Anemia    past history long ago   Borderline HTN (hypertension)    CAD (coronary artery disease)    a. 02/2020 Cath: LM nl, LAD large, 90p, 37m, D1 90p, LCX nl, OM  60-70p, RCA dominant w/ dampening upon engagement of ostium. 70p/m, RPDA 50. EF 55-65%.   COVID-19 virus infection 02/2021   Diabetes (HCC)    Disorder of kidney    has abd kidney (right)   GERD (gastroesophageal reflux disease)    History of diabetic gastroparesis    History of echocardiogram    a. 01/2013 Echo: EF 55-65%, no rwma. Mild MR. Nl RV fxn.   HLD (hyperlipidemia)    Hx: UTI (urinary tract infection)    IBS (irritable bowel syndrome)    Nephrolithiasis 2005   Orbital fracture (HCC) 02/26/2020   Osteopenia 2012   per pt   Radius and ulna distal fracture 02/26/2020   Reflux esophagitis    Systolic murmur 01/2013   mild mitral insuff   Type 1 diabetes mellitus with diabetic retinopathy without macular edema (HCC)    dx at 62 y/o, background retinopathy    Past Surgical History:  Procedure Laterality Date   APPENDECTOMY     CESAREAN SECTION  1985   COLONOSCOPY  07/2016   WNL (Pyrtle)   CORONARY ARTERY BYPASS GRAFT N/A 02/21/2020   Procedure: CORONARY ARTERY BYPASS GRAFTING (CABG) times five using left internal mammary and right leg saphenous vein;  Surgeon: Corliss Skains, MD;  Location: [facility] OR;  Service: Open Heart Surgery;  Laterality: N/A;   CORONARY PRESSURE/FFR STUDY N/A 09/12/2023   Procedure: CORONARY PRESSURE/FFR STUDY;  Surgeon: [name], MD;  Location: [facility] INVASIVE CV LAB;  Service: Cardiovascular;  Laterality: N/A;   ESOPHAGOGASTRODUODENOSCOPY  07/2016   LA Grade C reflux esophagitis, concern for stasis/dysmotility (Pyrtle)   LAPAROSCOPIC APPENDECTOMY N/A 09/01/2015   Procedure: APPENDECTOMY LAPAROSCOPIC;  Surgeon: Jimmye Norman, MD;  Location: [facility] OR;  Service: General;  Laterality: N/A;   LEFT HEART CATH AND CORONARY ANGIOGRAPHY N/A 02/17/2020   Procedure: LEFT HEART CATH AND CORONARY ANGIOGRAPHY;  Surgeon: Antonieta Iba, MD;  Location: [facility] INVASIVE CV LAB;  Service: Cardiovascular;  Laterality: N/A;   LEFT HEART CATH AND CORS/GRAFTS  ANGIOGRAPHY N/A 02/29/2020   early failure of 4/5 bypass grafts - medically managed Clifton James, Nile Dear, MD)   LEFT HEART CATH AND CORS/GRAFTS ANGIOGRAPHY N/A 09/12/2023   Procedure: LEFT HEART CATH AND CORS/GRAFTS ANGIOGRAPHY;  Surgeon: [name], MD;  Location: [facility] INVASIVE CV LAB;  Service: Cardiovascular;  Laterality: N/A;   ORIF FEMUR FRACTURE Left 1996   Corrected by patient in history 2016   PARTIAL HYSTERECTOMY  2000   paps by [facility]   TEE WITHOUT CARDIOVERSION N/A 02/21/2020   Procedure: TRANSESOPHAGEAL ECHOCARDIOGRAM (TEE);  Surgeon: Corliss Skains, MD;  Location: [facility] OR;  Service: Open Heart Surgery;  Laterality: N/A;   TONSILLECTOMY AND ADENOIDECTOMY  1975   TRIGGER FINGER RELEASE Left 12/2010   thumb    Current Medications: Current Meds  Medication Sig   aspirin 81 MG EC tablet Take 1 tablet (81 mg total) by mouth daily.   calcium carbonate (CALCIUM 600) 600 MG  TABS tablet Take 1 tablet (600 mg total) by mouth daily with breakfast.   Cholecalciferol (VITAMIN D3) 25 MCG (1000 UT) CAPS Take 1 capsule (1,000 Units total) by mouth daily.   colchicine 0.6 MG tablet Take 1 tablet (0.6 mg total) by mouth 2 (two) times daily.   Continuous Glucose Sensor (DEXCOM G7 SENSOR) MISC CHANGE SENSOR EVERY 10 DAYS   esomeprazole (NEXIUM) 20 MG capsule Take 40 mg by mouth daily.   Evolocumab (REPATHA SURECLICK) 140 MG/ML SOAJ Inject 140 mg into the skin every 14 (fourteen) days.   Glucagon 3 MG/DOSE POWD Place 3 mg into the nose once as needed for up to 1 dose.   insulin NPH Human (HUMULIN N) 100 UNIT/ML injection Inject 0.08 mLs (8 Units total) into the skin at bedtime.   insulin regular (HUMULIN R) 100 units/mL injection Inject 1-6 Units into the skin 3 (three) times daily before meals. As instructed per sliding scale   metoprolol succinate (TOPROL-XL) 25 MG 24 hr tablet TAKE 1 TABLET (25 MG TOTAL) BY MOUTH DAILY.   Multiple Vitamin (MULTIVITAMIN) tablet  Take 1 tablet by mouth daily.   Omega-3 Fatty Acids (FISH OIL) 500 MG CAPS Take 500 mg by mouth daily.   TECHLITE INSULIN SYRINGE 31G X 5/16" 0.3 ML MISC USE UP TO 5 TIMES DAILY TO INJECT INSULIN DAILY     Allergies:   Protonix [pantoprazole], Toujeo solostar [insulin glargine], Lactose intolerance (gi), Magnesium-containing compounds, and Adhesive [tape]   Social History   Socioeconomic History   Marital status: Married    Spouse name: Not on file   Number of children: 1   Years of education: 16   Highest education level: Bachelor's degree (e.g., BA, AB, BS)  Occupational History   Occupation: HR Chemical engineer: OTHER    Comment: Scientist, research (medical)  Tobacco Use   Smoking status: Never    Passive exposure: Never   Smokeless tobacco: Never  Vaping Use   Vaping status: Never Used  Substance and Sexual Activity   Alcohol use: No    Alcohol/week: 0.0 standard drinks of alcohol   Drug use: No   Sexual activity: Yes    Birth control/protection: None    Comment: Married  Other Topics Concern   Not on file  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  Has one daughter.  Education: college.      1 dog   Social Drivers of Corporate investment banker Strain: Not on file  Food Insecurity: Not on file  Transportation Needs: Not on file  Physical Activity: Not on file  Stress: Not on file  Social Connections: Not on file     Family History: The patient's family history includes Breast cancer in an other family member; CAD (age of onset: 79) in her brother; COPD in her maternal grandmother; Colon cancer in her maternal grandfather; Colon polyps in her mother; Diabetes in her maternal grandmother; Heart failure in her maternal grandmother; Thyroid disease in her maternal grandmother; Vascular Disease in her maternal grandmother. There is no history of Esophageal cancer, Rectal cancer, or Stomach  cancer.  ROS:   Please see the history of present illness.     All other systems reviewed and are negative.  EKGs/Labs/Other Studies Reviewed:    The following studies were reviewed today: Cardiac catheterizations performed on 4/15 (before bypass) and 4/27 (after bypass  surgery) Echocardiograms performed on 4/15 and 4/25  Cardiac PET scan 08/07/2023     Findings are consistent with infarction with peri-infarct ischemia. The study is intermediate risk. Sum difference score 6. Peri-infarct ischemia in the anterior and anterolateral walls primarily. Intermediate risk study due to ischemia, suboptimal augmentation of EF with stress, and reduced stress CFR.   LV perfusion is abnormal. There is evidence of ischemia. There is evidence of infarction. Defect 1: There is a large defect with severe reduction in uptake present in the apical to mid anterior, lateral and apex location(s) that is partially reversible. Mild perfusion defect in in the apical to mid inferior wall that is partially reversible. There is abnormal wall motion in the defect area. Consistent with infarction and peri-infarct ischemia.   Rest left ventricular function is normal. Rest EF: 67%. Stress left ventricular function is normal. Stress EF: 69%. End diastolic cavity size is normal. End systolic cavity size is normal. No evidence of transient ischemic dilation (TID) noted.   Myocardial blood flow was computed to be 1.55ml/g/min at rest and 1.70ml/g/min at stress. Global myocardial blood flow reserve was 1.59 and was abnormal. High rest flows due artificially decrease MBFR, however stress flows are <2, however this is challenging to interpret in the setting of prior CABG.   Coronary calcium assessment not performed due to prior revascularization.   Echo 06/12/2020  1. Left ventricular ejection fraction, by estimation, is 55 to 60%. The  left ventricle has normal function. The left ventricle has no regional  wall motion  abnormalities. Left ventricular diastolic parameters are  consistent with Grade II diastolic  dysfunction (pseudonormalization).   2. Right ventricular systolic function is normal. The right ventricular  size is normal.   3. The mitral valve is normal in structure. Mild mitral valve  regurgitation. No evidence of mitral stenosis.   4. The aortic valve is tricuspid. Aortic valve regurgitation is not  visualized. Mild aortic valve sclerosis is present, with no evidence of  aortic valve stenosis.   5. The inferior vena cava is normal in size with greater than 50%  respiratory variability, suggesting right atrial pressure of 3 mmHg.   EKG: Personally reviewed most recent ECG tracing from 09/12/2023 which shows normal sinus rhythm and generalized low voltage QRS without any ST-T repolarization abnormalities.  EKG Interpretation Date/Time:    Ventricular Rate:    PR Interval:    QRS Duration:    QT Interval:    QTC Calculation:   R Axis:      Text Interpretation:           Recent Labs: 08/27/2023: Hemoglobin 13.9; Platelets 267 09/24/2023: ALT 12; BUN 25; Creatinine, Ser 1.19; Potassium 4.5; Sodium 138; TSH 2.85  Recent Lipid Panel    Component Value Date/Time   CHOL 133 09/24/2023 0914   TRIG 125.0 09/24/2023 0914   HDL 47.10 09/24/2023 0914   CHOLHDL 3 09/24/2023 0914   VLDL 25.0 09/24/2023 0914   LDLCALC 61 09/24/2023 0914   LDLDIRECT 120.0 06/17/2019 0738    Physical Exam:    VS:  BP 139/64   Pulse (!) 58   Ht 5\' 4"  (1.626 m)   Wt 135 lb 6.4 oz (61.4 kg)   SpO2 99%   BMI 23.24 kg/m     Wt Readings from Last 3 Encounters:  10/17/23 135 lb 6 oz (61.4 kg)  10/16/23 135 lb 6.4 oz (61.4 kg)  10/13/23 134 lb 6.4 oz (61 kg)  General: Alert, oriented x3, no distress, very petite, lean Head: no evidence of trauma, PERRL, EOMI, no exophtalmos or lid lag, no myxedema, no xanthelasma; normal ears, nose and oropharynx Neck: normal jugular venous pulsations and no  hepatojugular reflux; brisk carotid pulses without delay and no carotid bruits Chest: clear to auscultation, no signs of consolidation by percussion or palpation, normal fremitus, symmetrical and full respiratory excursions Cardiovascular: normal position and quality of the apical impulse, regular rhythm, normal first and second heart sounds, no murmurs, rubs or gallops Abdomen: no tenderness or distention, no masses by palpation, no abnormal pulsatility or arterial bruits, normal bowel sounds, no hepatosplenomegaly Extremities: no clubbing, cyanosis or edema; 2+ radial, ulnar and brachial pulses bilaterally; 2+ right femoral, posterior tibial and dorsalis pedis pulses; 2+ left femoral, posterior tibial and dorsalis pedis pulses; no subclavian or femoral bruits Neurological: grossly nonfocal Psych: Normal mood and affect     ASSESSMENT:    1. Coronary artery disease involving coronary bypass graft of native heart with other forms of angina pectoris (HCC)   2. Hypercholesterolemia   3. Controlled type 1 diabetes mellitus with complication, with long-term current use of insulin (HCC)   4. Elevated blood pressure reading        PLAN:    In order of problems listed above:  CAD s/p CABG: Felicia [name] continues to have occasional problems with angina pectoris and has evidence of ischemia around the previous anterior wall infarction, but unfortunately does not have any good targets for revascularization.  The vessels that have tight stenoses are too small for angioplasty or stent.  The large conduits did not have significant stenoses.  The LIMA is widely patent and supplies good blood flow to the apex, but there is ischemia in the mid anterior wall in the territory of the severely diseased small diagonal artery.  Similarly, her right coronary artery is widely patent with a nonsignificant stenosis, but the distal small PDA has a tight stenosis.  The oblique marginal branches are not amenable to  revascularization.  The best option remains medical therapy.   HLP: All lipid parameters are in the desirable range..  Even her triglycerides have now reached normal range without specific triglyceride lowering medications.  Continue Repatha. DM:  Type 1 diabetes mellitus for 45 years. She is very insulin sensitive. Glycemic control is excellent, but she has problems with scar tissue forming where she usually injects the insulin.  They have had discussions regarding going back on an insulin pump.  Followed by Dr. Elvera Lennox in the endocrinology clinic. Elevated blood pressure is situational and unusual for her.   Medication Adjustments/Labs and Tests Ordered: Current medicines are reviewed at length with the patient today.  Concerns regarding medicines are outlined above.  Orders Placed This Encounter  Procedures   EKG 12-Lead   No orders of the defined types were placed in this encounter.   Patient Instructions  Medication Instructions:  No changes *If you need a refill on your cardiac medications before your next appointment, please call your pharmacy*  Follow-Up: At [facility], you and your health needs are our priority.  As part of our continuing mission to provide you with exceptional heart care, we have created designated Provider Care Teams.  These Care Teams include your primary Cardiologist (physician) and Advanced Practice Providers (APPs -  Physician Assistants and Nurse Practitioners) who all work together to provide you with the care you need, when you need it.  We recommend signing up for the patient  portal called "MyChart".  Sign up information is provided on this After Visit Summary.  MyChart is used to connect with patients for Virtual Visits (Telemedicine).  Patients are able to view lab/test results, encounter notes, upcoming appointments, etc.  Non-urgent messages can be sent to your provider as well.   To learn more about what you can do with MyChart, go to  [email or web address].    Your next appointment:    Keep scheduled appt for Wednesday 01/14/24 at 0800  Provider:   Thurmon Fair, MD        Signed, Thurmon Fair, MD  10/18/2023 7:22 PM    [facility]

## 2023-10-17 ENCOUNTER — Ambulatory Visit (INDEPENDENT_AMBULATORY_CARE_PROVIDER_SITE_OTHER): Payer: Managed Care, Other (non HMO) | Admitting: Family Medicine

## 2023-10-17 ENCOUNTER — Encounter: Payer: Self-pay | Admitting: Family Medicine

## 2023-10-17 VITALS — BP 132/68 | HR 62 | Temp 97.9°F | Ht 64.0 in | Wt 135.4 lb

## 2023-10-17 DIAGNOSIS — N390 Urinary tract infection, site not specified: Secondary | ICD-10-CM

## 2023-10-17 DIAGNOSIS — R300 Dysuria: Secondary | ICD-10-CM

## 2023-10-17 LAB — POC URINALSYSI DIPSTICK (AUTOMATED)
Bilirubin, UA: NEGATIVE
Glucose, UA: NEGATIVE
Ketones, UA: NEGATIVE
Nitrite, UA: NEGATIVE
Protein, UA: POSITIVE — AB
Spec Grav, UA: 1.015 (ref 1.010–1.025)
Urobilinogen, UA: 0.2 U/dL
pH, UA: 6.5 (ref 5.0–8.0)

## 2023-10-17 MED ORDER — CIPROFLOXACIN HCL 500 MG PO TABS: 500.0000 mg | ORAL_TABLET | Freq: Two times a day (BID) | ORAL | 0 refills | Status: AC

## 2023-10-17 NOTE — Progress Notes (Signed)
Ph: [phone] Fax: [phone]   Patient ID: Felicia [name], female    DOB: 1961-07-27, 62 y.o.   MRN: [id number]  This visit was conducted in person.  BP 132/68   Pulse 62   Temp 97.9 F (36.6 C) (Oral)   Ht 5\' 4"  (1.626 m)   Wt 135 lb 6 oz (61.4 kg)   SpO2 99%   BMI 23.24 kg/m    CC: dysuria  Subjective:   HPI: Felicia [name] is a 62 y.o. female presenting on 10/17/2023 for Dysuria (C/o pain/burning when urinating and frequency. )   T1 diabetic followed by endo.  CAD s/p CABG with early graft failure of all 3 vein grafts - sees Dr Juel Burrow. Continues repatha (intolerant to statins).   Recent repeat L heart catheterization 09/12/2023 showing severe 3v CAD s/p 4v CABG with 1/4 patent grafts. Severe mid-LAD stenosis - rec continued aggressive medical management.    During heart catheterization had four I/O urine catheterization attempts.   Subsequently seen in office for physical on 09/24/2023 with concerns for thicker abnormal urine with dysuria, had abnormal UA, UCx grew >100k pan-sensitive E coli treated with keflex 500mg  BID 7d course with improvement while on antibiotic. Cr 1.19, GFR 49.67.   Finished antibiotic on 10/01/2023, 2d later developed recurrent dysuria so treated with second 7d keflex BID course.   Finished antibiotic 10/15/2023, 2d later developed recurrent dysuria, urinary frequency, urgency, incomplete emptying. Feels pressure sensation to vaginal area when she starts to urinate followed by burning discomfort during stream.  No blood in urine.  No flank pain, nausea/vomiting, fevers/chills.  No external rash or discharge noted.  No new sexual partners.   She has schedule [facility] urology appt for 11/19/2023.      Relevant past medical, surgical, family and social history reviewed and updated as indicated. Interim medical history since our last visit reviewed. Allergies and medications reviewed and updated. Outpatient Medications Prior  to Visit  Medication Sig Dispense Refill   aspirin 81 MG EC tablet Take 1 tablet (81 mg total) by mouth daily.     calcium carbonate (CALCIUM 600) 600 MG TABS tablet Take 1 tablet (600 mg total) by mouth daily with breakfast.     Cholecalciferol (VITAMIN D3) 25 MCG (1000 UT) CAPS Take 1 capsule (1,000 Units total) by mouth daily.     colchicine 0.6 MG tablet Take 1 tablet (0.6 mg total) by mouth 2 (two) times daily. 60 tablet 2   Continuous Glucose Sensor (DEXCOM G7 SENSOR) MISC CHANGE SENSOR EVERY 10 DAYS 9 each 1   esomeprazole (NEXIUM) 20 MG capsule Take 40 mg by mouth daily.     Evolocumab (REPATHA SURECLICK) 140 MG/ML SOAJ Inject 140 mg into the skin every 14 (fourteen) days. 6 mL 3   Glucagon 3 MG/DOSE POWD Place 3 mg into the nose once as needed for up to 1 dose. 1 each 11   insulin NPH Human (HUMULIN N) 100 UNIT/ML injection Inject 0.08 mLs (8 Units total) into the skin at bedtime. 10 mL 5   insulin regular (HUMULIN R) 100 units/mL injection Inject 1-6 Units into the skin 3 (three) times daily before meals. As instructed per sliding scale     metoprolol succinate (TOPROL-XL) 25 MG 24 hr tablet TAKE 1 TABLET (25 MG TOTAL) BY MOUTH DAILY. 90 tablet 0   Multiple Vitamin (MULTIVITAMIN) tablet Take 1 tablet by mouth daily.     nitroGLYCERIN (NITROSTAT) 0.4 MG SL tablet Place 1 tablet (0.4  mg total) under the tongue every 5 (five) minutes as needed for chest pain. 25 tablet 3   Omega-3 Fatty Acids (FISH OIL) 500 MG CAPS Take 500 mg by mouth daily.     TECHLITE INSULIN SYRINGE 31G X 5/16" 0.3 ML MISC USE UP TO 5 TIMES DAILY TO INJECT INSULIN DAILY 300 each 1   cephALEXin (KEFLEX) 500 MG capsule TAKE 1 CAPSULE BY MOUTH TWICE A DAY 14 capsule 0   No facility-administered medications prior to visit.     Per HPI unless specifically indicated in ROS section below Review of Systems  Objective:  BP 132/68   Pulse 62   Temp 97.9 F (36.6 C) (Oral)   Ht 5\' 4"  (1.626 m)   Wt 135 lb 6 oz (61.4  kg)   SpO2 99%   BMI 23.24 kg/m   Wt Readings from Last 3 Encounters:  10/17/23 135 lb 6 oz (61.4 kg)  10/16/23 135 lb 6.4 oz (61.4 kg)  10/13/23 134 lb 6.4 oz (61 kg)      Physical Exam Vitals and nursing note reviewed. Exam conducted with a chaperone present.  Constitutional:      Appearance: Normal appearance. She is not ill-appearing.  Abdominal:     General: Bowel sounds are normal. There is no distension.     Palpations: Abdomen is soft. There is no mass.     Tenderness: There is no abdominal tenderness. There is no right CVA tenderness, left CVA tenderness, guarding or rebound.     Hernia: No hernia is present.  Genitourinary:    Pubic Area: No rash.      Labia:        Right: No rash.        Left: No rash.      Urethra: No prolapse.     Comments:  External exam only: Mild erythema to urethra without prolapse No evident discharge Vulvar atrophy  Neurological:     Mental Status: She is alert.  Psychiatric:        Mood and Affect: Mood normal.        Behavior: Behavior normal.       Results for orders placed or performed in visit on 10/17/23  POCT Urinalysis Dipstick (Automated)   Collection Time: 10/17/23  4:10 PM  Result Value Ref Range   Color, UA yellow    Clarity, UA cloudy    Glucose, UA Negative Negative   Bilirubin, UA negative    Ketones, UA negative    Spec Grav, UA 1.015 1.010 - 1.025   Blood, UA 3+    pH, UA 6.5 5.0 - 8.0   Protein, UA Positive (A) Negative   Urobilinogen, UA 0.2 0.2 or 1.0 E.U./dL   Nitrite, UA negative    Leukocytes, UA Large (3+) (A) Negative  Thick cloudy urine  Micro: WBC TNTC RBC TNTC Bact tr Casts none Epi rare UCx sent  Lab Results  Component Value Date   NA 138 09/24/2023   CL 100 09/24/2023   K 4.5 09/24/2023   CO2 32 09/24/2023   BUN 25 (H) 09/24/2023   CREATININE 1.19 09/24/2023   GFR 49.67 (L) 09/24/2023   CALCIUM 9.8 09/24/2023   PHOS 3.8 10/05/2021   ALBUMIN 4.1 09/24/2023   GLUCOSE 139 (H)  09/24/2023   Assessment & Plan:   Problem List Items Addressed This Visit     Recurrent UTI - Primary   Recurrent UTI r/o urethritis.  Started after instrumentation last month. Has failed  to fully resolve after two 7d courses of Keflex antibiotic.  UA/micro again abnormal. UCx sent.  Rx ciprofloxacin 500mg  BID. Discussed monitoring for tendon discomfort/ risk of rupture. Discussed monitoring for diarrhea/ risk of C diff. Emphasized good hydration status. Consider topical estrogen.  Refer to urology for recurrent UTI.       Relevant Orders   Urine Culture   Ambulatory referral to Urology   Other Visit Diagnoses       Dysuria       Relevant Orders   POCT Urinalysis Dipstick (Automated) (Completed)        Meds ordered this encounter  Medications   ciprofloxacin (CIPRO) 500 MG tablet    Sig: Take 1 tablet (500 mg total) by mouth 2 (two) times daily for 5 days.    Dispense:  10 tablet    Refill:  0    Orders Placed This Encounter  Procedures   Urine Culture   Ambulatory referral to Urology    Referral Priority:   Routine    Referral Type:   Consultation    Referral Reason:   Specialty Services Required    Requested Specialty:   Urology    Number of Visits Requested:   1   POCT Urinalysis Dipstick (Automated)    Patient Instructions  For recurrent UTI - start ciprofloxacin antibiotic 500mg  twice daily for 5 days.  We will refer you to urology for further evaluation.  Let us know if recurrent symptoms after treatment.   Follow up plan: Return if symptoms worsen or fail to improve.  [name], MD

## 2023-10-17 NOTE — Telephone Encounter (Signed)
Per Dr Reece Agar, he can see pt at 4:00 today.   Spoke with pt offering OV today at 4:00. She agrees and will be her at 3:45.   Plz add pt to Dr Timoteo Expose schedule.

## 2023-10-17 NOTE — Assessment & Plan Note (Addendum)
Recurrent UTI r/o urethritis.  Started after instrumentation last month. Has failed to fully resolve after two 7d courses of Keflex antibiotic.  UA/micro again abnormal. UCx sent.  Rx ciprofloxacin 500mg  BID. Discussed monitoring for tendon discomfort/ risk of rupture. Discussed monitoring for diarrhea/ risk of C diff. Emphasized good hydration status. Consider topical estrogen.  Refer to urology for recurrent UTI.

## 2023-10-17 NOTE — Telephone Encounter (Signed)
Spoke with pt, pt requested another appt with Dr. Reece Agar for ongoing bladder issues. Pt asked if any rxs can be sent in for her, to have some type of relief until her f/u appt? Call back # [phone]

## 2023-10-17 NOTE — Patient Instructions (Addendum)
For recurrent UTI - start ciprofloxacin antibiotic 500mg  twice daily for 5 days.  We will refer you to urology for further evaluation.  Let us know if recurrent symptoms after treatment.

## 2023-10-18 ENCOUNTER — Encounter: Payer: Self-pay | Admitting: Cardiovascular Disease

## 2023-10-19 LAB — URINE CULTURE
MICRO NUMBER:: 15850293
SPECIMEN QUALITY:: ADEQUATE

## 2023-10-20 ENCOUNTER — Ambulatory Visit: Payer: Managed Care, Other (non HMO) | Admitting: Internal Medicine

## 2023-10-22 ENCOUNTER — Ambulatory Visit: Payer: Managed Care, Other (non HMO) | Admitting: Family Medicine

## 2023-10-28 ENCOUNTER — Encounter: Payer: Self-pay | Admitting: Family Medicine

## 2023-10-30 ENCOUNTER — Encounter: Payer: Self-pay | Admitting: Family Medicine

## 2023-10-31 MED ORDER — CEPHALEXIN 500 MG PO CAPS: 500.0000 mg | ORAL_CAPSULE | Freq: Two times a day (BID) | ORAL | 0 refills | Status: DC

## 2023-10-31 MED ORDER — ESTRADIOL 0.1 MG/GM VA CREA: TOPICAL_CREAM | VAGINAL | 3 refills | Status: AC

## 2023-10-31 NOTE — Telephone Encounter (Addendum)
See other mychart encounter.

## 2023-11-03 ENCOUNTER — Ambulatory Visit: Payer: Managed Care, Other (non HMO) | Admitting: Internal Medicine

## 2023-11-19 ENCOUNTER — Ambulatory Visit: Payer: Self-pay | Admitting: Urology

## 2023-11-24 ENCOUNTER — Ambulatory Visit: Payer: Self-pay | Admitting: Family Medicine

## 2023-11-24 ENCOUNTER — Telehealth (INDEPENDENT_AMBULATORY_CARE_PROVIDER_SITE_OTHER): Payer: Managed Care, Other (non HMO) | Admitting: Family Medicine

## 2023-11-24 VITALS — BP 109/58 | HR 56 | Ht 64.0 in | Wt 133.0 lb

## 2023-11-24 DIAGNOSIS — U071 COVID-19: Secondary | ICD-10-CM | POA: Diagnosis not present

## 2023-11-24 MED ORDER — NIRMATRELVIR/RITONAVIR (PAXLOVID) TABLET (RENAL DOSING): 2.0000 | ORAL_TABLET | Freq: Two times a day (BID) | ORAL | 0 refills | Status: AC

## 2023-11-24 NOTE — Assessment & Plan Note (Signed)
Reviewed currently approved antiviral treatments.  Reviewed expected course of illness, anticipated course of recovery, as well as red flags to suggest COVID pneumonia and/or to seek urgent in-person care. Reviewed CDC isolation/quarantine guidelines.  Encouraged fluids and rest. Reviewed further supportive care measures at home including vit C 500mg  bid, vit D 2000 IU daily, zinc 100mg  daily, tylenol PRN, pepcid 20mg  BID PRN.   Recommend:  Renally dosed Paxlovid.   Paxlovid drug interactions:  Colchicine - avoid use  [email or web address]

## 2023-11-24 NOTE — Progress Notes (Signed)
Ph: [phone] Fax: [phone]   Patient ID: Felicia [name], female    DOB: 1961-09-15, 63 y.o.   MRN: [id number]  Virtual visit completed through MyChart, a video enabled telemedicine application. Due to national recommendations of social distancing due to COVID-19, a virtual visit is felt to be most appropriate for this patient at this time. Reviewed limitations, risks, security and privacy concerns of performing a virtual visit and the availability of in person appointments. I also reviewed that there may be a patient responsible charge related to this service. The patient agreed to proceed.   Patient location: home Provider location: [place] at [facility], office Persons participating in this virtual visit: patient, provider   If any vitals were documented, they were collected by patient at home unless specified below.    BP (!) 109/58   Pulse (!) 56   Ht 5\' 4"  (1.626 m)   Wt 133 lb (60.3 kg)   BMI 22.83 kg/m    CC: COVID infection  Subjective:   HPI: Felicia [name] is a 63 y.o. female presenting on 11/24/2023 for Covid Positive (C/o cough, congestion and fatigue. Sxs started 11/22/23. Pos home Covid test- 11/23/23. )   First day of symptoms: 11/22/2023 Tested COVID positive: 11/23/2023  Current symptoms: congestion, cough, mild dizziness, fatigue, body aches, sudden onset No: fever/chills, dyspnea, wheezing, abd pain or nausea, diarrhea.  Treatments to date: mucinex and sugar free tussin, started amoxicillin  Risk factors include: T1DM, CHF, CAD, renal insufficiency  COVID vaccination status: x2      Relevant past medical, surgical, family and social history reviewed and updated as indicated. Interim medical history since our last visit reviewed. Allergies and medications reviewed and updated. Outpatient Medications Prior to Visit  Medication Sig Dispense Refill   aspirin 81 MG EC tablet Take 1 tablet (81 mg total) by mouth daily.     calcium carbonate (CALCIUM  600) 600 MG TABS tablet Take 1 tablet (600 mg total) by mouth daily with breakfast.     Cholecalciferol (VITAMIN D3) 25 MCG (1000 UT) CAPS Take 1 capsule (1,000 Units total) by mouth daily.     colchicine 0.6 MG tablet Take 1 tablet (0.6 mg total) by mouth 2 (two) times daily. 60 tablet 2   Continuous Glucose Sensor (DEXCOM G7 SENSOR) MISC CHANGE SENSOR EVERY 10 DAYS 9 each 1   esomeprazole (NEXIUM) 20 MG capsule Take 40 mg by mouth daily.     estradiol (ESTRACE VAGINAL) 0.1 MG/GM vaginal cream Place 1 Applicatorful vaginally at bedtime for 14 days, THEN 1 Applicatorful every Monday, Wednesday, and Friday. 42.5 g 3   Evolocumab (REPATHA SURECLICK) 140 MG/ML SOAJ Inject 140 mg into the skin every 14 (fourteen) days. 6 mL 3   Glucagon 3 MG/DOSE POWD Place 3 mg into the nose once as needed for up to 1 dose. 1 each 11   insulin NPH Human (HUMULIN N) 100 UNIT/ML injection Inject 0.08 mLs (8 Units total) into the skin at bedtime. 10 mL 5   insulin regular (HUMULIN R) 100 units/mL injection Inject 1-6 Units into the skin 3 (three) times daily before meals. As instructed per sliding scale     metoprolol succinate (TOPROL-XL) 25 MG 24 hr tablet TAKE 1 TABLET (25 MG TOTAL) BY MOUTH DAILY. 90 tablet 0   Multiple Vitamin (MULTIVITAMIN) tablet Take 1 tablet by mouth daily.     nitroGLYCERIN (NITROSTAT) 0.4 MG SL tablet Place 1 tablet (0.4 mg total) under the tongue every  5 (five) minutes as needed for chest pain. 25 tablet 3   Omega-3 Fatty Acids (FISH OIL) 500 MG CAPS Take 500 mg by mouth daily.     TECHLITE INSULIN SYRINGE 31G X 5/16" 0.3 ML MISC USE UP TO 5 TIMES DAILY TO INJECT INSULIN DAILY 300 each 1   cephALEXin (KEFLEX) 500 MG capsule Take 1 capsule (500 mg total) by mouth 2 (two) times daily. 14 capsule 0   No facility-administered medications prior to visit.     Per HPI unless specifically indicated in ROS section below Review of Systems Objective:  BP (!) 109/58   Pulse (!) 56   Ht 5\' 4"   (1.626 m)   Wt 133 lb (60.3 kg)   BMI 22.83 kg/m   Wt Readings from Last 3 Encounters:  11/24/23 133 lb (60.3 kg)  10/17/23 135 lb 6 oz (61.4 kg)  10/16/23 135 lb 6.4 oz (61.4 kg)       Physical exam: Gen: alert, NAD, tired but not ill appearing Pulm: speaks in complete sentences without increased work of breathing Psych: normal mood, normal thought content      Lab Results  Component Value Date   NA 138 09/24/2023   CL 100 09/24/2023   K 4.5 09/24/2023   CO2 32 09/24/2023   BUN 25 (H) 09/24/2023   CREATININE 1.19 09/24/2023   GFR 49.67 (L) 09/24/2023   CALCIUM 9.8 09/24/2023   PHOS 3.8 10/05/2021   ALBUMIN 4.1 09/24/2023   GLUCOSE 139 (H) 09/24/2023   Assessment & Plan:   COVID-19 virus infection Assessment & Plan: Reviewed currently approved antiviral treatments.  Reviewed expected course of illness, anticipated course of recovery, as well as red flags to suggest COVID pneumonia and/or to seek urgent in-person care. Reviewed CDC isolation/quarantine guidelines.  Encouraged fluids and rest. Reviewed further supportive care measures at home including vit C 500mg  bid, vit D 2000 IU daily, zinc 100mg  daily, tylenol PRN, pepcid 20mg  BID PRN.   Recommend:  Renally dosed Paxlovid.   Paxlovid drug interactions:  Colchicine - avoid use  [email or web address]    Other orders -     nirmatrelvir/ritonavir (renal dosing); Take 2 tablets by mouth 2 (two) times daily for 5 days. (Take nirmatrelvir 150 mg one tablet twice daily for 5 days and ritonavir 100 mg one tablet twice daily for 5 days) Patient GFR is 49  Dispense: 20 tablet; Refill: 0     I discussed the assessment and treatment plan with the patient. The patient was provided an opportunity to ask questions and all were answered. The patient agreed with the plan and demonstrated an understanding of the instructions. The patient was advised to call back or seek an in-person evaluation if the symptoms worsen or if the  condition fails to improve as anticipated.  Follow up plan: No follow-ups on file.  [name], MD

## 2023-11-24 NOTE — Telephone Encounter (Signed)
Copied from CRM [id number]. Topic: Clinical - Medical Advice >> Nov 24, 2023  8:06 AM Felicia [name] wrote: Reason for CRM: Patient called in regarding her and husband being diagnosed with COVID, would like to know if her and her husband can prescribed something for it . Is requesting a callback at [phone]   Chief Complaint: Covid Positive, High Risk Symptoms: fatigue, "feel like got run over by a truck," congestion, dizziness yesterday, cough Frequency: continual Pertinent Negatives: Patient denies SOB, fever, dizziness today Disposition: [] 911 / [] ED /[] Urgent Care (no appt availability in office) / [] Appointment(In office/virtual)/ []  [facility]/ [] Home Care/ [] Refused Recommended Disposition /[] [facility]/ [x]  Follow-up with PCP Additional Notes: Pt reporting she and husband are covid positive. Pt requesting script for Paxlovid, as pt has CHF and diabetes, pt symptoms started Saturday and tested positive for covid yesterday. Pt reporting no SOB, "just congested," "was a little dizzy I think because got up too quickly yesterday," but no dizziness today, fatigue, and "feel like got run over by a truck." Pt confirms taken paxlovid before, prescribed by Dr. [name]. Pt reporting she had also been taking mucinex and "sugar free long acting tussin," pt asking if okay to continue taking while on paxlovid - nurse researched to find "moderate" interaction between mucinex and paxlovid - please advise. Pt requesting script be sent to [facility] on [address] in [place]. Pt reporting paxlovid "taste lasts forever in mouth," asking what can do to counteract that. Advised food with stronger tastes like fruit smoothie, peanut butter, etc. Advised call if worsening symptoms or other questions. Pt verbalized understanding.  Reason for Disposition  [1] HIGH RISK patient (e.g., weak immune system, age > 64 years, obesity with BMI 30 or higher, pregnant, chronic lung disease or other chronic medical  condition) AND [2] COVID symptoms (e.g., cough, fever)  (Exceptions: Already seen by PCP and no new or worsening symptoms.)  Answer Assessment - Initial Assessment Questions 1. COVID-19 DIAGNOSIS: "How do you know that you have COVID?" (e.g., positive lab test or self-test, diagnosed by doctor or NP/PA, symptoms after exposure).     Positive test at home yesterday 3. ONSET: "When did the COVID-19 symptoms start?"      Saturday 4. WORST SYMPTOM: "What is your worst symptom?" (e.g., cough, fever, shortness of breath, muscle aches)     Fatigue, "feel like got run over by a truck" 5. COUGH: "Do you have a cough?" If Yes, ask: "How bad is the cough?"       No SOB, confirms cough 7. RESPIRATORY STATUS: "Describe your breathing?" (e.g., normal; shortness of breath, wheezing, unable to speak)      No SOB 9. OTHER SYMPTOMS: "Do you have any other symptoms?"  (e.g., chills, fatigue, headache, loss of smell or taste, muscle pain, sore throat)     Fatigue, congestion, dizziness yesterday but not today 10. HIGH RISK DISEASE: "Do you have any chronic medical problems?" (e.g., asthma, heart or lung disease, weak immune system, obesity, etc.)       Diabetes and CHF, had paxlovid in the past  Protocols used: Coronavirus (COVID-19) Diagnosed or Suspected-A-AH

## 2023-11-24 NOTE — Telephone Encounter (Signed)
Spoke with Felicia [name] offering video visit now, per Dr Reece Agar. Felicia [name] agrees.   Had Felicia [name] added to schedule.

## 2023-12-04 ENCOUNTER — Other Ambulatory Visit: Payer: Self-pay | Admitting: Cardiovascular Disease

## 2023-12-05 ENCOUNTER — Other Ambulatory Visit: Payer: Self-pay | Admitting: Family Medicine

## 2023-12-09 NOTE — Telephone Encounter (Signed)
 ERx

## 2024-01-07 ENCOUNTER — Telehealth: Payer: Self-pay

## 2024-01-07 ENCOUNTER — Other Ambulatory Visit (HOSPITAL_COMMUNITY): Payer: Self-pay

## 2024-01-07 ENCOUNTER — Encounter: Payer: Self-pay | Admitting: Internal Medicine

## 2024-01-07 MED ORDER — INSULIN NPH (HUMAN) (ISOPHANE) 100 UNIT/ML ~~LOC~~ SUSP: 8.0000 [IU] | Freq: Every day | SUBCUTANEOUS | 5 refills | Status: DC

## 2024-01-07 MED ORDER — INSULIN REGULAR HUMAN 100 UNIT/ML IJ SOLN: 1.0000 [IU] | Freq: Three times a day (TID) | INTRAMUSCULAR | 3 refills | Status: DC

## 2024-01-07 NOTE — Telephone Encounter (Signed)
 Pharmacy Patient Advocate Encounter   Received notification from Pt Calls Messages that prior authorization for Dexcom G7 sensor is required/requested.   Insurance verification completed.   The patient is insured through [employer] .   Per test claim: Refill too soon. PA is not needed at this time. Medication was filled 12/19/23. Next eligible fill date is 03/05/24.

## 2024-01-07 NOTE — Telephone Encounter (Signed)
 Pt needs PA for Dexcom G7

## 2024-01-14 ENCOUNTER — Encounter: Payer: Self-pay | Admitting: Cardiovascular Disease

## 2024-01-14 ENCOUNTER — Ambulatory Visit: Payer: Managed Care, Other (non HMO) | Attending: Cardiovascular Disease | Admitting: Cardiovascular Disease

## 2024-01-14 VITALS — BP 132/70 | HR 55 | Ht 64.0 in | Wt 135.4 lb

## 2024-01-14 DIAGNOSIS — E782 Mixed hyperlipidemia: Secondary | ICD-10-CM

## 2024-01-14 DIAGNOSIS — R030 Elevated blood-pressure reading, without diagnosis of hypertension: Secondary | ICD-10-CM

## 2024-01-14 DIAGNOSIS — I25708 Atherosclerosis of coronary artery bypass graft(s), unspecified, with other forms of angina pectoris: Secondary | ICD-10-CM | POA: Diagnosis not present

## 2024-01-14 DIAGNOSIS — E108 Type 1 diabetes mellitus with unspecified complications: Secondary | ICD-10-CM | POA: Diagnosis not present

## 2024-01-14 MED ORDER — RANOLAZINE ER 1000 MG PO TB12: 1000.0000 mg | ORAL_TABLET | Freq: Two times a day (BID) | ORAL | 3 refills | Status: DC

## 2024-01-14 MED ORDER — RANOLAZINE ER 500 MG PO TB12: 500.0000 mg | ORAL_TABLET | Freq: Two times a day (BID) | ORAL | 0 refills | Status: DC

## 2024-01-14 MED ORDER — ISOSORBIDE MONONITRATE ER 30 MG PO TB24: 30.0000 mg | ORAL_TABLET | Freq: Every day | ORAL | 3 refills | Status: DC

## 2024-01-14 NOTE — Patient Instructions (Addendum)
 Medication Instructions:  2 separate scripts sent for Renexa 500 mg by mouth twice per day for 7 days then on day 8 start new final dose of 1000 by mouth twice per day. Start Imdur 30 mg by mouth daily. *If you need a refill on your cardiac medications before your next appointment, please call your pharmacy*   Follow-Up: At [facility], you and your health needs are our priority.  As part of our continuing mission to provide you with exceptional heart care, we have created designated Provider Care Teams.  These Care Teams include your primary Cardiologist (physician) and Advanced Practice Providers (APPs -  Physician Assistants and Nurse Practitioners) who all work together to provide you with the care you need, when you need it.  We recommend signing up for the patient portal called "MyChart".  Sign up information is provided on this After Visit Summary.  MyChart is used to connect with patients for Virtual Visits (Telemedicine).  Patients are able to view lab/test results, encounter notes, upcoming appointments, etc.  Non-urgent messages can be sent to your provider as well.   To learn more about what you can do with MyChart, go to [email or web address].    Your next appointment:   2 month(s)  Provider:   First Available APP    Then, Thurmon Fair, MD will plan to see you again in 6 month(s).    Other Instructions

## 2024-01-14 NOTE — Progress Notes (Signed)
 Cardiology Office Note:    Date:  01/18/2024   ID:  Felicia [name], DOB 1961-07-27, MRN [id number]  PCP:  [name], MD  [facility] Cardiologist:  Thurmon Fair, MD  [facility] Electrophysiologist:  None   Referring MD: [name], MD   Chief Complaint  Patient presents with   Coronary Artery Disease    History of Present Illness:    Felicia [name] is a 63 y.o. female with a hx of CAD s/p CABG April 2021, with early vein graft occlusion but patent LIMA to LAD, type 1 diabetes mellitus on insulin, hypercholesterolemia intolerant to statins on PCSK9 inhibitor.  She continues to have problems of shortness of breath and chest pain with activity.  Interestingly, she is able to exercise on her home treadmill with a added incline and does so on a virtual daily basis.  This does not make her have chest discomfort.  On the other hand, climbing the hill towards her church may to have severe chest discomfort and dyspnea.  She had to stop and rest several times.  Her cardiac catheterization unfortunately not show any options for percutaneous revascularization. She has complex native coronary artery stenoses with significant obstruction in all 3 major coronary territories.  She has a patent LIMA-LAD but all the vein graft occluded shortly after her bypass surgery.  All the vessels with significant stenoses were too small for PCI-stent.  Her PET scan had showed a large and severe perfusion defect in the mid apical anterolateral segments and the apex, as well as a mild defect in the mid apical inferior wall that was partially reversible.  The overall impression was infarction.  Infarct ischemia.  The myocardial blood flow reserve is only 1.59.    She has not had syncope, palpitations, lower extremity edema, orthopnea, PND.  She does have occasional cramping in her lower extremities with walking.  Metabolic control is excellent with a hemoglobin A1c of 6.6% and LDL of 61 and HDL of  41, normal triglycerides.  Her antianginal regimen plan consists of metoprolol succinate 25 mg daily and as needed sublingual nitroglycerin.  Previous attempts at enhanced antianginal therapy were always limited by her low blood pressure, but more recently her blood pressure has been a little more robust.  Early postoperative course was complicated by an episode of syncope with fall and R orbital fracture and left wrist fracture and coronary/graft angiography showed that she had lost patency of all the SVGs.  The LIMA to LAD bypass was patent.  No intervention was performed.  Echocardiogram showed that left ventricular wall motion was abnormal, significant for moderate hypokinesis of the apical segments of the anterior and anterolateral walls, but overall LVEF was normal at 55 to 60%.  Nuclear stress test 08/03/2020 showed LVEF 70% and a small severe scar in the apex, but no ischemia.  Diagnostic cardiac catheterization 09/12/2023  Severe three vessel CAD s/p 4V CABG with 1/4 patent bypass grafts The LAD has severe mid stenosis. The mid and distal LAD fills from the patent LIMA. The small caliber diagonal branch has severe diffuse disease, not a target for PCI. The vein graft to the diagonal branch is known to be occluded and was not injected.  The Circumflex has moderate non-flow limiting disease. The vein graft to the obtuse marginal branch is known to be occluded and was not injected.  The large dominant RCA has moderate, heavily calcified stenosis in the proximal and mid vessel. Diffuse disease in the small caliber PDA.  The vein graft to the posterolateral artery and PDA is known to be occluded and was not injected.  Pressure wire analysis of the RCA with RFR of 0.93 suggesting the lesion in the mid RCA is not flow limiting.  Normal LVEDP   Recommendations: Continue medical management of CAD.    Dominance: Right         Past Medical History:  Diagnosis Date   Allergy    Anemia     past history long ago   Borderline HTN (hypertension)    CAD (coronary artery disease)    a. 02/2020 Cath: LM nl, LAD large, 90p, 11m, D1 90p, LCX nl, OM 60-70p, RCA dominant w/ dampening upon engagement of ostium. 70p/m, RPDA 50. EF 55-65%.   COVID-19 virus infection 02/2021   Diabetes (HCC)    Disorder of kidney    has abd kidney (right)   GERD (gastroesophageal reflux disease)    History of diabetic gastroparesis    History of echocardiogram    a. 01/2013 Echo: EF 55-65%, no rwma. Mild MR. Nl RV fxn.   HLD (hyperlipidemia)    Hx: UTI (urinary tract infection)    IBS (irritable bowel syndrome)    Nephrolithiasis 2005   Orbital fracture (HCC) 02/26/2020   Osteopenia 2012   per pt   Radius and ulna distal fracture 02/26/2020   Reflux esophagitis    Systolic murmur 01/2013   mild mitral insuff   Type 1 diabetes mellitus with diabetic retinopathy without macular edema (HCC)    dx at 63 y/o, background retinopathy    Past Surgical History:  Procedure Laterality Date   APPENDECTOMY     CESAREAN SECTION  1985   COLONOSCOPY  07/2016   WNL (Pyrtle)   CORONARY ARTERY BYPASS GRAFT N/A 02/21/2020   Procedure: CORONARY ARTERY BYPASS GRAFTING (CABG) times five using left internal mammary and right leg saphenous vein;  Surgeon: Corliss Skains, MD;  Location: [facility] OR;  Service: Open Heart Surgery;  Laterality: N/A;   CORONARY PRESSURE/FFR STUDY N/A 09/12/2023   Procedure: CORONARY PRESSURE/FFR STUDY;  Surgeon: [name], MD;  Location: [facility] INVASIVE CV LAB;  Service: Cardiovascular;  Laterality: N/A;   ESOPHAGOGASTRODUODENOSCOPY  07/2016   LA Grade C reflux esophagitis, concern for stasis/dysmotility (Pyrtle)   LAPAROSCOPIC APPENDECTOMY N/A 09/01/2015   Procedure: APPENDECTOMY LAPAROSCOPIC;  Surgeon: Jimmye Norman, MD;  Location: [facility] OR;  Service: General;  Laterality: N/A;   LEFT HEART CATH AND CORONARY ANGIOGRAPHY N/A 02/17/2020   Procedure: LEFT HEART CATH AND CORONARY  ANGIOGRAPHY;  Surgeon: Antonieta Iba, MD;  Location: [facility] INVASIVE CV LAB;  Service: Cardiovascular;  Laterality: N/A;   LEFT HEART CATH AND CORS/GRAFTS ANGIOGRAPHY N/A 02/29/2020   early failure of 4/5 bypass grafts - medically managed Clifton James, Nile Dear, MD)   LEFT HEART CATH AND CORS/GRAFTS ANGIOGRAPHY N/A 09/12/2023   Procedure: LEFT HEART CATH AND CORS/GRAFTS ANGIOGRAPHY;  Surgeon: [name], MD;  Location: [facility] INVASIVE CV LAB;  Service: Cardiovascular;  Laterality: N/A;   ORIF FEMUR FRACTURE Left 1996   Corrected by patient in history 2016   PARTIAL HYSTERECTOMY  2000   paps by [facility]   TEE WITHOUT CARDIOVERSION N/A 02/21/2020   Procedure: TRANSESOPHAGEAL ECHOCARDIOGRAM (TEE);  Surgeon: Corliss Skains, MD;  Location: [facility] OR;  Service: Open Heart Surgery;  Laterality: N/A;   TONSILLECTOMY AND ADENOIDECTOMY  1975   TRIGGER FINGER RELEASE Left 12/2010   thumb    Current Medications: Current Meds  Medication Sig   aspirin 81 MG EC tablet Take 1 tablet (81 mg total) by mouth daily.   calcium carbonate (CALCIUM 600) 600 MG TABS tablet Take 1 tablet (600 mg total) by mouth daily with breakfast.   Continuous Glucose Sensor (DEXCOM G7 SENSOR) MISC CHANGE SENSOR EVERY 10 DAYS   esomeprazole (NEXIUM) 20 MG capsule Take 40 mg by mouth daily.   Evolocumab (REPATHA SURECLICK) 140 MG/ML SOAJ Inject 140 mg into the skin every 14 (fourteen) days.   Glucagon 3 MG/DOSE POWD Place 3 mg into the nose once as needed for up to 1 dose.   insulin NPH Human (HUMULIN N) 100 UNIT/ML injection Inject 0.08 mLs (8 Units total) into the skin at bedtime.   insulin regular (HUMULIN R) 100 units/mL injection Inject 0.01-0.06 mLs (1-6 Units total) into the skin 3 (three) times daily before meals. As instructed per sliding scale   isosorbide mononitrate (IMDUR) 30 MG 24 hr tablet Take 1 tablet (30 mg total) by mouth daily.   metoprolol succinate (TOPROL-XL) 25 MG 24 hr tablet  TAKE 1 TABLET (25 MG TOTAL) BY MOUTH DAILY.   mirabegron ER (MYRBETRIQ) 25 MG TB24 tablet Take 25 mg by mouth daily.   Multiple Vitamin (MULTIVITAMIN) tablet Take 1 tablet by mouth daily.   Omega-3 Fatty Acids (FISH OIL) 500 MG CAPS Take 500 mg by mouth daily.   ranolazine (RANEXA) 1000 MG SR tablet Take 1 tablet (1,000 mg total) by mouth 2 (two) times daily.   ranolazine (RANEXA) 500 MG 12 hr tablet Take 1 tablet (500 mg total) by mouth 2 (two) times daily for 7 days.   TECHLITE INSULIN SYRINGE 31G X 5/16" 0.3 ML MISC USE UP TO 5 TIMES DAILY TO INJECT INSULIN DAILY     Allergies:   Protonix [pantoprazole], Toujeo solostar [insulin glargine], Lactose intolerance (gi), Magnesium-containing compounds, and Adhesive [tape]      Family History: The patient's family history includes Breast cancer in an other family member; CAD (age of onset: 2) in her brother; COPD in her maternal grandmother; Colon cancer in her maternal grandfather; Colon polyps in her mother; Diabetes in her maternal grandmother; Heart failure in her maternal grandmother; Thyroid disease in her maternal grandmother; Vascular Disease in her maternal grandmother. There is no history of Esophageal cancer, Rectal cancer, or Stomach cancer.  EKGs/Labs/Other Studies Reviewed:    The following studies were reviewed today: Cardiac catheterization 09/12/2023    Prox LAD to Mid LAD lesion is 90% stenosed.   Mid LAD lesion is 50% stenosed.   Origin to Prox Graft lesion is 100% stenosed.   Origin lesion is 100% stenosed.   Origin to Prox Graft lesion before RPAV  is 100% stenosed.   1st Diag lesion is 99% stenosed.   Mid RCA lesion is 60% stenosed.   Ost RCA to Prox RCA lesion is 50% stenosed.   1st Mrg-1 lesion is 50% stenosed.   1st Mrg-2 lesion is 50% stenosed.   RPDA lesion is 80% stenosed.   LIMA and is normal in caliber.   SVG graft was not injected.   SVG graft was not injected.   SVG graft was not injected.   Severe  three vessel CAD s/p 4V CABG with 1/4 patent bypass grafts The LAD has severe mid stenosis. The mid and distal LAD fills from the patent LIMA. The small caliber diagonal branch has severe diffuse disease, not a target for PCI. The vein graft to the diagonal branch is known to  be occluded and was not injected.  The Circumflex has moderate non-flow limiting disease. The vein graft to the obtuse marginal branch is known to be occluded and was not injected.  The large dominant RCA has moderate, heavily calcified stenosis in the proximal and mid vessel. Diffuse disease in the small caliber PDA. The vein graft to the posterolateral artery and PDA is known to be occluded and was not injected.  Pressure wire analysis of the RCA with RFR of 0.93 suggesting the lesion in the mid RCA is not flow limiting.  Normal LVEDP   Recommendations: Continue medical management of CAD.   Diagnostic Dominance: Right      Cardiac PET scan 08/07/2023     Findings are consistent with infarction with peri-infarct ischemia. The study is intermediate risk. Sum difference score 6. Peri-infarct ischemia in the anterior and anterolateral walls primarily. Intermediate risk study due to ischemia, suboptimal augmentation of EF with stress, and reduced stress CFR.   LV perfusion is abnormal. There is evidence of ischemia. There is evidence of infarction. Defect 1: There is a large defect with severe reduction in uptake present in the apical to mid anterior, lateral and apex location(s) that is partially reversible. Mild perfusion defect in in the apical to mid inferior wall that is partially reversible. There is abnormal wall motion in the defect area. Consistent with infarction and peri-infarct ischemia.   Rest left ventricular function is normal. Rest EF: 67%. Stress left ventricular function is normal. Stress EF: 69%. End diastolic cavity size is normal. End systolic cavity size is normal. No evidence of transient ischemic  dilation (TID) noted.   Myocardial blood flow was computed to be 1.35ml/g/min at rest and 1.75ml/g/min at stress. Global myocardial blood flow reserve was 1.59 and was abnormal. High rest flows due artificially decrease MBFR, however stress flows are <2, however this is challenging to interpret in the setting of prior CABG.   Coronary calcium assessment not performed due to prior revascularization.   Echo 06/12/2020  1. Left ventricular ejection fraction, by estimation, is 55 to 60%. The  left ventricle has normal function. The left ventricle has no regional  wall motion abnormalities. Left ventricular diastolic parameters are  consistent with Grade II diastolic  dysfunction (pseudonormalization).   2. Right ventricular systolic function is normal. The right ventricular  size is normal.   3. The mitral valve is normal in structure. Mild mitral valve  regurgitation. No evidence of mitral stenosis.   4. The aortic valve is tricuspid. Aortic valve regurgitation is not  visualized. Mild aortic valve sclerosis is present, with no evidence of  aortic valve stenosis.   5. The inferior vena cava is normal in size with greater than 50%  respiratory variability, suggesting right atrial pressure of 3 mmHg.   EKG:   EKG Interpretation Date/Time:  Wednesday January 14 2024 08:07:15 EDT Ventricular Rate:  55 PR Interval:  148 QRS Duration:  74 QT Interval:  416 QTC Calculation: 397 R Axis:   75  Text Interpretation: Sinus bradycardia Low voltage QRS Possible Lateral infarct , age undetermined When compared with ECG of 12-Sep-2023 10:06, No significant change was found Confirmed by Tascha Casares [phone]) on 01/14/2024 8:47:02 AM         Recent Labs: 08/27/2023: Hemoglobin 13.9; Platelets 267 09/24/2023: ALT 12; BUN 25; Creatinine, Ser 1.19; Potassium 4.5; Sodium 138; TSH 2.85  Recent Lipid Panel    Component Value Date/Time   CHOL 133 09/24/2023 0914   TRIG 125.0  09/24/2023 0914   HDL  47.10 09/24/2023 0914   CHOLHDL 3 09/24/2023 0914   VLDL 25.0 09/24/2023 0914   LDLCALC 61 09/24/2023 0914   LDLDIRECT 120.0 06/17/2019 0738    Physical Exam:    VS:  BP 132/70 (BP Location: Left Arm, Patient Position: Sitting, Cuff Size: Normal)   Pulse (!) 55   Ht 5\' 4"  (1.626 m)   Wt 135 lb 6.4 oz (61.4 kg)   BMI 23.24 kg/m     Wt Readings from Last 3 Encounters:  01/14/24 135 lb 6.4 oz (61.4 kg)  11/24/23 133 lb (60.3 kg)  10/17/23 135 lb 6 oz (61.4 kg)     General: Alert, oriented x3, no distress, lean and small frame Head: no evidence of trauma, PERRL, EOMI, no exophtalmos or lid lag, no myxedema, no xanthelasma; normal ears, nose and oropharynx Neck: normal jugular venous pulsations and no hepatojugular reflux; brisk carotid pulses without delay and no carotid bruits Chest: clear to auscultation, no signs of consolidation by percussion or palpation, normal fremitus, symmetrical and full respiratory excursions Cardiovascular: normal position and quality of the apical impulse, regular rhythm, normal first and second heart sounds, no murmurs, rubs or gallops Abdomen: no tenderness or distention, no masses by palpation, no abnormal pulsatility or arterial bruits, normal bowel sounds, no hepatosplenomegaly Extremities: no clubbing, cyanosis or edema; 2+ radial, ulnar and brachial pulses bilaterally; 2+ right femoral, posterior tibial and dorsalis pedis pulses; 2+ left femoral, posterior tibial and dorsalis pedis pulses; no subclavian or femoral bruits Neurological: grossly nonfocal Psych: Normal mood and affect   ASSESSMENT:    1. Coronary artery disease of bypass graft of native heart with stable angina pectoris (HCC)   2. Mixed hyperlipidemia   3. Controlled type 1 diabetes mellitus with complication, with long-term current use of insulin (HCC)         PLAN:    In order of problems listed above:  CAD s/p CABG: Unfortunately there is no good option for either  percutaneous or surgical revascularization.  The LIMA is widely patent and supplies good blood flow to the apex, but there is ischemia in the mid anterior wall in the territory of the severely diseased small diagonal artery.  Similarly, her right coronary artery is widely patent with a nonsignificant stenosis, but the distal small PDA has a tight stenosis.  The oblique marginal branches are not amenable to revascularization.  The best option remains medical therapy.  Will add ranolazine and long-acting nitrates.  Start the isosorbide mononitrate first and after 3 days or so start the Ranexa at the lower dose, increase to target dose after a week. HLP: All her lipid parameters are in desirable range on Repatha and with better glucose control.  Continue Repatha. DM: Excellent glycemic control.  Type 1 diabetes mellitus for 45 years. She is very insulin sensitive.  Followed by Dr. Elvera Lennox in the endocrinology clinic.    Medication Adjustments/Labs and Tests Ordered: Current medicines are reviewed at length with the patient today.  Concerns regarding medicines are outlined above.  Orders Placed This Encounter  Procedures   EKG 12-Lead   Meds ordered this encounter  Medications   isosorbide mononitrate (IMDUR) 30 MG 24 hr tablet    Sig: Take 1 tablet (30 mg total) by mouth daily.    Dispense:  90 tablet    Refill:  3   ranolazine (RANEXA) 500 MG 12 hr tablet    Sig: Take 1 tablet (500 mg total) by mouth  2 (two) times daily for 7 days.    Dispense:  14 tablet    Refill:  0   ranolazine (RANEXA) 1000 MG SR tablet    Sig: Take 1 tablet (1,000 mg total) by mouth 2 (two) times daily.    Dispense:  180 tablet    Refill:  3    Patient Instructions  Medication Instructions:  2 separate scripts sent for Renexa 500 mg by mouth twice per day for 7 days then on day 8 start new final dose of 1000 by mouth twice per day. Start Imdur 30 mg by mouth daily. *If you need a refill on your cardiac medications  before your next appointment, please call your pharmacy*   Follow-Up: At [facility], you and your health needs are our priority.  As part of our continuing mission to provide you with exceptional heart care, we have created designated Provider Care Teams.  These Care Teams include your primary Cardiologist (physician) and Advanced Practice Providers (APPs -  Physician Assistants and Nurse Practitioners) who all work together to provide you with the care you need, when you need it.  We recommend signing up for the patient portal called "MyChart".  Sign up information is provided on this After Visit Summary.  MyChart is used to connect with patients for Virtual Visits (Telemedicine).  Patients are able to view lab/test results, encounter notes, upcoming appointments, etc.  Non-urgent messages can be sent to your provider as well.   To learn more about what you can do with MyChart, go to [email or web address].    Your next appointment:   2 month(s)  Provider:   First Available APP    Then, Thurmon Fair, MD will plan to see you again in 6 month(s).    Other Instructions         Signed, Thurmon Fair, MD  01/18/2024 10:00 PM    [facility]

## 2024-01-21 ENCOUNTER — Encounter: Payer: Self-pay | Admitting: Cardiovascular Disease

## 2024-01-21 NOTE — Telephone Encounter (Signed)
 Called and spoke to pt; Dr. Renaye Rakers recommendations given.  Pt verbalized understanding.  She will stop Ranexa for now.  She plans on starting the Imdur on Friday (01/23/2024) when she is working from home.

## 2024-01-21 NOTE — Telephone Encounter (Signed)
 Some patients take a longer time to adjust to the Ranexa (I would not abandon it permanently, but we should probably interrupted and see how she feels).  I would definitely advise against increasing the dose to 1000 mg.   Let's try stopping the Ranexa and starting the Imdur instead, see how she feels with that

## 2024-01-21 NOTE — Telephone Encounter (Signed)
 Called and spoke to patient about her symptoms/side effects with Ranexa.  After speaking to Pharm D [name] P.), it was confirmed that pt should stop the Ranexa for now and assess symptoms.  Pt states she has not started the IMDUR yet.  She was instructed to first start off with the lower dose Ranexa, then go to higher dose, and then after that start the [facility].  She started taking the Ranexa last Friday and gradually began to feel more and more fatigued; she is also dizzy at times, but most concerning she feels that her throat is closing and she is not able to get food down.  She tried carbonated beverages with no improvement.    She agrees to stop Ranexa and will wait for a response from Dr. Salena Saner.

## 2024-02-28 ENCOUNTER — Other Ambulatory Visit: Payer: Self-pay | Admitting: Cardiovascular Disease

## 2024-03-10 ENCOUNTER — Ambulatory Visit (INDEPENDENT_AMBULATORY_CARE_PROVIDER_SITE_OTHER)
Admission: RE | Admit: 2024-03-10 | Discharge: 2024-03-10 | Disposition: A | Discharge status: Home / Self Care | Source: Ambulatory Visit | Attending: Family Medicine | Admitting: Family Medicine

## 2024-03-10 ENCOUNTER — Ambulatory Visit: Payer: Self-pay | Admitting: Family Medicine

## 2024-03-10 ENCOUNTER — Encounter: Payer: Self-pay | Admitting: Family Medicine

## 2024-03-10 VITALS — BP 129/73 | HR 55 | Temp 98.0°F | Ht 64.0 in | Wt 136.0 lb

## 2024-03-10 DIAGNOSIS — M79672 Pain in left foot: Secondary | ICD-10-CM | POA: Diagnosis not present

## 2024-03-10 DIAGNOSIS — G8929 Other chronic pain: Secondary | ICD-10-CM | POA: Diagnosis not present

## 2024-03-10 DIAGNOSIS — M84375A Stress fracture, left foot, initial encounter for fracture: Secondary | ICD-10-CM | POA: Diagnosis not present

## 2024-03-10 DIAGNOSIS — N3941 Urge incontinence: Secondary | ICD-10-CM | POA: Diagnosis not present

## 2024-03-10 DIAGNOSIS — M810 Age-related osteoporosis without current pathological fracture: Secondary | ICD-10-CM | POA: Diagnosis not present

## 2024-03-10 DIAGNOSIS — M84372A Stress fracture, left ankle, initial encounter for fracture: Secondary | ICD-10-CM | POA: Diagnosis not present

## 2024-03-10 MED ORDER — TOLTERODINE TARTRATE 2 MG PO TABS: 2.0000 mg | ORAL_TABLET | Freq: Every day | ORAL | 1 refills | Status: DC

## 2024-03-10 NOTE — Patient Instructions (Addendum)
 Xrays today - no obvious fracture. Will await formal radiology read.  Limited treatment options. We could consider tramadol  prescription for pain.  Use ankle sleeve, supportive firm shoes.  Let us  know if worsening to consider further imaging vs return to Dr Geralyn Knee for re-evaluation. Good to see you today.  I have refilled Detrol for you.

## 2024-03-10 NOTE — Assessment & Plan Note (Addendum)
 Tender to palpation along 2nd/3rd MT shafts as well as anterior ankle.  Check xrays r/o stress fracture - no obvious fracture, will await radiology read.  Limited therapeutic options in h/o T1DM and CAD - likely avoid NSAIDs, prednisone .  Colchicine  hasn't been too effective.  Declines Rx Tramadol .  She declines postop shoe use - states this previously worsened symptoms.  She will continue tylenol . Rec ankle and midfoot sleeve for increased support, discussed importance of supportive shoewear.  Rec sports med f/u vs advanced imaging if not improving with above.

## 2024-03-10 NOTE — Progress Notes (Addendum)
 Ph: [phone] Fax: [phone]   Patient ID: Myrle Aspen, female    DOB: June 09, 1961, 63 y.o.   MRN: [id number]  This visit was conducted in person.  BP 129/73 (BP Location: Right Arm, Cuff Size: Normal) Comment: at home this morning  Pulse (!) 55   Temp 98 F (36.7 C) (Oral)   Ht 5\' 4"  (1.626 m)   Wt 136 lb (61.7 kg)   SpO2 98%   BMI 23.34 kg/m   Elevated on repeat BP 129/73 at home this morning  CC: L foot pain  Subjective:   HPI: DENE DANTUONO is a 63 y.o. female presenting on 03/10/2024 for Medical Management of Chronic Issues (Here for possible Gout in L foot; started a while ago sometime last year and seen a sports med doctor that spoke about Gout; was given medication (Colchicine ), but not helping, still hurting; makes some kind of crackle pop sound when walking; aching pain)   Evaluated by sports med 09/2023 for similar issue - dx gout, treated with colchicine  with limited benefit. Since then she's avoiding shrimp, shellfish.   Intermittent L foot pain from 1st 3 MTPJs into anterior ankle - since 09/2023, acutely worsening over the past 5 weeks. Worse as day goes on.   She's been using colchicine  BID PRN without significant improvement.  Denies inciting trauma/injury or falls.  No significant swelling or redness or warmth to area.   She does regularly walk on treadmill daily  - uses good sneakers so no significant pain with this.  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  Followed by Lorette Ronco.  She is not on bone strengthening medication. She is only on calcium .  Taavon retired - finding new GYN - requests Rx Detrol  refill until then.   She is not taking Ranexa  - removed from med list.      Relevant past medical, surgical, family and social history reviewed and updated as indicated. Interim medical history since our last visit reviewed. Allergies and medications  reviewed and updated. Outpatient Medications Prior to Visit  Medication Sig Dispense Refill   aspirin  81 MG EC tablet Take 1 tablet (81 mg total) by mouth daily.     calcium  carbonate (CALCIUM  600) 600 MG TABS tablet Take 1 tablet (600 mg total) by mouth daily with breakfast.     Continuous Glucose Sensor (DEXCOM G7 SENSOR) MISC CHANGE SENSOR EVERY 10 DAYS 9 each 1   esomeprazole  (NEXIUM ) 20 MG capsule Take 40 mg by mouth daily.     Evolocumab  (REPATHA  SURECLICK) 140 MG/ML SOAJ Inject 140 mg into the skin every 14 (fourteen) days. 6 mL 3   Glucagon  3 MG/DOSE POWD Place 3 mg into the nose once as needed for up to 1 dose. 1 each 11   insulin  NPH Human (HUMULIN N) 100 UNIT/ML injection Inject 0.08 mLs (8 Units total) into the skin at bedtime. 10 mL 5   insulin  regular (HUMULIN R ) 100 units/mL injection Inject 0.01-0.06 mLs (1-6 Units total) into the skin 3 (three) times daily before meals. As instructed per sliding scale 10 mL 3   isosorbide  mononitrate (IMDUR ) 30 MG 24 hr tablet Take 1 tablet (30 mg total) by mouth daily. 90 tablet 3   metoprolol  succinate (TOPROL -XL) 25 MG 24 hr tablet TAKE 1 TABLET (25 MG TOTAL) BY MOUTH DAILY. 90 tablet 3   Multiple Vitamin (MULTIVITAMIN) tablet Take  1 tablet by mouth daily.     nitroGLYCERIN  (NITROSTAT ) 0.4 MG SL tablet Place 1 tablet (0.4 mg total) under the tongue every 5 (five) minutes as needed for chest pain. 25 tablet 3   Omega-3 Fatty Acids (FISH OIL) 500 MG CAPS Take 500 mg by mouth daily.     TECHLITE INSULIN  SYRINGE 31G X 5/16" 0.3 ML MISC USE UP TO 5 TIMES DAILY TO INJECT INSULIN  DAILY 300 each 1   ranolazine  (RANEXA ) 1000 MG SR tablet Take 1 tablet (1,000 mg total) by mouth 2 (two) times daily. 180 tablet 3   tolterodine  (DETROL ) 2 MG tablet Take 2 mg by mouth daily.     colchicine  0.6 MG tablet Take 1 tablet (0.6 mg total) by mouth 2 (two) times daily as needed (foot pain).     mirabegron ER (MYRBETRIQ) 25 MG TB24 tablet Take 25 mg by mouth daily.  (Patient not taking: Reported on 03/10/2024)     ranolazine  (RANEXA ) 500 MG 12 hr tablet Take 1 tablet (500 mg total) by mouth 2 (two) times daily for 7 days. 14 tablet 0   No facility-administered medications prior to visit.     Per HPI unless specifically indicated in ROS section below Review of Systems  Objective:  BP 129/73 (BP Location: Right Arm, Cuff Size: Normal) Comment: at home this morning  Pulse (!) 55   Temp 98 F (36.7 C) (Oral)   Ht 5\' 4"  (1.626 m)   Wt 136 lb (61.7 kg)   SpO2 98%   BMI 23.34 kg/m   Wt Readings from Last 3 Encounters:  03/10/24 136 lb (61.7 kg)  01/14/24 135 lb 6.4 oz (61.4 kg)  11/24/23 133 lb (60.3 kg)      Physical Exam Vitals and nursing note reviewed.  Constitutional:      Appearance: Normal appearance. She is not ill-appearing.  Musculoskeletal:        General: Tenderness present. Normal range of motion.     Right lower leg: No edema.     Left lower leg: No edema.     Comments:  2+ DP bilaterally  L foot:  No skin rash, erythema, significant edema  Point tender to palpation across 2nd/3rd MT shafts as well as anterior ankle without pain with squeezing of lower leg No pain at medial/lateral malleoli or pain along medial ankle tendons  No pain at achilles insertion or at heel No pain at base of 5th MT  R foot:  WNL  + collapse of longitudinal arch bilaterally with standing  Skin:    General: Skin is warm and dry.     Findings: No rash.  Neurological:     Mental Status: She is alert.  Psychiatric:        Mood and Affect: Mood normal.        Behavior: Behavior normal.        Assessment & Plan:   Problem List Items Addressed This Visit     Left foot pain - Primary   Tender to palpation along 2nd/3rd MT shafts as well as anterior ankle.  Check xrays r/o stress fracture - no obvious fracture, will await radiology read.  Limited therapeutic options in h/o T1DM and CAD - likely avoid NSAIDs, prednisone .  Colchicine  hasn't  been too effective.  Declines Rx Tramadol .  She declines postop shoe use - states this previously worsened symptoms.  She will continue tylenol . Rec ankle and midfoot sleeve for increased support, discussed importance of supportive shoewear.  Rec sports med f/u vs advanced imaging if not improving with above.       Relevant Orders   DG Foot Complete Left (Completed)   DG Ankle Complete Left (Completed)   Urge urinary incontinence   Followed by GYN - who's recently retired. Requests Detrol  refill until she establishes with new GYN.       Relevant Medications   tolterodine  (DETROL ) 2 MG tablet   Osteoporosis   She is only on calcium  supplements, not on bone strengthening medication. Was worried about effect on heart of pharmacotherapy for osteoporosis. I think it would be ok to use Prolia - recommend she touch base with cardiology to ensure no concerns with this med.         Meds ordered this encounter  Medications   tolterodine  (DETROL ) 2 MG tablet    Sig: Take 1 tablet (2 mg total) by mouth daily.    Dispense:  90 tablet    Refill:  1    Orders Placed This Encounter  Procedures   DG Foot Complete Left    Standing Status:   Future    Number of Occurrences:   1    Expiration Date:   03/10/2025    Reason for Exam (SYMPTOM  OR DIAGNOSIS REQUIRED):   chronic L foot pain eval MT stress fracture    Preferred imaging location?:   [facility]   DG Ankle Complete Left    Standing Status:   Future    Number of Occurrences:   1    Expiration Date:   03/10/2025    Reason for Exam (SYMPTOM  OR DIAGNOSIS REQUIRED):   chronic left foot and anterior ankle pain without trauma    Preferred imaging location?:   [facility]    Patient Instructions  Xrays today - no obvious fracture. Will await formal radiology read.  Limited treatment options. We could consider tramadol  prescription for pain.  Use ankle sleeve, supportive firm shoes.  Let us  know if worsening to consider  further imaging vs return to Dr Geralyn Knee for re-evaluation. Good to see you today.  I have refilled Detrol  for you.   Follow up plan: Return if symptoms worsen or fail to improve.  Claire Crick, MD

## 2024-03-10 NOTE — Assessment & Plan Note (Signed)
 She is only on calcium  supplements, not on bone strengthening medication. Was worried about effect on heart of pharmacotherapy for osteoporosis. I think it would be ok to use Prolia - recommend she touch base with cardiology to ensure no concerns with this med.

## 2024-03-10 NOTE — Assessment & Plan Note (Signed)
 Followed by GYN - who's recently retired. Requests Detrol refill until she establishes with new GYN.

## 2024-03-11 ENCOUNTER — Encounter: Payer: Self-pay | Admitting: Cardiovascular Disease

## 2024-03-12 ENCOUNTER — Encounter: Payer: Self-pay | Admitting: Family Medicine

## 2024-03-14 NOTE — Progress Notes (Unsigned)
 Cardiology Office Note:  .   Date:  03/15/2024  ID:  Felicia [name], DOB 1960/12/04, MRN [id number] PCP: Claire Crick, MD  [facility] Providers Cardiologist:  Luana Rumple, MD {  History of Present Illness: .   Felicia [name] is a 63 y.o. female CAD status post CABG in April 2021 with noted vein graft occlusion but patent LIMA to LAD, type 1 diabetes on insulin , hyperlipidemia, intolerant to statins on PCSK9 inhibitor.  Patient with history of CABG whose postoperative course complicated by episode of syncope with fall and right orbital fracture and wrist fracture. Cardiac catheterization in 2021 showing early failure 4 out of 5 grafts but patent LIMA to LAD.  Most recent cardiac catheterization in November 2024 with only 1 out of 4 patent bypass grafts with very severe LAD stenosis with mid to distal filling from the patent LIMA to LAD.  Otherwise other CAD too small for PCI.  Since then she has had continued problems of shortness of breath and chest pain only specific activities like climbing the hill towards her church, but exercising at home on a treadmill at an incline she is asymptomatic.  Antianginals have been titrated up, limited by low blood pressure.  Ranexa  and Imdur  started at last visit in March.  Today patient reports that she had to stop the Ranexa  due to complaints of lightheadedness and dizziness.  She staggered her initiation of Ranexa  and Imdur  so feels the Ranexa  is the obvious culprit.  Still continues to have shortness of breath and pauses with activity however still very active and getting 8-10,000 steps a day and exercises on a treadmill for 30 minutes daily.  Chest pain is not her main complaint.  Not ideal but overall content with current therapy and does not want to make any other changes currently.   ROS: Positive shortness of breath, positive lightheadedness, no peripheral edema, orthopnea, palpitations,  Studies Reviewed: .   Limited echocardiogram  06/12/2020 1. Left ventricular ejection fraction, by estimation, is 55 to 60%. The  left ventricle has normal function. The left ventricle has no regional  wall motion abnormalities. Left ventricular diastolic parameters are  consistent with Grade II diastolic  dysfunction (pseudonormalization).   2. Right ventricular systolic function is normal. The right ventricular  size is normal.   3. The mitral valve is normal in structure. Mild mitral valve  regurgitation. No evidence of mitral stenosis.   4. The aortic valve is tricuspid. Aortic valve regurgitation is not  visualized. Mild aortic valve sclerosis is present, with no evidence of  aortic valve stenosis.   5. The inferior vena cava is normal in size with greater than 50%  respiratory variability, suggesting right atrial pressure of 3 mmHg.   Comparison(s): No significant change from prior study. Prior images  reviewed side by side.   Diagnostic cardiac catheterization 09/12/2023  Severe three vessel CAD s/p 4V CABG with 1/4 patent bypass grafts The LAD has severe mid stenosis. The mid and distal LAD fills from the patent LIMA. The small caliber diagonal branch has severe diffuse disease, not a target for PCI. The vein graft to the diagonal branch is known to be occluded and was not injected.  The Circumflex has moderate non-flow limiting disease. The vein graft to the obtuse marginal branch is known to be occluded and was not injected.  The large dominant RCA has moderate, heavily calcified stenosis in the proximal and mid vessel. Diffuse disease in the small caliber PDA. The vein  graft to the posterolateral artery and PDA is known to be occluded and was not injected.  Pressure wire analysis of the RCA with RFR of 0.93 suggesting the lesion in the mid RCA is not flow limiting.  Normal LVEDP   Recommendations: Continue medical management of CAD.     Dominance: Right   Risk Assessment/Calculations: .        Physical Exam:   VS:   BP (!) 180/80 (BP Location: Left Arm, Patient Position: Sitting, Cuff Size: Normal)   Pulse (!) 58   Ht 5\' 1"  (1.549 m)   Wt 135 lb (61.2 kg)   SpO2 98%   BMI 25.51 kg/m    Wt Readings from Last 3 Encounters:  03/15/24 135 lb (61.2 kg)  03/10/24 136 lb (61.7 kg)  01/14/24 135 lb 6.4 oz (61.4 kg)    GEN: Well nourished, well developed in no acute distress NECK: No JVD; No carotid bruits CARDIAC: RRR, no murmurs, rubs, gallops RESPIRATORY:  Clear to auscultation without rales, wheezing or rhonchi  ABDOMEN: Soft, non-tender, non-distended EXTREMITIES:  No edema; No deformity   ASSESSMENT AND PLAN: .    CAD status post CABG with early graft failure Documented since catheterization in 2021.  Most recent catheterization November 2024 with only 1 out of 4 grafts patent, LIMA to LAD.  No options for PCI.  RCA still has decent flow.  Shortness of breath is her main complaint, current therapy okay and current functional status is tolerable. Continue with aspirin , metoprolol  XL 25 mg, Imdur  30 mg.  Did not tolerate Ranexa  due to lightheadedness/dizziness. We discussed repeating echo since her last was in 2021, this would be more to be thorough and to rule out secondary causes but does not exhibit any CHF symptoms.  She would prefer to defer decision to Dr. Alvis Ba if this is needed.  Hyperlipidemia (statin intolerant) On Repatha .  LDL 61 November 2024.  Type 1 diabetes Followed by Dr. Aldona Amel in the endocrinology clinic.  A1c 6.6 10/2023  Hypertension Suspect whitecoat syndrome.  She has a blood pressure log that has adequately controlled BP.  Reports severe anxiety during these visits.  Will not initiate any therapy right now.  Continue to log BP.  Osteoporosis. Okay to start Prolia, do not see any interactions with any of her meds  Dispo: 6 months with primary MD.  Signed, Burnetta Cart, PA-C

## 2024-03-15 ENCOUNTER — Ambulatory Visit: Attending: Nurse Practitioner | Admitting: Cardiology

## 2024-03-15 ENCOUNTER — Encounter: Payer: Self-pay | Admitting: Cardiovascular Disease

## 2024-03-15 VITALS — BP 180/80 | HR 58 | Ht 61.0 in | Wt 135.0 lb

## 2024-03-15 DIAGNOSIS — E108 Type 1 diabetes mellitus with unspecified complications: Secondary | ICD-10-CM | POA: Diagnosis not present

## 2024-03-15 DIAGNOSIS — E782 Mixed hyperlipidemia: Secondary | ICD-10-CM | POA: Diagnosis not present

## 2024-03-15 DIAGNOSIS — I25708 Atherosclerosis of coronary artery bypass graft(s), unspecified, with other forms of angina pectoris: Secondary | ICD-10-CM | POA: Diagnosis not present

## 2024-03-15 NOTE — Patient Instructions (Signed)
 Medication Instructions:  Your physician recommends that you continue on your current medications as directed. Please refer to the Current Medication list given to you today.  *If you need a refill on your cardiac medications before your next appointment, please call your pharmacy*  Lab Work: NONE ordered at this time of appointment   Testing/Procedures: NONE ordered at this time of appointment   Follow-Up: At [facility], you and your health needs are our priority.  As part of our continuing mission to provide you with exceptional heart care, our providers are all part of one team.  This team includes your primary Cardiologist (physician) and Advanced Practice Providers or APPs (Physician Assistants and Nurse Practitioners) who all work together to provide you with the care you need, when you need it.  Your next appointment:   6 month(s)  Provider:   Luana Rumple, MD    We recommend signing up for the patient portal called "MyChart".  Sign up information is provided on this After Visit Summary.  MyChart is used to connect with patients for Virtual Visits (Telemedicine).  Patients are able to view lab/test results, encounter notes, upcoming appointments, etc.  Non-urgent messages can be sent to your provider as well.   To learn more about what you can do with MyChart, go to [email or web address].

## 2024-03-17 ENCOUNTER — Telehealth: Payer: Self-pay | Admitting: Pharmacy Technician

## 2024-03-17 NOTE — Telephone Encounter (Signed)
 Pharmacy Patient Advocate Encounter   Received notification from CoverMyMeds that prior authorization for repatha  is required/requested.   Insurance verification completed.   The patient is insured through [facility] .   Per test claim: PA required; PA submitted to above mentioned insurance via CoverMyMeds Key/confirmation #/EOC [id number] Status is pending

## 2024-03-18 ENCOUNTER — Other Ambulatory Visit (HOSPITAL_COMMUNITY): Payer: Self-pay

## 2024-03-18 ENCOUNTER — Telehealth: Payer: Self-pay | Admitting: Pharmacy Technician

## 2024-03-18 ENCOUNTER — Encounter: Payer: Self-pay | Admitting: Family Medicine

## 2024-03-18 NOTE — Telephone Encounter (Signed)
 Spoke to patient and she is aware and wants 90 days of repatha  -[facility] aware

## 2024-03-18 NOTE — Telephone Encounter (Addendum)
 Pharmacy Patient Advocate Encounter  Received notification from [facility] that Prior Authorization for REPATHA  has been APPROVED from 03/17/24 to 03/17/25. Spoke to pharmacy to process.Copay is $180.00 FOR 90 DAYS.    PA #/Case ID/Reference #: [id number]

## 2024-03-18 NOTE — Telephone Encounter (Signed)
   [facility] said this rejected saying she has used her max on coupons

## 2024-03-20 ENCOUNTER — Other Ambulatory Visit: Payer: Self-pay | Admitting: Internal Medicine

## 2024-03-24 ENCOUNTER — Other Ambulatory Visit (HOSPITAL_COMMUNITY): Payer: Self-pay

## 2024-03-24 ENCOUNTER — Telehealth: Payer: Self-pay

## 2024-03-24 ENCOUNTER — Telehealth: Payer: Self-pay | Admitting: Family Medicine

## 2024-03-24 NOTE — Telephone Encounter (Signed)
 Pharmacy Patient Advocate Encounter   Received notification from CoverMyMeds that prior authorization for Dexcom G7 Sensor is required/requested.   Insurance verification completed.   The patient is insured through [facility] .   Per test claim: PA required; PA submitted to above mentioned insurance via CoverMyMeds Key/confirmation #/EOC [id number] Status is pending

## 2024-03-24 NOTE — Telephone Encounter (Signed)
 Copied from CRM [id number]. Topic: General - Other >> Mar 23, 2024  3:45 PM Martin[place] E wrote: Reason for CRM: Patient called in regarding her flexible spending account and accidentally used the wrong card at time of visit on May 7th. Patient questioning if she can have an itemized receipt sent to her email on file with the date of visit, name, CPT codes and the amount. Callback number for patient is [phone].

## 2024-03-24 NOTE — Telephone Encounter (Signed)
 Lvm and my chart message for the patient to contact billing for itemized statement at [phone]

## 2024-03-25 NOTE — Telephone Encounter (Signed)
 Pharmacy Patient Advocate Encounter  Received notification from [facility] that Prior Authorization for Dexcom G7 Sensor has been APPROVED from 03-24-2024 to 03-24-2025   PA #/Case ID/Reference #: [id number]

## 2024-03-27 ENCOUNTER — Other Ambulatory Visit: Payer: Self-pay | Admitting: Internal Medicine

## 2024-04-07 ENCOUNTER — Other Ambulatory Visit: Payer: Self-pay | Admitting: Internal Medicine

## 2024-04-07 DIAGNOSIS — H43812 Vitreous degeneration, left eye: Secondary | ICD-10-CM | POA: Diagnosis not present

## 2024-04-12 ENCOUNTER — Encounter: Payer: Self-pay | Admitting: Family Medicine

## 2024-04-12 ENCOUNTER — Ambulatory Visit: Admitting: Family Medicine

## 2024-04-12 ENCOUNTER — Ambulatory Visit: Payer: Managed Care, Other (non HMO) | Admitting: Internal Medicine

## 2024-04-12 VITALS — BP 136/80 | HR 61 | Temp 97.8°F | Ht 61.0 in

## 2024-04-12 DIAGNOSIS — N390 Urinary tract infection, site not specified: Secondary | ICD-10-CM | POA: Diagnosis not present

## 2024-04-12 DIAGNOSIS — E1059 Type 1 diabetes mellitus with other circulatory complications: Secondary | ICD-10-CM | POA: Diagnosis not present

## 2024-04-12 DIAGNOSIS — R3129 Other microscopic hematuria: Secondary | ICD-10-CM

## 2024-04-12 DIAGNOSIS — J22 Unspecified acute lower respiratory infection: Secondary | ICD-10-CM | POA: Diagnosis not present

## 2024-04-12 DIAGNOSIS — M810 Age-related osteoporosis without current pathological fracture: Secondary | ICD-10-CM

## 2024-04-12 LAB — URINALYSIS, ROUTINE W REFLEX MICROSCOPIC
Bilirubin Urine: NEGATIVE
Hgb urine dipstick: NEGATIVE
Ketones, ur: NEGATIVE
Leukocytes,Ua: NEGATIVE
Nitrite: NEGATIVE
RBC / HPF: NONE SEEN (ref 0–?)
Specific Gravity, Urine: 1.01 (ref 1.000–1.030)
Total Protein, Urine: NEGATIVE
Urine Glucose: NEGATIVE
Urobilinogen, UA: 0.2 (ref 0.0–1.0)
pH: 6 (ref 5.0–8.0)

## 2024-04-12 MED ORDER — ALENDRONATE SODIUM 70 MG PO TABS: 70.0000 mg | ORAL_TABLET | ORAL | 0 refills | Status: DC

## 2024-04-12 MED ORDER — AZITHROMYCIN 250 MG PO TABS: ORAL_TABLET | ORAL | 0 refills | Status: DC

## 2024-04-12 NOTE — Assessment & Plan Note (Signed)
 Given duration and comorbidities (T1DM), will cover for bacterial/atypical bronchitis with azithromycin  antibiotic. Continue OTC cough remedies.  Continue further supportive measures. No fever today, good O2 sats, lungs clear - not consistent with PNA.  Update if not improving with treatment.

## 2024-04-12 NOTE — Assessment & Plan Note (Signed)
 Again reviewed treatment options as well as mechanism of action.  I do recommend prolia vs IV bisphsphonate - she is not interested in IV or IM treatment with prolonged duration of action but prefers to try oral treatments first.  Discussed concerns with her history of diabetic gastroparesis - she states this is well controlled on nexium  BID and desires to try oral treatment regardless.  Will Rx fosamax 70mg  weekly x 1-3 months, she will update me with tolerance and after 3 month course will re-consider IV treatment.  Reviewed risk of bisphosphonate and atypical hip fractures past 5 yrs of use, as well as possible jaw bone complications.

## 2024-04-12 NOTE — Assessment & Plan Note (Signed)
 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  She did have hematuria at that time - will update UA today to ensure hematuria has cleared.

## 2024-04-12 NOTE — Patient Instructions (Addendum)
 You have an upper respiratory infection. Given duration, treat with zpack antibiotic.  May continue sugar free cough drop, OTC diabetic cough remedy.   Push fluids and plenty of rest. Please return if you are not improving as expected, or if you have high fevers (>101.5) or difficulty swallowing or worsening productive cough. Call clinic with questions.  Good to see you today. I hope you start feeling better soon.   Try fosamax weekly for the next month and update me with how you do with this.  Administer first thing in the morning and >=30 minutes before the first food, beverage (except plain water), or other medication(s) of the day. Do not take with mineral water or with other beverages. Stay upright (not to lie down) for >=30 minutes and until after first food of the day (to reduce esophageal irritation). Tablet (Fosamax): Must be taken with 6 to 8 oz of plain water. The tablet should be swallowed whole; do not chew or suck.  Urinalysis today.

## 2024-04-12 NOTE — Progress Notes (Signed)
 Ph: [phone] Fax: [phone]   Patient ID: Felicia [name], female    DOB: July 08, 1961, 63 y.o.   MRN: [id number]  This visit was conducted in person.  BP 136/80   Pulse 61   Temp 97.8 F (36.6 C) (Oral)   Ht 5\' 1"  (1.549 m)   SpO2 99%   BMI 25.51 kg/m    CC: productive cough with mucous  Subjective:   HPI: Felicia [name] is a 63 y.o. female presenting on 04/12/2024 for Cough (C/o bad prod cough w/ yellow mucus. Sxs started about 6 wks ago. Pt accompanied by granddaughter, Felicia [name]. )   6 wk h/o cough productive of yellow mucous, associated with coughing fits. Significant nasal congestion, some chest discomfort with cough.  She tried allegra - didn't do well with this (jittery, felt bad). Initial ear pain, now better. PNDrainage worse in am. Head > chest congestion. Some frontal sinus headache.  Initially symptoms worse but persistent.  Sleeping ok.   No fevers/chills, tooth pain, ST, abd pain, nausea/vomiting.  Managing with nasal saline, afrin temporarily, mucinex  with temporarily benefit.  Upcoming vacation to [place].   OP - see mychart message. She discussed options with cardiology who suggested weekly fosamax however in h/o diabetic gastroparesis don't recommend oral medication. Discussed IV boniva Q3 months.      Relevant past medical, surgical, family and social history reviewed and updated as indicated. Interim medical history since our last visit reviewed. Allergies and medications reviewed and updated. Outpatient Medications Prior to Visit  Medication Sig Dispense Refill   aspirin  81 MG EC tablet Take 1 tablet (81 mg total) by mouth daily.     calcium  carbonate (CALCIUM  600) 600 MG TABS tablet Take 1 tablet (600 mg total) by mouth daily with breakfast.     colchicine  0.6 MG tablet Take 1 tablet (0.6 mg total) by mouth 2 (two) times daily as needed (foot pain).     Continuous Glucose Sensor (DEXCOM G7 SENSOR) MISC CHANGE SENSOR EVERY 10 DAYS 9 each 1    esomeprazole  (NEXIUM ) 20 MG capsule Take 40 mg by mouth daily.     Evolocumab  (REPATHA  SURECLICK) 140 MG/ML SOAJ Inject 140 mg into the skin every 14 (fourteen) days. 6 mL 3   Glucagon  3 MG/DOSE POWD Place 3 mg into the nose once as needed for up to 1 dose. 1 each 11   HUMULIN R  100 UNIT/ML injection INJECT 0.01-0.06 MLS (1-6 UNITS TOTAL) INTO THE SKIN 3 (THREE) TIMES DAILY BEFORE MEALS. AS INSTRUCTED PER SLIDING SCALE 30 mL 1   insulin  NPH Human (HUMULIN N) 100 UNIT/ML injection Inject 0.08 mLs (8 Units total) into the skin at bedtime. 10 mL 5   isosorbide  mononitrate (IMDUR ) 30 MG 24 hr tablet Take 1 tablet (30 mg total) by mouth daily. 90 tablet 3   metoprolol  succinate (TOPROL -XL) 25 MG 24 hr tablet TAKE 1 TABLET (25 MG TOTAL) BY MOUTH DAILY. 90 tablet 3   Multiple Vitamin (MULTIVITAMIN) tablet Take 1 tablet by mouth daily.     nitroGLYCERIN  (NITROSTAT ) 0.4 MG SL tablet Place 1 tablet (0.4 mg total) under the tongue every 5 (five) minutes as needed for chest pain. 25 tablet 3   Omega-3 Fatty Acids (FISH OIL) 500 MG CAPS Take 500 mg by mouth daily.     TECHLITE INSULIN  SYRINGE 31G X 5/16" 0.3 ML MISC USE UP TO 5 TIMES DAILY TO INJECT INSULIN  DAILY 300 each 1   tolterodine  (DETROL ) 2 MG tablet Take  1 tablet (2 mg total) by mouth daily. 90 tablet 1   No facility-administered medications prior to visit.     Per HPI unless specifically indicated in ROS section below Review of Systems  Objective:  BP 136/80   Pulse 61   Temp 97.8 F (36.6 C) (Oral)   Ht 5\' 1"  (1.549 m)   SpO2 99%   BMI 25.51 kg/m   Wt Readings from Last 3 Encounters:  03/15/24 135 lb (61.2 kg)  03/10/24 136 lb (61.7 kg)  01/14/24 135 lb 6.4 oz (61.4 kg)      Physical Exam Vitals and nursing note reviewed.  Constitutional:      Appearance: Normal appearance. She is not ill-appearing.  HENT:     Head: Normocephalic and atraumatic.     Right Ear: Ear canal and external ear normal. There is impacted cerumen.      Left Ear: Tympanic membrane, ear canal and external ear normal. There is no impacted cerumen.     Nose:     Right Sinus: No maxillary sinus tenderness or frontal sinus tenderness.     Left Sinus: No maxillary sinus tenderness or frontal sinus tenderness.     Mouth/Throat:     Mouth: Mucous membranes are moist.     Pharynx: Oropharynx is clear. No oropharyngeal exudate or posterior oropharyngeal erythema.  Eyes:     Extraocular Movements: Extraocular movements intact.     Conjunctiva/sclera: Conjunctivae normal.     Pupils: Pupils are equal, round, and reactive to light.  Cardiovascular:     Rate and Rhythm: Normal rate and regular rhythm.     Pulses: Normal pulses.     Heart sounds: Normal heart sounds. No murmur heard. Pulmonary:     Effort: Pulmonary effort is normal. No respiratory distress.     Breath sounds: Normal breath sounds. No wheezing, rhonchi or rales.     Comments: Lungs clear Lymphadenopathy:     Head:     Right side of head: No submental, submandibular, tonsillar, preauricular or posterior auricular adenopathy.     Left side of head: No submental, submandibular, tonsillar, preauricular or posterior auricular adenopathy.     Cervical: No cervical adenopathy.     Right cervical: No superficial cervical adenopathy.    Left cervical: No superficial cervical adenopathy.     Upper Body:     Right upper body: No supraclavicular adenopathy.     Left upper body: No supraclavicular adenopathy.  Skin:    Findings: No rash.  Neurological:     Mental Status: She is alert.  Psychiatric:        Mood and Affect: Mood normal.        Behavior: Behavior normal.       Lab Results  Component Value Date   COLORU yellow 10/17/2023   CLARITYU cloudy 10/17/2023   GLUCOSEUR Negative 10/17/2023   BILIRUBINUR negative 10/17/2023   KETONESU negative 10/17/2023   SPECGRAV 1.015 10/17/2023   RBCUR 3+ 10/17/2023   PHUR 6.5 10/17/2023   PROTEINUR Positive (A) 10/17/2023    UROBILINOGEN 0.2 10/17/2023   LEUKOCYTESUR Large (3+) (A) 10/17/2023   Lab Results  Component Value Date   TSH 2.85 09/24/2023    Assessment & Plan:   Problem List Items Addressed This Visit     Type 1 diabetes mellitus with circulatory complication, with long-term current use of insulin  (HCC)   Acute respiratory infection - Primary   Given duration and comorbidities (T1DM), will cover for bacterial/atypical bronchitis with  azithromycin  antibiotic. Continue OTC cough remedies.  Continue further supportive measures. No fever today, good O2 sats, lungs clear - not consistent with PNA.  Update if not improving with treatment.       Relevant Medications   azithromycin  (ZITHROMAX ) 250 MG tablet   Osteoporosis   Again reviewed treatment options as well as mechanism of action.  I do recommend prolia vs IV bisphsphonate - she is not interested in IV or IM treatment with prolonged duration of action but prefers to try oral treatments first.  Discussed concerns with her history of diabetic gastroparesis - she states this is well controlled on nexium  BID and desires to try oral treatment regardless.  Will Rx fosamax 70mg  weekly x 1-3 months, she will update me with tolerance and after 3 month course will re-consider IV treatment.  Reviewed risk of bisphosphonate and atypical hip fractures past 5 yrs of use, as well as possible jaw bone complications.      Relevant Medications   alendronate (FOSAMAX) 70 MG tablet   Recurrent UTI   H/o recurrent urethritis 10/2023 x2 - with resolution of symptoms after cipro  course.  She did have hematuria at that time - will update UA today to ensure hematuria has cleared.       Relevant Medications   azithromycin  (ZITHROMAX ) 250 MG tablet   Other Visit Diagnoses       Microhematuria       Relevant Orders   Urinalysis, Routine w reflex microscopic   POCT Urinalysis Dipstick (Automated)        Meds ordered this encounter  Medications    azithromycin  (ZITHROMAX ) 250 MG tablet    Sig: Take two tablets on day one followed by one tablet on days 2-5    Dispense:  6 each    Refill:  0   alendronate (FOSAMAX) 70 MG tablet    Sig: Take 1 tablet (70 mg total) by mouth every 7 (seven) days. Take with a full glass of water on an empty stomach.    Dispense:  12 tablet    Refill:  0    Orders Placed This Encounter  Procedures   Urinalysis, Routine w reflex microscopic   POCT Urinalysis Dipstick (Automated)    Patient Instructions  You have an upper respiratory infection. Given duration, treat with zpack antibiotic.  May continue sugar free cough drop, OTC diabetic cough remedy.   Push fluids and plenty of rest. Please return if you are not improving as expected, or if you have high fevers (>101.5) or difficulty swallowing or worsening productive cough. Call clinic with questions.  Good to see you today. I hope you start feeling better soon.   Try fosamax weekly for the next month and update me with how you do with this.  Administer first thing in the morning and >=30 minutes before the first food, beverage (except plain water), or other medication(s) of the day. Do not take with mineral water or with other beverages. Stay upright (not to lie down) for >=30 minutes and until after first food of the day (to reduce esophageal irritation). Tablet (Fosamax): Must be taken with 6 to 8 oz of plain water. The tablet should be swallowed whole; do not chew or suck.  Urinalysis today.   Follow up plan: Return if symptoms worsen or fail to improve.  Claire Crick, MD

## 2024-04-13 ENCOUNTER — Ambulatory Visit: Payer: Self-pay | Admitting: Family Medicine

## 2024-04-13 MED ORDER — ALENDRONATE SODIUM 70 MG PO TABS: 70.0000 mg | ORAL_TABLET | ORAL | 0 refills | Status: DC

## 2024-04-15 ENCOUNTER — Encounter: Payer: Self-pay | Admitting: Internal Medicine

## 2024-04-15 ENCOUNTER — Telehealth: Payer: Self-pay

## 2024-04-15 ENCOUNTER — Ambulatory Visit: Admitting: Internal Medicine

## 2024-04-15 VITALS — BP 120/70 | HR 56 | Ht 61.0 in | Wt 137.8 lb

## 2024-04-15 DIAGNOSIS — E785 Hyperlipidemia, unspecified: Secondary | ICD-10-CM | POA: Diagnosis not present

## 2024-04-15 DIAGNOSIS — E1069 Type 1 diabetes mellitus with other specified complication: Secondary | ICD-10-CM

## 2024-04-15 DIAGNOSIS — E103293 Type 1 diabetes mellitus with mild nonproliferative diabetic retinopathy without macular edema, bilateral: Secondary | ICD-10-CM | POA: Diagnosis not present

## 2024-04-15 LAB — POCT GLYCOSYLATED HEMOGLOBIN (HGB A1C): Hemoglobin A1C: 6.2 % — AB (ref 4.0–5.6)

## 2024-04-15 MED ORDER — INSULIN NPH (HUMAN) (ISOPHANE) 100 UNIT/ML ~~LOC~~ SUSP: SUBCUTANEOUS | 11 refills | Status: DC

## 2024-04-15 NOTE — Telephone Encounter (Signed)
 Please cancel IV ibandronate - pt desires to start with oral fosamax (alendronate)

## 2024-04-15 NOTE — Patient Instructions (Addendum)
 Please continue: - NPH insulin  10 units in the evening (try to add 3-4 units in am, too) - R insulin : B'fast: 5:1 (if going for a walk: 10:1) Lunch: 15:1 Dinner: 25:1  - target 150 - insulin  sensitivity factor (ISF) 60: 150-200: + 1 unit 201-260: + 2 units 261-320: + 3 units 321-380: + 4 units >380: + 5 units Use half of these amounts if you have to keep correcting. Do not correct bedtime sugars <300, and only inject 1 unit then.  For the snack at night, do not take more than 1-2 units of regular insulin !   Please return in 4-6 months.

## 2024-04-15 NOTE — Progress Notes (Signed)
 Patient ID: Felicia [name], female   DOB: 1961-02-06, 63 y.o.   MRN: [id number]  HPI: Felicia [name] is a 63 y.o.-year-old female, presenting for follow-up for DM1, dx in 58 (age 55), uncontrolled, with complications (CAD - s/p 5v CABG, mild retinopathy, gastroparesis). Last visit 6 months ago.  Interim hx: No increased urination, blurry vision, nausea, chest pain.   She complains of the need to correct high blood sugars especially in the first half of the day with subsequent lower blood sugars later in the day.  To correct these low blood sugars, she is drinking juice and soda.  She feels she gained 5 pounds since last visit (3 lbs per our scale).  Insulin  pump: -  >> stopped 02/2022  CGM: - Dexcom G6 >> G7  Reviewed HbA1c levels: Lab Results  Component Value Date   HGBA1C 6.6 (A) 10/13/2023   HGBA1C 6.3 05/02/2023   HGBA1C 6.6 (A) 11/01/2022   HGBA1C 7.1 (A) 06/28/2022   HGBA1C 7.3 (A) 03/18/2022   HGBA1C 6.9 (A) 11/30/2021   HGBA1C 7.6 (H) 10/05/2021   HGBA1C 7.9 (H) 06/11/2021   HGBA1C 7.5 (A) 11/30/2020   HGBA1C 7.0 (A) 07/28/2020   HGBA1C 8.9 (H) 02/17/2020   HGBA1C 8.5 (H) 09/23/2019   HGBA1C 9.0 (H) 06/17/2019   HGBA1C 8.0 (A) 09/04/2018   HGBA1C 7.7 (A) 04/17/2018   HGBA1C 7.5 12/16/2017   HGBA1C 7.8 08/14/2017   HGBA1C 7.5 04/08/2017   HGBA1C 7.5 10/29/2016   HGBA1C 7.1 07/30/2016   HGBA1C 7.9 05/06/2016   HGBA1C 7.3 12/28/2015   HGBA1C 7.6 09/27/2015   HGBA1C 7.7 (H) 06/07/2015   HGBA1C 7.5 (H) 03/23/2015   She is on: - NPH 4  units at night (1-2 am) -added 03/2022 >> 6 >> 7 units at 9-10 pm >> 8 >> 9 (may need 10) >> 8 >> 9 >> 10 units in the evening - R insulin : B'fast: 5:1 (if going for a walk: 10:1) Lunch: 15:1 Dinner: 25:1 (may need 20:1) - target 150 - insulin  sensitivity factor (ISF) 60: 150-200: + 1 unit 201-260: + 2 units 261-320: + 3 units 321-380: + 4 units >380: + 5 units Do not correct bedtime sugars <300, and only inject 1 unit then.   For the snack at night, do not take more than 1-2 units of regular insulin !  Tried Metformin  ER 1000 mg with dinner but stopped due to lack of effect. Tried Tresiba  >> she did not feel that this worked as well as Lantus . Tried Toujeo  >> allergy: CP, SOB. She was previously on insulin  pump for 5 years (~2010) but she did not like it.  Sugars were not better on this.    She was on an OmniPod 5 insulin  pump but felt her sugars were higher >> stopped 02/2022.  Meter: ReliOn  She checks her blood sugars more than 4 times a day with her Dexcom CGM:  Prev.:  Previously:    Lowest sugar was 38 >>...45 (CGM but 62 by glucometer) >> ... 55 >> 50s; + hypoglycemia awareness in the 70s.  No recent hyper or hypoglycemia admissions.  She has a glucagon  kit at home. Highest sugar was 500s (bagel)  ... >> 345 >> 300s >> 400s (?) >> 400s.   Pt's meals are: - Breakfast: protein bar + fruit >> granola bar (25 g carbs) - 10 am snack  - Lunch: apple + cheese, carrots - Snack bar: 15g carbs - Glucerna - Dinner: meat + 1/2 backed  potato + veggies/salad - Snacks: popcorn, salty snacks: Potato chips  -No CKD, last BUN/creatinine:  Lab Results  Component Value Date   BUN 25 (H) 09/24/2023   CREATININE 1.19 09/24/2023   Lab Results  Component Value Date   MICRALBCREAT 17.3 09/24/2023   MICRALBCREAT 2.0 08/13/2022   MICRALBCREAT 1.6 06/11/2021   MICRALBCREAT 1.8 09/17/2017   MICRALBCREAT 1.7 03/23/2015   MICRALBCREAT 0.2 11/15/2014  Previously on quinapril , now off.  + HL; last set of lipids: Lab Results  Component Value Date   CHOL 133 09/24/2023   HDL 47.10 09/24/2023   LDLCALC 61 09/24/2023   LDLDIRECT 120.0 06/17/2019   TRIG 125.0 09/24/2023   CHOLHDL 3 09/24/2023  She tried pravastatin , lovastatin , Zetia , even adding co-Q10 but she still had muscle cramps >>  now on Repatha , tolerated well.  Also has 2 creams: Theraworx + Muscle calm.    - last eye exam was on 04/2024: + DR  reportedly  - no numbness and tingling in her feet. Last foot exam: 10/13/2023.  On ASA 81.  Latest TSH is normal: Lab Results  Component Value Date   TSH 2.85 09/24/2023   She had chest pain in 2024, for which she had to take nitroglycerin .  A heart catheterization showed a coronary artery 50% block, no stent placed. She also has a history of HTN. She has a history of recurrent vaginal candidiasis.  ROS: + See HPI  I reviewed pt's medications, allergies, PMH, social hx, family hx, and changes were documented in the history of present illness. Otherwise, unchanged from my initial visit note.  Past Medical History:  Diagnosis Date   Allergy    Anemia    past history long ago   Borderline HTN (hypertension)    CAD (coronary artery disease)    a. 02/2020 Cath: LM nl, LAD large, 90p, 57m, D1 90p, LCX nl, OM 60-70p, RCA dominant w/ dampening upon engagement of ostium. 70p/m, RPDA 50. EF 55-65%.   COVID-19 virus infection 02/2021   Diabetes (HCC)    Disorder of kidney    has abd kidney (right)   GERD (gastroesophageal reflux disease)    History of diabetic gastroparesis    History of echocardiogram    a. 01/2013 Echo: EF 55-65%, no rwma. Mild MR. Nl RV fxn.   HLD (hyperlipidemia)    Hx: UTI (urinary tract infection)    IBS (irritable bowel syndrome)    Nephrolithiasis 2005   Orbital fracture (HCC) 02/26/2020   Osteopenia 2012   per pt   Radius and ulna distal fracture 02/26/2020   Reflux esophagitis    Systolic murmur 01/2013   mild mitral insuff   Type 1 diabetes mellitus with diabetic retinopathy without macular edema (HCC)    dx at 63 y/o, background retinopathy   Past Surgical History:  Procedure Laterality Date   APPENDECTOMY     CESAREAN SECTION  1985   COLONOSCOPY  07/2016   WNL (Pyrtle)   CORONARY ARTERY BYPASS GRAFT N/A 02/21/2020   Procedure: CORONARY ARTERY BYPASS GRAFTING (CABG) times five using left internal mammary and right leg saphenous vein;  Surgeon:  Hilarie Lovely, MD;  Location: [facility] OR;  Service: Open Heart Surgery;  Laterality: N/A;   CORONARY PRESSURE/FFR STUDY N/A 09/12/2023   Procedure: CORONARY PRESSURE/FFR STUDY;  Surgeon: Odie Benne, MD;  Location: [facility] INVASIVE CV LAB;  Service: Cardiovascular;  Laterality: N/A;   ESOPHAGOGASTRODUODENOSCOPY  07/2016   LA Grade C reflux esophagitis, concern for stasis/dysmotility (Pyrtle)  LAPAROSCOPIC APPENDECTOMY N/A 09/01/2015   Procedure: APPENDECTOMY LAPAROSCOPIC;  Surgeon: Jerryl Morin, MD;  Location: [facility] OR;  Service: General;  Laterality: N/A;   LEFT HEART CATH AND CORONARY ANGIOGRAPHY N/A 02/17/2020   Procedure: LEFT HEART CATH AND CORONARY ANGIOGRAPHY;  Surgeon: Devorah Fonder, MD;  Location: [facility] INVASIVE CV LAB;  Service: Cardiovascular;  Laterality: N/A;   LEFT HEART CATH AND CORS/GRAFTS ANGIOGRAPHY N/A 02/29/2020   early failure of 4/5 bypass grafts - medically managed Abel Hoe, Coy Ditty, MD)   LEFT HEART CATH AND CORS/GRAFTS ANGIOGRAPHY N/A 09/12/2023   Procedure: LEFT HEART CATH AND CORS/GRAFTS ANGIOGRAPHY;  Surgeon: Odie Benne, MD;  Location: [facility] INVASIVE CV LAB;  Service: Cardiovascular;  Laterality: N/A;   ORIF FEMUR FRACTURE Left 1996   Corrected by patient in history 2016   PARTIAL HYSTERECTOMY  2000   paps by [facility]   TEE WITHOUT CARDIOVERSION N/A 02/21/2020   Procedure: TRANSESOPHAGEAL ECHOCARDIOGRAM (TEE);  Surgeon: Hilarie Lovely, MD;  Location: [facility] OR;  Service: Open Heart Surgery;  Laterality: N/A;   TONSILLECTOMY AND ADENOIDECTOMY  1975   TRIGGER FINGER RELEASE Left 12/2010   thumb   Social History   Socioeconomic History   Marital status: Married    Spouse name: Not on file   Number of children: 1   Years of education: 16   Highest education level: Bachelor's degree (e.g., BA, AB, BS)  Occupational History   Occupation: HR Chemical engineer: OTHER    Comment: Scientist, research (medical)  Tobacco Use   Smoking  status: Never    Passive exposure: Never   Smokeless tobacco: Never  Vaping Use   Vaping status: Never Used  Substance and Sexual Activity   Alcohol use: No    Alcohol/week: 0.0 standard drinks of alcohol   Drug use: No   Sexual activity: Yes    Birth control/protection: None    Comment: Married  Other Topics Concern   Not on file  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  Has one daughter.  Education: college.      1 dog   Social Drivers of Corporate investment banker Strain: Not on file  Food Insecurity: Not on file  Transportation Needs: Not on file  Physical Activity: Not on file  Stress: Not on file  Social Connections: Not on file  Intimate Partner Violence: Not on file   Current Outpatient Medications on File Prior to Visit  Medication Sig Dispense Refill   alendronate (FOSAMAX) 70 MG tablet Take 1 tablet (70 mg total) by mouth every 7 (seven) days. Take with a full glass of water on an empty stomach. 12 tablet 0   aspirin  81 MG EC tablet Take 1 tablet (81 mg total) by mouth daily.     azithromycin  (ZITHROMAX ) 250 MG tablet Take two tablets on day one followed by one tablet on days 2-5 6 each 0   calcium  carbonate (CALCIUM  600) 600 MG TABS tablet Take 1 tablet (600 mg total) by mouth daily with breakfast.     colchicine  0.6 MG tablet Take 1 tablet (0.6 mg total) by mouth 2 (two) times daily as needed (foot pain).     Continuous Glucose Sensor (DEXCOM G7 SENSOR) MISC CHANGE SENSOR EVERY 10 DAYS 9 each 1   esomeprazole  (NEXIUM ) 20 MG capsule Take 40 mg by mouth daily.  Evolocumab  (REPATHA  SURECLICK) 140 MG/ML SOAJ Inject 140 mg into the skin every 14 (fourteen) days. 6 mL 3   Glucagon  3 MG/DOSE POWD Place 3 mg into the nose once as needed for up to 1 dose. 1 each 11   HUMULIN R  100 UNIT/ML injection INJECT 0.01-0.06 MLS (1-6 UNITS TOTAL) INTO THE SKIN 3 (THREE) TIMES DAILY BEFORE MEALS. AS  INSTRUCTED PER SLIDING SCALE 30 mL 1   insulin  NPH Human (HUMULIN N) 100 UNIT/ML injection Inject 0.08 mLs (8 Units total) into the skin at bedtime. 10 mL 5   isosorbide  mononitrate (IMDUR ) 30 MG 24 hr tablet Take 1 tablet (30 mg total) by mouth daily. 90 tablet 3   metoprolol  succinate (TOPROL -XL) 25 MG 24 hr tablet TAKE 1 TABLET (25 MG TOTAL) BY MOUTH DAILY. 90 tablet 3   Multiple Vitamin (MULTIVITAMIN) tablet Take 1 tablet by mouth daily.     nitroGLYCERIN  (NITROSTAT ) 0.4 MG SL tablet Place 1 tablet (0.4 mg total) under the tongue every 5 (five) minutes as needed for chest pain. 25 tablet 3   Omega-3 Fatty Acids (FISH OIL) 500 MG CAPS Take 500 mg by mouth daily.     TECHLITE INSULIN  SYRINGE 31G X 5/16 0.3 ML MISC USE UP TO 5 TIMES DAILY TO INJECT INSULIN  DAILY 300 each 1   tolterodine  (DETROL ) 2 MG tablet Take 1 tablet (2 mg total) by mouth daily. 90 tablet 1   No current facility-administered medications on file prior to visit.   Allergies  Allergen Reactions   Protonix  [Pantoprazole ] Swelling    Facial Swelling   Toujeo  Solostar [Insulin  Glargine] Shortness Of Breath   Lactose Intolerance (Gi)     GI   Magnesium -Containing Compounds Swelling and Other (See Comments)    limbs   Adhesive [Tape] Rash and Other (See Comments)    Band aids   Family History  Problem Relation Age of Onset   Diabetes Maternal Grandmother    Heart failure Maternal Grandmother    COPD Maternal Grandmother        smoker   Thyroid  disease Maternal Grandmother    Vascular Disease Maternal Grandmother    Colon cancer Maternal Grandfather    Colon polyps Mother    Breast cancer Other    CAD Brother 76       stent   Esophageal cancer Neg Hx    Rectal cancer Neg Hx    Stomach cancer Neg Hx    PE: BP 120/70   Pulse (!) 56   Ht 5' 1 (1.549 m)   Wt 137 lb 12.8 oz (62.5 kg)   SpO2 99%   BMI 26.04 kg/m    Wt Readings from Last 10 Encounters:  04/15/24 137 lb 12.8 oz (62.5 kg)  03/15/24 135 lb  (61.2 kg)  03/10/24 136 lb (61.7 kg)  01/14/24 135 lb 6.4 oz (61.4 kg)  11/24/23 133 lb (60.3 kg)  10/17/23 135 lb 6 oz (61.4 kg)  10/16/23 135 lb 6.4 oz (61.4 kg)  10/13/23 134 lb 6.4 oz (61 kg)  09/24/23 131 lb 6 oz (59.6 kg)  09/12/23 132 lb (59.9 kg)   Constitutional: normal weight, in NAD Eyes: no exophthalmos ENT: no masses palpated in neck, no cervical lymphadenopathy Cardiovascular: RRR, No MRG Respiratory: CTA B Musculoskeletal: no deformities Skin: no rashes Neurological: no tremor with outstretched hands  ASSESSMENT: 1. DM1, uncontrolled, with complications - CAD - s/p 5v CABG - 02/21/2020 - Mild DR - Gastroparesis - This is the reason  why she is on regular insulin , and she is taking it 10 minutes  rather than 30 minutes before eating  She had low CBGs with Novolog !  2. HL  PLAN:  1.  Patient with difficult to control type 1 diabetes, on basal-bolus insulin  regimen with intermediate acting insulin  and short acting insulin .  She was previously on an insulin  pump but she felt that her sugars were higher on the OmniPod 5 device and stopped in 02/2022.  She had another insulin  pump more than 10 years prior to this, but she did not feel that the sugars improved on this system, either.  She prefers to manage them without the pump for now.  At last visit, HbA1c was slightly higher, at 6.6%, but still at goal.  At that time, sugars were increasing after exercise in the morning, between 9 AM and 12 PM despite the fact that she was taking 2 units of regular insulin  before exercise.  We discussed about the possibility of taking more insulin  at the beginning of exercise if no lows.  Sugars were still high overnight and, upon questioning, she had backed off her NPH dose as she mentioned that she did not see a difference between using a lower dose in the higher dose.  However, reviewing the CGM trends, sugars were better when she was using the higher dose so I advised her to increase this  again.  We also discussed about possibly needing to strengthen her insulin  to carb ratio with dinner during the holidays.  We also discussed about possibly using the upper thighs and even buttocks for insulin  injections as she probably had scar tissue at the other sites due to many years of insulin  injections.  She was wondering about what she can do about the fact that she had a 50% obstructed coronary artery (no stent) and I recommended a plant-based diet and gave her a reference book for this. - We previously discussed about possible reasons for sugar fluctuations during the day including variable absorption rates, dehydration, stress.  We discussed that she is not a candidate for the V-Go or CeQur pump due to the fact that she requires small amounts of insulin .   -CGM interpretation: -At today's visit, we reviewed her CGM downloads: It appears that 80% of values are in target range (goal >70%), while 18% are higher than 180 (goal <25%), and 2% are lower than 70 (goal <4%).  The calculated average blood sugar is 138.  The projected HbA1c for the next 3 months (GMI) is 6.6%. -Reviewing the CGM trends, sugars appear to be improved from before, with less lows and hyperglycemic spikes throughout.  However, she complains that she started seeing higher blood sugars usually after breakfast, despite eating a lower carb meal.  She boluses 3 units for approximately 15 g of carbs in the morning.  And then correct at least twice with sliding scale.  We discussed about reducing the amount of sliding scale she is using later in the morning to half.  She has regular insulin  injections which allow her to inject half units.  However, I suspect that the sugars may be increasing after breakfast because her intermediate acting insulin  is getting out of her system.  I advised her to use a low-dose of NPH in the morning, also.  She can mix this with the regular insulin  before breakfast.  She will try this.  She also feels that  some of her problems are related to slow absorption from the injection sites.  She started to rotate the sites more since last visit.  She will continue with this practice. -I advised her to Patient Instructions  Please continue: - NPH insulin  10 units in the evening (try to add 3-4 units in am, too) - R insulin : B'fast: 5:1 (if going for a walk: 10:1) Lunch: 15:1 Dinner: 25:1  - target 150 - insulin  sensitivity factor (ISF) 60: 150-200: + 1 unit 201-260: + 2 units 261-320: + 3 units 321-380: + 4 units >380: + 5 units Use half of these amounts if you have to keep correcting. Do not correct bedtime sugars <300, and only inject 1 unit then.  For the snack at night, do not take more than 1-2 units of regular insulin !   Please return in 4-6 months.  - we checked her HbA1c: 6.2% (lower) - advised to check sugars at different times of the day - 4x a day, rotating check times - advised for yearly eye exams >> she is UTD - return to clinic in 4-6 months     2. HL - Latest lipid panel from 09/2023: LDL only slightly above our target of less than 55, otherwise fractions at goal: Lab Results  Component Value Date   CHOL 133 09/24/2023   HDL 47.10 09/24/2023   LDLCALC 61 09/24/2023   LDLDIRECT 120.0 06/17/2019   TRIG 125.0 09/24/2023   CHOLHDL 3 09/24/2023  - She continues on Repatha  and fish oil without side effects  Emilie Harden, MD PhD [facility] Endocrinology

## 2024-04-15 NOTE — Telephone Encounter (Signed)
 Dr. Mariam Shingles, patient will be scheduled as soon as possible.  Auth Submission: NO AUTH NEEDED Site of care: Site of care: [facility] Payer: BCBS commercial Medication & CPT/J Code(s) submitted: Ibandronate injection J1740 Route of submission (phone, fax, portal): phone Phone # Fax # Auth type: Buy/Bill PB Units/visits requested: 3mg  x 4 doses Reference number: [id number] Approval from: 04/15/24 to 11/03/24

## 2024-04-29 NOTE — Telephone Encounter (Signed)
 Late entry - discussed at last OV.

## 2024-05-04 ENCOUNTER — Other Ambulatory Visit: Payer: Self-pay | Admitting: Internal Medicine

## 2024-05-06 DIAGNOSIS — H4312 Vitreous hemorrhage, left eye: Secondary | ICD-10-CM | POA: Diagnosis not present

## 2024-05-06 DIAGNOSIS — H2513 Age-related nuclear cataract, bilateral: Secondary | ICD-10-CM | POA: Diagnosis not present

## 2024-06-03 ENCOUNTER — Other Ambulatory Visit: Payer: Self-pay | Admitting: Cardiovascular Disease

## 2024-06-03 DIAGNOSIS — I214 Non-ST elevation (NSTEMI) myocardial infarction: Secondary | ICD-10-CM

## 2024-06-03 DIAGNOSIS — I25708 Atherosclerosis of coronary artery bypass graft(s), unspecified, with other forms of angina pectoris: Secondary | ICD-10-CM

## 2024-06-03 DIAGNOSIS — E1069 Type 1 diabetes mellitus with other specified complication: Secondary | ICD-10-CM

## 2024-06-04 DIAGNOSIS — G5601 Carpal tunnel syndrome, right upper limb: Secondary | ICD-10-CM | POA: Diagnosis not present

## 2024-06-07 ENCOUNTER — Encounter: Payer: Self-pay | Admitting: Family Medicine

## 2024-06-07 NOTE — Telephone Encounter (Signed)
 See mychart message.  Would offer virtual visit for 4:30pm today if ongoing symptoms.

## 2024-06-07 NOTE — Telephone Encounter (Signed)
 Called patient states that she started fosamax  on Sunday (yesterday) she followed all directions about how to take. About 45 minutes after she started having some nasua and lower abdominal cramping. She denies any vomiting at all. She denies any changes in symptoms from onset. She will pick up Pepcid  as recommended. She is taking the 40 mg of Nexium  and has been on that for several years. She is not avalable for the appointment this afternoon. States she is driving during that time. Would like to see if improves in the next day or so. She wanted to know if there was supplement she can add to vit D and calcium  that will help with bones?

## 2024-06-10 ENCOUNTER — Telehealth: Payer: Self-pay

## 2024-06-10 NOTE — Telephone Encounter (Signed)
 Copied from CRM [id number]. Topic: Clinical - Request for Lab/Test Order >> Jun 10, 2024  3:01 PM Rea C wrote: Reason for CRM: Patient is scheduled for physical and would like  to have labs done prior.

## 2024-06-11 NOTE — Telephone Encounter (Signed)
 Please schedule labs 1 wk prior to physical

## 2024-06-11 NOTE — Telephone Encounter (Signed)
 Lvm to schedule for lab work

## 2024-06-17 DIAGNOSIS — G5601 Carpal tunnel syndrome, right upper limb: Secondary | ICD-10-CM | POA: Diagnosis not present

## 2024-06-17 DIAGNOSIS — Z4789 Encounter for other orthopedic aftercare: Secondary | ICD-10-CM | POA: Diagnosis not present

## 2024-06-29 DIAGNOSIS — G5601 Carpal tunnel syndrome, right upper limb: Secondary | ICD-10-CM | POA: Diagnosis not present

## 2024-07-25 ENCOUNTER — Other Ambulatory Visit: Payer: Self-pay | Admitting: Internal Medicine

## 2024-07-27 ENCOUNTER — Encounter: Payer: Self-pay | Admitting: Internal Medicine

## 2024-07-27 MED ORDER — "BD SAFETYGLIDE INSULIN SYRINGE 31G X 5/16"" 0.3 ML MISC": 3 refills | Status: DC

## 2024-07-30 ENCOUNTER — Encounter (INDEPENDENT_AMBULATORY_CARE_PROVIDER_SITE_OTHER): Payer: Self-pay | Admitting: Family Medicine

## 2024-07-30 DIAGNOSIS — N3941 Urge incontinence: Secondary | ICD-10-CM

## 2024-07-31 DIAGNOSIS — N3941 Urge incontinence: Secondary | ICD-10-CM

## 2024-07-31 MED ORDER — MIRABEGRON ER 25 MG PO TB24: 25.0000 mg | ORAL_TABLET | Freq: Every day | ORAL | 6 refills | Status: DC

## 2024-07-31 NOTE — Telephone Encounter (Signed)
 Please see the MyChart message reply(ies) for my assessment and plan.  The patient gave consent for this Medical Advice Message and is aware that it may result in a bill to their insurance company as well as the possibility that this may result in a co-payment or deductible. They are an established patient, but are not seeking medical advice exclusively about a problem treated during an in person or video visit in the last 7 days. I did not recommend an in person or video visit within 7 days of my reply.  I spent a total of 5 minutes cumulative time within 7 days through MyChart messaging Anton Blas, MD   BP Readings from Last 3 Encounters:  04/15/24 120/70  04/12/24 136/80  03/15/24 (!) 180/80   Lab Results  Component Value Date   NA 138 09/24/2023   CL 100 09/24/2023   K 4.5 09/24/2023   CO2 32 09/24/2023   BUN 25 (H) 09/24/2023   CREATININE 1.19 09/24/2023   GFR 49.67 (L) 09/24/2023   CALCIUM  9.8 09/24/2023   PHOS 3.8 10/05/2021   ALBUMIN  4.1 09/24/2023   GLUCOSE 139 (H) 09/24/2023

## 2024-08-26 ENCOUNTER — Other Ambulatory Visit: Payer: Self-pay | Admitting: Internal Medicine

## 2024-08-26 NOTE — Telephone Encounter (Signed)
 Refill request complete

## 2024-08-27 ENCOUNTER — Other Ambulatory Visit: Payer: Self-pay | Admitting: Cardiovascular Disease

## 2024-08-27 ENCOUNTER — Other Ambulatory Visit: Payer: Self-pay | Admitting: Family Medicine

## 2024-08-27 DIAGNOSIS — E1022 Type 1 diabetes mellitus with diabetic chronic kidney disease: Secondary | ICD-10-CM

## 2024-08-27 DIAGNOSIS — M810 Age-related osteoporosis without current pathological fracture: Secondary | ICD-10-CM

## 2024-08-27 DIAGNOSIS — E1069 Type 1 diabetes mellitus with other specified complication: Secondary | ICD-10-CM

## 2024-08-27 DIAGNOSIS — E1059 Type 1 diabetes mellitus with other circulatory complications: Secondary | ICD-10-CM

## 2024-08-27 NOTE — Progress Notes (Signed)
Labs for physical ordered

## 2024-08-31 ENCOUNTER — Other Ambulatory Visit (INDEPENDENT_AMBULATORY_CARE_PROVIDER_SITE_OTHER)

## 2024-08-31 DIAGNOSIS — E785 Hyperlipidemia, unspecified: Secondary | ICD-10-CM

## 2024-08-31 DIAGNOSIS — E1022 Type 1 diabetes mellitus with diabetic chronic kidney disease: Secondary | ICD-10-CM

## 2024-08-31 DIAGNOSIS — N183 Chronic kidney disease, stage 3 unspecified: Secondary | ICD-10-CM

## 2024-08-31 DIAGNOSIS — M810 Age-related osteoporosis without current pathological fracture: Secondary | ICD-10-CM | POA: Diagnosis not present

## 2024-08-31 DIAGNOSIS — E1069 Type 1 diabetes mellitus with other specified complication: Secondary | ICD-10-CM | POA: Diagnosis not present

## 2024-08-31 DIAGNOSIS — E1059 Type 1 diabetes mellitus with other circulatory complications: Secondary | ICD-10-CM | POA: Diagnosis not present

## 2024-08-31 LAB — COMPREHENSIVE METABOLIC PANEL WITH GFR
ALT: 15 U/L (ref 0–35)
AST: 22 U/L (ref 0–37)
Albumin: 4 g/dL (ref 3.5–5.2)
Alkaline Phosphatase: 55 U/L (ref 39–117)
BUN: 13 mg/dL (ref 6–23)
CO2: 29 meq/L (ref 19–32)
Calcium: 8.9 mg/dL (ref 8.4–10.5)
Chloride: 102 meq/L (ref 96–112)
Creatinine, Ser: 1.06 mg/dL (ref 0.40–1.20)
GFR: 56.69 mL/min — ABNORMAL LOW (ref >60.00)
Glucose, Bld: 160 mg/dL — ABNORMAL HIGH (ref 70–99)
Potassium: 4.5 meq/L (ref 3.5–5.1)
Sodium: 138 meq/L (ref 135–145)
Total Bilirubin: 0.4 mg/dL (ref 0.2–1.2)
Total Protein: 6.4 g/dL (ref 6.0–8.3)

## 2024-08-31 LAB — LIPID PANEL
Cholesterol: 155 mg/dL (ref 0–200)
HDL: 45.8 mg/dL (ref >39.00)
LDL Cholesterol: 77 mg/dL (ref 0–99)
NonHDL: 109.19
Total CHOL/HDL Ratio: 3
Triglycerides: 163 mg/dL — ABNORMAL HIGH (ref 0.0–149.0)
VLDL: 32.6 mg/dL (ref 0.0–40.0)

## 2024-08-31 LAB — CBC WITH DIFFERENTIAL/PLATELET
Basophils Absolute: 0 K/uL (ref 0.0–0.1)
Basophils Relative: 1.1 % (ref 0.0–3.0)
Eosinophils Absolute: 0.2 K/uL (ref 0.0–0.7)
Eosinophils Relative: 4.6 % (ref 0.0–5.0)
HCT: 37.4 % (ref 36.0–46.0)
Hemoglobin: 12.5 g/dL (ref 12.0–15.0)
Lymphocytes Relative: 35.4 % (ref 12.0–46.0)
Lymphs Abs: 1.5 K/uL (ref 0.7–4.0)
MCHC: 33.3 g/dL (ref 30.0–36.0)
MCV: 90 fl (ref 78.0–100.0)
Monocytes Absolute: 0.4 K/uL (ref 0.1–1.0)
Monocytes Relative: 8.3 % (ref 3.0–12.0)
Neutro Abs: 2.2 K/uL (ref 1.4–7.7)
Neutrophils Relative %: 50.6 % (ref 43.0–77.0)
Platelets: 232 K/uL (ref 150.0–400.0)
RBC: 4.15 Mil/uL (ref 3.87–5.11)
RDW: 14.2 % (ref 11.5–15.5)
WBC: 4.4 K/uL (ref 4.0–10.5)

## 2024-08-31 LAB — MICROALBUMIN / CREATININE URINE RATIO
Creatinine,U: 63.3 mg/dL
Microalb Creat Ratio: 36.4 mg/g — ABNORMAL HIGH (ref 0.0–30.0)
Microalb, Ur: 2.3 mg/dL — ABNORMAL HIGH (ref 0.0–1.9)

## 2024-08-31 LAB — VITAMIN B12: Vitamin B-12: 845 pg/mL (ref 211–911)

## 2024-08-31 LAB — PHOSPHORUS: Phosphorus: 3.7 mg/dL (ref 2.3–4.6)

## 2024-08-31 LAB — VITAMIN D 25 HYDROXY (VIT D DEFICIENCY, FRACTURES): VITD: 61.24 ng/mL (ref 30.00–100.00)

## 2024-08-31 LAB — TSH: TSH: 2.27 u[IU]/mL (ref 0.35–5.50)

## 2024-08-31 LAB — HEMOGLOBIN A1C: Hgb A1c MFr Bld: 6.9 % — ABNORMAL HIGH (ref 4.6–6.5)

## 2024-09-01 LAB — PARATHYROID HORMONE, INTACT (NO CA): PTH: 43 pg/mL (ref 16–77)

## 2024-09-04 ENCOUNTER — Encounter: Payer: Self-pay | Admitting: Family Medicine

## 2024-09-06 ENCOUNTER — Ambulatory Visit: Attending: Cardiovascular Disease | Admitting: Cardiovascular Disease

## 2024-09-06 ENCOUNTER — Encounter: Payer: Self-pay | Admitting: Cardiovascular Disease

## 2024-09-06 VITALS — BP 132/71 | HR 48 | Ht 64.0 in | Wt 134.6 lb

## 2024-09-06 DIAGNOSIS — I25708 Atherosclerosis of coronary artery bypass graft(s), unspecified, with other forms of angina pectoris: Secondary | ICD-10-CM | POA: Diagnosis not present

## 2024-09-06 DIAGNOSIS — G720 Drug-induced myopathy: Secondary | ICD-10-CM | POA: Diagnosis not present

## 2024-09-06 DIAGNOSIS — R030 Elevated blood-pressure reading, without diagnosis of hypertension: Secondary | ICD-10-CM

## 2024-09-06 DIAGNOSIS — I214 Non-ST elevation (NSTEMI) myocardial infarction: Secondary | ICD-10-CM

## 2024-09-06 DIAGNOSIS — T466X5A Adverse effect of antihyperlipidemic and antiarteriosclerotic drugs, initial encounter: Secondary | ICD-10-CM

## 2024-09-06 DIAGNOSIS — E785 Hyperlipidemia, unspecified: Secondary | ICD-10-CM

## 2024-09-06 DIAGNOSIS — E1069 Type 1 diabetes mellitus with other specified complication: Secondary | ICD-10-CM | POA: Diagnosis not present

## 2024-09-06 MED ORDER — EZETIMIBE 10 MG PO TABS: 10.0000 mg | ORAL_TABLET | Freq: Every day | ORAL | 3 refills | Status: DC

## 2024-09-06 NOTE — Telephone Encounter (Signed)
 Spoke with patient.

## 2024-09-06 NOTE — Patient Instructions (Signed)
 Medication Instructions:  Start Zetia  10 mg daily *If you need a refill on your cardiac medications before your next appointment, please call your pharmacy*  Lab Work: Lipid panel- fasting- in 3 months If you have labs (blood work) drawn today and your tests are completely normal, you will receive your results only by: MyChart Message (if you have MyChart) OR A paper copy in the mail If you have any lab test that is abnormal or we need to change your treatment, we will call you to review the results.  Testing/Procedures: None ordered  Follow-Up: At [facility], you and your health needs are our priority.  As part of our continuing mission to provide you with exceptional heart care, our providers are all part of one team.  This team includes your primary Cardiologist (physician) and Advanced Practice Providers or APPs (Physician Assistants and Nurse Practitioners) who all work together to provide you with the care you need, when you need it.  Your next appointment:   6 month(s)  Provider:   Jerel Balding, MD    We recommend signing up for the patient portal called MyChart.  Sign up information is provided on this After Visit Summary.  MyChart is used to connect with patients for Virtual Visits (Telemedicine).  Patients are able to view lab/test results, encounter notes, upcoming appointments, etc.  Non-urgent messages can be sent to your provider as well.   To learn more about what you can do with MyChart, go to [email or web address].

## 2024-09-06 NOTE — Progress Notes (Signed)
 Cardiology Office Note:    Date:  09/11/2024   ID:  [name], DOB [birth year], MRN [id number]  PCP:  Rilla Baller, MD  [facility] Cardiologist:  Jerel Balding, MD  [facility] Electrophysiologist:  None   Referring MD: Rilla Baller, MD   Chief Complaint  Patient presents with   Coronary Artery Disease    History of Present Illness:    Felicia [name] is a 63 y.o. female with a hx of CAD s/p CABG April 2021, with early vein graft occlusion but patent LIMA to LAD, type 1 diabetes mellitus on insulin , hypercholesterolemia intolerant to statins on PCSK9 inhibitor.  She generally doing well and has not had problems with shortness of breath or chest pain with usual activity.  She can climb a flight of stairs without difficulty (chest to go to several times a day at work).  She has had some challenges with glycemic control due to worsening gastroparesis.  She has not had palpitations, dizziness or syncope.    Her most recent lipid profile was generally favorable with an LDL of 77 (a year ago it was 61), HDL of 46, with a triglycerides of borderline high at 163.  Her cardiac catheterization unfortunately not show any options for percutaneous revascularization. She has complex native coronary artery stenoses with significant obstruction in all 3 major coronary territories.  She has a patent LIMA-LAD but all the vein graft occluded shortly after her bypass surgery.  All the vessels with significant stenoses were too small for PCI-stent.  Her PET scan in October 2024 had showed a large and severe perfusion defect in the mid apical anterolateral segments and the apex, as well as a mild defect in the mid apical inferior wall that was partially reversible.  The overall impression was infarction with peri-infarct ischemia.  The myocardial blood flow reserve is only 1.59.    Early postoperative course was complicated by an episode of syncope with fall and R orbital fracture and left  wrist fracture and coronary/graft angiography showed that she had lost patency of all the SVGs.  The LIMA to LAD bypass was patent.  No intervention was performed.  Echocardiogram showed that left ventricular wall motion was abnormal, significant for moderate hypokinesis of the apical segments of the anterior and anterolateral walls, but overall LVEF was normal at 55 to 60%.  Nuclear stress test 08/03/2020 showed LVEF 70% and a small severe scar in the apex, but no ischemia.  Diagnostic cardiac catheterization 09/12/2023  Severe three vessel CAD s/p 4V CABG with 1/4 patent bypass grafts The LAD has severe mid stenosis. The mid and distal LAD fills from the patent LIMA. The small caliber diagonal branch has severe diffuse disease, not a target for PCI. The vein graft to the diagonal branch is known to be occluded and was not injected.  The Circumflex has moderate non-flow limiting disease. The vein graft to the obtuse marginal branch is known to be occluded and was not injected.  The large dominant RCA has moderate, heavily calcified stenosis in the proximal and mid vessel. Diffuse disease in the small caliber PDA. The vein graft to the posterolateral artery and PDA is known to be occluded and was not injected.  Pressure wire analysis of the RCA with RFR of 0.93 suggesting the lesion in the mid RCA is not flow limiting.  Normal LVEDP   Recommendations: Continue medical management of CAD.    Dominance: Right         Past Medical  History:  Diagnosis Date   Allergy    Anemia    past history long ago   Borderline HTN (hypertension)    CAD (coronary artery disease)    a. 02/2020 Cath: LM nl, LAD large, 90p, 65m, D1 90p, LCX nl, OM 60-70p, RCA dominant w/ dampening upon engagement of ostium. 70p/m, RPDA 50. EF 55-65%.   COVID-19 virus infection 02/2021   Diabetes (HCC)    Disorder of kidney    has abd kidney (right)   GERD (gastroesophageal reflux disease)    History of diabetic  gastroparesis    History of echocardiogram    a. 01/2013 Echo: EF 55-65%, no rwma. Mild MR. Nl RV fxn.   HLD (hyperlipidemia)    Hx: UTI (urinary tract infection)    IBS (irritable bowel syndrome)    Nephrolithiasis 2005   Orbital fracture (HCC) 02/26/2020   Osteopenia 2012   per pt   Radius and ulna distal fracture 02/26/2020   Reflux esophagitis    Systolic murmur 01/2013   mild mitral insuff   Type 1 diabetes mellitus with diabetic retinopathy without macular edema (HCC)    dx at 63 y/o, background retinopathy    Past Surgical History:  Procedure Laterality Date   APPENDECTOMY     CESAREAN SECTION  1985   COLONOSCOPY  07/2016   WNL (Pyrtle)   CORONARY ARTERY BYPASS GRAFT N/A 02/21/2020   Procedure: CORONARY ARTERY BYPASS GRAFTING (CABG) times five using left internal mammary and right leg saphenous vein;  Surgeon: Shyrl Linnie KIDD, MD;  Location: [facility] OR;  Service: Open Heart Surgery;  Laterality: N/A;   CORONARY PRESSURE/FFR STUDY N/A 09/12/2023   Procedure: CORONARY PRESSURE/FFR STUDY;  Surgeon: Verlin Lonni BIRCH, MD;  Location: [facility] INVASIVE CV LAB;  Service: Cardiovascular;  Laterality: N/A;   ESOPHAGOGASTRODUODENOSCOPY  07/2016   LA Grade C reflux esophagitis, concern for stasis/dysmotility (Pyrtle)   LAPAROSCOPIC APPENDECTOMY N/A 09/01/2015   Procedure: APPENDECTOMY LAPAROSCOPIC;  Surgeon: Lynwood Pina, MD;  Location[facility] OR;  Service: General;  Laterality: N/A;   LEFT HEART CATH AND CORONARY ANGIOGRAPHY N/A 02/17/2020   Procedure: LEFT HEART CATH AND CORONARY ANGIOGRAPHY;  Surgeon: Perla Evalene PARAS, MD;  Location: [facility] INVASIVE CV LAB;  Service: Cardiovascular;  Laterality: N/A;   LEFT HEART CATH AND CORS/GRAFTS ANGIOGRAPHY N/A 02/29/2020   early failure of 4/5 bypass grafts - medically managed Adine, Lonni BIRCH, MD)   LEFT HEART CATH AND CORS/GRAFTS ANGIOGRAPHY N/A 09/12/2023   Procedure: LEFT HEART CATH AND CORS/GRAFTS ANGIOGRAPHY;  Surgeon: Verlin Lonni BIRCH, MD;  Location: [facility] INVASIVE CV LAB;  Service: Cardiovascular;  Laterality: N/A;   ORIF FEMUR FRACTURE Left 1996   Corrected by patient in history 2016   PARTIAL HYSTERECTOMY  2000   paps by [facility]   TEE WITHOUT CARDIOVERSION N/A 02/21/2020   Procedure: TRANSESOPHAGEAL ECHOCARDIOGRAM (TEE);  Surgeon: Shyrl Linnie KIDD, MD;  Location: [facility] OR;  Service: Open Heart Surgery;  Laterality: N/A;   TONSILLECTOMY AND ADENOIDECTOMY  1975   TRIGGER FINGER RELEASE Left 12/2010   thumb    Current Medications: Current Meds  Medication Sig   aspirin  81 MG EC tablet Take 1 tablet (81 mg total) by mouth daily.   B-D INSULIN  SYRINGE 31G X 5/16 0.3 ML MISC INJECT INSULIN  UP TO 5 TIMES DAILY   calcium  carbonate (CALCIUM  600) 600 MG TABS tablet Take 1 tablet (600 mg total) by mouth daily with breakfast.   Continuous Glucose Sensor (DEXCOM G7 SENSOR) MISC  CHANGE SENSOR EVERY 10 DAYS   esomeprazole  (NEXIUM ) 20 MG capsule Take 40 mg by mouth daily.   Evolocumab  (REPATHA  SURECLICK) 140 MG/ML SOAJ INJECT 140 MG INTO THE SKIN EVERY 14 (FOURTEEN) DAYS.   ezetimibe  (ZETIA ) 10 MG tablet Take 1 tablet (10 mg total) by mouth daily.   Glucagon  3 MG/DOSE POWD Place 3 mg into the nose once as needed for up to 1 dose.   HUMULIN R  100 UNIT/ML injection INJECT 0.01-0.06 MLS (1-6 UNITS TOTAL) INTO THE SKIN 3 (THREE) TIMES DAILY BEFORE MEALS. AS INSTRUCTED PER SLIDING SCALE   insulin  NPH Human (HUMULIN N) 100 UNIT/ML injection INJECT UP TO 14 UNITS A DAY UNDER SKIN AS ADVISED   isosorbide  mononitrate (IMDUR ) 30 MG 24 hr tablet TAKE 1 TABLET BY MOUTH EVERY DAY   metoprolol  succinate (TOPROL -XL) 25 MG 24 hr tablet TAKE 1 TABLET (25 MG TOTAL) BY MOUTH DAILY.   Multiple Vitamin (MULTIVITAMIN) tablet Take 1 tablet by mouth daily.   Omega-3 Fatty Acids (FISH OIL) 500 MG CAPS Take 500 mg by mouth daily.     Allergies:   Protonix  [pantoprazole ], Toujeo  solostar [insulin  glargine], Fosamax   [alendronate ], Lactose intolerance (gi), Magnesium -containing compounds, and Adhesive [tape]      Family History: The patient's family history includes Breast cancer in an other family member; CAD (age of onset: 43) in her brother; COPD in her maternal grandmother; Colon cancer in her maternal grandfather; Colon polyps in her mother; Diabetes in her maternal grandmother; Heart failure in her maternal grandmother; Thyroid  disease in her maternal grandmother; Vascular Disease in her maternal grandmother. There is no history of Esophageal cancer, Rectal cancer, or Stomach cancer.  EKGs/Labs/Other Studies Reviewed:    The following studies were reviewed today:   Cardiac PET scan 08/07/2023     Findings are consistent with infarction with peri-infarct ischemia. The study is intermediate risk. Sum difference score 6. Peri-infarct ischemia in the anterior and anterolateral walls primarily. Intermediate risk study due to ischemia, suboptimal augmentation of EF with stress, and reduced stress CFR.   LV perfusion is abnormal. There is evidence of ischemia. There is evidence of infarction. Defect 1: There is a large defect with severe reduction in uptake present in the apical to mid anterior, lateral and apex location(s) that is partially reversible. Mild perfusion defect in in the apical to mid inferior wall that is partially reversible. There is abnormal wall motion in the defect area. Consistent with infarction and peri-infarct ischemia.   Rest left ventricular function is normal. Rest EF: 67%. Stress left ventricular function is normal. Stress EF: 69%. End diastolic cavity size is normal. End systolic cavity size is normal. No evidence of transient ischemic dilation (TID) noted.   Myocardial blood flow was computed to be 1.54ml/g/min at rest and 1.91ml/g/min at stress. Global myocardial blood flow reserve was 1.59 and was abnormal. High rest flows due artificially decrease MBFR, however stress flows are  <2, however this is challenging to interpret in the setting of prior CABG.   Coronary calcium  assessment not performed due to prior revascularization.   Echo 06/12/2020  1. Left ventricular ejection fraction, by estimation, is 55 to 60%. The  left ventricle has normal function. The left ventricle has no regional  wall motion abnormalities. Left ventricular diastolic parameters are  consistent with Grade II diastolic  dysfunction (pseudonormalization).   2. Right ventricular systolic function is normal. The right ventricular  size is normal.   3. The mitral valve is normal in structure. Mild  mitral valve  regurgitation. No evidence of mitral stenosis.   4. The aortic valve is tricuspid. Aortic valve regurgitation is not  visualized. Mild aortic valve sclerosis is present, with no evidence of  aortic valve stenosis.   5. The inferior vena cava is normal in size with greater than 50%  respiratory variability, suggesting right atrial pressure of 3 mmHg.   EKG:   EKG Interpretation Date/Time:  Monday September 06 2024 08:00:02 EST Ventricular Rate:  48 PR Interval:  138 QRS Duration:  70 QT Interval:  452 QTC Calculation: 403 R Axis:   90  Text Interpretation: Sinus bradycardia Low voltage QRS Septal infarct , age undetermined Possible Lateral infarct (cited on or before 14-Jan-2024) When compared with ECG of 14-Jan-2024 08:07, No significant change was found Confirmed by Namon Villarin [phone]) on 09/06/2024 8:07:33 AM         Recent Labs: 08/31/2024: ALT 15; BUN 13; Creatinine, Ser 1.06; Hemoglobin 12.5; Platelets 232.0; Potassium 4.5; Sodium 138; TSH 2.27  Recent Lipid Panel    Component Value Date/Time   CHOL 155 08/31/2024 0831   TRIG 163.0 (H) 08/31/2024 0831   HDL 45.80 08/31/2024 0831   CHOLHDL 3 08/31/2024 0831   VLDL 32.6 08/31/2024 0831   LDLCALC 77 08/31/2024 0831   LDLDIRECT 120.0 06/17/2019 0738    Physical Exam:    VS:  BP 132/71 (BP Location: Left Arm)    Pulse (!) 48   Ht 5' 4 (1.626 m)   Wt 134 lb 9.6 oz (61.1 kg)   SpO2 100%   BMI 23.10 kg/m     Wt Readings from Last 3 Encounters:  09/06/24 134 lb 9.6 oz (61.1 kg)  04/15/24 137 lb 12.8 oz (62.5 kg)  03/15/24 135 lb (61.2 kg)     General: Alert, oriented x3, no distress, lean, petite frame Head: no evidence of trauma, PERRL, EOMI, no exophtalmos or lid lag, no myxedema, no xanthelasma; normal ears, nose and oropharynx Neck: normal jugular venous pulsations and no hepatojugular reflux; brisk carotid pulses without delay and no carotid bruits Chest: clear to auscultation, no signs of consolidation by percussion or palpation, normal fremitus, symmetrical and full respiratory excursions Cardiovascular: normal position and quality of the apical impulse, regular rhythm, normal first and second heart sounds, no murmurs, rubs or gallops Abdomen: no tenderness or distention, no masses by palpation, no abnormal pulsatility or arterial bruits, normal bowel sounds, no hepatosplenomegaly Extremities: no clubbing, cyanosis or edema; 2+ radial, ulnar and brachial pulses bilaterally; 2+ right femoral, posterior tibial and dorsalis pedis pulses; 2+ left femoral, posterior tibial and dorsalis pedis pulses; no subclavian or femoral bruits Neurological: grossly nonfocal Psych: Normal mood and affect    ASSESSMENT:    1. Coronary artery disease involving coronary bypass graft of native heart with other forms of angina pectoris   2. Hyperlipidemia due to type 1 diabetes mellitus (HCC)   3. Elevated blood pressure reading   4. Statin myopathy         PLAN:    In order of problems listed above:  CAD s/p CABG: Symptoms are currently reasonably well-controlled.  Continue medical therapy with metoprolol  succinate 25 mg once daily, isosorbide  mononitrate 30 mg daily.  She tried Ranexa  but felt poorly even on the low-dose, developing fatigue, dizziness and a sensation of her throat closing.   Unfortunately there is no good option for either percutaneous or surgical revascularization.  The LIMA is widely patent and supplies good blood flow to the apex,  but there is ischemia in the mid anterior wall in the territory of the severely diseased small diagonal artery.  Similarly, her right coronary artery is widely patent with a nonsignificant stenosis, but the distal small PDA has a tight stenosis.  The oblique marginal branches are not amenable to revascularization.  The best option remains medical therapy.   HLP: Her LDL cholesterol is a little higher although she has been compliant with Repatha .  Will add ezetimibe .  Previous side effects when she was taking Zetia  occurred when she was also taking statins.  I am sure this will get us  to target LDL less than 70.  History of statin myopathy with multiple agents, even with pravastatin . DM: Continues to have very good glycemic control, albeit with a lot of constant careful attention on her part and that of her endocrinologist.  Type 1 diabetes mellitus for 45 years. She is very insulin  sensitive.  Followed by Dr. Trixie in the endocrinology clinic. Elevated BP: Unusual for her.  She checks her blood pressure frequently at home and a typical reading is 108-135/55-78.  In fact, low blood pressure has prevented us  from escalating antianginals.  No change to BP/antianginal medications made today.    Medication Adjustments/Labs and Tests Ordered: Current medicines are reviewed at length with the patient today.  Concerns regarding medicines are outlined above.  Orders Placed This Encounter  Procedures   Lipid panel   EKG 12-Lead   Meds ordered this encounter  Medications   ezetimibe  (ZETIA ) 10 MG tablet    Sig: Take 1 tablet (10 mg total) by mouth daily.    Dispense:  90 tablet    Refill:  3    Patient Instructions  Medication Instructions:  Start Zetia  10 mg daily *If you need a refill on your cardiac medications before your next  appointment, please call your pharmacy*  Lab Work: Lipid panel- fasting- in 3 months If you have labs (blood work) drawn today and your tests are completely normal, you will receive your results only by: MyChart Message (if you have MyChart) OR A paper copy in the mail If you have any lab test that is abnormal or we need to change your treatment, we will call you to review the results.  Testing/Procedures: None ordered  Follow-Up: At [facility], you and your health needs are our priority.  As part of our continuing mission to provide you with exceptional heart care, our providers are all part of one team.  This team includes your primary Cardiologist (physician) and Advanced Practice Providers or APPs (Physician Assistants and Nurse Practitioners) who all work together to provide you with the care you need, when you need it.  Your next appointment:   6 month(s)  Provider:   Jerel Balding, MD    We recommend signing up for the patient portal called MyChart.  Sign up information is provided on this After Visit Summary.  MyChart is used to connect with patients for Virtual Visits (Telemedicine).  Patients are able to view lab/test results, encounter notes, upcoming appointments, etc.  Non-urgent messages can be sent to your provider as well.   To learn more about what you can do with MyChart, go to [email or web address].      Signed, Jerel Balding, MD  09/11/2024 4:13 PM    [facility]

## 2024-09-07 ENCOUNTER — Ambulatory Visit: Payer: Self-pay | Admitting: Family Medicine

## 2024-09-11 ENCOUNTER — Encounter: Payer: Self-pay | Admitting: Cardiovascular Disease

## 2024-09-14 ENCOUNTER — Encounter: Payer: Self-pay | Admitting: Cardiovascular Disease

## 2024-09-20 ENCOUNTER — Telehealth: Admitting: Internal Medicine

## 2024-09-20 ENCOUNTER — Encounter: Payer: Self-pay | Admitting: Internal Medicine

## 2024-09-20 ENCOUNTER — Other Ambulatory Visit

## 2024-09-20 VITALS — Ht 64.0 in | Wt 132.0 lb

## 2024-09-20 DIAGNOSIS — E1069 Type 1 diabetes mellitus with other specified complication: Secondary | ICD-10-CM

## 2024-09-20 DIAGNOSIS — E785 Hyperlipidemia, unspecified: Secondary | ICD-10-CM | POA: Diagnosis not present

## 2024-09-20 DIAGNOSIS — E103293 Type 1 diabetes mellitus with mild nonproliferative diabetic retinopathy without macular edema, bilateral: Secondary | ICD-10-CM

## 2024-09-20 NOTE — Progress Notes (Signed)
 Patient ID: Felicia [name], female   DOB: 1961/10/01, 63 y.o.   MRN: [id number]  Patient location: Home My location: Office Persons participating in the virtual visit: patient, provider  Referring Provider: Rilla Baller, MD  I connected with the patient on 09/20/24 at  9:11 AM EST by a video enabled telemedicine application and verified that I am speaking with the correct person.   I discussed the limitations of evaluation and management by telemedicine and the availability of in person appointments. The patient expressed understanding and agreed to proceed.   Details of the encounter are shown below.  HPI: Felicia [name] is a 63 y.o.-year-old female, presenting for follow-up for DM1, dx in 44 (age 73), uncontrolled, with complications (CAD - s/p 5v CABG, mild retinopathy, gastroparesis). Last visit 5 months ago.  Interim hx: No increased urination, blurry vision, nausea, chest pain.    Insulin  pump: -  >> stopped 02/2022  CGM: - Dexcom G6 >> G7  Reviewed HbA1c levels: Lab Results  Component Value Date   HGBA1C 6.9 (H) 08/31/2024   HGBA1C 6.2 (A) 04/15/2024   HGBA1C 6.6 (A) 10/13/2023   HGBA1C 6.3 05/02/2023   HGBA1C 6.6 (A) 11/01/2022   HGBA1C 7.1 (A) 06/28/2022   HGBA1C 7.3 (A) 03/18/2022   HGBA1C 6.9 (A) 11/30/2021   HGBA1C 7.6 (H) 10/05/2021   HGBA1C 7.9 (H) 06/11/2021   HGBA1C 7.5 (A) 11/30/2020   HGBA1C 7.0 (A) 07/28/2020   HGBA1C 8.9 (H) 02/17/2020   HGBA1C 8.5 (H) 09/23/2019   HGBA1C 9.0 (H) 06/17/2019   HGBA1C 8.0 (A) 09/04/2018   HGBA1C 7.7 (A) 04/17/2018   HGBA1C 7.5 12/16/2017   HGBA1C 7.8 08/14/2017   HGBA1C 7.5 04/08/2017   HGBA1C 7.5 10/29/2016   HGBA1C 7.1 07/30/2016   HGBA1C 7.9 05/06/2016   HGBA1C 7.3 12/28/2015   HGBA1C 7.6 09/27/2015   She is on: - NPH 4  units at night (1-2 am) -added 03/2022 >> 6 >> 7 units at 9-10 pm >> 8 >> 9 (may need 10) >> 8 >> 9 >> 10 units in the evening + add 3 in a.m. - R insulin : B'fast: 5:1 (if going for  a walk: 10:1) Lunch: 15:1 Dinner: 25:1  - target 150 - insulin  sensitivity factor (ISF) 60: 150-200: + 1 unit 201-260: + 2 units 261-320: + 3 units 321-380: + 4 units >380: + 5 units Do not correct bedtime sugars <300, and only inject 1 unit then.  For the snack at night, do not take more than 1-2 units of regular insulin !  Tried Metformin  ER 1000 mg with dinner but stopped due to lack of effect. Tried Tresiba  >> she did not feel that this worked as well as Lantus . Tried Toujeo  >> allergy: CP, SOB. She was previously on insulin  pump for 5 years (~2010) but she did not like it.  Sugars were not better on this.    She was on an OmniPod 5 insulin  pump but felt her sugars were higher >> stopped 02/2022.  Meter: ReliOn  She checks her blood sugars more than 4 times a day with her Dexcom CGM:    Previously:  Prev.:  Lowest sugar was 38 >>.... 55 >> 50s; + hypoglycemia awareness in the 70s.  No recent hyper or hypoglycemia admissions.  She has a glucagon  kit at home. Highest sugar was 500s (bagel)  ... >> 400s >> 400s.   Pt's meals are: - Breakfast: protein bar + fruit >> granola bar (25 g carbs) - 10  am snack  - Lunch: apple + cheese, carrots - Snack bar: 15g carbs - Glucerna - Dinner: meat + 1/2 backed potato + veggies/salad - Snacks: popcorn, salty snacks: Potato chips She is now seeing an RD - renal diabetic diet. Moving from animal to plant based protein. Switched to glucose gummies.  - + mild CKD, last BUN/creatinine:  Lab Results  Component Value Date   BUN 13 08/31/2024   CREATININE 1.06 08/31/2024   Lab Results  Component Value Date   MICRALBCREAT 36.4 (H) 08/31/2024  Previously on quinapril , now off.  + HL; last set of lipids: Lab Results  Component Value Date   CHOL 155 08/31/2024   HDL 45.80 08/31/2024   LDLCALC 77 08/31/2024   LDLDIRECT 120.0 06/17/2019   TRIG 163.0 (H) 08/31/2024   CHOLHDL 3 08/31/2024  She tried pravastatin , lovastatin , Zetia , even  adding co-Q10 but she still had muscle cramps >>  now on Repatha , tolerated well.  Also has 2 creams: Theraworx + Muscle calm.    - last eye exam was on 05/06/2024: + DR reportedly  - no numbness and tingling in her feet. Last foot exam: 10/13/2023.  On ASA 81.  Latest TSH is normal: Lab Results  Component Value Date   TSH 2.27 08/31/2024   She had chest pain in 2024, for which she had to take nitroglycerin .  A heart catheterization showed a coronary artery 50% block, no stent placed. She also has a history of HTN. She has a history of recurrent vaginal candidiasis.  ROS: + See HPI  I reviewed pt's medications, allergies, PMH, social hx, family hx, and changes were documented in the history of present illness. Otherwise, unchanged from my initial visit note.  Past Medical History:  Diagnosis Date   Allergy    Anemia    past history long ago   Borderline HTN (hypertension)    CAD (coronary artery disease)    a. 02/2020 Cath: LM nl, LAD large, 90p, 13m, D1 90p, LCX nl, OM 60-70p, RCA dominant w/ dampening upon engagement of ostium. 70p/m, RPDA 50. EF 55-65%.   COVID-19 virus infection 02/2021   Diabetes (HCC)    Disorder of kidney    has abd kidney (right)   GERD (gastroesophageal reflux disease)    History of diabetic gastroparesis    History of echocardiogram    a. 01/2013 Echo: EF 55-65%, no rwma. Mild MR. Nl RV fxn.   HLD (hyperlipidemia)    Hx: UTI (urinary tract infection)    IBS (irritable bowel syndrome)    Nephrolithiasis 2005   Orbital fracture (HCC) 02/26/2020   Osteopenia 2012   per pt   Radius and ulna distal fracture 02/26/2020   Reflux esophagitis    Systolic murmur 01/2013   mild mitral insuff   Type 1 diabetes mellitus with diabetic retinopathy without macular edema (HCC)    dx at 63 y/o, background retinopathy   Past Surgical History:  Procedure Laterality Date   APPENDECTOMY     CESAREAN SECTION  1985   COLONOSCOPY  07/2016   WNL (Pyrtle)    CORONARY ARTERY BYPASS GRAFT N/A 02/21/2020   Procedure: CORONARY ARTERY BYPASS GRAFTING (CABG) times five using left internal mammary and right leg saphenous vein;  Surgeon: Shyrl Linnie KIDD, MD;  Location: [facility] OR;  Service: Open Heart Surgery;  Laterality: N/A;   CORONARY PRESSURE/FFR STUDY N/A 09/12/2023   Procedure: CORONARY PRESSURE/FFR STUDY;  Surgeon: Verlin Lonni BIRCH, MD;  Location: [facility] INVASIVE [facility];  Service: Cardiovascular;  Laterality: N/A;   ESOPHAGOGASTRODUODENOSCOPY  07/2016   LA Grade C reflux esophagitis, concern for stasis/dysmotility (Pyrtle)   LAPAROSCOPIC APPENDECTOMY N/A 09/01/2015   Procedure: APPENDECTOMY LAPAROSCOPIC;  Surgeon: Lynwood Pina, MD;  Location: [facility] OR;  Service: General;  Laterality: N/A;   LEFT HEART CATH AND CORONARY ANGIOGRAPHY N/A 02/17/2020   Procedure: LEFT HEART CATH AND CORONARY ANGIOGRAPHY;  Surgeon: Perla Evalene PARAS, MD;  Location: [facility] INVASIVE CV LAB;  Service: Cardiovascular;  Laterality: N/A;   LEFT HEART CATH AND CORS/GRAFTS ANGIOGRAPHY N/A 02/29/2020   early failure of 4/5 bypass grafts - medically managed Adine, Lonni BIRCH, MD)   LEFT HEART CATH AND CORS/GRAFTS ANGIOGRAPHY N/A 09/12/2023   Procedure: LEFT HEART CATH AND CORS/GRAFTS ANGIOGRAPHY;  Surgeon: Verlin Lonni BIRCH, MD;  Location: [facility];  Service: Cardiovascular;  Laterality: N/A;   ORIF FEMUR FRACTURE Left 1996   Corrected by patient in history 2016   PARTIAL HYSTERECTOMY  2000   paps by [facility]   TEE WITHOUT CARDIOVERSION N/A 02/21/2020   Procedure: TRANSESOPHAGEAL ECHOCARDIOGRAM (TEE);  Surgeon: Shyrl Linnie KIDD, MD;  Location: [facility] OR;  Service: Open Heart Surgery;  Laterality: N/A;   TONSILLECTOMY AND ADENOIDECTOMY  1975   TRIGGER FINGER RELEASE Left 12/2010   thumb   Social History   Socioeconomic History   Marital status: Married    Spouse name: Not on file   Number of children: 1   Years of education: 16   Highest  education level: Bachelor's degree (e.g., BA, AB, BS)  Occupational History   Occupation: HR Chemical Engineer: OTHER    Comment: Scientist, Research (medical)  Tobacco Use   Smoking status: Never    Passive exposure: Never   Smokeless tobacco: Never  Vaping Use   Vaping status: Never Used  Substance and Sexual Activity   Alcohol use: No    Alcohol/week: 0.0 standard drinks of alcohol   Drug use: No   Sexual activity: Yes    Birth control/protection: None    Comment: Married  Other Topics Concern   Not on file  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  Has one daughter.  Education: college.      1 dog   Social Drivers of Corporate Investment Banker Strain: Not on file  Food Insecurity: Not on file  Transportation Needs: Not on file  Physical Activity: Not on file  Stress: Not on file  Social Connections: Not on file  Intimate Partner Violence: Not on file   Current Outpatient Medications on File Prior to Visit  Medication Sig Dispense Refill   aspirin  81 MG EC tablet Take 1 tablet (81 mg total) by mouth daily.     B-D INSULIN  SYRINGE 31G X 5/16 0.3 ML MISC INJECT INSULIN  UP TO 5 TIMES DAILY 500 each 3   calcium  carbonate (CALCIUM  600) 600 MG TABS tablet Take 1 tablet (600 mg total) by mouth daily with breakfast.     Continuous Glucose Sensor (DEXCOM G7 SENSOR) MISC CHANGE SENSOR EVERY 10 DAYS 9 each 1   esomeprazole  (NEXIUM ) 20 MG capsule Take 40 mg by mouth daily.     Evolocumab  (REPATHA  SURECLICK) 140 MG/ML SOAJ INJECT 140 MG INTO THE SKIN EVERY 14 (FOURTEEN) DAYS. 6 mL 3   ezetimibe  (ZETIA ) 10 MG tablet Take 1 tablet (10 mg total) by mouth daily.  90 tablet 3   Glucagon  3 MG/DOSE POWD Place 3 mg into the nose once as needed for up to 1 dose. 1 each 11   HUMULIN R  100 UNIT/ML injection INJECT 0.01-0.06 MLS (1-6 UNITS TOTAL) INTO THE SKIN 3 (THREE) TIMES DAILY BEFORE MEALS. AS INSTRUCTED PER SLIDING  SCALE 90 mL 1   insulin  NPH Human (HUMULIN N) 100 UNIT/ML injection INJECT UP TO 14 UNITS A DAY UNDER SKIN AS ADVISED 20 mL 11   isosorbide  mononitrate (IMDUR ) 30 MG 24 hr tablet TAKE 1 TABLET BY MOUTH EVERY DAY 90 tablet 1   metoprolol  succinate (TOPROL -XL) 25 MG 24 hr tablet TAKE 1 TABLET (25 MG TOTAL) BY MOUTH DAILY. 90 tablet 3   Multiple Vitamin (MULTIVITAMIN) tablet Take 1 tablet by mouth daily.     nitroGLYCERIN  (NITROSTAT ) 0.4 MG SL tablet Place 1 tablet (0.4 mg total) under the tongue every 5 (five) minutes as needed for chest pain. (Patient not taking: Reported on 09/06/2024) 25 tablet 3   Omega-3 Fatty Acids (FISH OIL) 500 MG CAPS Take 500 mg by mouth daily.     No current facility-administered medications on file prior to visit.   Allergies  Allergen Reactions   Protonix  [Pantoprazole ] Swelling    Facial Swelling   Toujeo  Solostar [Insulin  Glargine] Shortness Of Breath   Fosamax  [Alendronate ] Other (See Comments)    Nausea, indigestion, stomach pain    Lactose Intolerance (Gi)     GI   Magnesium -Containing Compounds Swelling and Other (See Comments)    limbs   Adhesive [Tape] Rash and Other (See Comments)    Band aids   Family History  Problem Relation Age of Onset   Diabetes Maternal Grandmother    Heart failure Maternal Grandmother    COPD Maternal Grandmother        smoker   Thyroid  disease Maternal Grandmother    Vascular Disease Maternal Grandmother    Colon cancer Maternal Grandfather    Colon polyps Mother    Breast cancer Other    CAD Brother 91       stent   Esophageal cancer Neg Hx    Rectal cancer Neg Hx    Stomach cancer Neg Hx    PE: There were no vitals taken for this visit.   Wt Readings from Last 10 Encounters:  09/06/24 134 lb 9.6 oz (61.1 kg)  04/15/24 137 lb 12.8 oz (62.5 kg)  03/15/24 135 lb (61.2 kg)  03/10/24 136 lb (61.7 kg)  01/14/24 135 lb 6.4 oz (61.4 kg)  11/24/23 133 lb (60.3 kg)  10/17/23 135 lb 6 oz (61.4 kg)  10/16/23 135  lb 6.4 oz (61.4 kg)  10/13/23 134 lb 6.4 oz (61 kg)  09/24/23 131 lb 6 oz (59.6 kg)   Constitutional: in NAD  The physical exam was not performed (virtual visit).  ASSESSMENT: 1. DM1, uncontrolled, with complications - CAD - s/p 5v CABG - 02/21/2020 - Mild DR - Gastroparesis - This is the reason why she is on regular insulin , and she is taking it 10 minutes  rather than 30 minutes before eating  She had low CBGs with Novolog !  2. HL  PLAN:  1.  Patient with difficult to control type 1 diabetes, on basal-bolus insulin  regimen with intermediate acting insulin  and short acting insulin .  She was previously on an insulin  pump but she felt that her sugars are higher on the OmniPod 5 pump and stopped in 02/2022.  She had another insulin  pump more  than 10 years prior to this, but she did not feel that the sugars improved on that system, either.  She prefers to manage her blood sugars without an insulin  pump for now. - Sugars were definitely higher overnight for her in the past but upon questioning, she was using less NPH than recommended at that time.  We gradually increase this and sugars overnight improved.  We also discussed about possibly using the upper thighs and even buttocks for insulin  injections as she probably had scar tissue on the abdomen due to many years of insulin  injections. We previously also discussed about possible reasons for sugar fluctuations during the day including variable absorption rates, dehydration, stress.  We discussed that she was not a candidate for the V-Go or CeQur pump due to the fact that she requires small amounts of insulin .   -At last visit, reviewing the CGM trends, sugars appears to have improved, with less lows and hyperglycemic spikes throughout.  However, she started to see higher blood sugars after breakfast despite eating a lower carb meal.  She was bolusing 3 units for approximately 15 g of carbs in the morning and then corrects at least twice with  sliding scale.  We discussed about reducing the amount of sliding scale she was using later in the morning to half but I also recommended to add a low-dose of NPH in the morning, mixed with the regular insulin  before breakfast.  She felt that some of her problems were related to slow absorption from the injection sites and she started to rotate the sites more before last visit. CGM interpretation: -At today's visit, we reviewed her CGM downloads: It appears that 75% of values are in target range (goal >70%), while 22% are higher than 180 (goal <25%), and 3% are lower than 70 (goal <4%).  The calculated average blood sugar is 142.  The projected HbA1c for the next 3 months (GMI) is 6.7%. -Reviewing the CGM trends, sugars appears to be fluctuating mainly within the target range except for higher blood sugars after some of her lunches, followed by drops in blood sugars in the early evening, some of them on the 50s.  Upon questioning, she is not bolusing regular insulin   30 minutes before her meals, mostly due to gastroparesis, only 10-15 min before, but it does look like she will need to start doing so.  I advised her to start doing so to eliminate the hyperglycemic peaks after lunch and dinner.  It is most likely that her sugars are dropping late after lunch due to the short lag time between the injections and the but it is also possible that they are dropping due to repeated correction micro boluses.  We discussed that if not having the insulin  injections 30 minutes before meals is not enough to help with higher blood sugars after meals and prevent the drops in blood sugars later in the afternoon, she may need to strengthen the insulin  to carb ratio for lunch.  I would not suggest to strengthen the ICR for dinner as I suspect that the higher blood sugars after dinner are either due to not bolusing 30 minutes before this meal or, most likely, due to correction of the previous lows. -At today's visit, she inquires  about the mobi tubeless pump -this will be out next year. She is reticent to try this mostly due to occasional inaccuracy of the Dexcom sensor.  We decided to wait until next year, when the Dexcom 15 days will be out  to see if this would be more accurate for her. -I advised her to Patient Instructions  Please continue: - NPH insulin  10 units in the evening and 3 units in am - R insulin : B'fast: 5:1 (if going for a walk: 10:1) Lunch: 15:1 (try bolusing 30 min before the meal, and may need to change the ICR to 10-12:1) Dinner: 25:1  - target 150 - insulin  sensitivity factor (ISF) 60: 150-200: + 1 unit 201-260: + 2 units 261-320: + 3 units 321-380: + 4 units >380: + 5 units Use half of these amounts if you have to keep correcting. Do not correct bedtime sugars <300, and only inject 1 unit then.  For the snack at night, do not take more than 1-2 units of regular insulin !   Please return in 4-6 months.  - we reviewed her most recent HbA1c obtained last month, which was higher, at 6.9%. - advised to check sugars at different times of the day - 4x a day, rotating check times - advised for yearly eye exams >> she is UTD - return to clinic in 4-6 months     2. HL This lipid panel showed an LDL above our target of less than 55 and slightly elevated triglycerides: Lab Results  Component Value Date   CHOL 155 08/31/2024   HDL 45.80 08/31/2024   LDLCALC 77 08/31/2024   LDLDIRECT 120.0 06/17/2019   TRIG 163.0 (H) 08/31/2024   CHOLHDL 3 08/31/2024  - She continues on Repatha  and fish oil without side effects  Lela Fendt, MD PhD [facility] Endocrinology

## 2024-09-20 NOTE — Patient Instructions (Addendum)
 Please continue: - NPH insulin  10 units in the evening and 3 units in am - R insulin : B'fast: 5:1 (if going for a walk: 10:1) Lunch: 15:1 (try bolusing 30 min before the meal, and may need to change the ICR to 10-12:1) Dinner: 25:1  - target 150 - insulin  sensitivity factor (ISF) 60: 150-200: + 1 unit 201-260: + 2 units 261-320: + 3 units 321-380: + 4 units >380: + 5 units Use half of these amounts if you have to keep correcting. Do not correct bedtime sugars <300, and only inject 1 unit then.  For the snack at night, do not take more than 1-2 units of regular insulin !   Please return in 4-6 months.

## 2024-09-22 DIAGNOSIS — Z713 Dietary counseling and surveillance: Secondary | ICD-10-CM | POA: Diagnosis not present

## 2024-09-29 ENCOUNTER — Encounter: Payer: Self-pay | Admitting: Family Medicine

## 2024-09-29 ENCOUNTER — Ambulatory Visit (INDEPENDENT_AMBULATORY_CARE_PROVIDER_SITE_OTHER): Admitting: Family Medicine

## 2024-09-29 VITALS — BP 160/60 | HR 56 | Temp 98.3°F | Ht 62.5 in | Wt 134.5 lb

## 2024-09-29 DIAGNOSIS — E785 Hyperlipidemia, unspecified: Secondary | ICD-10-CM

## 2024-09-29 DIAGNOSIS — N183 Chronic kidney disease, stage 3 unspecified: Secondary | ICD-10-CM

## 2024-09-29 DIAGNOSIS — Z0000 Encounter for general adult medical examination without abnormal findings: Secondary | ICD-10-CM | POA: Diagnosis not present

## 2024-09-29 DIAGNOSIS — E1022 Type 1 diabetes mellitus with diabetic chronic kidney disease: Secondary | ICD-10-CM | POA: Diagnosis not present

## 2024-09-29 DIAGNOSIS — M791 Myalgia, unspecified site: Secondary | ICD-10-CM

## 2024-09-29 DIAGNOSIS — T466X5A Adverse effect of antihyperlipidemic and antiarteriosclerotic drugs, initial encounter: Secondary | ICD-10-CM

## 2024-09-29 DIAGNOSIS — E1069 Type 1 diabetes mellitus with other specified complication: Secondary | ICD-10-CM

## 2024-09-29 DIAGNOSIS — R030 Elevated blood-pressure reading, without diagnosis of hypertension: Secondary | ICD-10-CM

## 2024-09-29 DIAGNOSIS — N3941 Urge incontinence: Secondary | ICD-10-CM

## 2024-09-29 DIAGNOSIS — I25708 Atherosclerosis of coronary artery bypass graft(s), unspecified, with other forms of angina pectoris: Secondary | ICD-10-CM

## 2024-09-29 DIAGNOSIS — E1059 Type 1 diabetes mellitus with other circulatory complications: Secondary | ICD-10-CM

## 2024-09-29 DIAGNOSIS — E10319 Type 1 diabetes mellitus with unspecified diabetic retinopathy without macular edema: Secondary | ICD-10-CM

## 2024-09-29 DIAGNOSIS — K2100 Gastro-esophageal reflux disease with esophagitis, without bleeding: Secondary | ICD-10-CM

## 2024-09-29 DIAGNOSIS — M810 Age-related osteoporosis without current pathological fracture: Secondary | ICD-10-CM

## 2024-09-29 DIAGNOSIS — K3184 Gastroparesis: Secondary | ICD-10-CM

## 2024-09-29 DIAGNOSIS — R011 Cardiac murmur, unspecified: Secondary | ICD-10-CM

## 2024-09-29 DIAGNOSIS — E1043 Type 1 diabetes mellitus with diabetic autonomic (poly)neuropathy: Secondary | ICD-10-CM

## 2024-09-29 MED ORDER — CALCIUM-VITAMIN D-VITAMIN K 500-100-40 MG-UNT-MCG PO CHEW: 1.0000 | CHEWABLE_TABLET | Freq: Every day | ORAL | Status: AC

## 2024-09-29 NOTE — Assessment & Plan Note (Signed)
 Appreciate cardiology care.  On repatha , aspirin , imdur , toprol  XL.

## 2024-09-29 NOTE — Patient Instructions (Addendum)
 Look into prolia  option for osteoporosis, check with Dr Trixie on her recommendations for osteoporosis medication. Continue good dietary calcium  intake, regular vitamin D , regular weight bearing exercises.  Continue monitoring BP at home, let us  know if consistently >130/80. Bring me BP log to update chart.  Good to see you today Return as needed or in 6 months for follow up visit with labs

## 2024-09-29 NOTE — Assessment & Plan Note (Signed)
 Preventative protocols reviewed and updated unless pt declined. Discussed healthy diet and lifestyle.

## 2024-09-29 NOTE — Progress Notes (Signed)
 Ph: [phone] Fax: [phone]   Patient ID: Felicia [name] Search, female    DOB: [birth year], 63 y.o.   MRN: [id number]  This visit was conducted in person.  BP (!) 160/60 (BP Location: Right Arm, Patient Position: Sitting, Cuff Size: Normal)   Pulse (!) 56   Temp 98.3 F (36.8 C) (Oral)   Ht 5' 2.5 (1.588 m)   Wt 134 lb 8 oz (61 kg)   SpO2 100%   BMI 24.21 kg/m   BP Readings from Last 3 Encounters:  09/29/24 (!) 160/60  09/06/24 132/71  04/15/24 120/70  Elevated on repeat testing   CC: CPE Subjective:   HPI: Felicia [name] is a 63 y.o. female presenting on 09/29/2024 for Annual Exam    T1DM - followed by [facility]in[facility] Geralynn). On NPH 10u in evening and 3u in am, with R insulin  mealtime correction + SSI. Difficulty using insulin  pump due to hypoglycemia. Uses Dexcom G7.   CKD stage 3a - she's stared seeing nutritionist that specializes in diabetes and CKD. Moving to plant based diet.   White coat hypertension - recent BP at home 91/58. Normally 110-120s/50-60s. Previous hypotension limited medications.   CAD s/p CABG with early graft failure of all 3 vein grafts - sees Dr Francyne. Continues repatha , recent addition of zetia  (intolerant to statins). Long acting nitrates caused headache. Ranexa  not tolerated either. Repeat L heart catheterization showing severe 3v CAD s/p 4v CABG with 1/4 patent grafts. Severe mid-LAD stenosis - rec continued aggressive medical management.   Urinary urge incontinence - recent switch from tolterodine  (constipation) to myrbetriq . She also stopped myrbetriq  due to constipation.   Osteoporosis - she had trouble tolerating fosamax . She has been hesitant for other medication.   Preventative: COLONOSCOPY 07/2016 - WNL (Pyrtle) Mammogram with ROSARIO Flowers, 09/2023 [facility] exam - with Dr Flowers (retired) --> new MD scheduled next week OBGYN  DEXA 09/2022 - T -3.7 TRF, -2.2 spine. Unable to take oral bisphosphonate due to  gastroparesis. Declines Prolia  vs Reclast. Lactose intolerant - takes 3 calcium  supplement daily. Regular weight bearing exercise. Lung cancer screen - not eligible  Flu shot - yearly  COVID vaccine - Pfizer 02/2020, 05/2020, no booster  Tdap 08/2011. Due for rpt  Pneumovax-23 2004. Prevnar-20 06/2021 Shingrix - 09/2022, 02/2023 Advanced directive discussion -  Seat belt use discussed Sunscreen use discussed. No changing moles on skin.  Non smoker  Alcohol - none  Dentist - q6 mo  Eye exam - sees Bulakowski upcoming next month Bowel - no constipation  Bladder - intermittent urge incontinence, not too bothersome at this time. No stress incontinence symptoms. Stopped myrbetriq  and tolterodine  due to constipation.   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 Interim medical history since our last visit reviewed. Allergies and medications reviewed and updated. Outpatient Medications Prior to Visit  Medication Sig Dispense Refill   aspirin  81 MG EC tablet Take 1 tablet (81 mg total) by mouth daily.     B-D INSULIN  SYRINGE 31G X 5/16 0.3 ML MISC INJECT INSULIN  UP TO 5 TIMES DAILY 500 each 3   Continuous Glucose Sensor (DEXCOM G7 SENSOR) MISC CHANGE SENSOR EVERY 10 DAYS 9 each 1   esomeprazole  (NEXIUM ) 20 MG capsule Take 40  mg by mouth daily.     Evolocumab  (REPATHA  SURECLICK) 140 MG/ML SOAJ INJECT 140 MG INTO THE SKIN EVERY 14 (FOURTEEN) DAYS. 6 mL 3   Glucagon  3 MG/DOSE POWD Place 3 mg into the nose once as needed for up to 1 dose. 1 each 11   HUMULIN R  100 UNIT/ML injection INJECT 0.01-0.06 MLS (1-6 UNITS TOTAL) INTO THE SKIN 3 (THREE) TIMES DAILY BEFORE MEALS. AS INSTRUCTED PER SLIDING SCALE 90 mL 1   insulin  NPH Human (HUMULIN N) 100 UNIT/ML injection  INJECT UP TO 14 UNITS A DAY UNDER SKIN AS ADVISED 20 mL 11   isosorbide  mononitrate (IMDUR ) 30 MG 24 hr tablet TAKE 1 TABLET BY MOUTH EVERY DAY 90 tablet 1   metoprolol  succinate (TOPROL -XL) 25 MG 24 hr tablet TAKE 1 TABLET (25 MG TOTAL) BY MOUTH DAILY. 90 tablet 3   Multiple Vitamin (MULTIVITAMIN) tablet Take 1 tablet by mouth daily.     nitroGLYCERIN  (NITROSTAT ) 0.4 MG SL tablet Place 1 tablet (0.4 mg total) under the tongue every 5 (five) minutes as needed for chest pain. 25 tablet 3   Omega-3 Fatty Acids (FISH OIL) 500 MG CAPS Take 500 mg by mouth daily.     calcium  carbonate (CALCIUM  600) 600 MG TABS tablet Take 1 tablet (600 mg total) by mouth daily with breakfast.     No facility-administered medications prior to visit.     Per HPI unless specifically indicated in ROS section below Review of Systems  Constitutional:  Positive for appetite change (decreased). Negative for activity change, chills, fatigue, fever and unexpected weight change.  HENT:  Negative for hearing loss.   Eyes:  Negative for visual disturbance.  Respiratory:  Positive for chest tightness (yesterday x1, treated with aspirin ). Negative for cough, shortness of breath and wheezing.   Cardiovascular:  Negative for chest pain, palpitations and leg swelling.  Gastrointestinal:  Negative for abdominal distention, abdominal pain, blood in stool, constipation, diarrhea, nausea and vomiting.  Genitourinary:  Negative for difficulty urinating and hematuria.  Musculoskeletal:  Negative for arthralgias, myalgias and neck pain.       Leg cramping , no claudication  Skin:  Negative for rash.  Neurological:  Positive for dizziness (hypotension related). Negative for seizures, syncope and headaches.  Hematological:  Negative for adenopathy. Bruises/bleeds easily.  Psychiatric/Behavioral:  Negative for dysphoric mood. The patient is nervous/anxious.     Objective:  BP (!) 160/60 (BP Location: Right Arm, Patient Position:  Sitting, Cuff Size: Normal)   Pulse (!) 56   Temp 98.3 F (36.8 C) (Oral)   Ht 5' 2.5 (1.588 m)   Wt 134 lb 8 oz (61 kg)   SpO2 100%   BMI 24.21 kg/m   Wt Readings from Last 3 Encounters:  09/29/24 134 lb 8 oz (61 kg)  09/20/24 132 lb (59.9 kg)  09/06/24 134 lb 9.6 oz (61.1 kg)      Physical Exam Vitals and nursing note reviewed.  Constitutional:      Appearance: Normal appearance. She is not ill-appearing.  HENT:     Head: Normocephalic and atraumatic.     Right Ear: Tympanic membrane, ear canal and external ear normal. There is no impacted cerumen.     Left Ear: Tympanic membrane, ear canal and external ear normal. There is no impacted cerumen.     Mouth/Throat:     Mouth: Mucous membranes are moist.     Pharynx: Oropharynx is clear. No oropharyngeal exudate or posterior oropharyngeal erythema.  Eyes:  General:        Right eye: No discharge.        Left eye: No discharge.     Extraocular Movements: Extraocular movements intact.     Conjunctiva/sclera: Conjunctivae normal.     Pupils: Pupils are equal, round, and reactive to light.  Neck:     Thyroid : No thyroid  mass or thyromegaly.  Cardiovascular:     Rate and Rhythm: Normal rate and regular rhythm.     Pulses: Normal pulses.     Heart sounds: Normal heart sounds. No murmur heard. Pulmonary:     Effort: Pulmonary effort is normal. No respiratory distress.     Breath sounds: Normal breath sounds. No wheezing, rhonchi or rales.  Abdominal:     General: Bowel sounds are normal. There is no distension.     Palpations: Abdomen is soft. There is no mass.     Tenderness: There is no abdominal tenderness. There is no guarding or rebound.     Hernia: No hernia is present.  Musculoskeletal:     Cervical back: Normal range of motion and neck supple. No rigidity.     Right lower leg: No edema.     Left lower leg: No edema.  Lymphadenopathy:     Cervical: No cervical adenopathy.  Skin:    General: Skin is warm and  dry.     Findings: No rash.  Neurological:     General: No focal deficit present.     Mental Status: She is alert. Mental status is at baseline.  Psychiatric:        Mood and Affect: Mood normal.        Behavior: Behavior normal.       Results for orders placed or performed in visit on 08/31/24  TSH   Collection Time: 08/31/24  8:31 AM  Result Value Ref Range   TSH 2.27 0.35 - 5.50 uIU/mL  Vitamin B12   Collection Time: 08/31/24  8:31 AM  Result Value Ref Range   Vitamin B-12 845 211 - 911 pg/mL  CBC with Differential/Platelet   Collection Time: 08/31/24  8:31 AM  Result Value Ref Range   WBC 4.4 4.0 - 10.5 K/uL   RBC 4.15 3.87 - 5.11 Mil/uL   Hemoglobin 12.5 12.0 - 15.0 g/dL   HCT 62.5 63.9 - 53.9 %   MCV 90.0 78.0 - 100.0 fl   MCHC 33.3 30.0 - 36.0 g/dL   RDW 85.7 88.4 - 84.4 %   Platelets 232.0 150.0 - 400.0 K/uL   Neutrophils Relative % 50.6 43.0 - 77.0 %   Lymphocytes Relative 35.4 12.0 - 46.0 %   Monocytes Relative 8.3 3.0 - 12.0 %   Eosinophils Relative 4.6 0.0 - 5.0 %   Basophils Relative 1.1 0.0 - 3.0 %   Neutro Abs 2.2 1.4 - 7.7 K/uL   Lymphs Abs 1.5 0.7 - 4.0 K/uL   Monocytes Absolute 0.4 0.1 - 1.0 K/uL   Eosinophils Absolute 0.2 0.0 - 0.7 K/uL   Basophils Absolute 0.0 0.0 - 0.1 K/uL  Parathyroid  hormone, intact (no Ca)   Collection Time: 08/31/24  8:31 AM  Result Value Ref Range   PTH 43 16 - 77 pg/mL  VITAMIN D  25 Hydroxy (Vit-D Deficiency, Fractures)   Collection Time: 08/31/24  8:31 AM  Result Value Ref Range   VITD 61.24 30.00 - 100.00 ng/mL  Phosphorus   Collection Time: 08/31/24  8:31 AM  Result Value Ref Range   Phosphorus 3.7  2.3 - 4.6 mg/dL  Comprehensive metabolic panel with GFR   Collection Time: 08/31/24  8:31 AM  Result Value Ref Range   Sodium 138 135 - 145 mEq/L   Potassium 4.5 3.5 - 5.1 mEq/L   Chloride 102 96 - 112 mEq/L   CO2 29 19 - 32 mEq/L   Glucose, Bld 160 (H) 70 - 99 mg/dL   BUN 13 6 - 23 mg/dL   Creatinine, Ser 8.93  0.40 - 1.20 mg/dL   Total Bilirubin 0.4 0.2 - 1.2 mg/dL   Alkaline Phosphatase 55 39 - 117 U/L   AST 22 0 - 37 U/L   ALT 15 0 - 35 U/L   Total Protein 6.4 6.0 - 8.3 g/dL   Albumin  4.0 3.5 - 5.2 g/dL   GFR 43.30 (L) >39.99 mL/min   Calcium  8.9 8.4 - 10.5 mg/dL  Lipid panel   Collection Time: 08/31/24  8:31 AM  Result Value Ref Range   Cholesterol 155 0 - 200 mg/dL   Triglycerides 836.9 (H) 0.0 - 149.0 mg/dL   HDL 54.19 >60.99 mg/dL   VLDL 67.3 0.0 - 59.9 mg/dL   LDL Cholesterol 77 0 - 99 mg/dL   Total CHOL/HDL Ratio 3    NonHDL 109.19   Microalbumin / creatinine urine ratio   Collection Time: 08/31/24  8:31 AM  Result Value Ref Range   Microalb, Ur 2.3 (H) 0.0 - 1.9 mg/dL   Creatinine,U 36.6 mg/dL   Microalb Creat Ratio 36.4 (H) 0.0 - 30.0 mg/g  Hemoglobin A1c   Collection Time: 08/31/24  8:31 AM  Result Value Ref Range   Hgb A1c MFr Bld 6.9 (H) 4.6 - 6.5 %    Assessment & Plan:   Problem List Items Addressed This Visit     Health maintenance examination - Primary (Chronic)   Preventative protocols reviewed and updated unless pt declined. Discussed healthy diet and lifestyle.       Type 1 diabetes mellitus with circulatory complication, with long-term current use of insulin  (HCC)   Reguarly sees endo, on insulin  therapy.  She has started seeing nutritionist.       Hyperlipidemia due to type 1 diabetes mellitus (HCC)   Chronic.  Statin intolerance Continue repatha .  The ASCVD Risk score (Arnett DK, et al., 2019) failed to calculate for the following reasons:   Risk score cannot be calculated because patient has a medical history suggesting prior/existing ASCVD       Diabetic gastroparesis associated with type 1 diabetes mellitus (HCC)   H/o this She continues nexium  20mg  2 tab daily  Avoid oral bisphosphonates      Systolic murmur   Mild MR on echo 2021      Reflux esophagitis   Continue nexium  40mg  daily.       CAD (coronary artery disease)    Appreciate cardiology care.  On repatha , aspirin , imdur , toprol  XL.       Urge urinary incontinence   Intolerant to tolterodine , mirabegron  (constipation) Now off medication.       CKD stage 3 due to type 1 diabetes mellitus (HCC)   Reviewed with patient. She remains concerned about possible progression of CKD. She has started seeing nutritionist.  Only with mild microalbuminuria. Avoiding SGLT2i in T1DM.  Historically, low BPs limit ACEI/ARB use. Recommend Q50mo labwork monitoring, pt agrees       Relevant Orders   Renal function panel   Magnesium    Hemoglobin A1c   Diabetic retinopathy associated with type  1 diabetes mellitus (HCC)   Sees [facility] regularly - upcoming appt next month.       Osteoporosis   Marked osteoporosis based on DEXA 09/2022  She was intolerant of oral fosamax  and wants to avoid long acting bisphosphonate infusion  Avoid Evista or estrogenic medication in cardiovascular history.  Consider prolia  - handout provided. I believe she was previously on this medication for a short period, unsure why stopped.       Relevant Medications   Calcium -Vitamin D -Vitamin K  500-100-40 MG-UNT-MCG CHEW   Myalgia due to statin   Has previously tried pravastatin , lovastatin  with poor effect Was previously also on zetia . Now on repatha .      Elevated blood pressure reading in office with white coat syndrome, without diagnosis of hypertension   Home readings have all been well controlled - anticipate component of white coat hypertension. Did not titrate antihypertensives in h/o historically low blood pressures and h/o orthostatic syncope with fracture.  She will continue monitoring blood pressures at home and let us  know if persistently running >130/80.          Meds ordered this encounter  Medications   Calcium -Vitamin D -Vitamin K  500-100-40 MG-UNT-MCG CHEW    Sig: Chew 1 tablet by mouth daily.    Orders Placed This Encounter  Procedures   Renal function  panel    Standing Status:   Future    Expiration Date:   09/29/2025   Magnesium     Standing Status:   Future    Expiration Date:   09/29/2025   Hemoglobin A1c    Standing Status:   Future    Expiration Date:   09/29/2025    Patient Instructions  Look into prolia  option for osteoporosis, check with Dr Trixie on her recommendations for osteoporosis medication. Continue good dietary calcium  intake, regular vitamin D , regular weight bearing exercises.  Continue monitoring BP at home, let us  know if consistently >130/80. Bring me BP log to update chart.  Good to see you today Return as needed or in 6 months for follow up visit with labs   Follow up plan: Return in about 6 months (around 03/29/2025) for follow up visit.  Anton Blas, MD

## 2024-10-01 DIAGNOSIS — R030 Elevated blood-pressure reading, without diagnosis of hypertension: Secondary | ICD-10-CM | POA: Insufficient documentation

## 2024-10-01 NOTE — Assessment & Plan Note (Signed)
 Home readings have all been well controlled - anticipate component of white coat hypertension. Did not titrate antihypertensives in h/o historically low blood pressures and h/o orthostatic syncope with fracture.  She will continue monitoring blood pressures at home and let us  know if persistently running >130/80.

## 2024-10-01 NOTE — Assessment & Plan Note (Signed)
Continue nexium 40 mg daily.  

## 2024-10-01 NOTE — Assessment & Plan Note (Addendum)
 H/o this She continues nexium  20mg  2 tab daily  Avoid oral bisphosphonates

## 2024-10-01 NOTE — Assessment & Plan Note (Signed)
 Sees [facility] regularly - upcoming appt next month.

## 2024-10-01 NOTE — Assessment & Plan Note (Signed)
 Has previously tried pravastatin , lovastatin  with poor effect Was previously also on zetia . Now on repatha .

## 2024-10-01 NOTE — Assessment & Plan Note (Signed)
 Reviewed with patient. She remains concerned about possible progression of CKD. She has started seeing nutritionist.  Only with mild microalbuminuria. Avoiding SGLT2i in T1DM.  Historically, low BPs limit ACEI/ARB use. Recommend Q18mo labwork monitoring, pt agrees

## 2024-10-01 NOTE — Assessment & Plan Note (Signed)
 Mild MR on echo 2021

## 2024-10-01 NOTE — Assessment & Plan Note (Signed)
 Chronic.  Statin intolerance Continue repatha .  The ASCVD Risk score (Arnett DK, et al., 2019) failed to calculate for the following reasons:   Risk score cannot be calculated because patient has a medical history suggesting prior/existing ASCVD

## 2024-10-01 NOTE — Assessment & Plan Note (Signed)
 Marked osteoporosis based on DEXA 09/2022  She was intolerant of oral fosamax  and wants to avoid long acting bisphosphonate infusion  Avoid Evista or estrogenic medication in cardiovascular history.  Consider prolia  - handout provided. I believe she was previously on this medication for a short period, unsure why stopped.

## 2024-10-01 NOTE — Assessment & Plan Note (Addendum)
 Reguarly sees endo, on insulin  therapy.  She has started seeing nutritionist.

## 2024-10-01 NOTE — Assessment & Plan Note (Signed)
 Intolerant to tolterodine , mirabegron  (constipation) Now off medication.

## 2024-10-04 ENCOUNTER — Telehealth: Admitting: Internal Medicine

## 2024-10-04 ENCOUNTER — Ambulatory Visit: Admitting: Internal Medicine

## 2024-10-05 ENCOUNTER — Other Ambulatory Visit: Payer: Self-pay | Admitting: Internal Medicine

## 2024-10-06 DIAGNOSIS — Z1231 Encounter for screening mammogram for malignant neoplasm of breast: Secondary | ICD-10-CM | POA: Diagnosis not present

## 2024-10-06 DIAGNOSIS — Z01419 Encounter for gynecological examination (general) (routine) without abnormal findings: Secondary | ICD-10-CM | POA: Diagnosis not present

## 2024-10-06 DIAGNOSIS — Z1331 Encounter for screening for depression: Secondary | ICD-10-CM | POA: Diagnosis not present

## 2024-10-06 LAB — HM MAMMOGRAPHY

## 2024-10-08 DIAGNOSIS — Z713 Dietary counseling and surveillance: Secondary | ICD-10-CM | POA: Diagnosis not present

## 2024-10-11 ENCOUNTER — Ambulatory Visit: Payer: Self-pay | Admitting: Family Medicine

## 2024-10-26 DIAGNOSIS — H5212 Myopia, left eye: Secondary | ICD-10-CM | POA: Diagnosis not present

## 2024-10-26 DIAGNOSIS — E103293 Type 1 diabetes mellitus with mild nonproliferative diabetic retinopathy without macular edema, bilateral: Secondary | ICD-10-CM | POA: Diagnosis not present

## 2024-10-26 LAB — OPHTHALMOLOGY REPORT-SCANNED

## 2024-11-09 ENCOUNTER — Encounter: Payer: Self-pay | Admitting: Internal Medicine

## 2024-11-09 MED ORDER — "BD SAFETYGLIDE INSULIN SYRINGE 31G X 5/16"" 0.3 ML MISC": 3 refills | Status: DC

## 2024-11-09 MED ORDER — "INSULIN SYRINGE-NEEDLE U-100 31G X 5/16"" 0.3 ML MISC": 3 refills | Status: AC

## 2024-11-09 NOTE — Addendum Note (Signed)
 Addended by: CLEOTILDE ROLIN RAMAN on: 11/09/2024 10:17 AM   Modules accepted: Orders

## 2024-11-10 ENCOUNTER — Encounter: Payer: Self-pay | Admitting: Cardiovascular Disease

## 2024-11-29 ENCOUNTER — Other Ambulatory Visit: Payer: Self-pay | Admitting: Family Medicine

## 2024-11-29 DIAGNOSIS — E1069 Type 1 diabetes mellitus with other specified complication: Secondary | ICD-10-CM

## 2024-12-01 ENCOUNTER — Other Ambulatory Visit

## 2024-12-07 ENCOUNTER — Other Ambulatory Visit

## 2024-12-15 ENCOUNTER — Ambulatory Visit: Admitting: Family Medicine

## 2024-12-17 ENCOUNTER — Other Ambulatory Visit

## 2025-03-11 ENCOUNTER — Ambulatory Visit: Admitting: Internal Medicine

## 2025-03-22 ENCOUNTER — Other Ambulatory Visit

## 2025-03-29 ENCOUNTER — Ambulatory Visit: Admitting: Family Medicine

## 2025-04-13 ENCOUNTER — Ambulatory Visit: Admitting: Cardiovascular Disease
